# Patient Record
Sex: Male | Born: 1965
Health system: Southern US, Community
[De-identification: ages and names within clinical notes are randomized; demographics above are authoritative.]

## PROBLEM LIST (undated history)

## (undated) ENCOUNTER — Emergency Department (HOSPITAL_COMMUNITY)

## (undated) DIAGNOSIS — M1990 Unspecified osteoarthritis, unspecified site: Secondary | ICD-10-CM

## (undated) DIAGNOSIS — I509 Heart failure, unspecified: Secondary | ICD-10-CM

## (undated) DIAGNOSIS — F209 Schizophrenia, unspecified: Secondary | ICD-10-CM

## (undated) DIAGNOSIS — I10 Essential (primary) hypertension: Secondary | ICD-10-CM

## (undated) DIAGNOSIS — F319 Bipolar disorder, unspecified: Secondary | ICD-10-CM

## (undated) DIAGNOSIS — E669 Obesity, unspecified: Secondary | ICD-10-CM

## (undated) DIAGNOSIS — H3410 Central retinal artery occlusion, unspecified eye: Secondary | ICD-10-CM

## (undated) DIAGNOSIS — N189 Chronic kidney disease, unspecified: Secondary | ICD-10-CM

## (undated) HISTORY — DX: Heart failure, unspecified: I50.9

## (undated) HISTORY — DX: Unspecified osteoarthritis, unspecified site: M19.90

## (undated) HISTORY — DX: Obesity, unspecified: E66.9

## (undated) HISTORY — PX: OTHER SURGICAL HISTORY: SHX169

## (undated) HISTORY — DX: Essential (primary) hypertension: I10

## (undated) HISTORY — PX: TOTAL HIP ARTHROPLASTY: SHX124

## (undated) HISTORY — DX: Central retinal artery occlusion, unspecified eye: H34.10

## (undated) HISTORY — PX: JOINT REPLACEMENT: SHX530

---

## 1999-12-24 ENCOUNTER — Encounter: Payer: Self-pay | Admitting: Orthopedic Surgery

## 1999-12-27 ENCOUNTER — Encounter: Payer: Self-pay | Admitting: Orthopedic Surgery

## 1999-12-28 ENCOUNTER — Inpatient Hospital Stay (HOSPITAL_COMMUNITY): Admission: RE | Admit: 1999-12-28 | Discharge: 1999-12-30 | Payer: Self-pay | Admitting: Orthopaedic Surgery

## 2004-11-02 ENCOUNTER — Ambulatory Visit (HOSPITAL_COMMUNITY): Admission: RE | Admit: 2004-11-02 | Discharge: 2004-11-02 | Payer: Self-pay | Admitting: Ophthalmology

## 2004-11-29 ENCOUNTER — Ambulatory Visit (HOSPITAL_COMMUNITY): Admission: RE | Admit: 2004-11-29 | Discharge: 2004-11-30 | Payer: Self-pay | Admitting: Ophthalmology

## 2004-12-13 ENCOUNTER — Emergency Department (HOSPITAL_COMMUNITY): Admission: EM | Admit: 2004-12-13 | Discharge: 2004-12-14 | Payer: Self-pay | Admitting: Emergency Medicine

## 2007-11-14 ENCOUNTER — Emergency Department (HOSPITAL_COMMUNITY): Admission: EM | Admit: 2007-11-14 | Discharge: 2007-11-14 | Payer: Self-pay | Admitting: Emergency Medicine

## 2008-12-15 ENCOUNTER — Inpatient Hospital Stay (HOSPITAL_COMMUNITY): Admission: EM | Admit: 2008-12-15 | Discharge: 2008-12-19 | Payer: Self-pay | Admitting: Emergency Medicine

## 2008-12-15 ENCOUNTER — Ambulatory Visit: Payer: Self-pay | Admitting: Internal Medicine

## 2008-12-16 ENCOUNTER — Encounter (INDEPENDENT_AMBULATORY_CARE_PROVIDER_SITE_OTHER): Payer: Self-pay | Admitting: Internal Medicine

## 2008-12-23 ENCOUNTER — Telehealth: Payer: Self-pay | Admitting: Internal Medicine

## 2008-12-29 ENCOUNTER — Telehealth: Payer: Self-pay | Admitting: Internal Medicine

## 2009-01-04 ENCOUNTER — Telehealth: Payer: Self-pay | Admitting: Internal Medicine

## 2009-01-12 ENCOUNTER — Encounter: Payer: Self-pay | Admitting: Nurse Practitioner

## 2009-01-12 ENCOUNTER — Ambulatory Visit: Payer: Self-pay | Admitting: Cardiovascular Disease

## 2009-01-12 DIAGNOSIS — I10 Essential (primary) hypertension: Secondary | ICD-10-CM

## 2009-01-12 DIAGNOSIS — E669 Obesity, unspecified: Secondary | ICD-10-CM | POA: Insufficient documentation

## 2009-01-12 DIAGNOSIS — I5040 Unspecified combined systolic (congestive) and diastolic (congestive) heart failure: Secondary | ICD-10-CM | POA: Insufficient documentation

## 2009-01-12 DIAGNOSIS — I509 Heart failure, unspecified: Secondary | ICD-10-CM | POA: Insufficient documentation

## 2009-01-12 DIAGNOSIS — M1990 Unspecified osteoarthritis, unspecified site: Secondary | ICD-10-CM | POA: Insufficient documentation

## 2009-01-12 LAB — CONVERTED CEMR LAB
BUN: 19 mg/dL (ref 6–23)
CO2: 34 meq/L — ABNORMAL HIGH (ref 19–32)
Calcium: 9 mg/dL (ref 8.4–10.5)
Creatinine, Ser: 1.4 mg/dL (ref 0.4–1.5)
Glucose, Bld: 71 mg/dL (ref 70–99)

## 2009-01-19 ENCOUNTER — Ambulatory Visit: Payer: Self-pay | Admitting: Family Medicine

## 2009-02-14 ENCOUNTER — Ambulatory Visit: Payer: Self-pay | Admitting: Internal Medicine

## 2009-02-14 LAB — CONVERTED CEMR LAB
AST: 25 units/L (ref 0–37)
Albumin: 4.1 g/dL (ref 3.5–5.2)
BUN: 23 mg/dL (ref 6–23)
Calcium: 9.7 mg/dL (ref 8.4–10.5)
Creatinine, Ser: 1.7 mg/dL — ABNORMAL HIGH (ref 0.4–1.5)
GFR calc non Af Amer: 46.42 mL/min (ref >60)
Pro B Natriuretic peptide (BNP): 25 pg/mL (ref 0.0–100.0)

## 2009-02-27 ENCOUNTER — Ambulatory Visit: Payer: Self-pay | Admitting: Cardiology

## 2009-02-27 ENCOUNTER — Ambulatory Visit (HOSPITAL_COMMUNITY): Admission: RE | Admit: 2009-02-27 | Discharge: 2009-02-27 | Payer: Self-pay | Admitting: Internal Medicine

## 2009-02-27 ENCOUNTER — Telehealth: Payer: Self-pay | Admitting: Internal Medicine

## 2009-02-27 ENCOUNTER — Encounter: Payer: Self-pay | Admitting: Internal Medicine

## 2009-02-27 ENCOUNTER — Ambulatory Visit: Payer: Self-pay

## 2009-03-08 ENCOUNTER — Telehealth: Payer: Self-pay | Admitting: Internal Medicine

## 2009-03-21 ENCOUNTER — Ambulatory Visit: Payer: Self-pay | Admitting: Cardiology

## 2009-03-21 ENCOUNTER — Encounter (INDEPENDENT_AMBULATORY_CARE_PROVIDER_SITE_OTHER): Payer: Self-pay | Admitting: Nurse Practitioner

## 2009-03-21 LAB — CONVERTED CEMR LAB
Chloride: 107 meq/L (ref 96–112)
Creatinine, Ser: 1.5 mg/dL (ref 0.4–1.5)
Potassium: 3.9 meq/L (ref 3.5–5.1)
Pro B Natriuretic peptide (BNP): 37 pg/mL (ref 0.0–100.0)
Sodium: 143 meq/L (ref 135–145)

## 2009-04-11 ENCOUNTER — Ambulatory Visit: Payer: Self-pay | Admitting: Cardiology

## 2009-05-10 ENCOUNTER — Encounter (INDEPENDENT_AMBULATORY_CARE_PROVIDER_SITE_OTHER): Payer: Self-pay | Admitting: *Deleted

## 2009-07-05 ENCOUNTER — Ambulatory Visit: Payer: Self-pay | Admitting: Internal Medicine

## 2009-10-11 ENCOUNTER — Encounter (INDEPENDENT_AMBULATORY_CARE_PROVIDER_SITE_OTHER): Payer: Self-pay | Admitting: *Deleted

## 2010-06-05 NOTE — Letter (Signed)
Summary: Appointment - Missed  [facility], [place]  [address] [place]   [place], [place] [address]   Phone: [phone]  Fax: [phone]     October 11, 2009 MRN: [id number]   [place], [place]  [address]   Dear Mr. York,  Our records indicate you missed your appointment on 10/10/2009 with Dr. Haroldine Laws. It is very important that we reach you to reschedule this appointment. We look forward to participating in your health care needs. Please contact us at the number listed above at your earliest convenience to reschedule this appointment.     Sincerely,   [name] [facility] Scheduling Team

## 2010-06-05 NOTE — Assessment & Plan Note (Signed)
Summary: f71m/jss   Visit Type:  Follow-up Primary Provider:  [facility]  CC:  felling better.  History of Present Illness: Paul [name] is a 45 year old  male with h/o severe HTN, CRI, retinal stroke and CHF thought secondary to NICM due to HTN  Was admitted in August 2010 with acute HF. EF 20-25%. Agressively diuresed and BP controlled. Cath deferred due to CRI. Suggeste Myoview but patient wanted to defer due to insurance issues.   Echo in 10/10 EF 45%.   Returns for f/u. Doing very well. Remains very active. Continues to walk at [employer] for 25 minutes. Also doing some hiking. No CP/SOB.  No orthopnea/PND. Very compliant with meds. Occasional edema. Working for [employer]   Not watching diet as closely as before. Cheats occasionally. Weight up 5 pounds. Taking BP at home on occasion. BP 130-170/90-100.  BP here 132/90 manual by his cuff 146/96  Current Medications (verified): 1)  Losartan Potassium 50 Mg Tabs (Losartan Potassium) .... Take 1 Tablet By Mouth Once A Day 2)  Klor-Con M20 20 Meq Cr-Tabs (Potassium Chloride Crys Cr) .Marland Kitchen.. 1 By Mouth Daily 3)  Carvedilol 25 Mg Tabs (Carvedilol) .... Take One Tablet By Mouth Twice A Day 4)  Aspirin 81 Mg Tbec (Aspirin) .... Take One Tablet By Mouth Daily 5)  Furosemide 40 Mg Tabs (Furosemide) .... Take One Tablet By Mouth Daily. 6)  Gold Sodium Thiomalate 50 Mg/ml Soln (Gold Sodium Thiomalate) .... Three Times A Day  Allergies (verified): 1)  ! Penicillin  Past History:  Past Medical History: Last updated: 01/12/2009 1. Severe hypertension, uncontrolled.   2. Obesity.   3. History of combined central retinal artery occlusion with macular       infarction of the right eye.       a.     Status post posterior vitrectomy and photocoagulation with        insertion of a shunt in 2006.   4. Osteoarthritis status post right hip replacement in 2001.  5. Chronic Mixed Syst/Diast CHF      a. 12/2008 echo: EF 20-25%, global HK, Grade  II Diast dysfxn.     Review of Systems       As per HPI and past medical history; otherwise all systems negative.   Vital Signs:  Patient profile:   45 year old male Height:      64 inches Weight:      207 pounds BMI:     35.66 Pulse rate:   75 / minute BP sitting:   132 / 92  (left arm) Cuff size:   regular  Vitals Entered By: Mignon Pine, RMA (July 05, 2009 4:09 PM) CC: felling better Comments his BP cuff says 164/98   Physical Exam  General:  Gen: well appearing. no resp difficulty HEENT: normal except for opacification of R eye Neck: supple. no JVD. Carotids 2+ bilat; no bruits. No lymphadenopathy or thryomegaly appreciated. Cor: PMI nondisplaced. Regular rate & rhythm. No rubs, gallops, murmur. Lungs: clear Abdomen: soft, nontender, nondistended. No hepatosplenomegaly. No bruits or masses. Good bowel sounds. Extremities: no cyanosis, clubbing, rash, edema Neuro: alert & orientedx3, cranial nerves grossly intact. moves all 4 extremities w/o difficulty. affect pleasant    Impression & Recommendations:  Problem # 1:  HYPERTENSION (ICD-401.9) BP still elevated though probably a bit overestimated by his cuff. Increase losartan to 100 once daily. May benefit from spiro down the road as well.   Problem # 2:  UNSPEC COMBINED SYSTOLIC&DIASTOLIC  HEART FAILURE (ICD-428.40) Doing well. EF improving. NYHA class I. Continue current meds with increase of losartan. No role for ICD as EF > 35%.  Other Orders: EKG w/ Interpretation (93000)  Patient Instructions: 1)  Increase Losartan to 100mg  once daily  2)  Follow up in 3 months Prescriptions: LOSARTAN POTASSIUM 100 MG TABS (LOSARTAN POTASSIUM) Take 1 tablet by mouth once a day  #30 x 6   Entered by:   Kevan Rosebush, RN   Authorized by:   Jolaine Artist, MD, [facility]   Signed by:   Kevan Rosebush, RN on 07/05/2009   Method used:   Electronically to        [facility] (retail)       [phone]       [place], [place]  [address]       Ph: [phone]       Fax: [phone]   RxID:   [id number]

## 2010-06-05 NOTE — Letter (Signed)
Summary: Appointment - Reschedule  [place], [place]    Phone:   Fax:      May 10, 2009 MRN: [id number]   [address] [place], [place]  [address]   Dear Mr. Iannelli,   Due to a change in our office schedule, your appointment on  06/27/09  at  11:45           must be changed.  It is very important that we reach you to reschedule this appointment. We look forward to participating in your health care needs. Please contact us at the number listed above at your earliest convenience to reschedule this appointment.     Sincerely,    [facility] Scheduling Team

## 2010-08-11 LAB — LIPID PANEL
Cholesterol: 158 mg/dL (ref 0–200)
HDL: 33 mg/dL — ABNORMAL LOW (ref >39)
LDL Cholesterol: 86 mg/dL (ref 0–99)
Total CHOL/HDL Ratio: 4.8 RATIO
Triglycerides: 193 mg/dL — ABNORMAL HIGH (ref <150)

## 2010-08-11 LAB — BASIC METABOLIC PANEL
BUN: 19 mg/dL (ref 6–23)
CO2: 27 mEq/L (ref 19–32)
CO2: 25 mEq/L (ref 19–32)
CO2: 31 mEq/L (ref 19–32)
CO2: 33 mEq/L — ABNORMAL HIGH (ref 19–32)
CO2: 34 mEq/L — ABNORMAL HIGH (ref 19–32)
Calcium: 8.6 mg/dL (ref 8.4–10.5)
Calcium: 8.1 mg/dL — ABNORMAL LOW (ref 8.4–10.5)
Calcium: 8.3 mg/dL — ABNORMAL LOW (ref 8.4–10.5)
Calcium: 9 mg/dL (ref 8.4–10.5)
Chloride: 107 mEq/L (ref 96–112)
Chloride: 98 mEq/L (ref 96–112)
Chloride: 98 mEq/L (ref 96–112)
Creatinine, Ser: 1.69 mg/dL — ABNORMAL HIGH (ref 0.4–1.5)
Creatinine, Ser: 1.87 mg/dL — ABNORMAL HIGH (ref 0.4–1.5)
GFR calc Af Amer: 53 mL/min — ABNORMAL LOW (ref >60)
GFR calc Af Amer: 47 mL/min — ABNORMAL LOW (ref >60)
GFR calc Af Amer: 53 mL/min — ABNORMAL LOW (ref >60)
GFR calc non Af Amer: 44 mL/min — ABNORMAL LOW (ref >60)
GFR calc non Af Amer: 44 mL/min — ABNORMAL LOW (ref >60)
Glucose, Bld: 102 mg/dL — ABNORMAL HIGH (ref 70–99)
Glucose, Bld: 103 mg/dL — ABNORMAL HIGH (ref 70–99)
Glucose, Bld: 93 mg/dL (ref 70–99)
Glucose, Bld: 97 mg/dL (ref 70–99)
Potassium: 4 mEq/L (ref 3.5–5.1)
Potassium: 4.3 mEq/L (ref 3.5–5.1)
Sodium: 136 mEq/L (ref 135–145)
Sodium: 137 mEq/L (ref 135–145)
Sodium: 138 mEq/L (ref 135–145)
Sodium: 138 mEq/L (ref 135–145)

## 2010-08-11 LAB — CK TOTAL AND CKMB (NOT AT ARMC)
CK, MB: 3.5 ng/mL (ref 0.3–4.0)
Relative Index: 2.8 — ABNORMAL HIGH (ref 0.0–2.5)
Total CK: 124 U/L (ref 7–232)

## 2010-08-11 LAB — PROTEIN, URINE, 24 HOUR: Urine Total Volume-UPROT: 5500 mL

## 2010-08-11 LAB — POCT CARDIAC MARKERS
CKMB, poc: 2.2 ng/mL (ref 1.0–8.0)
Myoglobin, poc: 110 ng/mL (ref 12–200)
Troponin i, poc: <0.05 ng/mL (ref 0.00–0.09)

## 2010-08-11 LAB — C3 COMPLEMENT: C3 Complement: 151 mg/dL (ref 88–201)

## 2010-08-11 LAB — TROPONIN I: Troponin I: 0.1 ng/mL — ABNORMAL HIGH (ref 0.00–0.06)

## 2010-08-11 LAB — TSH: TSH: 1.436 u[IU]/mL (ref 0.350–4.500)

## 2010-08-11 LAB — CREATININE, URINE, 24 HOUR
Collection Interval-UCRE24: 24 hours
Creatinine, 24H Ur: 1346 mg/d (ref 800–2000)
Creatinine, Urine: 24.48 mg/dL
Urine Total Volume-UCRE24: 5500 mL

## 2010-08-11 LAB — URINALYSIS, ROUTINE W REFLEX MICROSCOPIC
Nitrite: NEGATIVE
Specific Gravity, Urine: 1.015 (ref 1.005–1.030)
Urobilinogen, UA: 1 mg/dL (ref 0.0–1.0)

## 2010-08-11 LAB — HEPATIC FUNCTION PANEL
ALT: 64 U/L — ABNORMAL HIGH (ref 0–53)
AST: 48 U/L — ABNORMAL HIGH (ref 0–37)
Indirect Bilirubin: 1.3 mg/dL — ABNORMAL HIGH (ref 0.3–0.9)
Total Protein: 5.9 g/dL — ABNORMAL LOW (ref 6.0–8.3)

## 2010-08-11 LAB — DIFFERENTIAL
Basophils Absolute: 0 10*3/uL (ref 0.0–0.1)
Basophils Relative: 0 % (ref 0–1)
Eosinophils Absolute: 0.1 10*3/uL (ref 0.0–0.7)
Eosinophils Relative: 1 % (ref 0–5)
Lymphocytes Relative: 17 % (ref 12–46)
Lymphs Abs: 1.6 10*3/uL (ref 0.7–4.0)
Monocytes Absolute: 0.9 10*3/uL (ref 0.1–1.0)
Monocytes Relative: 9 % (ref 3–12)
Neutro Abs: 6.8 10*3/uL (ref 1.7–7.7)
Neutrophils Relative %: 72 % (ref 43–77)

## 2010-08-11 LAB — CARDIAC PANEL(CRET KIN+CKTOT+MB+TROPI)
CK, MB: 2.9 ng/mL (ref 0.3–4.0)
CK, MB: 3.2 ng/mL (ref 0.3–4.0)
Relative Index: INVALID (ref 0.0–2.5)
Total CK: 98 U/L (ref 7–232)
Troponin I: 0.08 ng/mL — ABNORMAL HIGH (ref 0.00–0.06)
Troponin I: 0.08 ng/mL — ABNORMAL HIGH (ref 0.00–0.06)

## 2010-08-11 LAB — URINE MICROSCOPIC-ADD ON

## 2010-08-11 LAB — CBC
HCT: 46.2 % (ref 39.0–52.0)
Hemoglobin: 14.7 g/dL (ref 13.0–17.0)
Hemoglobin: 15.7 g/dL (ref 13.0–17.0)
MCHC: 33.9 g/dL (ref 30.0–36.0)
MCV: 85.3 fL (ref 78.0–100.0)
RBC: 5.17 MIL/uL (ref 4.22–5.81)
RDW: 18.1 % — ABNORMAL HIGH (ref 11.5–15.5)

## 2010-08-11 LAB — PROTEIN ELECTROPHORESIS, SERUM
Albumin ELP: 53.4 % — ABNORMAL LOW (ref 55.8–66.1)
Alpha-1-Globulin: 5.2 % — ABNORMAL HIGH (ref 2.9–4.9)
Gamma Globulin: 17.3 % (ref 11.1–18.8)
Total Protein ELP: 7 g/dL (ref 6.0–8.3)

## 2010-08-11 LAB — HEPATITIS B CORE ANTIBODY, TOTAL: Hep B Core Total Ab: NEGATIVE

## 2010-08-11 LAB — BRAIN NATRIURETIC PEPTIDE: Pro B Natriuretic peptide (BNP): 899 pg/mL — ABNORMAL HIGH (ref 0.0–100.0)

## 2010-08-11 LAB — C4 COMPLEMENT: Complement C4, Body Fluid: 21 mg/dL (ref 16–47)

## 2010-08-11 LAB — T4, FREE: Free T4: 1.1 ng/dL (ref 0.80–1.80)

## 2010-09-18 NOTE — H&P (Signed)
NAME:  Paul [name], Paul [name] NO.:  [id number]   MEDICAL RECORD NO.:  [id number]          PATIENT TYPE:  EMS   LOCATION:  MAJO                         FACILITY:  [facility]   PHYSICIAN:  Karlyn Agee, M.D. DATE OF BIRTH:  [birth year]   DATE OF ADMISSION:  12/14/2008  DATE OF DISCHARGE:                              HISTORY & PHYSICAL   PRIMARY CARE PHYSICIAN:  Unassigned.   CHIEF COMPLAINT:  Progressive shortness of breath.   HISTORY OF PRESENT ILLNESS:  This is a 45 year old obese Caucasian  gentleman with a history of hypertension, noncompliant with medications.  Also history of combined central retinal artery and vein occlusion in  the right eye in 2006 with avascular glaucoma of the right eye,  requiring surgical intervention, noncompliant with postoperative  medications, who complains of progressive dyspnea on exertion for the  past 2 years, paroxysmal nocturnal dyspnea for the past 6 months, such  that he has insomnia because of the difficulty lying down at night, and  he is now been having progressive lower extremity edema for the past 2  months.  It has gotten so that he cannot bend down to tie his shoes,  because of dysnoea.  His shoes do not fit well because of the swelling.  He eventually came to the emergency room to be evaluated.  The patient's  only other complaint is of a persistent dry cough.  He denies any  headache, nausea, vomiting or diarrhea.  He denies any fevers.  He  denies any urinary frequency or dysuria or change in his micturition.   PAST MEDICAL HISTORY:  1. Hypertension.  2. History of combined central retinal artery occlusion with macular      infarction of the right eye.  3. He is status post posterior vitrectomy with endolaser hand      photocoagulation of the right eye, insertion of glaucoma  Steven's      shunt into the anterior chamber by Dr. Zadie Rhine in July 2006.  4. Total right hip arthroplasty in 2001 by Dr. 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   MEDICATIONS:  None.   ALLERGIES:  PENICILLIN in childhood.   SOCIAL HISTORY:  Denies tobacco, alcohol or illicit drug use.  He is  married, works as a Furniture conservator/restorer.   FAMILY HISTORY:  Significant for hypertension and stroke in his father.  His mother died in a motor vehicle accident at age 42.  His siblings he  presumes are healthy.   REVIEW OF SYSTEMS:  On a 10-point review of systems other than noted  above, significant only for episodic tenderness in the right upper  quadrant.   PHYSICAL EXAMINATION:  An obese middle-aged Caucasian gentleman  reclining in the stretcher, somewhat tachypneic.  VITALS:  His temperature is 97.9, pulse 105, respirations 22, blood  pressure 152/100.  He is saturating at 97% on 2 liters. His left pupil  is round and reactive.  The right cornea is opaque.  There is no cervical lymphadenopathy or side thyromegaly.  No carotid  bruit.  His chest is surprisingly clear to auscultation bilaterally.  Cardiovascular  system is tachycardiac.  No murmurs heard.  His abdomen is obese, distended.  He has mild flank dullness.  He has  mild tenderness in the right upper quadrant.  Normal abdominal bowel  sounds.  EXTREMITIES: He has 3+ edema to just below his knees, and dorsalis pedis  pulses are 2+ bilaterally.  He has no bony joint deformity.  CENTRAL NERVOUS SYSTEM:  Cranial nerves III-XII are grossly intact and  has no focal lateralizing signs.   LABS:  White count is 9.5, hemoglobin 14.7, platelets 251 with a normal  differential.  B-type natriuretic peptide is 899.  His serum chemistry:  Sodium is 138, potassium 4.0, chloride 107, CO2 25, glucose 103, BUN 19,  creatinine 1.69, calcium 8.1.  Urinalysis showed clear urine with a  trace of blood, greater than 300 proteins and negative for nitrites or  leukocyte esterase.  Microscopy shows 3 to 6 white cells, 7 to 10 red  cells, and rare bacteria.    Two-view chest x-ray is reported as cardiomegaly without failure,  interstitial coarsening appears chronic, chronic degenerative changes of  the thoracic spine.   ASSESSMENT:  1. Congestive heart failure is secondary to #2.  2. Hypertension, uncontrolled.  3. Differential also includes nephrotic syndrome, given high grade      proteinuria on urinalysis.  4. Chronic renal insufficiency.   PLAN:  We will start him on intravenous Lasix with low-dose potassium  supplementation because of his renal failure.  We will also start low-  dose beta blocker.  Will choose Coreg.  Because his lungs are  surprisingly clear, will go ahead and evaluate for nephrotic syndrome.  Will get a 24-hour urine protein collection.  Will check his serum  lipids.  Get a 2-D echo done in the morning.  Because his shortness of  breath is out of proportion to the x-ray appearance of his lungs,  consideration may be given to a VQ scan in the morning but for the time  being, will check a D-dimer.  Other plans as per orders.      Karlyn Agee, M.D.  Electronically Signed     LC/MEDQ  D:  12/15/2008  T:  12/15/2008  Job:  [id number]   cc:   Teressa Lower, MD

## 2010-09-18 NOTE — Discharge Summary (Signed)
NAME:  Paul [name], Paul [name] NO.:  [id number]   MEDICAL RECORD NO.:  [id number]          PATIENT TYPE:  INP   LOCATION:  4707                         FACILITY:  [facility]   PHYSICIAN:  Jacquelynn Cree, M.D.   DATE OF BIRTH:  12-29-1965   DATE OF ADMISSION:  12/14/2008  DATE OF DISCHARGE:  12/19/2008                               DISCHARGE SUMMARY   PRIMARY CARE PHYSICIAN:  None.   The patient will be referred to [facility] for hospital followup.   CARDIOLOGIST:  Shaune Pascal. Bensimhon, MD0   DISCHARGE DIAGNOSES:  1. Acute-on-chronic combined heart failure, ejection fraction 20-25%      with grade 2 diastolic dysfunction.  2. Stage III chronic kidney disease likely secondary to hypertensive      nephropathy.  3. Accelerated hypertension.  4. Nephrotic range proteinuria.  5. Mild hypertriglyceridemia.  6. Mild obesity.  7. Retinal cerebrovascular accident in 2006.  8. Chronic interstitial lung disease.   DISCHARGE MEDICATIONS:  1. Aspirin 81 mg p.o. daily.  2. Diovan 80 mg p.o. daily.  3. Potassium chloride 20 mEq p.o. daily.  4. Lasix 40 mg p.o. daily.  5. Coreg 6.25 mg p.o. b.i.d.   CONSULTATIONS:  Shaune Pascal. Bensimhon, MD, of Cardiology.   BRIEF ADMISSION HISTORY OF PRESENT ILLNESS:  The patient is a 45-year-  old male with past medical history of uncontrolled hypertension,  noncompliant with medications, presented to the hospital with a chief  complaint of progressive dyspnea, paroxysmal nocturnal dyspnea, and  persistent dry cough.  Upon initial evaluation in the emergency  department, he was noted to be in congestive heart failure and  subsequently was referred to the Hospitalist Service for further  evaluation and treatment.  For the full details, please see the dictated  report done by Dr. Megan Salon.   PROCEDURES AND DIAGNOSTIC STUDIES:  1. Chest x-ray on December 14, 2008, showed borderline to mild      cardiomegaly.  Development of moderate-to-severe  diffuse      interstitial lung disease since examination 3 years ago.  No      localized air space pneumonia and no pleural effusions.      Interstitial pneumonitis versus edema.  2. Chest x-ray on December 14, 2008, showed cardiomegaly without      failure.  Interstitial coarsening appears chronic.  Chronic      degenerative changes of the thoracic spine.  3. Degenerative joint disease of the thoracic spine.  4. Two-dimensional echocardiogram on December 16, 2008, showed mild left      ventricular hypertrophy.  Severe systolic dysfunction with an      ejection fraction of 20-25%.  Pseudo-normal left ventricular      filling pattern with concomitant abnormal relaxation and increased      filling pressure (grade 2 diastolic dysfunction).  For the full      details, please see the dictated report.   DISCHARGE LABORATORY VALUES:  Sodium was 136, potassium 4.4, chloride  103, bicarb 27, BUN 25, creatinine 0.70, glucose 91, and calcium 8.6.  Hepatitis C antibody and hepatitis B surface antigen was negative.  BNP  was 113.  Cardiac markers were cycled q.8 h. and showed a mild troponin  I elevation of 0.08.  Rheumatoid factor was slightly elevated at 22.  C4  and C3 complement studies were normal.  Hepatitis B core antibody was  negative.  RPR was negative.  Lipids showed a cholesterol of 158,  triglycerides 193, HDL 33, and LDL 86.  A 24-hour protein was 3740 mg.  A 24-hour creatinine was 1346 mg.  Thyroid function studies were normal.   Pending laboratory evaluations include serum protein electrophoresis,  cryoglobulin evaluation, and ANA studies.   HOSPITAL COURSE:  1. Acute-on-chronic combined heart failure:  The patient was admitted      with presumptive diagnosis of heart failure given his poorly      controlled hypertension and medical nonadherence.  He had      significant crackles on initial evaluation and improved with      empiric treatment with Lasix.  Two-dimensional  echocardiography did      confirm mixed heart failure.  Because of the severity of his heart      failure and markedly reduced ejection fraction, Cardiology      consultation was requested and kindly provided by Dr. Haroldine Laws.      Dr. Haroldine Laws helped to adjust the patient's medications.  He was      put on Lasix, and angiotensin receptor blocker, and a beta-blocker      for medical management.  Dr. Haroldine Laws does recommend an outpatient      Myoview, and will see him back in followup in 2-3 weeks.  2. Stage III chronic kidney disease:  This is felt to be most likely      due to hypertensive nephropathy.  The patient will need an      outpatient referral to a nephrologist for ongoing surveillance and      medical treatment to prevent further deterioration of his renal      function.  3. Accelerated hypertension:  The patient's blood pressures were      markedly elevated on presentation with systolic pressures in the      123456 and diastolic pressures measuring up to 130.  The patient was      put on a combination of medications including Lasix and Coreg.      When his ejection fraction was noted to be markedly reduced, an ACE      inhibitor was added, but was subsequently changed to an angiotensin      receptor blocker by Cardiology.  At this point, his blood pressure      is much better controlled, and his discharge blood pressure is      133/91.  The patient has been fully instructed that he should      continue to take all his medications as prescribed to avoid any      further deterioration of his cardiac or renal function.  4. Nephrotic range proteinuria:  An extensive evaluation was      undertaken though this felt most likely that his proteinuria stems      from hypertensive nephropathy.  He should remain on angiotensin      receptor blocker therapy.  His primary care physician should follow      up on the outstanding SPEP, cryoglobulins, and ANA studies that are      pending.   5. Hypertriglyceridemia:  Mild.  The patient will continue on a low-      fat diet.  6. Obesity:  The patient was encouraged to lose weight.  7. Interstitial lung disease:  The patient's respiratory status      improved with diuresis.  He is maintaining his O2 saturations well      on room air.  8. Degenerative joint disease:  The patient was asymptomatic during      this hospital stay.   DISPOSITION:  The patient is medically stable and will be discharged  home.  We will attempt to get him a follow up appointment at [facility]  as he is uninsured at the present time.   CONDITION AT DISCHARGE:  Improved.   Time spent coordinating care for discharge and discharge instructions  equals 25 minutes.      Jacquelynn Cree, M.D.  Electronically Signed     CR/MEDQ  D:  12/19/2008  T:  12/19/2008  Job:  [id number]   cc:   Hendricks Limes. Bensimhon, MD

## 2010-09-18 NOTE — Consult Note (Signed)
NAME:  Paul [name], Paul [name] NO.:  [id number]   MEDICAL RECORD NO.:  [id number]          PATIENT TYPE:  INP   LOCATION:  4707                         FACILITY:  [facility]   PHYSICIAN:  Shaune Pascal. Bensimhon, MDDATE OF BIRTH:  [birth year]   DATE OF CONSULTATION:  12/17/2008  DATE OF DISCHARGE:                                 CONSULTATION   REQUESTING PHYSICIAN:  Jacquelynn Cree, M.D. of the [facility]  service.   REASON FOR CONSULTATION:  Acute systolic heart failure.   HISTORY OF PRESENT ILLNESS:  Paul [name] is a 45 year old male with a  history of severe hypertension, obesity and previous retinal stroke who  was admitted with a greater than 1 year history of progressive dyspnea  on exertion.  In talking to him and his wife he said that he really  began to feel short of breath over a year ago.  This has progressed over  the past month or two to the point where he has been having orthopnea  and paroxysmal nocturnal dyspnea.  He has also began to develop lower  extremity edema.  He says that he stopped taking his blood pressure  medications a while back as he tried to do it naturally using fish oil  and starting the P90X workout system.   Unfortunately he continued to get worse and was admitted to the hospital  on December 15, 2008, with significant volume overload.  Blood pressure on  admission was 152/100.  Since being admitted he has been diuresed some.  He has been found to have significant renal insufficiency with a  creatinine of 1.7 to 1.8.  Echocardiogram showed an ejection fraction of  20-25% with global hypokinesis and grade 2 diastolic dysfunction.  We  are consulted for further heart failure management.   REVIEW OF SYSTEMS:  He denies any chest pain.  He has not had any  palpitations, presyncope or syncope.  No nausea, vomiting, diarrhea or  abdominal pain.  He does have some fatigue, orthopnea and PND as  mentioned above.  Obviously he has problems with his  vision given his  previous retinal artery occlusion.  The remainder of his review of  systems are negative except for HPI and problem list.   PROBLEM LIST:  1. Severe hypertension, uncontrolled.  2. Obesity.  3. History of combined central retinal artery occlusion with macular      infarction of the right eye.      a.     Status post posterior vitrectomy and photocoagulation with       insertion of a shunt in 2006.  4. Osteoarthritis status post right hip replacement in 2001.   CURRENT MEDICATIONS:  1. Aspirin 81.  2. Clonidine 0.1 b.i.d.  3. Lovenox 40 mg a day.  4. Lasix 80 mg p.o. daily.  5. Lisinopril 10 a day.  6. Toprol 25 daily.  7. Potassium 20 b.i.d.   ALLERGIES:  History of allergy to PENICILLIN as a child.   SOCIAL HISTORY:  He is previously from [place].  He is married, works  as a Furniture conservator/restorer here in [employer] and  denies tobacco, alcohol or  illicit drug use.   FAMILY HISTORY:  Father had hypertension and a stroke.  He passed away.  Mother died in a motor vehicle accident at age 76.  He presumes his  siblings are healthy.   PHYSICAL EXAM:  GENERAL:  He is sitting up in bed in no acute distress.  VITAL SIGNS:  His respirations are unlabored.  His blood pressure is  143/91, heart rate is 90.  He is satting 95% on room air.  HEENT:  Normal except for an opacified right eye.  NECK:  Supple and thick.  Hard to see his JVP but it looks to be about  at least 10 cm of water.  Carotids are 2+ bilaterally without bruits.  There is no lymphadenopathy or thyromegaly.  CARDIAC:  PMI is laterally displaced.  He is regular with an S4, no S3,  no murmur.  LUNGS:  Lungs are clear.  ABDOMEN:  Abdomen is obese, nontender.  He is mildly distended.  No  hepatosplenomegaly.  No bruits.  No masses.  EXTREMITIES:  Warm with no cyanosis or clubbing.  He has 2+ edema and  some chronic venous stasis changes.  NEURO:  Alert and oriented x3.  Moves all 4 extremities without   difficulty.  Affect is pleasant.   Sodium 138, potassium 4.4, BUN 20, creatinine 1.8.  BNP was 492.  Troponin has been negative at 0.07.  Chest x-ray on August 11 showed  cardiomegaly without any heart failure.  There was interstitial  coarsening of the lungs.  EKG shows sinus tachycardia, 104 with biatrial  enlargement.  No significant ST-T wave abnormalities.  Echocardiogram as  above with an EF of 20-25% and normal diastolic filling pattern.   ASSESSMENT:  1. Acute mixed congestive heart failure with ejection fraction of 20-      25% likely hypertensive cardiomyopathy.  2. Severe hypertension.  3. Medical noncompliance.  4. Chronic renal insufficiency.  5. History of retinal artery occlusion.  6. Questionable ACE inhibitor cough.   PLAN AND DISCUSSION:  I suspect Paul [name]'s cardiomyopathy and renal  dysfunction are likely secondary to uncontrolled hypertension.  On exam  he still has some volume on board thus I would continue IV diuresis.  Given his LV dysfunction would stop his clonidine and titrate his  afterload reducers and beta-blockers.  Given his potential ACE inhibitor  cough we will switch him to an ARB.  Despite his cardiomyopathy I would  defer catheterization at this time as I suspect that his cardiomyopathy  is nonischemic and he is at increased risk for contrast induced  nephropathy.  We will likely consider an outpatient Myoview.   I had a long talk with Paul [name] and his wife about the etiologies of  congestive heart failure and the need for compliance with his  medications.   Total time of the consult is 50 minutes.      Shaune Pascal. Bensimhon, MD  Electronically Signed     DRB/MEDQ  D:  12/17/2008  T:  12/17/2008  Job:  [id number]

## 2010-09-21 NOTE — Discharge Summary (Signed)
[place]. [facility]  Patient:    Paul [name], Paul [name]                    MRN: [id number] Adm. Date:  [id number] Disc. Date: [id number] Attending:  [name] Dictator:   Ilona Sorrel, P.A.-C.                           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  PROCEDURES:  Right total hip arthroplasty on December 27, 1999, by Vernell Leep, M.D.  DISCHARGE MEDICATIONS: 1. Coumadin per pharmacy protocol. 2. Tylox as needed for pain. 3. All home medications as before.  DISPOSITION:  The patient was discharged home with home health nursing for dressing changes and blood draws for prothrombin times.  Physical therapy three times a week.  Follow-up appointment in 4-5 days after discharge.  HOSPITAL COURSE:  This is a 45 year old white male with bilateral hip degenerative joint disease, hip deformities from either Legg-Perthes or coxa magna, or old slipped femoral metaphysis.  He underwent successful left total hip arthroplasty in the past and is now having increasing pain on the right with decrease in range of motion.  He elected to proceed with surgery.  He was admitted and underwent right total hip arthroplasty using uncemented Osteonics components, HA type on December 27, 1999, by Dr. Eddie Dibbles.  Postoperative course was uneventful.  Pain was well controlled.  He underwent selected anterior/posterior low-dose radiation therapy through the portal around the capsule to prevent heterotopic bone.  Dr. Elba Barman from radiation oncology was consulted.  The patient received single treatment to the right hip area with the acetabulum and femoral stem component shielded.  He was anticoagulated postoperatively on Coumadin with management by pharmacy services.  He was progressing with physical therapy.  He was tolerating a diet well.  He was discharged home on postoperative day three with medications  and instructions for follow-up as above. DD:  02/13/00 TD:  02/14/00 Job: [id number]

## 2010-09-21 NOTE — Op Note (Signed)
NAME:  Paul [name], Paul [name]             ACCOUNT NO.:  [id number]   MEDICAL RECORD NO.:  [id number]          PATIENT TYPE:  OIB   LOCATION:  5708                         FACILITY:  [facility]   PHYSICIAN:  Clent Demark. Rankin, M.D.   DATE OF BIRTH:  [birth year]   DATE OF PROCEDURE:  11/29/2004  DATE OF DISCHARGE:                                 OPERATIVE REPORT   PREOPERATIVE DIAGNOSES:  1.  Central retinal vein occlusion, right eye.  2.  Avascular glaucoma, right eye, secondary to #1.   POSTOPERATIVE DIAGNOSES:  1.  Central retinal vein occlusion, right eye.  2.  Avascular glaucoma, right eye, secondary to #1.  3.  Probable combined central retinal artery occlusion with macular right      infarction.   OPERATION PERFORMED:  1.  Posterior vitrectomy with endolaser panphotocoagulation, right eye.  2.  Insertion of glaucoma seton-shunt-Baerveldt, model BG103-250 into the      anterior chamber.   SURGEON:  Clent Demark. Rankin, M.D.   ANESTHESIA:  General endotracheal.   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  The  patient understands that this is an attempt to salvage the globe, to lower  the intraocular pressure, but also to perform vitrectomy to clear to ocular  media as well as to deliver definitive panretinal photocoagulation.  The  patient understands the risks of anesthesia including the rare occurrence of  death but also to the eye including but not limited to hemorrhage,  infection, scarring, need for another surgery, no change in vision, loss of  vision and progression of disease despite intervention.  After appropriate  signed consent was obtained, the patient was taken to the operating room.   DESCRIPTION OF PROCEDURE:  In the operating room, appropriate monitoring was  followed by mild sedation.  General endotracheal  anesthesia was instituted  without difficulty.  The right periocular region was sterilely prepped and  draped in the usual ophthalmic fashion.  A lid speculum was applied.  Conjunctival peritomy was fashioned from the 8 o'clock to the 1 o'clock  position and the intermuscular septum separated in the supratemporal as well  as in the supranasal and infratemporal quadrants.  Superior and lateral  muscles were isolated on 2-0 silk ties.  At this time, the 25 gauge  vitrectomy and infusion cannula with the chandelier light sources then  placed in the infratemporal quadrant.   The superior 25 gauge trocars were then placed.  Core vitrectomy was then  begun.  Iatrogenic posterior vitreous detachment was necessary and created  without difficulty elevating anterior to equator 360 degrees.  Notable  incidental findings were that of lattice degeneration of supratemporal  quadrant.   Laser photocoagulation was placed in a confluent fashion the entire exposed  retina.   Only the fovea was spared.   Instrumentally the foveal macular region was spared.   At this time the surgical instruments were removed from the interior of the  eye.  Superior trocars were removed.  The implant which had been soaked in  bug juice was placed in the supratemporal quadrant and secured with 8-0  nylon sutures put in place.  Thereafter, the tube was measured and cut down  to appropriate size.  25 gauge MVR blade was then used to enter the anterior  chamber underneath a small leaflet of conjunctiva at the limits.   At this time Tutoplast was then used to secure over the tube.   7-0 ligature was tied around the tube.  The tube had been irrigated previous  to its use so as to allow free flow of irrigation.  8-0 nylon was also used  simply without tension just simply to prevent the tube from retracting.  At  this time MVR blade was then used to create another entry site into the  anterior chamber.  The infusion had been  turned off and removed.  BSS was  flushed in the anterior chamber  to confirm that the tube was patent.  At  this time the conjunctiva closed with 7-0 Vicryl to the limbus.   At this time injection of Decadron was placed inferiorly.  Sterile patch and  Fox shield applied to the right eye.  The patient awakened from anesthesia  without difficulty and transferred to the recovery room in good and stable  condition.      Clent Demark Rankin, M.D.  Electronically Signed     GAR/MEDQ  D:  11/29/2004  T:  11/30/2004  Job:  [id number]   cc:   Mikeal Hawthorne L. Elonda Husky, M.D.  [facility] [address], [place]  [place]  [place] [address]  Fax: [phone]

## 2010-09-21 NOTE — Op Note (Signed)
[place]. [facility]  Patient:    Paul [name], Paul [name]                    MRN: [id number] Proc. Date: 12/27/99 Adm. Date:  [id number] Attending:  [name] CC:         Vernell Leep, M.D.   Operative Report  PREOPERATIVE DIAGNOSIS:  Degenerative joint disease right hip probable ____________ or slipped capital femoral epiphysis.  POSTOPERATIVE DIAGNOSIS:  Degenerative joint disease right hip probable ____________ or slipped capital femoral epiphysis.  OPERATION PERFORMED:  Right total hip arthroplasty using uncemented Osteonics components, HA type.  SURGEON:  Vernell Leep, M.D.  ASSISTANT: 1. Monico Blitz Rhona Raider, M.D. 2. Grace Bushy. Moye, P.A.-C.  ANESTHESIA:  General endotracheal.  CULTURES:  None.  DRAINS:  Two medium Hemovacs to Autovac.  ESTIMATED BLOOD LOSS:  1000 cc.  FLUID REPLACEMENT:  Without.  PATHOLOGIC FINDINGS AND HISTORY:  Paul [name] is a 45 year old with bilateral hip degenerative joint disease and hip deformities from either leg ____________ coxa magna or old slipped capital femoral epiphysis.  In any case, he underwent successful left total hip arthroplasty in the past and now is having increasing pain on the right with decreasing range of motion.  We elected to proceed with surgery.  The hip was very contracted with a huge neck, huge posterior osteophytes and a very shallow acetabulum.  We were basically able to remove all the osteophytes, get clearance, cut down the neck to a fingerbreadth above the lesser and expose everything removing a capsule and osteophyte.  We oriented the acetabulum into a stable position reaming successively up to a 52.  We achieved excellent rim fit with a 52.  We reamed in just into the medial table superiorly good cancellous bone but not through medially.  Overall good position with anteversion and closing was done in some flexion.  The hip was stable using a 10, 14 distal diameter #10  secure fit plus HA stem.  A 28 mm +5 C taper head, a 52 mm secure fit HA cup with screw holes but no screws placed since it was stable and a 50-52 10 degree 28 mm cross fire insert.  Stability was no push pull instability in extension or flexion.  Full extension with the knee bent to 90 and internal rotation with the hip in flexion of 90 degrees up to 75 with stability with a plus 5. The reduction was somewhat difficult given the overall tightness.  He was somewhat shorter on that side to compare to the other and we think we restored his length compared to preoperatively on the table.  That is, by measuring the knees in the decubitus position.  So excellent stability was obtained with a 10 degree liner dialed at about 9 oclock.  DESCRIPTION OF PROCEDURE:  With adequate anesthesia obtained using endotracheal technique, 1 gm Ancef given IV prophylaxis and another one at the end of the case, the patient was placed in a left lateral decubitus position with the right side up.  After standard prepping and draping, hip incision was made, curvilinear based on the trochanter.  The incision was deepened sharply with the knife and hemostasis obtained using a Bovie electrocoagulator. Dissection was carried down through the fascia, the gluteus and the iliotibial band.  This exposed the short external rotators and the piriformis which were tagged.  We then cut the capsule making a flap based medially.  This was also tagged with #1 Ethibond.  We then placed retractors and made a neck cut to removed the head, then made a further neck cut to gain exposure.  Retractors were placed around the acetabulum and we then had to do a fairly extensive capsulectomy and remove the osteophytes medially especially.  Once we gained exposure we successively reamed up first deeply centrally and then widened to a 52.  The 52 fit in.  The 54 hung up on the edge, the actual acetabulum being a 53.8 with coating, so we went  with that.  We then impacted the acetabulum the appropriate anteversion and flexion and abduction and it seated and was extremely tight.  Cluster holes were used but not used for screws.  We then placed a trial insert with the 10 degrees dialed directly 9 oclock.  We then exposed the proximal femur.  We successively reamed up to a 10.  We broached to a 10 and the appropriate anteversion.  Then we used a calcar reamer, then we did the distal reaming up to a 14.  With the broach in place we did a trial, felt the plus 0 was a little loose so went to a plus 5 with flexion internal rotation up to about 75 to 80 degrees.  We then removed the trial components including the acetabular.  We exposed the acetabulum again and then placed the 10 degree 28 mm cross-fire insert and impacted it in and checked it for security and locking in.  We then exposed the proximal femur and implanted the secure fit plus HA #10-14 mm stem in the appropriate anteversion and sunk it down tightly within the femoral neck at the appropriate height.  I then placed on the #5 C-taper head, reduced the hip as above and checked it through range of motion.  Irrigation was carried out.  Capsule was then closed back to the posterior trochanter with a horizontal mattress suture of #1 Tycron.  We then placed Hemovac drains and brought them out a distal portal. These were ultimately hooked to Autovac.  The wound was then closed in layers with #1 Vicryl on the fascia, 0 and 2-0 Vicryl on the subcu and skin staples.  A bulky sterile compressive dressing was applied with knee immobilizer.  The patient having tolerated the procedure well, was awakened and taken to the recovery room in satisfactory condition to be admitted for routine postoperative care. Please note that I did check the sciatic nerve at closure and it was out of harms way.  Please note an epidural fentanyl catheter was placed postoperatively. DD:  12/27/99 TD:   12/28/99 Job: [id number]

## 2010-09-21 NOTE — Op Note (Signed)
[place]. [facility]  Patient:    MCCARTNEY, MONTOUR                    MRN: [id number] Proc. Date: 12/27/99 Adm. Date:  [id number] Attending:  [name] CC:         Leda Quail, M.D.   Operative Report  PROCEDURE PERFORMED:  Epidural catheter placement for postoperative epidural fentanyl pain control.  The risks and benefits of postoperative epidural fentanyl pain control were not discussed with the patient preoperatively; however, intraoperatively, the patients primary surgeon, Dr. Maia Breslow requested than an epidural be placed for postoperative pain control and felt that the patient would greatly benefit from that mode of pain control.  Since the patient was small and it was felt that the epidural could be placed easily, I elected to proceed with the technique in consultation with his primary surgeon.  DESCRIPTION OF PROCEDURE:  After the surgical dressing was applied, the patient was turned to the full left lateral decubitus position.  Betadine prep x 3 was performed on the lumbar region.  Sterile technique was used.  A #17 gauge Tuohy needle was inserted in the L3-4 interspace.  Using air loss of resistance and a midline approach, the epidural space was easily located on the first pass.  There was no blood or CSF noted.  A nonstiletted Buerhenne catheter was then placed 5 cm into the epidural space.  The needle was removed.  There was again negative aspiration for blood or CSF.  A mixture of preservative free Xylocaine 1% 5 cc in 0.9 normal saline 10 cc and 100 mcg of fentanyl was then slowly injected into the epidural catheter.  The catheter was securely adhesed to the patients back using Hypafix dressing over a 2 x 2 sterile gauze at the insertion site.  After the patient was delivered in good condition in the recovery room, he was placed on a continuous infusion of 5 mg per cc epidural fentanyl and 1/16% Marcaine at initial starting rate  of 16 cc per hour.  He will be evaluated over the next 48 hours for adequacy of pain control. DD:  12/27/99 TD:  12/28/99 Job: [id number]

## 2010-11-01 ENCOUNTER — Encounter: Payer: Self-pay | Admitting: Internal Medicine

## 2011-01-31 LAB — POCT I-STAT, CHEM 8
BUN: 15
Calcium, Ion: 1.11 — ABNORMAL LOW
Chloride: 105
Creatinine, Ser: 1.4
Glucose, Bld: 89
HCT: 42
Hemoglobin: 14.3
Potassium: 4.1
Sodium: 139
TCO2: 27

## 2011-01-31 LAB — POCT CARDIAC MARKERS
CKMB, poc: 3.6
CKMB, poc: 3.7
Myoglobin, poc: 121
Myoglobin, poc: 133
Operator id: 294521
Operator id: 294521
Troponin i, poc: <0.05
Troponin i, poc: <0.05

## 2011-08-26 ENCOUNTER — Emergency Department (HOSPITAL_COMMUNITY)
Admission: EM | Admit: 2011-08-26 | Discharge: 2011-08-26 | Disposition: A | Discharge status: Home / Self Care | Payer: Self-pay | Attending: Emergency Medicine | Admitting: Emergency Medicine

## 2011-08-26 ENCOUNTER — Emergency Department (HOSPITAL_COMMUNITY): Payer: Self-pay

## 2011-08-26 ENCOUNTER — Encounter (HOSPITAL_COMMUNITY): Payer: Self-pay | Admitting: Emergency Medicine

## 2011-08-26 DIAGNOSIS — Z79899 Other long term (current) drug therapy: Secondary | ICD-10-CM | POA: Insufficient documentation

## 2011-08-26 DIAGNOSIS — M1610 Unilateral primary osteoarthritis, unspecified hip: Secondary | ICD-10-CM | POA: Insufficient documentation

## 2011-08-26 DIAGNOSIS — M169 Osteoarthritis of hip, unspecified: Secondary | ICD-10-CM | POA: Insufficient documentation

## 2011-08-26 DIAGNOSIS — T887XXA Unspecified adverse effect of drug or medicament, initial encounter: Secondary | ICD-10-CM | POA: Insufficient documentation

## 2011-08-26 DIAGNOSIS — I10 Essential (primary) hypertension: Secondary | ICD-10-CM | POA: Insufficient documentation

## 2011-08-26 DIAGNOSIS — T434X5A Adverse effect of butyrophenone and thiothixene neuroleptics, initial encounter: Secondary | ICD-10-CM | POA: Insufficient documentation

## 2011-08-26 DIAGNOSIS — E669 Obesity, unspecified: Secondary | ICD-10-CM | POA: Insufficient documentation

## 2011-08-26 DIAGNOSIS — I509 Heart failure, unspecified: Secondary | ICD-10-CM | POA: Insufficient documentation

## 2011-08-26 DIAGNOSIS — R350 Frequency of micturition: Secondary | ICD-10-CM | POA: Insufficient documentation

## 2011-08-26 LAB — DIFFERENTIAL
Basophils Absolute: 0.1 10*3/uL (ref 0.0–0.1)
Basophils Relative: 1 % (ref 0–1)
Eosinophils Relative: 2 % (ref 0–5)
Lymphocytes Relative: 24 % (ref 12–46)
Monocytes Absolute: 0.7 10*3/uL (ref 0.1–1.0)

## 2011-08-26 LAB — CBC
HCT: 42.9 % (ref 39.0–52.0)
MCHC: 34.7 g/dL (ref 30.0–36.0)
MCV: 81.3 fL (ref 78.0–100.0)
Platelets: 266 10*3/uL (ref 150–400)
RDW: 14.4 % (ref 11.5–15.5)
WBC: 7.6 10*3/uL (ref 4.0–10.5)

## 2011-08-26 LAB — POCT I-STAT, CHEM 8
Calcium, Ion: 1.17 mmol/L (ref 1.12–1.32)
HCT: 45 % (ref 39.0–52.0)
Hemoglobin: 15.3 g/dL (ref 13.0–17.0)
Sodium: 141 mEq/L (ref 135–145)
TCO2: 26 mmol/L (ref 0–100)

## 2011-08-26 MED ORDER — CARVEDILOL 12.5 MG PO TABS: 12.5000 mg | ORAL_TABLET | Freq: Two times a day (BID) | ORAL | Status: DC

## 2011-08-26 MED ORDER — LOSARTAN POTASSIUM 50 MG PO TABS: 50.0000 mg | ORAL_TABLET | Freq: Every day | ORAL | Status: DC

## 2011-08-26 NOTE — ED Notes (Signed)
Pt states he "blanks out" forgets what hes doing, finds himself staring into space and "shakes" himself awake. Pt denies CP/SOB or any pain. Pt states his haldol dose was increased end of March to 2mg  and wonders if that is causing it.

## 2011-08-26 NOTE — Discharge Instructions (Signed)
It is very important that you follow up with your psychiatrist as soon as possible to discuss your medications.  DO NOT ride your scooter, operate any heavy machinery, or do anything that might become dangerous if you "blank out," until you have seen and been cleared by your psychiatrist.  Paul [name] may also call the neurology group listed above for an appointment.  Please follow up with your cardiologist to further manage your high blood pressure.  You may return to the ER at any time for worsening condition or any new symptoms that concern you.     Hypertension Information As your heart beats, it forces blood through your arteries. This force is your blood pressure. If the pressure is too high, it is called hypertension (HTN) or high blood pressure. HTN is dangerous because you may have it and not know it. High blood pressure may mean that your heart has to work harder to pump blood. Your arteries may be narrow or stiff. The extra work puts you at risk for heart disease, stroke, and other problems.  Blood pressure consists of two numbers, a higher number over a lower, 110/72, for example. It is stated as "110 over 72." The ideal is below 120 for the top number (systolic) and under 80 for the bottom (diastolic).  You should pay close attention to your blood pressure if you have certain conditions such as:  Heart failure.   Prior heart attack.   Diabetes   Chronic kidney disease.   Prior stroke.   Multiple risk factors for heart disease.  To see if you have HTN, your blood pressure should be measured while you are seated with your arm held at the level of the heart. It should be measured at least twice. A one-time elevated blood pressure reading (especially in the Emergency Department) does not mean that you need treatment. There may be conditions in which the blood pressure is different between your right and left arms. It is important to see your caregiver soon for a recheck. Most people have  essential hypertension which means that there is not a specific cause. This type of high blood pressure may be lowered by changing lifestyle factors such as:  Stress.   Smoking.   Lack of exercise.   Excessive weight.   Drug/tobacco/alcohol use.   Eating less salt.  Most people do not have symptoms from high blood pressure until it has caused damage to the body. Effective treatment can often prevent, delay or reduce that damage. TREATMENT  Treatment for high blood pressure, when a cause has been identified, is directed at the cause. There are a large number of medications to treat HTN. These fall into several categories, and your caregiver will help you select the medicines that are best for you. Medications may have side effects. You should review side effects with your caregiver. If your blood pressure stays high after you have made lifestyle changes or started on medicines,   Your medication(s) may need to be changed.   Other problems may need to be addressed.   Be certain you understand your prescriptions, and know how and when to take your medicine.   Be sure to follow up with your caregiver within the time frame advised (usually within two weeks) to have your blood pressure rechecked and to review your medications.   If you are taking more than one medicine to lower your blood pressure, make sure you know how and at what times they should be taken. Taking two  medicines at the same time can result in blood pressure that is too low.  Document Released: 06/25/2005 Document Revised: 01/02/2011 Document Reviewed: 07/02/2007 [facility] Patient Information 2012 [place].  Hypertension Hypertension is another name for high blood pressure. High blood pressure may mean that your heart needs to work harder to pump blood. Blood pressure consists of two numbers, which includes a higher number over a lower number (example: 110/72). HOME CARE  Make lifestyle changes as told by your  doctor. This may include weight loss and exercise.  Take your blood pressure medicine every day.  Limit how much salt you use.  Stop smoking if you smoke.  Do not use drugs.  Talk to your doctor if you are using decongestants or birth control pills. These medicines might make blood pressure higher.  Females should not drink more than 1 alcoholic drink per day. Males should not drink more than 2 alcoholic drinks per day.  See your doctor as told.  GET HELP RIGHT AWAY IF:  You have a blood pressure reading with a top number of 180 or higher.  You get a very bad headache.  You get blurred or changing vision.  You feel confused.  You feel weak, numb, or faint.  You get chest or belly (abdominal) pain.  You throw up (vomit).  You cannot breathe very well.  MAKE SURE YOU:  Understand these instructions.  Will watch your condition.  Will get help right away if you are not doing well or get worse.  Document Released: 10/09/2007 Document Revised: 04/11/2011 Document Reviewed: 10/09/2007 [facility] Patient Information 2012 [place].

## 2011-08-26 NOTE — ED Notes (Signed)
Patient transported to CT 
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
## 2011-09-03 NOTE — ED Provider Notes (Signed)
Medical screening examination/treatment/procedure(s) were performed by non-physician practitioner and as supervising physician I was immediately available for consultation/collaboration.   Mirna Mires, MD 09/03/11 [phone]

## 2011-09-10 ENCOUNTER — Emergency Department (HOSPITAL_COMMUNITY): Payer: Self-pay

## 2011-09-10 ENCOUNTER — Observation Stay (HOSPITAL_COMMUNITY)
Admission: EM | Admit: 2011-09-10 | Discharge: 2011-09-12 | Disposition: A | Discharge status: Home / Self Care | Payer: Self-pay | Attending: Internal Medicine | Admitting: Internal Medicine

## 2011-09-10 ENCOUNTER — Encounter (HOSPITAL_COMMUNITY): Payer: Self-pay | Admitting: *Deleted

## 2011-09-10 DIAGNOSIS — R55 Syncope and collapse: Principal | ICD-10-CM | POA: Diagnosis present

## 2011-09-10 DIAGNOSIS — G9340 Encephalopathy, unspecified: Secondary | ICD-10-CM | POA: Insufficient documentation

## 2011-09-10 DIAGNOSIS — S63601A Unspecified sprain of right thumb, initial encounter: Secondary | ICD-10-CM | POA: Diagnosis present

## 2011-09-10 DIAGNOSIS — F319 Bipolar disorder, unspecified: Secondary | ICD-10-CM | POA: Insufficient documentation

## 2011-09-10 DIAGNOSIS — I509 Heart failure, unspecified: Secondary | ICD-10-CM | POA: Insufficient documentation

## 2011-09-10 DIAGNOSIS — I5042 Chronic combined systolic (congestive) and diastolic (congestive) heart failure: Secondary | ICD-10-CM

## 2011-09-10 DIAGNOSIS — M1990 Unspecified osteoarthritis, unspecified site: Secondary | ICD-10-CM | POA: Diagnosis present

## 2011-09-10 DIAGNOSIS — Z96649 Presence of unspecified artificial hip joint: Secondary | ICD-10-CM | POA: Insufficient documentation

## 2011-09-10 DIAGNOSIS — I129 Hypertensive chronic kidney disease with stage 1 through stage 4 chronic kidney disease, or unspecified chronic kidney disease: Secondary | ICD-10-CM | POA: Insufficient documentation

## 2011-09-10 DIAGNOSIS — F250 Schizoaffective disorder, bipolar type: Secondary | ICD-10-CM | POA: Diagnosis present

## 2011-09-10 DIAGNOSIS — N183 Chronic kidney disease, stage 3 unspecified: Secondary | ICD-10-CM | POA: Diagnosis present

## 2011-09-10 DIAGNOSIS — H349 Unspecified retinal vascular occlusion: Secondary | ICD-10-CM | POA: Insufficient documentation

## 2011-09-10 DIAGNOSIS — I10 Essential (primary) hypertension: Secondary | ICD-10-CM | POA: Diagnosis present

## 2011-09-10 DIAGNOSIS — F209 Schizophrenia, unspecified: Secondary | ICD-10-CM | POA: Diagnosis present

## 2011-09-10 DIAGNOSIS — E669 Obesity, unspecified: Secondary | ICD-10-CM | POA: Diagnosis present

## 2011-09-10 DIAGNOSIS — I5040 Unspecified combined systolic (congestive) and diastolic (congestive) heart failure: Secondary | ICD-10-CM | POA: Diagnosis present

## 2011-09-10 DIAGNOSIS — S6390XA Sprain of unspecified part of unspecified wrist and hand, initial encounter: Secondary | ICD-10-CM | POA: Insufficient documentation

## 2011-09-10 DIAGNOSIS — H5440 Blindness, one eye, unspecified eye: Secondary | ICD-10-CM | POA: Insufficient documentation

## 2011-09-10 DIAGNOSIS — X58XXXA Exposure to other specified factors, initial encounter: Secondary | ICD-10-CM | POA: Insufficient documentation

## 2011-09-10 HISTORY — DX: Schizophrenia, unspecified: F20.9

## 2011-09-10 HISTORY — DX: Bipolar disorder, unspecified: F31.9

## 2011-09-10 LAB — CARDIAC PANEL(CRET KIN+CKTOT+MB+TROPI)
CK, MB: 2.7 ng/mL (ref 0.3–4.0)
CK, MB: 2.5 ng/mL (ref 0.3–4.0)
Troponin I: <0.3 ng/mL (ref <0.30)

## 2011-09-10 LAB — CBC
Platelets: 246 10*3/uL (ref 150–400)
RBC: 4.84 MIL/uL (ref 4.22–5.81)
RDW: 14.5 % (ref 11.5–15.5)
WBC: 7.9 10*3/uL (ref 4.0–10.5)

## 2011-09-10 LAB — POCT I-STAT, CHEM 8
BUN: 24 mg/dL — ABNORMAL HIGH (ref 6–23)
Calcium, Ion: 1.17 mmol/L (ref 1.12–1.32)
Chloride: 107 mEq/L (ref 96–112)
HCT: 40 % (ref 39.0–52.0)
Potassium: 4.9 mEq/L (ref 3.5–5.1)
Sodium: 139 mEq/L (ref 135–145)

## 2011-09-10 LAB — POCT I-STAT TROPONIN I: Troponin i, poc: 0.02 ng/mL (ref 0.00–0.08)

## 2011-09-10 MED ORDER — ACETAMINOPHEN 325 MG PO TABS: 650.0000 mg | ORAL_TABLET | Freq: Four times a day (QID) | ORAL | Status: DC | PRN

## 2011-09-10 MED ORDER — ONDANSETRON HCL 4 MG/2ML IJ SOLN: 4.0000 mg | Freq: Four times a day (QID) | INTRAMUSCULAR | Status: DC | PRN

## 2011-09-10 MED ORDER — HYDROCODONE-ACETAMINOPHEN 5-325 MG PO TABS: 1.0000 | ORAL_TABLET | ORAL | Status: DC | PRN

## 2011-09-10 MED ORDER — LISINOPRIL 2.5 MG PO TABS
2.5000 mg | ORAL_TABLET | Freq: Every day | ORAL | Status: DC
  Administered 2011-09-10 – 2011-09-11 (×2): 2.5 mg via ORAL
  Filled 2011-09-10 (×2): qty 1

## 2011-09-10 MED ORDER — CARVEDILOL 12.5 MG PO TABS
12.5000 mg | ORAL_TABLET | Freq: Two times a day (BID) | ORAL | Status: DC
  Administered 2011-09-11 – 2011-09-12 (×3): 12.5 mg via ORAL
  Filled 2011-09-10 (×6): qty 1

## 2011-09-10 MED ORDER — ACETAMINOPHEN 650 MG RE SUPP: 650.0000 mg | Freq: Four times a day (QID) | RECTAL | Status: DC | PRN

## 2011-09-10 MED ORDER — ONDANSETRON HCL 4 MG PO TABS: 4.0000 mg | ORAL_TABLET | Freq: Four times a day (QID) | ORAL | Status: DC | PRN

## 2011-09-10 MED ORDER — ENOXAPARIN SODIUM 40 MG/0.4ML ~~LOC~~ SOLN
40.0000 mg | SUBCUTANEOUS | Status: DC
  Administered 2011-09-10 – 2011-09-11 (×2): 40 mg via SUBCUTANEOUS
  Filled 2011-09-10 (×3): qty 0.4

## 2011-09-10 MED ORDER — TETANUS-DIPHTH-ACELL PERTUSSIS 5-2.5-18.5 LF-MCG/0.5 IM SUSP
0.5000 mL | Freq: Once | INTRAMUSCULAR | Status: AC
  Administered 2011-09-10: 0.5 mL via INTRAMUSCULAR
  Filled 2011-09-10: qty 0.5

## 2011-09-10 MED ORDER — SODIUM CHLORIDE 0.9 % IV SOLN
INTRAVENOUS | Status: DC
  Administered 2011-09-11: 12:00:00 via INTRAVENOUS

## 2011-09-10 MED ORDER — PAROXETINE HCL 20 MG PO TABS
20.0000 mg | ORAL_TABLET | Freq: Every day | ORAL | Status: DC
  Administered 2011-09-11 – 2011-09-12 (×2): 20 mg via ORAL
  Filled 2011-09-10 (×2): qty 1

## 2011-09-10 NOTE — Progress Notes (Signed)
Pt has been previously followed by Dr Glori Bickers and [facility] ( who is no longer there)
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
## 2011-09-10 NOTE — Progress Notes (Signed)
CM spoke with K[facility] at [facility] [phone] who confirms pt is active and has an appointment on Friday May 10 with Dr Redmond Pulling

## 2011-09-11 ENCOUNTER — Observation Stay (HOSPITAL_COMMUNITY)
Admit: 2011-09-11 | Discharge: 2011-09-11 | Disposition: A | Discharge status: Home / Self Care | Payer: Self-pay | Attending: Internal Medicine | Admitting: Internal Medicine

## 2011-09-11 ENCOUNTER — Observation Stay (HOSPITAL_COMMUNITY): Payer: Self-pay

## 2011-09-11 DIAGNOSIS — N183 Chronic kidney disease, stage 3 (moderate): Secondary | ICD-10-CM

## 2011-09-11 DIAGNOSIS — R55 Syncope and collapse: Secondary | ICD-10-CM

## 2011-09-11 DIAGNOSIS — I10 Essential (primary) hypertension: Secondary | ICD-10-CM

## 2011-09-11 DIAGNOSIS — I5042 Chronic combined systolic (congestive) and diastolic (congestive) heart failure: Secondary | ICD-10-CM

## 2011-09-11 LAB — CBC
MCH: 28.6 pg (ref 26.0–34.0)
MCV: 82 fL (ref 78.0–100.0)
Platelets: 230 10*3/uL (ref 150–400)
RDW: 14.7 % (ref 11.5–15.5)
WBC: 8 10*3/uL (ref 4.0–10.5)

## 2011-09-11 LAB — CARDIAC PANEL(CRET KIN+CKTOT+MB+TROPI)
CK, MB: 2.2 ng/mL (ref 0.3–4.0)
Total CK: 117 U/L (ref 7–232)

## 2011-09-11 LAB — COMPREHENSIVE METABOLIC PANEL
AST: 17 U/L (ref 0–37)
Albumin: 3.4 g/dL — ABNORMAL LOW (ref 3.5–5.2)
Calcium: 8.8 mg/dL (ref 8.4–10.5)
Creatinine, Ser: 1.6 mg/dL — ABNORMAL HIGH (ref 0.50–1.35)
Total Protein: 7.4 g/dL (ref 6.0–8.3)

## 2011-09-11 LAB — PROTIME-INR: INR: 0.95 (ref 0.00–1.49)

## 2011-09-11 MED ORDER — ASPIRIN EC 81 MG PO TBEC
81.0000 mg | DELAYED_RELEASE_TABLET | Freq: Every day | ORAL | Status: DC
  Administered 2011-09-11 – 2011-09-12 (×2): 81 mg via ORAL
  Filled 2011-09-11 (×3): qty 1

## 2011-09-11 MED ORDER — LISINOPRIL 10 MG PO TABS
10.0000 mg | ORAL_TABLET | Freq: Every day | ORAL | Status: DC
  Administered 2011-09-12: 10 mg via ORAL
  Filled 2011-09-11: qty 1

## 2011-09-11 MED ORDER — SODIUM CHLORIDE 0.9 % IV SOLN
INTRAVENOUS | Status: DC
  Administered 2011-09-11: 10 mL/h via INTRAVENOUS

## 2011-09-11 MED ORDER — ISOSORB DINITRATE-HYDRALAZINE 20-37.5 MG PO TABS
1.0000 | ORAL_TABLET | Freq: Two times a day (BID) | ORAL | Status: DC
  Administered 2011-09-11 – 2011-09-12 (×2): 1 via ORAL
  Filled 2011-09-11 (×4): qty 1

## 2011-09-11 MED ORDER — HYDROCODONE-ACETAMINOPHEN 5-325 MG PO TABS: 1.0000 | ORAL_TABLET | Freq: Four times a day (QID) | ORAL | Status: DC | PRN

## 2011-09-11 NOTE — Plan of Care (Signed)
Problem: Phase I Progression Outcomes Goal: Voiding-avoid urinary catheter unless indicated Outcome: Not Applicable Date Met:  [id number] Voids independently

## 2011-09-11 NOTE — Progress Notes (Signed)
   CARE MANAGEMENT NOTE 09/11/2011  Patient:  Paul [name],Paul [name]   Account Number:  [id number]  Date Initiated:  09/11/2011  Documentation initiated by:  Olga Coaster  Subjective/Objective Assessment:   ADMITTED TO OBSERVATION FOR  LOC     Action/Plan:   PATIENT IS INDEPENDENT PRIOR TO ADMISSION, GOES TO [facility]ALTH SERV[facility] FOR MEDICAL CARE   Anticipated DC Date:  09/13/2011   Anticipated DC Plan:  [place]  CM consult  [facility]              Status of service:  In process, will continue to follow Medicare Important Message given?   (If response is "NO", the following Medicare IM given date fields will be blank)  Discharge Disposition:  HOME  Per UR Regulation:  Reviewed for med. necessity/level of care/duration of stay  Comments:  09/11/2011- B Kacey Vicuna RN, BSN, MHA PATIENT HAS AN APT [place] Sep 13, 2011 WITH DR Redmond Pulling

## 2011-09-11 NOTE — Progress Notes (Addendum)
Patient A&Ox4, denies pain. Family at bedside.  Patient requesting information when CT will be done and needs help paying for medications.

## 2011-09-11 NOTE — Progress Notes (Signed)
*  PRELIMINARY RESULTS* Echocardiogram 2D Echocardiogram has been performed.  Elvia Collum 09/11/2011, 4:14 PM

## 2011-09-11 NOTE — Progress Notes (Signed)
Subjective: No acute complaints; denies CP, SOB, abdominal pain or fever.  Objective: Vital signs in last 24 hours: Temp:  [97.6 F (36.4 C)-98.2 F (36.8 C)] 98.2 F (36.8 C) (05/08 1444) Pulse Rate:  [67-112] 71  (05/08 1444) Resp:  [18-20] 20  (05/08 1444) BP: (156-194)/(95-114) 156/97 mmHg (05/08 1444) SpO2:  [96 %-100 %] 96 % (05/08 1444) Weight:  [90.1 kg (198 lb 10.2 oz)-91.3 kg (201 lb 4.5 oz)] 90.1 kg (198 lb 10.2 oz) (05/08 [id number]) Weight change:  Last BM Date: 09/11/11  Intake/Output from previous day:   Total I/O In: 240 [P.O.:240] Out: -    Physical Exam: General: Alert, awake, oriented x3, in no acute distress. HEENT: No bruits, no goiter. Heart: Regular rate and rhythm, without murmurs, rubs, gallops. Lungs: Clear to auscultation bilaterally. Abdomen: Soft, nontender, nondistended, positive bowel sounds. Extremities: No clubbing, cyanosis or edema with positive pedal pulses. Neuro: Grossly intact, nonfocal.   Lab Results: Basic Metabolic Panel:  Basename 09/11/11 0530 09/10/11 1551  NA 137 139  K 3.5 4.9  CL 102 107  CO2 25 --  GLUCOSE 93 91  BUN 16 24*  CREATININE 1.60* 1.50*  CALCIUM 8.8 --  MG -- --  PHOS -- --   Liver Function Tests:  Basename 09/11/11 0530  AST 17  ALT 13  ALKPHOS 103  BILITOT 1.1  PROT 7.4  ALBUMIN 3.4*   CBC:  Basename 09/11/11 0530 09/10/11 1551 09/10/11 1540  WBC 8.0 -- 7.9  NEUTROABS -- -- --  HGB 14.3 13.6 --  HCT 41.0 40.0 --  MCV 82.0 -- 81.4  PLT 230 -- 246   Cardiac Enzymes:  Basename 09/11/11 0530 09/10/11 2300 09/10/11 1800  CKTOTAL 117 137 140  CKMB 2.2 2.5 2.7  CKMBINDEX -- -- --  TROPONINI <0.30 <0.30 <0.30   Coagulation:  Basename 09/11/11 0530  LABPROT 12.9  INR 0.95    Studies/Results: Dg Chest 2 View  09/10/2011  *RADIOLOGY REPORT*  Clinical Data: Syncope, fall, history CHF  CHEST - 2 VIEW  Comparison: 12/14/2008  Findings: Enlargement of cardiac silhouette with pulmonary  vascular congestion. Mildly tortuous aorta. Lungs clear. No pleural effusion or pneumothorax. No acute osseous findings.  IMPRESSION: Enlargement of cardiac silhouette with pulmonary vascular congestion. No acute abnormalities.  Original Report Authenticated By: Burnetta Sabin, M.D.   Mr Brain Wo Contrast  09/11/2011  *RADIOLOGY REPORT*  Clinical Data: 46 year old male with uncontrolled hypertension. Syncope, blackouts.  Possible seizure disorder.  MRI HEAD WITHOUT CONTRAST  Technique:  Multiplanar, multiecho pulse sequences of the brain and surrounding structures were obtained according to standard protocol without intravenous contrast.  Comparison: Head CT without contrast 08/26/2011 and earlier.  Findings: No restricted diffusion to suggest acute infarction.  No midline shift, mass effect, evidence of mass lesion, ventriculomegaly, extra-axial collection or acute intracranial hemorrhage.  Cervicomedullary junction and pituitary are within normal limits.  Major intracranial vascular flow voids are preserved.  Scattered cerebral white matter T2 and FLAIR hyperintensity with some confluence of signal abnormality in the periatrial white matter.  Deep gray matter nuclei within normal limits.  Minimal to mild brain stem white matter T2 signal abnormality.  Cerebellum within normal limits.  No cortical encephalomalacia.  Mesial temporal lobe structures including hippocampal complexes are symmetric and within normal limits.  Overall cerebral volume does appear decreased for age.  Negative visualized cervical spine.  Normal bone marrow signal.  Right phthisis bulbi.  Left orbit soft tissues are within normal limits.  Minimal paranasal sinus mucosal thickening.  Mastoids are clear.  Grossly normal visualized internal auditory structures.  Minimal retained secretions in the nasopharynx.  Negative scalp soft tissues.  IMPRESSION: 1. No acute intracranial abnormality. 2.  Nonspecific generalized volume loss and moderate for  age cerebral white matter signal changes.  Differential considerations include accelerated/hereditary small vessel ischemia, sequelae of trauma, hypercoagulable state, vasculitis, migraines, prior infection or demyelination.  Original Report Authenticated By: Randall An, M.D.   Dg Hand Complete Right  09/10/2011  *RADIOLOGY REPORT*  Clinical Data: Fall, congestive heart failure  RIGHT HAND - COMPLETE 3+ VIEW  Comparison: None  Findings: No evidence of fracture dislocation of the right hand. No soft tissue abnormality.  IMPRESSION: No acute osseous abnormality.  Original Report Authenticated By: Suzy Bouchard, M.D.    Medications: Scheduled Meds:   . aspirin EC  81 mg Oral Daily  . carvedilol  12.5 mg Oral BID WC  . enoxaparin  40 mg Subcutaneous Q24H  . isosorbide-hydrALAZINE  1 tablet Oral BID  . lisinopril  10 mg Oral Daily  . PARoxetine  20 mg Oral Daily  . TDaP  0.5 mL Intramuscular Once  . DISCONTD: lisinopril  2.5 mg Oral Daily   Continuous Infusions:   . sodium chloride 10 mL/hr (09/11/11 1446)  . DISCONTD: sodium chloride 75 mL/hr at 09/11/11 1229   PRN Meds:.acetaminophen, acetaminophen, HYDROcodone-acetaminophen, ondansetron (ZOFRAN) IV, ondansetron, DISCONTD: HYDROcodone-acetaminophen  Assessment/Plan: 1-Syncope and collapse: Continue telemetry; no arrythmia identified. Patient denies any CP, SOB or further LOC. Causes includes TIA (especially with cardiovascular risk factors), Seizure (with remote hx of petit mal) and [place] medications (especially Haldol). Will continue holding haldol; wait for 2-D echo results and start low dose ASA. After discussing with neurology, plan is for outpatient EEG and further decision on antiepileptic medications. No stroke seen on MRI. Continue secondary prevention; especially better BP control.  2-HYPERTENSION: increased lisinopril to 10mg  daily and start bidil for better control. Will continue coreg.  3-UNSPEC COMBINED SYSTOLIC&DIASTOLIC  HEART FAILURE: follow 2-D echo results; continue b-blocker, start ASA and follow heart healthy diet. Currently compensated.  4-OSTEOARTHRITIS: stable; continue PRN pain meds.  5-CKD (chronic kidney disease), stage III: Cr at baseline. Will follow BMET in am; especially with antihypertensive regimen.  6-Bipolar disorder: continue paxil.  7-Sprain of right thumb: no Fx; continue pain meds and splinter.  8-DVT:lovenox.    LOS: 1 day   Dean Goldner [facility] [phone]  09/11/2011, 3:01 PM

## 2011-09-11 NOTE — Plan of Care (Signed)
Problem: Phase I Progression Outcomes Goal: Voiding-avoid urinary catheter unless indicated Outcome: Progressing Self cath

## 2011-09-12 DIAGNOSIS — N183 Chronic kidney disease, stage 3 (moderate): Secondary | ICD-10-CM

## 2011-09-12 DIAGNOSIS — I10 Essential (primary) hypertension: Secondary | ICD-10-CM

## 2011-09-12 DIAGNOSIS — I5042 Chronic combined systolic (congestive) and diastolic (congestive) heart failure: Secondary | ICD-10-CM

## 2011-09-12 DIAGNOSIS — R55 Syncope and collapse: Secondary | ICD-10-CM

## 2011-09-12 LAB — BASIC METABOLIC PANEL
CO2: 24 mEq/L (ref 19–32)
Chloride: 101 mEq/L (ref 96–112)
Creatinine, Ser: 1.61 mg/dL — ABNORMAL HIGH (ref 0.50–1.35)
Potassium: 3.7 mEq/L (ref 3.5–5.1)
Sodium: 135 mEq/L (ref 135–145)

## 2011-09-12 MED ORDER — CARVEDILOL 12.5 MG PO TABS
12.5000 mg | ORAL_TABLET | Freq: Two times a day (BID) | ORAL | Status: DC
Start: 2011-09-12 — End: 2021-06-05

## 2011-09-12 MED ORDER — LISINOPRIL 10 MG PO TABS: 10.0000 mg | ORAL_TABLET | Freq: Every day | ORAL | Status: DC

## 2011-09-12 MED ORDER — ISOSORB DINITRATE-HYDRALAZINE 20-37.5 MG PO TABS: 1.0000 | ORAL_TABLET | Freq: Two times a day (BID) | ORAL | Status: AC

## 2011-09-12 MED ORDER — ASPIRIN 81 MG PO TBEC: 81.0000 mg | DELAYED_RELEASE_TABLET | Freq: Every day | ORAL | Status: AC

## 2011-09-12 NOTE — Discharge Instructions (Signed)
Hypertension Information As your heart beats, it forces blood through your arteries. This force is your blood pressure. If the pressure is too high, it is called hypertension (HTN) or high blood pressure. HTN is dangerous because you may have it and not know it. High blood pressure may mean that your heart has to work harder to pump blood. Your arteries may be narrow or stiff. The extra work puts you at risk for heart disease, stroke, and other problems.  Blood pressure consists of two numbers, a higher number over a lower, 110/72, for example. It is stated as "110 over 72." The ideal is below 120 for the top number (systolic) and under 80 for the bottom (diastolic).  You should pay close attention to your blood pressure if you have certain conditions such as:  Heart failure.   Prior heart attack.   Diabetes   Chronic kidney disease.   Prior stroke.   Multiple risk factors for heart disease.  To see if you have HTN, your blood pressure should be measured while you are seated with your arm held at the level of the heart. It should be measured at least twice. A one-time elevated blood pressure reading (especially in the Emergency Department) does not mean that you need treatment. There may be conditions in which the blood pressure is different between your right and left arms. It is important to see your caregiver soon for a recheck. Most people have essential hypertension which means that there is not a specific cause. This type of high blood pressure may be lowered by changing lifestyle factors such as:  Stress.   Smoking.   Lack of exercise.   Excessive weight.   Drug/tobacco/alcohol use.   Eating less salt.  Most people do not have symptoms from high blood pressure until it has caused damage to the body. Effective treatment can often prevent, delay or reduce that damage. TREATMENT  Treatment for high blood pressure, when a cause has been identified, is directed at the cause. There  are a large number of medications to treat HTN. These fall into several categories, and your caregiver will help you select the medicines that are best for you. Medications may have side effects. You should review side effects with your caregiver. If your blood pressure stays high after you have made lifestyle changes or started on medicines,   Your medication(s) may need to be changed.   Other problems may need to be addressed.   Be certain you understand your prescriptions, and know how and when to take your medicine.   Be sure to follow up with your caregiver within the time frame advised (usually within two weeks) to have your blood pressure rechecked and to review your medications.   If you are taking more than one medicine to lower your blood pressure, make sure you know how and at what times they should be taken. Taking two medicines at the same time can result in blood pressure that is too low.  Document Released: 06/25/2005 Document Revised: 01/02/2011 Document Reviewed: 07/02/2007 Exit[employer] Patient Information 2012 [employer]. 
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
## 2011-09-12 NOTE — Progress Notes (Signed)
Talked to patient about DCP; patient is active with [facility] and has an appointment tomorrow 09/13/2011 with Dr Redmond Pulling. Patient goes to [facility] to have his prescriptions filled but states that he is having financial problems. Resources given to patient for needymeds.com (patient medication assistance program) and [employer]. Informed patient that these are possible resources and for him to read over the information and decide which option would be best for him. Mindi Slicker RN, BSN, [place].

## 2011-09-12 NOTE — Progress Notes (Signed)
Brief Nutrition Note  Reason: Nutrition Risk for unintentional weight loss > 10 lb over 1 month.   Patient is currently on a regular diet with PO intake documented 100 % at meals. Patient reported a UBW of 196 lb.Marland Kitchen He stated his appetite has been good. He stated he has not lost any weight recently.   Wt Readings from Last 10 Encounters:  09/12/11 200 lb 4.8 oz (90.855 kg)  07/05/09 207 lb (93.895 kg)  04/11/09 202 lb (91.627 kg)  03/21/09 204 lb (92.534 kg)  02/14/09 198 lb (89.812 kg)  01/12/09 195 lb (88.451 kg)   Patient is not at nutrition risk at this time. RD to follow for nutrition needs.  Loyce Dys [facility] [id number]
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
## 2013-04-20 ENCOUNTER — Emergency Department (HOSPITAL_COMMUNITY): Payer: 59

## 2013-04-20 ENCOUNTER — Encounter (HOSPITAL_COMMUNITY): Payer: Self-pay | Admitting: Emergency Medicine

## 2013-04-20 ENCOUNTER — Emergency Department (HOSPITAL_COMMUNITY)
Admission: EM | Admit: 2013-04-20 | Discharge: 2013-04-21 | Disposition: A | Discharge status: Home / Self Care | Payer: Self-pay | Attending: Emergency Medicine | Admitting: Emergency Medicine

## 2013-04-20 DIAGNOSIS — Z8739 Personal history of other diseases of the musculoskeletal system and connective tissue: Secondary | ICD-10-CM | POA: Insufficient documentation

## 2013-04-20 DIAGNOSIS — H539 Unspecified visual disturbance: Secondary | ICD-10-CM | POA: Insufficient documentation

## 2013-04-20 DIAGNOSIS — M549 Dorsalgia, unspecified: Secondary | ICD-10-CM | POA: Insufficient documentation

## 2013-04-20 DIAGNOSIS — Z91199 Patient's noncompliance with other medical treatment and regimen due to unspecified reason: Secondary | ICD-10-CM | POA: Insufficient documentation

## 2013-04-20 DIAGNOSIS — Z96649 Presence of unspecified artificial hip joint: Secondary | ICD-10-CM | POA: Insufficient documentation

## 2013-04-20 DIAGNOSIS — F209 Schizophrenia, unspecified: Secondary | ICD-10-CM | POA: Insufficient documentation

## 2013-04-20 DIAGNOSIS — E669 Obesity, unspecified: Secondary | ICD-10-CM | POA: Insufficient documentation

## 2013-04-20 DIAGNOSIS — Z9119 Patient's noncompliance with other medical treatment and regimen: Secondary | ICD-10-CM | POA: Insufficient documentation

## 2013-04-20 DIAGNOSIS — Z880 Allergy status to penicillin: Secondary | ICD-10-CM | POA: Insufficient documentation

## 2013-04-20 DIAGNOSIS — F319 Bipolar disorder, unspecified: Secondary | ICD-10-CM

## 2013-04-20 DIAGNOSIS — Z87891 Personal history of nicotine dependence: Secondary | ICD-10-CM | POA: Insufficient documentation

## 2013-04-20 DIAGNOSIS — Z79899 Other long term (current) drug therapy: Secondary | ICD-10-CM | POA: Insufficient documentation

## 2013-04-20 DIAGNOSIS — I509 Heart failure, unspecified: Secondary | ICD-10-CM | POA: Insufficient documentation

## 2013-04-20 DIAGNOSIS — I10 Essential (primary) hypertension: Secondary | ICD-10-CM | POA: Insufficient documentation

## 2013-04-20 DIAGNOSIS — F3160 Bipolar disorder, current episode mixed, unspecified: Secondary | ICD-10-CM

## 2013-04-20 LAB — URINALYSIS, ROUTINE W REFLEX MICROSCOPIC
Bilirubin Urine: NEGATIVE
Glucose, UA: NEGATIVE mg/dL
Ketones, ur: NEGATIVE mg/dL
Leukocytes, UA: NEGATIVE
Specific Gravity, Urine: 1.014 (ref 1.005–1.030)
pH: 6 (ref 5.0–8.0)

## 2013-04-20 LAB — CBC
HCT: 43 % (ref 39.0–52.0)
Hemoglobin: 15.1 g/dL (ref 13.0–17.0)
MCV: 82.2 fL (ref 78.0–100.0)
RBC: 5.23 MIL/uL (ref 4.22–5.81)
WBC: 8.7 10*3/uL (ref 4.0–10.5)

## 2013-04-20 LAB — RAPID URINE DRUG SCREEN, HOSP PERFORMED
Amphetamines: NOT DETECTED
Barbiturates: NOT DETECTED
Benzodiazepines: NOT DETECTED
Cocaine: NOT DETECTED

## 2013-04-20 LAB — BASIC METABOLIC PANEL
BUN: 22 mg/dL (ref 6–23)
CO2: 23 mEq/L (ref 19–32)
Chloride: 105 mEq/L (ref 96–112)
Creatinine, Ser: 1.45 mg/dL — ABNORMAL HIGH (ref 0.50–1.35)
Glucose, Bld: 86 mg/dL (ref 70–99)

## 2013-04-20 LAB — POCT I-STAT TROPONIN I

## 2013-04-20 LAB — URINE MICROSCOPIC-ADD ON

## 2013-04-20 LAB — ACETAMINOPHEN LEVEL: Acetaminophen (Tylenol), Serum: <15 ug/mL (ref 10–30)

## 2013-04-20 MED ORDER — HYDROCHLOROTHIAZIDE 12.5 MG PO CAPS
12.5000 mg | ORAL_CAPSULE | Freq: Once | ORAL | Status: AC
  Administered 2013-04-20: 12.5 mg via ORAL
  Filled 2013-04-20: qty 1

## 2013-04-20 MED ORDER — HYDROCHLOROTHIAZIDE 12.5 MG PO CAPS: 12.5000 mg | ORAL_CAPSULE | Freq: Every day | ORAL | Status: DC

## 2013-04-20 NOTE — ED Notes (Signed)
Pt states that he feels "tingly" like his blood pressure is high.  States that he has been stressed recently.

## 2013-04-20 NOTE — ED Notes (Signed)
One pair blue jeans,  [employer] t shirt, one pair off white socks,  One pair brown brogan boots,  One pair red underwear placed in locker  26   .  Locked up  With security is one black torn wallet,  2 debit cards,  One flip phone black,  Ss card,  2 driver licenses,  2 [phone].  Bills and one quarter and two pennies,  Total $40.27.  Pt signed and counted with Olivia NT and securtiy

## 2013-04-20 NOTE — ED Notes (Signed)
Pt refuses to give belongings to be locked,  States he doesn't want to speak with anyone,  Dr Nolon Bussing

## 2013-04-20 NOTE — ED Provider Notes (Signed)
CSN: [id number]     Arrival date & time 04/20/13  1758 History   First MD Initiated Contact with Patient 04/20/13 1855     Chief Complaint  Patient presents with  . Medical Clearance   (Consider location/radiation/quality/duration/timing/severity/associated sxs/prior Treatment) HPI Comments: Patient presents with tingling back pain. He feels this is secondary to stress levels. EMS for concerns of demon attacks. He also states he feels he is married to a tree which is faxing witchcraft. He also feels that there is a Restaurant manager, fast food race of superior intelligence being his IQs in the 600-700 range that manipulate thoughts. He believes these are superimposed and beings are not visible and are called "Bodysnatchers". He also thinks that Noland Fordyce, Bufford Buttner is vice president, and [employer], are attacking [place]  with invisible cancer weapons. He denies any SI/HI. He's been noncompliant with his medications for quite some time .  Patient is a 47 y.o. male presenting with hypertension. The history is provided by the patient.  Hypertension This is a chronic problem. The problem occurs constantly. The problem has not changed since onset.Pertinent negatives include no chest pain, no abdominal pain and no shortness of breath. Nothing aggravates the symptoms. Nothing relieves the symptoms. He has tried nothing for the symptoms.    Past Medical History  Diagnosis Date  . Severe uncontrolled hypertension   . Obesity   . Central retinal artery occlusion     with macular infarction of the right eye. status post posterior vitrectomy and photocoagulation with insertion of a shunt in 2006.  . Osteoarthritis     s/p right hip replacement in 2001  . CHF (congestive heart failure)     chronic mixed syst/diast. A999333 echo: Systolic function was mildly reduced. The estimated ejection fraction was in 45%, global HK, Grade I Diast dysfxn.  Marland Kitchen Heart failure   . Bipolar disorder   . Schizophrenia    Past Surgical  History  Procedure Laterality Date  . Right eye surgery      for glaucoma  . Joint replacement      right hip replacement   Family History  Problem Relation Age of Onset  . Hypertension Father   . Stroke Father    History  Substance Use Topics  . Smoking status: Former Research scientist (life sciences)  . Smokeless tobacco: Never Used     Comment: Quit smoking 20-30 years ago, smoked for 1 year.  . Alcohol Use: No    Review of Systems  Constitutional: Negative for fever.  Respiratory: Negative for cough and shortness of breath.   Cardiovascular: Negative for chest pain and leg swelling.  Gastrointestinal: Negative for vomiting and abdominal pain.  All other systems reviewed and are negative.    Allergies  Penicillins  Home Medications   Current Outpatient Rx  Name  Route  Sig  Dispense  Refill  . Magnesium Oxide (MAG-OX 400 PO)   Oral   Take 1 tablet by mouth daily.         . Multiple Vitamins-Minerals (MULTIVITAMIN WITH MINERALS) tablet   Oral   Take 1 tablet by mouth daily.          BP 173/86  Pulse 78  Temp(Src) 98 F (36.7 C) (Oral)  Resp 14  SpO2 98% Physical Exam  Nursing note and vitals reviewed. Constitutional: He is oriented to person, place, and time. He appears well-developed and well-nourished. No distress.  HENT:  Head: Normocephalic and atraumatic.  Mouth/Throat: No oropharyngeal exudate.  Eyes: EOM are normal.  Pupils are equal, round, and reactive to light.  R eye with hazy cornea, chronic  Neck: Normal range of motion. Neck supple.  Cardiovascular: Normal rate and regular rhythm.  Exam reveals no friction rub.   No murmur heard. Pulmonary/Chest: Effort normal and breath sounds normal. No respiratory distress. He has no wheezes. He has no rales.  Abdominal: He exhibits no distension. There is no tenderness. There is no rebound.  Musculoskeletal: Normal range of motion. He exhibits no edema.  Neurological: He is alert and oriented to person, place, and time.   Skin: He is not diaphoretic.    ED Course  Procedures (including critical care time) Labs Review Labs Reviewed  BASIC METABOLIC PANEL - Abnormal; Notable for the following:    Creatinine, Ser 1.45 (*)    GFR calc non Af Amer 55 (*)    GFR calc Af Amer 64 (*)    All other components within normal limits  URINALYSIS, ROUTINE W REFLEX MICROSCOPIC - Abnormal; Notable for the following:    Hgb urine dipstick TRACE (*)    Protein, ur >300 (*)    All other components within normal limits  SALICYLATE LEVEL - Abnormal; Notable for the following:    Salicylate Lvl 123456 (*)    All other components within normal limits  URINE MICROSCOPIC-ADD ON - Abnormal; Notable for the following:    Casts HYALINE CASTS (*)    All other components within normal limits  CBC  URINE RAPID DRUG SCREEN (HOSP PERFORMED)  ACETAMINOPHEN LEVEL  ETHANOL  POCT I-STAT TROPONIN I   Imaging Review Ct Head Wo Contrast  04/20/2013   CLINICAL DATA:  Hypertension.  EXAM: CT HEAD WITHOUT CONTRAST  TECHNIQUE: Contiguous axial images were obtained from the base of the skull through the vertex without intravenous contrast.  COMPARISON:  Brain MRI 09/11/2011 and head CT 08/25/2012  FINDINGS: Ventricular prominence is unchanged and likely reflective of central atrophy, advanced for age. Periventricular white matter hypodensities are unchanged and compatible with mild chronic small vessel ischemic disease. There is no evidence acute cortical infarct, mass, midline shift, intracranial hemorrhage, or extra-axial fluid collection. 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 The visualized paranasal sinuses and mastoid air cells are clear.  IMPRESSION: No evidence of acute intracranial abnormality.   Electronically Signed   By: Logan Bores   On: 04/20/2013 19:44    EKG Interpretation    Date/Time:  Tuesday April 20 2013 19:20:06 EST Ventricular Rate:  74 PR Interval:  178 QRS Duration: 110 QT Interval:  434 QTC Calculation: 481 R  Axis:   -45 Text Interpretation:  Normal sinus rhythm with sinus arrhythmia Biatrial enlargement Left axis deviation Incomplete right bundle branch block T wave abnormality, consider lateral ischemia Prolonged QT Similar to previous from 2013 Confirmed by [facility]  MD, Juana Diaz ([id number]) on 04/20/2013 7:43:05 PM            MDM   1. Schizophrenia   2. Hypertension    Patient here with schizophrenia and hypertension. Comments as above per patient and some mild paranoia stating that he doesn't want any blood in database on Cytoxan. He called EMS for possible demon attacks and also for some tingling back pain has started. He states he stressed out because he is making a [employer] video to try and get our nation back on track. He is markedly hypertensive. The noncompliant medications were quite some time. Utilizing comfortably in bed without any acute distress. Her pressure started out with time. Patient also  started on hydrochlorothiazide as he states noncompliance with BP meds. We'll start hydrocortisone to go home. Like evaluated and states overall he might be a little paranoid, he does not show any signs of pathology and he can follow up as an outpatient. I initially began IVC at Psych's recommendation, however I quickly rescinded the IVC once Psych recommended outpaitent treatment. Frederico Hamman, the Psych extender discussed this case with Dr. Sabra Heck. He is stable for discharge.    Osvaldo Shipper, MD 04/20/13 [phone]

## 2013-04-20 NOTE — ED Notes (Signed)
Report received from [place]  Pt is here for hypertension and tingling in right flank area,  Pt is alert and oriented in NAD

## 2013-04-20 NOTE — ED Notes (Signed)
Dr Dillard Essex aware

## 2013-04-21 NOTE — Consult Note (Signed)
Telepsych Consultation   Reason for Consult:  Evaluate for possible IP Tx of schizophrenia/psychosis Referring Physician:  Mingo Amber MD  Paul [name] is an 47 y.o. male.  Assessment: AXIS I:  Bipolar, mixed and Schizophrenia AXIS II:  No diagnosis AXIS III:   Past Medical History  Diagnosis Date  . Severe uncontrolled hypertension   . Obesity   . Central retinal artery occlusion     with macular infarction of the right eye. status post posterior vitrectomy and photocoagulation with insertion of a shunt in 2006.  . Osteoarthritis     s/p right hip replacement in 2001  . CHF (congestive heart failure)     chronic mixed syst/diast. A999333 echo: Systolic function was mildly reduced. The estimated ejection fraction was in 45%, global HK, Grade I Diast dysfxn.  Marland Kitchen Heart failure   . Bipolar disorder   . Schizophrenia    AXIS IV:  problems with access to health care services AXIS V:  51-60 moderate symptoms  Plan:  Patient does not meet criteria for psychiatric inpatient admission. After tele-psych assessment no lethality is appreciated, patient denies SI/HI or AVH, patient is also denying paranoia or delusional thoughts. This was also communicated to on call Dr Sabra Heck, who concurs. Will give patient information via social work to proceed with OP support services to aid in long term psychiatric treatment and support services.   Subjective:   Paul [name] is a 47 y.o. male patient presenting to the [facility] ED with CC of feeling pressure in his head. During the course of the interview between he and the EDP he reportedly referenced feeling like demons or demonic influences were the reason for his head pressure, but he realized that he's been off his BP medications for quite some time thus was seeking medical attention. The patient endorses a hx of bipolar disorder and schizophrenia but hasn't been medicated in quite some time, nor does he see a therapist or psychiatrist as an OP. The patient also  endorses a remote hx of prior admission due to psychiatric illness. When patient was questioned about his comments, he stated that he is a spiritual person and believes in demonic influences and a spiritual word.   Past Psychiatric History: Past Medical History  Diagnosis Date  . Severe uncontrolled hypertension   . Obesity   . Central retinal artery occlusion     with macular infarction of the right eye. status post posterior vitrectomy and photocoagulation with insertion of a shunt in 2006.  . Osteoarthritis     s/p right hip replacement in 2001  . CHF (congestive heart failure)     chronic mixed syst/diast. A999333 echo: Systolic function was mildly reduced. The estimated ejection fraction was in 45%, global HK, Grade I Diast dysfxn.  Marland Kitchen Heart failure   . Bipolar disorder   . Schizophrenia     reports that he has quit smoking. He has never used smokeless tobacco. He reports that he does not drink alcohol or use illicit drugs. Family History  Problem Relation Age of Onset  . Hypertension Father   . Stroke Father          Allergies:   Allergies  Allergen Reactions  . Penicillins Other (See Comments)    childhood    ACT Assessment Complete:  No:   Past Psychiatric History: Diagnosis:  Schizophrenia, hyper religious  Hospitalizations: n/a  Outpatient Care:  none  Substance Abuse Care:  n/a  Self-Mutilation:  no  Suicidal Attempts:  no  Homicidal Behaviors:  no   Violent Behaviors: no   Place of Residence: unknown Marital Status:unknown Employed/Unemployed: employed Education: unknown Family Supports:unknown Objective: Blood pressure 181/108, pulse 78, temperature 98 F (36.7 C), temperature source Oral, resp. rate 16, SpO2 98.00%.There is no weight on file to calculate BMI. Results for orders placed during the hospital encounter of 04/20/13 (from the past 72 hour(s))  POCT I-STAT TROPONIN I     Status: None   Collection Time    04/20/13  7:14 PM      Result Value  Range   Troponin i, poc 0.07  0.00 - 0.08 ng/mL   Comment 3            Comment: Due to the release kinetics of cTnI,     a negative result within the first hours     of the onset of symptoms does not rule out     myocardial infarction with certainty.     If myocardial infarction is still suspected,     repeat the test at appropriate intervals.  CBC     Status: None   Collection Time    04/20/13  7:15 PM      Result Value Range   WBC 8.7  4.0 - 10.5 K/uL   RBC 5.23  4.22 - 5.81 MIL/uL   Hemoglobin 15.1  13.0 - 17.0 g/dL   HCT 43.0  39.0 - 52.0 %   MCV 82.2  78.0 - 100.0 fL   MCH 28.9  26.0 - 34.0 pg   MCHC 35.1  30.0 - 36.0 g/dL   RDW 14.5  11.5 - 15.5 %   Platelets 263  150 - 400 K/uL  BASIC METABOLIC PANEL     Status: Abnormal   Collection Time    04/20/13  7:15 PM      Result Value Range   Sodium 139  135 - 145 mEq/L   Potassium 3.9  3.5 - 5.1 mEq/L   Chloride 105  96 - 112 mEq/L   CO2 23  19 - 32 mEq/L   Glucose, Bld 86  70 - 99 mg/dL   BUN 22  6 - 23 mg/dL   Creatinine, Ser 1.45 (*) 0.50 - 1.35 mg/dL   Calcium 9.0  8.4 - 10.5 mg/dL   GFR calc non Af Amer 55 (*) >90 mL/min   GFR calc Af Amer 64 (*) >90 mL/min   Comment: (NOTE)     The eGFR has been calculated using the CKD EPI equation.     This calculation has not been validated in all clinical situations.     eGFR's persistently <90 mL/min signify possible Chronic Kidney     Disease.  ACETAMINOPHEN LEVEL     Status: None   Collection Time    04/20/13  7:20 PM      Result Value Range   Acetaminophen (Tylenol), Serum <15.0  10 - 30 ug/mL   Comment:            THERAPEUTIC CONCENTRATIONS VARY     SIGNIFICANTLY. A RANGE OF 10-30     ug/mL MAY BE AN EFFECTIVE     CONCENTRATION FOR MANY PATIENTS.     HOWEVER, SOME ARE BEST TREATED     AT CONCENTRATIONS OUTSIDE THIS     RANGE.     ACETAMINOPHEN CONCENTRATIONS     >150 ug/mL AT 4 HOURS AFTER     INGESTION AND >50 ug/mL AT 12     HOURS AFTER INGESTION  ARE      OFTEN ASSOCIATED WITH TOXIC     REACTIONS.  SALICYLATE LEVEL     Status: Abnormal   Collection Time    04/20/13  7:20 PM      Result Value Range   Salicylate Lvl 123456 (*) 2.8 - 20.0 mg/dL  ETHANOL     Status: None   Collection Time    04/20/13  7:20 PM      Result Value Range   Alcohol, Ethyl (B) <11  0 - 11 mg/dL   Comment:            LOWEST DETECTABLE LIMIT FOR     SERUM ALCOHOL IS 11 mg/dL     FOR MEDICAL PURPOSES ONLY  URINALYSIS, ROUTINE W REFLEX MICROSCOPIC     Status: Abnormal   Collection Time    04/20/13  7:44 PM      Result Value Range   Color, Urine YELLOW  YELLOW   APPearance CLEAR  CLEAR   Specific Gravity, Urine 1.014  1.005 - 1.030   pH 6.0  5.0 - 8.0   Glucose, UA NEGATIVE  NEGATIVE mg/dL   Hgb urine dipstick TRACE (*) NEGATIVE   Bilirubin Urine NEGATIVE  NEGATIVE   Ketones, ur NEGATIVE  NEGATIVE mg/dL   Protein, ur >300 (*) NEGATIVE mg/dL   Urobilinogen, UA 0.2  0.0 - 1.0 mg/dL   Nitrite NEGATIVE  NEGATIVE   Leukocytes, UA NEGATIVE  NEGATIVE  URINE RAPID DRUG SCREEN (HOSP PERFORMED)     Status: None   Collection Time    04/20/13  7:44 PM      Result Value Range   Opiates NONE DETECTED  NONE DETECTED   Cocaine NONE DETECTED  NONE DETECTED   Benzodiazepines NONE DETECTED  NONE DETECTED   Amphetamines NONE DETECTED  NONE DETECTED   Tetrahydrocannabinol NONE DETECTED  NONE DETECTED   Barbiturates NONE DETECTED  NONE DETECTED   Comment:            DRUG SCREEN FOR MEDICAL PURPOSES     ONLY.  IF CONFIRMATION IS NEEDED     FOR ANY PURPOSE, NOTIFY LAB     WITHIN 5 DAYS.                LOWEST DETECTABLE LIMITS     FOR URINE DRUG SCREEN     Drug Class       Cutoff (ng/mL)     Amphetamine      1000     Barbiturate      200     Benzodiazepine   [id number]     Tricyclics       [id number]     Opiates          300     Cocaine          300     THC              50  URINE MICROSCOPIC-ADD ON     Status: Abnormal   Collection Time    04/20/13  7:44 PM      Result Value  Range   Squamous Epithelial / LPF RARE  RARE   Casts HYALINE CASTS (*) NEGATIVE   Labs are reviewed and are pertinent for ( no critical lab values)  No current facility-administered medications for this encounter.   Current Outpatient Prescriptions  Medication Sig Dispense Refill  . Magnesium Oxide (MAG-OX 400 PO) Take 1 tablet by mouth daily.      Marland Kitchen  Multiple Vitamins-Minerals (MULTIVITAMIN WITH MINERALS) tablet Take 1 tablet by mouth daily.      . hydrochlorothiazide (MICROZIDE) 12.5 MG capsule Take 1 capsule (12.5 mg total) by mouth daily.  30 capsule  0    Psychiatric Specialty Exam:     Blood pressure 181/108, pulse 78, temperature 98 F (36.7 C), temperature source Oral, resp. rate 16, SpO2 98.00%.There is no weight on file to calculate BMI.  General Appearance: Casual  Eye Contact::  Good  Speech:  Blocked  Volume:  Normal  Mood:  Negative  Affect:  Appropriate  Thought Process:  Circumstantial  Orientation:  Full (Time, Place, and Person)  Thought Content:  Negative  Suicidal Thoughts:  No  Homicidal Thoughts:  No  Memory:  Recent;   Good  Judgement:  Good  Insight:  Good  Psychomotor Activity:  Negative  Concentration:  Good  Recall:  Good  Akathisia:  Negative  Handed:  Right  AIMS (if indicated):     Assets:  Communication Skills  Sleep:      Treatment Plan Summary: 1) Patient is not meeting IP criteria for crises mgmt, safety and or stabilization. 2) Will give OP psychiatric resources to aid in out patient stabilization, possible implementation of psychotropic therapy to aid in stabilization and reduce sx to baseline and prevent need for future IP psychiatric admission. 3) Plan of care D/W and agreed by Sabra Heck MD  Disposition:    Paul [name],Paul [name] 04/21/2013 12:01 AM  Agree with assessment and plan Woodroe Chen. Sabra Heck, M.D.

## 2013-04-21 NOTE — ED Notes (Signed)
Pt is alert and oriented in NAD,  Pt is aware to follow up with pcp and psychiatry,  Pt verbalizes understanding,  No signature,  He doesn't want to sign.

## 2013-12-21 ENCOUNTER — Emergency Department (HOSPITAL_COMMUNITY): Payer: 59

## 2013-12-21 ENCOUNTER — Encounter (HOSPITAL_COMMUNITY): Payer: Self-pay | Admitting: Emergency Medicine

## 2013-12-21 ENCOUNTER — Emergency Department (HOSPITAL_COMMUNITY)
Admission: EM | Admit: 2013-12-21 | Discharge: 2013-12-21 | Disposition: A | Discharge status: Home / Self Care | Payer: 59 | Attending: Emergency Medicine | Admitting: Emergency Medicine

## 2013-12-21 DIAGNOSIS — Z8659 Personal history of other mental and behavioral disorders: Secondary | ICD-10-CM | POA: Insufficient documentation

## 2013-12-21 DIAGNOSIS — Z880 Allergy status to penicillin: Secondary | ICD-10-CM | POA: Insufficient documentation

## 2013-12-21 DIAGNOSIS — Y9210 Unspecified residential institution as the place of occurrence of the external cause: Secondary | ICD-10-CM | POA: Insufficient documentation

## 2013-12-21 DIAGNOSIS — S0180XA Unspecified open wound of other part of head, initial encounter: Secondary | ICD-10-CM | POA: Insufficient documentation

## 2013-12-21 DIAGNOSIS — S0990XA Unspecified injury of head, initial encounter: Secondary | ICD-10-CM | POA: Insufficient documentation

## 2013-12-21 DIAGNOSIS — W1809XA Striking against other object with subsequent fall, initial encounter: Secondary | ICD-10-CM | POA: Insufficient documentation

## 2013-12-21 DIAGNOSIS — Z79899 Other long term (current) drug therapy: Secondary | ICD-10-CM | POA: Insufficient documentation

## 2013-12-21 DIAGNOSIS — E669 Obesity, unspecified: Secondary | ICD-10-CM | POA: Insufficient documentation

## 2013-12-21 DIAGNOSIS — Z8669 Personal history of other diseases of the nervous system and sense organs: Secondary | ICD-10-CM | POA: Insufficient documentation

## 2013-12-21 DIAGNOSIS — I509 Heart failure, unspecified: Secondary | ICD-10-CM | POA: Insufficient documentation

## 2013-12-21 DIAGNOSIS — S0191XA Laceration without foreign body of unspecified part of head, initial encounter: Secondary | ICD-10-CM

## 2013-12-21 DIAGNOSIS — Y9389 Activity, other specified: Secondary | ICD-10-CM | POA: Insufficient documentation

## 2013-12-21 DIAGNOSIS — Z87891 Personal history of nicotine dependence: Secondary | ICD-10-CM | POA: Insufficient documentation

## 2013-12-21 DIAGNOSIS — Z8739 Personal history of other diseases of the musculoskeletal system and connective tissue: Secondary | ICD-10-CM | POA: Insufficient documentation

## 2013-12-21 DIAGNOSIS — I10 Essential (primary) hypertension: Secondary | ICD-10-CM | POA: Insufficient documentation

## 2013-12-21 MED ORDER — TETANUS-DIPHTH-ACELL PERTUSSIS 5-2.5-18.5 LF-MCG/0.5 IM SUSP
0.5000 mL | Freq: Once | INTRAMUSCULAR | Status: DC
Start: 2013-12-21 — End: 2013-12-21
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
## 2013-12-21 NOTE — Discharge Instructions (Signed)

## 2013-12-22 NOTE — ED Provider Notes (Signed)
This patient was seen in conjunction with the resident physician, Dr. Tamala Julian.  The documentation accurately reflects the patient's ED evaluation.  On my exam, patient was in no distress.  She is awake, alert, interacting with me appropriately. Patient had gross evidence of trauma about the left temporal area, left brow. Patient required suture repair of a left brow laceration.  I was  available for, supervised relevant portions of this procedure.  Given that the patient had a substantial amount of bleeding, and there were some concerns from this facility regarding exposure to possible infectious disease, blood was sent for evaluation. Patient was amenable to this testing.     Carmin Muskrat, MD 12/22/13 [phone]

## 2019-06-01 ENCOUNTER — Other Ambulatory Visit: Payer: Self-pay

## 2019-06-01 ENCOUNTER — Encounter (HOSPITAL_COMMUNITY): Payer: Self-pay | Admitting: Emergency Medicine

## 2019-06-01 ENCOUNTER — Ambulatory Visit (HOSPITAL_COMMUNITY)
Admission: EM | Admit: 2019-06-01 | Discharge: 2019-06-01 | Disposition: A | Discharge status: Home / Self Care | Payer: 59 | Attending: Family Medicine | Admitting: Family Medicine

## 2019-06-01 DIAGNOSIS — R040 Epistaxis: Secondary | ICD-10-CM

## 2019-06-01 DIAGNOSIS — I10 Essential (primary) hypertension: Secondary | ICD-10-CM

## 2019-06-01 MED ORDER — LISINOPRIL 20 MG PO TABS: 10.0000 mg | ORAL_TABLET | Freq: Every day | ORAL | 0 refills | Status: DC

## 2019-06-01 MED ORDER — OXYMETAZOLINE HCL 0.05 % NA SOLN
NASAL | Status: AC
  Filled 2019-06-01: qty 30

## 2019-06-01 NOTE — ED Provider Notes (Signed)
[place]   [id number] 06/01/19 Arrival Time: [id number]  ASSESSMENT & PLAN:  1. Right-sided epistaxis   2. Uncontrolled hypertension     Nosebleed has stopped with Afrin + pressure. No indication for nasal packing. Unable to identify site of bleed. Recommend that he start back on his BP medications. He agrees. Will take Afrin spray with him to use today if needed. Declines blood work at this time secondary to cost. May return here if nosebleed recurs and he cannot control.  Refilled: Meds ordered this encounter  Medications  . lisinopril (ZESTRIL) 20 MG tablet    Sig: Take 0.5 tablets (10 mg total) by mouth daily.    Dispense:  30 tablet    Refill:  0    Discussed typical duration of symptoms. OTC symptom care as needed. Ensure adequate fluid intake and rest.  Recommend to est care and recheck BP: Follow-up Information    Schedule an appointment as soon as possible for a visit  with [facility].   Contact information: [address] [id number]       Schedule an appointment as soon as possible for a visit  with Primary Care at [facility].   Specialty: Family Medicine Contact information: [address], [place] [phone]           Discharge Instructions     Your blood pressure was noted to be elevated during your visit today. Please begin taking your medications again and schedule a follow up as we discussed.  BP (!) 187/124 (BP Location: Right Arm)   Pulse 98   SpO2 100%       Reviewed expectations re: course of current medical issues. Questions answered. Outlined signs and symptoms indicating need for more acute intervention. Patient verbalized understanding. After Visit Summary given.   SUBJECTIVE: History from: patient.  Paul [name] is a 54 y.o. male who reports a R-sided nosebleed. First episode last evening; just a little;  easily controlled. H/O sporadic nosebleeds reported. No new medications. This morning R-side nosebleed started again; difficulty controlling. No nasal injury. Does admit dry nose with current dry and cold weather. Increased blood pressure noted today. Reports that he has been treated for hypertension in the past. He reports not taking medications regularly as instructed (no specific reason why), no chest pain on exertion, no dyspnea on exertion, no swelling of ankles, no orthostatic dizziness or lightheadedness, no orthopnea or paroxysmal nocturnal dyspnea, no palpitations and no intermittent claudication symptoms.   Social History   Tobacco Use  Smoking Status Former Smoker  Smokeless Tobacco Never Used  Tobacco Comment   Quit smoking 20-30 years ago, smoked for 1 year.     OBJECTIVE:  Vitals:   06/01/19 1104 06/01/19 1106  BP: (!) 173/120 (!) 187/124  Pulse: 98   SpO2: 100%     High BP measurements noted.  General appearance: alert; no distress HEENT: bright red blood in R nare; no active bleeding after Afrin and pressure; I do not want to be too aggressive with nasal exam; no bleeding from gums Neck: supple without LAD; trachea midline Lungs: unlabored respirations, symmetrical air entry; cough: absent; no respiratory distress Skin: warm and dry Psychological: alert and cooperative; normal mood and affect   Allergies  Allergen Reactions  . Penicillins Other (See Comments)    childhood    Past Medical History:  Diagnosis Date  . Bipolar disorder ([place])   .  Central retinal artery occlusion    with macular infarction of the right eye. status post posterior vitrectomy and photocoagulation with insertion of a shunt in 2006.  Marland Kitchen CHF (congestive heart failure) (HCC)    chronic mixed syst/diast. A999333 echo: Systolic function was mildly reduced. The estimated ejection fraction was in 45%, global HK, Grade I Diast dysfxn.  Marland Kitchen Heart failure   . Obesity   . Osteoarthritis    s/p  right hip replacement in 2001  . Schizophrenia ([place])   . Severe uncontrolled hypertension    Family History  Problem Relation Age of Onset  . Hypertension Father   . Stroke Father    Social History   Socioeconomic History  . Marital status: Legally Separated    Spouse name: Not on file  . Number of children: Not on file  . Years of education: Not on file  . Highest education level: Not on file  Occupational History  . Occupation: Furniture conservator/restorer  Tobacco Use  . Smoking status: Former Research scientist (life sciences)  . Smokeless tobacco: Never Used  . Tobacco comment: Quit smoking 20-30 years ago, smoked for 1 year.  Substance and Sexual Activity  . Alcohol use: No  . Drug use: No  . Sexual activity: Yes    Birth control/protection: None  Other Topics Concern  . Not on file  Social History Narrative  . Not on file   Social Determinants of Health   Financial Resource Strain:   . Difficulty of Paying Living Expenses: Not on file  Food Insecurity:   . Worried About Charity fundraiser in the Last Year: Not on file  . Ran Out of Food in the Last Year: Not on file  Transportation Needs:   . Lack of Transportation (Medical): Not on file  . Lack of Transportation (Non-Medical): Not on file  Physical Activity:   . Days of Exercise per Week: Not on file  . Minutes of Exercise per Session: Not on file  Stress:   . Feeling of Stress : Not on file  Social Connections:   . Frequency of Communication with Friends and Family: Not on file  . Frequency of Social Gatherings with Friends and Family: Not on file  . Attends Religious Services: Not on file  . Active Member of Clubs or Organizations: Not on file  . Attends Archivist Meetings: Not on file  . Marital Status: Not on file  Intimate Partner Violence:   . Fear of Current or Ex-Partner: Not on file  . Emotionally Abused: Not on file  . Physically Abused: Not on file  . Sexually Abused: Not on file            Vanessa Kick, MD  06/01/19 1126

## 2019-06-01 NOTE — Discharge Instructions (Signed)
Your blood pressure was noted to be elevated during your visit today. Please begin taking your medications again and schedule a follow up as we discussed.  BP (!) 187/124 (BP Location: Right Arm)   Pulse 98   SpO2 100%

## 2019-06-07 ENCOUNTER — Telehealth: Payer: Self-pay | Admitting: Family Medicine

## 2019-06-07 NOTE — Telephone Encounter (Signed)
Called and confirmed patients phone visit tomorrow, 06/08/2019 @ 2:50

## 2019-06-08 ENCOUNTER — Ambulatory Visit (INDEPENDENT_AMBULATORY_CARE_PROVIDER_SITE_OTHER): Payer: 59 | Admitting: Internal Medicine

## 2019-06-08 ENCOUNTER — Encounter: Payer: Self-pay | Admitting: Internal Medicine

## 2019-06-08 DIAGNOSIS — I10 Essential (primary) hypertension: Secondary | ICD-10-CM

## 2019-06-08 DIAGNOSIS — Z7689 Persons encountering health services in other specified circumstances: Secondary | ICD-10-CM

## 2019-06-08 MED ORDER — LISINOPRIL 40 MG PO TABS: 40.0000 mg | ORAL_TABLET | Freq: Every day | ORAL | 3 refills | Status: DC

## 2019-06-08 MED ORDER — METOPROLOL TARTRATE 100 MG PO TABS
100.0000 mg | ORAL_TABLET | Freq: Two times a day (BID) | ORAL | 3 refills | Status: DC
Start: 2019-06-08 — End: 2019-11-03

## 2019-06-08 NOTE — Progress Notes (Signed)
Virtual Visit via Telephone Note  I connected with CAMDIN GOODRUM, on 06/08/2019 at 2:47 PM by telephone due to the COVID-19 pandemic and verified that I am speaking with the correct person using two identifiers.   Consent: I discussed the limitations, risks, security and privacy concerns of performing an evaluation and management service by telephone and the availability of in person appointments. I also discussed with the patient that there may be a patient responsible charge related to this service. The patient expressed understanding and agreed to proceed.   Location of Patient: Home   Location of Provider: Clinic    Persons participating in Telemedicine visit: Armour Silver Claud Heide Guile Dr. Juleen China      History of Present Illness: Patient has a visit today to establish care. PMH is significant for CHF, HTN, OA, CKD, Bipolar, Schizophrenia.   Was seen at Urgent Care on 1/26 for right sided epistaxis and uncontrolled HTN. Nosebleed stopped with Afrin and pressure. Packing was not performed. Given Afrin to take as needed. Patient reports no further nose bleeds since this visit. Was recommended to start back on BP meds because BP was 170-180/120s at urgent care.   Is currently taking Lisinopril 20 mg and Metoprolol 50 mg BID. When was at urgent care had missed a few doses. Tries to adhere to medications.   Patient does not have insurance. Would like to reassess what he is taking and tweak it. Reports his blood pressure always runs high.    Past Medical History:  Diagnosis Date  . Bipolar disorder ([place])   . Central retinal artery occlusion    with macular infarction of the right eye. status post posterior vitrectomy and photocoagulation with insertion of a shunt in 2006.  Marland Kitchen CHF (congestive heart failure) (HCC)    chronic mixed syst/diast. A999333 echo: Systolic function was mildly reduced. The estimated ejection fraction was in 45%, global HK, Grade I Diast dysfxn.  Marland Kitchen  Heart failure   . Obesity   . Osteoarthritis    s/p right hip replacement in 2001  . Schizophrenia ([place])   . Severe uncontrolled hypertension    Allergies  Allergen Reactions  . Penicillins Other (See Comments)    childhood    Current Outpatient Medications on File Prior to Visit  Medication Sig Dispense Refill  . fenofibrate 54 MG tablet Take 54 mg by mouth daily.    Marland Kitchen lisinopril (ZESTRIL) 20 MG tablet Take 0.5 tablets (10 mg total) by mouth daily. 30 tablet 0  . metoprolol tartrate (LOPRESSOR) 50 MG tablet Take 50 mg by mouth 2 (two) times daily.     No current facility-administered medications on file prior to visit.    Observations/Objective: NAD. Speaking clearly.  Work of breathing normal.  Alert and oriented. Mood appropriate.   Assessment and Plan: 1. Encounter to establish care   2. Essential hypertension Very uncontrolled. Will increase Lisinopril to 40 daily and Metoprolol to 100 mg BID. Use current supply for these doses for now due to lack of insurance. Last BMET was in 2014. Will need to check kidney and electrolytes. RN BP check in one week with dose changes.  - lisinopril (ZESTRIL) 40 MG tablet; Take 1 tablet (40 mg total) by mouth daily.  Dispense: 90 tablet; Refill: 3 - metoprolol tartrate (LOPRESSOR) 100 MG tablet; Take 1 tablet (100 mg total) by mouth 2 (two) times daily.  Dispense: 180 tablet; Refill: 3 - Basic Metabolic Panel; Future    Follow Up Instructions: One week  return for labs/BP check; visit with financial counselor due to lack of insurance    I discussed the assessment and treatment plan with the patient. The patient was provided an opportunity to ask questions and all were answered. The patient agreed with the plan and demonstrated an understanding of the instructions.   The patient was advised to call back or seek an in-person evaluation if the symptoms worsen or if the condition fails to improve as anticipated.     I provided 18  minutes total of non-face-to-face time during this encounter including median intraservice time, reviewing previous notes, investigations, ordering medications, medical decision making, coordinating care and patient verbalized understanding at the end of the visit.    Phill Myron, D.O. Primary Care at [facility]  06/08/2019, 2:47 PM

## 2019-06-16 ENCOUNTER — Ambulatory Visit: Payer: Self-pay

## 2019-06-16 ENCOUNTER — Other Ambulatory Visit: Payer: Self-pay

## 2019-06-16 ENCOUNTER — Ambulatory Visit (INDEPENDENT_AMBULATORY_CARE_PROVIDER_SITE_OTHER): Payer: Self-pay

## 2019-06-16 DIAGNOSIS — I10 Essential (primary) hypertension: Secondary | ICD-10-CM

## 2019-06-16 NOTE — Progress Notes (Signed)
Patient here for BP check. Has taken BP medication today. After sitting patient's BP was 171/107, pulse 78. Spoke with provider. She states to have patient increase Metoprolol to 150 mg BID. Went over dose changes with patient. He repeated information back to me correctly. Patient declined having BMP drawn today due to concerns about cost. Laurance Flatten, CMA.

## 2019-06-16 NOTE — Patient Instructions (Addendum)
Metoprolol 100 mg tablet: take 1.5 tablets in the morning & 1.5 tablets in the evening. Continue Lisinopril 40 mg tablet once a day.

## 2019-07-06 ENCOUNTER — Telehealth: Payer: Self-pay

## 2019-07-06 NOTE — Telephone Encounter (Signed)
Called patient to do their pre-visit COVID screening.  Patient states that he needs to cancel appointment. Will call back to reschedule.

## 2019-07-07 ENCOUNTER — Ambulatory Visit: Payer: Self-pay | Admitting: Internal Medicine

## 2019-11-03 ENCOUNTER — Ambulatory Visit: Payer: Self-pay | Admitting: Internal Medicine

## 2019-11-03 VITALS — BP 187/113 | HR 97 | Temp 98.1°F | Ht 63.0 in | Wt 185.3 lb

## 2019-11-03 DIAGNOSIS — I10 Essential (primary) hypertension: Secondary | ICD-10-CM

## 2019-11-03 MED ORDER — LISINOPRIL 40 MG PO TABS: 40.0000 mg | ORAL_TABLET | Freq: Every day | ORAL | 0 refills | Status: DC

## 2019-11-03 MED ORDER — METOPROLOL TARTRATE 100 MG PO TABS: 100.0000 mg | ORAL_TABLET | Freq: Two times a day (BID) | ORAL | 0 refills | Status: DC

## 2019-11-03 MED FILL — LISINOPRIL 40 MG TABS: 40 | 30 days supply | Qty: 30 | Fill #0

## 2019-11-03 MED FILL — METOPROLOL TARTRATE 100 MG: 100 | 30 days supply | Qty: 60 | Fill #0

## 2019-11-03 NOTE — Patient Instructions (Addendum)
Thank you for allowing [place] to provide your care today. Today we discussed your blood pressure. Your blood pressure is high today (it was 187/113) likely due to not taking your medications. I sent refill for your blood pressure medications.  Please pick them up from pharmacy and take them as instructed.  Please follow up with your primary care doctor in 2 weeks. You have a primary care doctor at [facility]. Follow with or establish care for [place] if you would like to and we will be happy to provide care for you.   As always, if having severe symptoms, please seek medical attention at emergency room. Should you have any questions or concerns please call the internal medicine clinic at [phone].    Thank you!   DASH Eating Plan DASH stands for "Dietary Approaches to Stop Hypertension." The DASH eating plan is a healthy eating plan that has been shown to reduce high blood pressure (hypertension). It may also reduce your risk for type 2 diabetes, heart disease, and stroke. The DASH eating plan may also help with weight loss. What are tips for following this plan?  General guidelines  Avoid eating more than 2,300 mg (milligrams) of salt (sodium) a day. If you have hypertension, you may need to reduce your sodium intake to 1,500 mg a day.  Limit alcohol intake to no more than 1 drink a day for nonpregnant women and 2 drinks a day for men. One drink equals 12 oz of beer, 5 oz of wine, or 1 oz of hard liquor.  Work with your health care provider to maintain a healthy body weight or to lose weight. Ask what an ideal weight is for you.  Get at least 30 minutes of exercise that causes your heart to beat faster (aerobic exercise) most days of the week. Activities may include walking, swimming, or biking.  Work with your health care provider or diet and nutrition specialist (dietitian) to adjust your eating plan to your individual calorie needs. Reading food labels   Check food labels  for the amount of sodium per serving. Choose foods with less than 5 percent of the Daily Value of sodium. Generally, foods with less than 300 mg of sodium per serving fit into this eating plan.  To find whole grains, look for the word "whole" as the first word in the ingredient list. Shopping  Buy products labeled as "low-sodium" or "no salt added."  Buy fresh foods. Avoid canned foods and premade or frozen meals. Cooking  Avoid adding salt when cooking. Use salt-free seasonings or herbs instead of table salt or sea salt. Check with your health care provider or pharmacist before using salt substitutes.  Do not fry foods. Cook foods using healthy methods such as baking, boiling, grilling, and broiling instead.  Cook with heart-healthy oils, such as olive, canola, soybean, or sunflower oil. Meal planning  Eat a balanced diet that includes: ? 5 or more servings of fruits and vegetables each day. At each meal, try to fill half of your plate with fruits and vegetables. ? Up to 6-8 servings of whole grains each day. ? Less than 6 oz of lean meat, poultry, or fish each day. A 3-oz serving of meat is about the same size as a deck of cards. One egg equals 1 oz. ? 2 servings of low-fat dairy each day. ? A serving of nuts, seeds, or beans 5 times each week. ? Heart-healthy fats. Healthy fats called Omega-3 fatty acids are found in  foods such as flaxseeds and coldwater fish, like sardines, salmon, and mackerel.  Limit how much you eat of the following: ? Canned or prepackaged foods. ? Food that is high in trans fat, such as fried foods. ? Food that is high in saturated fat, such as fatty meat. ? Sweets, desserts, sugary drinks, and other foods with added sugar. ? Full-fat dairy products.  Do not salt foods before eating.  Try to eat at least 2 vegetarian meals each week.  Eat more home-cooked food and less restaurant, buffet, and fast food.  When eating at a restaurant, ask that your food  be prepared with less salt or no salt, if possible. What foods are recommended? The items listed may not be a complete list. Talk with your dietitian about what dietary choices are best for you. Grains Whole-grain or whole-wheat bread. Whole-grain or whole-wheat pasta. Brown rice. [place]row. Bulgur. Whole-grain and low-sodium cereals. Pita bread. Low-fat, low-sodium crackers. Whole-wheat flour tortillas. Vegetables Fresh or frozen vegetables (raw, steamed, roasted, or grilled). Low-sodium or reduced-sodium tomato and vegetable juice. Low-sodium or reduced-sodium tomato sauce and tomato paste. Low-sodium or reduced-sodium canned vegetables. Fruits All fresh, dried, or frozen fruit. Canned fruit in natural juice (without added sugar). Meat and other protein foods Skinless chicken or [place]. Ground chicken or [place]. Pork with fat trimmed off. Fish and seafood. Egg whites. Dried beans, peas, or lentils. Unsalted nuts, nut butters, and seeds. Unsalted canned beans. Lean cuts of beef with fat trimmed off. Low-sodium, lean deli meat. Dairy Low-fat (1%) or fat-free (skim) milk. Fat-free, low-fat, or reduced-fat cheeses. Nonfat, low-sodium ricotta or cottage cheese. Low-fat or nonfat yogurt. Low-fat, low-sodium cheese. Fats and oils Soft margarine without trans fats. Vegetable oil. Low-fat, reduced-fat, or light mayonnaise and salad dressings (reduced-sodium). Canola, safflower, olive, soybean, and sunflower oils. Avocado. Seasoning and other foods Herbs. Spices. Seasoning mixes without salt. Unsalted popcorn and pretzels. Fat-free sweets. What foods are not recommended? The items listed may not be a complete list. Talk with your dietitian about what dietary choices are best for you. Grains Baked goods made with fat, such as croissants, muffins, or some breads. Dry pasta or rice meal packs. Vegetables Creamed or fried vegetables. Vegetables in a cheese sauce. Regular canned vegetables (not  low-sodium or reduced-sodium). Regular canned tomato sauce and paste (not low-sodium or reduced-sodium). Regular tomato and vegetable juice (not low-sodium or reduced-sodium). Angie Fava. Olives. Fruits Canned fruit in a light or heavy syrup. Fried fruit. Fruit in cream or butter sauce. Meat and other protein foods Fatty cuts of meat. Ribs. Fried meat. Berniece Salines. Sausage. Bologna and other processed lunch meats. Salami. Fatback. Hotdogs. Bratwurst. Salted nuts and seeds. Canned beans with added salt. Canned or smoked fish. Whole eggs or egg yolks. Chicken or [place] with skin. Dairy Whole or 2% milk, cream, and half-and-half. Whole or full-fat cream cheese. Whole-fat or sweetened yogurt. Full-fat cheese. Nondairy creamers. Whipped toppings. Processed cheese and cheese spreads. Fats and oils Butter. Stick margarine. Lard. Shortening. Ghee. Bacon fat. Tropical oils, such as coconut, palm kernel, or palm oil. Seasoning and other foods Salted popcorn and pretzels. Onion salt, garlic salt, seasoned salt, table salt, and sea salt. Worcestershire sauce. Tartar sauce. Barbecue sauce. Teriyaki sauce. Soy sauce, including reduced-sodium. Steak sauce. Canned and packaged gravies. Fish sauce. Oyster sauce. Cocktail sauce. Horseradish that you find on the shelf. Ketchup. Mustard. Meat flavorings and tenderizers. Bouillon cubes. Hot sauce and Tabasco sauce. Premade or packaged marinades. Premade or packaged taco seasonings. Relishes. Regular  salad dressings. Where to find more information:  National Heart, Lung, and [place]: https://wilson-eaton.com/  American Heart Association: www.heart.org Summary  The DASH eating plan is a healthy eating plan that has been shown to reduce high blood pressure (hypertension). It may also reduce your risk for type 2 diabetes, heart disease, and stroke.  With the DASH eating plan, you should limit salt (sodium) intake to 2,300 mg a day. If you have hypertension, you may need to reduce  your sodium intake to 1,500 mg a day.  When on the DASH eating plan, aim to eat more fresh fruits and vegetables, whole grains, lean proteins, low-fat dairy, and heart-healthy fats.  Work with your health care provider or diet and nutrition specialist (dietitian) to adjust your eating plan to your individual calorie needs. This information is not intended to replace advice given to you by your health care provider. Make sure you discuss any questions you have with your health care provider. Document Revised: 04/04/2017 Document Reviewed: 04/15/2016 Elsevier Patient Education  2020 [employer].

## 2019-11-03 NOTE — Progress Notes (Signed)
   CC: high blood pressure  HPI:  Paul [name] is a 54 y.o. male with PMHx as documented below, presented because he is concern about his high blood pressure. Please refer to problem based charting for further details and assessment and plan of current problem and chronic medical conditions.   Past Medical History:  Diagnosis Date  . Bipolar disorder ([place])   . Central retinal artery occlusion    with macular infarction of the right eye. status post posterior vitrectomy and photocoagulation with insertion of a shunt in 2006.  Marland Kitchen CHF (congestive heart failure) (HCC)    chronic mixed syst/diast. 53/2992 echo: Systolic function was mildly reduced. The estimated ejection fraction was in 45%, global HK, Grade I Diast dysfxn.  Marland Kitchen Heart failure   . Obesity   . Osteoarthritis    s/p right hip replacement in 2001  . Schizophrenia ([place])   . Severe uncontrolled hypertension    Review of Systems:  Constitutional: Negative for chills and fever.  Respiratory: Positive for mild DOE  Cardiovascular: Negative for chest pain and leg swelling.  Gastrointestinal: Negative for abdominal pain, nausea and vomiting.  Neurological: Negative for dizziness and headaches.   Physical Exam:  Vitals:   11/03/19 1425  BP: (!) 187/113  Pulse: 97  Temp: 98.1 F (36.7 C)  TempSrc: Oral  SpO2: 98%  Weight: 185 lb 4.8 oz (84.1 kg)  Height: 5\' 3"  (1.6 m)   Physical Exam Vitals reviewed.  Constitutional:      General: He is not in acute distress. Cardiovascular:     Rate and Rhythm: Normal rate and regular rhythm.     Pulses: Normal pulses.     Heart sounds: Normal heart sounds. No murmur heard.   Pulmonary:     Effort: Pulmonary effort is normal.     Breath sounds: Normal breath sounds. No wheezing or rales.  Abdominal:     General: There is no distension.     Palpations: Abdomen is soft.     Tenderness: There is no abdominal tenderness.  Neurological:     Mental Status: He is alert.      Assessment & Plan:   See Encounters Tab for problem based charting.  Patient discussed with Dr. Rebeca Alert

## 2019-11-07 ENCOUNTER — Encounter: Payer: Self-pay | Admitting: Internal Medicine

## 2019-11-07 NOTE — Assessment & Plan Note (Addendum)
This is the first visit in our clinic. BP today elevated at 187/113. He denies headache, dizziness, chest pain. He mentions that he was in prison for a while and received some care there. But mentions that he has not take Metoprolol 100 mg BID, Lisinopril 40 mg QD for couple of months, mainly because he thinks these medicines did not work as he expected.  He uses some home remedies for blood pressure. We talked about importance of controlling his blood pressure. He is willing to try medications again. -Sent refill for Mtoprolol 100 mg BID and Lisinopril 40 mg QD. -F/u in 2 weeks for BP check and BMP. (Pt has established care with [facility] on February but no more visit there. We will be happy to provide care here if he is interested. He mentions that he will think and decide.

## 2019-11-19 NOTE — Progress Notes (Signed)
Internal Medicine Clinic Attending  Case discussed with Dr. Masoudi at the time of the visit.  We reviewed the resident's history and exam and pertinent patient test results.  I agree with the assessment, diagnosis, and plan of care documented in the resident's note.  Lottie Sigman, M.D., Ph.D.  

## 2019-11-29 ENCOUNTER — Ambulatory Visit: Payer: Self-pay

## 2020-10-02 ENCOUNTER — Encounter: Payer: Self-pay | Admitting: *Deleted

## 2021-06-01 ENCOUNTER — Other Ambulatory Visit: Payer: Self-pay

## 2021-06-01 ENCOUNTER — Inpatient Hospital Stay (HOSPITAL_COMMUNITY)
Admission: EM | Admit: 2021-06-01 | Discharge: 2021-06-05 | DRG: 291 | Disposition: A | Discharge status: Home / Self Care | Payer: Medicaid Other | Attending: Internal Medicine | Admitting: Internal Medicine

## 2021-06-01 DIAGNOSIS — I5043 Acute on chronic combined systolic (congestive) and diastolic (congestive) heart failure: Secondary | ICD-10-CM | POA: Diagnosis present

## 2021-06-01 DIAGNOSIS — M1990 Unspecified osteoarthritis, unspecified site: Secondary | ICD-10-CM | POA: Diagnosis present

## 2021-06-01 DIAGNOSIS — Z823 Family history of stroke: Secondary | ICD-10-CM

## 2021-06-01 DIAGNOSIS — F209 Schizophrenia, unspecified: Secondary | ICD-10-CM | POA: Diagnosis present

## 2021-06-01 DIAGNOSIS — I5042 Chronic combined systolic (congestive) and diastolic (congestive) heart failure: Secondary | ICD-10-CM | POA: Diagnosis present

## 2021-06-01 DIAGNOSIS — Z5986 Financial insecurity: Secondary | ICD-10-CM

## 2021-06-01 DIAGNOSIS — Z8249 Family history of ischemic heart disease and other diseases of the circulatory system: Secondary | ICD-10-CM

## 2021-06-01 DIAGNOSIS — Z87891 Personal history of nicotine dependence: Secondary | ICD-10-CM

## 2021-06-01 DIAGNOSIS — Z880 Allergy status to penicillin: Secondary | ICD-10-CM

## 2021-06-01 DIAGNOSIS — R778 Other specified abnormalities of plasma proteins: Secondary | ICD-10-CM | POA: Diagnosis present

## 2021-06-01 DIAGNOSIS — N184 Chronic kidney disease, stage 4 (severe): Secondary | ICD-10-CM | POA: Diagnosis present

## 2021-06-01 DIAGNOSIS — I509 Heart failure, unspecified: Secondary | ICD-10-CM

## 2021-06-01 DIAGNOSIS — N179 Acute kidney failure, unspecified: Secondary | ICD-10-CM | POA: Diagnosis present

## 2021-06-01 DIAGNOSIS — Z96643 Presence of artificial hip joint, bilateral: Secondary | ICD-10-CM | POA: Diagnosis present

## 2021-06-01 DIAGNOSIS — I169 Hypertensive crisis, unspecified: Secondary | ICD-10-CM | POA: Diagnosis present

## 2021-06-01 DIAGNOSIS — I130 Hypertensive heart and chronic kidney disease with heart failure and stage 1 through stage 4 chronic kidney disease, or unspecified chronic kidney disease: Principal | ICD-10-CM | POA: Diagnosis present

## 2021-06-01 DIAGNOSIS — J9601 Acute respiratory failure with hypoxia: Secondary | ICD-10-CM | POA: Diagnosis present

## 2021-06-01 DIAGNOSIS — I428 Other cardiomyopathies: Secondary | ICD-10-CM | POA: Diagnosis present

## 2021-06-01 DIAGNOSIS — I441 Atrioventricular block, second degree: Secondary | ICD-10-CM | POA: Diagnosis not present

## 2021-06-01 DIAGNOSIS — Z20822 Contact with and (suspected) exposure to covid-19: Secondary | ICD-10-CM | POA: Diagnosis present

## 2021-06-01 DIAGNOSIS — I10 Essential (primary) hypertension: Secondary | ICD-10-CM | POA: Diagnosis present

## 2021-06-01 DIAGNOSIS — R0602 Shortness of breath: Secondary | ICD-10-CM | POA: Diagnosis present

## 2021-06-01 DIAGNOSIS — N1831 Chronic kidney disease, stage 3a: Secondary | ICD-10-CM | POA: Diagnosis present

## 2021-06-01 DIAGNOSIS — D631 Anemia in chronic kidney disease: Secondary | ICD-10-CM | POA: Diagnosis present

## 2021-06-01 DIAGNOSIS — I248 Other forms of acute ischemic heart disease: Secondary | ICD-10-CM | POA: Diagnosis present

## 2021-06-01 DIAGNOSIS — R0600 Dyspnea, unspecified: Secondary | ICD-10-CM

## 2021-06-01 DIAGNOSIS — F22 Delusional disorders: Secondary | ICD-10-CM | POA: Diagnosis present

## 2021-06-01 DIAGNOSIS — I4589 Other specified conduction disorders: Secondary | ICD-10-CM | POA: Diagnosis present

## 2021-06-01 DIAGNOSIS — Z91199 Patient's noncompliance with other medical treatment and regimen due to unspecified reason: Secondary | ICD-10-CM

## 2021-06-01 NOTE — ED Triage Notes (Signed)
Pt arrived via [facility] from home. Per EMS pt called for SOB "for a few days" with hx of CHF and HTN however does not take medication. EMS further reports initial lung sounds diminished in uppers and wheezing with rales in lower. 5mg  albuterol admin by EMS which relieved the wheezing throughout transport but EMS states it has started to increase since arrival at ED. Hypertensive at 204/130, ST at 106. 90% SpO2 on RA, increased to 100% on neb, and 94% RA after neb.

## 2021-06-02 ENCOUNTER — Encounter (HOSPITAL_COMMUNITY): Payer: Self-pay | Admitting: Internal Medicine

## 2021-06-02 ENCOUNTER — Emergency Department (HOSPITAL_COMMUNITY): Payer: Medicaid Other

## 2021-06-02 ENCOUNTER — Other Ambulatory Visit (HOSPITAL_COMMUNITY): Payer: Medicaid Other

## 2021-06-02 ENCOUNTER — Inpatient Hospital Stay (HOSPITAL_COMMUNITY): Payer: Medicaid Other

## 2021-06-02 DIAGNOSIS — I441 Atrioventricular block, second degree: Secondary | ICD-10-CM | POA: Diagnosis not present

## 2021-06-02 DIAGNOSIS — J9601 Acute respiratory failure with hypoxia: Secondary | ICD-10-CM | POA: Diagnosis present

## 2021-06-02 DIAGNOSIS — R778 Other specified abnormalities of plasma proteins: Secondary | ICD-10-CM | POA: Diagnosis not present

## 2021-06-02 DIAGNOSIS — N1831 Chronic kidney disease, stage 3a: Secondary | ICD-10-CM | POA: Diagnosis present

## 2021-06-02 DIAGNOSIS — I5021 Acute systolic (congestive) heart failure: Secondary | ICD-10-CM

## 2021-06-02 DIAGNOSIS — I428 Other cardiomyopathies: Secondary | ICD-10-CM | POA: Diagnosis present

## 2021-06-02 DIAGNOSIS — I5033 Acute on chronic diastolic (congestive) heart failure: Secondary | ICD-10-CM | POA: Insufficient documentation

## 2021-06-02 DIAGNOSIS — I5043 Acute on chronic combined systolic (congestive) and diastolic (congestive) heart failure: Secondary | ICD-10-CM | POA: Diagnosis present

## 2021-06-02 DIAGNOSIS — I10 Essential (primary) hypertension: Secondary | ICD-10-CM

## 2021-06-02 DIAGNOSIS — Z8249 Family history of ischemic heart disease and other diseases of the circulatory system: Secondary | ICD-10-CM | POA: Diagnosis not present

## 2021-06-02 DIAGNOSIS — I248 Other forms of acute ischemic heart disease: Secondary | ICD-10-CM | POA: Diagnosis present

## 2021-06-02 DIAGNOSIS — I5042 Chronic combined systolic (congestive) and diastolic (congestive) heart failure: Secondary | ICD-10-CM | POA: Diagnosis present

## 2021-06-02 DIAGNOSIS — Z5986 Financial insecurity: Secondary | ICD-10-CM | POA: Diagnosis not present

## 2021-06-02 DIAGNOSIS — F22 Delusional disorders: Secondary | ICD-10-CM | POA: Diagnosis present

## 2021-06-02 DIAGNOSIS — Z87891 Personal history of nicotine dependence: Secondary | ICD-10-CM | POA: Diagnosis not present

## 2021-06-02 DIAGNOSIS — R7989 Other specified abnormal findings of blood chemistry: Secondary | ICD-10-CM | POA: Diagnosis present

## 2021-06-02 DIAGNOSIS — Z91199 Patient's noncompliance with other medical treatment and regimen due to unspecified reason: Secondary | ICD-10-CM | POA: Diagnosis not present

## 2021-06-02 DIAGNOSIS — F209 Schizophrenia, unspecified: Secondary | ICD-10-CM | POA: Diagnosis present

## 2021-06-02 DIAGNOSIS — N179 Acute kidney failure, unspecified: Secondary | ICD-10-CM | POA: Diagnosis present

## 2021-06-02 DIAGNOSIS — Z880 Allergy status to penicillin: Secondary | ICD-10-CM | POA: Diagnosis not present

## 2021-06-02 DIAGNOSIS — I4589 Other specified conduction disorders: Secondary | ICD-10-CM | POA: Diagnosis present

## 2021-06-02 DIAGNOSIS — R0602 Shortness of breath: Secondary | ICD-10-CM

## 2021-06-02 DIAGNOSIS — M1990 Unspecified osteoarthritis, unspecified site: Secondary | ICD-10-CM | POA: Diagnosis present

## 2021-06-02 DIAGNOSIS — N189 Chronic kidney disease, unspecified: Secondary | ICD-10-CM | POA: Diagnosis present

## 2021-06-02 DIAGNOSIS — Z20822 Contact with and (suspected) exposure to covid-19: Secondary | ICD-10-CM | POA: Diagnosis present

## 2021-06-02 DIAGNOSIS — N170 Acute kidney failure with tubular necrosis: Secondary | ICD-10-CM | POA: Diagnosis not present

## 2021-06-02 DIAGNOSIS — Z96643 Presence of artificial hip joint, bilateral: Secondary | ICD-10-CM | POA: Diagnosis present

## 2021-06-02 DIAGNOSIS — N184 Chronic kidney disease, stage 4 (severe): Secondary | ICD-10-CM | POA: Diagnosis present

## 2021-06-02 DIAGNOSIS — Z823 Family history of stroke: Secondary | ICD-10-CM | POA: Diagnosis not present

## 2021-06-02 DIAGNOSIS — D631 Anemia in chronic kidney disease: Secondary | ICD-10-CM | POA: Diagnosis present

## 2021-06-02 DIAGNOSIS — I169 Hypertensive crisis, unspecified: Secondary | ICD-10-CM | POA: Diagnosis present

## 2021-06-02 DIAGNOSIS — I130 Hypertensive heart and chronic kidney disease with heart failure and stage 1 through stage 4 chronic kidney disease, or unspecified chronic kidney disease: Secondary | ICD-10-CM | POA: Diagnosis present

## 2021-06-02 LAB — COMPREHENSIVE METABOLIC PANEL
ALT: 108 U/L — ABNORMAL HIGH (ref 0–44)
ALT: 110 U/L — ABNORMAL HIGH (ref 0–44)
AST: 107 U/L — ABNORMAL HIGH (ref 15–41)
AST: 84 U/L — ABNORMAL HIGH (ref 15–41)
Albumin: 3.5 g/dL (ref 3.5–5.0)
Albumin: 3.5 g/dL (ref 3.5–5.0)
Alkaline Phosphatase: 85 U/L (ref 38–126)
Alkaline Phosphatase: 93 U/L (ref 38–126)
Anion gap: 10 (ref 5–15)
Anion gap: 12 (ref 5–15)
BUN: 49 mg/dL — ABNORMAL HIGH (ref 6–20)
BUN: 49 mg/dL — ABNORMAL HIGH (ref 6–20)
CO2: 17 mmol/L — ABNORMAL LOW (ref 22–32)
CO2: 17 mmol/L — ABNORMAL LOW (ref 22–32)
Calcium: 7.8 mg/dL — ABNORMAL LOW (ref 8.9–10.3)
Calcium: 8.1 mg/dL — ABNORMAL LOW (ref 8.9–10.3)
Chloride: 110 mmol/L (ref 98–111)
Chloride: 112 mmol/L — ABNORMAL HIGH (ref 98–111)
Creatinine, Ser: 4.07 mg/dL — ABNORMAL HIGH (ref 0.61–1.24)
Creatinine, Ser: 4.08 mg/dL — ABNORMAL HIGH (ref 0.61–1.24)
GFR, Estimated: 16 mL/min — ABNORMAL LOW (ref >60)
GFR, Estimated: 16 mL/min — ABNORMAL LOW (ref >60)
Glucose, Bld: 117 mg/dL — ABNORMAL HIGH (ref 70–99)
Glucose, Bld: 140 mg/dL — ABNORMAL HIGH (ref 70–99)
Potassium: 4.6 mmol/L (ref 3.5–5.1)
Potassium: 4.7 mmol/L (ref 3.5–5.1)
Sodium: 139 mmol/L (ref 135–145)
Sodium: 139 mmol/L (ref 135–145)
Total Bilirubin: 1 mg/dL (ref 0.3–1.2)
Total Bilirubin: 1.1 mg/dL (ref 0.3–1.2)
Total Protein: 7 g/dL (ref 6.5–8.1)
Total Protein: 7.1 g/dL (ref 6.5–8.1)

## 2021-06-02 LAB — CBC WITH DIFFERENTIAL/PLATELET
Abs Immature Granulocytes: 0.03 10*3/uL (ref 0.00–0.07)
Abs Immature Granulocytes: 0.04 10*3/uL (ref 0.00–0.07)
Basophils Absolute: 0 10*3/uL (ref 0.0–0.1)
Basophils Absolute: 0.1 10*3/uL (ref 0.0–0.1)
Basophils Relative: 0 %
Basophils Relative: 1 %
Eosinophils Absolute: 0 10*3/uL (ref 0.0–0.5)
Eosinophils Absolute: 0.1 10*3/uL (ref 0.0–0.5)
Eosinophils Relative: 0 %
Eosinophils Relative: 1 %
HCT: 29.8 % — ABNORMAL LOW (ref 39.0–52.0)
HCT: 30.3 % — ABNORMAL LOW (ref 39.0–52.0)
Hemoglobin: 10.1 g/dL — ABNORMAL LOW (ref 13.0–17.0)
Hemoglobin: 10.1 g/dL — ABNORMAL LOW (ref 13.0–17.0)
Immature Granulocytes: 0 %
Immature Granulocytes: 1 %
Lymphocytes Relative: 4 %
Lymphocytes Relative: 7 %
Lymphs Abs: 0.3 10*3/uL — ABNORMAL LOW (ref 0.7–4.0)
Lymphs Abs: 0.6 10*3/uL — ABNORMAL LOW (ref 0.7–4.0)
MCH: 29.2 pg (ref 26.0–34.0)
MCH: 29.7 pg (ref 26.0–34.0)
MCHC: 33.3 g/dL (ref 30.0–36.0)
MCHC: 33.9 g/dL (ref 30.0–36.0)
MCV: 87.6 fL (ref 80.0–100.0)
MCV: 87.6 fL (ref 80.0–100.0)
Monocytes Absolute: 0.1 10*3/uL (ref 0.1–1.0)
Monocytes Absolute: 0.2 10*3/uL (ref 0.1–1.0)
Monocytes Relative: 1 %
Monocytes Relative: 3 %
Neutro Abs: 7.5 10*3/uL (ref 1.7–7.7)
Neutro Abs: 7.9 10*3/uL — ABNORMAL HIGH (ref 1.7–7.7)
Neutrophils Relative %: 87 %
Neutrophils Relative %: 95 %
Platelets: 284 10*3/uL (ref 150–400)
Platelets: 291 10*3/uL (ref 150–400)
RBC: 3.4 MIL/uL — ABNORMAL LOW (ref 4.22–5.81)
RBC: 3.46 MIL/uL — ABNORMAL LOW (ref 4.22–5.81)
RDW: 14.2 % (ref 11.5–15.5)
RDW: 14.2 % (ref 11.5–15.5)
WBC: 8.3 10*3/uL (ref 4.0–10.5)
WBC: 8.5 10*3/uL (ref 4.0–10.5)
nRBC: 0 % (ref 0.0–0.2)
nRBC: 0 % (ref 0.0–0.2)

## 2021-06-02 LAB — URINALYSIS, COMPLETE (UACMP) WITH MICROSCOPIC
Bacteria, UA: NONE SEEN
Bilirubin Urine: NEGATIVE
Glucose, UA: NEGATIVE mg/dL
Ketones, ur: NEGATIVE mg/dL
Leukocytes,Ua: NEGATIVE
Nitrite: NEGATIVE
Protein, ur: 100 mg/dL — AB
Specific Gravity, Urine: 1.006 (ref 1.005–1.030)
pH: 5 (ref 5.0–8.0)

## 2021-06-02 LAB — ETHANOL: Alcohol, Ethyl (B): <10 mg/dL (ref <10)

## 2021-06-02 LAB — RAPID URINE DRUG SCREEN, HOSP PERFORMED
Amphetamines: NOT DETECTED
Barbiturates: NOT DETECTED
Benzodiazepines: NOT DETECTED
Cocaine: NOT DETECTED
Opiates: NOT DETECTED
Tetrahydrocannabinol: NOT DETECTED

## 2021-06-02 LAB — MAGNESIUM: Magnesium: 2 mg/dL (ref 1.7–2.4)

## 2021-06-02 LAB — ECHOCARDIOGRAM COMPLETE
Area-P 1/2: 5.84 cm2
Height: 63 in
MV M vel: 5.65 m/s
MV Peak grad: 127.7 mmHg
Radius: 0.4 cm
S' Lateral: 4.9 cm
Single Plane A4C EF: 25 %
Weight: 2960 oz

## 2021-06-02 LAB — PROCALCITONIN: Procalcitonin: <0.1 ng/mL

## 2021-06-02 LAB — HIV ANTIBODY (ROUTINE TESTING W REFLEX): HIV Screen 4th Generation wRfx: NONREACTIVE

## 2021-06-02 LAB — RESP PANEL BY RT-PCR (FLU A&B, COVID) ARPGX2
Influenza A by PCR: NEGATIVE
Influenza B by PCR: NEGATIVE
SARS Coronavirus 2 by RT PCR: NEGATIVE

## 2021-06-02 LAB — MRSA NEXT GEN BY PCR, NASAL: MRSA by PCR Next Gen: NOT DETECTED

## 2021-06-02 LAB — CREATININE, URINE, RANDOM: Creatinine, Urine: 23.06 mg/dL

## 2021-06-02 LAB — TROPONIN I (HIGH SENSITIVITY)
Troponin I (High Sensitivity): 106 ng/L (ref <18)
Troponin I (High Sensitivity): 128 ng/L (ref <18)

## 2021-06-02 LAB — PROTEIN / CREATININE RATIO, URINE
Creatinine, Urine: 40.18 mg/dL
Protein Creatinine Ratio: 0.87 mg/mg{Cre} — ABNORMAL HIGH (ref 0.00–0.15)
Total Protein, Urine: 35 mg/dL

## 2021-06-02 LAB — PHOSPHORUS: Phosphorus: 4.5 mg/dL (ref 2.5–4.6)

## 2021-06-02 LAB — SODIUM, URINE, RANDOM: Sodium, Ur: 103 mmol/L

## 2021-06-02 LAB — BRAIN NATRIURETIC PEPTIDE: B Natriuretic Peptide: 1118.3 pg/mL — ABNORMAL HIGH (ref 0.0–100.0)

## 2021-06-02 MED ORDER — FUROSEMIDE 10 MG/ML IJ SOLN
40.0000 mg | Freq: Two times a day (BID) | INTRAMUSCULAR | Status: DC
  Administered 2021-06-02 – 2021-06-03 (×3): 40 mg via INTRAVENOUS
  Filled 2021-06-02 (×3): qty 4

## 2021-06-02 MED ORDER — HEPARIN SODIUM (PORCINE) 5000 UNIT/ML IJ SOLN
5000.0000 [IU] | Freq: Three times a day (TID) | INTRAMUSCULAR | Status: DC
  Administered 2021-06-02 – 2021-06-05 (×10): 5000 [IU] via SUBCUTANEOUS
  Filled 2021-06-02 (×10): qty 1

## 2021-06-02 MED ORDER — ACETAMINOPHEN 650 MG RE SUPP: 650.0000 mg | Freq: Four times a day (QID) | RECTAL | Status: DC | PRN

## 2021-06-02 MED ORDER — HYDRALAZINE HCL 20 MG/ML IJ SOLN: 10.0000 mg | INTRAMUSCULAR | Status: DC | PRN

## 2021-06-02 MED ORDER — FUROSEMIDE 10 MG/ML IJ SOLN
40.0000 mg | Freq: Two times a day (BID) | INTRAMUSCULAR | Status: DC
  Administered 2021-06-02: 40 mg via INTRAVENOUS
  Filled 2021-06-02: qty 4

## 2021-06-02 MED ORDER — ACETAMINOPHEN 325 MG PO TABS: 650.0000 mg | ORAL_TABLET | Freq: Four times a day (QID) | ORAL | Status: DC | PRN

## 2021-06-02 MED ORDER — ISOSORBIDE MONONITRATE ER 60 MG PO TB24
60.0000 mg | ORAL_TABLET | Freq: Every day | ORAL | Status: DC
  Administered 2021-06-02 – 2021-06-05 (×4): 60 mg via ORAL
  Filled 2021-06-02 (×2): qty 1
  Filled 2021-06-02: qty 2
  Filled 2021-06-02: qty 1

## 2021-06-02 MED ORDER — FUROSEMIDE 10 MG/ML IJ SOLN
40.0000 mg | Freq: Once | INTRAMUSCULAR | Status: AC
  Administered 2021-06-02: 40 mg via INTRAVENOUS
  Filled 2021-06-02: qty 4

## 2021-06-02 MED ORDER — ALBUTEROL SULFATE (2.5 MG/3ML) 0.083% IN NEBU: 2.5000 mg | INHALATION_SOLUTION | RESPIRATORY_TRACT | Status: DC | PRN

## 2021-06-02 MED ORDER — CARVEDILOL 6.25 MG PO TABS
6.2500 mg | ORAL_TABLET | Freq: Two times a day (BID) | ORAL | Status: DC
  Administered 2021-06-02 – 2021-06-05 (×7): 6.25 mg via ORAL
  Filled 2021-06-02 (×6): qty 1
  Filled 2021-06-02: qty 2

## 2021-06-02 MED ORDER — LABETALOL HCL 5 MG/ML IV SOLN
5.0000 mg | Freq: Once | INTRAVENOUS | Status: AC
  Administered 2021-06-02: 5 mg via INTRAVENOUS
  Filled 2021-06-02: qty 4

## 2021-06-02 NOTE — Consult Note (Signed)
Renal Service Consult Note [facility]  Paul [name] 06/02/2021 Sol Blazing, MD Requesting Physician: Dr. Candiss Norse, Mamie Nick.   Reason for Consult: Renal failure HPI: The patient is a 56 y.o. year-old w/ hx of bipolar d/o, mixed CHF, OA, CKD, schizophrenia, severe HTN who presented to ED on 1/27 from home per EMS c/o SOB w/ hx CHF and HTN not taking his medications. BP 240/130, HR 106, SpO2 90% on RA. In ED pt rec'd IV lasix and labetalol, supp O2 by 3L [place]. Creat was 4.0 (last creat was 1.5 in 2014). WBC 8K, COVID neg. CXR showed L base opacity atx vs infiltrate. Pt was admitted. Asked to see for renal failure.   Pt seen in ED. States he had CHF prior to going to prison from 2015- 2020 for assaulting his wife. Now divorced, does not have access to his children. In prison they told him his kidneys "were failing".  Never mentioned dialysis. He took HTN meds for a while but they "didn't seem to doing anything" so he stopped them.  Main issue that brought him in were DOE symptoms, got to where he couldn't walk the parking lot at the grocery store w/o stopping.   Denies any difficulty voiding, burning, hesitancy, frequency or other issues. No hematuria.   ROS - denies CP, no joint pain, no HA, no blurry vision, no rash, no diarrhea, no nausea/ vomiting, no dysuria, no difficulty voiding   Past Medical History  Past Medical History:  Diagnosis Date   Bipolar disorder ([place])    Central retinal artery occlusion    with macular infarction of the right eye. status post posterior vitrectomy and photocoagulation with insertion of a shunt in 2006.   CHF (congestive heart failure) (HCC)    chronic mixed syst/diast. 45/8099 echo: Systolic function was mildly reduced. The estimated ejection fraction was in 45%, global HK, Grade I Diast dysfxn.   Heart failure    Obesity    Osteoarthritis    s/p right hip replacement in 2001   Schizophrenia ([place])    Severe uncontrolled hypertension     Past Surgical History  Past Surgical History:  Procedure Laterality Date   JOINT REPLACEMENT     right hip replacement   right eye surgery     for glaucoma   TOTAL HIP ARTHROPLASTY Bilateral 1997, 2001   Family History  Family History  Problem Relation Age of Onset   Hypertension Father    Stroke Father    Social History  reports that he has quit smoking. He has never used smokeless tobacco. He reports that he does not drink alcohol and does not use drugs. Allergies  Allergies  Allergen Reactions   Penicillins Other (See Comments)    childhood   Home medications Prior to Admission medications   Medication Sig Start Date End Date Taking? Authorizing Provider  fenofibrate 54 MG tablet Take 54 mg by mouth daily. Patient not taking: Reported on 06/02/2021    [provider]  lisinopril (ZESTRIL) 40 MG tablet Take 1 tablet (40 mg total) by mouth daily. Patient not taking: Reported on 06/02/2021 11/03/19   Dewayne Hatch, MD  metoprolol tartrate (LOPRESSOR) 100 MG tablet Take 1 tablet (100 mg total) by mouth 2 (two) times daily. Patient not taking: Reported on 06/02/2021 11/03/19   Dewayne Hatch, MD     Vitals:   06/02/21 1151 06/02/21 1249 06/02/21 1300 06/02/21 1531  BP: 116/82 (!) 127/92 127/76 132/90  Pulse: 76 85 83 82  Resp: 20 19 17 18   Temp:  98.3 F (36.8 C) 97.7 F (36.5 C) 97.9 F (36.6 C)  TempSrc:   Oral Oral  SpO2: 100% 99% 97% 98%  Weight:   82.1 kg   Height:       Exam Gen alert, no distress No rash, cyanosis or gangrene Sclera anicteric, throat clear  +JVD to angle of the jaw Chest clear bilat to bases, no rales/ wheezing RRR no RG Abd soft ntnd no mass or ascites +bs GU normal male MS no joint effusions or deformity Ext trace pretib edema, no other edema Neuro is alert, Ox 3 , nf, no asterixis         Date   Creat  eGFR   2009   1.4   2010   1.4- 1.92 42- 58 ml/min, IIIa   2013   1.50- 1.61 53- 57 ml/min, IIIa    2014   1.45  59 ml/min   06/01/21  4.07  16 ml/min     06/02/21  4.08   Home meds include - fenofibrate, lisinopril, metoprolol 100 bid     UA prot 100, 0-5 rbc/ wbc   UNa 103,  UCr 23   Renal US - 8.6/ 9.2 cm kidneys w/o hydro, +bilat ureteral jets, ^'d echo bilat    CXR 1/28 - IMPRESSION: Mild left basilar opacity, favoring atelectasis, less likely pneumonia.   ECHO - LVEF 30-35%, G2DD. Focal wma's of inf / inferoseptal and inferolateral wall. No LVH. RV appears wnl.    Assessment/ Plan: AKI on CKD 3a - CKD was in 2014.  Pt was in prison 2015- 2020 where he was told his kidneys "were failing". Suspect this is all chronic damage primarily related to damage from uncontrolled HTN and he has likely CKD 4 at this point. UA negative, renal US w/ chronic changes. No indication for RRT at this time.  Would get cardiology involved given dropping EF 30-35% and DOE symptoms.  He is not grossly overloaded on exam but +jvd and CXR showing vasc congestion warrants continue diuresis. Will continue IV lasix at 40 bid which he appears to be responding to. Check UP/C ratio. Will follow.    HTN - BP's much better on coreg 6.25 bid and IV lasix only HFrEF - as above    Kelly Splinter  MD 06/02/2021, 5:43 PM  Recent Labs  Lab 06/01/21 2340 06/02/21 0305  WBC 8.5 8.3  HGB 10.1* 10.1*   Recent Labs  Lab 06/01/21 2340 06/02/21 0305  K 4.6 4.7  BUN 49* 49*  CREATININE 4.07* 4.08*  ALBUMIN 3.5 3.5  CALCIUM 7.8* 8.1*  PHOS  --  4.5

## 2021-06-02 NOTE — Evaluation (Signed)
Physical Therapy Evaluation Patient Details Name: Paul [name] MRN: [id number] DOB: 10/19/1965 Today's Date: 06/02/2021  History of Present Illness  56 y.o. male presents to [facility] hospital on 06/01/2021 with SOB. Concern for CHF exacerbation. PMH includes chronic systolic/diastolic heart failure, hypertension, stage IIIa chronic kidney disease.  Clinical Impression  Pt presents to PT with deficits in cardiopulmonary function and endurance. Pt with SOB when ambulating and increased work of breathing although SpO2 remains 90% or above. Pt will benefit from continued acute PT services to aide in improving activity tolerance and restoring the pt's prior level of function.       Recommendations for follow up therapy are one component of a multi-disciplinary discharge planning process, led by the attending physician.  Recommendations may be updated based on patient status, additional functional criteria and insurance authorization.  Follow Up Recommendations No PT follow up    Assistance Recommended at Discharge PRN  Patient can return home with the following  Help with stairs or ramp for entrance    Equipment Recommendations None recommended by PT  Recommendations for Other Services       Functional Status Assessment Patient has had a recent decline in their functional status and demonstrates the ability to make significant improvements in function in a reasonable and predictable amount of time.     Precautions / Restrictions Precautions Precautions: Fall;Other (comment) Precaution Comments: monitor SpO2 Restrictions Weight Bearing Restrictions: No      Mobility  Bed Mobility                    Transfers Overall transfer level: Independent Equipment used: None                    Ambulation/Gait Ambulation/Gait assistance: Supervision Gait Distance (Feet): 150 Feet Assistive device: None Gait Pattern/deviations: Step-through pattern Gait velocity:  functional Gait velocity interpretation: >2.62 ft/sec, indicative of community ambulatory   General Gait Details: reduced stride length, widened BOS, increased trunk sway laterally. 2 standing rest breaks due to SOB  Stairs            Wheelchair Mobility    Modified Rankin (Stroke Patients Only)       Balance Overall balance assessment: Needs assistance Sitting-balance support: No upper extremity supported, Feet supported Sitting balance-Leahy Scale: Good     Standing balance support: No upper extremity supported, During functional activity Standing balance-Leahy Scale: Good                               Pertinent Vitals/Pain Pain Assessment Pain Assessment: No/denies pain    Home Living Family/patient expects to be discharged to:: Other (Comment) ("sober house" per pt) Living Arrangements: Non-relatives/Friends Available Help at Discharge: Family;Friend(s);Available PRN/intermittently Type of Home: House Home Access: Level entry     Alternate Level Stairs-Number of Steps: flight Home Layout: Two level Home Equipment: None      Prior Function Prior Level of Function : Independent/Modified Independent;Working/employed;Driving             Mobility Comments: pt works for [employer], trade [employer]        Extremity/Trunk Assessment   Upper Extremity Assessment Upper Extremity Assessment: Overall WFL for tasks assessed    Lower Extremity Assessment Lower Extremity Assessment: Overall WFL for tasks assessed    Cervical / Trunk Assessment Cervical / Trunk Assessment: Normal  Communication   Communication: No  difficulties  Cognition Arousal/Alertness: Awake/alert Behavior During Therapy: WFL for tasks assessed/performed Overall Cognitive Status: Within Functional Limits for tasks assessed                                          General Comments General comments (skin integrity, edema, etc.): pt on  RA, desats to 90% with ambulation with PT noting increased work of breathing. Pt reports SOB and takes 2 standing rest breaks with mobility    Exercises     Assessment/Plan    PT Assessment Patient needs continued PT services  PT Problem List Decreased activity tolerance;Cardiopulmonary status limiting activity       PT Treatment Interventions Gait training;Stair training;Therapeutic activities;Therapeutic exercise;Patient/family education    PT Goals (Current goals can be found in the Care Plan section)  Acute Rehab PT Goals Patient Stated Goal: to improve activity tolerance PT Goal Formulation: With patient Time For Goal Achievement: 06/16/21 Potential to Achieve Goals: Good Additional Goals Additional Goal #1: Pt will report DOE of 3/10 or less when ambulating for >500'    Frequency Min 3X/week     Co-evaluation               AM-PAC PT "6 Clicks" Mobility  Outcome Measure Help needed turning from your back to your side while in a flat bed without using bedrails?: None Help needed moving from lying on your back to sitting on the side of a flat bed without using bedrails?: None Help needed moving to and from a bed to a chair (including a wheelchair)?: None Help needed standing up from a chair using your arms (e.g., wheelchair or bedside chair)?: None Help needed to walk in hospital room?: A Little Help needed climbing 3-5 steps with a railing? : A Little 6 Click Score: 22    End of Session   Activity Tolerance: Patient limited by fatigue Patient left: in bed;with call bell/phone within reach Nurse Communication: Mobility status PT Visit Diagnosis: Other abnormalities of gait and mobility (R26.89)    Time: 8786-7672 PT Time Calculation (min) (ACUTE ONLY): 9 min   Charges:   PT Evaluation $PT Eval Low Complexity: 1 Low          Zenaida Niece, PT, DPT Acute Rehabilitation Pager: [phone] Office [phone]   Zenaida Niece 06/02/2021, 1:10 PM

## 2021-06-02 NOTE — Progress Notes (Signed)
PROGRESS NOTE                                                                                                                                                                                                             Patient Demographics:    Paul [name], is a 56 y.o. male, DOB - [birth year], UJW:[id number]  Outpatient Primary MD for the patient is No primary care provider on file.    LOS - 0  Admit date - 06/01/2021    Chief Complaint  Patient presents with   Shortness of Breath       Brief Narrative (HPI from H&P)   56 y.o. male with medical history significant for chronic systolic/diastolic heart failure, hypertension, stage IIIa chronic kidney disease, who is admitted to [facility] on 06/01/2021 with SOB due CHF + AKI.   Subjective:    [name] today has, No headache, No chest pain, No abdominal pain - No Nausea, No new weakness tingling or numbness, improved SOB.   Assessment  & Plan :    No notes have been filed under this hospital service. Service: Hospitalist    Acute Hypoxic Resp. Failure due to Acute on chronic combined systolic and diastolic heart failure.  EF last known in 2013 was 45%. - he has been given IV Lasix with good effect, he has diuresed well and shortness of breath is improved, continue cautious diuresis, echocardiogram pending, troponin level mildly elevated due to CHF causing demand mismatch, troponin trend is flat and in non-ACS pattern.  Have added Coreg and Imdur to his regimen.  Monitor closely  2.  Renal failure.  Last creatinine over 5 years ago is 1.5, his renal failure could have progressed to CKD stage V however AKI superimposed on CKD 3B cannot be ruled out.  Will obtain UA along with renal ultrasound, will involve nephrology.  3.  Hypertension.  Placed on Coreg and Imdur.  Monitor and adjust.        Condition - Extremely Guarded  Family Communication  :  none  present  Code Status :  Full  Consults  :  None  PUD Prophylaxis :    Procedures  :     Renal US  TTE      Disposition Plan  :    Status is: Inpatient  Remains inpatient appropriate  because: CHF  DVT Prophylaxis  :  Heparin added  SCDs Start: 06/02/21 0225   Lab Results  Component Value Date   PLT 284 06/02/2021    Diet :  Diet Order             Diet Heart Room service appropriate? Yes; Fluid consistency: Thin  Diet effective now                    Inpatient Medications  Scheduled Meds:  carvedilol  6.25 mg Oral BID WC   isosorbide mononitrate  60 mg Oral Daily   Continuous Infusions: PRN Meds:.acetaminophen **OR** acetaminophen, hydrALAZINE  Antibiotics  :    Anti-infectives (From admission, onward)    None        Time Spent in minutes  30   Lala Lund M.D on 06/02/2021 at 11:40 AM  To page go to [email or web address]   [facility] -  Office  [phone]  See all Orders from today for further details    Objective:   Vitals:   06/02/21 0700 06/02/21 0715 06/02/21 0736 06/02/21 1106  BP: 138/75 140/82 131/69 127/90  Pulse: 75 75 71 79  Resp: 15 14 16 15   Temp:      TempSrc:      SpO2: 94% 95% 96% 99%  Weight:      Height:        Wt Readings from Last 3 Encounters:  06/01/21 83.9 kg  11/03/19 84.1 kg  09/12/11 90.9 kg     Intake/Output Summary (Last 24 hours) at 06/02/2021 1140 Last data filed at 06/02/2021 0908 Gross per 24 hour  Intake --  Output 1100 ml  Net -1100 ml     Physical Exam  Awake Alert, No new F.N deficits, Normal affect [place].AT,PERRAL Supple Neck, No JVD,   Symmetrical Chest wall movement, Good air movement bilaterally, CTAB RRR,No Gallops,Rubs or new Murmurs,  +ve B.Sounds, Abd Soft, No tenderness,   No Cyanosis, Clubbing or edema      Data Review:    CBC Recent Labs  Lab 06/01/21 2340 06/02/21 0305  WBC 8.5 8.3  HGB 10.1* 10.1*  HCT 29.8* 30.3*  PLT 291 284  MCV 87.6 87.6   MCH 29.7 29.2  MCHC 33.9 33.3  RDW 14.2 14.2  LYMPHSABS 0.6* 0.3*  MONOABS 0.2 0.1  EOSABS 0.1 0.0  BASOSABS 0.1 0.0    Electrolytes Recent Labs  Lab 06/01/21 2340 06/02/21 0305  NA 139 139  K 4.6 4.7  CL 112* 110  CO2 17* 17*  GLUCOSE 117* 140*  BUN 49* 49*  CREATININE 4.07* 4.08*  CALCIUM 7.8* 8.1*  AST 107* 84*  ALT 110* 108*  ALKPHOS 93 85  BILITOT 1.1 1.0  ALBUMIN 3.5 3.5  MG  --  2.0  PROCALCITON  --  <0.10  BNP 1,118.3*  --     ------------------------------------------------------------------------------------------------------------------ No results for input(s): CHOL, HDL, LDLCALC, TRIG, CHOLHDL, LDLDIRECT in the last 72 hours.  No results found for: HGBA1C  No results for input(s): TSH, T4TOTAL, T3FREE, THYROIDAB in the last 72 hours.  Invalid input(s): FREET3 ------------------------------------------------------------------------------------------------------------------ ID Labs Recent Labs  Lab 06/01/21 2340 06/02/21 0305  WBC 8.5 8.3  PLT 291 284  PROCALCITON  --  <0.10  CREATININE 4.07* 4.08*   Cardiac Enzymes No results for input(s): CKMB, TROPONINI, MYOGLOBIN in the last 168 hours.  Invalid input(s): CK    Radiology Reports DG Chest 2 View  Result Date: 06/02/2021 CLINICAL DATA:  Shortness of breath EXAM: CHEST - 2 VIEW COMPARISON:  09/10/2011 FINDINGS: Mild left basilar opacity, favoring atelectasis, less likely pneumonia. No pleural effusion or pneumothorax. The heart is top-normal in size. Degenerative changes of the visualized thoracolumbar spine. IMPRESSION: Mild left basilar opacity, favoring atelectasis, less likely pneumonia. Electronically Signed   By: Julian Hy M.D.   On: 06/02/2021 00:43

## 2021-06-02 NOTE — ED Notes (Signed)
Patient transported to Ultrasound 

## 2021-06-02 NOTE — ED Provider Notes (Signed)
Emergency Department Provider Note  I have reviewed the triage vital signs and the nursing notes.  HISTORY  Chief Complaint Shortness of Breath   HPI Paul [name] is a 56 y.o. male with  56 year old male with a past medical history of hypertension, heart failure (last echo in our system was 2013 45-50%) who is not on any antihypertensives at this time the presents to the emerged from today with a weeks worth of progressively worsening dyspnea on exertion.  No orthopnea.  No noticeable lower extremity swelling.  Also has a history of stroke in his right eye.  No fevers or coughing.  No chest pain just feels like he has constricted breathing when he walks too far.  EMS was called who gave him some albuterol prior to arrival.  He is hypertensive at 204/130 on the way here with a heart rate of 106, 90% oxygen on room air brought in on 3 L nasal cannula.   Shortness of Breath  PMH Past Medical History:  Diagnosis Date   Bipolar disorder ([place])    Central retinal artery occlusion    with macular infarction of the right eye. status post posterior vitrectomy and photocoagulation with insertion of a shunt in 2006.   CHF (congestive heart failure) (HCC)    chronic mixed syst/diast. 06/7251 echo: Systolic function was mildly reduced. The estimated ejection fraction was in 45%, global HK, Grade I Diast dysfxn.   Heart failure    Obesity    Osteoarthritis    s/p right hip replacement in 2001   Schizophrenia ([place])    Severe uncontrolled hypertension     Home Medications Prior to Admission medications   Medication Sig Start Date End Date Taking? Authorizing Provider  fenofibrate 54 MG tablet Take 54 mg by mouth daily. Patient not taking: Reported on 06/02/2021    [provider]  lisinopril (ZESTRIL) 40 MG tablet Take 1 tablet (40 mg total) by mouth daily. Patient not taking: Reported on 06/02/2021 11/03/19   Dewayne Hatch, MD  metoprolol tartrate (LOPRESSOR) 100 MG  tablet Take 1 tablet (100 mg total) by mouth 2 (two) times daily. Patient not taking: Reported on 06/02/2021 11/03/19   Dewayne Hatch, MD    Social History Social History   Tobacco Use   Smoking status: Former   Smokeless tobacco: Never   Tobacco comments:    Quit smoking 20-30 years ago, smoked for 1 year.  Substance Use Topics   Alcohol use: No   Drug use: No    Review of Systems: Documented in HPI ____________________________________________  PHYSICAL EXAM: VITAL SIGNS: ED Triage Vitals  Enc Vitals Group     BP 06/01/21 2255 (!) 185/116     Pulse Rate 06/01/21 2255 (!) 109     Resp 06/01/21 2255 (!) 22     Temp 06/01/21 2311 98 F (36.7 C)     Temp Source 06/01/21 2311 Oral     SpO2 06/01/21 2255 98 %     Weight 06/01/21 2312 185 lb (83.9 kg)     Height 06/01/21 2312 5\' 3"  (1.6 m)   Physical Exam Vitals and nursing note reviewed.  Constitutional:      Appearance: He is well-developed.  HENT:     Head: Normocephalic and atraumatic.  Cardiovascular:     Rate and Rhythm: Normal rate.  Pulmonary:     Effort: Pulmonary effort is normal. No respiratory distress.     Breath sounds: Examination of the right-lower field reveals rales. Examination  of the left-lower field reveals rales. Wheezing and rales present.  Chest:     Chest wall: No mass or tenderness.  Abdominal:     General: There is no distension.  Musculoskeletal:        General: Normal range of motion.     Cervical back: Normal range of motion.     Right lower leg: Edema present.     Left lower leg: Edema present.  Skin:    General: Skin is warm and dry.  Neurological:     General: No focal deficit present.     Mental Status: He is alert.   ____________________________________________   LABS (all labs ordered are listed, but only abnormal results are displayed)  Labs Reviewed  CBC WITH DIFFERENTIAL/PLATELET - Abnormal; Notable for the following components:      Result Value   RBC 3.40 (*)     Hemoglobin 10.1 (*)    HCT 29.8 (*)    Lymphs Abs 0.6 (*)    All other components within normal limits  COMPREHENSIVE METABOLIC PANEL - Abnormal; Notable for the following components:   Chloride 112 (*)    CO2 17 (*)    Glucose, Bld 117 (*)    BUN 49 (*)    Creatinine, Ser 4.07 (*)    Calcium 7.8 (*)    AST 107 (*)    ALT 110 (*)    GFR, Estimated 16 (*)    All other components within normal limits  BRAIN NATRIURETIC PEPTIDE - Abnormal; Notable for the following components:   B Natriuretic Peptide 1,118.3 (*)    All other components within normal limits  TROPONIN I (HIGH SENSITIVITY) - Abnormal; Notable for the following components:   Troponin I (High Sensitivity) 128 (*)    All other components within normal limits  RESP PANEL BY RT-PCR (FLU A&B, COVID) [id number]   ____________________________________________  EKG   EKG Interpretation  Date/Time:  Friday June 01 2021 23:04:49 EST Ventricular Rate:  104 PR Interval:  188 QRS Duration: 99 QT Interval:  361 QTC Calculation: 475 R Axis:   -54 Text Interpretation: Sinus tachycardia Left atrial enlargement Left anterior fascicular block Probable anteroseptal infarct, old Confirmed by Merrily Pew [phone]) on 06/01/2021 11:21:36 PM        ____________________________________________  RADIOLOGY  DG Chest 2 View  Result Date: 06/02/2021 CLINICAL DATA:  Shortness of breath EXAM: CHEST - 2 VIEW COMPARISON:  09/10/2011 FINDINGS: Mild left basilar opacity, favoring atelectasis, less likely pneumonia. No pleural effusion or pneumothorax. The heart is top-normal in size. Degenerative changes of the visualized thoracolumbar spine. IMPRESSION: Mild left basilar opacity, favoring atelectasis, less likely pneumonia. Electronically Signed   By: Julian Hy M.D.   On: 06/02/2021 00:43   ____________________________________________  PROCEDURES  Procedure(s) performed:   .1-3 Lead EKG Interpretation Performed by: Merrily Pew, MD Authorized by: Merrily Pew, MD     Interpretation: normal     ECG rate:  104   ECG rate assessment: tachycardic     Rhythm: sinus tachycardia     Ectopy: none     Conduction: normal   .1-3 Lead EKG Interpretation Performed by: Merrily Pew, MD Authorized by: Merrily Pew, MD     Interpretation: normal     ECG rate:  89   ECG rate assessment: normal     Rhythm: sinus rhythm     Ectopy: none     Conduction: normal   .Critical Care Performed by: Merrily Pew, MD Authorized by: Dayna Barker,  Corene Cornea, MD   Critical care provider statement:    Critical care time (minutes):  30   Critical care time was exclusive of:  Separately billable procedures and treating other patients   Critical care was necessary to treat or prevent imminent or life-threatening deterioration of the following conditions:  Respiratory failure, circulatory failure and cardiac failure   Critical care was time spent personally by me on the following activities:  Development of treatment plan with patient or surrogate, discussions with consultants, evaluation of patient's response to treatment, examination of patient, ordering and review of laboratory studies, ordering and review of radiographic studies, ordering and performing treatments and interventions, pulse oximetry, re-evaluation of patient's condition and review of old charts ____________________________________________  INITIAL IMPRESSION / ASSESSMENT AND PLAN   This patient presents to the ED for concern of shortness of breath and dyspnea on exertion, this involves an extensive number of treatment options, and is a complaint that carries with it a high risk of complications and morbidity.  The differential diagnosis includes ACS, hypertensive crisis with pulmonary edema, pulmonary embolus, bronchitis. Will start with chest x-ray of is consistent with heart failure which is suspected will be will use that as a working diagnosis.  If that is relatively  clear does not explain his symptoms we will probably do a CT scan to rule out PE.  Pending chest x-ray before initiating any therapeutics at this time  Additional history obtained:  Additional history obtained from EMS Previous records obtained and reviewed in epic  Co morbidities that complicate the patient evaluation  Stroke, heart failure, schizophrenia  Social Determinants of Health:  Previous incarceration, poor understanding of disease  ED Course  Images ordered viewed and obtained by myself. Agree with Radiology interpretation. Details in ED course.  Labs ordered reviewed by myself as detailed in ED course.  Consultations obtained/considered detailed in ED course.   Clinical Course as of 06/02/21 0228  Fri Jun 01, 2021  2319 Pulse Rate(!): 109 [JM]  2319 BP(!): 185/116 C/w HTN crisis and possibly pulm edema [JM]  2320 O2 Flow Rate (L/min): 3 L/min Not home? [JM]  2320 Resp(!): 24 [JM]  Sat Jun 02, 2021  0021 DG Chest 2 View XR with what appear to be likely vascular congestion. No large pleural effusions or overt pulmonary edema. Will add on ct pe.  [JM]  0145 Troponin I (High Sensitivity)(!!): 128 Likely related to his heart failure [JM]  0145 B Natriuretic Peptide(!): 1,118.3 Likely related to heart failure, Lasix ordered.  I think is probably related to his longstanding hypertension has been untreated. [JM]  0145 GFR, Estimated(!): 16 From what I can tell this is new.  Patient never been told he is having problems with his kidneys in the past. [JM]  0146 Creatinine(!): 4.07 [JM]  0146 BUN(!): 49 [JM]  0146 CO2(!): 17 All related to all new.  Is unclear he has had controlled hypertension that has led to heart failure or kidney failure or both.  We will need to admit him to the hospital for further management work-up.  Lasix and labetalol already ordered. [JM]  0146 Pulse Rate: 76 Improved significantly with labetalol [JM]  0146 BP(!): 144/100 Improved with  labetalol no further management required at this time. [JM]    Clinical Course User Index [JM] Hitesh Fouche, Corene Cornea, MD      Cardiac Monitoring:  The patient was maintained on a cardiac monitor.  I personally viewed and interpreted the cardiac monitored which showed an underlying rhythm  of: sinus tachycardia  CRITICAL INTERVENTIONS:  Labetalol for hypertension Lasix for fluid overload  Reevaluation:  After the interventions noted above, I reevaluated the patient and found that they have :improved  FINAL IMPRESSION AND PLAN Final diagnoses:  Dyspnea, unspecified type  Acute renal failure superimposed on stage 4 chronic kidney disease, unspecified acute renal failure type ([place])  Congestive heart failure, unspecified HF chronicity, unspecified heart failure type [facility])    Medical screening exam was performed and I feel the patient has had appropriate emergency department evaluation and work-up for their chief complaint and is stable for ADMISSION to the hospitalist time.  I discussed with Dr. Velia Meyer with the [facility] service and discussed labs, imaging and other work-up in the emergency room.  They agree to admission for further management and work-up of said condition. ____________________________________________   NEW OUTPATIENT MEDICATIONS STARTED DURING THIS VISIT:  New Prescriptions   No medications on file    Note:  This note was prepared with assistance of Dragon voice recognition software. Occasional wrong-word or sound-a-like substitutions may have occurred due to the inherent limitations of voice recognition software.    Merrily Pew, MD 06/02/21 [phone]

## 2021-06-02 NOTE — H&P (Signed)
History and Physical    PLEASE NOTE THAT DRAGON DICTATION SOFTWARE WAS USED IN THE CONSTRUCTION OF THIS NOTE.   Paul [name] JKD:[id number] DOB: 12-24-1965 DOA: 06/01/2021  PCP: No primary care provider on file. (Will need to be further addressed) Patient coming from: home   I have personally briefly reviewed patient's old medical records in [place]  Chief Complaint: Shortness of breath  HPI: Paul [name] is a 56 y.o. male with medical history significant for chronic systolic/diastolic heart failure, hypertension, stage IIIa chronic kidney disease, who is admitted to [facility] on 06/01/2021 with acute on chronic systolic/diastolic heart failure after presenting from home to [facility] ED complaining of shortness of breath.   The patient reports 1 week of progressive shortness of breath associated with orthopnea, worsening of edema in the bilateral lower extremities, but denies associated PND.  Denies any associated chest pain, diaphoresis, palpitations, nausea, vomiting, presyncope, or syncope. Not associated with any recent cough, wheezing, hemoptysis, new lower extremity erythema, or calf tenderness. Denies any recent trauma, travel, surgical procedures, or periods of prolonged diminished ambulatory status. No recent melena or hematochezia.   Denies any associated subjective fever, chills, rigors, or generalized myalgias. No recent headache, neck stiffness, rhinitis, rhinorrhea, sore throat, abdominal pain, diarrhea, or rash.  Denies dysuria, gross hematuria, or change in urinary urgency/frequency.  He reports that he does continue to make urine.  He has a documented history of stage IIIa chronic kidney disease, with baseline serum creatinine of approximately 1.5, with most recent prior serum creatinine data point #to be 1.45 in December 2014.  Medical history also notable for chronic systolic/diastolic heart failure, with chart review revealing documentation of  echocardiogram in October 2010 reportedly showing LVEF 45% along with grade 1 diastolic dysfunction.  Per additional chart review, echo in May 2013 notable for LVEF 45 to 50%, diffuse hypokinesis, mild LVH, and indeterminate diastolic function.   Reports no known baseline supplemental oxygen requirements, as well as no known chronic underlying pulmonary pathology.  He reports that he smoked 1 pack of cigarettes per day over the course of 1 year, but completely quit smoking approximately 30 years ago, without any subsequent resumption.  He acknowledges a history of essential pretension, for which she was prescribed lisinopril and Lopressor, but notes suboptimal compliance with these medications, reporting that he is self discontinued all of his outpatient medications over the last 3 to 4 months.  Denies a history of being prescribed a diuretic medications as an outpatient.     ED Course:  Vital signs in the ED were notable for the following: Afebrile; initial heart rate 109, with ensuing heart rate decreasing to 81-96 following initiation of IV diuresis efforts, as further detailed below; initial blood pressure 184/109, now down to 151/94 following interval IV Lasix as well as IV labetalol, as quantified below; respiratory rate 17-24, initial oxygen saturation 89% on room air, with ensuing improvement to 95 to 97% on 3 L nasal cannula.  Labs were notable for the following: BMP notable for the following: Potassium 4.6, bicarbonate 17, anion gap 10, creatinine 4.07.  BNP 1100, without any prior BNP data point available for point comparison; high-sensitivity troponin I 128, without any prior troponin data points available to comparison.  CBC notable for will was about 8500.  COVID-19/influenza PCR were checked in the ED today, with results, pending.  Imaging and additional notable ED work-up: EKG shows sinus tachycardia with heart rate 104, normal intervals, and no evidence of  T wave or ST changes,  including no evidence of ST elevation.  Chest x-ray shows left basilar airspace opacity suggestive of atelectasis versus infiltrate, in the absence of any evidence of pleural effusion or pneumothorax.  While in the ED, the following were administered: Lasix 40 mg IV x1, labetalol 5 mg IV x1.  Subsequently, the patient was admitted to the cardiac telemetry unit for further evaluation and management of suspected acute on chronic systolic/diastolic heart failure complicated by acute hypoxic respiratory distress and suspected AKI on stage IIIa CKD.     Review of Systems: As per HPI otherwise 10 point review of systems negative.   Past Medical History:  Diagnosis Date   Bipolar disorder ([place])    Central retinal artery occlusion    with macular infarction of the right eye. status post posterior vitrectomy and photocoagulation with insertion of a shunt in 2006.   CHF (congestive heart failure) (HCC)    chronic mixed syst/diast. 44/8185 echo: Systolic function was mildly reduced. The estimated ejection fraction was in 45%, global HK, Grade I Diast dysfxn.   Heart failure    Obesity    Osteoarthritis    s/p right hip replacement in 2001   Schizophrenia ([place])    Severe uncontrolled hypertension     Past Surgical History:  Procedure Laterality Date   JOINT REPLACEMENT     right hip replacement   right eye surgery     for glaucoma   TOTAL HIP ARTHROPLASTY Bilateral 1997, 2001    Social History:  reports that he has quit smoking. He has never used smokeless tobacco. He reports that he does not drink alcohol and does not use drugs.   Allergies  Allergen Reactions   Penicillins Other (See Comments)    childhood    Family History  Problem Relation Age of Onset   Hypertension Father    Stroke Father     Family history reviewed and not pertinent    Prior to Admission medications   Medication Sig Start Date End Date Taking? Authorizing Provider  fenofibrate 54 MG tablet Take 54  mg by mouth daily. Patient not taking: Reported on 06/02/2021    [provider]  lisinopril (ZESTRIL) 40 MG tablet Take 1 tablet (40 mg total) by mouth daily. Patient not taking: Reported on 06/02/2021 11/03/19   Dewayne Hatch, MD  metoprolol tartrate (LOPRESSOR) 100 MG tablet Take 1 tablet (100 mg total) by mouth 2 (two) times daily. Patient not taking: Reported on 06/02/2021 11/03/19   Dewayne Hatch, MD     Objective    Physical Exam: Vitals:   06/02/21 0145 06/02/21 0200 06/02/21 0215 06/02/21 0300  BP: (!) 162/114 (!) 165/115 (!) 157/114 (!) 157/108  Pulse: 88 96 91 80  Resp: 17 18 19  (!) 22  Temp:      TempSrc:      SpO2: 97% 99% 98% 98%  Weight:      Height:        General: appears to be stated age; alert, oriented; mildly increased work of breathing noted Skin: warm, dry, no rash Head:  AT/[place] Mouth:  Oral mucosa membranes appear moist, normal dentition Neck: supple; trachea midline Heart:  RRR; did not appreciate any M/R/G Lungs: Bibasilar rales noted, but otherwise CTAB, did not appreciate any wheezes, or rhonchi Abdomen: + BS; soft, ND, NT Vascular: 2+ pedal pulses b/l; 2+ radial pulses b/l Extremities: Trace edema in the bilateral lower extremities, no muscle wasting Neuro: strength and sensation intact in  upper and lower extremities b/l    Labs on Admission: I have personally reviewed following labs and imaging studies  CBC: Recent Labs  Lab 06/01/21 2340 06/02/21 0305  WBC 8.5 8.3  NEUTROABS 7.5 7.9*  HGB 10.1* 10.1*  HCT 29.8* 30.3*  MCV 87.6 87.6  PLT 291 970   Basic Metabolic Panel: Recent Labs  Lab 06/01/21 2340  NA 139  K 4.6  CL 112*  CO2 17*  GLUCOSE 117*  BUN 49*  CREATININE 4.07*  CALCIUM 7.8*   GFR: Estimated Creatinine Clearance: 19.2 mL/min (A) (by C-G formula based on SCr of 4.07 mg/dL (H)). Liver Function Tests: Recent Labs  Lab 06/01/21 2340  AST 107*  ALT 110*  ALKPHOS 93  BILITOT 1.1  PROT  7.0  ALBUMIN 3.5   No results for input(s): LIPASE, AMYLASE in the last 168 hours. No results for input(s): AMMONIA in the last 168 hours. Coagulation Profile: No results for input(s): INR, PROTIME in the last 168 hours. Cardiac Enzymes: No results for input(s): CKTOTAL, CKMB, CKMBINDEX, TROPONINI in the last 168 hours. BNP (last 3 results) No results for input(s): PROBNP in the last 8760 hours. HbA1C: No results for input(s): HGBA1C in the last 72 hours. CBG: No results for input(s): GLUCAP in the last 168 hours. Lipid Profile: No results for input(s): CHOL, HDL, LDLCALC, TRIG, CHOLHDL, LDLDIRECT in the last 72 hours. Thyroid Function Tests: No results for input(s): TSH, T4TOTAL, FREET4, T3FREE, THYROIDAB in the last 72 hours. Anemia Panel: No results for input(s): VITAMINB12, FOLATE, FERRITIN, TIBC, IRON, RETICCTPCT in the last 72 hours. Urine analysis:    Component Value Date/Time   COLORURINE YELLOW 04/20/2013 1944   APPEARANCEUR CLEAR 04/20/2013 1944   LABSPEC 1.014 04/20/2013 1944   PHURINE 6.0 04/20/2013 1944   GLUCOSEU NEGATIVE 04/20/2013 1944   HGBUR TRACE (A) 04/20/2013 1944   BILIRUBINUR NEGATIVE 04/20/2013 1944   KETONESUR NEGATIVE 04/20/2013 1944   PROTEINUR >300 (A) 04/20/2013 1944   UROBILINOGEN 0.2 04/20/2013 1944   NITRITE NEGATIVE 04/20/2013 1944   LEUKOCYTESUR NEGATIVE 04/20/2013 1944    Radiological Exams on Admission: DG Chest 2 View  Result Date: 06/02/2021 CLINICAL DATA:  Shortness of breath EXAM: CHEST - 2 VIEW COMPARISON:  09/10/2011 FINDINGS: Mild left basilar opacity, favoring atelectasis, less likely pneumonia. No pleural effusion or pneumothorax. The heart is top-normal in size. Degenerative changes of the visualized thoracolumbar spine. IMPRESSION: Mild left basilar opacity, favoring atelectasis, less likely pneumonia. Electronically Signed   By: Julian Hy M.D.   On: 06/02/2021 00:43     EKG: Independently reviewed, with result as  described above.    Assessment/Plan   Principal Problem:   Acute on chronic combined systolic and diastolic CHF (congestive heart failure) (HCC) Active Problems:   Essential hypertension   SOB (shortness of breath)   Acute renal failure superimposed on stage 3a chronic kidney disease (HCC)   Elevated troponin     #) Acute on chronic systolic/diastolic heart failure: dx of acute decompensation on the basis of presenting 1 week of progressive shortness of breath associated with orthopnea, worsening edema in the bilateral extremities, elevated BNP. This is in the context of a known history of chronic systolic/diastolic heart failure, with most recent echocardiogram performed in May 2013 notable for LVEF 45 to 50%, with prior documentation of echo from October 2010 showing similar LVEF, also noting grade 1 diastolic dysfunction. Etiology leading to presenting acutely decompensated heart failure is currently unclear, but includes hypertensive cardiomyopathy including  element of suboptimal compliance with outpatient hypertensive medications resulting in suboptimal management of afterload reduction efforts. I suspect that mildly elevated initial troponin is a consequence of underlying acutely decompensated heart failure as opposed to representing ACS causing presenting acute heart failure exacerbation, particularly in the absence of any recent CP, and with presenting EKG showing no evidence of acute ischemic changes. However, will continue to evaluate with further trending of troponin and close monitoring on tele.  Additionally, suspect contribution towards elevated troponin from diminished renal clearance in the setting of AKI on CKD 3A, as further detailed below.  Not currently on a diuretic medications at home, while acknowledging independent discontinuation of beta-blocker and lisinopril over the last 3 to 4 months.  Would benefit from improvement in afterload reduction.  Of note, patient received  Lasix 40 mg IV x1 while in the ED today. Presentation warrants additional IV diuresis, as further detailed below, with close monitoring of ensuing renal function, electrolytes, and volume status. Will also closely monitor ensuing HR as an additional means to evaluate volume status and help guide subsequent diuresis decision-making. As patient has not been compliant with his Lopressor over the last 3 to 4 months, at least, will refrain from resumption of beta-blocker at this time in setting of presenting acutely decompensated heart failure.   Plan: monitor strict I's & O's and daily weights. Monitor on telemetry, including trend in HR in response to diuresis, as above. Monitor continuous pulse oximetry. Repeat BMP in the morning, including for monitoring trend of potassium, bicarbonate, and renal function in response to interval diuresis efforts. Add-on serum magnesium level, and repeat this level in the AM. Close monitoring of ensuing blood pressure response to diuresis efforts, including to help guide need for improvement in afterload reduction in order to optimize cardiac output. Trend troponin. Lasix 40 mg IV twice daily.  Holding home beta-blocker for now, as above.  In addition, in the setting of AKI, will hold home lisinopril for now as well.  Trend troponin.  Echocardiogram ordered for the morning.  Add on serum ethanol level.  Check UDS.        #) Acute hypoxic respiratory distress: in the context of acute respiratory symptoms and no known baseline supplemental O2 requirements, presenting O2 sat noted to be in the high 80s on room air, subsequent proving into the mid to high 90s on 3 L nasal cannula, thereby meeting criteria for acute hypoxic respiratory distress as opposed to acute hypoxic respiratory failure at this time. Appears to be on basis of acute on chronic systolic/diastolic heart failure, as above, including chest x-ray findings as discussed above, which demonstrate no evidence of  pneumothorax. No known chronic underlying pulmonary conditions.  In terms of other considered etiologies, ACS appears less likely at this time, as above.  Clinically, presentation is less suggestive of acute PE at this time. COVID-19/Influenza PCR results are currently pending.     Plan: further evaluation/management of presenting acute on chronic systolic/diastolic heart failure, as above. Monitor continuous pulse ox with prn supplemental O2 to maintain O2 sats greater than or equal to 92%. monitor on telemetry. CMP/CBC in the AM. Check serum Mg and Phos levels. incentive spirometry.  Follow-up results of COVID-19/influenza PCR.  Add on procalcitonin level.  Trend troponin.       #) AKI on CKD 3A: In setting of a documented history of CKD 3A, with reported baseline creatinine of 1.5, and associated most recent prior serum creatinine data point of 1.45 from 2014,  presenting serum creatinine found to be 4.07.  This is presumed to represent an acute kidney injury but likely prerenal in nature, in the setting of diminished renal perfusion gradient as a consequence of presenting acutely decompensated heart failure, as above.  However, given the nearly 10-year gap in serum creatinine data points, it is possible that elevated presenting creatinine may represent gradual worsening of baseline renal function over that timeframe, which would likely be on the basis of hypertensive nephropathy given the patient's above history.  Will continue IV diuresis efforts, as above, and closely monitor ensuing renal function response to this.    Plan: Check urinalysis with microscopy.  Add on random urine sodium as well as random urine creatinine.  Monitor strict I's and O's diabetes.  Tempt avoid nephrotoxic agents.  Hold outpatient lisinopril.  IV Lasix, as above.         #) Essential Hypertension: documented h/o such, with outpatient antihypertensive regimen including lisinopril and metoprolol tartrate,  although the patient acknowledges that he has self discontinued these medications over the last 3 to 4 months, as above.  SBP's in the ED today, as outlined above.  Holding home beta-blocker in the setting of acutely decompensated heart failure and suboptimal outpatient compliance.  Additionally, holding home lisinopril in the context of suspected presenting AKI, as above.  Plan: Close monitoring of subsequent BP via routine VS. holding home beta-blocker and ACE inhibitor, as above.  As needed IV hydralazine.      DVT prophylaxis: SCD's   Code Status: Full code Family Communication: none Disposition Plan: Per Rounding Team Consults called: none;  Admission status: Inpatient; cardiac telemetry  Warrants inpatient status on basis of need for further evaluation management acute on chronic systolic/diastolic heart failure, including need for IV diuresis efforts, as above, as well as close monitoring of interval renal function and response to these efforts in the context of suspected presenting AKI on CKD 3A.   PLEASE NOTE THAT DRAGON DICTATION SOFTWARE WAS USED IN THE CONSTRUCTION OF THIS NOTE.   [place] DO [facility]  From [place]   06/02/2021, 3:20 AM

## 2021-06-02 NOTE — ED Notes (Signed)
PT in progress.

## 2021-06-02 NOTE — Progress Notes (Signed)
°  Echocardiogram 2D Echocardiogram has been performed.  Johny Chess 06/02/2021, 8:37 AM

## 2021-06-02 NOTE — Progress Notes (Signed)
°   06/02/21 2125  Clinical Encounter Type  Visited With Patient  Visit Type Initial;Spiritual support  Referral From Nurse  Consult/Referral To None   Chaplain responded to a call from the nurse on [place].  Patient was requesting a Bible which I brought to him. Patient shared some of his challenges and aspirations. I left the patient with his reading and thoughts.   [place] Resident [facility] [phone]

## 2021-06-02 NOTE — ED Notes (Signed)
Pt to ECHO

## 2021-06-03 ENCOUNTER — Inpatient Hospital Stay (HOSPITAL_COMMUNITY): Payer: Medicaid Other

## 2021-06-03 LAB — COMPREHENSIVE METABOLIC PANEL
ALT: 81 U/L — ABNORMAL HIGH (ref 0–44)
AST: 45 U/L — ABNORMAL HIGH (ref 15–41)
Albumin: 3.1 g/dL — ABNORMAL LOW (ref 3.5–5.0)
Alkaline Phosphatase: 86 U/L (ref 38–126)
Anion gap: 12 (ref 5–15)
BUN: 60 mg/dL — ABNORMAL HIGH (ref 6–20)
CO2: 20 mmol/L — ABNORMAL LOW (ref 22–32)
Calcium: 7.7 mg/dL — ABNORMAL LOW (ref 8.9–10.3)
Chloride: 105 mmol/L (ref 98–111)
Creatinine, Ser: 4.38 mg/dL — ABNORMAL HIGH (ref 0.61–1.24)
GFR, Estimated: 15 mL/min — ABNORMAL LOW (ref >60)
Glucose, Bld: 117 mg/dL — ABNORMAL HIGH (ref 70–99)
Potassium: 4.4 mmol/L (ref 3.5–5.1)
Sodium: 137 mmol/L (ref 135–145)
Total Bilirubin: 0.6 mg/dL (ref 0.3–1.2)
Total Protein: 6.4 g/dL — ABNORMAL LOW (ref 6.5–8.1)

## 2021-06-03 LAB — CBC WITH DIFFERENTIAL/PLATELET
Abs Immature Granulocytes: 0.04 10*3/uL (ref 0.00–0.07)
Basophils Absolute: 0 10*3/uL (ref 0.0–0.1)
Basophils Relative: 0 %
Eosinophils Absolute: 0 10*3/uL (ref 0.0–0.5)
Eosinophils Relative: 0 %
HCT: 27.1 % — ABNORMAL LOW (ref 39.0–52.0)
Hemoglobin: 9.1 g/dL — ABNORMAL LOW (ref 13.0–17.0)
Immature Granulocytes: 0 %
Lymphocytes Relative: 8 %
Lymphs Abs: 0.9 10*3/uL (ref 0.7–4.0)
MCH: 29.3 pg (ref 26.0–34.0)
MCHC: 33.6 g/dL (ref 30.0–36.0)
MCV: 87.1 fL (ref 80.0–100.0)
Monocytes Absolute: 0.7 10*3/uL (ref 0.1–1.0)
Monocytes Relative: 6 %
Neutro Abs: 10.1 10*3/uL — ABNORMAL HIGH (ref 1.7–7.7)
Neutrophils Relative %: 86 %
Platelets: 287 10*3/uL (ref 150–400)
RBC: 3.11 MIL/uL — ABNORMAL LOW (ref 4.22–5.81)
RDW: 14.4 % (ref 11.5–15.5)
WBC: 11.8 10*3/uL — ABNORMAL HIGH (ref 4.0–10.5)
nRBC: 0 % (ref 0.0–0.2)

## 2021-06-03 LAB — MAGNESIUM: Magnesium: 2 mg/dL (ref 1.7–2.4)

## 2021-06-03 LAB — BRAIN NATRIURETIC PEPTIDE: B Natriuretic Peptide: 973 pg/mL — ABNORMAL HIGH (ref 0.0–100.0)

## 2021-06-03 MED ORDER — ALBUTEROL SULFATE (2.5 MG/3ML) 0.083% IN NEBU
2.5000 mg | INHALATION_SOLUTION | Freq: Once | RESPIRATORY_TRACT | Status: AC
  Administered 2021-06-03: 2.5 mg via RESPIRATORY_TRACT
  Filled 2021-06-03: qty 3

## 2021-06-03 NOTE — Progress Notes (Signed)
°  Transition of Care [facility]) Screening Note   Patient Details  Name: Paul [name] Date of Birth: Aug 08, 1965   Transition of [facility]) CM/SW Contact:    Bary Castilla, LCSW Phone Number: 06/03/2021, 11:12 AM    Transition of Care Department [facility]) has reviewed patient and no TOC needs have been identified at this time. We will continue to monitor patient advancement through interdisciplinary progression rounds. If new patient transition needs arise, please place a TOC consult.

## 2021-06-03 NOTE — Progress Notes (Signed)
[facility] Progress Note  Subjective: seen in room, DOE improving. Voiding a lot. 1 L out yest and 1.6 L out today so far. Creat up 4.3 today.   Vitals:   06/03/21 0253 06/03/21 0558 06/03/21 0835 06/03/21 0914  BP: (!) 142/96   (!) 137/92  Pulse: 70     Resp: 20   14  Temp: 97.7 F (36.5 C)   97.7 F (36.5 C)  TempSrc: Oral   Oral  SpO2: 97%  96%   Weight:  81.6 kg    Height:        Exam: Gen alert, no distress No rash, cyanosis or gangrene Sclera anicteric, throat clear  +JVD to angle of the jaw Chest clear bilat to bases, occ wheezing RRR no RG Abd soft ntnd no mass or ascites +bs GU normal male MS no joint effusions or deformity Ext no LE edema, no other edema Neuro is alert, Ox 3 , nf, no asterixis            Date                          Creat               eGFR   2009                          1.4   2010                          1.4- 1.92          42- 58 ml/min, IIIa   2013                          1.50- 1.61        53- 57 ml/min, IIIa   2014                          1.45                 59 ml/min   06/01/21                      4.07                 16 ml/min            06/02/21                     4.08    Home meds include - fenofibrate, lisinopril, metoprolol 100 bid      UA prot 100, 0-5 rbc/ wbc   UNa 103,  UCr 23   Renal US - 8.6/ 9.2 cm kidneys w/o hydro, +bilat ureteral jets, ^'d echo bilat    CXR 1/28 - IMPRESSION: Mild left basilar opacity, favoring atelectasis, less likely pneumonia   ECHO - LVEF 30-35%, G2DD. Focal wma's of inf / inferoseptal and inferolateral wall. No LVH. RV appears wnl.      Assessment/ Plan: AKI on CKD 3a - last labs here were in 2014 at [facility].  Pt was in prison 2015- 2020 where he was told his kidneys "were failing". Suspect renal failure is chronic damage primarily due to uncontrolled severe HTN. Likely CKD 4/5 at this point.  UA negative, renal US w/ chronic  changes. No indication for RRT at this time.  Will  cont IV lasix 40 bid as DOE improving. Creat up slightly. Will follow.  HTN - BP's controlled w/ coreg 6.25 bid and IV lasix HFrEF - echo here LVEF 30-3% which is worse that prior. CXR w/ vasc congestion. Diuresing as above. Per pmd.       Kelly Splinter 06/03/2021, 10:05 AM   Recent Labs  Lab 06/02/21 0305 06/03/21 0057  K 4.7 4.4  BUN 49* 60*  CREATININE 4.08* 4.38*  CALCIUM 8.1* 7.7*  PHOS 4.5  --   HGB 10.1* 9.1*   Inpatient medications:  carvedilol  6.25 mg Oral BID WC   furosemide  40 mg Intravenous Q12H   heparin injection (subcutaneous)  5,000 Units Subcutaneous Q8H   isosorbide mononitrate  60 mg Oral Daily    acetaminophen **OR** acetaminophen, albuterol, hydrALAZINE

## 2021-06-04 ENCOUNTER — Encounter (HOSPITAL_COMMUNITY): Payer: Self-pay | Admitting: Internal Medicine

## 2021-06-04 DIAGNOSIS — N1831 Chronic kidney disease, stage 3a: Secondary | ICD-10-CM

## 2021-06-04 DIAGNOSIS — I5043 Acute on chronic combined systolic (congestive) and diastolic (congestive) heart failure: Secondary | ICD-10-CM | POA: Diagnosis not present

## 2021-06-04 DIAGNOSIS — N170 Acute kidney failure with tubular necrosis: Secondary | ICD-10-CM

## 2021-06-04 LAB — COMPREHENSIVE METABOLIC PANEL
ALT: 78 U/L — ABNORMAL HIGH (ref 0–44)
AST: 38 U/L (ref 15–41)
Albumin: 3.5 g/dL (ref 3.5–5.0)
Alkaline Phosphatase: 88 U/L (ref 38–126)
Anion gap: 12 (ref 5–15)
BUN: 69 mg/dL — ABNORMAL HIGH (ref 6–20)
CO2: 20 mmol/L — ABNORMAL LOW (ref 22–32)
Calcium: 7.4 mg/dL — ABNORMAL LOW (ref 8.9–10.3)
Chloride: 104 mmol/L (ref 98–111)
Creatinine, Ser: 4.97 mg/dL — ABNORMAL HIGH (ref 0.61–1.24)
GFR, Estimated: 13 mL/min — ABNORMAL LOW (ref >60)
Glucose, Bld: 97 mg/dL (ref 70–99)
Potassium: 4.2 mmol/L (ref 3.5–5.1)
Sodium: 136 mmol/L (ref 135–145)
Total Bilirubin: 0.7 mg/dL (ref 0.3–1.2)
Total Protein: 7.2 g/dL (ref 6.5–8.1)

## 2021-06-04 LAB — CBC WITH DIFFERENTIAL/PLATELET
Abs Immature Granulocytes: 0.07 10*3/uL (ref 0.00–0.07)
Basophils Absolute: 0.1 10*3/uL (ref 0.0–0.1)
Basophils Relative: 1 %
Eosinophils Absolute: 0.3 10*3/uL (ref 0.0–0.5)
Eosinophils Relative: 3 %
HCT: 28.8 % — ABNORMAL LOW (ref 39.0–52.0)
Hemoglobin: 9.8 g/dL — ABNORMAL LOW (ref 13.0–17.0)
Immature Granulocytes: 1 %
Lymphocytes Relative: 21 %
Lymphs Abs: 2.2 10*3/uL (ref 0.7–4.0)
MCH: 29.4 pg (ref 26.0–34.0)
MCHC: 34 g/dL (ref 30.0–36.0)
MCV: 86.5 fL (ref 80.0–100.0)
Monocytes Absolute: 0.7 10*3/uL (ref 0.1–1.0)
Monocytes Relative: 7 %
Neutro Abs: 7.2 10*3/uL (ref 1.7–7.7)
Neutrophils Relative %: 67 %
Platelets: 259 10*3/uL (ref 150–400)
RBC: 3.33 MIL/uL — ABNORMAL LOW (ref 4.22–5.81)
RDW: 14.4 % (ref 11.5–15.5)
WBC: 10.6 10*3/uL — ABNORMAL HIGH (ref 4.0–10.5)
nRBC: 0 % (ref 0.0–0.2)

## 2021-06-04 LAB — BRAIN NATRIURETIC PEPTIDE: B Natriuretic Peptide: 683.2 pg/mL — ABNORMAL HIGH (ref 0.0–100.0)

## 2021-06-04 LAB — HEMOGLOBIN A1C
Hgb A1c MFr Bld: 5.5 % (ref 4.8–5.6)
Mean Plasma Glucose: 111.15 mg/dL

## 2021-06-04 LAB — IRON AND TIBC
Iron: 65 ug/dL (ref 45–182)
Saturation Ratios: 23 % (ref 17.9–39.5)
TIBC: 286 ug/dL (ref 250–450)
UIBC: 221 ug/dL

## 2021-06-04 LAB — MAGNESIUM: Magnesium: 2 mg/dL (ref 1.7–2.4)

## 2021-06-04 LAB — FERRITIN: Ferritin: 228 ng/mL (ref 24–336)

## 2021-06-04 MED ORDER — HYDRALAZINE HCL 50 MG PO TABS
50.0000 mg | ORAL_TABLET | Freq: Three times a day (TID) | ORAL | Status: DC
  Administered 2021-06-04 – 2021-06-05 (×5): 50 mg via ORAL
  Filled 2021-06-04 (×5): qty 1

## 2021-06-04 MED ORDER — ASPIRIN 81 MG PO CHEW
81.0000 mg | CHEWABLE_TABLET | Freq: Every day | ORAL | Status: DC
  Administered 2021-06-04 – 2021-06-05 (×2): 81 mg via ORAL
  Filled 2021-06-04 (×2): qty 1

## 2021-06-04 NOTE — Consult Note (Signed)
Cardiology Consultation:   Patient ID: DEDRIC ETHINGTON MRN: [id number]; DOB: 11-Jan-1966  Admit date: 06/01/2021 Date of Consult: 06/04/2021  PCP:  No primary care provider on file.   [facility] Providers Cardiologist:  None        Patient Profile:   Paul [name] is a 56 y.o. 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  EF was 20-25% 2010 with treatment improved to 45%  last seen in our group 2011 (pt in prison from 2015 to 2020) .  He is being seen 06/04/2021 for the evaluation of CHF at the request of Dr. Candiss Norse.  History of Present Illness:   Mr. Paul [name] with prior hx in 2010 of NICM due to HTN and improvement of EF from 20-25% to 45% with medication. Last seen by [facility] in 2011.  ECHO in 2013 with EF 45-50% mild LVH.   Pt presented late in night 1.27/23 with SOB for 1 week, progressing in severity and increased lower ext edema. Though he feels his abd did become larger.  No chest pain.  No recent colds or fevers.   He also hs hx of CKD3a with base cr of 1.5   He did admit to stopping home meds in last 3-4 months.     His Cr level was 4.07 BNP 1100, K+ 4.6  Hs troponin 128 and 106  Labs today  BNP 683,   Na 136, K+ 4.2 Cr 4.97 BUN 69 ca+ 7.7 GFR 13  WBC 10.6 down from 11.8 Hgb 9.8 plts 259   EKG:  The EKG was personally reviewed and demonstrates:  ST at 104 no acute ST changes from 2014.  Telemetry:  Telemetry was personally reviewed and demonstrates:  ST to SR with PAT rate 150 to 140 and then sinus brady with 2:1 block, at 10:26 AM yesterday  2V CXR  Mild left basilar opacity, favoring atelectasis, less likely pneumonia.  Renal u/s no hydronephrosis There is increased cortical echogenicity in both kidneys suggesting medical renal disease. 1.6 cm cyst is seen in the left kidney.  Echo 06/02/21 EF 30-35%, global hypokinesis, LV mildly dilated,  G2DD RV in normal. LA mild to mod dilated.  Mild MR, no AS, trivial  pericardial effusion.   Rec'd lasix 3 doses of IV lasix.  Neg 4660  ml and wt down from 83.9 Kg to 80.6 kg.  Is on imdur, coreg  BP 133/92 to 136/112 P 75 T 98.1   Past Medical History:  Diagnosis Date   Bipolar disorder ([place])    Central retinal artery occlusion    with macular infarction of the right eye. status post posterior vitrectomy and photocoagulation with insertion of a shunt in 2006.   CHF (congestive heart failure) (HCC)    chronic mixed syst/diast. 32/3557 echo: Systolic function was mildly reduced. The estimated ejection fraction was in 45%, global HK, Grade I Diast dysfxn.   Heart failure    Obesity    Osteoarthritis    s/p right hip replacement in 2001   Schizophrenia ([place])    Severe uncontrolled hypertension     Past Surgical History:  Procedure Laterality Date   JOINT REPLACEMENT     right hip replacement   right eye surgery     for glaucoma   TOTAL HIP ARTHROPLASTY Bilateral 1997, 2001     Home Medications:  Prior to Admission medications   Medication Sig Start Date End  Date Taking? Authorizing Provider  fenofibrate 54 MG tablet Take 54 mg by mouth daily. Patient not taking: Reported on 06/02/2021    [provider]  lisinopril (ZESTRIL) 40 MG tablet Take 1 tablet (40 mg total) by mouth daily. Patient not taking: Reported on 06/02/2021 11/03/19   Dewayne Hatch, MD  metoprolol tartrate (LOPRESSOR) 100 MG tablet Take 1 tablet (100 mg total) by mouth 2 (two) times daily. Patient not taking: Reported on 06/02/2021 11/03/19   Dewayne Hatch, MD  WAS not taking prior to admit   Inpatient Medications: Scheduled Meds:  aspirin  81 mg Oral Daily   carvedilol  6.25 mg Oral BID WC   heparin injection (subcutaneous)  5,000 Units Subcutaneous Q8H   hydrALAZINE  50 mg Oral Q8H   isosorbide mononitrate  60 mg Oral Daily   Continuous Infusions:  PRN Meds: acetaminophen **OR** acetaminophen, albuterol, hydrALAZINE  Allergies:    Allergies   Allergen Reactions   Penicillins Other (See Comments)    childhood    Social History:   Social History   Socioeconomic History   Marital status: Legally Separated    Spouse name: Not on file   Number of children: Not on file   Years of education: Not on file   Highest education level: Not on file  Occupational History   Occupation: Furniture conservator/restorer  Tobacco Use   Smoking status: Former   Smokeless tobacco: Never   Tobacco comments:    Quit smoking 20-30 years ago, smoked for 1 year.  Substance and Sexual Activity   Alcohol use: No   Drug use: No   Sexual activity: Yes    Birth control/protection: None  Other Topics Concern   Not on file  Social History Narrative   Not on file   Social Determinants of Health   Financial Resource Strain: Not on file  Food Insecurity: Not on file  Transportation Needs: Not on file  Physical Activity: Not on file  Stress: Not on file  Social Connections: Not on file  Intimate Partner Violence: Not on file    Family History:    Family History  Problem Relation Age of Onset   Hypertension Father    Stroke Father      ROS:  Please see the history of present illness.  General:no colds or fevers, no weight changes Skin:no rashes or ulcers HEENT:no blurred vision, no congestion CV:see HPI PUL:see HPI GI:no diarrhea constipation or melena, no indigestion GU:no hematuria, no dysuria MS:no joint pain, no claudication Neuro:no syncope, no lightheadedness Endo:no diabetes, no thyroid disease Psych:  Bipolar disorder/schizophrenia- he believes he was born to change world order and has a Wellsite geologist site.   All other ROS reviewed and negative.     Physical Exam/Data:   Vitals:   06/04/21 0732 06/04/21 0806 06/04/21 1053 06/04/21 1103  BP: (!) 152/102  (!) 133/92 (!) 136/112  Pulse:  89    Resp: 18   16  Temp: 97.8 F (36.6 C)   98.1 F (36.7 C)  TempSrc: Oral   Oral  SpO2: 97%   97%  Weight:      Height:        Intake/Output Summary  (Last 24 hours) at 06/04/2021 1137 Last data filed at 06/04/2021 1103 Gross per 24 hour  Intake 600 ml  Output 2000 ml  Net -1400 ml   Last 3 Weights 06/04/2021 06/03/2021 06/02/2021  Weight (lbs) 177 lb 11.1 oz 179 lb 14.3 oz 181 lb  Weight (kg) 80.6  kg 81.6 kg 82.1 kg     Body mass index is 31.48 kg/m.  General:  Well nourished, well developed, in no acute distress, sitting of side of bed HEENT: Blind in Rt eye so it stays closed Neck: no JVD, sitting up Vascular: No carotid bruits; Distal pulses 2+ bilaterally Cardiac:  normal S1, S2; RRR; no murmur gallup rub or click Lungs:  clear to auscultation bilaterally, no wheezing, rhonchi or rales  Abd: soft, nontender, no hepatomegaly  Ext: no edema Musculoskeletal:  No deformities, BUE and BLE strength normal and equal Skin: warm and dry  Neuro:  alert and oriented X 3, does not open Rt eye due to blindness  intact, no focal abnormalities noted Psych:  Normal affect    Relevant CV Studies: Echo 06/02/21 IMPRESSIONS     1. Left ventricular ejection fraction, by estimation, is 30 to 35%. The  left ventricle has moderately decreased function. The left ventricle  demonstrates global hypokinesis. The left ventricular internal cavity size  was mildly dilated. Left ventricular  diastolic parameters are consistent with Grade II diastolic dysfunction  (pseudonormalization). Elevated left atrial pressure. There is severe  hypokinesis of the left ventricular, basal-mid inferior wall, inferoseptal  wall and inferolateral wall.   2. Right ventricular systolic function is normal. The right ventricular  size is normal.   3. Left atrial size was mild to moderately dilated.   4. The mitral valve is normal in structure. Mild mitral valve  regurgitation.   5. The aortic valve is tricuspid. Aortic valve regurgitation is not  visualized. Aortic valve sclerosis/calcification is present, without any  evidence of aortic stenosis.   6. The inferior  vena cava is normal in size with greater than 50%  respiratory variability, suggesting right atrial pressure of 3 mmHg.   Comparison(s): Prior images unable to be directly viewed, comparison made  by report only.   FINDINGS   Left Ventricle: Left ventricular ejection fraction, by estimation, is 30  to 35%. The left ventricle has moderately decreased function. The left  ventricle demonstrates global hypokinesis. Severe hypokinesis of the left  ventricular, basal-mid inferior  wall, inferoseptal wall and inferolateral wall. The left ventricular  internal cavity size was mildly dilated. There is no left ventricular  hypertrophy. Left ventricular diastolic parameters are consistent with  Grade II diastolic dysfunction  (pseudonormalization). Elevated left atrial pressure.   Right Ventricle: The right ventricular size is normal. No increase in  right ventricular wall thickness. Right ventricular systolic function is  normal.   Left Atrium: Left atrial size was mild to moderately dilated.   Right Atrium: Right atrial size was normal in size.   Pericardium: Trivial pericardial effusion is present.   Mitral Valve: The mitral valve is normal in structure. Mild mitral valve  regurgitation.   Tricuspid Valve: The tricuspid valve is normal in structure. Tricuspid  valve regurgitation is not demonstrated.   Aortic Valve: The aortic valve is tricuspid. Aortic valve regurgitation is  not visualized. Aortic valve sclerosis/calcification is present, without  any evidence of aortic stenosis.   Pulmonic Valve: The pulmonic valve was grossly normal. Pulmonic valve  regurgitation is not visualized.   Aorta: The aortic root and ascending aorta are structurally normal, with  no evidence of dilitation.   Laboratory Data:  High Sensitivity Troponin:   Recent Labs  Lab 06/01/21 2340 06/02/21 0305  TROPONINIHS 128* 106*     Chemistry Recent Labs  Lab 06/02/21 0305 06/03/21 0057  06/04/21 0040  NA 139  137 136  K 4.7 4.4 4.2  CL 110 105 104  CO2 17* 20* 20*  GLUCOSE 140* 117* 97  BUN 49* 60* 69*  CREATININE 4.08* 4.38* 4.97*  CALCIUM 8.1* 7.7* 7.4*  MG 2.0 2.0 2.0  GFRNONAA 16* 15* 13*  ANIONGAP 12 12 12     Recent Labs  Lab 06/02/21 0305 06/03/21 0057 06/04/21 0040  PROT 7.1 6.4* 7.2  ALBUMIN 3.5 3.1* 3.5  AST 84* 45* 38  ALT 108* 81* 78*  ALKPHOS 85 86 88  BILITOT 1.0 0.6 0.7   Lipids No results for input(s): CHOL, TRIG, HDL, LABVLDL, LDLCALC, CHOLHDL in the last 168 hours.  Hematology Recent Labs  Lab 06/02/21 0305 06/03/21 0057 06/04/21 0040  WBC 8.3 11.8* 10.6*  RBC 3.46* 3.11* 3.33*  HGB 10.1* 9.1* 9.8*  HCT 30.3* 27.1* 28.8*  MCV 87.6 87.1 86.5  MCH 29.2 29.3 29.4  MCHC 33.3 33.6 34.0  RDW 14.2 14.4 14.4  PLT 284 287 259   Thyroid No results for input(s): TSH, FREET4 in the last 168 hours.  BNP Recent Labs  Lab 06/01/21 2340 06/03/21 0057 06/04/21 0206  BNP 1,118.3* 973.0* 683.2*    DDimer No results for input(s): DDIMER in the last 168 hours.   Radiology/Studies:  DG Chest 2 View  Result Date: 06/02/2021 CLINICAL DATA:  Shortness of breath EXAM: CHEST - 2 VIEW COMPARISON:  09/10/2011 FINDINGS: Mild left basilar opacity, favoring atelectasis, less likely pneumonia. No pleural effusion or pneumothorax. The heart is top-normal in size. Degenerative changes of the visualized thoracolumbar spine. IMPRESSION: Mild left basilar opacity, favoring atelectasis, less likely pneumonia. Electronically Signed   By: Julian Hy M.D.   On: 06/02/2021 00:43   US RENAL  Addendum Date: 06/02/2021   ADDENDUM REPORT: 06/02/2021 12:43 ADDENDUM: Incidental note is made of small right pleural effusion. Electronically Signed   By: Elmer Picker M.D.   On: 06/02/2021 12:43   Result Date: 06/02/2021 CLINICAL DATA:  Renal dysfunction EXAM: RENAL / URINARY TRACT ULTRASOUND COMPLETE COMPARISON:  None. FINDINGS: Right Kidney: Renal  measurements: 8.6 x 5.4 x 5.1 cm = volume: 123.6 mL. There is no hydronephrosis. There is increased cortical echogenicity. Left Kidney: Renal measurements: 9.2 x 5.6 x 5.1 cm = volume: 136.9 mL. There is no hydronephrosis. There is increased cortical echogenicity. There is 1.6 cm anechoic structure in the lower pole of left kidney. Bladder: Bilateral ureteral jets are observed. Other: None. IMPRESSION: There is no hydronephrosis. There is increased cortical echogenicity in both kidneys suggesting medical renal disease. 1.6 cm cyst is seen in the left kidney. Electronically Signed: By: Elmer Picker M.D. On: 06/02/2021 12:35   DG Chest Port 1 View  Result Date: 06/03/2021 CLINICAL DATA:  Short of breath. EXAM: PORTABLE CHEST 1 VIEW COMPARISON:  06/02/2021 and older studies. FINDINGS: Cardiac silhouette mildly enlarged.  No mediastinal or hilar masses. No convincing change in the opacity noted at the retrocardiac left lung base on the previous day's study. Lungs show prominent vascular interstitial markings that are stable, but are otherwise clear. No pleural effusion or pneumothorax. IMPRESSION: 1. No change from the previous day's exam. Left base opacity is similar to the previous exam, likely atelectasis. No definite acute cardiopulmonary disease. Electronically Signed   By: Lajean Manes M.D.   On: 06/03/2021 09:01   ECHOCARDIOGRAM COMPLETE  Result Date: 06/02/2021    ECHOCARDIOGRAM REPORT   Patient Name:   Paul [name] Date of Exam: 06/02/2021 Medical Rec #:  [id number]         Height:       63.0 in Accession #:    [id number]        Weight:       185.0 lb Date of Birth:  [birth year]         BSA:          1.871 m Patient Age:    31 years          BP:           131/69 mmHg Patient Gender: M                 HR:           74 bpm. Exam Location:  Inpatient Procedure: 2D Echo Indications:    acute systolic chf  History:        Patient has prior history of Echocardiogram examinations, most                  recent 09/11/2011. Chronic kidney disease,                 Signs/Symptoms:Shortness of Breath; Risk Factors:Hypertension.  Sonographer:    Johny Chess RDCS Referring Phys: [id number]  1. Left ventricular ejection fraction, by estimation, is 30 to 35%. The left ventricle has moderately decreased function. The left ventricle demonstrates global hypokinesis. The left ventricular internal cavity size was mildly dilated. Left ventricular diastolic parameters are consistent with Grade II diastolic dysfunction (pseudonormalization). Elevated left atrial pressure. There is severe hypokinesis of the left ventricular, basal-mid inferior wall, inferoseptal wall and inferolateral wall.  2. Right ventricular systolic function is normal. The right ventricular size is normal.  3. Left atrial size was mild to moderately dilated.  4. The mitral valve is normal in structure. Mild mitral valve regurgitation.  5. The aortic valve is tricuspid. Aortic valve regurgitation is not visualized. Aortic valve sclerosis/calcification is present, without any evidence of aortic stenosis.  6. The inferior vena cava is normal in size with greater than 50% respiratory variability, suggesting right atrial pressure of 3 mmHg. Comparison(s): Prior images unable to be directly viewed, comparison made by report only. FINDINGS  Left Ventricle: Left ventricular ejection fraction, by estimation, is 30 to 35%. The left ventricle has moderately decreased function. The left ventricle demonstrates global hypokinesis. Severe hypokinesis of the left ventricular, basal-mid inferior wall, inferoseptal wall and inferolateral wall. The left ventricular internal cavity size was mildly dilated. There is no left ventricular hypertrophy. Left ventricular diastolic parameters are consistent with Grade II diastolic dysfunction (pseudonormalization). Elevated left atrial pressure. Right Ventricle: The right ventricular size is normal. No  increase in right ventricular wall thickness. Right ventricular systolic function is normal. Left Atrium: Left atrial size was mild to moderately dilated. Right Atrium: Right atrial size was normal in size. Pericardium: Trivial pericardial effusion is present. Mitral Valve: The mitral valve is normal in structure. Mild mitral valve regurgitation. Tricuspid Valve: The tricuspid valve is normal in structure. Tricuspid valve regurgitation is not demonstrated. Aortic Valve: The aortic valve is tricuspid. Aortic valve regurgitation is not visualized. Aortic valve sclerosis/calcification is present, without any evidence of aortic stenosis. Pulmonic Valve: The pulmonic valve was grossly normal. Pulmonic valve regurgitation is not visualized. Aorta: The aortic root and ascending aorta are structurally normal, with no evidence of dilitation. Venous: The inferior vena cava is normal in size with greater than 50% respiratory variability, suggesting right atrial pressure of 3 mmHg.  IAS/Shunts: No atrial level shunt detected by color flow Doppler.  LEFT VENTRICLE PLAX 2D LVIDd:         5.70 cm      Diastology LVIDs:         4.90 cm      LV e' medial:    4.46 cm/s LV PW:         1.10 cm      LV E/e' medial:  23.5 LV IVS:        1.20 cm      LV e' lateral:   8.05 cm/s LVOT diam:     2.20 cm      LV E/e' lateral: 13.0 LV SV:         53 LV SV Index:   28 LVOT Area:     3.80 cm  LV Volumes (MOD) LV vol d, MOD A4C: 140.0 ml LV vol s, MOD A4C: 105.0 ml LV SV MOD A4C:     140.0 ml RIGHT VENTRICLE             IVC RV S prime:     12.70 cm/s  IVC diam: 2.00 cm TAPSE (M-mode): 2.1 cm LEFT ATRIUM             Index        RIGHT ATRIUM           Index LA diam:        4.10 cm 2.19 cm/m   RA Area:     14.60 cm LA Vol (A2C):   70.2 ml 37.53 ml/m  RA Volume:   35.30 ml  18.87 ml/m LA Vol (A4C):   77.0 ml 41.16 ml/m LA Biplane Vol: 76.8 ml 41.06 ml/m  AORTIC VALVE LVOT Vmax:   71.60 cm/s LVOT Vmean:  47.100 cm/s LVOT VTI:    0.139 m  AORTA  Ao Root diam: 3.40 cm Ao Asc diam:  3.40 cm MITRAL VALVE MV Area (PHT): 5.84 cm       SHUNTS MV Decel Time: 130 msec       Systemic VTI:  0.14 m MR Peak grad:    127.7 mmHg   Systemic Diam: 2.20 cm MR Mean grad:    86.0 mmHg MR Vmax:         565.00 cm/s MR Vmean:        437.0 cm/s MR PISA:         1.01 cm MR PISA Eff ROA: 7 mm MR PISA Radius:  0.40 cm MV E velocity: 105.00 cm/s MV A velocity: 50.70 cm/s MV E/A ratio:  2.07 Mihai Croitoru MD Electronically signed by Sanda Klein MD Signature Date/Time: 06/02/2021/1:14:21 PM    Final      Assessment and Plan:   Acute combined systolic and diastolic CHF/respiratory failure, has diuresed now neg 4.4 L since admit, lasix on hold secondary to AKI.   Hx of NICM due to HTN originally as low at 20-25% per notes.  Improved to 45% but now having stopped meds EF has decreased.  He is on BB, hydralazine and imdur - but ARB/ACE difficult due to AKI.  No angina and no change in EKG.  + global hypokinesis on ECHO, pt never had nuc study due to financial issues. No chest pain. No lt heart cath, with AKI, may need Rt heart cath - but will monitor for now.  Dr. Debara Pickett to see.    AKI,  in prison he was told he "kidneys were failing" now Cr  4.97 with GFR 15 to 13.  Nephrology following.  HTN on coreg, imdur and hydralazine.  Social situation di difficult, he does have a friend that may help him, but will need close follow up.  He is on medicaid to assist with meds.  He does have scales at home.    Risk Assessment/Risk Scores:        New York Heart Association (NYHA) Functional Class NYHA Class IV        For questions or updates, please contact [facility] Please consult [email or web address] for contact info under    Signed, Cecilie Kicks, NP  06/04/2021 11:37 AM

## 2021-06-04 NOTE — Plan of Care (Signed)

## 2021-06-04 NOTE — Progress Notes (Signed)
Mobility Specialist Progress Note    06/04/21 1431  Mobility  Activity Ambulated independently in hallway  Level of Assistance Standby assist, set-up cues, supervision of patient - no hands on  Assistive Device None  Distance Ambulated (ft) 810 ft  Activity Response Tolerated fair  $Mobility charge 1 Mobility   Pre-Mobility:120/80 BP During Mobility: 108 HR Post-Mobility: 81 HR  Pt received in bed and agreeable. C/o SOB during walk and could hear wheezing, RN notified. Left sitting EOB with call bell in reach.   [facility] Mobility Specialist  M.S. 2C and 6E: [phone] M.S. 4E: [phone]

## 2021-06-04 NOTE — Progress Notes (Signed)
Nephrology Follow-Up Consult note   Assessment/Recommendations: Paul [name] is a/an 56 y.o. male with a past medical history significant for CHF, osteoarthritis, schizophrenia, hypertension, admitted for heart failure exacerbation.     Progressive CKD stage IV/V: possibly some AKI component but based on his history this most likely represents progressive CKD.  Baseline around 4-5.  Patient appears more volume optimized today.  We will hold diuretics for now -Continue to monitor daily Cr, Dose meds for GFR -Monitor Daily I/Os, Daily weight  -Maintain MAP>65 for optimal renal perfusion.  -Avoid nephrotoxic medications including NSAIDs and Vanc/Zosyn combo -Renal ultrasound with signs of chronic kidney disease -Currently no indication for HD   CHF exacerbation: Has significantly improved with diuresis.  Hold Lasix for now and consider reinstituting a standing oral dose in the next 24 to 48 hours.  Urology consulting given ejection fraction is decreased from 30 to 35%.  Hypocalcemia: Corrects slightly when accounting for albumin.  Possibly some secondary hyperparathyroidism.  Monitor for now and check PTH.  Hypertension: Blood pressure is better controlled on current medications.  Anemia of CKD: Likely multifactorial.  Hemoglobin stable at 9.8. Check iron studies   Recommendations conveyed to primary service.    [facility] 06/04/2021 9:52 AM  ___________________________________________________________  CC: Shortness of breath  Interval History/Subjective: Patient states he feels fairly well today.  Does not notice any problems with his kidneys.  Denies nausea or vomiting.   Medications:  Current Facility-Administered Medications  Medication Dose Route Frequency Provider Last Rate Last Admin   acetaminophen (TYLENOL) tablet 650 mg  650 mg Oral Q6H PRN Howerter, Justin B, DO       Or   acetaminophen (TYLENOL) suppository 650 mg  650 mg Rectal  Q6H PRN Howerter, Justin B, DO       albuterol (PROVENTIL) (2.5 MG/3ML) 0.083% nebulizer solution 2.5 mg  2.5 mg Nebulization Q4H PRN Thurnell Lose, MD       aspirin chewable tablet 81 mg  81 mg Oral Daily Thurnell Lose, MD       carvedilol (COREG) tablet 6.25 mg  6.25 mg Oral BID WC Thurnell Lose, MD   6.25 mg at 06/04/21 0806   heparin injection 5,000 Units  5,000 Units Subcutaneous Q8H Thurnell Lose, MD   5,000 Units at 06/04/21 0615   hydrALAZINE (APRESOLINE) injection 10 mg  10 mg Intravenous Q4H PRN Howerter, Justin B, DO       hydrALAZINE (APRESOLINE) tablet 50 mg  50 mg Oral Q8H Thurnell Lose, MD       isosorbide mononitrate (IMDUR) 24 hr tablet 60 mg  60 mg Oral Daily Thurnell Lose, MD   60 mg at 06/03/21 3474      Review of Systems: 10 systems reviewed and negative except per interval history/subjective  Physical Exam: Vitals:   06/04/21 0732 06/04/21 0806  BP: (!) 152/102   Pulse:  89  Resp: 18   Temp: 97.8 F (36.6 C)   SpO2: 97%    Total I/O In: 360 [P.O.:360] Out: -   Intake/Output Summary (Last 24 hours) at 06/04/2021 2595 Last data filed at 06/04/2021 0900 Gross per 24 hour  Intake 600 ml  Output 2275 ml  Net -1675 ml   Constitutional: well-appearing, no acute distress ENMT: ears and nose without scars or lesions, MMM CV: normal rate, no edema Respiratory: Bilateral chest rise, normal work of breathing Gastrointestinal: soft, non-tender, no palpable masses or hernias Skin:  no visible lesions or rashes Psych: alert, judgement/insight appropriate, appropriate mood and affect   Test Results I personally reviewed new and old clinical labs and radiology tests Lab Results  Component Value Date   NA 136 06/04/2021   K 4.2 06/04/2021   CL 104 06/04/2021   CO2 20 (L) 06/04/2021   BUN 69 (H) 06/04/2021   CREATININE 4.97 (H) 06/04/2021   CALCIUM 7.4 (L) 06/04/2021   ALBUMIN 3.5 06/04/2021   PHOS 4.5 06/02/2021

## 2021-06-04 NOTE — Progress Notes (Addendum)
PROGRESS NOTE                                                                                                                                                                                                             Patient Demographics:    Paul [name], is a 56 y.o. male, DOB - Mar 03, 1966, TIW:[id number]  Outpatient Primary MD for the patient is No primary care provider on file.    LOS - 2  Admit date - 06/01/2021    Chief Complaint  Patient presents with   Shortness of Breath       Brief Narrative (HPI from H&P)   56 y.o. male with medical history significant for chronic systolic/diastolic heart failure, hypertension, stage IIIa chronic kidney disease, who is admitted to [facility] on 06/01/2021 with SOB due CHF + AKI.   Subjective:    Paul [name] today has, No headache, No chest pain, No abdominal pain - No Nausea, No new weakness tingling or numbness, improved SOB.   Assessment  & Plan :    No notes have been filed under this hospital service. Service: Hospitalist    Acute Hypoxic Resp. Failure due to Acute on chronic combined systolic and diastolic heart failure.  EF last known in 2013 was 45% now 35%. - he has been given IV Lasix with good effect, he has diuresed well and shortness of breath is improved, continue cautious diuresis, echocardiogram noted with drop in EF and global hypokinesis likely due to uncontrolled underlying hypertension, troponin level mildly elevated due to CHF causing demand mismatch, troponin trend is flat and in non-ACS pattern.    Have added Coreg, Hydralazine and Imdur to his regimen.  No ACE/ARB due to AKI, CHF symptoms much improved.  Will have cardiology take a look at him while he is in the hospital as well.  Will add aspirin, check A1c and lipid panel to check secondary risk factors.   2.  Renal failure.  Last creatinine over 5 years ago is 1.5, his renal failure  could have progressed to CKD stage V however AKI superimposed on CKD 3B cannot be ruled out.  Will obtain UA along with renal ultrasound, but diuresis to nephrology.   3.  Hypertension.  Placed on Coreg , Imdur added Hydralazine for better control.  Monitor and adjust.  Condition - Extremely Guarded  Family Communication  :  none present  Code Status :  Full  Consults  : Nephrology, cardiology  PUD Prophylaxis :    Procedures  :     Renal US - There is no hydronephrosis. There is increased cortical echogenicity in both kidneys suggesting medical renal disease. 1.6 cm cyst is seen in the left kidney  TTE -  1. Left ventricular ejection fraction, by estimation, is 30 to 35%. The left ventricle has moderately decreased function. The left ventricle demonstrates global hypokinesis. The left ventricular internal cavity size was mildly dilated. Left ventricular diastolic parameters are consistent with Grade II diastolic dysfunction (pseudonormalization). Elevated left atrial pressure. There is severe hypokinesis of the left ventricular, basal-mid inferior wall, inferoseptal wall and inferolateral wall.  2. Right ventricular systolic function is normal. The right ventricular size is normal.  3. Left atrial size was mild to moderately dilated.  4. The mitral valve is normal in structure. Mild mitral valve regurgitation.  5. The aortic valve is tricuspid. Aortic valve regurgitation is not visualized. Aortic valve sclerosis/calcification is present, without any evidence of aortic stenosis.  6. The inferior vena cava is normal in size with greater than 50% respiratory variability, suggesting right atrial pressure of 3 mmHg.      Disposition Plan  :    Status is: Inpatient  Remains inpatient appropriate because: CHF  DVT Prophylaxis  :  Heparin added  heparin injection 5,000 Units Start: 06/02/21 1400 SCDs Start: 06/02/21 0225   Lab Results  Component Value Date   PLT 259 06/04/2021     Diet :  Diet Order             Diet Heart Room service appropriate? Yes; Fluid consistency: Thin  Diet effective now                    Inpatient Medications  Scheduled Meds:  carvedilol  6.25 mg Oral BID WC   heparin injection (subcutaneous)  5,000 Units Subcutaneous Q8H   hydrALAZINE  50 mg Oral Q8H   isosorbide mononitrate  60 mg Oral Daily   Continuous Infusions: PRN Meds:.acetaminophen **OR** acetaminophen, albuterol, hydrALAZINE  Antibiotics  :    Anti-infectives (From admission, onward)    None        Time Spent in minutes  30   Lala Lund M.D on 06/04/2021 at 8:35 AM  To page go to [email or web address]   [facility] -  Office  [phone]  See all Orders from today for further details    Objective:   Vitals:   06/04/21 0508 06/04/21 0515 06/04/21 0732 06/04/21 0806  BP: (!) 132/106 (!) 144/99 (!) 152/102   Pulse: 78   89  Resp: 18 17 18    Temp: 98.1 F (36.7 C)  97.8 F (36.6 C)   TempSrc: Oral  Oral   SpO2: 98%  97%   Weight: 80.6 kg     Height:        Wt Readings from Last 3 Encounters:  06/04/21 80.6 kg  11/03/19 84.1 kg  09/12/11 90.9 kg     Intake/Output Summary (Last 24 hours) at 06/04/2021 0835 Last data filed at 06/04/2021 0617 Gross per 24 hour  Intake 240 ml  Output 2275 ml  Net -2035 ml     Physical Exam  Awake Alert, No new F.N deficits, Normal affect [place].AT,PERRAL Supple Neck, No JVD,   Symmetrical Chest wall movement, Good air movement bilaterally, CTAB RRR,No  Gallops, Rubs or new Murmurs,  +ve B.Sounds, Abd Soft, No tenderness,   No Cyanosis, Clubbing or edema       Data Review:    CBC Recent Labs  Lab 06/01/21 2340 06/02/21 0305 06/03/21 0057 06/04/21 0040  WBC 8.5 8.3 11.8* 10.6*  HGB 10.1* 10.1* 9.1* 9.8*  HCT 29.8* 30.3* 27.1* 28.8*  PLT 291 284 287 259  MCV 87.6 87.6 87.1 86.5  MCH 29.7 29.2 29.3 29.4  MCHC 33.9 33.3 33.6 34.0  RDW 14.2 14.2 14.4 14.4  LYMPHSABS 0.6* 0.3* 0.9  2.2  MONOABS 0.2 0.1 0.7 0.7  EOSABS 0.1 0.0 0.0 0.3  BASOSABS 0.1 0.0 0.0 0.1    Electrolytes Recent Labs  Lab 06/01/21 2340 06/02/21 0305 06/03/21 0057 06/04/21 0040 06/04/21 0206  NA 139 139 137 136  --   K 4.6 4.7 4.4 4.2  --   CL 112* 110 105 104  --   CO2 17* 17* 20* 20*  --   GLUCOSE 117* 140* 117* 97  --   BUN 49* 49* 60* 69*  --   CREATININE 4.07* 4.08* 4.38* 4.97*  --   CALCIUM 7.8* 8.1* 7.7* 7.4*  --   AST 107* 84* 45* 38  --   ALT 110* 108* 81* 78*  --   ALKPHOS 93 85 86 88  --   BILITOT 1.1 1.0 0.6 0.7  --   ALBUMIN 3.5 3.5 3.1* 3.5  --   MG  --  2.0 2.0 2.0  --   PROCALCITON  --  <0.10  --   --   --   BNP 1,118.3*  --  973.0*  --  683.2*    ------------------------------------------------------------------------------------------------------------------ No results for input(s): CHOL, HDL, LDLCALC, TRIG, CHOLHDL, LDLDIRECT in the last 72 hours.  No results found for: HGBA1C  No results for input(s): TSH, T4TOTAL, T3FREE, THYROIDAB in the last 72 hours.  Invalid input(s): FREET3 ------------------------------------------------------------------------------------------------------------------ ID Labs Recent Labs  Lab 06/01/21 2340 06/02/21 0305 06/03/21 0057 06/04/21 0040  WBC 8.5 8.3 11.8* 10.6*  PLT 291 284 287 259  PROCALCITON  --  <0.10  --   --   CREATININE 4.07* 4.08* 4.38* 4.97*   Cardiac Enzymes No results for input(s): CKMB, TROPONINI, MYOGLOBIN in the last 168 hours.  Invalid input(s): CK    Radiology Reports DG Chest 2 View  Result Date: 06/02/2021 CLINICAL DATA:  Shortness of breath EXAM: CHEST - 2 VIEW COMPARISON:  09/10/2011 FINDINGS: Mild left basilar opacity, favoring atelectasis, less likely pneumonia. No pleural effusion or pneumothorax. The heart is top-normal in size. Degenerative changes of the visualized thoracolumbar spine. IMPRESSION: Mild left basilar opacity, favoring atelectasis, less likely pneumonia. Electronically  Signed   By: Julian Hy M.D.   On: 06/02/2021 00:43   US RENAL  Addendum Date: 06/02/2021   ADDENDUM REPORT: 06/02/2021 12:43 ADDENDUM: Incidental note is made of small right pleural effusion. Electronically Signed   By: Elmer Picker M.D.   On: 06/02/2021 12:43   Result Date: 06/02/2021 CLINICAL DATA:  Renal dysfunction EXAM: RENAL / URINARY TRACT ULTRASOUND COMPLETE COMPARISON:  None. FINDINGS: Right Kidney: Renal measurements: 8.6 x 5.4 x 5.1 cm = volume: 123.6 mL. There is no hydronephrosis. There is increased cortical echogenicity. Left Kidney: Renal measurements: 9.2 x 5.6 x 5.1 cm = volume: 136.9 mL. There is no hydronephrosis. There is increased cortical echogenicity. There is 1.6 cm anechoic structure in the lower pole of left kidney. Bladder: Bilateral ureteral jets are observed.  Other: None. IMPRESSION: There is no hydronephrosis. There is increased cortical echogenicity in both kidneys suggesting medical renal disease. 1.6 cm cyst is seen in the left kidney. Electronically Signed: By: Elmer Picker M.D. On: 06/02/2021 12:35   DG Chest Port 1 View  Result Date: 06/03/2021 CLINICAL DATA:  Short of breath. EXAM: PORTABLE CHEST 1 VIEW COMPARISON:  06/02/2021 and older studies. FINDINGS: Cardiac silhouette mildly enlarged.  No mediastinal or hilar masses. No convincing change in the opacity noted at the retrocardiac left lung base on the previous day's study. Lungs show prominent vascular interstitial markings that are stable, but are otherwise clear. No pleural effusion or pneumothorax. IMPRESSION: 1. No change from the previous day's exam. Left base opacity is similar to the previous exam, likely atelectasis. No definite acute cardiopulmonary disease. Electronically Signed   By: Lajean Manes M.D.   On: 06/03/2021 09:01   ECHOCARDIOGRAM COMPLETE  Result Date: 06/02/2021    ECHOCARDIOGRAM REPORT   Patient Name:   JORDYNN PERRIER Pesnell Date of Exam: 06/02/2021 Medical Rec #:   [id number]         Height:       63.0 in Accession #:    [id number]        Weight:       185.0 lb Date of Birth:  07/28/1965         BSA:          1.871 m Patient Age:    1 years          BP:           131/69 mmHg Patient Gender: M                 HR:           74 bpm. Exam Location:  Inpatient Procedure: 2D Echo Indications:    acute systolic chf  History:        Patient has prior history of Echocardiogram examinations, most                 recent 09/11/2011. Chronic kidney disease,                 Signs/Symptoms:Shortness of Breath; Risk Factors:Hypertension.  Sonographer:    Johny Chess RDCS Referring Phys: [id number] [place]  1. Left ventricular ejection fraction, by estimation, is 30 to 35%. The left ventricle has moderately decreased function. The left ventricle demonstrates global hypokinesis. The left ventricular internal cavity size was mildly dilated. Left ventricular diastolic parameters are consistent with Grade II diastolic dysfunction (pseudonormalization). Elevated left atrial pressure. There is severe hypokinesis of the left ventricular, basal-mid inferior wall, inferoseptal wall and inferolateral wall.  2. Right ventricular systolic function is normal. The right ventricular size is normal.  3. Left atrial size was mild to moderately dilated.  4. The mitral valve is normal in structure. Mild mitral valve regurgitation.  5. The aortic valve is tricuspid. Aortic valve regurgitation is not visualized. Aortic valve sclerosis/calcification is present, without any evidence of aortic stenosis.  6. The inferior vena cava is normal in size with greater than 50% respiratory variability, suggesting right atrial pressure of 3 mmHg. Comparison(s): Prior images unable to be directly viewed, comparison made by report only. FINDINGS  Left Ventricle: Left ventricular ejection fraction, by estimation, is 30 to 35%. The left ventricle has moderately decreased function. The left ventricle  demonstrates global hypokinesis. Severe hypokinesis of the left ventricular, basal-mid inferior wall, inferoseptal wall and  inferolateral wall. The left ventricular internal cavity size was mildly dilated. There is no left ventricular hypertrophy. Left ventricular diastolic parameters are consistent with Grade II diastolic dysfunction (pseudonormalization). Elevated left atrial pressure. Right Ventricle: The right ventricular size is normal. No increase in right ventricular wall thickness. Right ventricular systolic function is normal. Left Atrium: Left atrial size was mild to moderately dilated. Right Atrium: Right atrial size was normal in size. Pericardium: Trivial pericardial effusion is present. Mitral Valve: The mitral valve is normal in structure. Mild mitral valve regurgitation. Tricuspid Valve: The tricuspid valve is normal in structure. Tricuspid valve regurgitation is not demonstrated. Aortic Valve: The aortic valve is tricuspid. Aortic valve regurgitation is not visualized. Aortic valve sclerosis/calcification is present, without any evidence of aortic stenosis. Pulmonic Valve: The pulmonic valve was grossly normal. Pulmonic valve regurgitation is not visualized. Aorta: The aortic root and ascending aorta are structurally normal, with no evidence of dilitation. Venous: The inferior vena cava is normal in size with greater than 50% respiratory variability, suggesting right atrial pressure of 3 mmHg. IAS/Shunts: No atrial level shunt detected by color flow Doppler.  LEFT VENTRICLE PLAX 2D LVIDd:         5.70 cm      Diastology LVIDs:         4.90 cm      LV e' medial:    4.46 cm/s LV PW:         1.10 cm      LV E/e' medial:  23.5 LV IVS:        1.20 cm      LV e' lateral:   8.05 cm/s LVOT diam:     2.20 cm      LV E/e' lateral: 13.0 LV SV:         53 LV SV Index:   28 LVOT Area:     3.80 cm  LV Volumes (MOD) LV vol d, MOD A4C: 140.0 ml LV vol s, MOD A4C: 105.0 ml LV SV MOD A4C:     140.0 ml RIGHT  VENTRICLE             IVC RV S prime:     12.70 cm/s  IVC diam: 2.00 cm TAPSE (M-mode): 2.1 cm LEFT ATRIUM             Index        RIGHT ATRIUM           Index LA diam:        4.10 cm 2.19 cm/m   RA Area:     14.60 cm LA Vol (A2C):   70.2 ml 37.53 ml/m  RA Volume:   35.30 ml  18.87 ml/m LA Vol (A4C):   77.0 ml 41.16 ml/m LA Biplane Vol: 76.8 ml 41.06 ml/m  AORTIC VALVE LVOT Vmax:   71.60 cm/s LVOT Vmean:  47.100 cm/s LVOT VTI:    0.139 m  AORTA Ao Root diam: 3.40 cm Ao Asc diam:  3.40 cm MITRAL VALVE MV Area (PHT): 5.84 cm       SHUNTS MV Decel Time: 130 msec       Systemic VTI:  0.14 m MR Peak grad:    127.7 mmHg   Systemic Diam: 2.20 cm MR Mean grad:    86.0 mmHg MR Vmax:         565.00 cm/s MR Vmean:        437.0 cm/s MR PISA:         1.01  cm MR PISA Eff ROA: 7 mm MR PISA Radius:  0.40 cm MV E velocity: 105.00 cm/s MV A velocity: 50.70 cm/s MV E/A ratio:  2.07 Mihai Croitoru MD Electronically signed by Sanda Klein MD Signature Date/Time: 06/02/2021/1:14:21 PM    Final

## 2021-06-05 ENCOUNTER — Other Ambulatory Visit (HOSPITAL_COMMUNITY): Payer: Self-pay

## 2021-06-05 DIAGNOSIS — I5043 Acute on chronic combined systolic (congestive) and diastolic (congestive) heart failure: Secondary | ICD-10-CM | POA: Diagnosis not present

## 2021-06-05 DIAGNOSIS — N170 Acute kidney failure with tubular necrosis: Secondary | ICD-10-CM | POA: Diagnosis not present

## 2021-06-05 DIAGNOSIS — I441 Atrioventricular block, second degree: Secondary | ICD-10-CM | POA: Diagnosis not present

## 2021-06-05 DIAGNOSIS — N1831 Chronic kidney disease, stage 3a: Secondary | ICD-10-CM | POA: Diagnosis not present

## 2021-06-05 LAB — CBC WITH DIFFERENTIAL/PLATELET
Abs Immature Granulocytes: 0.08 10*3/uL — ABNORMAL HIGH (ref 0.00–0.07)
Basophils Absolute: 0.1 10*3/uL (ref 0.0–0.1)
Basophils Relative: 1 %
Eosinophils Absolute: 0.5 10*3/uL (ref 0.0–0.5)
Eosinophils Relative: 5 %
HCT: 29.5 % — ABNORMAL LOW (ref 39.0–52.0)
Hemoglobin: 10.1 g/dL — ABNORMAL LOW (ref 13.0–17.0)
Immature Granulocytes: 1 %
Lymphocytes Relative: 18 %
Lymphs Abs: 1.7 10*3/uL (ref 0.7–4.0)
MCH: 29.4 pg (ref 26.0–34.0)
MCHC: 34.2 g/dL (ref 30.0–36.0)
MCV: 86 fL (ref 80.0–100.0)
Monocytes Absolute: 0.9 10*3/uL (ref 0.1–1.0)
Monocytes Relative: 9 %
Neutro Abs: 6.3 10*3/uL (ref 1.7–7.7)
Neutrophils Relative %: 66 %
Platelets: 307 10*3/uL (ref 150–400)
RBC: 3.43 MIL/uL — ABNORMAL LOW (ref 4.22–5.81)
RDW: 14.3 % (ref 11.5–15.5)
WBC: 9.6 10*3/uL (ref 4.0–10.5)
nRBC: 0 % (ref 0.0–0.2)

## 2021-06-05 LAB — COMPREHENSIVE METABOLIC PANEL
ALT: 55 U/L — ABNORMAL HIGH (ref 0–44)
AST: 17 U/L (ref 15–41)
Albumin: 3.3 g/dL — ABNORMAL LOW (ref 3.5–5.0)
Alkaline Phosphatase: 85 U/L (ref 38–126)
Anion gap: 9 (ref 5–15)
BUN: 68 mg/dL — ABNORMAL HIGH (ref 6–20)
CO2: 21 mmol/L — ABNORMAL LOW (ref 22–32)
Calcium: 6.7 mg/dL — ABNORMAL LOW (ref 8.9–10.3)
Chloride: 107 mmol/L (ref 98–111)
Creatinine, Ser: 4.59 mg/dL — ABNORMAL HIGH (ref 0.61–1.24)
GFR, Estimated: 14 mL/min — ABNORMAL LOW (ref >60)
Glucose, Bld: 105 mg/dL — ABNORMAL HIGH (ref 70–99)
Potassium: 4 mmol/L (ref 3.5–5.1)
Sodium: 137 mmol/L (ref 135–145)
Total Bilirubin: 0.4 mg/dL (ref 0.3–1.2)
Total Protein: 6.8 g/dL (ref 6.5–8.1)

## 2021-06-05 LAB — MAGNESIUM: Magnesium: 2 mg/dL (ref 1.7–2.4)

## 2021-06-05 LAB — LIPID PANEL
Cholesterol: 163 mg/dL (ref 0–200)
HDL: 37 mg/dL — ABNORMAL LOW (ref >40)
LDL Cholesterol: 80 mg/dL (ref 0–99)
Total CHOL/HDL Ratio: 4.4 RATIO
Triglycerides: 230 mg/dL — ABNORMAL HIGH (ref <150)
VLDL: 46 mg/dL — ABNORMAL HIGH (ref 0–40)

## 2021-06-05 LAB — PARATHYROID HORMONE, INTACT (NO CA): PTH: 202 pg/mL — ABNORMAL HIGH (ref 15–65)

## 2021-06-05 LAB — BRAIN NATRIURETIC PEPTIDE: B Natriuretic Peptide: 595.4 pg/mL — ABNORMAL HIGH (ref 0.0–100.0)

## 2021-06-05 MED ORDER — ISOSORBIDE MONONITRATE ER 60 MG PO TB24
60.0000 mg | ORAL_TABLET | Freq: Every day | ORAL | 0 refills | Status: DC
  Filled 2021-06-05: qty 30, 30d supply, fill #0

## 2021-06-05 MED ORDER — HYDRALAZINE HCL 50 MG PO TABS
50.0000 mg | ORAL_TABLET | Freq: Three times a day (TID) | ORAL | 0 refills | Status: DC
  Filled 2021-06-05: qty 90, 30d supply, fill #0

## 2021-06-05 MED ORDER — TORSEMIDE 20 MG PO TABS
20.0000 mg | ORAL_TABLET | Freq: Every day | ORAL | Status: DC
  Administered 2021-06-05: 20 mg via ORAL
  Filled 2021-06-05: qty 1

## 2021-06-05 MED ORDER — ROSUVASTATIN CALCIUM 5 MG PO TABS
5.0000 mg | ORAL_TABLET | Freq: Every day | ORAL | 0 refills | Status: DC
  Filled 2021-06-05: qty 30, 30d supply, fill #0

## 2021-06-05 MED ORDER — ASPIRIN 81 MG PO CHEW
81.0000 mg | CHEWABLE_TABLET | Freq: Every day | ORAL | 0 refills | Status: DC
  Filled 2021-06-05: qty 30, 30d supply, fill #0

## 2021-06-05 MED ORDER — CARVEDILOL 6.25 MG PO TABS
6.2500 mg | ORAL_TABLET | Freq: Two times a day (BID) | ORAL | 0 refills | Status: DC
  Filled 2021-06-05: qty 60, 30d supply, fill #0

## 2021-06-05 MED ORDER — TORSEMIDE 20 MG PO TABS
20.0000 mg | ORAL_TABLET | Freq: Every day | ORAL | 0 refills | Status: DC
  Filled 2021-06-05: qty 30, 30d supply, fill #0

## 2021-06-05 MED ORDER — FENOFIBRATE 54 MG PO TABS
54.0000 mg | ORAL_TABLET | Freq: Every day | ORAL | 0 refills | Status: DC
  Filled 2021-06-05: qty 30, 30d supply, fill #0

## 2021-06-05 MED ORDER — FERROUS SULFATE 325 (65 FE) MG PO TABS
325.0000 mg | ORAL_TABLET | Freq: Every day | ORAL | Status: DC
  Administered 2021-06-05: 325 mg via ORAL
  Filled 2021-06-05: qty 1

## 2021-06-05 NOTE — Progress Notes (Signed)
PROGRESS NOTE                                                                                                                                                                                                             Patient Demographics:    Paul [name], is a 56 y.o. male, DOB - Jul 04, 1965, TMH:[id number]  Outpatient Primary MD for the patient is No primary care provider on file.    LOS - 3  Admit date - 06/01/2021    Chief Complaint  Patient presents with   Shortness of Breath       Brief Narrative (HPI from H&P)   56 y.o. male with medical history significant for chronic systolic/diastolic heart failure, hypertension, stage IIIa chronic kidney disease, who is admitted to [facility] on 06/01/2021 with SOB due CHF + AKI.   Subjective:    Paul [name] today has, No headache, No chest pain, No abdominal pain - No Nausea, No new weakness tingling or numbness, improved SOB.   Assessment  & Plan :    No notes have been filed under this hospital service. Service: Hospitalist    Acute Hypoxic Resp. Failure due to Acute on chronic combined systolic and diastolic heart failure.  EF last known in 2013 was 45% now 35%. - he has been given IV Lasix with good effect, he has diuresed well and shortness of breath is improved, continue cautious diuresis, echocardiogram noted with drop in EF and global hypokinesis likely due to uncontrolled underlying hypertension, troponin level mildly elevated due to CHF causing demand mismatch, troponin trend is flat and in non-ACS pattern.    Have added Coreg, Hydralazine and Imdur to his regimen.  No ACE/ARB due to AKI, CHF symptoms much improved.  Appreciate cardiology input.  Have added aspirin, stable A1c LDL is above 70 will add lowest dose Crestor and monitor.   2.  Renal failure.  Last creatinine over 5 years ago is 1.5, his renal failure could have progressed to CKD stage V  however AKI superimposed on CKD 3B cannot be ruled out.  Will obtain UA along with renal ultrasound, but diuresis to nephrology.  3.  Hypertension.  Placed on Coreg , Imdur added Hydralazine for better control.  Monitor and adjust.  4.  Some delusions which he expressed to the nursing staff few days ago  but then retracted, thoughts about hurting himself which he expressed to the nursing staff few days ago but then retracted again,  does have some bizarre ideation from time to time, will request psych to evaluate.  Currently denies any suicidal or homicidal ideation.      Condition - Extremely Guarded  Family Communication  :  none present  Code Status :  Full  Consults  : Nephrology, cardiology, Psych  PUD Prophylaxis :    Procedures  :     Renal US - There is no hydronephrosis. There is increased cortical echogenicity in both kidneys suggesting medical renal disease. 1.6 cm cyst is seen in the left kidney  TTE -  1. Left ventricular ejection fraction, by estimation, is 30 to 35%. The left ventricle has moderately decreased function. The left ventricle demonstrates global hypokinesis. The left ventricular internal cavity size was mildly dilated. Left ventricular diastolic parameters are consistent with Grade II diastolic dysfunction (pseudonormalization). Elevated left atrial pressure. There is severe hypokinesis of the left ventricular, basal-mid inferior wall, inferoseptal wall and inferolateral wall.  2. Right ventricular systolic function is normal. The right ventricular size is normal.  3. Left atrial size was mild to moderately dilated.  4. The mitral valve is normal in structure. Mild mitral valve regurgitation.  5. The aortic valve is tricuspid. Aortic valve regurgitation is not visualized. Aortic valve sclerosis/calcification is present, without any evidence of aortic stenosis.  6. The inferior vena cava is normal in size with greater than 50% respiratory variability, suggesting right  atrial pressure of 3 mmHg.      Disposition Plan  :    Status is: Inpatient  Remains inpatient appropriate because: CHF  DVT Prophylaxis  :  Heparin added  heparin injection 5,000 Units Start: 06/02/21 1400 SCDs Start: 06/02/21 0225   Lab Results  Component Value Date   PLT 307 06/05/2021    Diet :  Diet Order             Diet Heart Room service appropriate? Yes; Fluid consistency: Thin  Diet effective now                    Inpatient Medications  Scheduled Meds:  aspirin  81 mg Oral Daily   carvedilol  6.25 mg Oral BID WC   ferrous sulfate  325 mg Oral Q breakfast   heparin injection (subcutaneous)  5,000 Units Subcutaneous Q8H   hydrALAZINE  50 mg Oral Q8H   isosorbide mononitrate  60 mg Oral Daily   torsemide  20 mg Oral Daily   Continuous Infusions: PRN Meds:.acetaminophen **OR** acetaminophen, albuterol, hydrALAZINE  Antibiotics  :    Anti-infectives (From admission, onward)    None        Time Spent in minutes  30   Lala Lund M.D on 06/05/2021 at 8:50 AM  To page go to [email or web address]   [facility] -  Office  [phone]  See all Orders from today for further details    Objective:   Vitals:   06/04/21 2200 06/04/21 2300 06/05/21 0500 06/05/21 0735  BP: (!) 145/88 133/84 (!) 130/95 124/75  Pulse: 79 76 76   Resp: 18 18 16 17   Temp: 98.8 F (37.1 C) 98.8 F (37.1 C) 98.6 F (37 C) 98.6 F (37 C)  TempSrc: Oral Oral Oral Oral  SpO2: 97% 99% 98% 98%  Weight:   79.7 kg   Height:        Wt Readings from  Last 3 Encounters:  06/05/21 79.7 kg  11/03/19 84.1 kg  09/12/11 90.9 kg     Intake/Output Summary (Last 24 hours) at 06/05/2021 0850 Last data filed at 06/04/2021 2200 Gross per 24 hour  Intake 840 ml  Output 450 ml  Net 390 ml     Physical Exam  Awake Alert, No new F.N deficits, anxious affect [place].AT,PERRAL Supple Neck, No JVD,   Symmetrical Chest wall movement, Good air movement bilaterally,  CTAB RRR,No Gallops, Rubs or new Murmurs,  +ve B.Sounds, Abd Soft, No tenderness,   No Cyanosis, Clubbing or edema        Data Review:    CBC Recent Labs  Lab 06/01/21 2340 06/02/21 0305 06/03/21 0057 06/04/21 0040 06/05/21 0057  WBC 8.5 8.3 11.8* 10.6* 9.6  HGB 10.1* 10.1* 9.1* 9.8* 10.1*  HCT 29.8* 30.3* 27.1* 28.8* 29.5*  PLT 291 284 287 259 307  MCV 87.6 87.6 87.1 86.5 86.0  MCH 29.7 29.2 29.3 29.4 29.4  MCHC 33.9 33.3 33.6 34.0 34.2  RDW 14.2 14.2 14.4 14.4 14.3  LYMPHSABS 0.6* 0.3* 0.9 2.2 1.7  MONOABS 0.2 0.1 0.7 0.7 0.9  EOSABS 0.1 0.0 0.0 0.3 0.5  BASOSABS 0.1 0.0 0.0 0.1 0.1    Electrolytes Recent Labs  Lab 06/01/21 2340 06/02/21 0305 06/03/21 0057 06/04/21 0040 06/04/21 0206 06/04/21 1450 06/05/21 0057  NA 139 139 137 136  --   --  137  K 4.6 4.7 4.4 4.2  --   --  4.0  CL 112* 110 105 104  --   --  107  CO2 17* 17* 20* 20*  --   --  21*  GLUCOSE 117* 140* 117* 97  --   --  105*  BUN 49* 49* 60* 69*  --   --  68*  CREATININE 4.07* 4.08* 4.38* 4.97*  --   --  4.59*  CALCIUM 7.8* 8.1* 7.7* 7.4*  --   --  6.7*  AST 107* 84* 45* 38  --   --  17  ALT 110* 108* 81* 78*  --   --  55*  ALKPHOS 93 85 86 88  --   --  85  BILITOT 1.1 1.0 0.6 0.7  --   --  0.4  ALBUMIN 3.5 3.5 3.1* 3.5  --   --  3.3*  MG  --  2.0 2.0 2.0  --   --  2.0  PROCALCITON  --  <0.10  --   --   --   --   --   HGBA1C  --   --   --   --   --  5.5  --   BNP 1,118.3*  --  973.0*  --  683.2*  --  595.4*    ------------------------------------------------------------------------------------------------------------------ Recent Labs    06/05/21 0057  CHOL 163  HDL 37*  LDLCALC 80  TRIG 230*  CHOLHDL 4.4    Lab Results  Component Value Date   HGBA1C 5.5 06/04/2021    No results for input(s): TSH, T4TOTAL, T3FREE, THYROIDAB in the last 72 hours.  Invalid input(s):  FREET3 ------------------------------------------------------------------------------------------------------------------ ID Labs Recent Labs  Lab 06/01/21 2340 06/02/21 0305 06/03/21 0057 06/04/21 0040 06/05/21 0057  WBC 8.5 8.3 11.8* 10.6* 9.6  PLT 291 284 287 259 307  PROCALCITON  --  <0.10  --   --   --   CREATININE 4.07* 4.08* 4.38* 4.97* 4.59*   Cardiac Enzymes No results for input(s): CKMB, TROPONINI, MYOGLOBIN  in the last 168 hours.  Invalid input(s): CK    Radiology Reports DG Chest 2 View  Result Date: 06/02/2021 CLINICAL DATA:  Shortness of breath EXAM: CHEST - 2 VIEW COMPARISON:  09/10/2011 FINDINGS: Mild left basilar opacity, favoring atelectasis, less likely pneumonia. No pleural effusion or pneumothorax. The heart is top-normal in size. Degenerative changes of the visualized thoracolumbar spine. IMPRESSION: Mild left basilar opacity, favoring atelectasis, less likely pneumonia. Electronically Signed   By: Julian Hy M.D.   On: 06/02/2021 00:43   US RENAL  Addendum Date: 06/02/2021   ADDENDUM REPORT: 06/02/2021 12:43 ADDENDUM: Incidental note is made of small right pleural effusion. Electronically Signed   By: Elmer Picker M.D.   On: 06/02/2021 12:43   Result Date: 06/02/2021 CLINICAL DATA:  Renal dysfunction EXAM: RENAL / URINARY TRACT ULTRASOUND COMPLETE COMPARISON:  None. FINDINGS: Right Kidney: Renal measurements: 8.6 x 5.4 x 5.1 cm = volume: 123.6 mL. There is no hydronephrosis. There is increased cortical echogenicity. Left Kidney: Renal measurements: 9.2 x 5.6 x 5.1 cm = volume: 136.9 mL. There is no hydronephrosis. There is increased cortical echogenicity. There is 1.6 cm anechoic structure in the lower pole of left kidney. Bladder: Bilateral ureteral jets are observed. Other: None. IMPRESSION: There is no hydronephrosis. There is increased cortical echogenicity in both kidneys suggesting medical renal disease. 1.6 cm cyst is seen in the left kidney.  Electronically Signed: By: Elmer Picker M.D. On: 06/02/2021 12:35   DG Chest Port 1 View  Result Date: 06/03/2021 CLINICAL DATA:  Short of breath. EXAM: PORTABLE CHEST 1 VIEW COMPARISON:  06/02/2021 and older studies. FINDINGS: Cardiac silhouette mildly enlarged.  No mediastinal or hilar masses. No convincing change in the opacity noted at the retrocardiac left lung base on the previous day's study. Lungs show prominent vascular interstitial markings that are stable, but are otherwise clear. No pleural effusion or pneumothorax. IMPRESSION: 1. No change from the previous day's exam. Left base opacity is similar to the previous exam, likely atelectasis. No definite acute cardiopulmonary disease. Electronically Signed   By: Lajean Manes M.D.   On: 06/03/2021 09:01   ECHOCARDIOGRAM COMPLETE  Result Date: 06/02/2021    ECHOCARDIOGRAM REPORT   Patient Name:   JOEANTHONY SEELING Cornette Date of Exam: 06/02/2021 Medical Rec #:  [id number]         Height:       63.0 in Accession #:    [id number]        Weight:       185.0 lb Date of Birth:  08-Nov-1965         BSA:          1.871 m Patient Age:    81 years          BP:           131/69 mmHg Patient Gender: M                 HR:           74 bpm. Exam Location:  Inpatient Procedure: 2D Echo Indications:    acute systolic chf  History:        Patient has prior history of Echocardiogram examinations, most                 recent 09/11/2011. Chronic kidney disease,                 Signs/Symptoms:Shortness of Breath; Risk Factors:Hypertension.  Sonographer:    Ander Purpura  [facility] RDCS Referring Phys: [id number] [place]  1. Left ventricular ejection fraction, by estimation, is 30 to 35%. The left ventricle has moderately decreased function. The left ventricle demonstrates global hypokinesis. The left ventricular internal cavity size was mildly dilated. Left ventricular diastolic parameters are consistent with Grade II diastolic dysfunction (pseudonormalization).  Elevated left atrial pressure. There is severe hypokinesis of the left ventricular, basal-mid inferior wall, inferoseptal wall and inferolateral wall.  2. Right ventricular systolic function is normal. The right ventricular size is normal.  3. Left atrial size was mild to moderately dilated.  4. The mitral valve is normal in structure. Mild mitral valve regurgitation.  5. The aortic valve is tricuspid. Aortic valve regurgitation is not visualized. Aortic valve sclerosis/calcification is present, without any evidence of aortic stenosis.  6. The inferior vena cava is normal in size with greater than 50% respiratory variability, suggesting right atrial pressure of 3 mmHg. Comparison(s): Prior images unable to be directly viewed, comparison made by report only. FINDINGS  Left Ventricle: Left ventricular ejection fraction, by estimation, is 30 to 35%. The left ventricle has moderately decreased function. The left ventricle demonstrates global hypokinesis. Severe hypokinesis of the left ventricular, basal-mid inferior wall, inferoseptal wall and inferolateral wall. The left ventricular internal cavity size was mildly dilated. There is no left ventricular hypertrophy. Left ventricular diastolic parameters are consistent with Grade II diastolic dysfunction (pseudonormalization). Elevated left atrial pressure. Right Ventricle: The right ventricular size is normal. No increase in right ventricular wall thickness. Right ventricular systolic function is normal. Left Atrium: Left atrial size was mild to moderately dilated. Right Atrium: Right atrial size was normal in size. Pericardium: Trivial pericardial effusion is present. Mitral Valve: The mitral valve is normal in structure. Mild mitral valve regurgitation. Tricuspid Valve: The tricuspid valve is normal in structure. Tricuspid valve regurgitation is not demonstrated. Aortic Valve: The aortic valve is tricuspid. Aortic valve regurgitation is not visualized. Aortic valve  sclerosis/calcification is present, without any evidence of aortic stenosis. Pulmonic Valve: The pulmonic valve was grossly normal. Pulmonic valve regurgitation is not visualized. Aorta: The aortic root and ascending aorta are structurally normal, with no evidence of dilitation. Venous: The inferior vena cava is normal in size with greater than 50% respiratory variability, suggesting right atrial pressure of 3 mmHg. IAS/Shunts: No atrial level shunt detected by color flow Doppler.  LEFT VENTRICLE PLAX 2D LVIDd:         5.70 cm      Diastology LVIDs:         4.90 cm      LV e' medial:    4.46 cm/s LV PW:         1.10 cm      LV E/e' medial:  23.5 LV IVS:        1.20 cm      LV e' lateral:   8.05 cm/s LVOT diam:     2.20 cm      LV E/e' lateral: 13.0 LV SV:         53 LV SV Index:   28 LVOT Area:     3.80 cm  LV Volumes (MOD) LV vol d, MOD A4C: 140.0 ml LV vol s, MOD A4C: 105.0 ml LV SV MOD A4C:     140.0 ml RIGHT VENTRICLE             IVC RV S prime:     12.70 cm/s  IVC diam: 2.00 cm TAPSE (M-mode): 2.1 cm LEFT  ATRIUM             Index        RIGHT ATRIUM           Index LA diam:        4.10 cm 2.19 cm/m   RA Area:     14.60 cm LA Vol (A2C):   70.2 ml 37.53 ml/m  RA Volume:   35.30 ml  18.87 ml/m LA Vol (A4C):   77.0 ml 41.16 ml/m LA Biplane Vol: 76.8 ml 41.06 ml/m  AORTIC VALVE LVOT Vmax:   71.60 cm/s LVOT Vmean:  47.100 cm/s LVOT VTI:    0.139 m  AORTA Ao Root diam: 3.40 cm Ao Asc diam:  3.40 cm MITRAL VALVE MV Area (PHT): 5.84 cm       SHUNTS MV Decel Time: 130 msec       Systemic VTI:  0.14 m MR Peak grad:    127.7 mmHg   Systemic Diam: 2.20 cm MR Mean grad:    86.0 mmHg MR Vmax:         565.00 cm/s MR Vmean:        437.0 cm/s MR PISA:         1.01 cm MR PISA Eff ROA: 7 mm MR PISA Radius:  0.40 cm MV E velocity: 105.00 cm/s MV A velocity: 50.70 cm/s MV E/A ratio:  2.07 Mihai Croitoru MD Electronically signed by Sanda Klein MD Signature Date/Time: 06/02/2021/1:14:21 PM    Final

## 2021-06-05 NOTE — Progress Notes (Signed)
DAILY PROGRESS NOTE   Patient Name: Paul [name] Date of Encounter: 06/05/2021 Cardiologist: None  Chief Complaint   No complaints  Patient Profile   Paul [name] is a 56 y.o. 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  EF was 20-25% 2010 with treatment improved to 45%  last seen in our group 2011 (pt in prison from 2015 to 2020) .  He is being seen 06/04/2021 for the evaluation of CHF at the request of Dr. Candiss Norse.  Subjective   Noted to have some Wenckebach with ventricular escape around 2000 last evening - HR down to 43, otherwise no issues. BP reasonably well controlled - on carvedilol 6.25 mg BID and imdur.  Creatinine improved today after holding lasix. BNP elevated - although literature suggests that serial BNP measurements should not be used to manage volume status as they can be innaccurate, especially in patients with AKI.  Objective   Vitals:   06/04/21 2200 06/04/21 2300 06/05/21 0500 06/05/21 0735  BP: (!) 145/88 133/84 (!) 130/95 124/75  Pulse: 79 76 76   Resp: 18 18 16 17   Temp: 98.8 F (37.1 C) 98.8 F (37.1 C) 98.6 F (37 C) 98.6 F (37 C)  TempSrc: Oral Oral Oral Oral  SpO2: 97% 99% 98% 98%  Weight:   79.7 kg   Height:        Intake/Output Summary (Last 24 hours) at 06/05/2021 2426 Last data filed at 06/04/2021 2200 Gross per 24 hour  Intake 840 ml  Output 450 ml  Net 390 ml   Filed Weights   06/03/21 0558 06/04/21 0508 06/05/21 0500  Weight: 81.6 kg 80.6 kg 79.7 kg    Physical Exam   General appearance: alert and no distress Lungs: clear to auscultation bilaterally Heart: regular rate and rhythm, S1, S2 normal, no murmur, click, rub or gallop Extremities: extremities normal, atraumatic, no cyanosis or edema Neurologic: Grossly normal  Inpatient Medications    Scheduled Meds:  aspirin  81 mg Oral Daily   carvedilol  6.25 mg Oral BID WC   ferrous sulfate  325 mg Oral Q  breakfast   heparin injection (subcutaneous)  5,000 Units Subcutaneous Q8H   hydrALAZINE  50 mg Oral Q8H   isosorbide mononitrate  60 mg Oral Daily   torsemide  20 mg Oral Daily    Continuous Infusions:   PRN Meds: acetaminophen **OR** acetaminophen, albuterol, hydrALAZINE   Labs   Results for orders placed or performed during the hospital encounter of 06/01/21 (from the past 48 hour(s))  Magnesium     Status: None   Collection Time: 06/04/21 12:40 AM  Result Value Ref Range   Magnesium 2.0 1.7 - 2.4 mg/dL    Comment: Performed at [facility] [address] [place], [place] [address]  Comprehensive metabolic panel     Status: Abnormal   Collection Time: 06/04/21 12:40 AM  Result Value Ref Range   Sodium 136 135 - 145 mmol/L   Potassium 4.2 3.5 - 5.1 mmol/L   Chloride 104 98 - 111 mmol/L   CO2 20 (L) 22 - 32 mmol/L   Glucose, Bld 97 70 - 99 mg/dL    Comment: Glucose reference range applies only to samples taken after fasting for at least 8 hours.   BUN 69 (H) 6 - 20 mg/dL   Creatinine, Ser 4.97 (H) 0.61 - 1.24 mg/dL   Calcium  7.4 (L) 8.9 - 10.3 mg/dL   Total Protein 7.2 6.5 - 8.1 g/dL   Albumin 3.5 3.5 - 5.0 g/dL   AST 38 15 - 41 U/L   ALT 78 (H) 0 - 44 U/L   Alkaline Phosphatase 88 38 - 126 U/L   Total Bilirubin 0.7 0.3 - 1.2 mg/dL   GFR, Estimated 13 (L) >60 mL/min    Comment: (NOTE) Calculated using the CKD-EPI Creatinine Equation (2021)    Anion gap 12 5 - 15    Comment: Performed at [facility] [address] [place], [place] [address]  CBC with Differential/Platelet     Status: Abnormal   Collection Time: 06/04/21 12:40 AM  Result Value Ref Range   WBC 10.6 (H) 4.0 - 10.5 K/uL   RBC 3.33 (L) 4.22 - 5.81 MIL/uL   Hemoglobin 9.8 (L) 13.0 - 17.0 g/dL   HCT 28.8 (L) 39.0 - 52.0 %   MCV 86.5 80.0 - 100.0 fL   MCH 29.4 26.0 - 34.0 pg   MCHC 34.0 30.0 - 36.0 g/dL   RDW 14.4 11.5 - 15.5 %   Platelets 259 150 - 400 K/uL   nRBC 0.0 0.0 - 0.2 %    Neutrophils Relative % 67 %   Neutro Abs 7.2 1.7 - 7.7 K/uL   Lymphocytes Relative 21 %   Lymphs Abs 2.2 0.7 - 4.0 K/uL   Monocytes Relative 7 %   Monocytes Absolute 0.7 0.1 - 1.0 K/uL   Eosinophils Relative 3 %   Eosinophils Absolute 0.3 0.0 - 0.5 K/uL   Basophils Relative 1 %   Basophils Absolute 0.1 0.0 - 0.1 K/uL   Immature Granulocytes 1 %   Abs Immature Granulocytes 0.07 0.00 - 0.07 K/uL    Comment: Performed at [facility] [address] [place], [place] [address]  Brain natriuretic peptide     Status: Abnormal   Collection Time: 06/04/21  2:06 AM  Result Value Ref Range   B Natriuretic Peptide 683.2 (H) 0.0 - 100.0 pg/mL    Comment: Performed at [facility] [address], [place], [place] [address]  Hemoglobin A1c     Status: None   Collection Time: 06/04/21  2:50 PM  Result Value Ref Range   Hgb A1c MFr Bld 5.5 4.8 - 5.6 %    Comment: (NOTE) Pre diabetes:          5.7%-6.4%  Diabetes:              >6.4%  Glycemic control for   <7.0% adults with diabetes    Mean Plasma Glucose 111.15 mg/dL    Comment: Performed at [facility] [address] [place], [place] [address]  Magnesium     Status: None   Collection Time: 06/05/21 12:57 AM  Result Value Ref Range   Magnesium 2.0 1.7 - 2.4 mg/dL    Comment: Performed at [facility] [address], [place], [place] [address]  Comprehensive metabolic panel     Status: Abnormal   Collection Time: 06/05/21 12:57 AM  Result Value Ref Range   Sodium 137 135 - 145 mmol/L   Potassium 4.0 3.5 - 5.1 mmol/L   Chloride 107 98 - 111 mmol/L   CO2 21 (L) 22 - 32 mmol/L   Glucose, Bld 105 (H) 70 - 99 mg/dL    Comment: Glucose reference range applies only to samples taken after fasting for at least 8  hours.   BUN 68 (H) 6 - 20 mg/dL   Creatinine, Ser 4.59 (H) 0.61 - 1.24 mg/dL   Calcium 6.7 (L) 8.9 - 10.3 mg/dL   Total Protein 6.8 6.5 - 8.1 g/dL   Albumin 3.3 (L) 3.5 - 5.0 g/dL   AST 17 15 - 41 U/L    ALT 55 (H) 0 - 44 U/L   Alkaline Phosphatase 85 38 - 126 U/L   Total Bilirubin 0.4 0.3 - 1.2 mg/dL   GFR, Estimated 14 (L) >60 mL/min    Comment: (NOTE) Calculated using the CKD-EPI Creatinine Equation (2021)    Anion gap 9 5 - 15    Comment: Performed at [facility], [place] [address], [place], [place] [address]  CBC with Differential/Platelet     Status: Abnormal   Collection Time: 06/05/21 12:57 AM  Result Value Ref Range   WBC 9.6 4.0 - 10.5 K/uL   RBC 3.43 (L) 4.22 - 5.81 MIL/uL   Hemoglobin 10.1 (L) 13.0 - 17.0 g/dL   HCT 29.5 (L) 39.0 - 52.0 %   MCV 86.0 80.0 - 100.0 fL   MCH 29.4 26.0 - 34.0 pg   MCHC 34.2 30.0 - 36.0 g/dL   RDW 14.3 11.5 - 15.5 %   Platelets 307 150 - 400 K/uL   nRBC 0.0 0.0 - 0.2 %   Neutrophils Relative % 66 %   Neutro Abs 6.3 1.7 - 7.7 K/uL   Lymphocytes Relative 18 %   Lymphs Abs 1.7 0.7 - 4.0 K/uL   Monocytes Relative 9 %   Monocytes Absolute 0.9 0.1 - 1.0 K/uL   Eosinophils Relative 5 %   Eosinophils Absolute 0.5 0.0 - 0.5 K/uL   Basophils Relative 1 %   Basophils Absolute 0.1 0.0 - 0.1 K/uL   Immature Granulocytes 1 %   Abs Immature Granulocytes 0.08 (H) 0.00 - 0.07 K/uL    Comment: Performed at [facility] [address] [place], [place] [address]  Brain natriuretic peptide     Status: Abnormal   Collection Time: 06/05/21 12:57 AM  Result Value Ref Range   B Natriuretic Peptide 595.4 (H) 0.0 - 100.0 pg/mL    Comment: Performed at [facility] [address], [place], [place] [address]  Lipid panel     Status: Abnormal   Collection Time: 06/05/21 12:57 AM  Result Value Ref Range   Cholesterol 163 0 - 200 mg/dL   Triglycerides 230 (H) <150 mg/dL   HDL 37 (L) >40 mg/dL   Total CHOL/HDL Ratio 4.4 RATIO   VLDL 46 (H) 0 - 40 mg/dL   LDL Cholesterol 80 0 - 99 mg/dL    Comment:        Total Cholesterol/HDL:CHD Risk Coronary Heart Disease Risk Table                     Men   Women  1/2 Average Risk   3.4   3.3   Average Risk       5.0   4.4  2 X Average Risk   9.6   7.1  3 X Average Risk  23.4   11.0        Use the calculated Patient Ratio above and the CHD Risk Table to determine the patient's CHD Risk.        ATP III CLASSIFICATION (LDL):  <100     mg/dL   Optimal  100-129  mg/dL  Near or Above                    Optimal  130-159  mg/dL   Borderline  160-189  mg/dL   High  >190     mg/dL   Very High Performed at [facility] [address] [place], [place] [address]     ECG   N/A  Telemetry   Sinus rhythm with Mobitz 2, Type 1 (Wenckebach) and ventricular escape - Personally Reviewed  Radiology    DG Chest Port 1 View  Result Date: 06/03/2021 CLINICAL DATA:  Short of breath. EXAM: PORTABLE CHEST 1 VIEW COMPARISON:  06/02/2021 and older studies. FINDINGS: Cardiac silhouette mildly enlarged.  No mediastinal or hilar masses. No convincing change in the opacity noted at the retrocardiac left lung base on the previous day's study. Lungs show prominent vascular interstitial markings that are stable, but are otherwise clear. No pleural effusion or pneumothorax. IMPRESSION: 1. No change from the previous day's exam. Left base opacity is similar to the previous exam, likely atelectasis. No definite acute cardiopulmonary disease. Electronically Signed   By: Lajean Manes M.D.   On: 06/03/2021 09:01    Cardiac Studies   N/A  Assessment   Principal Problem:   Acute on chronic combined systolic and diastolic CHF (congestive heart failure) (HCC) Active Problems:   Essential hypertension   SOB (shortness of breath)   Acute renal failure superimposed on stage 3a chronic kidney disease (HCC)   Elevated troponin   Plan   Acute combined systolic and diastolic CHF/respiratory failure - has diuresed now neg 4.4 L since admit, lasix on hold secondary to AKI. Creatinine improved today - does not appear volume overloaded. BNP may be partially elevated d/t renal failure. Would not follow  it serially as a marker of volume status, rather rely more on exam findings. Hx of NICM due to HTN originally as low at 20-25% per notes.  Improved to 45% but now having stopped meds EF has decreased to 30-35%.  He is on BB, hydralazine and imdur - but ARB/ACE difficult due to AKI.  No angina and no change in EKG.  + global hypokinesis on ECHO, pt never had nuc study due to financial issues. No chest pain. Not a cath candidate given renal function - little utility in [place] testing, as if abnormal, could not pursue cath without likely progressing to dialysis.    AKI,  in prison he was told he "kidneys were failing" now Cr 4.97 with GFR 15 to 13.  Nephrology following. Creatinine improved holding lasix. HTN on coreg, imdur and hydralazine. Well-controlled. Schizophrenia - will need better control of this if medication compliance is to be expected out of the hospital. Would evaluate if he is amenable to psych consult. Wenkebach w/ventricular escape - Had episode of progressive PR prolongation and AV dissociation with ventricular escape around 2000 last night - no recurrence. Would monitor and continue current dose coreg for now.  Time Spent Directly with Patient:  I have spent a total of 25 minutes with the patient reviewing hospital notes, telemetry, EKGs, labs and examining the patient as well as establishing an assessment and plan that was discussed personally with the patient.  > 50% of time was spent in direct patient care.  Length of Stay:  LOS: 3 days   Pixie Casino, MD, [facility], [place] Director of the [facility] Lipid Disorders &  Cardi[facility]  [facility] Diplomate of the American Board of Clinical Lipidology Attending Cardiologist  Direct Dial: [phone]   Fax: [phone]  Website:  [email or web address] [email or web address] 06/05/2021, 8:28 AM

## 2021-06-05 NOTE — Progress Notes (Signed)
Nephrology Follow-Up Consult note   Assessment/Recommendations: Paul [name] is a/an 56 y.o. male with a past medical history significant for CHF, osteoarthritis, schizophrenia, hypertension, admitted for heart failure exacerbation.     Progressive CKD stage IV/V: possibly some AKI component but based on his history this most likely represents progressive CKD.  Baseline around 4-5.  Patient appears volume optimized -Creatinine slightly better today likely at new baseline -Start torsemide 20 mg daily for maintenance of volume status -Monitor Daily I/Os, Daily weight  -Maintain MAP>65 for optimal renal perfusion.  -Avoid nephrotoxic medications including NSAIDs and Vanc/Zosyn combo -Renal ultrasound with signs of chronic kidney disease -Currently no indication for HD; continues to state he has no interest in dialysis when the time comes -Given the patient stable creatinine and likely baseline we will sign off at this time.  We will arrange follow-up in clinic for 2 to 3 weeks  CHF exacerbation: Has significantly improved with diuresis.  Hold Lasix for now and consider reinstituting a standing oral dose in the next 24 to 48 hours.  Cardiology consulting given ejection fraction is decreased from 30 to 35%.  Hypocalcemia: Corrects to around 7.2 when accounting for albumin. Will follow up PTH outpatient.  Hypertension: Blood pressure is better controlled on current medications.  Anemia of CKD: Likely multifactorial.  Hemoglobin stable at 10.1. Iron labs okay. Start oral iron daily.   Recommendations conveyed to primary service.    [facility] 06/05/2021 9:07 AM  ___________________________________________________________  CC: Shortness of breath  Interval History/Subjective: Patient appears well today.  Some dyspnea on exertion.  Otherwise no significant issues.  States he would not want dialysis when the time comes.  We discussed that based on his  current clinical situation I think it is fairly likely he would require dialysis in the next 6 to 12 months.  We also discussed that if he were not to start dialysis kidney failure can often lead to death.  However, we respect his decision what ever it may be.   Medications:  Current Facility-Administered Medications  Medication Dose Route Frequency Provider Last Rate Last Admin   acetaminophen (TYLENOL) tablet 650 mg  650 mg Oral Q6H PRN Howerter, Justin B, DO       Or   acetaminophen (TYLENOL) suppository 650 mg  650 mg Rectal Q6H PRN Howerter, Justin B, DO       albuterol (PROVENTIL) (2.5 MG/3ML) 0.083% nebulizer solution 2.5 mg  2.5 mg Nebulization Q4H PRN Thurnell Lose, MD       aspirin chewable tablet 81 mg  81 mg Oral Daily Lala Lund K, MD   81 mg at 06/04/21 1054   carvedilol (COREG) tablet 6.25 mg  6.25 mg Oral BID WC Thurnell Lose, MD   6.25 mg at 06/04/21 1820   ferrous sulfate tablet 325 mg  325 mg Oral Q breakfast Reesa Chew, MD       heparin injection 5,000 Units  5,000 Units Subcutaneous Q8H Thurnell Lose, MD   5,000 Units at 06/05/21 0548   hydrALAZINE (APRESOLINE) injection 10 mg  10 mg Intravenous Q4H PRN Howerter, Justin B, DO       hydrALAZINE (APRESOLINE) tablet 50 mg  50 mg Oral Q8H Thurnell Lose, MD   50 mg at 06/05/21 0548   isosorbide mononitrate (IMDUR) 24 hr tablet 60 mg  60 mg Oral Daily Thurnell Lose, MD   60 mg at 06/04/21 1054   torsemide (DEMADEX)  tablet 20 mg  20 mg Oral Daily Reesa Chew, MD          Review of Systems: 10 systems reviewed and negative except per interval history/subjective  Physical Exam: Vitals:   06/05/21 0500 06/05/21 0735  BP: (!) 130/95 124/75  Pulse: 76   Resp: 16 17  Temp: 98.6 F (37 C) 98.6 F (37 C)  SpO2: 98% 98%   No intake/output data recorded.  Intake/Output Summary (Last 24 hours) at 06/05/2021 3475 Last data filed at 06/04/2021 2200 Gross per 24 hour  Intake 480 ml  Output  450 ml  Net 30 ml   Constitutional: well-appearing, no acute distress ENMT: ears and nose without scars or lesions, MMM CV: normal rate, no edema Respiratory: Bilateral chest rise, normal work of breathing Gastrointestinal: soft, non-tender, no palpable masses or hernias Skin: no visible lesions or rashes Psych: alert, judgement/insight appropriate, appropriate mood and affect   Test Results I personally reviewed new and old clinical labs and radiology tests Lab Results  Component Value Date   NA 137 06/05/2021   K 4.0 06/05/2021   CL 107 06/05/2021   CO2 21 (L) 06/05/2021   BUN 68 (H) 06/05/2021   CREATININE 4.59 (H) 06/05/2021   CALCIUM 6.7 (L) 06/05/2021   ALBUMIN 3.3 (L) 06/05/2021   PHOS 4.5 06/02/2021

## 2021-06-05 NOTE — Discharge Summary (Signed)
Paul [name] XBM:[id number] DOB: Dec 04, 1965 DOA: 06/01/2021  PCP: No primary care provider on file.  Admit date: 06/01/2021  Discharge date: 06/05/2021  Admitted From: Home   Disposition:  Home   Recommendations for Outpatient Follow-up:   Follow up with PCP in 1-2 weeks  PCP Please obtain BMP/CBC, 2 view CXR in 1week,  (see Discharge instructions)   PCP Please follow up on the following pending results: Monitor BMP closely   Home Health: None   Equipment/Devices: None  Consultations: Renal, cards, Psych Discharge Condition: Stable    CODE STATUS: Full    Diet Recommendation: Heart Healthy, 1.5 lit/day fluid restriction  Diet Order             Diet Heart Room service appropriate? Yes; Fluid consistency: Thin  Diet effective now                    Chief Complaint  Patient presents with   Shortness of Breath     Brief history of present illness from the day of admission and additional interim summary    56 y.o. male with medical history significant for chronic systolic/diastolic heart failure, hypertension, stage IIIa chronic kidney disease, who is admitted to [facility] on 06/01/2021 with SOB due CHF + AKI.                                                                 Hospital Course    Acute Hypoxic Resp. Failure due to Acute on chronic combined systolic and diastolic heart failure.  EF last known in 2013 was 45% now 35%. - he has been given IV Lasix with good effect, he has diuresed well and shortness of breath is improved, continue cautious diuresis, echocardiogram noted with drop in EF and global hypokinesis likely due to uncontrolled underlying hypertension, troponin level mildly elevated due to CHF causing demand mismatch, troponin trend is flat and in non-ACS pattern.     Have  added Coreg, Hydralazine and Imdur to his regimen.  No ACE/ARB due to AKI, CHF symptoms much improved. Appreciate cardiology input.  Have added aspirin, stable A1c LDL is above 70 will add lowest dose Crestor, he is now symptom free, cleared by Cards and renal for DC.  He will be discharged with close outpatient PCP, cardiology and renal follow-up.  He has been counseled on medication compliance.  He will be placed on combination of aspirin, Coreg, hydralazine, Imdur, Demadex and Crestor at the time of discharge.     2.  Renal failure.  Last creatinine over 5 years ago is 1.5, this time it looks like patient has developed CKD stage IV.  He was seen by nephrology team and will be followed by them post discharge as well.   3.  Hypertension.  Placed  on Coreg , Imdur added Hydralazine for better control.  Monitor and adjust.   4.  Some delusions which he expressed to the nursing staff few days ago but then retracted, thoughts about hurting himself which he expressed to the nursing staff few days ago but then retracted again,  does have some bizarre ideation from time to time, will request psych to evaluate.  Currently denies any suicidal or homicidal ideation.  Psych was called to see the patient however patient refused to talk with the psych team.   Discharge diagnosis     Principal Problem:   Acute on chronic combined systolic and diastolic CHF (congestive heart failure) ([place]) Active Problems:   Essential hypertension   SOB (shortness of breath)   Acute renal failure superimposed on stage 3a chronic kidney disease (HCC)   Elevated troponin    Discharge instructions    Discharge Instructions     Discharge instructions   Complete by: As directed    Follow with Primary MD in 7 days   Get CBC, CMP, 2 view Chest X ray -  checked next visit within 1 week by Primary MD    Activity: As tolerated with Full fall precautions use walker/cane & assistance as needed  Disposition Home     Diet: Heart Healthy, 1.5 lit/day fluid restriction  Special Instructions: If you have smoked or chewed Tobacco  in the last 2 yrs please stop smoking, stop any regular Alcohol  and or any Recreational drug use.  On your next visit with your primary care physician please Get Medicines reviewed and adjusted.  Please request your Prim.MD to go over all Hospital Tests and Procedure/Radiological results at the follow up, please get all Hospital records sent to your Prim MD by signing hospital release before you go home.  If you experience worsening of your admission symptoms, develop shortness of breath, life threatening emergency, suicidal or homicidal thoughts you must seek medical attention immediately by calling 911 or calling your MD immediately  if symptoms less severe.  You Must read complete instructions/literature along with all the possible adverse reactions/side effects for all the Medicines you take and that have been prescribed to you. Take any new Medicines after you have completely understood and accpet all the possible adverse reactions/side effects.   Increase activity slowly   Complete by: As directed        Discharge Medications   Allergies as of 06/05/2021       Reactions   Penicillins Other (See Comments)   childhood        Medication List     STOP taking these medications    lisinopril 40 MG tablet Commonly known as: ZESTRIL   metoprolol tartrate 100 MG tablet Commonly known as: LOPRESSOR       TAKE these medications    aspirin 81 MG chewable tablet Chew 1 tablet (81 mg total) by mouth daily. Start taking on: June 06, 2021   carvedilol 6.25 MG tablet Commonly known as: COREG Take 1 tablet (6.25 mg total) by mouth 2 (two) times daily with a meal.   fenofibrate 54 MG tablet Take 54 mg by mouth daily.   hydrALAZINE 50 MG tablet Commonly known as: APRESOLINE Take 1 tablet (50 mg total) by mouth every 8 (eight) hours.   isosorbide  mononitrate 60 MG 24 hr tablet Commonly known as: IMDUR Take 1 tablet (60 mg total) by mouth daily. Start taking on: June 06, 2021   rosuvastatin 5 MG tablet Commonly known  as: Crestor Take 1 tablet (5 mg total) by mouth daily.   torsemide 20 MG tablet Commonly known as: DEMADEX Take 1 tablet (20 mg total) by mouth daily. Start taking on: June 06, 2021         Follow-up Information     [place]. Schedule an appointment as soon as possible for a visit in 1 week(s).   Contact information: [place] [id number] [phone]        Pixie Casino, MD. Schedule an appointment as soon as possible for a visit in 1 week(s).   Specialty: Cardiology Contact information: [address] [place] [address] [phone]         Reesa Chew, MD. Schedule an appointment as soon as possible for a visit in 1 week(s).   Specialty: Internal Medicine Contact information: [place] [address] [phone]                 Major procedures and Radiology Reports - PLEASE review detailed and final reports thoroughly  -       DG Chest 2 View  Result Date: 06/02/2021 CLINICAL DATA:  Shortness of breath EXAM: CHEST - 2 VIEW COMPARISON:  09/10/2011 FINDINGS: Mild left basilar opacity, favoring atelectasis, less likely pneumonia. No pleural effusion or pneumothorax. The heart is top-normal in size. Degenerative changes of the visualized thoracolumbar spine. IMPRESSION: Mild left basilar opacity, favoring atelectasis, less likely pneumonia. Electronically Signed   By: Julian Hy M.D.   On: 06/02/2021 00:43   US RENAL  Addendum Date: 06/02/2021   ADDENDUM REPORT: 06/02/2021 12:43 ADDENDUM: Incidental note is made of small right pleural effusion. Electronically Signed   By: Elmer Picker M.D.   On: 06/02/2021 12:43   Result Date: 06/02/2021 CLINICAL DATA:  Renal  dysfunction EXAM: RENAL / URINARY TRACT ULTRASOUND COMPLETE COMPARISON:  None. FINDINGS: Right Kidney: Renal measurements: 8.6 x 5.4 x 5.1 cm = volume: 123.6 mL. There is no hydronephrosis. There is increased cortical echogenicity. Left Kidney: Renal measurements: 9.2 x 5.6 x 5.1 cm = volume: 136.9 mL. There is no hydronephrosis. There is increased cortical echogenicity. There is 1.6 cm anechoic structure in the lower pole of left kidney. Bladder: Bilateral ureteral jets are observed. Other: None. IMPRESSION: There is no hydronephrosis. There is increased cortical echogenicity in both kidneys suggesting medical renal disease. 1.6 cm cyst is seen in the left kidney. Electronically Signed: By: Elmer Picker M.D. On: 06/02/2021 12:35   DG Chest Port 1 View  Result Date: 06/03/2021 CLINICAL DATA:  Short of breath. EXAM: PORTABLE CHEST 1 VIEW COMPARISON:  06/02/2021 and older studies. FINDINGS: Cardiac silhouette mildly enlarged.  No mediastinal or hilar masses. No convincing change in the opacity noted at the retrocardiac left lung base on the previous day's study. Lungs show prominent vascular interstitial markings that are stable, but are otherwise clear. No pleural effusion or pneumothorax. IMPRESSION: 1. No change from the previous day's exam. Left base opacity is similar to the previous exam, likely atelectasis. No definite acute cardiopulmonary disease. Electronically Signed   By: Lajean Manes M.D.   On: 06/03/2021 09:01   ECHOCARDIOGRAM COMPLETE  Result Date: 06/02/2021    ECHOCARDIOGRAM REPORT   Patient Name:   GLYNN YEPES Klute Date of Exam: 06/02/2021 Medical Rec #:  [id number]         Height:       63.0 in Accession #:  [id number]        Weight:       185.0 lb Date of Birth:  05/25/1965         BSA:          1.871 m Patient Age:    25 years          BP:           131/69 mmHg Patient Gender: M                 HR:           74 bpm. Exam Location:  Inpatient Procedure: 2D Echo Indications:     acute systolic chf  History:        Patient has prior history of Echocardiogram examinations, most                 recent 09/11/2011. Chronic kidney disease,                 Signs/Symptoms:Shortness of Breath; Risk Factors:Hypertension.  Sonographer:    Johny Chess RDCS Referring Phys: [id number] [place]  1. Left ventricular ejection fraction, by estimation, is 30 to 35%. The left ventricle has moderately decreased function. The left ventricle demonstrates global hypokinesis. The left ventricular internal cavity size was mildly dilated. Left ventricular diastolic parameters are consistent with Grade II diastolic dysfunction (pseudonormalization). Elevated left atrial pressure. There is severe hypokinesis of the left ventricular, basal-mid inferior wall, inferoseptal wall and inferolateral wall.  2. Right ventricular systolic function is normal. The right ventricular size is normal.  3. Left atrial size was mild to moderately dilated.  4. The mitral valve is normal in structure. Mild mitral valve regurgitation.  5. The aortic valve is tricuspid. Aortic valve regurgitation is not visualized. Aortic valve sclerosis/calcification is present, without any evidence of aortic stenosis.  6. The inferior vena cava is normal in size with greater than 50% respiratory variability, suggesting right atrial pressure of 3 mmHg. Comparison(s): Prior images unable to be directly viewed, comparison made by report only. FINDINGS  Left Ventricle: Left ventricular ejection fraction, by estimation, is 30 to 35%. The left ventricle has moderately decreased function. The left ventricle demonstrates global hypokinesis. Severe hypokinesis of the left ventricular, basal-mid inferior wall, inferoseptal wall and inferolateral wall. The left ventricular internal cavity size was mildly dilated. There is no left ventricular hypertrophy. Left ventricular diastolic parameters are consistent with Grade II diastolic dysfunction  (pseudonormalization). Elevated left atrial pressure. Right Ventricle: The right ventricular size is normal. No increase in right ventricular wall thickness. Right ventricular systolic function is normal. Left Atrium: Left atrial size was mild to moderately dilated. Right Atrium: Right atrial size was normal in size. Pericardium: Trivial pericardial effusion is present. Mitral Valve: The mitral valve is normal in structure. Mild mitral valve regurgitation. Tricuspid Valve: The tricuspid valve is normal in structure. Tricuspid valve regurgitation is not demonstrated. Aortic Valve: The aortic valve is tricuspid. Aortic valve regurgitation is not visualized. Aortic valve sclerosis/calcification is present, without any evidence of aortic stenosis. Pulmonic Valve: The pulmonic valve was grossly normal. Pulmonic valve regurgitation is not visualized. Aorta: The aortic root and ascending aorta are structurally normal, with no evidence of dilitation. Venous: The inferior vena cava is normal in size with greater than 50% respiratory variability, suggesting right atrial pressure of 3 mmHg. IAS/Shunts: No atrial level shunt detected by color flow Doppler.  LEFT VENTRICLE PLAX 2D LVIDd:  5.70 cm      Diastology LVIDs:         4.90 cm      LV e' medial:    4.46 cm/s LV PW:         1.10 cm      LV E/e' medial:  23.5 LV IVS:        1.20 cm      LV e' lateral:   8.05 cm/s LVOT diam:     2.20 cm      LV E/e' lateral: 13.0 LV SV:         53 LV SV Index:   28 LVOT Area:     3.80 cm  LV Volumes (MOD) LV vol d, MOD A4C: 140.0 ml LV vol s, MOD A4C: 105.0 ml LV SV MOD A4C:     140.0 ml RIGHT VENTRICLE             IVC RV S prime:     12.70 cm/s  IVC diam: 2.00 cm TAPSE (M-mode): 2.1 cm LEFT ATRIUM             Index        RIGHT ATRIUM           Index LA diam:        4.10 cm 2.19 cm/m   RA Area:     14.60 cm LA Vol (A2C):   70.2 ml 37.53 ml/m  RA Volume:   35.30 ml  18.87 ml/m LA Vol (A4C):   77.0 ml 41.16 ml/m LA Biplane Vol:  76.8 ml 41.06 ml/m  AORTIC VALVE LVOT Vmax:   71.60 cm/s LVOT Vmean:  47.100 cm/s LVOT VTI:    0.139 m  AORTA Ao Root diam: 3.40 cm Ao Asc diam:  3.40 cm MITRAL VALVE MV Area (PHT): 5.84 cm       SHUNTS MV Decel Time: 130 msec       Systemic VTI:  0.14 m MR Peak grad:    127.7 mmHg   Systemic Diam: 2.20 cm MR Mean grad:    86.0 mmHg MR Vmax:         565.00 cm/s MR Vmean:        437.0 cm/s MR PISA:         1.01 cm MR PISA Eff ROA: 7 mm MR PISA Radius:  0.40 cm MV E velocity: 105.00 cm/s MV A velocity: 50.70 cm/s MV E/A ratio:  2.07 Mihai Croitoru MD Electronically signed by Sanda Klein MD Signature Date/Time: 06/02/2021/1:14:21 PM    Final      Today   Subjective    Paul [name] today has no headache,no chest abdominal pain,no new weakness tingling or numbness, feels much better wants to go home today.     Objective   Blood pressure (!) 130/93, pulse 76, temperature 98 F (36.7 C), temperature source Oral, resp. rate 18, height 5\' 3"  (1.6 m), weight 79.7 kg, SpO2 97 %.   Intake/Output Summary (Last 24 hours) at 06/05/2021 1311 Last data filed at 06/05/2021 1300 Gross per 24 hour  Intake 960 ml  Output --  Net 960 ml    Exam  Awake Alert, No new F.N deficits,    [place].AT,PERRAL Supple Neck,   Symmetrical Chest wall movement, Good air movement bilaterally, CTAB RRR,No Gallops,   +ve B.Sounds, Abd Soft, Non tender,  No Cyanosis, Clubbing or edema    Data Review   CBC w Diff:  Lab Results  Component Value Date  WBC 9.6 06/05/2021   HGB 10.1 (L) 06/05/2021   HCT 29.5 (L) 06/05/2021   PLT 307 06/05/2021   LYMPHOPCT 18 06/05/2021   MONOPCT 9 06/05/2021   EOSPCT 5 06/05/2021   BASOPCT 1 06/05/2021    CMP:  Lab Results  Component Value Date   NA 137 06/05/2021   K 4.0 06/05/2021   CL 107 06/05/2021   CO2 21 (L) 06/05/2021   BUN 68 (H) 06/05/2021   CREATININE 4.59 (H) 06/05/2021   PROT 6.8 06/05/2021   ALBUMIN 3.3 (L) 06/05/2021   BILITOT 0.4 06/05/2021    ALKPHOS 85 06/05/2021   AST 17 06/05/2021   ALT 55 (H) 06/05/2021  .   Total Time in preparing paper work, data evaluation and todays exam - 68 minutes  Lala Lund M.D on 06/05/2021 at 1:11 PM  [facility]

## 2021-06-05 NOTE — Consult Note (Signed)
Paul [name] psychiatry consult   Patient is a 56 year old male with a past  Psychiatry history of bipolar, schizophrenia and medical h/o chronic systolic/diastolic Hx, HTN, CKD 3 A.  Admitted medically for shortness of breath found to have acute on chronic systolic heart failure. Psych consulted for frequent delusions and paranoia.   Went to see patient in his room.  Patient stood up and started walking out, states "I do not want to talk and want to walk". Told patient that his primary doctor wants [place] to talk to him for a psych consult and we will come back in 15 minutes. Patient walked out in the hallway and came back and stated " I do not want to talk to psychiatry , you people destroy everyone's life by giving bullshit medicines". He refused to talk to psychiatry. Mental status could not be completed.   Per nursing staff , Pt has been hallucinating and talking about Govt. He has been compliant with his medications and care.   Recommendations Pt does not meet criteria for inpatient psych hospitalization.  Patient is not a danger to himself or others. -He has been compliant with his medication and care. -Psychiatry will sign off at this time.  Please reconsult if patient is a danger to himself or others.  Dr Leeanne Mannan [facility].

## 2021-06-05 NOTE — Social Work (Signed)
CSW contacted pt halfway house ([facility]) to confirm pt is able to return, they confirmed. Pt will need transportation back home.

## 2021-06-05 NOTE — TOC Initial Note (Addendum)
Transition of Care [facility]) - Initial/Assessment Note    Patient Details  Name: Paul [name] MRN: [id number] Date of Birth: Jan 06, 1966  Transition of [facility]) CM/SW Contact:    Angelita Ingles, RN Phone Number:[phone]  06/05/2021, 10:24 AM  Clinical Narrative:                 TOC consulted for d/c plan. CM at bedside to assess patient and discuss  plan. Patient states that he is from [place] which is a half way house. Patient states that he is current on his rent and is able to return after discharge. Patient states that he functioned independently at half way house prior to admission. Patient states that he has no DME or home health needs prior to admission. Patient does not have a PCP and is agreeable to CM setting up appoiuntment prior to d/c. There are currently no other needs. TOC will continue to follow.    Expected Discharge Plan: Home/Self Care (Half way house) Barriers to Discharge: Continued Medical Work up   Patient Goals and CMS Choice Patient states their goals for this hospitalization and ongoing recovery are:: Wants to get better to get out of here CMS Medicare.gov Compare Post Acute Care list provided to::  (n/a) Choice offered to / list presented to : NA  Expected Discharge Plan and Services Expected Discharge Plan: Home/Self Care (Half way house) In-house Referral: NA Discharge Planning Services: NA Post Acute Care Choice: NA Living arrangements for the past 2 months:  (Half way house)                 DME Arranged: N/A DME Agency: NA       HH Arranged: NA [place] Agency: NA        Prior Living Arrangements/Services Living arrangements for the past 2 months:  (Half way house) Lives with:: Other (Comment) (other half way house residents) Patient language and need for interpreter reviewed:: Yes Do you feel safe going back to the place where you live?: Yes        Care giver support system in place?: Yes (comment) Current home services:   (n/a) Criminal Activity/Legal Involvement Pertinent to Current Situation/Hospitalization: No - Comment as needed  Activities of Daily Living Home Assistive Devices/Equipment: None ADL Screening (condition at time of admission) Patient's cognitive ability adequate to safely complete daily activities?: Yes Is the patient deaf or have difficulty hearing?: No Does the patient have difficulty seeing, even when wearing glasses/contacts?: No Does the patient have difficulty concentrating, remembering, or making decisions?: No Patient able to express need for assistance with ADLs?: Yes Does the patient have difficulty dressing or bathing?: No Independently performs ADLs?: Yes (appropriate for developmental age) Does the patient have difficulty walking or climbing stairs?: No Weakness of Legs: None Weakness of Arms/Hands: None  Permission Sought/Granted   Permission granted to share information with : No              Emotional Assessment Appearance:: Appears stated age Attitude/Demeanor/Rapport: Gracious Affect (typically observed): Pleasant Orientation: : Oriented to Self, Oriented to Place, Oriented to  Time, Oriented to Situation Alcohol / Substance Use: Not Applicable Psych Involvement: No (comment)  Admission diagnosis:  Acute on chronic diastolic heart failure (HCC) [I50.33] AKI (acute kidney injury) ([place]) [N17.9] Dyspnea, unspecified type [R06.00] Acute renal failure superimposed on stage 4 chronic kidney disease, unspecified acute renal failure type ([place]) [N17.9, N18.4] Congestive heart failure, unspecified HF chronicity, unspecified heart failure type ([place]) [I50.9]  Patient Active Problem List   Diagnosis Date Noted   Acute on chronic diastolic heart failure ([place]) 06/02/2021   Acute on chronic combined systolic and diastolic CHF (congestive heart failure) ([place]) 06/02/2021   SOB (shortness of breath) 06/02/2021   Acute renal failure superimposed on stage 3a chronic kidney  disease ([place]) 06/02/2021   Elevated troponin 06/02/2021   Syncope and collapse 09/10/2011   CKD (chronic kidney disease), stage III ([place]) 09/10/2011   Sprain of right thumb 09/10/2011   Bipolar disorder ([place])    Schizophrenia ([place])    OBESITY 01/12/2009   Essential hypertension 01/12/2009   CHF 01/12/2009   UNSPEC COMBINED SYSTOLIC&DIASTOLIC HEART FAILURE 85/88/5027   OSTEOARTHRITIS 01/12/2009   PCP:  No primary care provider on file. Pharmacy:   [id number] - [place], [place] [address] Phone: [phone] Fax: [place] [address] [place] [address] Phone: [phone] Fax: [phone]     Social Determinants of Health (SDOH) Interventions    Readmission Risk Interventions Readmission Risk Prevention Plan 06/05/2021  Post Dischage Appt Complete  Medication Screening Complete  Transportation Screening Complete  Some recent data might be hidden

## 2021-06-05 NOTE — Plan of Care (Signed)

## 2021-06-05 NOTE — Progress Notes (Signed)
Physical Therapy Treatment and Discharge Patient Details Name: Paul [name] MRN: [id number] DOB: January 26, 1966 Today's Date: 06/05/2021   History of Present Illness 56 y.o. male presents to [facility] hospital on 06/01/2021 with SOB. Concern for CHF exacerbation. PMH includes chronic systolic/diastolic heart failure, hypertension, stage IIIa chronic kidney disease.    PT Comments    Pt met his physical therapy goals during his inpatient stay. Ambulating 500 feet with no assistive device and negotiated a flight of stairs without physical assist. SpO2 93% on RA, HR 76-114 bpm, BP 160/88 post mobility. Pt would benefit from continued mobilization while inpatient (already on mobility specialist caseload). No further acute PT needs. Thank you for this consult.     Recommendations for follow up therapy are one component of a multi-disciplinary discharge planning process, led by the attending physician.  Recommendations may be updated based on patient status, additional functional criteria and insurance authorization.  Follow Up Recommendations  No PT follow up     Assistance Recommended at Discharge PRN  Patient can return home with the following     Equipment Recommendations  None recommended by PT    Recommendations for Other Services       Precautions / Restrictions Precautions Precautions: Fall Restrictions Weight Bearing Restrictions: No     Mobility  Bed Mobility Overal bed mobility: Independent                  Transfers Overall transfer level: Independent Equipment used: None                    Ambulation/Gait Ambulation/Gait assistance: Modified independent (Device/Increase time) Gait Distance (Feet): 500 Feet Assistive device: None Gait Pattern/deviations: Step-through pattern, Decreased dorsiflexion - left, Decreased dorsiflexion - right Gait velocity: functional     General Gait Details: increased cadence, supervision for safety, no overt  LOB   Stairs Stairs: Yes Stairs assistance: Supervision Stair Management: One rail Right Number of Stairs: 24 General stair comments: step over step pattern   Wheelchair Mobility    Modified Rankin (Stroke Patients Only)       Balance Overall balance assessment: Needs assistance Sitting-balance support: No upper extremity supported, Feet supported Sitting balance-Leahy Scale: Good     Standing balance support: No upper extremity supported, During functional activity Standing balance-Leahy Scale: Good                              Cognition Arousal/Alertness: Awake/alert Behavior During Therapy: WFL for tasks assessed/performed Overall Cognitive Status: Within Functional Limits for tasks assessed                                          Exercises      General Comments        Pertinent Vitals/Pain Pain Assessment Pain Assessment: No/denies pain    Home Living                          Prior Function            PT Goals (current goals can now be found in the care plan section) Acute Rehab PT Goals Patient Stated Goal: to improve activity tolerance PT Goal Formulation: With patient Time For Goal Achievement: 06/16/21 Potential to Achieve Goals: Good Progress towards PT goals: Goals met/education completed, patient  discharged from PT    Frequency    Min 3X/week      PT Plan Other (comment) (d/c therapies)    Co-evaluation              AM-PAC PT "6 Clicks" Mobility   Outcome Measure  Help needed turning from your back to your side while in a flat bed without using bedrails?: None Help needed moving from lying on your back to sitting on the side of a flat bed without using bedrails?: None Help needed moving to and from a bed to a chair (including a wheelchair)?: None Help needed standing up from a chair using your arms (e.g., wheelchair or bedside chair)?: None Help needed to walk in hospital room?: A  Little Help needed climbing 3-5 steps with a railing? : A Little 6 Click Score: 22    End of Session   Activity Tolerance: Patient tolerated treatment well Patient left: in bed;with call bell/phone within reach Nurse Communication: Mobility status PT Visit Diagnosis: Other abnormalities of gait and mobility (R26.89)     Time: 5320-2334 PT Time Calculation (min) (ACUTE ONLY): 18 min  Charges:  $Therapeutic Activity: 8-22 mins                     Wyona Almas, PT, DPT Acute Rehabilitation Services Pager [phone] Office [phone]    Deno Etienne 06/05/2021, 9:07 AM

## 2021-06-05 NOTE — Discharge Instructions (Addendum)
Follow with Primary MD in 7 days   Get CBC, CMP, 2 view Chest X ray -  checked next visit within 1 week by Primary MD    Activity: As tolerated with Full fall precautions use walker/cane & assistance as needed  Disposition Home    Diet: Heart Healthy, 1.5 lit/day fluid restriction  Special Instructions: If you have smoked or chewed Tobacco  in the last 2 yrs please stop smoking, stop any regular Alcohol  and or any Recreational drug use.  On your next visit with your primary care physician please Get Medicines reviewed and adjusted.  Please request your Prim.MD to go over all Hospital Tests and Procedure/Radiological results at the follow up, please get all Hospital records sent to your Prim MD by signing hospital release before you go home.  If you experience worsening of your admission symptoms, develop shortness of breath, life threatening emergency, suicidal or homicidal thoughts you must seek medical attention immediately by calling 911 or calling your MD immediately  if symptoms less severe.  You Must read complete instructions/literature along with all the possible adverse reactions/side effects for all the Medicines you take and that have been prescribed to you. Take any new Medicines after you have completely understood and accpet all the possible adverse reactions/side effects.

## 2021-06-07 ENCOUNTER — Other Ambulatory Visit: Payer: Self-pay

## 2021-06-07 ENCOUNTER — Encounter: Payer: Self-pay | Admitting: Internal Medicine

## 2021-06-07 ENCOUNTER — Ambulatory Visit: Payer: Medicaid Other | Attending: Internal Medicine | Admitting: Internal Medicine

## 2021-06-07 VITALS — BP 106/73 | HR 74 | Resp 16 | Ht 63.0 in | Wt 183.8 lb

## 2021-06-07 DIAGNOSIS — I130 Hypertensive heart and chronic kidney disease with heart failure and stage 1 through stage 4 chronic kidney disease, or unspecified chronic kidney disease: Secondary | ICD-10-CM | POA: Diagnosis not present

## 2021-06-07 DIAGNOSIS — D631 Anemia in chronic kidney disease: Secondary | ICD-10-CM | POA: Diagnosis not present

## 2021-06-07 DIAGNOSIS — F1091 Alcohol use, unspecified, in remission: Secondary | ICD-10-CM

## 2021-06-07 DIAGNOSIS — Z7901 Long term (current) use of anticoagulants: Secondary | ICD-10-CM | POA: Diagnosis not present

## 2021-06-07 DIAGNOSIS — I5043 Acute on chronic combined systolic (congestive) and diastolic (congestive) heart failure: Secondary | ICD-10-CM | POA: Diagnosis present

## 2021-06-07 DIAGNOSIS — N184 Chronic kidney disease, stage 4 (severe): Secondary | ICD-10-CM | POA: Insufficient documentation

## 2021-06-07 DIAGNOSIS — E782 Mixed hyperlipidemia: Secondary | ICD-10-CM | POA: Insufficient documentation

## 2021-06-07 DIAGNOSIS — Z6832 Body mass index (BMI) 32.0-32.9, adult: Secondary | ICD-10-CM | POA: Insufficient documentation

## 2021-06-07 DIAGNOSIS — Z79899 Other long term (current) drug therapy: Secondary | ICD-10-CM | POA: Diagnosis not present

## 2021-06-07 DIAGNOSIS — E669 Obesity, unspecified: Secondary | ICD-10-CM | POA: Insufficient documentation

## 2021-06-07 DIAGNOSIS — Z7689 Persons encountering health services in other specified circumstances: Secondary | ICD-10-CM

## 2021-06-07 DIAGNOSIS — H548 Legal blindness, as defined in USA: Secondary | ICD-10-CM

## 2021-06-07 DIAGNOSIS — F1011 Alcohol abuse, in remission: Secondary | ICD-10-CM | POA: Diagnosis not present

## 2021-06-07 DIAGNOSIS — Z713 Dietary counseling and surveillance: Secondary | ICD-10-CM | POA: Insufficient documentation

## 2021-06-07 DIAGNOSIS — Z1211 Encounter for screening for malignant neoplasm of colon: Secondary | ICD-10-CM | POA: Diagnosis not present

## 2021-06-07 DIAGNOSIS — I5042 Chronic combined systolic (congestive) and diastolic (congestive) heart failure: Secondary | ICD-10-CM

## 2021-06-07 DIAGNOSIS — R7989 Other specified abnormal findings of blood chemistry: Secondary | ICD-10-CM

## 2021-06-07 DIAGNOSIS — I10 Essential (primary) hypertension: Secondary | ICD-10-CM | POA: Diagnosis not present

## 2021-06-07 DIAGNOSIS — F319 Bipolar disorder, unspecified: Secondary | ICD-10-CM | POA: Insufficient documentation

## 2021-06-07 MED ORDER — HYDRALAZINE HCL 50 MG PO TABS: 50.0000 mg | ORAL_TABLET | Freq: Three times a day (TID) | ORAL | 1 refills | Status: DC

## 2021-06-07 MED ORDER — ISOSORBIDE MONONITRATE ER 60 MG PO TB24: 60.0000 mg | ORAL_TABLET | Freq: Every day | ORAL | 1 refills | Status: DC

## 2021-06-07 MED ORDER — ASPIRIN 81 MG PO CHEW: 81.0000 mg | CHEWABLE_TABLET | Freq: Every day | ORAL | 1 refills | Status: DC

## 2021-06-07 MED ORDER — TORSEMIDE 20 MG PO TABS: 20.0000 mg | ORAL_TABLET | Freq: Every day | ORAL | 1 refills | Status: DC

## 2021-06-07 MED ORDER — CARVEDILOL 6.25 MG PO TABS: 6.2500 mg | ORAL_TABLET | Freq: Two times a day (BID) | ORAL | 1 refills | Status: DC

## 2021-06-07 MED ORDER — FENOFIBRATE 54 MG PO TABS: 54.0000 mg | ORAL_TABLET | Freq: Every day | ORAL | 1 refills | Status: DC

## 2021-06-07 MED ORDER — ROSUVASTATIN CALCIUM 5 MG PO TABS: 5.0000 mg | ORAL_TABLET | Freq: Every day | ORAL | 1 refills | Status: DC

## 2021-06-07 NOTE — Patient Instructions (Signed)
Please return to the lab in about 1 week to have your liver function and kidney function rechecked with blood test.  Try to weigh yourself at least once a week.  If you find that your weight increases by more than 5 pounds or you develop shortness of breath and swelling in the legs, you should take 1 extra dose of the torsemide that day.

## 2021-06-07 NOTE — Progress Notes (Signed)
Patient ID: Paul [name], male    DOB: [birth year]  MRN: [id number]  CC: Hospital follow-up/new patient visit  Subjective: Paul [name] is a 56 y.o. male who presents for hospital follow-up and new patient visit. His concerns today include: Patient with history of HTN, HL, combined systolic and diastolic CHF, CKD 4, obesity, bipolar disorder/schizophrenia (listed on problem list)  Patient hospitalized 1/27-1930 05/2021 with shortness of breath due to CHF and AKI.  Patient was diuresed.  AST and ALT were elevated but trended down to almost normal by the time of discharge.  Echo revealed EF of 30 to 35% left ventricular demonstrated global hypokinesis and cavity was mildly dilated, grade 2 diastolic dysfunction.  GFR was in the range for CKD stage IV.  Patient seen by cardiology and nephrology.  He was discharged home in stable condition on torsemide, Crestor, hydralazine, carvedilol, isosorbide, Crestor and aspirin.  Today: HYPERTENSION/combine CHF Currently taking: see medication list.  He is taking the medications that are listed above.  He has bottles with him. Med Adherence: [x]  Yes    []  No Medication side effects: []  Yes    [x]  No Adherence with salt restriction: [x]  Yes    []  No Home Monitoring?: [x]  Yes    []  No Monitoring Frequency:  Home BP results range:  SOB? []  Yes    [x]  No Chest Pain?: []  Yes    [x]  No Leg swelling?: []  Yes    [x]  No Headaches?: []  Yes    []  No Dizziness? []  Yes    []  No Comments: Elevated LFTs during hospitalization may have been due to congestive hepatopathy  CKD4:  has appt with Dr. [name] on 06/21/21 Not on NSAIDs  Anemia: He has a normocytic anemia.  Hemoglobin is 10.1.  Iron studies done in the hospital were consistent with anemia of chronic disease.  Patient denies history of bipolar disorder or schizophrenia.  He tells me "I do not need no psychiatrist talking their psycho bullshit."  He tells me that they coerce people into taking  medications that destroy the brain.  Reports being in a mental institution once while in prison but did not want to give any further details. He currently lives in a "sober house."  He has been clean of alcohol for over 2 years.  He does not smoke.  Noted to have closure of the right eye.  He tells me that he had a stroke around 2006 that left him blind in that eye.  HM: Due for colon cancer screening.  Never had colonoscopy.  No family history of colon cancer.   Patient Active Problem List   Diagnosis Date Noted   Acute on chronic diastolic heart failure ([place]) 06/02/2021   Acute on chronic combined systolic and diastolic CHF (congestive heart failure) ([place]) 06/02/2021   SOB (shortness of breath) 06/02/2021   Acute renal failure superimposed on stage 3a chronic kidney disease ([place]) 06/02/2021   Elevated troponin 06/02/2021   Syncope and collapse 09/10/2011   CKD (chronic kidney disease), stage III ([place]) 09/10/2011   Sprain of right thumb 09/10/2011   Bipolar disorder ([place])    Schizophrenia ([place])    OBESITY 01/12/2009   Essential hypertension 01/12/2009   CHF 01/12/2009   UNSPEC COMBINED SYSTOLIC&DIASTOLIC HEART FAILURE 32/35/5732   OSTEOARTHRITIS 01/12/2009     No current outpatient medications on file prior to visit.   No current facility-administered medications on file prior to visit.    Allergies  Allergen Reactions  Penicillins Other (See Comments)    childhood    Social History   Socioeconomic History   Marital status: Legally Separated    Spouse name: Not on file   Number of children: Not on file   Years of education: Not on file   Highest education level: Not on file  Occupational History   Occupation: Furniture conservator/restorer  Tobacco Use   Smoking status: Former   Smokeless tobacco: Never   Tobacco comments:    Quit smoking 20-30 years ago, smoked for 1 year.  Vaping Use   Vaping Use: Not on file  Substance and Sexual Activity   Alcohol use: No   Drug use: No    Sexual activity: Yes    Birth control/protection: None  Other Topics Concern   Not on file  Social History Narrative   Not on file   Social Determinants of Health   Financial Resource Strain: Not on file  Food Insecurity: Not on file  Transportation Needs: Not on file  Physical Activity: Not on file  Stress: Not on file  Social Connections: Not on file  Intimate Partner Violence: Not on file    Family History  Problem Relation Age of Onset   Hypertension Father    Stroke Father     Past Surgical History:  Procedure Laterality Date   JOINT REPLACEMENT     right hip replacement   right eye surgery     for glaucoma   TOTAL HIP ARTHROPLASTY Bilateral 1997, 2001    ROS: Review of Systems Negative except as stated above  PHYSICAL EXAM: BP 106/73    Pulse 74    Resp 16    Ht 5\' 3"  (1.6 m)    Wt 183 lb 12.8 oz (83.4 kg)    SpO2 98%    BMI 32.56 kg/m   Wt Readings from Last 3 Encounters:  06/07/21 183 lb 12.8 oz (83.4 kg)  06/05/21 175 lb 11.3 oz (79.7 kg)  11/03/19 185 lb 4.8 oz (84.1 kg)    Physical Exam General appearance - alert, well appearing, older Caucasian male and in no distress Mental status - normal mood, behavior, speech, dress, motor activity, and thought processes Eyes -right eye lid is closed shut.  Eyeball itself appears opaque Nose - normal and patent, no erythema, discharge or polyps Mouth -patient has dentures above and below. Neck - supple, no significant adenopathy Chest - clear to auscultation, no wheezes, rales or rhonchi, symmetric air entry Heart - normal rate, regular rhythm, normal S1, S2, no murmurs, rubs, clicks or gallops Extremities -trace lower extremity edema CMP Latest Ref Rng & Units 06/05/2021 06/04/2021 06/03/2021  Glucose 70 - 99 mg/dL 105(H) 97 117(H)  BUN 6 - 20 mg/dL 68(H) 69(H) 60(H)  Creatinine 0.61 - 1.24 mg/dL 4.59(H) 4.97(H) 4.38(H)  Sodium 135 - 145 mmol/L 137 136 137  Potassium 3.5 - 5.1 mmol/L 4.0 4.2 4.4  Chloride 98  - 111 mmol/L 107 104 105  CO2 22 - 32 mmol/L 21(L) 20(L) 20(L)  Calcium 8.9 - 10.3 mg/dL 6.7(L) 7.4(L) 7.7(L)  Total Protein 6.5 - 8.1 g/dL 6.8 7.2 6.4(L)  Total Bilirubin 0.3 - 1.2 mg/dL 0.4 0.7 0.6  Alkaline Phos 38 - 126 U/L 85 88 86  AST 15 - 41 U/L 17 38 45(H)  ALT 0 - 44 U/L 55(H) 78(H) 81(H)   Lipid Panel     Component Value Date/Time   CHOL 163 06/05/2021 0057   TRIG 230 (H) 06/05/2021 0057  HDL 37 (L) 06/05/2021 0057   CHOLHDL 4.4 06/05/2021 0057   VLDL 46 (H) 06/05/2021 0057   LDLCALC 80 06/05/2021 0057    CBC    Component Value Date/Time   WBC 9.6 06/05/2021 0057   RBC 3.43 (L) 06/05/2021 0057   HGB 10.1 (L) 06/05/2021 0057   HCT 29.5 (L) 06/05/2021 0057   PLT 307 06/05/2021 0057   MCV 86.0 06/05/2021 0057   MCH 29.4 06/05/2021 0057   MCHC 34.2 06/05/2021 0057   RDW 14.3 06/05/2021 0057   LYMPHSABS 1.7 06/05/2021 0057   MONOABS 0.9 06/05/2021 0057   EOSABS 0.5 06/05/2021 0057   BASOSABS 0.1 06/05/2021 0057   Lab Results  Component Value Date   IRON 65 06/03/2021   TIBC 286 06/03/2021   FERRITIN 228 06/03/2021    ASSESSMENT AND PLAN:  1. Encounter to establish care   2. Chronic combined systolic and diastolic CHF (congestive heart failure) (HCC) DASH diet advised. Advised patient to weigh himself at least once a week.  If weight increases by 5 pounds or more or if he develops shortness of breath and/or lower extremity edema, he should take an extra dose of the torsemide. Keep upcoming appointment with cardiology Continue current medications. - torsemide (DEMADEX) 20 MG tablet; Take 1 tablet (20 mg total) by mouth daily.  Dispense: 90 tablet; Refill: 1 - hydrALAZINE (APRESOLINE) 50 MG tablet; Take 1 tablet (50 mg total) by mouth every 8 (eight) hours.  Dispense: 270 tablet; Refill: 1 - carvedilol (COREG) 6.25 MG tablet; Take 1 tablet (6.25 mg total) by mouth 2 (two) times daily with a meal.  Dispense: 180 tablet; Refill: 1 - isosorbide mononitrate  (IMDUR) 60 MG 24 hr tablet; Take 1 tablet (60 mg total) by mouth daily.  Dispense: 90 tablet; Refill: 1  3. Essential hypertension At goal.  Continue current medications - hydrALAZINE (APRESOLINE) 50 MG tablet; Take 1 tablet (50 mg total) by mouth every 8 (eight) hours.  Dispense: 270 tablet; Refill: 1 - carvedilol (COREG) 6.25 MG tablet; Take 1 tablet (6.25 mg total) by mouth 2 (two) times daily with a meal.  Dispense: 180 tablet; Refill: 1  4. CKD (chronic kidney disease) stage 4, GFR 15-29 ml/min (HCC) Advised to avoid NSAIDs.  Keep upcoming appointment with nephrologist. - Comprehensive metabolic panel; Future  5. Anemia due to stage 4 chronic kidney disease ([place]) Observe for now.  Nephrology will determine whether he needs ESA   Abnormal LFTs Elevation is likely due to congestive hepatopathy.  AST and ALT were trending down by the time of hospital discharge.  He will return to the lab in 1 week for repeat chemistry. - Comprehensive metabolic panel; Future  7. Alcohol use disorder in remission Commended him on sobriety.  8. Mixed hyperlipidemia - rosuvastatin (CRESTOR) 5 MG tablet; Take 1 tablet (5 mg total) by mouth daily.  Dispense: 90 tablet; Refill: 1 - fenofibrate 54 MG tablet; Take 1 tablet (54 mg total) by mouth daily.  Dispense: 90 tablet; Refill: 1  9. Legally blind in right eye, as defined in [place]   10. Screening for colon cancer Discussed colon cancer screening methods.  Patient wanting to do Cologuard test. - Cologuard    Patient was given the opportunity to ask questions.  Patient verbalized understanding of the plan and was able to repeat key elements of the plan.   Orders Placed This Encounter  Procedures   Cologuard   Comprehensive metabolic panel     Requested Prescriptions  Signed Prescriptions Disp Refills   torsemide (DEMADEX) 20 MG tablet 90 tablet 1    Sig: Take 1 tablet (20 mg total) by mouth daily.   rosuvastatin (CRESTOR) 5 MG tablet 90  tablet 1    Sig: Take 1 tablet (5 mg total) by mouth daily.   hydrALAZINE (APRESOLINE) 50 MG tablet 270 tablet 1    Sig: Take 1 tablet (50 mg total) by mouth every 8 (eight) hours.   fenofibrate 54 MG tablet 90 tablet 1    Sig: Take 1 tablet (54 mg total) by mouth daily.   carvedilol (COREG) 6.25 MG tablet 180 tablet 1    Sig: Take 1 tablet (6.25 mg total) by mouth 2 (two) times daily with a meal.   isosorbide mononitrate (IMDUR) 60 MG 24 hr tablet 90 tablet 1    Sig: Take 1 tablet (60 mg total) by mouth daily.   aspirin 81 MG chewable tablet 100 tablet 1    Sig: Chew 1 tablet (81 mg total) by mouth daily.    Return in about 4 months (around 10/05/2021).  Karle Plumber, MD, FACP

## 2021-06-08 ENCOUNTER — Other Ambulatory Visit: Payer: Self-pay

## 2021-06-14 ENCOUNTER — Ambulatory Visit: Payer: Medicaid Other | Attending: Internal Medicine

## 2021-06-14 DIAGNOSIS — R7989 Other specified abnormal findings of blood chemistry: Secondary | ICD-10-CM

## 2021-06-14 DIAGNOSIS — N184 Chronic kidney disease, stage 4 (severe): Secondary | ICD-10-CM

## 2021-06-15 LAB — COMPREHENSIVE METABOLIC PANEL
ALT: 17 IU/L (ref 0–44)
AST: 16 IU/L (ref 0–40)
Albumin/Globulin Ratio: 1.6 (ref 1.2–2.2)
Albumin: 4.1 g/dL (ref 3.8–4.9)
Alkaline Phosphatase: 82 IU/L (ref 44–121)
BUN/Creatinine Ratio: 13 (ref 9–20)
BUN: 52 mg/dL — ABNORMAL HIGH (ref 6–24)
Bilirubin Total: 0.3 mg/dL (ref 0.0–1.2)
CO2: 18 mmol/L — ABNORMAL LOW (ref 20–29)
Calcium: 8 mg/dL — ABNORMAL LOW (ref 8.7–10.2)
Chloride: 104 mmol/L (ref 96–106)
Creatinine, Ser: 3.98 mg/dL — ABNORMAL HIGH (ref 0.76–1.27)
Globulin, Total: 2.6 g/dL (ref 1.5–4.5)
Glucose: 86 mg/dL (ref 70–99)
Potassium: 5.1 mmol/L (ref 3.5–5.2)
Sodium: 138 mmol/L (ref 134–144)
Total Protein: 6.7 g/dL (ref 6.0–8.5)
eGFR: 17 mL/min/{1.73_m2} — ABNORMAL LOW (ref >59)

## 2021-06-20 ENCOUNTER — Telehealth: Payer: Self-pay

## 2021-06-20 NOTE — Telephone Encounter (Signed)
Contacted pt to go over lab results pt is aware and doesn't have any questions or concerns 

## 2021-06-22 ENCOUNTER — Telehealth: Payer: Self-pay

## 2021-06-22 NOTE — Telephone Encounter (Signed)
Form has been located. Will call pt once it ready for pick up

## 2021-06-22 NOTE — Telephone Encounter (Signed)
Patient dropped off handicap placard, advised 7-10 business day.

## 2021-06-27 ENCOUNTER — Other Ambulatory Visit: Payer: Self-pay

## 2021-06-28 ENCOUNTER — Ambulatory Visit: Payer: Medicaid Other | Admitting: Internal Medicine

## 2021-07-03 NOTE — Telephone Encounter (Signed)
Contacted pt on both numbers and was not able to lvm.. Will place form up front for pick up

## 2021-07-11 ENCOUNTER — Other Ambulatory Visit: Payer: Self-pay

## 2021-09-21 ENCOUNTER — Encounter: Payer: Self-pay | Admitting: Internal Medicine

## 2021-10-05 ENCOUNTER — Ambulatory Visit: Payer: Medicaid Other | Admitting: Internal Medicine

## 2022-05-11 ENCOUNTER — Inpatient Hospital Stay (HOSPITAL_COMMUNITY)
Admission: EM | Admit: 2022-05-11 | Discharge: 2022-05-17 | DRG: 673 | Disposition: A | Discharge status: Home / Self Care | Payer: Medicaid Other | Attending: Internal Medicine | Admitting: Internal Medicine

## 2022-05-11 ENCOUNTER — Other Ambulatory Visit: Payer: Self-pay

## 2022-05-11 ENCOUNTER — Emergency Department (HOSPITAL_COMMUNITY): Payer: Medicaid Other

## 2022-05-11 DIAGNOSIS — E782 Mixed hyperlipidemia: Secondary | ICD-10-CM

## 2022-05-11 DIAGNOSIS — I132 Hypertensive heart and chronic kidney disease with heart failure and with stage 5 chronic kidney disease, or end stage renal disease: Secondary | ICD-10-CM | POA: Diagnosis present

## 2022-05-11 DIAGNOSIS — H5461 Unqualified visual loss, right eye, normal vision left eye: Secondary | ICD-10-CM | POA: Diagnosis present

## 2022-05-11 DIAGNOSIS — N179 Acute kidney failure, unspecified: Principal | ICD-10-CM | POA: Diagnosis present

## 2022-05-11 DIAGNOSIS — D649 Anemia, unspecified: Secondary | ICD-10-CM

## 2022-05-11 DIAGNOSIS — T447X6A Underdosing of beta-adrenoreceptor antagonists, initial encounter: Secondary | ICD-10-CM | POA: Diagnosis present

## 2022-05-11 DIAGNOSIS — Z880 Allergy status to penicillin: Secondary | ICD-10-CM

## 2022-05-11 DIAGNOSIS — Z96643 Presence of artificial hip joint, bilateral: Secondary | ICD-10-CM | POA: Diagnosis present

## 2022-05-11 DIAGNOSIS — Z6832 Body mass index (BMI) 32.0-32.9, adult: Secondary | ICD-10-CM

## 2022-05-11 DIAGNOSIS — I509 Heart failure, unspecified: Secondary | ICD-10-CM

## 2022-05-11 DIAGNOSIS — N250 Renal osteodystrophy: Secondary | ICD-10-CM | POA: Diagnosis present

## 2022-05-11 DIAGNOSIS — T501X6A Underdosing of loop [high-ceiling] diuretics, initial encounter: Secondary | ICD-10-CM | POA: Diagnosis present

## 2022-05-11 DIAGNOSIS — I5042 Chronic combined systolic (congestive) and diastolic (congestive) heart failure: Secondary | ICD-10-CM

## 2022-05-11 DIAGNOSIS — Z87891 Personal history of nicotine dependence: Secondary | ICD-10-CM

## 2022-05-11 DIAGNOSIS — F209 Schizophrenia, unspecified: Secondary | ICD-10-CM | POA: Diagnosis present

## 2022-05-11 DIAGNOSIS — N2581 Secondary hyperparathyroidism of renal origin: Secondary | ICD-10-CM | POA: Diagnosis present

## 2022-05-11 DIAGNOSIS — E872 Acidosis, unspecified: Secondary | ICD-10-CM | POA: Diagnosis present

## 2022-05-11 DIAGNOSIS — E785 Hyperlipidemia, unspecified: Secondary | ICD-10-CM | POA: Diagnosis present

## 2022-05-11 DIAGNOSIS — D631 Anemia in chronic kidney disease: Secondary | ICD-10-CM | POA: Diagnosis present

## 2022-05-11 DIAGNOSIS — I5040 Unspecified combined systolic (congestive) and diastolic (congestive) heart failure: Secondary | ICD-10-CM

## 2022-05-11 DIAGNOSIS — N186 End stage renal disease: Secondary | ICD-10-CM | POA: Diagnosis present

## 2022-05-11 DIAGNOSIS — F319 Bipolar disorder, unspecified: Secondary | ICD-10-CM | POA: Diagnosis present

## 2022-05-11 DIAGNOSIS — Z91128 Patient's intentional underdosing of medication regimen for other reason: Secondary | ICD-10-CM

## 2022-05-11 DIAGNOSIS — T465X6A Underdosing of other antihypertensive drugs, initial encounter: Secondary | ICD-10-CM | POA: Diagnosis present

## 2022-05-11 DIAGNOSIS — D62 Acute posthemorrhagic anemia: Secondary | ICD-10-CM | POA: Diagnosis present

## 2022-05-11 DIAGNOSIS — Z8249 Family history of ischemic heart disease and other diseases of the circulatory system: Secondary | ICD-10-CM

## 2022-05-11 DIAGNOSIS — Z91158 Patient's noncompliance with renal dialysis for other reason: Secondary | ICD-10-CM

## 2022-05-11 DIAGNOSIS — I160 Hypertensive urgency: Secondary | ICD-10-CM | POA: Insufficient documentation

## 2022-05-11 DIAGNOSIS — I10 Essential (primary) hypertension: Secondary | ICD-10-CM | POA: Diagnosis present

## 2022-05-11 DIAGNOSIS — E669 Obesity, unspecified: Secondary | ICD-10-CM | POA: Diagnosis present

## 2022-05-11 DIAGNOSIS — T463X6A Underdosing of coronary vasodilators, initial encounter: Secondary | ICD-10-CM | POA: Diagnosis present

## 2022-05-11 DIAGNOSIS — D509 Iron deficiency anemia, unspecified: Secondary | ICD-10-CM | POA: Diagnosis present

## 2022-05-11 DIAGNOSIS — T466X6A Underdosing of antihyperlipidemic and antiarteriosclerotic drugs, initial encounter: Secondary | ICD-10-CM | POA: Diagnosis present

## 2022-05-11 DIAGNOSIS — I2489 Other forms of acute ischemic heart disease: Secondary | ICD-10-CM | POA: Diagnosis present

## 2022-05-11 DIAGNOSIS — Z823 Family history of stroke: Secondary | ICD-10-CM

## 2022-05-11 DIAGNOSIS — R7989 Other specified abnormal findings of blood chemistry: Secondary | ICD-10-CM

## 2022-05-11 DIAGNOSIS — Z515 Encounter for palliative care: Secondary | ICD-10-CM

## 2022-05-11 DIAGNOSIS — R040 Epistaxis: Secondary | ICD-10-CM | POA: Diagnosis present

## 2022-05-11 DIAGNOSIS — I5043 Acute on chronic combined systolic (congestive) and diastolic (congestive) heart failure: Secondary | ICD-10-CM | POA: Diagnosis present

## 2022-05-11 DIAGNOSIS — I161 Hypertensive emergency: Secondary | ICD-10-CM | POA: Diagnosis present

## 2022-05-11 DIAGNOSIS — I5033 Acute on chronic diastolic (congestive) heart failure: Secondary | ICD-10-CM | POA: Diagnosis present

## 2022-05-11 DIAGNOSIS — N189 Chronic kidney disease, unspecified: Principal | ICD-10-CM

## 2022-05-11 DIAGNOSIS — T39016A Underdosing of aspirin, initial encounter: Secondary | ICD-10-CM | POA: Diagnosis present

## 2022-05-11 DIAGNOSIS — Z992 Dependence on renal dialysis: Secondary | ICD-10-CM

## 2022-05-11 MED ORDER — OXYMETAZOLINE HCL 0.05 % NA SOLN
1.0000 | Freq: Once | NASAL | Status: AC
  Administered 2022-05-11: 1 via NASAL
  Filled 2022-05-11: qty 30

## 2022-05-11 NOTE — ED Triage Notes (Signed)
Patient arrived with EMS from home reports elevated blood pressure this evening ( non compliant with medications ) BP = 176/100 , CBG= 140, brief epistaxis , no bleeding at arrival .

## 2022-05-11 NOTE — ED Provider Triage Note (Addendum)
Emergency Medicine Provider Triage Evaluation Note  Paul [name] , a 57 y.o. male  was evaluated in triage.  Pt complains of nosebleed that started 2 hours before arrival and has been persistent since.  He is not on any anticoagulation.  Denies any facial trauma states that it started spontaneously he has had a history of nosebleeds in the past but never quite this severe. No sinus or nasal surgery history.  He is prescribed antihypertensive medications but does not take them.  Has a history of central retinal artery occlusion.  He states he is in poor health but does not take his medications.  Review of Systems  Positive: Nosebleed Negative: Fever  Physical Exam  BP (!) 158/126 (BP Location: Right Arm)   Pulse (!) 111   Temp 98 F (36.7 C) (Oral)   Resp 12   SpO2 99%  Gen:   Awake Resp:  Normal effort  MSK:   Moves extremities without difficulty  Other:  Epistaxis from right nare.  Packed with Afrin soaked gauze and compression applied.  Medical Decision Making  Medically screening exam initiated at 11:33 PM.  Appropriate orders placed.  Paul [name] was informed that the remainder of the evaluation will be completed by another provider, this initial triage assessment does not replace that evaluation, and the importance of remaining in the ED until their evaluation is complete.  Will need to be reevaluated.  Given his dyspnea on my evaluation with his mild tachypnea and his medical history and lack of compliance of his medications I feel that he would benefit from some additional workup including chest x-ray and labs.    Tedd Sias, [place] 05/11/22 2334    Tedd Sias, [place] 05/11/22 2336

## 2022-05-12 ENCOUNTER — Inpatient Hospital Stay (HOSPITAL_COMMUNITY): Payer: Medicaid Other

## 2022-05-12 ENCOUNTER — Encounter (HOSPITAL_COMMUNITY): Payer: Self-pay | Admitting: Internal Medicine

## 2022-05-12 DIAGNOSIS — E872 Acidosis, unspecified: Secondary | ICD-10-CM | POA: Insufficient documentation

## 2022-05-12 DIAGNOSIS — I5040 Unspecified combined systolic (congestive) and diastolic (congestive) heart failure: Secondary | ICD-10-CM | POA: Diagnosis not present

## 2022-05-12 DIAGNOSIS — F319 Bipolar disorder, unspecified: Secondary | ICD-10-CM | POA: Diagnosis present

## 2022-05-12 DIAGNOSIS — H5461 Unqualified visual loss, right eye, normal vision left eye: Secondary | ICD-10-CM | POA: Diagnosis present

## 2022-05-12 DIAGNOSIS — Z87891 Personal history of nicotine dependence: Secondary | ICD-10-CM | POA: Diagnosis not present

## 2022-05-12 DIAGNOSIS — N179 Acute kidney failure, unspecified: Secondary | ICD-10-CM | POA: Diagnosis present

## 2022-05-12 DIAGNOSIS — Z91158 Patient's noncompliance with renal dialysis for other reason: Secondary | ICD-10-CM | POA: Diagnosis not present

## 2022-05-12 DIAGNOSIS — Z515 Encounter for palliative care: Secondary | ICD-10-CM | POA: Diagnosis not present

## 2022-05-12 DIAGNOSIS — E669 Obesity, unspecified: Secondary | ICD-10-CM | POA: Diagnosis present

## 2022-05-12 DIAGNOSIS — D509 Iron deficiency anemia, unspecified: Secondary | ICD-10-CM | POA: Diagnosis present

## 2022-05-12 DIAGNOSIS — I132 Hypertensive heart and chronic kidney disease with heart failure and with stage 5 chronic kidney disease, or end stage renal disease: Secondary | ICD-10-CM | POA: Diagnosis present

## 2022-05-12 DIAGNOSIS — D631 Anemia in chronic kidney disease: Secondary | ICD-10-CM | POA: Diagnosis present

## 2022-05-12 DIAGNOSIS — I5021 Acute systolic (congestive) heart failure: Secondary | ICD-10-CM | POA: Diagnosis not present

## 2022-05-12 DIAGNOSIS — Z7189 Other specified counseling: Secondary | ICD-10-CM | POA: Diagnosis not present

## 2022-05-12 DIAGNOSIS — Z992 Dependence on renal dialysis: Secondary | ICD-10-CM | POA: Diagnosis not present

## 2022-05-12 DIAGNOSIS — R040 Epistaxis: Secondary | ICD-10-CM | POA: Diagnosis present

## 2022-05-12 DIAGNOSIS — I161 Hypertensive emergency: Secondary | ICD-10-CM | POA: Diagnosis present

## 2022-05-12 DIAGNOSIS — Z6832 Body mass index (BMI) 32.0-32.9, adult: Secondary | ICD-10-CM | POA: Diagnosis not present

## 2022-05-12 DIAGNOSIS — N2581 Secondary hyperparathyroidism of renal origin: Secondary | ICD-10-CM | POA: Diagnosis present

## 2022-05-12 DIAGNOSIS — I10 Essential (primary) hypertension: Secondary | ICD-10-CM | POA: Diagnosis not present

## 2022-05-12 DIAGNOSIS — I5033 Acute on chronic diastolic (congestive) heart failure: Secondary | ICD-10-CM | POA: Diagnosis not present

## 2022-05-12 DIAGNOSIS — N189 Chronic kidney disease, unspecified: Secondary | ICD-10-CM

## 2022-05-12 DIAGNOSIS — I5043 Acute on chronic combined systolic (congestive) and diastolic (congestive) heart failure: Secondary | ICD-10-CM | POA: Diagnosis present

## 2022-05-12 DIAGNOSIS — N186 End stage renal disease: Secondary | ICD-10-CM | POA: Diagnosis present

## 2022-05-12 DIAGNOSIS — D62 Acute posthemorrhagic anemia: Secondary | ICD-10-CM | POA: Insufficient documentation

## 2022-05-12 DIAGNOSIS — E785 Hyperlipidemia, unspecified: Secondary | ICD-10-CM | POA: Diagnosis present

## 2022-05-12 DIAGNOSIS — I509 Heart failure, unspecified: Secondary | ICD-10-CM

## 2022-05-12 DIAGNOSIS — F209 Schizophrenia, unspecified: Secondary | ICD-10-CM | POA: Diagnosis present

## 2022-05-12 DIAGNOSIS — I160 Hypertensive urgency: Secondary | ICD-10-CM | POA: Insufficient documentation

## 2022-05-12 DIAGNOSIS — I2489 Other forms of acute ischemic heart disease: Secondary | ICD-10-CM | POA: Diagnosis present

## 2022-05-12 LAB — URINALYSIS, ROUTINE W REFLEX MICROSCOPIC
Bilirubin Urine: NEGATIVE
Glucose, UA: NEGATIVE mg/dL
Ketones, ur: NEGATIVE mg/dL
Leukocytes,Ua: NEGATIVE
Nitrite: NEGATIVE
Protein, ur: 100 mg/dL — AB
Specific Gravity, Urine: 1.01 (ref 1.005–1.030)
pH: 6 (ref 5.0–8.0)

## 2022-05-12 LAB — CBC
HCT: 19.1 % — ABNORMAL LOW (ref 39.0–52.0)
HCT: 24 % — ABNORMAL LOW (ref 39.0–52.0)
Hemoglobin: 5.9 g/dL — CL (ref 13.0–17.0)
Hemoglobin: 7.7 g/dL — ABNORMAL LOW (ref 13.0–17.0)
MCH: 29.1 pg (ref 26.0–34.0)
MCH: 29.2 pg (ref 26.0–34.0)
MCHC: 30.9 g/dL (ref 30.0–36.0)
MCHC: 32.1 g/dL (ref 30.0–36.0)
MCV: 90.9 fL (ref 80.0–100.0)
MCV: 94.1 fL (ref 80.0–100.0)
Platelets: 200 10*3/uL (ref 150–400)
Platelets: 279 10*3/uL (ref 150–400)
RBC: 2.03 MIL/uL — ABNORMAL LOW (ref 4.22–5.81)
RBC: 2.64 MIL/uL — ABNORMAL LOW (ref 4.22–5.81)
RDW: 13.2 % (ref 11.5–15.5)
RDW: 13.3 % (ref 11.5–15.5)
WBC: 6.8 10*3/uL (ref 4.0–10.5)
WBC: 9.6 10*3/uL (ref 4.0–10.5)
nRBC: 0 % (ref 0.0–0.2)
nRBC: 0 % (ref 0.0–0.2)

## 2022-05-12 LAB — ECHOCARDIOGRAM COMPLETE
AR max vel: 1.59 cm2
AV Area VTI: 1.63 cm2
AV Area mean vel: 1.5 cm2
AV Mean grad: 3 mmHg
AV Peak grad: 5.2 mmHg
Ao pk vel: 1.14 m/s
Area-P 1/2: 3.27 cm2
Calc EF: 37.4 %
S' Lateral: 4.4 cm
Single Plane A2C EF: 33.3 %
Single Plane A4C EF: 40.5 %

## 2022-05-12 LAB — RENAL FUNCTION PANEL
Albumin: 3.2 g/dL — ABNORMAL LOW (ref 3.5–5.0)
Anion gap: 14 (ref 5–15)
BUN: 87 mg/dL — ABNORMAL HIGH (ref 6–20)
CO2: 17 mmol/L — ABNORMAL LOW (ref 22–32)
Calcium: 5.6 mg/dL — CL (ref 8.9–10.3)
Chloride: 109 mmol/L (ref 98–111)
Creatinine, Ser: 9.09 mg/dL — ABNORMAL HIGH (ref 0.61–1.24)
GFR, Estimated: 6 mL/min — ABNORMAL LOW (ref >60)
Glucose, Bld: 93 mg/dL (ref 70–99)
Phosphorus: 7.9 mg/dL — ABNORMAL HIGH (ref 2.5–4.6)
Potassium: 5.1 mmol/L (ref 3.5–5.1)
Sodium: 140 mmol/L (ref 135–145)

## 2022-05-12 LAB — PROTIME-INR
INR: 1.2 (ref 0.8–1.2)
Prothrombin Time: 14.9 seconds (ref 11.4–15.2)

## 2022-05-12 LAB — COMPREHENSIVE METABOLIC PANEL
ALT: 15 U/L (ref 0–44)
AST: 14 U/L — ABNORMAL LOW (ref 15–41)
Albumin: 3.4 g/dL — ABNORMAL LOW (ref 3.5–5.0)
Alkaline Phosphatase: 86 U/L (ref 38–126)
Anion gap: 15 (ref 5–15)
BUN: 74 mg/dL — ABNORMAL HIGH (ref 6–20)
CO2: 17 mmol/L — ABNORMAL LOW (ref 22–32)
Calcium: 5.7 mg/dL — CL (ref 8.9–10.3)
Chloride: 108 mmol/L (ref 98–111)
Creatinine, Ser: 9.01 mg/dL — ABNORMAL HIGH (ref 0.61–1.24)
GFR, Estimated: 6 mL/min — ABNORMAL LOW (ref >60)
Glucose, Bld: 107 mg/dL — ABNORMAL HIGH (ref 70–99)
Potassium: 4.5 mmol/L (ref 3.5–5.1)
Sodium: 140 mmol/L (ref 135–145)
Total Bilirubin: 0.9 mg/dL (ref 0.3–1.2)
Total Protein: 7.8 g/dL (ref 6.5–8.1)

## 2022-05-12 LAB — SODIUM, URINE, RANDOM: Sodium, Ur: 101 mmol/L

## 2022-05-12 LAB — FERRITIN: Ferritin: 244 ng/mL (ref 24–336)

## 2022-05-12 LAB — IRON AND TIBC
Iron: 55 ug/dL (ref 45–182)
Saturation Ratios: 23 % (ref 17.9–39.5)
TIBC: 242 ug/dL — ABNORMAL LOW (ref 250–450)
UIBC: 187 ug/dL

## 2022-05-12 LAB — VITAMIN D 25 HYDROXY (VIT D DEFICIENCY, FRACTURES): Vit D, 25-Hydroxy: 18.2 ng/mL — ABNORMAL LOW (ref 30–100)

## 2022-05-12 LAB — PREPARE RBC (CROSSMATCH)

## 2022-05-12 LAB — HEMOGLOBIN AND HEMATOCRIT, BLOOD
HCT: 21.9 % — ABNORMAL LOW (ref 39.0–52.0)
Hemoglobin: 7.3 g/dL — ABNORMAL LOW (ref 13.0–17.0)

## 2022-05-12 LAB — ABO/RH: ABO/RH(D): O POS

## 2022-05-12 LAB — HEPATITIS B SURFACE ANTIGEN: Hepatitis B Surface Ag: NONREACTIVE

## 2022-05-12 LAB — CREATININE, URINE, RANDOM: Creatinine, Urine: 30 mg/dL

## 2022-05-12 LAB — MAGNESIUM: Magnesium: 2 mg/dL (ref 1.7–2.4)

## 2022-05-12 LAB — TROPONIN I (HIGH SENSITIVITY)
Troponin I (High Sensitivity): 158 ng/L (ref <18)
Troponin I (High Sensitivity): 153 ng/L (ref <18)

## 2022-05-12 LAB — BRAIN NATRIURETIC PEPTIDE: B Natriuretic Peptide: 1590.5 pg/mL — ABNORMAL HIGH (ref 0.0–100.0)

## 2022-05-12 MED ORDER — CALCIUM GLUCONATE-NACL 2-0.675 GM/100ML-% IV SOLN
2.0000 g | Freq: Once | INTRAVENOUS | Status: AC
  Administered 2022-05-12: 2000 mg via INTRAVENOUS
  Filled 2022-05-12: qty 100

## 2022-05-12 MED ORDER — FUROSEMIDE 10 MG/ML IJ SOLN
40.0000 mg | Freq: Two times a day (BID) | INTRAMUSCULAR | Status: DC
  Administered 2022-05-12 – 2022-05-14 (×6): 40 mg via INTRAVENOUS
  Filled 2022-05-12 (×6): qty 4

## 2022-05-12 MED ORDER — HYDRALAZINE HCL 50 MG PO TABS
50.0000 mg | ORAL_TABLET | Freq: Three times a day (TID) | ORAL | Status: DC
  Administered 2022-05-12 – 2022-05-14 (×6): 50 mg via ORAL
  Filled 2022-05-12: qty 2
  Filled 2022-05-12 (×4): qty 1
  Filled 2022-05-12: qty 2

## 2022-05-12 MED ORDER — PERFLUTREN LIPID MICROSPHERE
1.0000 mL | INTRAVENOUS | Status: AC | PRN
  Administered 2022-05-12: 3 mL via INTRAVENOUS

## 2022-05-12 MED ORDER — LORAZEPAM 2 MG/ML IJ SOLN
INTRAMUSCULAR | Status: AC
  Administered 2022-05-12: 1 mg via INTRAVENOUS
  Filled 2022-05-12: qty 1

## 2022-05-12 MED ORDER — ACETAMINOPHEN 650 MG RE SUPP: 650.0000 mg | Freq: Four times a day (QID) | RECTAL | Status: DC | PRN

## 2022-05-12 MED ORDER — ISOSORBIDE MONONITRATE ER 60 MG PO TB24
60.0000 mg | ORAL_TABLET | Freq: Every day | ORAL | Status: DC
  Administered 2022-05-12 – 2022-05-14 (×3): 60 mg via ORAL
  Filled 2022-05-12 (×2): qty 1
  Filled 2022-05-12: qty 2

## 2022-05-12 MED ORDER — CALCITRIOL 0.5 MCG PO CAPS
0.5000 ug | ORAL_CAPSULE | Freq: Every day | ORAL | Status: DC
  Administered 2022-05-12 – 2022-05-16 (×5): 0.5 ug via ORAL
  Filled 2022-05-12 (×5): qty 1

## 2022-05-12 MED ORDER — FUROSEMIDE 10 MG/ML IJ SOLN
80.0000 mg | Freq: Once | INTRAMUSCULAR | Status: AC
  Administered 2022-05-12: 80 mg via INTRAVENOUS
  Filled 2022-05-12: qty 8

## 2022-05-12 MED ORDER — CALCIUM CARBONATE ANTACID 500 MG PO CHEW
800.0000 mg | CHEWABLE_TABLET | Freq: Two times a day (BID) | ORAL | Status: DC
  Administered 2022-05-12 – 2022-05-16 (×9): 800 mg via ORAL
  Filled 2022-05-12 (×9): qty 4

## 2022-05-12 MED ORDER — ACETAMINOPHEN 325 MG PO TABS: 650.0000 mg | ORAL_TABLET | Freq: Four times a day (QID) | ORAL | Status: DC | PRN

## 2022-05-12 MED ORDER — CALCIUM ACETATE (PHOS BINDER) 667 MG PO CAPS
1334.0000 mg | ORAL_CAPSULE | Freq: Three times a day (TID) | ORAL | Status: DC
  Administered 2022-05-12 – 2022-05-16 (×12): 1334 mg via ORAL
  Filled 2022-05-12 (×12): qty 2

## 2022-05-12 MED ORDER — DARBEPOETIN ALFA 60 MCG/0.3ML IJ SOSY
60.0000 ug | PREFILLED_SYRINGE | INTRAMUSCULAR | Status: DC
  Administered 2022-05-13: 60 ug via SUBCUTANEOUS
  Filled 2022-05-12: qty 0.3

## 2022-05-12 MED ORDER — LORAZEPAM 2 MG/ML IJ SOLN: 1.0000 mg | Freq: Once | INTRAMUSCULAR | Status: AC

## 2022-05-12 MED ORDER — SODIUM CHLORIDE 0.9% IV SOLUTION: Freq: Once | INTRAVENOUS | Status: AC

## 2022-05-12 MED ORDER — HYDRALAZINE HCL 20 MG/ML IJ SOLN: 5.0000 mg | Freq: Four times a day (QID) | INTRAMUSCULAR | Status: DC | PRN

## 2022-05-12 MED ORDER — CARVEDILOL 6.25 MG PO TABS
6.2500 mg | ORAL_TABLET | Freq: Two times a day (BID) | ORAL | Status: DC
  Administered 2022-05-12 – 2022-05-16 (×9): 6.25 mg via ORAL
  Filled 2022-05-12 (×6): qty 1
  Filled 2022-05-12: qty 2
  Filled 2022-05-12: qty 1
  Filled 2022-05-12: qty 2

## 2022-05-12 MED ORDER — ROSUVASTATIN CALCIUM 5 MG PO TABS
5.0000 mg | ORAL_TABLET | Freq: Every day | ORAL | Status: DC
  Administered 2022-05-12 – 2022-05-17 (×6): 5 mg via ORAL
  Filled 2022-05-12 (×6): qty 1

## 2022-05-12 MED ORDER — LABETALOL HCL 5 MG/ML IV SOLN
20.0000 mg | Freq: Once | INTRAVENOUS | Status: AC
  Administered 2022-05-12: 20 mg via INTRAVENOUS
  Filled 2022-05-12: qty 4

## 2022-05-12 NOTE — Progress Notes (Signed)
   Patient Status: [facility] - ED  Assessment and Plan: Patient in need dialysis access.  Tunneled catheter requested Patient falling asleep during conversation. Sister [name] provides consent. Plan for placement tomorrow 05/13/22.  Consent in IR  Risks and benefits discussed, not limited to bleeding, infection, vascular injury, pneumothorax which may require chest tube placement, air embolism or even death  All of the sister's questions were answered, and is agreeable to proceed. Consent signed and in IR control room.   ______________________________________________________________________   History of Present Illness: Paul [name] is a 57 y.o. male who presented to ED with epistaxis and malaise.  He has had progressive CKD and it was determined beginning dialysis is now necessary.  Per nephrology, patient agreeable.  Allergies and medications reviewed.   Review of Systems: Unobtainable  Vital Signs: BP 113/76   Pulse 77   Temp 98 F (36.7 C) (Oral)   Resp 14   SpO2 100%   Physical Exam Constitutional:      General: He is not in acute distress.    Appearance: He is ill-appearing.     Comments: Blood hanging Arousable, but quickly falls asleep   HENT:     Head: Normocephalic.     Nose:     Comments: Nasal occlusive device in place, small amount of dried blood visible. Cardiovascular:     Rate and Rhythm: Normal rate.  Pulmonary:     Effort: Pulmonary effort is normal. No respiratory distress.  Abdominal:     General: Abdomen is flat.     Palpations: Abdomen is soft.  Skin:    General: Skin is dry.     Coloration: Skin is pale.  Neurological:     Comments: Unable to maintain conversation as he falls asleep repeatedly.  Is often able to recall details of communication, however.     Imaging reviewed.   Labs:  COAGS: Recent Labs    05/11/22 2342  INR 1.2    BMP: Recent Labs    06/04/21 0040 06/05/21 0057 06/14/21 1350 05/11/22 2342  05/12/22 0636  NA 136 137 138 140 140  K 4.2 4.0 5.1 4.5 5.1  CL 104 107 104 108 109  CO2 20* 21* 18* 17* 17*  GLUCOSE 97 105* 86 107* 93  BUN 69* 68* 52* 74* 87*  CALCIUM 7.4* 6.7* 8.0* 5.7* 5.6*  CREATININE 4.97* 4.59* 3.98* 9.01* 9.09*  GFRNONAA 13* 14*  --  6* 6*       Electronically Signed: Pasty Spillers, PA 05/12/2022, 12:50 PM   I spent a total of 15 minutes in face to face in clinical consultation, greater than 50% of which was counseling/coordinating care for dialysis access.

## 2022-05-12 NOTE — ED Provider Notes (Signed)
[facility] EMERGENCY DEPARTMENT Provider Note   CSN: [id number] Arrival date & time: 05/11/22  2212     History  Chief Complaint  Patient presents with   Hypertension   Epistaxis    Paul [name] is a 57 y.o. male.  The history is provided by the patient.  Hypertension  Epistaxis He has history of hypertension, central retinal artery occlusion, heart failure, bipolar disorder, chronic kidney disease and comes in because of a nosebleed today.  Nosebleed is right-sided.  He has had nosebleeds before, but not this severe.  He denies aspirin use and denies trauma and he is not on any oral anticoagulants.  Of note, he admits that he has not been taking any of his medications.  He does endorse dyspnea on modest exertion which has been stable.  He denies chest pain, heaviness, tightness, pressure.   Home Medications Prior to Admission medications   Medication Sig Start Date End Date Taking? Authorizing Provider  aspirin 81 MG chewable tablet Chew 1 tablet (81 mg total) by mouth daily. 06/07/21   Ladell Pier, MD  carvedilol (COREG) 6.25 MG tablet Take 1 tablet (6.25 mg total) by mouth 2 (two) times daily with a meal. 06/07/21   Ladell Pier, MD  fenofibrate 54 MG tablet Take 1 tablet (54 mg total) by mouth daily. 06/07/21   Ladell Pier, MD  hydrALAZINE (APRESOLINE) 50 MG tablet Take 1 tablet (50 mg total) by mouth every 8 (eight) hours. 06/07/21   Ladell Pier, MD  isosorbide mononitrate (IMDUR) 60 MG 24 hr tablet Take 1 tablet (60 mg total) by mouth daily. 06/07/21   Ladell Pier, MD  rosuvastatin (CRESTOR) 5 MG tablet Take 1 tablet (5 mg total) by mouth daily. 06/07/21   Ladell Pier, MD  torsemide (DEMADEX) 20 MG tablet Take 1 tablet (20 mg total) by mouth daily. 06/07/21   Ladell Pier, MD      Allergies    Penicillins    Review of Systems   Review of Systems  HENT:  Positive for nosebleeds.   All other systems reviewed and are  negative.   Physical Exam Updated Vital Signs BP (!) 178/116 (BP Location: Left Arm)   Pulse (!) 108   Temp 98 F (36.7 C) (Oral)   Resp 14   SpO2 99%  Physical Exam Vitals and nursing note reviewed.   57 year old male, resting comfortably and in no acute distress. Vital signs are significant for elevated blood pressure and heart rate. Oxygen saturation is 99%, which is normal. Head is normocephalic and atraumatic. Right eye is atrophic with scarred cornea.  Left pupil is round and reactive to light.  Extraocular movements are full.  He currently has both nares packed with gauze which are bloodsoaked but no active bleeding. Neck is nontender and supple without adenopathy or JVD. Back is nontender and there is no CVA tenderness. Lungs are clear without rales, wheezes, or rhonchi. Chest is nontender. Heart has regular rate and rhythm without murmur. Abdomen is soft, flat, nontender. Extremities have no cyanosis or edema, full range of motion is present. Skin is warm and dry without rash. Neurologic: Mental status is normal, cranial nerves are intact, moves all extremities equally.  ED Results / Procedures / Treatments   Labs (all labs ordered are listed, but only abnormal results are displayed) Labs Reviewed  CBC - Abnormal; Notable for the following components:      Result Value  RBC 2.64 (*)    Hemoglobin 7.7 (*)    HCT 24.0 (*)    All other components within normal limits  COMPREHENSIVE METABOLIC PANEL - Abnormal; Notable for the following components:   CO2 17 (*)    Glucose, Bld 107 (*)    BUN 74 (*)    Creatinine, Ser 9.01 (*)    Calcium 5.7 (*)    Albumin 3.4 (*)    AST 14 (*)    GFR, Estimated 6 (*)    All other components within normal limits  BRAIN NATRIURETIC PEPTIDE - Abnormal; Notable for the following components:   B Natriuretic Peptide 1,590.5 (*)    All other components within normal limits  TROPONIN I (HIGH SENSITIVITY) - Abnormal; Notable for the  following components:   Troponin I (High Sensitivity) 158 (*)    All other components within normal limits  PROTIME-INR  TROPONIN I (HIGH SENSITIVITY)    EKG None  Radiology DG Chest 2 View  Result Date: 05/11/2022 CLINICAL DATA:  Shortness of breath. EXAM: CHEST - 2 VIEW COMPARISON:  Chest radiograph dated June 03, 2021. FINDINGS: The heart is mildly enlarged. Mild pulmonary vascular congestion. Lungs otherwise clear without evidence of focal consolidation or pleural effusion. Thoracic spondylosis. IMPRESSION: Mild cardiomegaly and pulmonary vascular congestion. Electronically Signed   By: Keane Police D.O.   On: 05/11/2022 23:59    Procedures .Epistaxis Management  Date/Time: 05/12/2022 2:10 AM  Performed by: Delora Fuel, MD Authorized by: Delora Fuel, MD   Consent:    Consent obtained:  Verbal   Consent given by:  Patient   Risks, benefits, and alternatives were discussed: yes     Risks discussed:  Nasal injury and pain   Alternatives discussed:  Alternative treatment Universal protocol:    Procedure explained and questions answered to patient or proxy's satisfaction: yes     Relevant documents present and verified: yes     Test results available: yes     Required blood products, implants, devices, and special equipment available: yes     Site/side marked: yes     Immediately prior to procedure, a time out was called: yes     Patient identity confirmed:  Arm band and verbally with patient Anesthesia:    Anesthesia method:  None Procedure details:    Treatment site:  R anterior   Repair method: RapidRhino 7.5 cm.   Treatment complexity:  Limited   Treatment episode: initial   Post-procedure details:    Assessment:  Bleeding decreased   Procedure completion:  Tolerated well, no immediate complications   Cardiac monitor shows normal sinus rhythm, per my interpretation.  Medications Ordered in ED Medications  furosemide (LASIX) injection 80 mg (has no  administration in time range)  oxymetazoline (AFRIN) 0.05 % nasal spray 1 spray (1 spray Each Nare Given 05/11/22 2308)  labetalol (NORMODYNE) injection 20 mg (20 mg Intravenous Given 05/12/22 0203)  LORazepam (ATIVAN) injection 1 mg (1 mg Intravenous Given 05/12/22 0218)    ED Course/ Medical Decision Making/ A&P                           Medical Decision Making Amount and/or Complexity of Data Reviewed Labs: ordered.  Risk OTC drugs. Prescription drug management. Decision regarding hospitalization.   Epistaxis.  Poorly controlled hypertension with medication noncompliance.  Chest x-ray shows cardiomegaly and pulmonary vascular congestion.  I have independently viewed the images, and agree with radiologist's interpretation.  I have reviewed and interpreted his laboratory tests, and my interpretation is acute kidney injury superimposed on chronic kidney disease, metabolic acidosis secondary to renal failure, elevated random glucose, mildly decreased albumin which is likely secondary to nutritional deficiency, mildly elevated troponin which is unlikely to represent ACS and more likely is related to poor renal clearance, markedly elevated BNP consistent with heart failure.  Creatinine today is 9.01 compared with 3.98 on 06/14/2021.  Normochromic normocytic anemia which is likely combination of anemia of renal failure and possibly some component of acute blood loss.  I have ordered a dose of labetalol for acute blood pressure control.  I have ordered an electrocardiogram.  He will need admission for management of his acute kidney injury and consideration for initiating dialysis.  I have reviewed his past records, and he was admitted on 06/01/2021 for hypoxic respiratory failure from heart failure.  Echocardiogram on 06/02/2021 showed ejection fraction 30-35% with global hypokinesis and grade 2 diastolic dysfunction.  I have reviewed and interpreted his electrocardiogram, and my interpretation is T wave  abnormality in inferior and anterior lateral leads which is somewhat more pronounced than it was on prior electrocardiogram.  I feel this likely represents progression of left ventricular hypertrophy rather than ischemia.  Following lipid, blood pressure has come down considerably.  There has not been a recurrence of his nosebleed with the RapidRhino in place.  I have discussed the case with Dr. Marlowe Sax of [facility], who agrees to admit the patient.  She has requested he receive a dose of furosemide and I have ordered a dose of furosemide intravenously.  CRITICAL CARE Performed by: Delora Fuel Total critical care time: 60 minutes Critical care time was exclusive of separately billable procedures and treating other patients. Critical care was necessary to treat or prevent imminent or life-threatening deterioration. Critical care was time spent personally by me on the following activities: development of treatment plan with patient and/or surrogate as well as nursing, discussions with consultants, evaluation of patient's response to treatment, examination of patient, obtaining history from patient or surrogate, ordering and performing treatments and interventions, ordering and review of laboratory studies, ordering and review of radiographic studies, pulse oximetry and re-evaluation of patient's condition.  Final Clinical Impression(s) / ED Diagnoses Final diagnoses:  Acute renal failure superimposed on chronic kidney disease, unspecified acute renal failure type, unspecified CKD stage (HCC)  Epistaxis  Uncontrolled hypertension  Elevated troponin  Normochromic normocytic anemia  Combined systolic and diastolic congestive heart failure, unspecified HF chronicity ([place])    Rx / DC Orders ED Discharge Orders     None         Delora Fuel, MD 59/16/38 0302

## 2022-05-12 NOTE — Consult Note (Signed)
[name] Admit Date: 05/11/2022 05/12/2022 [name] Requesting Physician:  [name] MD  Reason for Consult:  Progressive renal failure, uremia, hypocalcemia, anemia HPI:  59M PMH including chronic mixed heart failure, hypertension, OSA, chronic schizophrenia, CKD 4 who presented to the ED overnight with epistaxis, malaise.  [facility] follows at [name] with Dr. Joylene [name], last visit May 2023 where his creatinine was 4.68.  At that time he continued to decline/stated he would not receive dialysis in any form and it was recommended that he would transition to hospice when kidney disease advanced and became symptomatic.  The patient had stopped taking all medications for 5 months prior to his presentation last night.  His right nares required packing.  Hemoglobin has declined to 5.9 and transfusion has been ordered.  Blood pressures were quite elevated at presentation requiring parenteral medications and resumption of oral medications, improving now.  Labs are notable for worsened kidney disease with a creatinine of 9.1 and a GFR of 6, BUN 87.  K is 5.1 with serum bicarbonate of 17 and anion gap of 14.  Calcium is 5.6 with an albumin of 3.2, phosphorus elevated at 7.9.  UA without hematuria or pyuria.  Renal ultrasound with no hydronephrosis but findings consistent with medical renal disease.  1 view chest x-ray with pulmonary vascular congestion.  He has malaise, fatigue, anorexia.  No significant edema.  I reviewed with the patient his previous conversations with Dr. Joylene [name] which she did not disagree with.  I suggested that we have reached a point where we need to consider hospice or a transition to start dialysis.  After a fair amount of conversation he would like to try dialysis and see how he does knowing that he can always stop.  He would also like to have palliative medicine involved.  He knows that he will need an HD catheter and vascular access.   Creatinine, Ser (mg/dL)  Date  Value  05/12/2022 9.09 (H)  05/11/2022 9.01 (H)  06/14/2021 3.98 (H)  06/05/2021 4.59 (H)  06/04/2021 4.97 (H)  06/03/2021 4.38 (H)  06/02/2021 4.08 (H)  06/01/2021 4.07 (H)  04/20/2013 1.45 (H)  09/12/2011 1.61 (H)  ] I/Os:  ROS NSAIDS: no use IV Contrast no exposure Balance of 12 systems is negative w/ exceptions as above  PMH  Past Medical History:  Diagnosis Date   Bipolar disorder ([place])    Central retinal artery occlusion    with macular infarction of the right eye. status post posterior vitrectomy and photocoagulation with insertion of a shunt in 2006.   CHF (congestive heart failure) (HCC)    chronic mixed syst/diast. 89/2119 echo: Systolic function was mildly reduced. The estimated ejection fraction was in 45%, global HK, Grade I Diast dysfxn.   Heart failure    Obesity    Osteoarthritis    s/p right hip replacement in 2001   Schizophrenia ([name])    Severe uncontrolled hypertension    PSH  Past Surgical History:  Procedure Laterality Date   JOINT REPLACEMENT     right hip replacement   right eye surgery     for glaucoma   TOTAL HIP ARTHROPLASTY Bilateral 1997, 2001   FH  Family History  Problem Relation Age of Onset   Hypertension Father    Stroke Father    SH  reports that he has quit smoking. He has never used smokeless tobacco. He reports that he does not drink alcohol and does not use drugs. Allergies  Allergies  Allergen Reactions   Penicillins Other (See Comments)    childhood   Home medications Prior to Admission medications   Medication Sig Start Date End Date Taking? Authorizing Provider  aspirin 81 MG chewable tablet Chew 1 tablet (81 mg total) by mouth daily. Patient not taking: Reported on 05/12/2022 06/07/21   Ladell Pier, MD  carvedilol (COREG) 6.25 MG tablet Take 1 tablet (6.25 mg total) by mouth 2 (two) times daily with a meal. Patient not taking: Reported on 05/12/2022 06/07/21   Ladell Pier, MD  fenofibrate 54 MG tablet  Take 1 tablet (54 mg total) by mouth daily. Patient not taking: Reported on 05/12/2022 06/07/21   Ladell Pier, MD  hydrALAZINE (APRESOLINE) 50 MG tablet Take 1 tablet (50 mg total) by mouth every 8 (eight) hours. Patient not taking: Reported on 05/12/2022 06/07/21   Ladell Pier, MD  isosorbide mononitrate (IMDUR) 60 MG 24 hr tablet Take 1 tablet (60 mg total) by mouth daily. Patient not taking: Reported on 05/12/2022 06/07/21   Ladell Pier, MD  rosuvastatin (CRESTOR) 5 MG tablet Take 1 tablet (5 mg total) by mouth daily. Patient not taking: Reported on 05/12/2022 06/07/21   Ladell Pier, MD  torsemide (DEMADEX) 20 MG tablet Take 1 tablet (20 mg total) by mouth daily. Patient not taking: Reported on 05/12/2022 06/07/21   Ladell Pier, MD    Current Medications Scheduled Meds:  carvedilol  6.25 mg Oral BID WC   furosemide  40 mg Intravenous BID   hydrALAZINE  50 mg Oral Q8H   isosorbide mononitrate  60 mg Oral Daily   rosuvastatin  5 mg Oral Daily   Continuous Infusions: PRN Meds:.acetaminophen **OR** acetaminophen, hydrALAZINE, perflutren lipid microspheres (DEFINITY) IV suspension  CBC Recent Labs  Lab 05/11/22 2342 05/12/22 0636  WBC 9.6 6.8  HGB 7.7* 5.9*  HCT 24.0* 19.1*  MCV 90.9 94.1  PLT 279 286   Basic Metabolic Panel Recent Labs  Lab 05/11/22 2342 05/12/22 0636  NA 140 140  K 4.5 5.1  CL 108 109  CO2 17* 17*  GLUCOSE 107* 93  BUN 74* 87*  CREATININE 9.01* 9.09*  CALCIUM 5.7* 5.6*  PHOS  --  7.9*    Physical Exam  Blood pressure 108/75, pulse 79, temperature 98 F (36.7 C), temperature source Oral, resp. rate 12, SpO2 100 %. GEN: Unwell appearing ENT: Nasal packing in right nares EYES: EOMI CV: Regular, no rub PULM: Diminished in bases otherwise CTAB, normal work of breathing ABD: Soft, nontender SKIN: No rashes or lesions EXT: No significant edema NEURO nonfocal, appropriate, conversant,AAO x3    Assessment 37M progressive CKD4 now  uremic and ESRD.  Also with anemia, hypocalcemia, HTN emergency, medication nonadherence, hyperphosphatemia, NAGMA, AoC combinded CHF.  Progressive CKD, now ESRD with uremia Historically patient adamantly opposed to dialysis but now seems willing to try No immediate HD indication, plan to start tomorrow: 2h, 2K, no UF, BFR 200, no heparin Consult [facility] Will need VDS consult for vascular access Can start CLIP tomorrow Will also have palliative medicine see him given his historical equivocation about dialysis or hospice BMM Hypocalcemia, largely ASx: 25 Vit D 18.2 Start C3 0.76mcg/d S/p 2gm Ca Gluc in ED TUMS BID not with Food Hyperphosphatemia: start PhosLo 2qAC Check PTH Anemia: ABLA + AoCKD S/p pRBC 1/7 in ED Add on Fe studies Start ESA today aranesp 55mcg/d Transfuse PRN NAGMA: HD will address, no need for bicarbonate AoC CHF combined:  mild, on RA; UF in time with HD Acute HTN / urgency: PO meds resumed and BPs improved Medication nonadherence: problematic, hopefully will do well with HD Hx/o SZP: rational and engaging here, seems to have full capacity Epistaxis: per [place] Troponin: per [place] As above   [name]  05/12/2022, 10:55 AM

## 2022-05-12 NOTE — Progress Notes (Signed)
     Referral received for [name] :goals of care discussion. Chart reviewed. Patient assessed and is sleeping, easily aroused. I explained the role of palliative medicine and he is initially agreeable to a conversation. He then falls asleep several times without responding to further inquiries about his thoughts and feelings. Asked if I should return later but he remained asleep.  PMT will re-attempt a visit at a later time/date. Detailed note and recommendations to follow once GOC has been completed.   Thank you for your referral and allowing PMT to assist in Mr. Paul [name]'s care.   Dorthy Cooler, [facility] Palliative Medicine Team  Team Phone # [phone]   NO CHARGE

## 2022-05-12 NOTE — ED Notes (Signed)
Critical Calcium 5.6 was called at 0854 and MD was made aware at [facility]

## 2022-05-12 NOTE — H&P (Signed)
History and Physical    Paul [name] YQI:[id number] DOB: 04/06/1966 DOA: 05/11/2022  PCP: Ladell Pier, MD  Patient coming from: Home  Chief Complaint: Nosebleed  HPI: Paul [name] is a 57 y.o. male with medical history significant of chronic combined CHF, hypertension, CKD stage IV, anemia of chronic disease, hyperlipidemia, schizophrenia, right eye blindness presented to the ED with a chief complaint of epistaxis.  He reported not taking any of his home medications and noted to be hypertensive with systolic 387 and diastolic in the 564P.  Labs showing hemoglobin 7.7 (previously 9-10), bicarb 17 (chronically low), anion gap 15, glucose 107, BUN 74, creatinine 9.0 (baseline 4.0), calcium 5.7, albumin 3.4, BNP 1590, troponin 158> 153.  Chest x-ray showing mild cardiomegaly and pulmonary vascular congestion. Medications administered in the ED include IV Lasix 80 mg, IV labetalol 20 mg, IV Ativan 1 mg, and Afrin nasal spray.  Nasal packing placed in right nare for epistaxis.  Patient states he stopped taking all of his home medications 5 months ago.  Yesterday all of a sudden his right nostril started bleeding.  He denies any injuries to his face or nose.  He does not take any blood thinners.  He is reporting ongoing shortness of breath for several months and occasional cough.  Denies chest pain.  Reports adequate urine output.  He is having loose stools but denies any vomiting or abdominal pain.  No other complaints.  Review of Systems:  Review of Systems  All other systems reviewed and are negative.   Past Medical History:  Diagnosis Date   Bipolar disorder ([place])    Central retinal artery occlusion    with macular infarction of the right eye. status post posterior vitrectomy and photocoagulation with insertion of a shunt in 2006.   CHF (congestive heart failure) (HCC)    chronic mixed syst/diast. 32/9518 echo: Systolic function was mildly reduced. The estimated ejection  fraction was in 45%, global HK, Grade I Diast dysfxn.   Heart failure    Obesity    Osteoarthritis    s/p right hip replacement in 2001   Schizophrenia ([place])    Severe uncontrolled hypertension     Past Surgical History:  Procedure Laterality Date   JOINT REPLACEMENT     right hip replacement   right eye surgery     for glaucoma   TOTAL HIP ARTHROPLASTY Bilateral 1997, 2001     reports that he has quit smoking. He has never used smokeless tobacco. He reports that he does not drink alcohol and does not use drugs.  Allergies  Allergen Reactions   Penicillins Other (See Comments)    childhood    Family History  Problem Relation Age of Onset   Hypertension Father    Stroke Father     Prior to Admission medications   Medication Sig Start Date End Date Taking? Authorizing Provider  aspirin 81 MG chewable tablet Chew 1 tablet (81 mg total) by mouth daily. Patient not taking: Reported on 05/12/2022 06/07/21   Ladell Pier, MD  carvedilol (COREG) 6.25 MG tablet Take 1 tablet (6.25 mg total) by mouth 2 (two) times daily with a meal. Patient not taking: Reported on 05/12/2022 06/07/21   Ladell Pier, MD  fenofibrate 54 MG tablet Take 1 tablet (54 mg total) by mouth daily. Patient not taking: Reported on 05/12/2022 06/07/21   Ladell Pier, MD  hydrALAZINE (APRESOLINE) 50 MG tablet Take 1 tablet (50 mg total) by mouth every  8 (eight) hours. Patient not taking: Reported on 05/12/2022 06/07/21   Ladell Pier, MD  isosorbide mononitrate (IMDUR) 60 MG 24 hr tablet Take 1 tablet (60 mg total) by mouth daily. Patient not taking: Reported on 05/12/2022 06/07/21   Ladell Pier, MD  rosuvastatin (CRESTOR) 5 MG tablet Take 1 tablet (5 mg total) by mouth daily. Patient not taking: Reported on 05/12/2022 06/07/21   Ladell Pier, MD  torsemide (DEMADEX) 20 MG tablet Take 1 tablet (20 mg total) by mouth daily. Patient not taking: Reported on 05/12/2022 06/07/21   Ladell Pier, MD     Physical Exam: Vitals:   05/12/22 0056 05/12/22 0150 05/12/22 0211 05/12/22 0352  BP: (!) 178/116 (!) 200/122 (!) 149/75 (!) 157/111  Pulse: (!) 108 (!) 120 89 94  Resp: 14 (!) 22 20 18   Temp:   98 F (36.7 C) 98.1 F (36.7 C)  TempSrc:    Oral  SpO2: 99% 99% 100% 99%    Physical Exam Vitals reviewed.  Constitutional:      General: He is not in acute distress. HENT:     Head: Normocephalic and atraumatic.     Nose:     Comments: Right nostril: Nasal packing in place, no active bleeding. Eyes:     Extraocular Movements: Extraocular movements intact.  Cardiovascular:     Rate and Rhythm: Normal rate and regular rhythm.     Pulses: Normal pulses.  Pulmonary:     Effort: Pulmonary effort is normal. No respiratory distress.     Breath sounds: No wheezing or rales.  Abdominal:     General: Bowel sounds are normal. There is no distension.     Palpations: Abdomen is soft.     Tenderness: There is no abdominal tenderness.  Musculoskeletal:        General: No swelling or tenderness.     Cervical back: Normal range of motion.  Skin:    General: Skin is warm and dry.  Neurological:     General: No focal deficit present.     Mental Status: He is alert and oriented to person, place, and time.     Labs on Admission: I have personally reviewed following labs and imaging studies  CBC: Recent Labs  Lab 05/11/22 2342  WBC 9.6  HGB 7.7*  HCT 24.0*  MCV 90.9  PLT 811   Basic Metabolic Panel: Recent Labs  Lab 05/11/22 2342  NA 140  K 4.5  CL 108  CO2 17*  GLUCOSE 107*  BUN 74*  CREATININE 9.01*  CALCIUM 5.7*   GFR: CrCl cannot be calculated (Unknown ideal weight.). Liver Function Tests: Recent Labs  Lab 05/11/22 2342  AST 14*  ALT 15  ALKPHOS 86  BILITOT 0.9  PROT 7.8  ALBUMIN 3.4*   No results for input(s): "LIPASE", "AMYLASE" in the last 168 hours. No results for input(s): "AMMONIA" in the last 168 hours. Coagulation Profile: Recent Labs  Lab  05/11/22 2342  INR 1.2   Cardiac Enzymes: No results for input(s): "CKTOTAL", "CKMB", "CKMBINDEX", "TROPONINI" in the last 168 hours. BNP (last 3 results) No results for input(s): "PROBNP" in the last 8760 hours. HbA1C: No results for input(s): "HGBA1C" in the last 72 hours. CBG: No results for input(s): "GLUCAP" in the last 168 hours. Lipid Profile: No results for input(s): "CHOL", "HDL", "LDLCALC", "TRIG", "CHOLHDL", "LDLDIRECT" in the last 72 hours. Thyroid Function Tests: No results for input(s): "TSH", "T4TOTAL", "FREET4", "T3FREE", "THYROIDAB" in the last 72  hours. Anemia Panel: No results for input(s): "VITAMINB12", "FOLATE", "FERRITIN", "TIBC", "IRON", "RETICCTPCT" in the last 72 hours. Urine analysis:    Component Value Date/Time   COLORURINE COLORLESS (A) 06/02/2021 0351   APPEARANCEUR CLEAR 06/02/2021 0351   LABSPEC 1.006 06/02/2021 0351   PHURINE 5.0 06/02/2021 0351   GLUCOSEU NEGATIVE 06/02/2021 0351   HGBUR SMALL (A) 06/02/2021 0351   BILIRUBINUR NEGATIVE 06/02/2021 0351   KETONESUR NEGATIVE 06/02/2021 0351   PROTEINUR 100 (A) 06/02/2021 0351   UROBILINOGEN 0.2 04/20/2013 1944   NITRITE NEGATIVE 06/02/2021 0351   LEUKOCYTESUR NEGATIVE 06/02/2021 0351    Radiological Exams on Admission: DG Chest 2 View  Result Date: 05/11/2022 CLINICAL DATA:  Shortness of breath. EXAM: CHEST - 2 VIEW COMPARISON:  Chest radiograph dated June 03, 2021. FINDINGS: The heart is mildly enlarged. Mild pulmonary vascular congestion. Lungs otherwise clear without evidence of focal consolidation or pleural effusion. Thoracic spondylosis. IMPRESSION: Mild cardiomegaly and pulmonary vascular congestion. Electronically Signed   By: Keane Police D.O.   On: 05/11/2022 23:59    EKG: Independently reviewed.  Sinus rhythm.  T wave inversions in lateral leads new since prior tracing from January 2023.  Assessment and Plan  Acute on chronic combined CHF Secondary to noncompliance.  BNP 1590.   Chest x-ray showing pulmonary vascular congestion.  He is not hypoxic. Echo done in January 2023 showing EF 30 to 18%, grade 2 diastolic dysfunction. -IV Lasix 80 mg x 1 given.  Consult nephrology in a.m. given acute renal failure. -Repeat echocardiogram -Monitor intake and output, daily weights -Fluid restriction  AKI on CKD stage IV Metabolic acidosis Creatinine 9.0, baseline around 4.0.  Bicarb 17, chronically low.  Acute kidney injury likely cardiorenal in the setting of acutely decompensated CHF.  In addition, noncompliant with meds and has uncontrolled hypertension. -IV Lasix 80 mg x 1 given for volume overload -Monitor renal function and urine output -Renal ultrasound -Urine sodium and creatinine -Avoid nephrotoxic agents/contrast -Consult nephrology in a.m.  Epistaxis Acute on chronic anemia secondary to blood loss Epistaxis controlled after placement of nasal packing.  He is not taking aspirin.  He has anemia of chronic kidney disease with baseline hemoglobin 9-10, now dropped to 7.7 in the setting of acute blood loss. -Consult ENT in a.m. -Type and screen -Monitor H&H and transfuse PRBCs if hemoglobin less than 7.  Discussed with the patient and he is okay with receiving blood transfusions if needed.  Hypertensive urgency Secondary to noncompliance.  Systolic 841 and diastolic in the 660Y initially.  Received IV labetalol 20 mg and blood pressure has improved.  Systolic currently in the 150s. -Resume home Coreg, hydralazine, and Imdur -IV hydralazine PRN SBP >160  Elevated troponin and acute EKG changes EKG showing T wave inversions in lateral leads which appear new since prior EKG from January 2023.  ACS less likely as troponin stable (158> 153) and patient is not endorsing chest pain.  Troponin elevation likely due to demand ischemia in the setting of decompensated CHF, acute renal failure, volume overload, and uncontrolled hypertension. -Echocardiogram  Severe  hypocalcemia Likely related to CKD. -Replace calcium -Check PTH, vitamin D, and magnesium levels  Hyperlipidemia -Continue Crestor  DVT prophylaxis: SCDs Code Status: Full Code (discussed with the patient) Family Communication: No family available at this time. Level of care: Progressive Care Unit Admission status: It is my clinical opinion that admission to INPATIENT is reasonable and necessary because of the expectation that this patient will require hospital care that crosses  at least 2 midnights to treat this condition based on the medical complexity of the problems presented.  Given the aforementioned information, the predictability of an adverse outcome is felt to be significant.   Shela Leff MD [facility]  If 7PM-7AM, please contact night-coverage [email or web address]  05/12/2022, 4:18 AM

## 2022-05-12 NOTE — Progress Notes (Signed)
  Echocardiogram 2D Echocardiogram has been performed.  Paul [name] 05/12/2022, 10:30 AM

## 2022-05-12 NOTE — Progress Notes (Signed)
PROGRESS NOTE    Paul [name]  SJG:[id number] DOB: November 30, 1965 DOA: 05/11/2022 PCP: Ladell Pier, MD   Brief Narrative:  HPI: Paul [name] is a 57 y.o. male with medical history significant of chronic combined CHF, hypertension, CKD stage IV, anemia of chronic disease, hyperlipidemia, schizophrenia, right eye blindness presented to the ED with a chief complaint of epistaxis.  He reported not taking any of his home medications and noted to be hypertensive with systolic 654 and diastolic in the 650P.  Labs showing hemoglobin 7.7 (previously 9-10), bicarb 17 (chronically low), anion gap 15, glucose 107, BUN 74, creatinine 9.0 (baseline 4.0), calcium 5.7, albumin 3.4, BNP 1590, troponin 158> 153.  Chest x-ray showing mild cardiomegaly and pulmonary vascular congestion. Medications administered in the ED include IV Lasix 80 mg, IV labetalol 20 mg, IV Ativan 1 mg, and Afrin nasal spray.  Nasal packing placed in right nare for epistaxis.   Patient states he stopped taking all of his home medications 5 months ago.  Yesterday all of a sudden his right nostril started bleeding.  He denies any injuries to his face or nose.  He does not take any blood thinners.  He is reporting ongoing shortness of breath for several months and occasional cough.  Denies chest pain.  Reports adequate urine output.  He is having loose stools but denies any vomiting or abdominal pain.  No other complaints.  Assessment & Plan:   Principal Problem:   CHF exacerbation ([place]) Active Problems:   Acute kidney injury superimposed on chronic kidney disease (HCC)   Elevated troponin   Metabolic acidosis   Epistaxis   Acute on chronic blood loss anemia   Hypertensive urgency   Hypocalcemia  Acute on chronic combined systolic and diastolic CHF: Secondary to noncompliance.  BNP 1590.  His baseline BNP appears to be around 600.  Chest x-ray showing pulmonary vascular congestion.  He is not hypoxic. Echo done in January  2023 showing EF 30 to 54%, grade 2 diastolic dysfunction.  Repeat echo is pending.  He received 1 dose of IV Lasix in the ED.  Does not have crackles but I will start him on Lasix IV 40 mg twice daily.  Strict I's and O's and low-sodium diet.   AKI on CKD stage IV Metabolic acidosis/hypocalcemia baseline around 4.0.  Presented with creatinine of 9.0, bicarb 17, chronically low.  Acute kidney injury likely cardiorenal in the setting of acutely decompensated CHF.  In addition, noncompliant with meds and has uncontrolled hypertension.  Renal ultrasound unremarkable.  Continue to avoid nephrotoxic agents, I have consulted nephrology.  Will defer to them for the management of this.   Epistaxis Acute on chronic anemia secondary to blood loss Epistaxis controlled after placement of nasal packing.  He is not taking aspirin.  He has anemia of chronic kidney disease with baseline hemoglobin 9-10, now dropped to 7.7 in the setting of acute blood loss and further drop to 5.9 this morning.  Epistasis likely in the setting of hypertensive emergency.  His bleeding has stopped so no need for ENT consultation.  We will transfuse him 1 unit of PRBC, he has consented for that.  Will repeat H&H posttransfusion and transfuse if less than 7.  Low threshold to consult ENT if he rebleeds.   Hypertensive emergency Secondary to noncompliance.  He meets criteria for hypertensive emergency based on acute renal failure and epistaxis in the setting of elevated blood pressure.  Blood pressure is still elevated although better  than yesterday.  All his home medications including Coreg, hydralazine and Imdur has been resumed.    Elevated troponin and acute EKG changes EKG showing T wave inversions in lateral leads which appear new since prior EKG from January 2023.  ACS less likely as troponin stable (158> 153) and patient is not endorsing chest pain.  Troponin elevation likely due to demand ischemia in the setting of decompensated  CHF, acute renal failure, volume overload, and uncontrolled hypertension.  Echo pending.   Hyperlipidemia -Continue Crestor    DVT prophylaxis: SCDs Start: 05/12/22 0520   Code Status: Full Code  Family Communication:  None present at bedside.  Plan of care discussed with patient in length and he/she verbalized understanding and agreed with it.  Status is: Inpatient Remains inpatient appropriate because: Patient very sick with multiple medical problems as mentioned above.   Estimated body mass index is 32.56 kg/m as calculated from the following:   Height as of 06/07/21: 5\' 3"  (1.6 m).   Weight as of 06/07/21: 83.4 kg.    Nutritional Assessment: There is no height or weight on file to calculate BMI.. Seen by dietician.  I agree with the assessment and plan as outlined below: Nutrition Status:        . Skin Assessment: I have examined the patient's skin and I agree with the wound assessment as performed by the wound care RN as outlined below:    Consultants:  Nephrology  Procedures:  None  Antimicrobials:  Anti-infectives (From admission, onward)    None         Subjective: Patient seen and examined the ED.  He is fully alert and oriented.  He says that he is uncomfortable due to nasal packing but otherwise he denied any chest pain or shortness of breath.  He says that he stopped taking his medications 5 months ago just because of being lazy and now he understands and admits to trying to remain compliant.  Objective: Vitals:   05/12/22 0352 05/12/22 0644 05/12/22 1009 05/12/22 1025  BP: (!) 157/111 (!) 165/122 112/71   Pulse: 94 95 85   Resp: 18 18 14    Temp: 98.1 F (36.7 C) 98 F (36.7 C) 98 F (36.7 C)   TempSrc: Oral Oral Oral   SpO2: 99% 100% 100% 100%    Intake/Output Summary (Last 24 hours) at 05/12/2022 1029 Last data filed at 05/12/2022 0715 Gross per 24 hour  Intake --  Output 800 ml  Net -800 ml   There were no vitals filed for this  visit.  Examination:  General exam: Appears calm and comfortable  Respiratory system: Clear to auscultation. Respiratory effort normal. Cardiovascular system: S1 & S2 heard, RRR. No JVD, murmurs, rubs, gallops or clicks. No pedal edema. Gastrointestinal system: Abdomen is nondistended, soft and nontender. No organomegaly or masses felt. Normal bowel sounds heard. Central nervous system: Alert and oriented. No focal neurological deficits. Extremities: Symmetric 5 x 5 power. Skin: No rashes, lesions or ulcers  Data Reviewed: I have personally reviewed following labs and imaging studies  CBC: Recent Labs  Lab 05/11/22 2342 05/12/22 0636  WBC 9.6 6.8  HGB 7.7* 5.9*  HCT 24.0* 19.1*  MCV 90.9 94.1  PLT 279 093   Basic Metabolic Panel: Recent Labs  Lab 05/11/22 2342 05/12/22 0636  NA 140 140  K 4.5 5.1  CL 108 109  CO2 17* 17*  GLUCOSE 107* 93  BUN 74* 87*  CREATININE 9.01* 9.09*  CALCIUM 5.7* 5.6*  MG  --  2.0  PHOS  --  7.9*   GFR: CrCl cannot be calculated (Unknown ideal weight.). Liver Function Tests: Recent Labs  Lab 05/11/22 2342 05/12/22 0636  AST 14*  --   ALT 15  --   ALKPHOS 86  --   BILITOT 0.9  --   PROT 7.8  --   ALBUMIN 3.4* 3.2*   No results for input(s): "LIPASE", "AMYLASE" in the last 168 hours. No results for input(s): "AMMONIA" in the last 168 hours. Coagulation Profile: Recent Labs  Lab 05/11/22 2342  INR 1.2   Cardiac Enzymes: No results for input(s): "CKTOTAL", "CKMB", "CKMBINDEX", "TROPONINI" in the last 168 hours. BNP (last 3 results) No results for input(s): "PROBNP" in the last 8760 hours. HbA1C: No results for input(s): "HGBA1C" in the last 72 hours. CBG: No results for input(s): "GLUCAP" in the last 168 hours. Lipid Profile: No results for input(s): "CHOL", "HDL", "LDLCALC", "TRIG", "CHOLHDL", "LDLDIRECT" in the last 72 hours. Thyroid Function Tests: No results for input(s): "TSH", "T4TOTAL", "FREET4", "T3FREE",  "THYROIDAB" in the last 72 hours. Anemia Panel: No results for input(s): "VITAMINB12", "FOLATE", "FERRITIN", "TIBC", "IRON", "RETICCTPCT" in the last 72 hours. Sepsis Labs: No results for input(s): "PROCALCITON", "LATICACIDVEN" in the last 168 hours.  No results found for this or any previous visit (from the past 240 hour(s)).   Radiology Studies: US RENAL  Result Date: 05/12/2022 CLINICAL DATA:  Acute kidney injury. EXAM: RENAL / URINARY TRACT ULTRASOUND COMPLETE COMPARISON:  06/02/2021 FINDINGS: Right Kidney: Renal measurements: 8.9 x 4.9 x 4.1 cm = volume: 93 mL. Increased cortical echogenicity without hydronephrosis. Left Kidney: Renal measurements: 8.7 x 4.2 x 4.0 cm = volume: 76 mL. Increased echogenicity without hydronephrosis. 12 mm lower pole cyst again identified. Bladder: Could not be visualized secondary to patient positioning. Other: None. IMPRESSION: 1. No hydronephrosis. 2. Increased cortical echogenicity in both kidneys consistent with chronic medical renal disease. 3. Bladder could not be visualized secondary to patient positioning. Electronically Signed   By: Misty Stanley M.D.   On: 05/12/2022 06:50   DG Chest 2 View  Result Date: 05/11/2022 CLINICAL DATA:  Shortness of breath. EXAM: CHEST - 2 VIEW COMPARISON:  Chest radiograph dated June 03, 2021. FINDINGS: The heart is mildly enlarged. Mild pulmonary vascular congestion. Lungs otherwise clear without evidence of focal consolidation or pleural effusion. Thoracic spondylosis. IMPRESSION: Mild cardiomegaly and pulmonary vascular congestion. Electronically Signed   By: Keane Police D.O.   On: 05/11/2022 23:59    Scheduled Meds:  carvedilol  6.25 mg Oral BID WC   furosemide  40 mg Intravenous BID   hydrALAZINE  50 mg Oral Q8H   isosorbide mononitrate  60 mg Oral Daily   rosuvastatin  5 mg Oral Daily   Continuous Infusions:   LOS: 0 days   Darliss Cheney, MD [facility]  05/12/2022, 10:29 AM   *Please note that  this is a verbal dictation therefore any spelling or grammatical errors are due to the "[employer] [employer]" system interpretation.  Please page via [place] and do not message via secure chat for urgent patient care matters. Secure chat can be used for non urgent patient care matters.  How to contact the [facility] Attending or Consulting provider [place] or covering provider during after hours [place], for this patient?  Check the care team in [facility] and look for a) attending/consulting [facility] provider listed and b) the [facility] team listed. Page or secure chat [phone]. Log  into [email or web address] and use [place]'s universal password to access. If you do not have the password, please contact the hospital operator. Locate the [facility] provider you are looking for under [facility] and page to a number that you can be directly reached. If you still have difficulty reaching the provider, please page the [facility] (Director on Call) for the Hospitalists listed on amion for assistance.

## 2022-05-13 ENCOUNTER — Inpatient Hospital Stay (HOSPITAL_COMMUNITY): Payer: Medicaid Other

## 2022-05-13 DIAGNOSIS — E872 Acidosis, unspecified: Secondary | ICD-10-CM

## 2022-05-13 DIAGNOSIS — I160 Hypertensive urgency: Secondary | ICD-10-CM

## 2022-05-13 DIAGNOSIS — N179 Acute kidney failure, unspecified: Secondary | ICD-10-CM | POA: Diagnosis not present

## 2022-05-13 DIAGNOSIS — R040 Epistaxis: Secondary | ICD-10-CM

## 2022-05-13 DIAGNOSIS — I5043 Acute on chronic combined systolic (congestive) and diastolic (congestive) heart failure: Secondary | ICD-10-CM | POA: Diagnosis not present

## 2022-05-13 DIAGNOSIS — I5040 Unspecified combined systolic (congestive) and diastolic (congestive) heart failure: Secondary | ICD-10-CM

## 2022-05-13 DIAGNOSIS — Z7189 Other specified counseling: Secondary | ICD-10-CM | POA: Diagnosis not present

## 2022-05-13 HISTORY — PX: IR US GUIDE VASC ACCESS RIGHT: IMG2390

## 2022-05-13 HISTORY — PX: IR FLUORO GUIDE CV LINE RIGHT: IMG2283

## 2022-05-13 LAB — BPAM RBC
Blood Product Expiration Date: 202402012359
ISSUE DATE / TIME: 202401071000
Unit Type and Rh: 5100

## 2022-05-13 LAB — CBC WITH DIFFERENTIAL/PLATELET
Abs Immature Granulocytes: 0.04 10*3/uL (ref 0.00–0.07)
Basophils Absolute: 0.1 10*3/uL (ref 0.0–0.1)
Basophils Relative: 1 %
Eosinophils Absolute: 0.1 10*3/uL (ref 0.0–0.5)
Eosinophils Relative: 1 %
HCT: 21.4 % — ABNORMAL LOW (ref 39.0–52.0)
Hemoglobin: 7.3 g/dL — ABNORMAL LOW (ref 13.0–17.0)
Immature Granulocytes: 0 %
Lymphocytes Relative: 7 %
Lymphs Abs: 0.7 10*3/uL (ref 0.7–4.0)
MCH: 29.9 pg (ref 26.0–34.0)
MCHC: 34.1 g/dL (ref 30.0–36.0)
MCV: 87.7 fL (ref 80.0–100.0)
Monocytes Absolute: 0.8 10*3/uL (ref 0.1–1.0)
Monocytes Relative: 8 %
Neutro Abs: 9.2 10*3/uL — ABNORMAL HIGH (ref 1.7–7.7)
Neutrophils Relative %: 83 %
Platelets: 234 10*3/uL (ref 150–400)
RBC: 2.44 MIL/uL — ABNORMAL LOW (ref 4.22–5.81)
RDW: 13.2 % (ref 11.5–15.5)
WBC: 11 10*3/uL — ABNORMAL HIGH (ref 4.0–10.5)
nRBC: 0 % (ref 0.0–0.2)

## 2022-05-13 LAB — BASIC METABOLIC PANEL
Anion gap: 13 (ref 5–15)
BUN: 89 mg/dL — ABNORMAL HIGH (ref 6–20)
CO2: 18 mmol/L — ABNORMAL LOW (ref 22–32)
Calcium: 6.4 mg/dL — CL (ref 8.9–10.3)
Chloride: 107 mmol/L (ref 98–111)
Creatinine, Ser: 9.46 mg/dL — ABNORMAL HIGH (ref 0.61–1.24)
GFR, Estimated: 6 mL/min — ABNORMAL LOW (ref >60)
Glucose, Bld: 107 mg/dL — ABNORMAL HIGH (ref 70–99)
Potassium: 3.8 mmol/L (ref 3.5–5.1)
Sodium: 138 mmol/L (ref 135–145)

## 2022-05-13 LAB — TYPE AND SCREEN
ABO/RH(D): O POS
Antibody Screen: NEGATIVE
Unit division: 0

## 2022-05-13 MED ORDER — ALTEPLASE 2 MG IJ SOLR
2.0000 mg | Freq: Once | INTRAMUSCULAR | Status: DC | PRN
  Filled 2022-05-13: qty 2

## 2022-05-13 MED ORDER — MIDAZOLAM HCL 2 MG/2ML IJ SOLN
INTRAMUSCULAR | Status: AC | PRN
  Administered 2022-05-13: 1 mg via INTRAVENOUS

## 2022-05-13 MED ORDER — MIDAZOLAM HCL 2 MG/2ML IJ SOLN
INTRAMUSCULAR | Status: AC
  Filled 2022-05-13: qty 2

## 2022-05-13 MED ORDER — FENTANYL CITRATE (PF) 100 MCG/2ML IJ SOLN
INTRAMUSCULAR | Status: AC
  Filled 2022-05-13: qty 2

## 2022-05-13 MED ORDER — HEPARIN SODIUM (PORCINE) 1000 UNIT/ML IJ SOLN
INTRAMUSCULAR | Status: AC
  Administered 2022-05-13: 3200 [IU]
  Filled 2022-05-13: qty 10

## 2022-05-13 MED ORDER — HEPARIN SODIUM (PORCINE) 1000 UNIT/ML DIALYSIS
1000.0000 [IU] | INTRAMUSCULAR | Status: DC | PRN
  Filled 2022-05-13: qty 1

## 2022-05-13 MED ORDER — LIDOCAINE HCL 1 % IJ SOLN
INTRAMUSCULAR | Status: AC
  Administered 2022-05-13: 20 mL
  Filled 2022-05-13: qty 20

## 2022-05-13 MED ORDER — VITAMIN D (ERGOCALCIFEROL) 1.25 MG (50000 UNIT) PO CAPS
50000.0000 [IU] | ORAL_CAPSULE | ORAL | Status: DC
  Administered 2022-05-13: 50000 [IU] via ORAL
  Filled 2022-05-13: qty 1

## 2022-05-13 MED ORDER — HEPARIN SODIUM (PORCINE) 1000 UNIT/ML IJ SOLN
INTRAMUSCULAR | Status: AC
  Administered 2022-05-13: 3200 [IU]
  Filled 2022-05-13: qty 4

## 2022-05-13 MED ORDER — FENTANYL CITRATE (PF) 100 MCG/2ML IJ SOLN
INTRAMUSCULAR | Status: AC | PRN
  Administered 2022-05-13: 50 ug via INTRAVENOUS

## 2022-05-13 MED ORDER — HEPARIN SODIUM (PORCINE) 1000 UNIT/ML DIALYSIS: 40.0000 [IU]/kg | INTRAMUSCULAR | Status: DC | PRN

## 2022-05-13 MED ORDER — ANTICOAGULANT SODIUM CITRATE 4% (200MG/5ML) IV SOLN
5.0000 mL | Status: DC | PRN
  Filled 2022-05-13: qty 5

## 2022-05-13 MED ORDER — MELATONIN 5 MG PO TABS
10.0000 mg | ORAL_TABLET | Freq: Every evening | ORAL | Status: DC | PRN
  Administered 2022-05-13 – 2022-05-16 (×3): 10 mg via ORAL
  Filled 2022-05-13 (×3): qty 2

## 2022-05-13 MED ORDER — VANCOMYCIN HCL IN DEXTROSE 1-5 GM/200ML-% IV SOLN
INTRAVENOUS | Status: AC
  Administered 2022-05-13: 1000 mg via INTRAVENOUS
  Filled 2022-05-13: qty 200

## 2022-05-13 MED ORDER — CHLORHEXIDINE GLUCONATE CLOTH 2 % EX PADS
6.0000 | MEDICATED_PAD | Freq: Every day | CUTANEOUS | Status: DC
  Administered 2022-05-13 – 2022-05-17 (×5): 6 via TOPICAL

## 2022-05-13 MED ORDER — RENA-VITE PO TABS
1.0000 | ORAL_TABLET | Freq: Every day | ORAL | Status: DC
  Administered 2022-05-13 – 2022-05-16 (×4): 1 via ORAL
  Filled 2022-05-13 (×5): qty 1

## 2022-05-13 MED ORDER — VANCOMYCIN HCL IN DEXTROSE 1-5 GM/200ML-% IV SOLN: 1000.0000 mg | Freq: Once | INTRAVENOUS | Status: AC

## 2022-05-13 NOTE — Plan of Care (Signed)
Pt had dialysis catheter placed in right IJ. Pt went to dialysis this afternoon.  Problem: Education: Goal: Ability to demonstrate management of disease process will improve Outcome: Progressing Goal: Ability to verbalize understanding of medication therapies will improve Outcome: Progressing Goal: Individualized Educational Video(s) Outcome: Progressing   Problem: Activity: Goal: Capacity to carry out activities will improve Outcome: Progressing   Problem: Cardiac: Goal: Ability to achieve and maintain adequate cardiopulmonary perfusion will improve Outcome: Progressing   Problem: Education: Goal: Knowledge of General Education information will improve Description: Including pain rating scale, medication(s)/side effects and non-pharmacologic comfort measures Outcome: Progressing   Problem: Health Behavior/Discharge Planning: Goal: Ability to manage health-related needs will improve Outcome: Progressing   Problem: Clinical Measurements: Goal: Ability to maintain clinical measurements within normal limits will improve Outcome: Progressing Goal: Will remain free from infection Outcome: Progressing Goal: Diagnostic test results will improve Outcome: Progressing Goal: Respiratory complications will improve Outcome: Progressing Goal: Cardiovascular complication will be avoided Outcome: Progressing   Problem: Activity: Goal: Risk for activity intolerance will decrease Outcome: Progressing   Problem: Nutrition: Goal: Adequate nutrition will be maintained Outcome: Progressing   Problem: Coping: Goal: Level of anxiety will decrease Outcome: Progressing   Problem: Elimination: Goal: Will not experience complications related to bowel motility Outcome: Progressing Goal: Will not experience complications related to urinary retention Outcome: Progressing   Problem: Pain Managment: Goal: General experience of comfort will improve Outcome: Progressing   Problem:  Safety: Goal: Ability to remain free from injury will improve Outcome: Progressing   Problem: Skin Integrity: Goal: Risk for impaired skin integrity will decrease Outcome: Progressing

## 2022-05-13 NOTE — Progress Notes (Signed)
Heart Failure Navigator Progress Note  Assessed for Heart & Vascular TOC clinic readiness.  Patient does not meet criteria due to per notes patient Hospice vs Hemodialysis.   Navigator will sign off at this time.   Earnestine Leys, BSN, Clinical cytogeneticist Only

## 2022-05-13 NOTE — Progress Notes (Signed)
Received patient in bed to unit.  Alert and oriented.  Informed consent signed and in chart.   Treatment initiated: 1420 Treatment completed: 1638  Patient tolerated well.  Transported back to the room  Alert, without acute distress.  Hand-off given to patient's nurse.   Access used: catheter Access issues: n/a  Total UF removed: 0 Medication(s) given: n/a Post HD weight: 76.6kg     05/13/22 1638  Vitals  Temp 97.9 F (36.6 C)  Temp Source Axillary  BP 133/79  MAP (mmHg) 91  BP Location Left Arm  BP Method Automatic  Patient Position (if appropriate) Lying  Pulse Rate Source Monitor  Oxygen Therapy  O2 Device Room Air  During Treatment Monitoring  Blood Flow Rate (mL/min) 200 mL/min  Arterial Pressure (mmHg) -60 mmHg  Venous Pressure (mmHg) 70 mmHg  TMP (mmHg) 30 mmHg  Ultrafiltration Rate (mL/min) 501 mL/min  Dialysate Flow Rate (mL/min) 300 ml/min  HD Safety Checks Performed Yes  Intra-Hemodialysis Comments Tx completed  Post Treatment  Duration of HD Treatment -hour(s) 2 hour(s)  Liters Processed 23.9  Fluid Removed (mL) 0 mL  Tolerated HD Treatment Yes         [facility]

## 2022-05-13 NOTE — Consult Note (Signed)
Consultation Note Date: 05/13/2022   Patient Name: Paul [name]  DOB: [birth year]  MRN: [id number]  Age / Sex: 57 y.o., male  PCP: Ladell Pier, MD Referring Physician: Darliss Cheney, MD  Reason for Consultation: Establishing goals of care  HPI/Patient Profile: 57 y.o. male  with past medical history of  chronic combined CHF, hypertension, CKD stage IV, anemia of chronic disease, hyperlipidemia, schizophrenia, right eye blindness admitted on 05/11/2022 with epistaxis.   Patient states he stopped taking all of his home medications 5 months ago. He is admitted for acute on chronic CHF, with echo showing improvement of EF from 30-35% to , AKI on CKD stage IV, acute on chronic anemia secondary to blood loss, hypertensive urgency, severe hypocalcemia.  He has agreed to HD, although has been very opposed in the past (as recently as May 2023). PMT has been consulted to assist with goals of care conversation.  Clinical Assessment and Goals of Care:  I have reviewed medical records including EPIC notes, labs and imaging, assessed the patient and met to discuss diagnosis prognosis, GOC, EOL wishes, disposition and options. Attempted to contact sister but unsuccessful.  I introduced Palliative Medicine as specialized medical care for people living with serious illness. It focuses on providing relief from the symptoms and stress of a serious illness. The goal is to improve quality of life for both the patient and the family. I attempted to elicit values and goals of care important to the patient.    Discussion: Patient is a little more alert than yesterday. I reintroduced palliative medicine and my role in assisting with goals of care discussions. He asks "to talk about what?" I created space and opportunity for his thoughts and feelings surrounding HD later today, acknowledging that it must be difficult for him to  re-consider his previous feelings. He states that he is feeling good about his decision, adding that his only concern is the time commitment required with continuing HD. He is not sure he wants to spend that much time in a dialysis center chair. Explored whether he has any other concerns and he denies any at this time. He plans to "see how it goes" and make further decisions based on his experience with HD. He seems like he wishes to go back to sleep and I mentioned that his sister was telling nephrology about her desire to participate in [place] discussions for support. He is agreeable to this PA reaching out to her today. Unfortunately, I was unable to reach her.   Discussed the importance of continued conversation with family and the medical providers regarding overall plan of care and treatment options, ensuring decisions are within the context of the patient's values and GOCs.   Questions and concerns were addressed. The family was encouraged to call with questions or concerns.  PMT will continue to support holistically.   SUMMARY OF RECOMMENDATIONS   -Continue full scope treatment and trial of HD -Patient's primary concern is the time commitment with long-term iHD, he will make step-wise decisions based on how HD goes in the hospital -Attempted to contact patient's sister with patient's request but was unable to reach her and VM box was not set up -PMT will see again and re-attempt contacting family on 1/12. Please call team line with urgent needs in the interim   Prognosis:  Unable to determine  Discharge Planning: To Be Determined      Primary Diagnoses: Present on Admission:  Acute kidney injury superimposed on chronic kidney disease (  [place])  Elevated troponin   Physical Exam Vitals and nursing note reviewed.  Constitutional:      General: He is sleeping. He is not in acute distress.    Interventions: Nasal cannula in place.     Comments: 2L  Cardiovascular:     Rate and Rhythm:  Tachycardia present.  Pulmonary:     Effort: Pulmonary effort is normal.  Neurological:     Mental Status: He is easily aroused.  Psychiatric:        Mood and Affect: Mood normal.        Speech: Speech normal.        Behavior: Behavior normal.   Vital Signs: BP 116/80 (BP Location: Left Arm)   Pulse (!) 107   Temp 98.4 F (36.9 C) (Oral)   Resp 18   Wt 76.6 kg   SpO2 98%   BMI 29.91 kg/m  Pain Scale: 0-10   Pain Score: 0-[name] pain   SpO2: SpO2: 98 % O2 Device:SpO2: 98 % O2 Flow Rate: .O2 Flow Rate (L/min): 2 L/min   Palliative Assessment/Data: TBD     MDM: high    Ruthia Person Johnnette Litter, PA-C  Palliative Medicine Team Team phone # [phone]  Thank you for allowing the Palliative Medicine Team to assist in the care of this patient. Please utilize secure chat with additional questions, if there is [name] response within 30 minutes please call the above phone number.  Palliative Medicine Team providers are available by phone from 7am to 7pm daily and can be reached through the team cell phone.  Should this patient require assistance outside of these hours, please call the patient's attending physician.

## 2022-05-13 NOTE — TOC Progression Note (Signed)
Transition of Care [facility]) - Progression Note    Patient Details  Name: Paul [name] MRN: [id number] Date of Birth: 1965/11/16  [facility]) CM/SW Contact  Zenon Mayo, RN Phone Number: 05/13/2022, 8:48 PM  Clinical Narrative:    received HD cath, orders for dialysis,on room air, indep, visually impaired, renal navigator clipping for OP HD.  Needs transportation ast.  TOC following.        Expected Discharge Plan and Services                                               Social Determinants of Health (SDOH) Interventions SDOH Screenings   Food Insecurity: No Food Insecurity (05/12/2022)  Housing: Low Risk  (05/12/2022)  Transportation Needs: No Transportation Needs (05/12/2022)  Utilities: Not At Risk (05/12/2022)  Depression (PHQ2-9): Low Risk  (06/07/2021)  Tobacco Use: Medium Risk (05/13/2022)    Readmission Risk Interventions    06/05/2021   10:09 AM  Readmission Risk Prevention Plan  Post Dischage Appt Complete  Medication Screening Complete  Transportation Screening Complete

## 2022-05-13 NOTE — Progress Notes (Signed)
Requested to see pt for out-pt HD needs at d/c. Met with pt at bedside. Introduced self and explained role. Pt resides in [place] and prefers a clinic close to his home. Referral made to [facility] admissions today. Pt does not drive and states will need assistance with transportation to HD appts at d/c. Contacted RN CM and CSW with pt's request for assistance with transportation. Pt is visually impaired and does not drive. Pt does not have any family local. Pt's sister lives in [place]. Will assist as needed.   Melven Sartorius Renal Navigator [phone]

## 2022-05-13 NOTE — Progress Notes (Signed)
Patient ID: Paul [name], male   DOB: Jul 25, 1965, 57 y.o.   MRN: [id number] [facility] Progress Note   Assessment/ Plan:   1.  End-stage renal disease with progressive chronic kidney disease and presentation uremic symptoms.  Previously with refusal of dialysis however agreeable to this at the current time for which [facility] placement was undertaken earlier today by IR and orders are in place to begin hemodialysis.  Will begin outpatient dialysis unit placement process.  Based on previous discussion with Dr. Joelyn Oms, likely to pursue permanent access as an outpatient if willing to continue dialysis long-term or will transition to palliative/comfort care measures.  I will confirm this with the patient over the next 24 to 48 hours as his mental status improves. 2.  Acute exacerbation of combined congestive heart failure: Mild and anticipate further improvement with volume unloading on hemodialysis. 3.  Anemia: Secondary to chronic kidney disease/chronic illness.  Denies overt blood loss.  On ESA and with mild iron deficiency for which I will start him on intravenous iron. 4.  Secondary hyperparathyroidism: With elevated phosphorus levels noted, monitor with hemodialysis and begin renal diet.  Started previously on calcium acetate for phosphorus binding. 5.  Hypertension: Poor adherence to oral and hypertensive therapy prior to admission.  Begin hemodialysis with ultrafiltration and revisit antihypertensive therapy.  Subjective:   Earlier underwent placement of right IJ TDC to begin trial of dialysis.   Objective:   BP (!) 150/87   Pulse (!) 106   Temp 98.4 F (36.9 C) (Oral)   Resp 18   Wt 77.1 kg   SpO2 99%   BMI 30.10 kg/m   Intake/Output Summary (Last 24 hours) at 05/13/2022 1142 Last data filed at 05/13/2022 0930 Gross per 24 hour  Intake 1375 ml  Output 2450 ml  Net -1075 ml   Weight change:   Physical Exam: Gen: Somnolent/difficult to arouse, resting in bed after [facility]  placement CVS: Pulse regular tachycardia, S1 and S2 normal Resp: Diminished breath sounds over bases-poor inspiratory effort, no distinct rales or rhonchi.  Right IJ TDC with intact dressing. Abd: Soft, flat, nontender, bowel sounds normal Ext: No lower extremity edema palpable  Imaging: IR Fluoro Guide CV Line Right  Result Date: 05/13/2022 CLINICAL DATA:  End-stage renal disease and need for tunneled hemodialysis catheter placement. EXAM: TUNNELED CENTRAL VENOUS HEMODIALYSIS CATHETER PLACEMENT WITH ULTRASOUND AND FLUOROSCOPIC GUIDANCE ANESTHESIA/SEDATION: Moderate (conscious) sedation was employed during this procedure. A total of Versed 1.0 mg and Fentanyl 50 mcg was administered intravenously by radiology nursing. Moderate Sedation Time: 14 minutes. The patient's level of consciousness and vital signs were monitored continuously by radiology nursing throughout the procedure under my direct supervision. MEDICATIONS: 1 g IV vancomycin. FLUOROSCOPY: 18 seconds.  2.0 mGy. PROCEDURE: The procedure, risks, benefits, and alternatives were explained to the patient. Questions regarding the procedure were encouraged and answered. The patient understands and consents to the procedure. A timeout was performed prior to initiating the procedure. The right neck and chest were prepped with chlorhexidine in a sterile fashion, and a sterile drape was applied covering the operative field. Maximum barrier sterile technique with sterile gowns and gloves were used for the procedure. Local anesthesia was provided with 1% lidocaine. Ultrasound was used to confirm patency of the right internal jugular vein. A permanent ultrasound image was recorded. After creating a small venotomy incision, a 21 gauge needle was advanced into the right internal jugular vein under direct, real-time ultrasound guidance. Ultrasound image documentation was performed.  After securing guidewire access, an 8 Fr dilator was placed. A J-wire was kinked to  measure appropriate catheter length. A Palindrome tunneled hemodialysis catheter measuring 19 cm from tip to cuff was chosen for placement. This was tunneled in a retrograde fashion from the chest wall to the venotomy incision. At the venotomy, serial dilatation was performed and a 15 Fr peel-away sheath was placed over a guidewire. The catheter was then placed through the sheath and the sheath removed. Final catheter positioning was confirmed and documented with a fluoroscopic spot image. The catheter was aspirated, flushed with saline, and injected with appropriate volume heparin dwells. The venotomy incision was closed with subcuticular 4-0 Vicryl. Dermabond was applied to the incision. The catheter exit site was secured with 0-Prolene retention sutures. COMPLICATIONS: None.  No pneumothorax. FINDINGS: After catheter placement, the tip lies in the right atrium. The catheter aspirates normally and is ready for immediate use. IMPRESSION: Placement of tunneled hemodialysis catheter via the right internal jugular vein. The catheter tip lies in the right atrium. The catheter is ready for immediate use. Electronically Signed   By: Aletta Edouard M.D.   On: 05/13/2022 09:56   IR US Guide Vasc Access Right  Result Date: 05/13/2022 CLINICAL DATA:  End-stage renal disease and need for tunneled hemodialysis catheter placement. EXAM: TUNNELED CENTRAL VENOUS HEMODIALYSIS CATHETER PLACEMENT WITH ULTRASOUND AND FLUOROSCOPIC GUIDANCE ANESTHESIA/SEDATION: Moderate (conscious) sedation was employed during this procedure. A total of Versed 1.0 mg and Fentanyl 50 mcg was administered intravenously by radiology nursing. Moderate Sedation Time: 14 minutes. The patient's level of consciousness and vital signs were monitored continuously by radiology nursing throughout the procedure under my direct supervision. MEDICATIONS: 1 g IV vancomycin. FLUOROSCOPY: 18 seconds.  2.0 mGy. PROCEDURE: The procedure, risks, benefits, and  alternatives were explained to the patient. Questions regarding the procedure were encouraged and answered. The patient understands and consents to the procedure. A timeout was performed prior to initiating the procedure. The right neck and chest were prepped with chlorhexidine in a sterile fashion, and a sterile drape was applied covering the operative field. Maximum barrier sterile technique with sterile gowns and gloves were used for the procedure. Local anesthesia was provided with 1% lidocaine. Ultrasound was used to confirm patency of the right internal jugular vein. A permanent ultrasound image was recorded. After creating a small venotomy incision, a 21 gauge needle was advanced into the right internal jugular vein under direct, real-time ultrasound guidance. Ultrasound image documentation was performed. After securing guidewire access, an 8 Fr dilator was placed. A J-wire was kinked to measure appropriate catheter length. A Palindrome tunneled hemodialysis catheter measuring 19 cm from tip to cuff was chosen for placement. This was tunneled in a retrograde fashion from the chest wall to the venotomy incision. At the venotomy, serial dilatation was performed and a 15 Fr peel-away sheath was placed over a guidewire. The catheter was then placed through the sheath and the sheath removed. Final catheter positioning was confirmed and documented with a fluoroscopic spot image. The catheter was aspirated, flushed with saline, and injected with appropriate volume heparin dwells. The venotomy incision was closed with subcuticular 4-0 Vicryl. Dermabond was applied to the incision. The catheter exit site was secured with 0-Prolene retention sutures. COMPLICATIONS: None.  No pneumothorax. FINDINGS: After catheter placement, the tip lies in the right atrium. The catheter aspirates normally and is ready for immediate use. IMPRESSION: Placement of tunneled hemodialysis catheter via the right internal jugular vein. The  catheter tip lies  in the right atrium. The catheter is ready for immediate use. Electronically Signed   By: Aletta Edouard M.D.   On: 05/13/2022 09:56   ECHOCARDIOGRAM COMPLETE  Result Date: 05/12/2022    ECHOCARDIOGRAM REPORT   Patient Name:   Joesph Marcy Delsignore Date of Exam: 05/12/2022 Medical Rec #:  [id number]         Height:       63.0 in Accession #:    [id number]        Weight:       183.8 lb Date of Birth:  09/13/1965         BSA:          1.865 m Patient Age:    82 years          BP:           165/122 mmHg Patient Gender: M                 HR:           84 bpm. Exam Location:  Inpatient Procedure: 2D Echo, Cardiac Doppler, Color Doppler and Intracardiac            Opacification Agent Indications:    CHF-acute systolic  History:        Patient has prior history of Echocardiogram examinations, most                 recent 05/13/2021. CHF; Risk Factors:Hypertension and                 Dyslipidemia. CKD.  Sonographer:    Clayton Lefort RDCS (AE) Referring Phys: Shela Leff  Sonographer Comments: Technically difficult study due to poor echo windows. Image acquisition challenging due to respiratory motion. IMPRESSIONS  1. Left ventricular ejection fraction, by estimation, is 45 to 50%. The left ventricle has mildly decreased function. The left ventricle has no regional wall motion abnormalities. There is moderate left ventricular hypertrophy. Left ventricular diastolic parameters are indeterminate.  2. Right ventricular systolic function was not well visualized. The right ventricular size is normal. Tricuspid regurgitation signal is inadequate for assessing PA pressure.  3. The mitral valve is grossly normal. No evidence of mitral valve regurgitation. No evidence of mitral stenosis.  4. The aortic valve was not well visualized. Aortic valve regurgitation is not visualized. No aortic stenosis is present.  5. The inferior vena cava is normal in size with <50% respiratory variability, suggesting right atrial pressure  of 8 mmHg. Comparison(s): Prior images reviewed side by side. Changes from prior study are noted. LVEF improved from 30% to 45-50% now. FINDINGS  Left Ventricle: Left ventricular ejection fraction, by estimation, is 45 to 50%. The left ventricle has mildly decreased function. The left ventricle has no regional wall motion abnormalities. Definity contrast agent was given IV to delineate the left ventricular endocardial borders. The left ventricular internal cavity size was normal in size. There is moderate left ventricular hypertrophy. Left ventricular diastolic parameters are indeterminate. Right Ventricle: The right ventricular size is normal. Right vetricular wall thickness was not well visualized. Right ventricular systolic function was not well visualized. Tricuspid regurgitation signal is inadequate for assessing PA pressure. Left Atrium: Left atrial size was normal in size. Right Atrium: Right atrial size was normal in size. Pericardium: There is no evidence of pericardial effusion. Mitral Valve: The mitral valve is grossly normal. No evidence of mitral valve regurgitation. No evidence of mitral valve stenosis. Tricuspid Valve: The tricuspid valve is not well  visualized. Tricuspid valve regurgitation is not demonstrated. No evidence of tricuspid stenosis. Aortic Valve: The aortic valve was not well visualized. Aortic valve regurgitation is not visualized. No aortic stenosis is present. Aortic valve mean gradient measures 3.0 mmHg. Aortic valve peak gradient measures 5.2 mmHg. Aortic valve area, by VTI measures 1.63 cm. Pulmonic Valve: The pulmonic valve was not well visualized. Pulmonic valve regurgitation is not visualized. No evidence of pulmonic stenosis. Aorta: The aortic root and ascending aorta are structurally normal, with no evidence of dilitation. Venous: The inferior vena cava is normal in size with less than 50% respiratory variability, suggesting right atrial pressure of 8 mmHg. IAS/Shunts: No  atrial level shunt detected by color flow Doppler.  LEFT VENTRICLE PLAX 2D LVIDd:         5.30 cm      Diastology LVIDs:         4.40 cm      LV e' medial:    4.35 cm/s LV PW:         1.40 cm      LV E/e' medial:  14.1 LV IVS:        1.40 cm      LV e' lateral:   4.90 cm/s LVOT diam:     2.00 cm      LV E/e' lateral: 12.5 LV SV:         33 LV SV Index:   18 LVOT Area:     3.14 cm  LV Volumes (MOD) LV vol d, MOD A2C: 103.0 ml LV vol d, MOD A4C: 122.0 ml LV vol s, MOD A2C: 68.7 ml LV vol s, MOD A4C: 72.6 ml LV SV MOD A2C:     34.3 ml LV SV MOD A4C:     122.0 ml LV SV MOD BP:      42.4 ml RIGHT VENTRICLE         IVC TAPSE (M-mode): 2.5 cm  IVC diam: 1.90 cm LEFT ATRIUM           Index        RIGHT ATRIUM           Index LA diam:      3.50 cm 1.88 cm/m   RA Area:     16.40 cm LA Vol (A2C): 31.2 ml 16.73 ml/m  RA Volume:   41.40 ml  22.19 ml/m LA Vol (A4C): 62.9 ml 33.72 ml/m  AORTIC VALVE AV Area (Vmax):    1.59 cm AV Area (Vmean):   1.50 cm AV Area (VTI):     1.63 cm AV Vmax:           114.00 cm/s AV Vmean:          75.500 cm/s AV VTI:            0.201 m AV Peak Grad:      5.2 mmHg AV Mean Grad:      3.0 mmHg LVOT Vmax:         57.80 cm/s LVOT Vmean:        36.100 cm/s LVOT VTI:          0.104 m LVOT/AV VTI ratio: 0.52  AORTA Ao Root diam: 3.40 cm Ao Asc diam:  3.30 cm MITRAL VALVE MV Area (PHT): 3.27 cm    SHUNTS MV Decel Time: 232 msec    Systemic VTI:  0.10 m MV E velocity: 61.40 cm/s  Systemic Diam: 2.00 cm MV A velocity: 58.10 cm/s MV E/A ratio:  1.06 Vishnu Priya Mallipeddi Electronically signed by Lorelee Cover Mallipeddi Signature Date/Time: 05/12/2022/12:46:53 PM    Final    US RENAL  Result Date: 05/12/2022 CLINICAL DATA:  Acute kidney injury. EXAM: RENAL / URINARY TRACT ULTRASOUND COMPLETE COMPARISON:  06/02/2021 FINDINGS: Right Kidney: Renal measurements: 8.9 x 4.9 x 4.1 cm = volume: 93 mL. Increased cortical echogenicity without hydronephrosis. Left Kidney: Renal measurements: 8.7 x 4.2 x 4.0 cm  = volume: 76 mL. Increased echogenicity without hydronephrosis. 12 mm lower pole cyst again identified. Bladder: Could not be visualized secondary to patient positioning. Other: None. IMPRESSION: 1. No hydronephrosis. 2. Increased cortical echogenicity in both kidneys consistent with chronic medical renal disease. 3. Bladder could not be visualized secondary to patient positioning. Electronically Signed   By: Misty Stanley M.D.   On: 05/12/2022 06:50   DG Chest 2 View  Result Date: 05/11/2022 CLINICAL DATA:  Shortness of breath. EXAM: CHEST - 2 VIEW COMPARISON:  Chest radiograph dated June 03, 2021. FINDINGS: The heart is mildly enlarged. Mild pulmonary vascular congestion. Lungs otherwise clear without evidence of focal consolidation or pleural effusion. Thoracic spondylosis. IMPRESSION: Mild cardiomegaly and pulmonary vascular congestion. Electronically Signed   By: Keane Police D.O.   On: 05/11/2022 23:59    Labs: BMET Recent Labs  Lab 05/11/22 2342 05/12/22 0636 05/13/22 0035  NA 140 140 138  K 4.5 5.1 3.8  CL 108 109 107  CO2 17* 17* 18*  GLUCOSE 107* 93 107*  BUN 74* 87* 89*  CREATININE 9.01* 9.09* 9.46*  CALCIUM 5.7* 5.6* 6.4*  PHOS  --  7.9*  --    CBC Recent Labs  Lab 05/11/22 2342 05/12/22 0636 05/12/22 1536 05/13/22 0035  WBC 9.6 6.8  --  11.0*  NEUTROABS  --   --   --  9.2*  HGB 7.7* 5.9* 7.3* 7.3*  HCT 24.0* 19.1* 21.9* 21.4*  MCV 90.9 94.1  --  87.7  PLT 279 200  --  234    Medications:     calcitRIOL  0.5 mcg Oral Daily   calcium acetate  1,334 mg Oral TID WC   calcium carbonate  800 mg of elemental calcium Oral BID   carvedilol  6.25 mg Oral BID WC   Chlorhexidine Gluconate Cloth  6 each Topical Daily   darbepoetin (ARANESP) injection - DIALYSIS  60 mcg Subcutaneous Q Mon-1800   furosemide  40 mg Intravenous BID   hydrALAZINE  50 mg Oral Q8H   isosorbide mononitrate  60 mg Oral Daily   rosuvastatin  5 mg Oral Daily   Elmarie Shiley, MD 05/13/2022, 11:42  AM

## 2022-05-13 NOTE — Procedures (Signed)
Interventional Radiology Procedure Note  Procedure: Tunneled HD catheter placement  Complications: None  Estimated Blood Loss: < 10 mL  Findings: Right IJ tunneled Palindrome catheter measuring 19 cm from tip to cuff placed with tip in RA. OK to use.  Venetia Night. Kathlene Cote, M.D Pager:  [phone]

## 2022-05-13 NOTE — Progress Notes (Signed)
PROGRESS NOTE    Paul [name]  IZT:[id number] DOB: Jun 14, 1965 DOA: 05/11/2022 PCP: Ladell Pier, MD   Brief Narrative:  HPI: Paul [name] is a 57 y.o. male with medical history significant of chronic combined CHF, hypertension, CKD stage IV, anemia of chronic disease, hyperlipidemia, schizophrenia, right eye blindness presented to the ED with a chief complaint of epistaxis.  He reported not taking any of his home medications and noted to be hypertensive with systolic 382 and diastolic in the 505L.  Labs showing hemoglobin 7.7 (previously 9-10), bicarb 17 (chronically low), anion gap 15, glucose 107, BUN 74, creatinine 9.0 (baseline 4.0), calcium 5.7, albumin 3.4, BNP 1590, troponin 158> 153.  Chest x-ray showing mild cardiomegaly and pulmonary vascular congestion. Medications administered in the ED include IV Lasix 80 mg, IV labetalol 20 mg, IV Ativan 1 mg, and Afrin nasal spray.  Nasal packing placed in right nare for epistaxis.   Patient states he stopped taking all of his home medications 5 months ago.  Yesterday all of a sudden his right nostril started bleeding.  He denies any injuries to his face or nose.  He does not take any blood thinners.  He is reporting ongoing shortness of breath for several months and occasional cough.  Denies chest pain.  Reports adequate urine output.  He is having loose stools but denies any vomiting or abdominal pain.  No other complaints.  Assessment & Plan:   Principal Problem:   CHF exacerbation ([place]) Active Problems:   Acute kidney injury superimposed on chronic kidney disease (HCC)   Elevated troponin   Metabolic acidosis   Epistaxis   Acute on chronic blood loss anemia   Hypertensive urgency   Hypocalcemia  Acute on chronic combined systolic and diastolic CHF: Secondary to noncompliance.  BNP 1590.  His baseline BNP appears to be around 600.  Chest x-ray showing pulmonary vascular congestion.  He is not hypoxic. Echo done in January  2023 showing EF 30 to 97%, grade 2 diastolic dysfunction.  Repeat echo actually shows improved ejection fraction 45 to 50%. He received 1 dose of IV Lasix in the ED. remains on IV Lasix 40 twice daily.   AKI on CKD stage IV, now with ESRD Metabolic acidosis/severe hypocalcemia baseline around 4.0.  Presented with creatinine of 9.0, bicarb 17, chronically low.  Acute kidney injury likely cardiorenal in the setting of acutely decompensated CHF.  In addition, noncompliant with meds and has uncontrolled hypertension.  Renal ultrasound unremarkable.  Continue to avoid nephrotoxic agents, nephrology consulted, patient underwent TDC today, beginning hemodialysis today.   Epistaxis Acute on chronic anemia secondary to blood loss Epistaxis controlled after placement of nasal packing.  He is not taking aspirin.  He has anemia of chronic kidney disease with baseline hemoglobin 9-10, presented with 7.7 in the setting of acute blood loss and hemoglobin further dropped to 5.9 on the morning of 05/12/2022, he received 1 unit of PRBC transfusion.  Currently hemoglobin over 7.  Patient feels uncomfortable with the nasal packing and wants to take it out.  I have discussed with ENT on-call Dr. Marcelline Deist who recommends keeping the nasal packing in until Wednesday morning.  He mentioned that this can be removed by any physician, if no physician is comfortable, he should be called for that.  Monitor hemoglobin daily.   Hypertensive emergency Secondary to noncompliance.  He meets criteria for hypertensive emergency based on acute renal failure and epistaxis in the setting of elevated blood pressure.  All  his home medications including Coreg, hydralazine and Imdur has been resumed.  Blood pressure much better now.   Elevated troponin and acute EKG changes EKG showing T wave inversions in lateral leads which appear new since prior EKG from January 2023.  ACS less likely as troponin stable (158> 153) and patient is not endorsing  chest pain.  Troponin elevation likely due to demand ischemia in the setting of decompensated CHF, acute renal failure, volume overload, and uncontrolled hypertension.  Echo ruled out wall motion abnormality.   Hyperlipidemia -Continue Crestor    DVT prophylaxis: SCDs Start: 05/12/22 0520   Code Status: Full Code  Family Communication:  None present at bedside.  Plan of care discussed with patient in length and he/she verbalized understanding and agreed with it.  Status is: Inpatient Remains inpatient appropriate because: Patient very sick with multiple medical problems as mentioned above.   Estimated body mass index is 30.1 kg/m as calculated from the following:   Height as of 06/07/21: 5\' 3"  (1.6 m).   Weight as of this encounter: 77.1 kg.    Nutritional Assessment: Body mass index is 30.1 kg/m.Marland Kitchen Seen by dietician.  I agree with the assessment and plan as outlined below: Nutrition Status:        . Skin Assessment: I have examined the patient's skin and I agree with the wound assessment as performed by the wound care RN as outlined below:    Consultants:  Nephrology IR Procedures:  As above  Antimicrobials:  Anti-infectives (From admission, onward)    Start     Dose/Rate Route Frequency Ordered Stop   05/13/22 0830  vancomycin (VANCOCIN) IVPB 1000 mg/200 mL premix       Note to Pharmacy: Give in IR for surgical prophylaxis   1,000 mg 200 mL/hr over 60 Minutes Intravenous  Once 05/13/22 0740 05/13/22 1027         Subjective:  Patient seen and examined, he just feels uncomfortable due to nasal packing but otherwise has no complaints.  Denies shortness of breath.  Objective: Vitals:   05/13/22 1100 05/13/22 1112 05/13/22 1115 05/13/22 1145  BP: (!) 143/83 (!) 143/83 (!) 150/87 (!) 135/92  Pulse: 95 (!) 106 (!) 106 80  Resp:  18    Temp:  98.4 F (36.9 C)    TempSrc:  Oral    SpO2: 99% 99% 99% 97%  Weight:        Intake/Output Summary (Last 24 hours)  at 05/13/2022 1253 Last data filed at 05/13/2022 1027 Gross per 24 hour  Intake 1575 ml  Output 2450 ml  Net -875 ml    Filed Weights   05/13/22 0003  Weight: 77.1 kg    Examination:  General exam: Appears calm and comfortable  Respiratory system: Clear to auscultation. Respiratory effort normal. Cardiovascular system: S1 & S2 heard, RRR. No JVD, murmurs, rubs, gallops or clicks. No pedal edema. Gastrointestinal system: Abdomen is nondistended, soft and nontender. No organomegaly or masses felt. Normal bowel sounds heard. Central nervous system: Alert and oriented. No focal neurological deficits. Extremities: Symmetric 5 x 5 power. Skin: No rashes, lesions or ulcers.  Psychiatry: Judgement and insight appear normal. Mood & affect appropriate.    Data Reviewed: I have personally reviewed following labs and imaging studies  CBC: Recent Labs  Lab 05/11/22 2342 05/12/22 0636 05/12/22 1536 05/13/22 0035  WBC 9.6 6.8  --  11.0*  NEUTROABS  --   --   --  9.2*  HGB 7.7* 5.9*  7.3* 7.3*  HCT 24.0* 19.1* 21.9* 21.4*  MCV 90.9 94.1  --  87.7  PLT 279 200  --  672    Basic Metabolic Panel: Recent Labs  Lab 05/11/22 2342 05/12/22 0636 05/13/22 0035  NA 140 140 138  K 4.5 5.1 3.8  CL 108 109 107  CO2 17* 17* 18*  GLUCOSE 107* 93 107*  BUN 74* 87* 89*  CREATININE 9.01* 9.09* 9.46*  CALCIUM 5.7* 5.6* 6.4*  MG  --  2.0  --   PHOS  --  7.9*  --     GFR: CrCl cannot be calculated (Unknown ideal weight.). Liver Function Tests: Recent Labs  Lab 05/11/22 2342 05/12/22 0636  AST 14*  --   ALT 15  --   ALKPHOS 86  --   BILITOT 0.9  --   PROT 7.8  --   ALBUMIN 3.4* 3.2*    No results for input(s): "LIPASE", "AMYLASE" in the last 168 hours. No results for input(s): "AMMONIA" in the last 168 hours. Coagulation Profile: Recent Labs  Lab 05/11/22 2342  INR 1.2    Cardiac Enzymes: No results for input(s): "CKTOTAL", "CKMB", "CKMBINDEX", "TROPONINI" in the last 168  hours. BNP (last 3 results) No results for input(s): "PROBNP" in the last 8760 hours. HbA1C: No results for input(s): "HGBA1C" in the last 72 hours. CBG: No results for input(s): "GLUCAP" in the last 168 hours. Lipid Profile: No results for input(s): "CHOL", "HDL", "LDLCALC", "TRIG", "CHOLHDL", "LDLDIRECT" in the last 72 hours. Thyroid Function Tests: No results for input(s): "TSH", "T4TOTAL", "FREET4", "T3FREE", "THYROIDAB" in the last 72 hours. Anemia Panel: Recent Labs    05/12/22 1106  FERRITIN 244  TIBC 242*  IRON 55   Sepsis Labs: No results for input(s): "PROCALCITON", "LATICACIDVEN" in the last 168 hours.  No results found for this or any previous visit (from the past 240 hour(s)).   Radiology Studies: IR Fluoro Guide CV Line Right  Result Date: 05/13/2022 CLINICAL DATA:  End-stage renal disease and need for tunneled hemodialysis catheter placement. EXAM: TUNNELED CENTRAL VENOUS HEMODIALYSIS CATHETER PLACEMENT WITH ULTRASOUND AND FLUOROSCOPIC GUIDANCE ANESTHESIA/SEDATION: Moderate (conscious) sedation was employed during this procedure. A total of Versed 1.0 mg and Fentanyl 50 mcg was administered intravenously by radiology nursing. Moderate Sedation Time: 14 minutes. The patient's level of consciousness and vital signs were monitored continuously by radiology nursing throughout the procedure under my direct supervision. MEDICATIONS: 1 g IV vancomycin. FLUOROSCOPY: 18 seconds.  2.0 mGy. PROCEDURE: The procedure, risks, benefits, and alternatives were explained to the patient. Questions regarding the procedure were encouraged and answered. The patient understands and consents to the procedure. A timeout was performed prior to initiating the procedure. The right neck and chest were prepped with chlorhexidine in a sterile fashion, and a sterile drape was applied covering the operative field. Maximum barrier sterile technique with sterile gowns and gloves were used for the procedure.  Local anesthesia was provided with 1% lidocaine. Ultrasound was used to confirm patency of the right internal jugular vein. A permanent ultrasound image was recorded. After creating a small venotomy incision, a 21 gauge needle was advanced into the right internal jugular vein under direct, real-time ultrasound guidance. Ultrasound image documentation was performed. After securing guidewire access, an 8 Fr dilator was placed. A J-wire was kinked to measure appropriate catheter length. A Palindrome tunneled hemodialysis catheter measuring 19 cm from tip to cuff was chosen for placement. This was tunneled in a retrograde fashion from the chest  wall to the venotomy incision. At the venotomy, serial dilatation was performed and a 15 Fr peel-away sheath was placed over a guidewire. The catheter was then placed through the sheath and the sheath removed. Final catheter positioning was confirmed and documented with a fluoroscopic spot image. The catheter was aspirated, flushed with saline, and injected with appropriate volume heparin dwells. The venotomy incision was closed with subcuticular 4-0 Vicryl. Dermabond was applied to the incision. The catheter exit site was secured with 0-Prolene retention sutures. COMPLICATIONS: None.  No pneumothorax. FINDINGS: After catheter placement, the tip lies in the right atrium. The catheter aspirates normally and is ready for immediate use. IMPRESSION: Placement of tunneled hemodialysis catheter via the right internal jugular vein. The catheter tip lies in the right atrium. The catheter is ready for immediate use. Electronically Signed   By: Aletta Edouard M.D.   On: 05/13/2022 09:56   IR US Guide Vasc Access Right  Result Date: 05/13/2022 CLINICAL DATA:  End-stage renal disease and need for tunneled hemodialysis catheter placement. EXAM: TUNNELED CENTRAL VENOUS HEMODIALYSIS CATHETER PLACEMENT WITH ULTRASOUND AND FLUOROSCOPIC GUIDANCE ANESTHESIA/SEDATION: Moderate (conscious)  sedation was employed during this procedure. A total of Versed 1.0 mg and Fentanyl 50 mcg was administered intravenously by radiology nursing. Moderate Sedation Time: 14 minutes. The patient's level of consciousness and vital signs were monitored continuously by radiology nursing throughout the procedure under my direct supervision. MEDICATIONS: 1 g IV vancomycin. FLUOROSCOPY: 18 seconds.  2.0 mGy. PROCEDURE: The procedure, risks, benefits, and alternatives were explained to the patient. Questions regarding the procedure were encouraged and answered. The patient understands and consents to the procedure. A timeout was performed prior to initiating the procedure. The right neck and chest were prepped with chlorhexidine in a sterile fashion, and a sterile drape was applied covering the operative field. Maximum barrier sterile technique with sterile gowns and gloves were used for the procedure. Local anesthesia was provided with 1% lidocaine. Ultrasound was used to confirm patency of the right internal jugular vein. A permanent ultrasound image was recorded. After creating a small venotomy incision, a 21 gauge needle was advanced into the right internal jugular vein under direct, real-time ultrasound guidance. Ultrasound image documentation was performed. After securing guidewire access, an 8 Fr dilator was placed. A J-wire was kinked to measure appropriate catheter length. A Palindrome tunneled hemodialysis catheter measuring 19 cm from tip to cuff was chosen for placement. This was tunneled in a retrograde fashion from the chest wall to the venotomy incision. At the venotomy, serial dilatation was performed and a 15 Fr peel-away sheath was placed over a guidewire. The catheter was then placed through the sheath and the sheath removed. Final catheter positioning was confirmed and documented with a fluoroscopic spot image. The catheter was aspirated, flushed with saline, and injected with appropriate volume heparin  dwells. The venotomy incision was closed with subcuticular 4-0 Vicryl. Dermabond was applied to the incision. The catheter exit site was secured with 0-Prolene retention sutures. COMPLICATIONS: None.  No pneumothorax. FINDINGS: After catheter placement, the tip lies in the right atrium. The catheter aspirates normally and is ready for immediate use. IMPRESSION: Placement of tunneled hemodialysis catheter via the right internal jugular vein. The catheter tip lies in the right atrium. The catheter is ready for immediate use. Electronically Signed   By: Aletta Edouard M.D.   On: 05/13/2022 09:56   ECHOCARDIOGRAM COMPLETE  Result Date: 05/12/2022    ECHOCARDIOGRAM REPORT   Patient Name:   Paul [name]  Koenigs Date of Exam: 05/12/2022 Medical Rec #:  [id number]         Height:       63.0 in Accession #:    [id number]        Weight:       183.8 lb Date of Birth:  1965-07-15         BSA:          1.865 m Patient Age:    61 years          BP:           165/122 mmHg Patient Gender: M                 HR:           84 bpm. Exam Location:  Inpatient Procedure: 2D Echo, Cardiac Doppler, Color Doppler and Intracardiac            Opacification Agent Indications:    CHF-acute systolic  History:        Patient has prior history of Echocardiogram examinations, most                 recent 05/13/2021. CHF; Risk Factors:Hypertension and                 Dyslipidemia. CKD.  Sonographer:    Clayton Lefort RDCS (AE) Referring Phys: Shela Leff  Sonographer Comments: Technically difficult study due to poor echo windows. Image acquisition challenging due to respiratory motion. IMPRESSIONS  1. Left ventricular ejection fraction, by estimation, is 45 to 50%. The left ventricle has mildly decreased function. The left ventricle has no regional wall motion abnormalities. There is moderate left ventricular hypertrophy. Left ventricular diastolic parameters are indeterminate.  2. Right ventricular systolic function was not well visualized. The right  ventricular size is normal. Tricuspid regurgitation signal is inadequate for assessing PA pressure.  3. The mitral valve is grossly normal. No evidence of mitral valve regurgitation. No evidence of mitral stenosis.  4. The aortic valve was not well visualized. Aortic valve regurgitation is not visualized. No aortic stenosis is present.  5. The inferior vena cava is normal in size with <50% respiratory variability, suggesting right atrial pressure of 8 mmHg. Comparison(s): Prior images reviewed side by side. Changes from prior study are noted. LVEF improved from 30% to 45-50% now. FINDINGS  Left Ventricle: Left ventricular ejection fraction, by estimation, is 45 to 50%. The left ventricle has mildly decreased function. The left ventricle has no regional wall motion abnormalities. Definity contrast agent was given IV to delineate the left ventricular endocardial borders. The left ventricular internal cavity size was normal in size. There is moderate left ventricular hypertrophy. Left ventricular diastolic parameters are indeterminate. Right Ventricle: The right ventricular size is normal. Right vetricular wall thickness was not well visualized. Right ventricular systolic function was not well visualized. Tricuspid regurgitation signal is inadequate for assessing PA pressure. Left Atrium: Left atrial size was normal in size. Right Atrium: Right atrial size was normal in size. Pericardium: There is no evidence of pericardial effusion. Mitral Valve: The mitral valve is grossly normal. No evidence of mitral valve regurgitation. No evidence of mitral valve stenosis. Tricuspid Valve: The tricuspid valve is not well visualized. Tricuspid valve regurgitation is not demonstrated. No evidence of tricuspid stenosis. Aortic Valve: The aortic valve was not well visualized. Aortic valve regurgitation is not visualized. No aortic stenosis is present. Aortic valve mean gradient measures 3.0 mmHg. Aortic valve peak gradient measures  5.2  mmHg. Aortic valve area, by VTI measures 1.63 cm. Pulmonic Valve: The pulmonic valve was not well visualized. Pulmonic valve regurgitation is not visualized. No evidence of pulmonic stenosis. Aorta: The aortic root and ascending aorta are structurally normal, with no evidence of dilitation. Venous: The inferior vena cava is normal in size with less than 50% respiratory variability, suggesting right atrial pressure of 8 mmHg. IAS/Shunts: No atrial level shunt detected by color flow Doppler.  LEFT VENTRICLE PLAX 2D LVIDd:         5.30 cm      Diastology LVIDs:         4.40 cm      LV e' medial:    4.35 cm/s LV PW:         1.40 cm      LV E/e' medial:  14.1 LV IVS:        1.40 cm      LV e' lateral:   4.90 cm/s LVOT diam:     2.00 cm      LV E/e' lateral: 12.5 LV SV:         33 LV SV Index:   18 LVOT Area:     3.14 cm  LV Volumes (MOD) LV vol d, MOD A2C: 103.0 ml LV vol d, MOD A4C: 122.0 ml LV vol s, MOD A2C: 68.7 ml LV vol s, MOD A4C: 72.6 ml LV SV MOD A2C:     34.3 ml LV SV MOD A4C:     122.0 ml LV SV MOD BP:      42.4 ml RIGHT VENTRICLE         IVC TAPSE (M-mode): 2.5 cm  IVC diam: 1.90 cm LEFT ATRIUM           Index        RIGHT ATRIUM           Index LA diam:      3.50 cm 1.88 cm/m   RA Area:     16.40 cm LA Vol (A2C): 31.2 ml 16.73 ml/m  RA Volume:   41.40 ml  22.19 ml/m LA Vol (A4C): 62.9 ml 33.72 ml/m  AORTIC VALVE AV Area (Vmax):    1.59 cm AV Area (Vmean):   1.50 cm AV Area (VTI):     1.63 cm AV Vmax:           114.00 cm/s AV Vmean:          75.500 cm/s AV VTI:            0.201 m AV Peak Grad:      5.2 mmHg AV Mean Grad:      3.0 mmHg LVOT Vmax:         57.80 cm/s LVOT Vmean:        36.100 cm/s LVOT VTI:          0.104 m LVOT/AV VTI ratio: 0.52  AORTA Ao Root diam: 3.40 cm Ao Asc diam:  3.30 cm MITRAL VALVE MV Area (PHT): 3.27 cm    SHUNTS MV Decel Time: 232 msec    Systemic VTI:  0.10 m MV E velocity: 61.40 cm/s  Systemic Diam: 2.00 cm MV A velocity: 58.10 cm/s MV E/A ratio:  1.06 Vishnu Priya  Mallipeddi Electronically signed by Lorelee Cover Mallipeddi Signature Date/Time: 05/12/2022/12:46:53 PM    Final    US RENAL  Result Date: 05/12/2022 CLINICAL DATA:  Acute kidney injury. EXAM: RENAL / URINARY TRACT ULTRASOUND COMPLETE COMPARISON:  06/02/2021 FINDINGS:  Right Kidney: Renal measurements: 8.9 x 4.9 x 4.1 cm = volume: 93 mL. Increased cortical echogenicity without hydronephrosis. Left Kidney: Renal measurements: 8.7 x 4.2 x 4.0 cm = volume: 76 mL. Increased echogenicity without hydronephrosis. 12 mm lower pole cyst again identified. Bladder: Could not be visualized secondary to patient positioning. Other: None. IMPRESSION: 1. No hydronephrosis. 2. Increased cortical echogenicity in both kidneys consistent with chronic medical renal disease. 3. Bladder could not be visualized secondary to patient positioning. Electronically Signed   By: Misty Stanley M.D.   On: 05/12/2022 06:50   DG Chest 2 View  Result Date: 05/11/2022 CLINICAL DATA:  Shortness of breath. EXAM: CHEST - 2 VIEW COMPARISON:  Chest radiograph dated June 03, 2021. FINDINGS: The heart is mildly enlarged. Mild pulmonary vascular congestion. Lungs otherwise clear without evidence of focal consolidation or pleural effusion. Thoracic spondylosis. IMPRESSION: Mild cardiomegaly and pulmonary vascular congestion. Electronically Signed   By: Keane Police D.O.   On: 05/11/2022 23:59    Scheduled Meds:  calcitRIOL  0.5 mcg Oral Daily   calcium acetate  1,334 mg Oral TID WC   calcium carbonate  800 mg of elemental calcium Oral BID   carvedilol  6.25 mg Oral BID WC   Chlorhexidine Gluconate Cloth  6 each Topical Daily   darbepoetin (ARANESP) injection - DIALYSIS  60 mcg Subcutaneous Q Mon-1800   furosemide  40 mg Intravenous BID   hydrALAZINE  50 mg Oral Q8H   isosorbide mononitrate  60 mg Oral Daily   rosuvastatin  5 mg Oral Daily   Continuous Infusions:   LOS: 1 day   Darliss Cheney, MD [facility]  05/13/2022, 12:53 PM    *Please note that this is a verbal dictation therefore any spelling or grammatical errors are due to the "[place] [employer]" system interpretation.  Please page via [place] and do not message via secure chat for urgent patient care matters. Secure chat can be used for non urgent patient care matters.  How to contact the [facility] Attending or Consulting provider [place] or covering provider during after hours [place], for this patient?  Check the care team in [facility] and look for a) attending/consulting [facility] provider listed and b) the [facility] team listed. Page or secure chat [phone]. Log into [email or web address] and use [place]'s universal password to access. If you do not have the password, please contact the hospital operator. Locate the [facility] provider you are looking for under [facility] and page to a number that you can be directly reached. If you still have difficulty reaching the provider, please page the [facility] (Director on Call) for the Hospitalists listed on amion for assistance.

## 2022-05-14 DIAGNOSIS — D62 Acute posthemorrhagic anemia: Secondary | ICD-10-CM

## 2022-05-14 DIAGNOSIS — I5043 Acute on chronic combined systolic (congestive) and diastolic (congestive) heart failure: Secondary | ICD-10-CM | POA: Diagnosis not present

## 2022-05-14 DIAGNOSIS — R7989 Other specified abnormal findings of blood chemistry: Secondary | ICD-10-CM

## 2022-05-14 DIAGNOSIS — Z515 Encounter for palliative care: Secondary | ICD-10-CM

## 2022-05-14 LAB — BASIC METABOLIC PANEL
Anion gap: 14 (ref 5–15)
BUN: 58 mg/dL — ABNORMAL HIGH (ref 6–20)
CO2: 23 mmol/L (ref 22–32)
Calcium: 7.4 mg/dL — ABNORMAL LOW (ref 8.9–10.3)
Chloride: 99 mmol/L (ref 98–111)
Creatinine, Ser: 6.95 mg/dL — ABNORMAL HIGH (ref 0.61–1.24)
GFR, Estimated: 9 mL/min — ABNORMAL LOW (ref >60)
Glucose, Bld: 104 mg/dL — ABNORMAL HIGH (ref 70–99)
Potassium: 3.5 mmol/L (ref 3.5–5.1)
Sodium: 136 mmol/L (ref 135–145)

## 2022-05-14 LAB — CBC WITH DIFFERENTIAL/PLATELET
Abs Immature Granulocytes: 0.06 10*3/uL (ref 0.00–0.07)
Basophils Absolute: 0 10*3/uL (ref 0.0–0.1)
Basophils Relative: 0 %
Eosinophils Absolute: 0.2 10*3/uL (ref 0.0–0.5)
Eosinophils Relative: 2 %
HCT: 23 % — ABNORMAL LOW (ref 39.0–52.0)
Hemoglobin: 7.6 g/dL — ABNORMAL LOW (ref 13.0–17.0)
Immature Granulocytes: 1 %
Lymphocytes Relative: 8 %
Lymphs Abs: 0.9 10*3/uL (ref 0.7–4.0)
MCH: 29.3 pg (ref 26.0–34.0)
MCHC: 33 g/dL (ref 30.0–36.0)
MCV: 88.8 fL (ref 80.0–100.0)
Monocytes Absolute: 1 10*3/uL (ref 0.1–1.0)
Monocytes Relative: 9 %
Neutro Abs: 8.4 10*3/uL — ABNORMAL HIGH (ref 1.7–7.7)
Neutrophils Relative %: 80 %
Platelets: 241 10*3/uL (ref 150–400)
RBC: 2.59 MIL/uL — ABNORMAL LOW (ref 4.22–5.81)
RDW: 13.1 % (ref 11.5–15.5)
WBC: 10.6 10*3/uL — ABNORMAL HIGH (ref 4.0–10.5)
nRBC: 0 % (ref 0.0–0.2)

## 2022-05-14 LAB — PTH, INTACT AND CALCIUM
Calcium, Total (PTH): 5.8 mg/dL — CL (ref 8.7–10.2)
PTH: 128 pg/mL — ABNORMAL HIGH (ref 15–65)

## 2022-05-14 LAB — PARATHYROID HORMONE, INTACT (NO CA): PTH: 124 pg/mL — ABNORMAL HIGH (ref 15–65)

## 2022-05-14 LAB — HEPATITIS B SURFACE ANTIBODY, QUANTITATIVE: Hep B S AB Quant (Post): <3.1 m[IU]/mL — ABNORMAL LOW (ref >9.9)

## 2022-05-14 MED ORDER — HYDRALAZINE HCL 25 MG PO TABS
25.0000 mg | ORAL_TABLET | Freq: Three times a day (TID) | ORAL | Status: DC
  Administered 2022-05-14 – 2022-05-15 (×3): 25 mg via ORAL
  Filled 2022-05-14 (×4): qty 1

## 2022-05-14 MED ORDER — HEPARIN SODIUM (PORCINE) 1000 UNIT/ML IJ SOLN
INTRAMUSCULAR | Status: AC
  Administered 2022-05-14: 3200 [IU]
  Filled 2022-05-14: qty 4

## 2022-05-14 MED ORDER — ISOSORBIDE MONONITRATE ER 30 MG PO TB24
30.0000 mg | ORAL_TABLET | Freq: Every day | ORAL | Status: DC
  Administered 2022-05-15 – 2022-05-17 (×3): 30 mg via ORAL
  Filled 2022-05-14 (×3): qty 1

## 2022-05-14 NOTE — Progress Notes (Signed)
PROGRESS NOTE    Paul [name]  CWC:[id number] DOB: 1965-05-28 DOA: 05/11/2022 PCP: Ladell Pier, MD   Brief Narrative:  HPI: Paul [name] is a 57 y.o. male with medical history significant of chronic combined CHF, hypertension, CKD stage IV, anemia of chronic disease, hyperlipidemia, schizophrenia, right eye blindness presented to the ED with a chief complaint of epistaxis.  He reported not taking any of his home medications and noted to be hypertensive with systolic 761 and diastolic in the 607P.  Labs showing hemoglobin 7.7 (previously 9-10), bicarb 17 (chronically low), anion gap 15, glucose 107, BUN 74, creatinine 9.0 (baseline 4.0), calcium 5.7, albumin 3.4, BNP 1590, troponin 158> 153.  Chest x-ray showing mild cardiomegaly and pulmonary vascular congestion. Medications administered in the ED include IV Lasix 80 mg, IV labetalol 20 mg, IV Ativan 1 mg, and Afrin nasal spray.  Nasal packing placed in right nare for epistaxis.   Patient states he stopped taking all of his home medications 5 months ago.  Yesterday all of a sudden his right nostril started bleeding.  He denies any injuries to his face or nose.  He does not take any blood thinners.  He is reporting ongoing shortness of breath for several months and occasional cough.  Denies chest pain.  Reports adequate urine output.  He is having loose stools but denies any vomiting or abdominal pain.  No other complaints.  Assessment & Plan:   Principal Problem:   CHF exacerbation ([place]) Active Problems:   Acute kidney injury superimposed on chronic kidney disease (HCC)   Elevated troponin   Metabolic acidosis   Epistaxis   Acute on chronic blood loss anemia   Hypertensive urgency   Hypocalcemia  Acute on chronic combined systolic and diastolic CHF: Secondary to noncompliance.  BNP 1590.  His baseline BNP appears to be around 600.  Chest x-ray showing pulmonary vascular congestion.  He is not hypoxic. Echo done in January  2023 showing EF 30 to 71%, grade 2 diastolic dysfunction.  Repeat echo actually shows improved ejection fraction 45 to 50%. He received 1 dose of IV Lasix in the ED. remains on IV Lasix 40 twice daily.  Also started on hemodialysis now.  Nephrology managing.   AKI on CKD stage IV, now with ESRD Metabolic acidosis/severe hypocalcemia baseline around 4.0.  Presented with creatinine of 9.0, bicarb 17, chronically low.  Acute kidney injury likely cardiorenal in the setting of acutely decompensated CHF.  In addition, noncompliant with meds and has uncontrolled hypertension.  Renal ultrasound unremarkable.  Continue to avoid nephrotoxic agents, nephrology consulted, patient underwent TDC been by IR and was initiated on hemodialysis on Monday 24, he is receiving another session of dialysis today.  Nephrology managing.   Epistaxis Acute on chronic anemia secondary to blood loss Epistaxis controlled after placement of nasal packing.  He is not taking aspirin.  He has anemia of chronic kidney disease with baseline hemoglobin 9-10, presented with 7.7 in the setting of acute blood loss and hemoglobin further dropped to 5.9 on the morning of 05/12/2022, he received 1 unit of PRBC transfusion.  Currently hemoglobin over 7.  Patient feels uncomfortable with the nasal packing and wants to take it out.  I have discussed with ENT on-call Dr. Marcelline Deist on 05/13/2022, who recommends keeping the nasal packing in until Wednesday/tomorrow morning.  He mentioned that this can be removed by any physician, if no physician is comfortable, he should be called for that.  Monitor hemoglobin daily which  is a stable.   Hypertensive emergency Secondary to noncompliance.  He meets criteria for hypertensive emergency based on acute renal failure and epistaxis in the setting of elevated blood pressure.  All his home medications including Coreg, hydralazine and Imdur has been resumed.  Now that he has also been started on dialysis, his blood  pressure is much better controlled and in fact sometimes on the low normal side, it was low normal this morning even before getting his antihypertensives at this point in time, I will back off and reduce Imdur from 60 to 30 mg and hydralazine to 25 mg 3 times daily from 50 mg 3 times daily.   Elevated troponin and acute EKG changes EKG showing T wave inversions in lateral leads which appear new since prior EKG from January 2023.  ACS less likely as troponin stable (158> 153) and patient is not endorsing chest pain.  Troponin elevation likely due to demand ischemia in the setting of decompensated CHF, acute renal failure, volume overload, and uncontrolled hypertension.  Echo ruled out wall motion abnormality.   Hyperlipidemia -Continue Crestor    DVT prophylaxis: SCDs Start: 05/12/22 0520   Code Status: Full Code  Family Communication:  None present at bedside.  Plan of care discussed with patient in length and he/she verbalized understanding and agreed with it.  Status is: Inpatient Remains inpatient appropriate because: Patient been started on hemodialysis as a new patient, will need clip process before discharge.   Estimated body mass index is 29.91 kg/m as calculated from the following:   Height as of 06/07/21: 5\' 3"  (1.6 m).   Weight as of this encounter: 76.6 kg.    Nutritional Assessment: Body mass index is 29.91 kg/m.Marland Kitchen Seen by dietician.  I agree with the assessment and plan as outlined below: Nutrition Status:    Skin Assessment: I have examined the patient's skin and I agree with the wound assessment as performed by the wound care RN as outlined below:    Consultants:  Nephrology IR Procedures:  As above  Antimicrobials:  Anti-infectives (From admission, onward)    Start     Dose/Rate Route Frequency Ordered Stop   05/13/22 0830  vancomycin (VANCOCIN) IVPB 1000 mg/200 mL premix       Note to Pharmacy: Give in IR for surgical prophylaxis   1,000 mg 200 mL/hr over  60 Minutes Intravenous  Once 05/13/22 0740 05/13/22 1027         Subjective:  Seen and examined in the dialysis unit, has no complaints other than just being uncomfortable due to nasal packing.  Objective: Vitals:   05/14/22 1100 05/14/22 1121 05/14/22 1124 05/14/22 1155  BP: 113/85 121/80 119/74 116/80  Pulse: 81 98 100   Resp: 11 15 13 16   Temp:    98.6 F (37 C)  TempSrc:    Oral  SpO2: 98% 97% 93% 98%  Weight:   76.6 kg     Intake/Output Summary (Last 24 hours) at 05/14/2022 1215 Last data filed at 05/14/2022 1124 Gross per 24 hour  Intake 360 ml  Output 2200 ml  Net -1840 ml    Filed Weights   05/13/22 1647 05/14/22 0808 05/14/22 1124  Weight: 76.6 kg 77.8 kg 76.6 kg    Examination:  General exam: Appears calm and comfortable  Respiratory system: Clear to auscultation. Respiratory effort normal. Cardiovascular system: S1 & S2 heard, RRR. No JVD, murmurs, rubs, gallops or clicks. No pedal edema. Gastrointestinal system: Abdomen is nondistended, soft and  nontender. No organomegaly or masses felt. Normal bowel sounds heard. Central nervous system: Alert and oriented. No focal neurological deficits. Extremities: Symmetric 5 x 5 power. Skin: No rashes, lesions or ulcers.  Psychiatry: Judgement and insight appear normal. Mood & affect appropriate.   Data Reviewed: I have personally reviewed following labs and imaging studies  CBC: Recent Labs  Lab 05/11/22 2342 05/12/22 0636 05/12/22 1536 05/13/22 0035 05/14/22 0036  WBC 9.6 6.8  --  11.0* 10.6*  NEUTROABS  --   --   --  9.2* 8.4*  HGB 7.7* 5.9* 7.3* 7.3* 7.6*  HCT 24.0* 19.1* 21.9* 21.4* 23.0*  MCV 90.9 94.1  --  87.7 88.8  PLT 279 200  --  234 638    Basic Metabolic Panel: Recent Labs  Lab 05/11/22 2342 05/12/22 0636 05/13/22 0035 05/14/22 0036  NA 140 140 138 136  K 4.5 5.1 3.8 3.5  CL 108 109 107 99  CO2 17* 17* 18* 23  GLUCOSE 107* 93 107* 104*  BUN 74* 87* 89* 58*  CREATININE 9.01* 9.09*  9.46* 6.95*  CALCIUM 5.7* 5.6* 6.4* 7.4*  MG  --  2.0  --   --   PHOS  --  7.9*  --   --     GFR: CrCl cannot be calculated (Unknown ideal weight.). Liver Function Tests: Recent Labs  Lab 05/11/22 2342 05/12/22 0636  AST 14*  --   ALT 15  --   ALKPHOS 86  --   BILITOT 0.9  --   PROT 7.8  --   ALBUMIN 3.4* 3.2*    No results for input(s): "LIPASE", "AMYLASE" in the last 168 hours. No results for input(s): "AMMONIA" in the last 168 hours. Coagulation Profile: Recent Labs  Lab 05/11/22 2342  INR 1.2    Cardiac Enzymes: No results for input(s): "CKTOTAL", "CKMB", "CKMBINDEX", "TROPONINI" in the last 168 hours. BNP (last 3 results) No results for input(s): "PROBNP" in the last 8760 hours. HbA1C: No results for input(s): "HGBA1C" in the last 72 hours. CBG: No results for input(s): "GLUCAP" in the last 168 hours. Lipid Profile: No results for input(s): "CHOL", "HDL", "LDLCALC", "TRIG", "CHOLHDL", "LDLDIRECT" in the last 72 hours. Thyroid Function Tests: No results for input(s): "TSH", "T4TOTAL", "FREET4", "T3FREE", "THYROIDAB" in the last 72 hours. Anemia Panel: Recent Labs    05/12/22 1106  FERRITIN 244  TIBC 242*  IRON 55    Sepsis Labs: No results for input(s): "PROCALCITON", "LATICACIDVEN" in the last 168 hours.  No results found for this or any previous visit (from the past 240 hour(s)).   Radiology Studies: IR Fluoro Guide CV Line Right  Result Date: 05/13/2022 CLINICAL DATA:  End-stage renal disease and need for tunneled hemodialysis catheter placement. EXAM: TUNNELED CENTRAL VENOUS HEMODIALYSIS CATHETER PLACEMENT WITH ULTRASOUND AND FLUOROSCOPIC GUIDANCE ANESTHESIA/SEDATION: Moderate (conscious) sedation was employed during this procedure. A total of Versed 1.0 mg and Fentanyl 50 mcg was administered intravenously by radiology nursing. Moderate Sedation Time: 14 minutes. The patient's level of consciousness and vital signs were monitored continuously by  radiology nursing throughout the procedure under my direct supervision. MEDICATIONS: 1 g IV vancomycin. FLUOROSCOPY: 18 seconds.  2.0 mGy. PROCEDURE: The procedure, risks, benefits, and alternatives were explained to the patient. Questions regarding the procedure were encouraged and answered. The patient understands and consents to the procedure. A timeout was performed prior to initiating the procedure. The right neck and chest were prepped with chlorhexidine in a sterile fashion, and a sterile  drape was applied covering the operative field. Maximum barrier sterile technique with sterile gowns and gloves were used for the procedure. Local anesthesia was provided with 1% lidocaine. Ultrasound was used to confirm patency of the right internal jugular vein. A permanent ultrasound image was recorded. After creating a small venotomy incision, a 21 gauge needle was advanced into the right internal jugular vein under direct, real-time ultrasound guidance. Ultrasound image documentation was performed. After securing guidewire access, an 8 Fr dilator was placed. A J-wire was kinked to measure appropriate catheter length. A Palindrome tunneled hemodialysis catheter measuring 19 cm from tip to cuff was chosen for placement. This was tunneled in a retrograde fashion from the chest wall to the venotomy incision. At the venotomy, serial dilatation was performed and a 15 Fr peel-away sheath was placed over a guidewire. The catheter was then placed through the sheath and the sheath removed. Final catheter positioning was confirmed and documented with a fluoroscopic spot image. The catheter was aspirated, flushed with saline, and injected with appropriate volume heparin dwells. The venotomy incision was closed with subcuticular 4-0 Vicryl. Dermabond was applied to the incision. The catheter exit site was secured with 0-Prolene retention sutures. COMPLICATIONS: None.  No pneumothorax. FINDINGS: After catheter placement, the tip lies  in the right atrium. The catheter aspirates normally and is ready for immediate use. IMPRESSION: Placement of tunneled hemodialysis catheter via the right internal jugular vein. The catheter tip lies in the right atrium. The catheter is ready for immediate use. Electronically Signed   By: Aletta Edouard M.D.   On: 05/13/2022 09:56   IR US Guide Vasc Access Right  Result Date: 05/13/2022 CLINICAL DATA:  End-stage renal disease and need for tunneled hemodialysis catheter placement. EXAM: TUNNELED CENTRAL VENOUS HEMODIALYSIS CATHETER PLACEMENT WITH ULTRASOUND AND FLUOROSCOPIC GUIDANCE ANESTHESIA/SEDATION: Moderate (conscious) sedation was employed during this procedure. A total of Versed 1.0 mg and Fentanyl 50 mcg was administered intravenously by radiology nursing. Moderate Sedation Time: 14 minutes. The patient's level of consciousness and vital signs were monitored continuously by radiology nursing throughout the procedure under my direct supervision. MEDICATIONS: 1 g IV vancomycin. FLUOROSCOPY: 18 seconds.  2.0 mGy. PROCEDURE: The procedure, risks, benefits, and alternatives were explained to the patient. Questions regarding the procedure were encouraged and answered. The patient understands and consents to the procedure. A timeout was performed prior to initiating the procedure. The right neck and chest were prepped with chlorhexidine in a sterile fashion, and a sterile drape was applied covering the operative field. Maximum barrier sterile technique with sterile gowns and gloves were used for the procedure. Local anesthesia was provided with 1% lidocaine. Ultrasound was used to confirm patency of the right internal jugular vein. A permanent ultrasound image was recorded. After creating a small venotomy incision, a 21 gauge needle was advanced into the right internal jugular vein under direct, real-time ultrasound guidance. Ultrasound image documentation was performed. After securing guidewire access, an 8 Fr  dilator was placed. A J-wire was kinked to measure appropriate catheter length. A Palindrome tunneled hemodialysis catheter measuring 19 cm from tip to cuff was chosen for placement. This was tunneled in a retrograde fashion from the chest wall to the venotomy incision. At the venotomy, serial dilatation was performed and a 15 Fr peel-away sheath was placed over a guidewire. The catheter was then placed through the sheath and the sheath removed. Final catheter positioning was confirmed and documented with a fluoroscopic spot image. The catheter was aspirated, flushed with saline, and  injected with appropriate volume heparin dwells. The venotomy incision was closed with subcuticular 4-0 Vicryl. Dermabond was applied to the incision. The catheter exit site was secured with 0-Prolene retention sutures. COMPLICATIONS: None.  No pneumothorax. FINDINGS: After catheter placement, the tip lies in the right atrium. The catheter aspirates normally and is ready for immediate use. IMPRESSION: Placement of tunneled hemodialysis catheter via the right internal jugular vein. The catheter tip lies in the right atrium. The catheter is ready for immediate use. Electronically Signed   By: Aletta Edouard M.D.   On: 05/13/2022 09:56    Scheduled Meds:  calcitRIOL  0.5 mcg Oral Daily   calcium acetate  1,334 mg Oral TID WC   calcium carbonate  800 mg of elemental calcium Oral BID   carvedilol  6.25 mg Oral BID WC   Chlorhexidine Gluconate Cloth  6 each Topical Daily   darbepoetin (ARANESP) injection - DIALYSIS  60 mcg Subcutaneous Q Mon-1800   furosemide  40 mg Intravenous BID   hydrALAZINE  50 mg Oral Q8H   isosorbide mononitrate  60 mg Oral Daily   multivitamin  1 tablet Oral QHS   rosuvastatin  5 mg Oral Daily   Vitamin D (Ergocalciferol)  50,000 Units Oral Q Mon   Continuous Infusions:   LOS: 2 days   Darliss Cheney, MD [facility]  05/14/2022, 12:15 PM   *Please note that this is a verbal dictation  therefore any spelling or grammatical errors are due to the "[place] [employer]" system interpretation.  Please page via [place] and do not message via secure chat for urgent patient care matters. Secure chat can be used for non urgent patient care matters.  How to contact the [facility] Attending or Consulting provider [place] or covering provider during after hours [place], for this patient?  Check the care team in [facility] and look for a) attending/consulting [facility] provider listed and b) the [facility] team listed. Page or secure chat [phone]. Log into [email or web address] and use [place]'s universal password to access. If you do not have the password, please contact the hospital operator. Locate the [facility] provider you are looking for under [facility] and page to a number that you can be directly reached. If you still have difficulty reaching the provider, please page the [facility] (Director on Call) for the Hospitalists listed on amion for assistance.

## 2022-05-14 NOTE — Progress Notes (Addendum)
Contacted [facility] admissions regarding update on pt's referral. Awaiting response.   Melven Sartorius Renal Navigator [phone]  Addendum at 1:50 pm: Pt accepted at [facility] on TTS 11:55 am chair time. Pt can start on Thursday and will need to arrive at 11:00 to complete paperwork prior to treatment. Attempted to reach pt via phone to discuss details due to navigator working remotely and pt did not answer. Update provided to attending, nephrologist, and RN CM. Will assist as needed.

## 2022-05-14 NOTE — Procedures (Signed)
Patient seen on Hemodialysis. BP 99/74 (BP Location: Left Arm)   Pulse 83   Temp 98.2 F (36.8 C) (Oral)   Resp 11   Wt 76.6 kg   SpO2 97%   BMI 29.91 kg/m   QB 250, UF goal 1L Tolerating treatment without complaints at this time.   Elmarie Shiley MD [facility]. Office # [phone] Pager # [phone] 9:00 AM

## 2022-05-14 NOTE — Progress Notes (Signed)
Patient ID: Paul [name], male   DOB: 1966/03/18, 57 y.o.   MRN: [id number] [facility] Progress Note   Assessment/ Plan:   1.  End-stage renal disease with progressive chronic kidney disease and presentation uremic symptoms.  Previously with refusal of dialysis as an outpatient with poor adherence to medications however, now agreed to a "trial of dialysis".  I appreciate assistance from interventional radiology with placement of right IJ [facility] yesterday via which he got his first dialysis treatment and is currently getting his second.  The process has been initiated for outpatient dialysis unit placement and the decision made to defer permanent access evaluation/placement for now (while he attempts to make a decision regarding continued dialysis).  Will order for a third dialysis treatment tomorrow 2.  Acute exacerbation of combined congestive heart failure: Mild and anticipate further improvement with volume unloading on hemodialysis. 3.  Anemia: Secondary to chronic kidney disease/chronic illness.  Denies overt blood loss.  On ESA and with mild iron deficiency for which I will start him on intravenous iron. 4.  Secondary hyperparathyroidism: With elevated phosphorus levels noted, monitor with hemodialysis and begin renal diet.  Started previously on calcium acetate for phosphorus binding. 5.  Hypertension: Poor adherence to oral and hypertensive therapy prior to admission.  Begin hemodialysis with ultrafiltration and revisit antihypertensive therapy.  Subjective:   Without acute events overnight and tolerated his first dialysis treatment yesterday.  Expresses some mild frustration of "sitting around doing nothing".   Objective:   BP 99/74 (BP Location: Left Arm)   Pulse 83   Temp 98.2 F (36.8 C) (Oral)   Resp 11   Wt 76.6 kg   SpO2 97%   BMI 29.91 kg/m   Intake/Output Summary (Last 24 hours) at 05/14/2022 0900 Last data filed at 05/14/2022 0743 Gross per 24 hour  Intake  1040 ml  Output 1500 ml  Net -460 ml   Weight change: -0.366 kg  Physical Exam: Gen: Resting comfortably in dialysis via right IJ TDC CVS: Pulse regular rate and normal rhythm, S1 and S2 normal Resp: Anteriorly clear to auscultation, no rales or rhonchi.  Right IJ TDC connected to dialysis Abd: Soft, flat, nontender, bowel sounds normal Ext: No lower extremity edema palpable  Imaging: IR Fluoro Guide CV Line Right  Result Date: 05/13/2022 CLINICAL DATA:  End-stage renal disease and need for tunneled hemodialysis catheter placement. EXAM: TUNNELED CENTRAL VENOUS HEMODIALYSIS CATHETER PLACEMENT WITH ULTRASOUND AND FLUOROSCOPIC GUIDANCE ANESTHESIA/SEDATION: Moderate (conscious) sedation was employed during this procedure. A total of Versed 1.0 mg and Fentanyl 50 mcg was administered intravenously by radiology nursing. Moderate Sedation Time: 14 minutes. The patient's level of consciousness and vital signs were monitored continuously by radiology nursing throughout the procedure under my direct supervision. MEDICATIONS: 1 g IV vancomycin. FLUOROSCOPY: 18 seconds.  2.0 mGy. PROCEDURE: The procedure, risks, benefits, and alternatives were explained to the patient. Questions regarding the procedure were encouraged and answered. The patient understands and consents to the procedure. A timeout was performed prior to initiating the procedure. The right neck and chest were prepped with chlorhexidine in a sterile fashion, and a sterile drape was applied covering the operative field. Maximum barrier sterile technique with sterile gowns and gloves were used for the procedure. Local anesthesia was provided with 1% lidocaine. Ultrasound was used to confirm patency of the right internal jugular vein. A permanent ultrasound image was recorded. After creating a small venotomy incision, a 21 gauge needle was advanced into the right internal  jugular vein under direct, real-time ultrasound guidance. Ultrasound image  documentation was performed. After securing guidewire access, an 8 Fr dilator was placed. A J-wire was kinked to measure appropriate catheter length. A Palindrome tunneled hemodialysis catheter measuring 19 cm from tip to cuff was chosen for placement. This was tunneled in a retrograde fashion from the chest wall to the venotomy incision. At the venotomy, serial dilatation was performed and a 15 Fr peel-away sheath was placed over a guidewire. The catheter was then placed through the sheath and the sheath removed. Final catheter positioning was confirmed and documented with a fluoroscopic spot image. The catheter was aspirated, flushed with saline, and injected with appropriate volume heparin dwells. The venotomy incision was closed with subcuticular 4-0 Vicryl. Dermabond was applied to the incision. The catheter exit site was secured with 0-Prolene retention sutures. COMPLICATIONS: None.  No pneumothorax. FINDINGS: After catheter placement, the tip lies in the right atrium. The catheter aspirates normally and is ready for immediate use. IMPRESSION: Placement of tunneled hemodialysis catheter via the right internal jugular vein. The catheter tip lies in the right atrium. The catheter is ready for immediate use. Electronically Signed   By: Aletta Edouard M.D.   On: 05/13/2022 09:56   IR US Guide Vasc Access Right  Result Date: 05/13/2022 CLINICAL DATA:  End-stage renal disease and need for tunneled hemodialysis catheter placement. EXAM: TUNNELED CENTRAL VENOUS HEMODIALYSIS CATHETER PLACEMENT WITH ULTRASOUND AND FLUOROSCOPIC GUIDANCE ANESTHESIA/SEDATION: Moderate (conscious) sedation was employed during this procedure. A total of Versed 1.0 mg and Fentanyl 50 mcg was administered intravenously by radiology nursing. Moderate Sedation Time: 14 minutes. The patient's level of consciousness and vital signs were monitored continuously by radiology nursing throughout the procedure under my direct supervision.  MEDICATIONS: 1 g IV vancomycin. FLUOROSCOPY: 18 seconds.  2.0 mGy. PROCEDURE: The procedure, risks, benefits, and alternatives were explained to the patient. Questions regarding the procedure were encouraged and answered. The patient understands and consents to the procedure. A timeout was performed prior to initiating the procedure. The right neck and chest were prepped with chlorhexidine in a sterile fashion, and a sterile drape was applied covering the operative field. Maximum barrier sterile technique with sterile gowns and gloves were used for the procedure. Local anesthesia was provided with 1% lidocaine. Ultrasound was used to confirm patency of the right internal jugular vein. A permanent ultrasound image was recorded. After creating a small venotomy incision, a 21 gauge needle was advanced into the right internal jugular vein under direct, real-time ultrasound guidance. Ultrasound image documentation was performed. After securing guidewire access, an 8 Fr dilator was placed. A J-wire was kinked to measure appropriate catheter length. A Palindrome tunneled hemodialysis catheter measuring 19 cm from tip to cuff was chosen for placement. This was tunneled in a retrograde fashion from the chest wall to the venotomy incision. At the venotomy, serial dilatation was performed and a 15 Fr peel-away sheath was placed over a guidewire. The catheter was then placed through the sheath and the sheath removed. Final catheter positioning was confirmed and documented with a fluoroscopic spot image. The catheter was aspirated, flushed with saline, and injected with appropriate volume heparin dwells. The venotomy incision was closed with subcuticular 4-0 Vicryl. Dermabond was applied to the incision. The catheter exit site was secured with 0-Prolene retention sutures. COMPLICATIONS: None.  No pneumothorax. FINDINGS: After catheter placement, the tip lies in the right atrium. The catheter aspirates normally and is ready for  immediate use. IMPRESSION: Placement of tunneled  hemodialysis catheter via the right internal jugular vein. The catheter tip lies in the right atrium. The catheter is ready for immediate use. Electronically Signed   By: Aletta Edouard M.D.   On: 05/13/2022 09:56   ECHOCARDIOGRAM COMPLETE  Result Date: 05/12/2022    ECHOCARDIOGRAM REPORT   Patient Name:   Paul [name] Date of Exam: 05/12/2022 Medical Rec #:  [id number]         Height:       63.0 in Accession #:    [id number]        Weight:       183.8 lb Date of Birth:  1966-01-26         BSA:          1.865 m Patient Age:    65 years          BP:           165/122 mmHg Patient Gender: M                 HR:           84 bpm. Exam Location:  Inpatient Procedure: 2D Echo, Cardiac Doppler, Color Doppler and Intracardiac            Opacification Agent Indications:    CHF-acute systolic  History:        Patient has prior history of Echocardiogram examinations, most                 recent 05/13/2021. CHF; Risk Factors:Hypertension and                 Dyslipidemia. CKD.  Sonographer:    Clayton Lefort RDCS (AE) Referring Phys: Shela Leff  Sonographer Comments: Technically difficult study due to poor echo windows. Image acquisition challenging due to respiratory motion. IMPRESSIONS  1. Left ventricular ejection fraction, by estimation, is 45 to 50%. The left ventricle has mildly decreased function. The left ventricle has no regional wall motion abnormalities. There is moderate left ventricular hypertrophy. Left ventricular diastolic parameters are indeterminate.  2. Right ventricular systolic function was not well visualized. The right ventricular size is normal. Tricuspid regurgitation signal is inadequate for assessing PA pressure.  3. The mitral valve is grossly normal. No evidence of mitral valve regurgitation. No evidence of mitral stenosis.  4. The aortic valve was not well visualized. Aortic valve regurgitation is not visualized. No aortic stenosis is present.   5. The inferior vena cava is normal in size with <50% respiratory variability, suggesting right atrial pressure of 8 mmHg. Comparison(s): Prior images reviewed side by side. Changes from prior study are noted. LVEF improved from 30% to 45-50% now. FINDINGS  Left Ventricle: Left ventricular ejection fraction, by estimation, is 45 to 50%. The left ventricle has mildly decreased function. The left ventricle has no regional wall motion abnormalities. Definity contrast agent was given IV to delineate the left ventricular endocardial borders. The left ventricular internal cavity size was normal in size. There is moderate left ventricular hypertrophy. Left ventricular diastolic parameters are indeterminate. Right Ventricle: The right ventricular size is normal. Right vetricular wall thickness was not well visualized. Right ventricular systolic function was not well visualized. Tricuspid regurgitation signal is inadequate for assessing PA pressure. Left Atrium: Left atrial size was normal in size. Right Atrium: Right atrial size was normal in size. Pericardium: There is no evidence of pericardial effusion. Mitral Valve: The mitral valve is grossly normal. No evidence of mitral valve regurgitation. No evidence  of mitral valve stenosis. Tricuspid Valve: The tricuspid valve is not well visualized. Tricuspid valve regurgitation is not demonstrated. No evidence of tricuspid stenosis. Aortic Valve: The aortic valve was not well visualized. Aortic valve regurgitation is not visualized. No aortic stenosis is present. Aortic valve mean gradient measures 3.0 mmHg. Aortic valve peak gradient measures 5.2 mmHg. Aortic valve area, by VTI measures 1.63 cm. Pulmonic Valve: The pulmonic valve was not well visualized. Pulmonic valve regurgitation is not visualized. No evidence of pulmonic stenosis. Aorta: The aortic root and ascending aorta are structurally normal, with no evidence of dilitation. Venous: The inferior vena cava is normal in  size with less than 50% respiratory variability, suggesting right atrial pressure of 8 mmHg. IAS/Shunts: No atrial level shunt detected by color flow Doppler.  LEFT VENTRICLE PLAX 2D LVIDd:         5.30 cm      Diastology LVIDs:         4.40 cm      LV e' medial:    4.35 cm/s LV PW:         1.40 cm      LV E/e' medial:  14.1 LV IVS:        1.40 cm      LV e' lateral:   4.90 cm/s LVOT diam:     2.00 cm      LV E/e' lateral: 12.5 LV SV:         33 LV SV Index:   18 LVOT Area:     3.14 cm  LV Volumes (MOD) LV vol d, MOD A2C: 103.0 ml LV vol d, MOD A4C: 122.0 ml LV vol s, MOD A2C: 68.7 ml LV vol s, MOD A4C: 72.6 ml LV SV MOD A2C:     34.3 ml LV SV MOD A4C:     122.0 ml LV SV MOD BP:      42.4 ml RIGHT VENTRICLE         IVC TAPSE (M-mode): 2.5 cm  IVC diam: 1.90 cm LEFT ATRIUM           Index        RIGHT ATRIUM           Index LA diam:      3.50 cm 1.88 cm/m   RA Area:     16.40 cm LA Vol (A2C): 31.2 ml 16.73 ml/m  RA Volume:   41.40 ml  22.19 ml/m LA Vol (A4C): 62.9 ml 33.72 ml/m  AORTIC VALVE AV Area (Vmax):    1.59 cm AV Area (Vmean):   1.50 cm AV Area (VTI):     1.63 cm AV Vmax:           114.00 cm/s AV Vmean:          75.500 cm/s AV VTI:            0.201 m AV Peak Grad:      5.2 mmHg AV Mean Grad:      3.0 mmHg LVOT Vmax:         57.80 cm/s LVOT Vmean:        36.100 cm/s LVOT VTI:          0.104 m LVOT/AV VTI ratio: 0.52  AORTA Ao Root diam: 3.40 cm Ao Asc diam:  3.30 cm MITRAL VALVE MV Area (PHT): 3.27 cm    SHUNTS MV Decel Time: 232 msec    Systemic VTI:  0.10 m MV E velocity: 61.40 cm/s  Systemic Diam: 2.00 cm MV A velocity: 58.10 cm/s MV E/A ratio:  1.06 Vishnu Priya Mallipeddi Electronically signed by Lorelee Cover Mallipeddi Signature Date/Time: 05/12/2022/12:46:53 PM    Final     Labs: BMET Recent Labs  Lab 05/11/22 2342 05/12/22 0636 05/13/22 0035 05/14/22 0036  NA 140 140 138 136  K 4.5 5.1 3.8 3.5  CL 108 109 107 99  CO2 17* 17* 18* 23  GLUCOSE 107* 93 107* 104*  BUN 74* 87* 89* 58*   CREATININE 9.01* 9.09* 9.46* 6.95*  CALCIUM 5.7* 5.6* 6.4* 7.4*  PHOS  --  7.9*  --   --    CBC Recent Labs  Lab 05/11/22 2342 05/12/22 0636 05/12/22 1536 05/13/22 0035 05/14/22 0036  WBC 9.6 6.8  --  11.0* 10.6*  NEUTROABS  --   --   --  9.2* 8.4*  HGB 7.7* 5.9* 7.3* 7.3* 7.6*  HCT 24.0* 19.1* 21.9* 21.4* 23.0*  MCV 90.9 94.1  --  87.7 88.8  PLT 279 200  --  234 241    Medications:     calcitRIOL  0.5 mcg Oral Daily   calcium acetate  1,334 mg Oral TID WC   calcium carbonate  800 mg of elemental calcium Oral BID   carvedilol  6.25 mg Oral BID WC   Chlorhexidine Gluconate Cloth  6 each Topical Daily   darbepoetin (ARANESP) injection - DIALYSIS  60 mcg Subcutaneous Q Mon-1800   furosemide  40 mg Intravenous BID   hydrALAZINE  50 mg Oral Q8H   isosorbide mononitrate  60 mg Oral Daily   multivitamin  1 tablet Oral QHS   rosuvastatin  5 mg Oral Daily   Vitamin D (Ergocalciferol)  50,000 Units Oral Q Mon   Elmarie Shiley, MD 05/14/2022, 9:00 AM

## 2022-05-14 NOTE — Progress Notes (Signed)
     Referral previously received for Paul [name] for goals of care discussion. Noted most recent palliative in-person assessment dated 05/13/2022 at which time it was recommended to follow from a distance/chart check and reattempt [place] discussions after trial of a couple sessions of iHD.  Chart reviewed for Recent provider notes, nurse notes, TOC notes, vitals, and labs and updates received from RN.   At this time patient appears stable, seems to be tolerating iHD well but frustrated with "sitting around doing nothing" during treatment.  I received a message from the palliative team phone from [facility].  I attempted to call her back and left a voicemail.  By the end of the day I had not heard back.  Will continue attempts.  No plan for in person follow-up today. Please contact the palliative medicine provider on service for any new/urgent needs that require our assistance with this patient.  In the meantime, we will continue to shadow his chart for IHD tolerance to support goals of care meeting in the next couple days.  Thank you for your referral and allowing PMT to assist in [place] care.   Walden Field, NP Palliative Medicine Team Phone: [phone]  NO CHARGE

## 2022-05-14 NOTE — Plan of Care (Signed)
Pt went to dialysis today. Pt independently took a shower. 2pm hydralazine not given due to soft BP.  Problem: Education: Goal: Ability to demonstrate management of disease process will improve Outcome: Progressing Goal: Ability to verbalize understanding of medication therapies will improve Outcome: Progressing Goal: Individualized Educational Video(s) Outcome: Progressing   Problem: Activity: Goal: Capacity to carry out activities will improve Outcome: Progressing   Problem: Cardiac: Goal: Ability to achieve and maintain adequate cardiopulmonary perfusion will improve Outcome: Progressing   Problem: Education: Goal: Knowledge of General Education information will improve Description: Including pain rating scale, medication(s)/side effects and non-pharmacologic comfort measures Outcome: Progressing   Problem: Health Behavior/Discharge Planning: Goal: Ability to manage health-related needs will improve Outcome: Progressing   Problem: Clinical Measurements: Goal: Ability to maintain clinical measurements within normal limits will improve Outcome: Progressing Goal: Will remain free from infection Outcome: Progressing Goal: Diagnostic test results will improve Outcome: Progressing Goal: Respiratory complications will improve Outcome: Progressing Goal: Cardiovascular complication will be avoided Outcome: Progressing   Problem: Activity: Goal: Risk for activity intolerance will decrease Outcome: Progressing   Problem: Nutrition: Goal: Adequate nutrition will be maintained Outcome: Progressing   Problem: Coping: Goal: Level of anxiety will decrease Outcome: Progressing   Problem: Elimination: Goal: Will not experience complications related to bowel motility Outcome: Progressing Goal: Will not experience complications related to urinary retention Outcome: Progressing   Problem: Pain Managment: Goal: General experience of comfort will improve Outcome: Progressing    Problem: Safety: Goal: Ability to remain free from injury will improve Outcome: Progressing   Problem: Skin Integrity: Goal: Risk for impaired skin integrity will decrease Outcome: Progressing   Problem: Education: Goal: Knowledge of disease and its progression will improve Outcome: Progressing Goal: Individualized Educational Video(s) Outcome: Progressing   Problem: Fluid Volume: Goal: Compliance with measures to maintain balanced fluid volume will improve Outcome: Progressing   Problem: Health Behavior/Discharge Planning: Goal: Ability to manage health-related needs will improve Outcome: Progressing   Problem: Nutritional: Goal: Ability to make healthy dietary choices will improve Outcome: Progressing   Problem: Clinical Measurements: Goal: Complications related to the disease process, condition or treatment will be avoided or minimized Outcome: Progressing

## 2022-05-14 NOTE — Progress Notes (Signed)
Received patient in bed to unit.  Alert and oriented.  Informed consent signed and in chart.   Treatment initiated: 08:14 Treatment completed: 11:21  Patient tolerated well.  Transported back to the room  Alert, without acute distress.  Hand-off given to patient's nurse.   Access used: Right hemodialysis catheter Access issues: none  Total UF removed: 1L Medication(s) given: none   05/14/22 1121  Vitals  BP 121/80  MAP (mmHg) 86  BP Location Left Arm  BP Method Automatic  Patient Position (if appropriate) Lying  Pulse Rate 98  Pulse Rate Source Monitor  ECG Heart Rate (!) 101  Resp 15  Oxygen Therapy  SpO2 97 %  O2 Device Room Air  During Treatment Monitoring  Blood Flow Rate (mL/min) 200 mL/min  HD Safety Checks Performed Yes  Intra-Hemodialysis Comments Tx completed  Dialysis Fluid Bolus Normal Saline  Bolus Amount (mL) 300 mL      Cashtyn Pouliot [facility]

## 2022-05-15 DIAGNOSIS — I5033 Acute on chronic diastolic (congestive) heart failure: Secondary | ICD-10-CM | POA: Diagnosis not present

## 2022-05-15 DIAGNOSIS — Z515 Encounter for palliative care: Secondary | ICD-10-CM | POA: Diagnosis not present

## 2022-05-15 DIAGNOSIS — Z7189 Other specified counseling: Secondary | ICD-10-CM | POA: Diagnosis not present

## 2022-05-15 DIAGNOSIS — R040 Epistaxis: Secondary | ICD-10-CM | POA: Diagnosis not present

## 2022-05-15 DIAGNOSIS — I10 Essential (primary) hypertension: Secondary | ICD-10-CM | POA: Diagnosis not present

## 2022-05-15 DIAGNOSIS — N179 Acute kidney failure, unspecified: Secondary | ICD-10-CM | POA: Diagnosis not present

## 2022-05-15 DIAGNOSIS — F209 Schizophrenia, unspecified: Secondary | ICD-10-CM

## 2022-05-15 DIAGNOSIS — D649 Anemia, unspecified: Secondary | ICD-10-CM

## 2022-05-15 LAB — CBC WITH DIFFERENTIAL/PLATELET
Abs Immature Granulocytes: 0.05 10*3/uL (ref 0.00–0.07)
Basophils Absolute: 0 10*3/uL (ref 0.0–0.1)
Basophils Relative: 0 %
Eosinophils Absolute: 0.2 10*3/uL (ref 0.0–0.5)
Eosinophils Relative: 2 %
HCT: 23.6 % — ABNORMAL LOW (ref 39.0–52.0)
Hemoglobin: 8.1 g/dL — ABNORMAL LOW (ref 13.0–17.0)
Immature Granulocytes: 1 %
Lymphocytes Relative: 10 %
Lymphs Abs: 1 10*3/uL (ref 0.7–4.0)
MCH: 30.2 pg (ref 26.0–34.0)
MCHC: 34.3 g/dL (ref 30.0–36.0)
MCV: 88.1 fL (ref 80.0–100.0)
Monocytes Absolute: 1.7 10*3/uL — ABNORMAL HIGH (ref 0.1–1.0)
Monocytes Relative: 16 %
Neutro Abs: 7.6 10*3/uL (ref 1.7–7.7)
Neutrophils Relative %: 71 %
Platelets: 236 10*3/uL (ref 150–400)
RBC: 2.68 MIL/uL — ABNORMAL LOW (ref 4.22–5.81)
RDW: 13.2 % (ref 11.5–15.5)
WBC: 10.6 10*3/uL — ABNORMAL HIGH (ref 4.0–10.5)
nRBC: 0 % (ref 0.0–0.2)

## 2022-05-15 LAB — BASIC METABOLIC PANEL
Anion gap: 14 (ref 5–15)
BUN: 57 mg/dL — ABNORMAL HIGH (ref 6–20)
CO2: 26 mmol/L (ref 22–32)
Calcium: 7.3 mg/dL — ABNORMAL LOW (ref 8.9–10.3)
Chloride: 95 mmol/L — ABNORMAL LOW (ref 98–111)
Creatinine, Ser: 6.12 mg/dL — ABNORMAL HIGH (ref 0.61–1.24)
GFR, Estimated: 10 mL/min — ABNORMAL LOW (ref >60)
Glucose, Bld: 103 mg/dL — ABNORMAL HIGH (ref 70–99)
Potassium: 3.8 mmol/L (ref 3.5–5.1)
Sodium: 135 mmol/L (ref 135–145)

## 2022-05-15 MED ORDER — ALTEPLASE 2 MG IJ SOLR
INTRAMUSCULAR | Status: AC
  Filled 2022-05-15: qty 2

## 2022-05-15 MED ORDER — ORAL CARE MOUTH RINSE: 15.0000 mL | OROMUCOSAL | Status: DC | PRN

## 2022-05-15 MED ORDER — ALTEPLASE 2 MG IJ SOLR
2.0000 mg | Freq: Once | INTRAMUSCULAR | Status: AC
  Administered 2022-05-15: 2 mg
  Filled 2022-05-15: qty 2

## 2022-05-15 MED ORDER — ALTEPLASE 2 MG IJ SOLR
INTRAMUSCULAR | Status: AC
  Administered 2022-05-15: 2 mg
  Filled 2022-05-15: qty 4

## 2022-05-15 MED ORDER — HEPARIN SODIUM (PORCINE) 1000 UNIT/ML IJ SOLN
INTRAMUSCULAR | Status: AC
  Administered 2022-05-15: 1000 [IU]
  Filled 2022-05-15: qty 4

## 2022-05-15 MED ORDER — ALTEPLASE 2 MG IJ SOLR
2.0000 mg | Freq: Once | INTRAMUSCULAR | Status: AC
  Administered 2022-05-15: 2 mg

## 2022-05-15 NOTE — Progress Notes (Signed)
Pt HD catheter is running at 250 BFR after instilling Cathflo. Md Posey Pronto informed Dialysis RN to Locked pt. HD catheter with Cathflo and tomorrow at the Dialysis Unit they will removed the Cathflo.

## 2022-05-15 NOTE — Hospital Course (Addendum)
Paul [name] was admitted to the hospital with acute on chronic systolic heart failure with progressive renal disease, now on Hemodialysis.   57 yo male with the past medical history of heart failure, hypertension, CKD, chronic anemia and schizophrenia who presented with epistaxis. Apparently patient stopped taking all his medications for the last 5 days. He developed spontaneous profuse epistaxis that prompted him to come to the hospital. He reported several months of dyspnea and cough. On his initial physical examination his blood pressure was 200/120s, HR 120, RR 22, and 02 saturation 99%, lungs with no wheezing, heart with S1 and S2 present and rhythmic, abdomen with no distention, no lower extremity edema.   Na 140, K 4,5 CL 108 bicarbonate 17 glucose 107 bun 74 cr 9,0 BNP 1,590  High sensitive troponin 158 and 153  Wbc 9,6 hgb 7,7 plt 279  Urine analysis SG 1,010, 100 protein, negative leukocytes.  Chest radiograph with cardiomegaly, bilateral hilar vascular congestion and bilateral interstitial infiltrates.   EKG 96 bpm, normal axis, normal intervals, sinus rhythm with no ST segment changes, negative T wave V4 to V6.   ENT placed packing on right nostril with good toleration.  Required one unit PRBC transfusion due to acute blood loss anemia.  Patient placed on IV furosemide for diuresis.  He had worsening renal function and required renal replacement therapy.   01/11 Patient now on HD, pending outpatient transportation arrangements in order to go home.  01/12 Patient will go to outpatient HD by bus until further arrangements can be arranged.

## 2022-05-15 NOTE — Assessment & Plan Note (Signed)
Continue blood pressure control with carvedilol, hydralazine and isosorbide.  

## 2022-05-15 NOTE — Assessment & Plan Note (Signed)
Follow up as outpatient.  

## 2022-05-15 NOTE — Progress Notes (Signed)
  Progress Note   Patient: Paul [name]:[id number] DOB: 1965-08-30 DOA: 05/11/2022     3 DOS: the patient was seen and examined on 05/15/2022   Brief hospital course: Mr. Paul [name] was admitted to the hospital with acute on chronic systolic heart failure with progressive renal disease, now on Hemodialysis.   57 yo male with the past medical history of heart failure, hypertension, CKD, chronic anemia and schizophrenia who presented with epistaxis. Apparently patient stopped taking all his medications for the last 5 days. He developed spontaneous profuse epistaxis that prompted him to come to the hospital. He reported several months of dyspnea and cough. On his initial physical examination his blood pressure was 200/120s, HR 120, RR 22, and 02 saturation 99%, lungs with no wheezing, heart with S1 and S2 present and rhythmic, abdomen with no distention, no lower extremity edema.   ENT placed packing on right nostril with good toleration.  Required one unit PRBC transfusion due to acute blood loss anemia.  Patient placed on IV furosemide for diuresis.  He had worsening renal function and required renal replacement therapy.   Assessment and Plan: * Acute on chronic diastolic CHF (congestive heart failure) (HCC) Echocardiogram with preserved LV systolic function 45 to 50 (mild decreased). Moderate LVH. No significant valvular disease.   His volume has improved with renal replacement therapy.  Plan to continue with carvedilol and isosorbide.  Will discontinue furosemide.   Acute kidney injury superimposed on chronic kidney disease ([place]) CKD stage IV that has progressed to stage V.  Patient with progressive worsening renal function.  Right IJ TDC placed on 01/08 Patient had HD 01/08, 01/09 and 01/10.   Follow up nephrology recommendations for outpatient HD.   Anemia of chronic renal disease, worsened by epistaxis,  Sp 1 unit PRBC transfusion.  Continue EPO.  Metabolic bone disease,  continue with calcitriol.    Epistaxis No further bleeding Nasal packing from right nostril has been removed with no complications.    Essential hypertension Continue blood pressure control with carvedilol, hydralazine and isosorbide.   Schizophrenia ([place]) Follow up as outpatient.         Subjective: Patient with no chest pain or dyspnea, no further epistaxis 3  Physical Exam: Vitals:   05/15/22 1130 05/15/22 1200 05/15/22 1234 05/15/22 1330  BP: (!) 123/106 124/76 129/80 114/75  Pulse: (!) 102 (!) 104 (!) 103 (!) 101  Resp: 13 19 (!) 22 20  Temp:   98.2 F (36.8 C) 98.4 F (36.9 C)  TempSrc:   Oral Oral  SpO2: 98% 99% 97% 98%  Weight:   77.5 kg    Neurology awake and alert ENT with right nasal packing that has been removed Cardiovascular with S1 and S2 present and rhythmic, with no gallops, rubs or murmurs Respiratory with no rales or wheezing, no rhonchi Abdomen with no distention  No lower extremity edema  Right IJ tunneled catheter in place  Data Reviewed:    Family Communication: no family at the bedside   Disposition: Status is: Inpatient Remains inpatient appropriate because: pending outpatient HD   Planned Discharge Destination: Home    Author: Tawni Millers, MD 05/15/2022 3:35 PM  For on call review [email or web address].

## 2022-05-15 NOTE — Progress Notes (Signed)
Daily Progress Note   Patient Name: KAVEON BLATZ       Date: 05/15/2022 DOB: 12-17-1965  Age: 57 y.o. MRN#: [id number] Attending Physician: Tawni Millers Primary Care Physician: Ladell Pier, MD Admit Date: 05/11/2022 Length of Stay: 3 days  Reason for Consultation/Follow-up: Establishing goals of care  HPI/Patient Profile:  57 y.o. male  with past medical history of  chronic combined CHF, hypertension, CKD stage IV, anemia of chronic disease, hyperlipidemia, schizophrenia, right eye blindness admitted on 05/11/2022 with epistaxis.    Patient states he stopped taking all of his home medications 5 months ago. He is admitted for acute on chronic CHF, with echo showing improvement of EF from 30-35% to , AKI on CKD stage IV, acute on chronic anemia secondary to blood loss, hypertensive urgency, severe hypocalcemia.   He has agreed to HD, although has been very opposed in the past (as recently as May 2023). PMT has been consulted to assist with goals of care conversation.  Subjective:   Subjective: Chart Reviewed. Updates received. Patient Assessed. Created space and opportunity for patient  and family to explore thoughts and feelings regarding current medical situation.  Today's Discussion: Today I saw the patient at the bedside.  No family is present.  I previously spoken with the sister earlier (see details below).  We discussed plans for discharge, likely tomorrow.  He has agreed to outpatient hemodialysis and the nephrology nurse navigator has set him up for this.  They are also attempting to arrange transportation and discharge as well as transportation to and from outpatient dialysis.  He is agreeable to dialysis.  We discussed severe chronic illness such as kidney failure requiring dialysis.  I offered, and he excepted, referral to outpatient palliative care for ongoing support due to serious illness.  We discussed that this is not hospice but he can continue treatment  attempts and palliative can help support him in this as well as work with symptom management.  If he gets to a point where he wants to stop dialysis or he continues to get sicker, outpatient palliative care can help him decide amongst other options for ongoing care.  He currently denies any pain, nausea, vomiting, dyspnea.  No other complaints.  Denies any other needs at this time.  I previously spoke with his sister Shann Medal and her husband Clair Gulling.  Her concerns are whether her brother will continue outpatient dialysis ongoing.  We discussed the role of outpatient palliative care where if he decides to stop dialysis they can help him transition to hospice if he becomes sicker and no longer wants treatment.  She appreciates this and thinks it would be a good idea for him.  I provided emotional and general support through therapeutic listening, empathy, sharing of stories, and other techniques. I answered all questions and addressed all concerns to the best of my ability.  Review of Systems  Respiratory:  Negative for cough and shortness of breath.   Cardiovascular:  Negative for chest pain.  Gastrointestinal:  Negative for abdominal pain, nausea and vomiting.    Objective:   Vital Signs:  BP 114/75 (BP Location: Right Arm)   Pulse (!) 101   Temp 98.4 F (36.9 C) (Oral)   Resp 20   Wt 77.5 kg   SpO2 98%   BMI 30.27 kg/m   Physical Exam: Physical Exam Vitals and nursing note reviewed.  Constitutional:      General: He is not in acute distress.    Appearance:  Normal appearance. He is not ill-appearing.  HENT:     Head: Normocephalic and atraumatic.  Cardiovascular:     Rate and Rhythm: Normal rate.  Pulmonary:     Effort: Pulmonary effort is normal. No respiratory distress.  Abdominal:     General: Abdomen is protuberant.  Skin:    General: Skin is warm and dry.  Neurological:     General: No focal deficit present.     Mental Status: He is oriented to person, place, and  time.  Psychiatric:        Mood and Affect: Mood normal.        Behavior: Behavior normal.     Palliative Assessment/Data: 70%    Existing Vynca/ACP Documentation: None  Assessment & Plan:   Impression: Present on Admission:  Acute kidney injury superimposed on chronic kidney disease (HCC)  Elevated troponin  SUMMARY OF RECOMMENDATIONS   Continue full code Full scope of care Anticipate discharge tomorrow Plans for outpatient hemodialysis TOC consult for referral to outpatient palliative care Goals are clear at this time PMT will back off Please notify us of significant clinical change or new palliative needs  Symptom Management:  Per primary team PMT is available to assist as needed  Code Status: Full code  Prognosis: Unable to determine  Discharge Planning: To Be Determined  Discussed with: Patient, family, medical team, nursing team  Thank you for allowing [place] to participate in the care of Kamden Stanislaw Vila PMT will continue to support holistically.  Time Total: 60 min  Visit consisted of counseling and education dealing with the complex and emotionally intense issues of symptom management and palliative care in the setting of serious and potentially life-threatening illness. Greater than 50%  of this time was spent counseling and coordinating care related to the above assessment and plan.  Walden Field, NP Palliative Medicine Team  Team Phone # [phone] (Nights/Weekends)  01/02/2021, 8:17 AM

## 2022-05-15 NOTE — Progress Notes (Signed)
New Dialysis Start    Patient identified as new dialysis start. Kidney Education packet assembled and given. Discussed the following items with patient:     Current medications and possible changes once started:  Discussed that patient's medications may change over time.  Ex; hypertension medications and diabetes medication.  Nephrologists will adjust as needed.   Fluid restrictions reviewed:  32 oz daily goal:  All liquids count; soups, ice, jello.    Phosphorus and potassium: Handout given showing high potassium and phosphorus foods.  Alternative food and drink options given.   Family support:  None at bedside   Outpatient Clinic Resources:  Discussed roles of Outpatient clinic staff and advised to make a list of needs, if any, to talk with outpatient staff if needed.   Care plan schedule: Informed patient of Care Plans in outpatient setting and to participate in the care plan.  An invitation would be given from outpatient clinic.    Dialysis Access Options:  Reviewed access options with patients. Discussed in detail about care at home with new AVG & AVF. Reviewed checking bruit and thrill. If dialysis catheter present, educated that patient could not take showers.  Catheter dressing changes were to be done by outpatient clinic staff only.   Home therapy options:  Educated patient about home therapy options:  PD vs home hemo.     Patient verbalized understanding. Will continue to round on patient during admission.    Aurther Loft Dialysis Nurse Coordinator [phone]

## 2022-05-15 NOTE — Progress Notes (Signed)
Pt. Line is not working after many attempts of reversing line and flushing HD catheter. MD Posey Pronto said to give blood back and insert Cathflo and indwelling for 30 minutes and pt. Remains in Dialysis.

## 2022-05-15 NOTE — Progress Notes (Signed)
   05/15/22 1244  Pain Assessment  Pain Scale 0-10  Pain Score 0  Hemodialysis Catheter Right Internal jugular Double lumen Permanent (Tunneled)  Placement Date/Time: 05/13/22 0908   Serial / Lot #: [id number]  Expiration Date: 11/05/26  Time Out: Correct patient;Correct site;Correct procedure  Maximum sterile barrier precautions: Hand hygiene;Cap;Mask;Sterile gown;Sterile gloves;Large sterile ...  Site Condition No complications  Blue Lumen Status Flushed;Heparin locked  Red Lumen Status Flushed;Heparin locked  Purple Lumen Status N/A  Catheter fill solution Heparin 1000 units/ml  Catheter fill volume (Arterial) 1.6 cc  Catheter fill volume (Venous) 1.6  Dressing Type Transparent  Dressing Status Clean, Dry, Intact  Post treatment catheter status Capped and Clamped  Neurological  Level of Consciousness Alert  Orientation Level Oriented X4  Respiratory  Respiratory Pattern Regular;Unlabored  Chest Assessment Chest expansion symmetrical  Bilateral Breath Sounds Clear;Diminished  Cardiac  Pulse Regular  Heart Sounds S1, S2  Jugular Venous Distention (JVD) No  ECG Monitor Yes  Cardiac Rhythm NSR;ST  Ectopy PAC  Ectopy Frequency Occasional   Received patient in bed to unit.  Alert and oriented.  Informed consent signed and in chart.   Treatment initiated: 0809 Treatment completed: 1234  Patient tolerated well.  Transported back to the room  Alert, without acute distress.  Hand-off given to patient's nurse.   Access used: Yes Access issues: Yes, HD catheter was reversed and flushed many times, applied Cathflo to both ports, had to turn BFR down to 250  Total UF removed: 1000 Medication(s) given: Yes Post HD VS: 98.2, 103, 129/80, 15, R/A 100 Post HD weight: 77.5 kg   [facility]

## 2022-05-15 NOTE — Assessment & Plan Note (Addendum)
CKD stage IV that has progressed to stage V.  Patient with progressive worsening renal function.  Right IJ TDC placed on 01/08 Patient had HD 01/08, 01/09, 01/10 and 01/12.   Outpatient HD, schedule will be Tues, Thurs, Sat.   Anemia of chronic renal disease, worsened by epistaxis,  Sp 1 unit PRBC transfusion.  Continue EPO.  Metabolic bone disease, continue with calcitriol.   Pending outpatient HD transportation arrangements.

## 2022-05-15 NOTE — Procedures (Signed)
Patient seen on Hemodialysis.  BP 96/64 (BP Location: Right Arm)   Pulse (!) 103   Temp 97.7 F (36.5 C) (Oral)   Resp 16   Wt 76.3 kg   SpO2 97%   BMI 29.80 kg/m   QB 300, UF goal 1L Tolerating treatment with catheter problems/limited flows- will lock catheter with Alteplase for 39min and restart HD.   He has an OP HD unit and can be discharged home today after dialysis to start tomorrow (TTS @[facility] )  Elmarie Shiley MD [facility]. Office # [phone] Pager # [phone] 9:26 AM

## 2022-05-15 NOTE — Plan of Care (Signed)
Nutrition Education Note  RD consulted for Renal Education. Provided "Nutrition for People on Dialysis" handout to patient. He has limited eyesight, so reviewed this handout with him well. Reviewed foods containing potassium and phosphorus within patient's current diet.   Patient's dietary recall includes: 1 big meal per day and multiple smaller snacks. He endorses eating [place], chicken and a variety of other meats. He states that he usually enjoys eating watermelon especially for the water content. Patient reports drinking dark colas. Educated that these contain higher phosphorus and provided alternative beverage options.   Explained why diet restrictions are needed and provided lists of foods to limit/avoid that are high potassium, sodium, and phosphorus. Provided specific recommendations on safer alternatives of these foods. Strongly encouraged compliance of this diet.   Discussed importance of protein intake at each meal and snack. Provided examples of how to maximize protein intake throughout the day. Discussed need for fluid restriction with dialysis, importance of minimizing weight gain between HD treatments, and renal-friendly beverage options.  Encouraged pt to discuss specific diet questions/concerns with RD at HD outpatient facility. Teach back method used.  Expect fair compliance. Patient would benefit from ongoing outpatient nutrition reinforcement as he reports that this is "all new for him" and he would appreciate someone to continue to follow up with him.   Body mass index is 30.27 kg/m. Pt meets criteria for overweight based on current BMI.  Current diet order is Renal with 1219ml fluid restriction, patient is consuming approximately 100% of meals at this time. Labs and medications reviewed. No further nutrition interventions warranted at this time. RD contact information provided. If additional nutrition issues arise, please re-consult RD.  Clayborne Dana, RDN, LDN Clinical  Nutrition

## 2022-05-15 NOTE — Assessment & Plan Note (Signed)
No further bleeding Nasal packing from right nostril has been removed with no complications.

## 2022-05-15 NOTE — Progress Notes (Signed)
Met with pt this morning while pt receiving HD. Discussed out-pt HD arrangements and pt provided schedule letter with details noted. Pt agreeable to plans. Pt requesting to speak to a chaplin for prayer and support. Pt also requesting assistance with transportation to HD at d/c as well. Contacted attending, nephrologist,RN CM, and CSW with pt's requests. Pt can start at [facility] tomorrow if stable for d/c today. If pt not ready for d/c today, then will need to confirm with clinic if pt can start on Saturday. Will assist as needed.   Melven Sartorius Renal Navigator [phone]

## 2022-05-15 NOTE — TOC Progression Note (Signed)
Transition of Care [facility]) - Progression Note    Patient Details  Name: Paul [name] MRN: [id number] Date of Birth: 1966/01/27  Transition of Care [facility]) CM/SW [place], LCSW Phone Number: 05/15/2022, 3:16 PM  Clinical Narrative:    CSW spoke with pt regarding transportation needs to HD. CSW completed an [employer] [place] transportation application to potentially assist pt in transportation for HD appointments. Pt verbally gave CSW his consent to complete this application. CSW emailed this application to [email or web address]. CSW will keep patient updated on this process. TOC will continue to follow.         Expected Discharge Plan and Services                                               Social Determinants of Health (SDOH) Interventions SDOH Screenings   Food Insecurity: No Food Insecurity (05/12/2022)  Housing: Low Risk  (05/12/2022)  Transportation Needs: No Transportation Needs (05/12/2022)  Utilities: Not At Risk (05/12/2022)  Depression (PHQ2-9): Low Risk  (06/07/2021)  Tobacco Use: Medium Risk (05/13/2022)    Readmission Risk Interventions    06/05/2021   10:09 AM  Readmission Risk Prevention Plan  Post Dischage Appt Complete  Medication Screening Complete  Transportation Screening Complete   Beckey Rutter, MSW, LCSWA, LCASA Transitions of Care  Clinical Social Worker I

## 2022-05-15 NOTE — Assessment & Plan Note (Signed)
Echocardiogram with preserved LV systolic function 45 to 50 (mild decreased). Moderate LVH. No significant valvular disease.   His volume has improved with renal replacement therapy.  Plan to continue with carvedilol and isosorbide.  Will discontinue furosemide.

## 2022-05-16 DIAGNOSIS — R040 Epistaxis: Secondary | ICD-10-CM | POA: Diagnosis not present

## 2022-05-16 DIAGNOSIS — N179 Acute kidney failure, unspecified: Secondary | ICD-10-CM | POA: Diagnosis not present

## 2022-05-16 DIAGNOSIS — I5033 Acute on chronic diastolic (congestive) heart failure: Secondary | ICD-10-CM | POA: Diagnosis not present

## 2022-05-16 DIAGNOSIS — I10 Essential (primary) hypertension: Secondary | ICD-10-CM | POA: Diagnosis not present

## 2022-05-16 LAB — RENAL FUNCTION PANEL
Albumin: 2.9 g/dL — ABNORMAL LOW (ref 3.5–5.0)
Anion gap: 13 (ref 5–15)
BUN: 51 mg/dL — ABNORMAL HIGH (ref 6–20)
CO2: 26 mmol/L (ref 22–32)
Calcium: 7.4 mg/dL — ABNORMAL LOW (ref 8.9–10.3)
Chloride: 96 mmol/L — ABNORMAL LOW (ref 98–111)
Creatinine, Ser: 5.99 mg/dL — ABNORMAL HIGH (ref 0.61–1.24)
GFR, Estimated: 10 mL/min — ABNORMAL LOW (ref >60)
Glucose, Bld: 97 mg/dL (ref 70–99)
Phosphorus: 5.2 mg/dL — ABNORMAL HIGH (ref 2.5–4.6)
Potassium: 3.8 mmol/L (ref 3.5–5.1)
Sodium: 135 mmol/L (ref 135–145)

## 2022-05-16 MED ORDER — SODIUM CHLORIDE 0.9 % IV SOLN
250.0000 mg | Freq: Every day | INTRAVENOUS | Status: AC
  Administered 2022-05-16 – 2022-05-17 (×2): 250 mg via INTRAVENOUS
  Filled 2022-05-16 (×2): qty 20

## 2022-05-16 NOTE — Progress Notes (Signed)
Patient ID: Paul [name], male   DOB: [birth year], 57 y.o.   MRN: [id number] [facility] Progress Note   Assessment/ Plan:   1.  End-stage renal disease with progressive chronic kidney disease and presentation uremic symptoms.  Previously with refusal of dialysis as an outpatient with poor adherence to medications however, now agreed to a "trial of dialysis". He had placement of a right IJ [facility] by interventional radiology on 05/13/2022 and the plan for permanent access is deferred as he attempts to make his decision regarding continuing dialysis.  He has been accepted to [place] hemodialysis unit on a Tuesday/Thursday/Saturday schedule with the ability to start there today if discharged home in time.  He is being set up for assistance with transportation by case management.  If he is not discharged home today, the plan is to undertake dialysis tomorrow prior to his discharge home in case he cannot get his initial dialysis on Saturday at the outpatient kidney center. 2.  Acute exacerbation of combined congestive heart failure: Mild and improving with volume unloading on hemodialysis. 3.  Anemia: Secondary to chronic kidney disease/chronic illness.  Denies overt blood loss.  On ESA and with mild iron deficiency.  4.  Secondary hyperparathyroidism: With elevated phosphorus levels noted, monitor with hemodialysis and begin renal diet.  Started previously on calcium acetate for phosphorus binding. 5.  Hypertension: Poor adherence to oral and hypertensive therapy prior to admission.  Begin hemodialysis with ultrafiltration and revisit antihypertensive therapy.  Subjective:   Informs me that he is motivated to take better care of his health and learned a lot from meeting with a dietitian yesterday.  We discussed that his hemodialysis is going to be a long term commitment.   Objective:   BP 129/78 (BP Location: Left Arm)   Pulse 84   Temp 98.2 F (36.8 C) (Oral)   Resp 18   Ht 5\' 3"   (1.6 m)   Wt 76.6 kg   SpO2 99%   BMI 29.91 kg/m   Intake/Output Summary (Last 24 hours) at 05/16/2022 0850 Last data filed at 05/16/2022 0800 Gross per 24 hour  Intake 718 ml  Output 1000 ml  Net -282 ml   Weight change: -0.3 kg  Physical Exam: Gen: Resting comfortably on the side of his bed CVS: Pulse regular rate and normal rhythm, S1 and S2 normal Resp: Anteriorly clear to auscultation, no rales or rhonchi.  Right IJ TDC in place Abd: Soft, flat, nontender, bowel sounds normal Ext: No lower extremity edema palpable  Imaging: No results found.  Labs: BMET Recent Labs  Lab 05/11/22 2342 05/12/22 0636 05/13/22 0035 05/14/22 0036 05/15/22 0028 05/16/22 0158  NA 140 140 138 136 135 135  K 4.5 5.1 3.8 3.5 3.8 3.8  CL 108 109 107 99 95* 96*  CO2 17* 17* 18* 23 26 26   GLUCOSE 107* 93 107* 104* 103* 97  BUN 74* 87* 89* 58* 57* 51*  CREATININE 9.01* 9.09* 9.46* 6.95* 6.12* 5.99*  CALCIUM 5.7* 5.6*  5.8* 6.4* 7.4* 7.3* 7.4*  PHOS  --  7.9*  --   --   --  5.2*   CBC Recent Labs  Lab 05/12/22 0636 05/12/22 1536 05/13/22 0035 05/14/22 0036 05/15/22 0028  WBC 6.8  --  11.0* 10.6* 10.6*  NEUTROABS  --   --  9.2* 8.4* 7.6  HGB 5.9* 7.3* 7.3* 7.6* 8.1*  HCT 19.1* 21.9* 21.4* 23.0* 23.6*  MCV 94.1  --  87.7 88.8 88.1  PLT 200  --  234 241 236    Medications:     calcitRIOL  0.5 mcg Oral Daily   calcium acetate  1,334 mg Oral TID WC   calcium carbonate  800 mg of elemental calcium Oral BID   carvedilol  6.25 mg Oral BID WC   Chlorhexidine Gluconate Cloth  6 each Topical Daily   darbepoetin (ARANESP) injection - DIALYSIS  60 mcg Subcutaneous Q Mon-1800   hydrALAZINE  25 mg Oral Q8H   isosorbide mononitrate  30 mg Oral Daily   multivitamin  1 tablet Oral QHS   rosuvastatin  5 mg Oral Daily   Vitamin D (Ergocalciferol)  50,000 Units Oral Q Mon   Elmarie Shiley, MD 05/16/2022, 8:50 AM

## 2022-05-16 NOTE — TOC Progression Note (Signed)
Transition of Care [facility]) - Progression Note    Patient Details  Name: MARKEVION LATTIN MRN: [id number] Date of Birth: 1965-09-15  [facility] CM/SW [place], LCSW Phone Number: 05/16/2022, 2:45 PM  Clinical Narrative:    CSW spoke with pt regarding transportation to HD. Pt informed CSW that he doesn't have family who could take him to and from his HD appointments and would take the bus. CSW communicated with pt that the [place] application has been submitted and is waiting a response.         Expected Discharge Plan and Services                                               Social Determinants of Health (SDOH) Interventions SDOH Screenings   Food Insecurity: No Food Insecurity (05/12/2022)  Housing: Low Risk  (05/12/2022)  Transportation Needs: No Transportation Needs (05/12/2022)  Utilities: Not At Risk (05/12/2022)  Depression (PHQ2-9): Low Risk  (06/07/2021)  Tobacco Use: Medium Risk (05/13/2022)    Readmission Risk Interventions    06/05/2021   10:09 AM  Readmission Risk Prevention Plan  Post Dischage Appt Complete  Medication Screening Complete  Transportation Screening Complete   Beckey Rutter, MSW, LCSWA, LCASA Transitions of Care  Clinical Social Worker I

## 2022-05-16 NOTE — Progress Notes (Signed)
  Progress Note   Patient: Paul [name] XNT:[id number] DOB: [birth year] DOA: 05/11/2022     4 DOS: the patient was seen and examined on 05/16/2022   Brief hospital course: Paul [name] was admitted to the hospital with acute on chronic systolic heart failure with progressive renal disease, now on Hemodialysis.   57 yo male with the past medical history of heart failure, hypertension, CKD, chronic anemia and schizophrenia who presented with epistaxis. Apparently patient stopped taking all his medications for the last 5 days. He developed spontaneous profuse epistaxis that prompted him to come to the hospital. He reported several months of dyspnea and cough. On his initial physical examination his blood pressure was 200/120s, HR 120, RR 22, and 02 saturation 99%, lungs with no wheezing, heart with S1 and S2 present and rhythmic, abdomen with no distention, no lower extremity edema.   ENT placed packing on right nostril with good toleration.  Required one unit PRBC transfusion due to acute blood loss anemia.  Patient placed on IV furosemide for diuresis.  He had worsening renal function and required renal replacement therapy.   01/11 Patient now on HD, pending outpatient transportation arrangements in order to go home.   Assessment and Plan: * Acute on chronic diastolic CHF (congestive heart failure) (HCC) Echocardiogram with preserved LV systolic function 45 to 50 (mild decreased). Moderate LVH. No significant valvular disease.   His volume has improved with renal replacement therapy.  Plan to continue with carvedilol and isosorbide.    Acute kidney injury superimposed on chronic kidney disease ([place]) CKD stage IV that has progressed to stage V.  Patient with progressive worsening renal function.  Right IJ TDC placed on 01/08 Patient had HD 01/08, 01/09 and 01/10.   Follow up nephrology recommendations for outpatient HD, schedule will be Tues, Thurs, Sat.  For HD tomorrow inpatient.    Anemia of chronic renal disease, worsened by epistaxis,  Sp 1 unit PRBC transfusion.  Continue EPO.  Metabolic bone disease, continue with calcitriol.   Pending outpatient HD transportation arrangements.   Epistaxis No further bleeding Nasal packing from right nostril has been removed with no complications.    Essential hypertension Continue blood pressure control with carvedilol, hydralazine and isosorbide.   Schizophrenia ([place]) Follow up as outpatient.         Subjective: Patient is feeling better, no dyspnea or chest pain   Physical Exam: Vitals:   05/16/22 0531 05/16/22 0717 05/16/22 1108 05/16/22 1658  BP: 120/80 129/78 (!) 128/90 (!) 144/89  Pulse: 84  77 79  Resp: 18 18  18   Temp: 99 F (37.2 C) 98.2 F (36.8 C) 98.2 F (36.8 C)   TempSrc: Oral Oral Oral   SpO2: 98% 99% 97% 98%  Weight:      Height:       Neurology awake and alert ENT with mild pallor Cardiovascular with S1 and S2 present and rhythmic Respiratory with no rales or wheezing Abdomen with no distention  No lower extremity edema  Data Reviewed:    Family Communication: no family at the bedside   Disposition: Status is: Inpatient Remains inpatient appropriate because: pending HD arrangements as outpatient   Planned Discharge Destination: Home     Author: Tawni Millers, MD 05/16/2022 6:01 PM  For on call review [email or web address].

## 2022-05-16 NOTE — Progress Notes (Signed)
   05/16/22 1035  Mobility  Activity Ambulated independently in hallway  Level of Assistance Independent  Assistive Device None  Distance Ambulated (ft) 250 ft  Activity Response Tolerated well  Mobility Referral Yes  $Mobility charge 1 Mobility   Mobility Specialist Progress Note  Pt was in bed and agreeable. Had c/o hip pain throughout walk. Returned to bed w/ all needs met and call bell in reach.   Lucious Groves Mobility Specialist  Please contact via SecureChat or Rehab office at [phone]

## 2022-05-17 ENCOUNTER — Telehealth (HOSPITAL_COMMUNITY): Payer: Self-pay | Admitting: Pharmacy Technician

## 2022-05-17 ENCOUNTER — Other Ambulatory Visit (HOSPITAL_COMMUNITY): Payer: Self-pay

## 2022-05-17 DIAGNOSIS — I5033 Acute on chronic diastolic (congestive) heart failure: Secondary | ICD-10-CM | POA: Diagnosis not present

## 2022-05-17 DIAGNOSIS — R040 Epistaxis: Secondary | ICD-10-CM | POA: Diagnosis not present

## 2022-05-17 DIAGNOSIS — I10 Essential (primary) hypertension: Secondary | ICD-10-CM | POA: Diagnosis not present

## 2022-05-17 DIAGNOSIS — N179 Acute kidney failure, unspecified: Secondary | ICD-10-CM | POA: Diagnosis not present

## 2022-05-17 LAB — RENAL FUNCTION PANEL
Albumin: 3 g/dL — ABNORMAL LOW (ref 3.5–5.0)
Anion gap: 14 (ref 5–15)
BUN: 65 mg/dL — ABNORMAL HIGH (ref 6–20)
CO2: 22 mmol/L (ref 22–32)
Calcium: 7.4 mg/dL — ABNORMAL LOW (ref 8.9–10.3)
Chloride: 98 mmol/L (ref 98–111)
Creatinine, Ser: 7.61 mg/dL — ABNORMAL HIGH (ref 0.61–1.24)
GFR, Estimated: 8 mL/min — ABNORMAL LOW (ref >60)
Glucose, Bld: 148 mg/dL — ABNORMAL HIGH (ref 70–99)
Phosphorus: 6.6 mg/dL — ABNORMAL HIGH (ref 2.5–4.6)
Potassium: 4 mmol/L (ref 3.5–5.1)
Sodium: 134 mmol/L — ABNORMAL LOW (ref 135–145)

## 2022-05-17 LAB — CBC
HCT: 24.1 % — ABNORMAL LOW (ref 39.0–52.0)
Hemoglobin: 7.7 g/dL — ABNORMAL LOW (ref 13.0–17.0)
MCH: 29.3 pg (ref 26.0–34.0)
MCHC: 32 g/dL (ref 30.0–36.0)
MCV: 91.6 fL (ref 80.0–100.0)
Platelets: 238 10*3/uL (ref 150–400)
RBC: 2.63 MIL/uL — ABNORMAL LOW (ref 4.22–5.81)
RDW: 12.9 % (ref 11.5–15.5)
WBC: 9 10*3/uL (ref 4.0–10.5)
nRBC: 0 % (ref 0.0–0.2)

## 2022-05-17 MED ORDER — CALCITRIOL 0.5 MCG PO CAPS
0.5000 ug | ORAL_CAPSULE | Freq: Every day | ORAL | 0 refills | Status: DC
  Filled 2022-05-17: qty 30, 30d supply, fill #0

## 2022-05-17 MED ORDER — CARVEDILOL 6.25 MG PO TABS
6.2500 mg | ORAL_TABLET | Freq: Two times a day (BID) | ORAL | 0 refills | Status: DC
  Filled 2022-05-17: qty 60, 30d supply, fill #0

## 2022-05-17 MED ORDER — CALCIUM ACETATE (PHOS BINDER) 667 MG PO CAPS
1334.0000 mg | ORAL_CAPSULE | Freq: Three times a day (TID) | ORAL | 0 refills | Status: DC
  Filled 2022-05-17: qty 180, 30d supply, fill #0

## 2022-05-17 MED ORDER — CALCIUM CARBONATE ANTACID 500 MG PO CHEW
800.0000 mg | CHEWABLE_TABLET | Freq: Two times a day (BID) | ORAL | 0 refills | Status: AC
  Filled 2022-05-17: qty 150, 19d supply, fill #0

## 2022-05-17 MED ORDER — HEPARIN SODIUM (PORCINE) 1000 UNIT/ML IJ SOLN
INTRAMUSCULAR | Status: AC
  Filled 2022-05-17: qty 4

## 2022-05-17 MED ORDER — ISOSORBIDE MONONITRATE ER 60 MG PO TB24
60.0000 mg | ORAL_TABLET | Freq: Every day | ORAL | 0 refills | Status: DC
  Filled 2022-05-17: qty 30, 30d supply, fill #0

## 2022-05-17 MED ORDER — HEPARIN SODIUM (PORCINE) 1000 UNIT/ML DIALYSIS
40.0000 [IU]/kg | INTRAMUSCULAR | Status: DC | PRN
  Administered 2022-05-17: 3200 [IU] via INTRAVENOUS_CENTRAL
  Administered 2022-05-17: 3100 [IU] via INTRAVENOUS_CENTRAL
  Filled 2022-05-17: qty 4

## 2022-05-17 MED ORDER — ROSUVASTATIN CALCIUM 5 MG PO TABS
5.0000 mg | ORAL_TABLET | Freq: Every day | ORAL | 0 refills | Status: DC
  Filled 2022-05-17: qty 30, 30d supply, fill #0

## 2022-05-17 MED ORDER — FENOFIBRATE 54 MG PO TABS
54.0000 mg | ORAL_TABLET | Freq: Every day | ORAL | 0 refills | Status: DC
  Filled 2022-05-17: qty 30, 30d supply, fill #0

## 2022-05-17 NOTE — Discharge Summary (Signed)
Physician Discharge Summary   Patient: Paul [name] MRN: [id number] DOB: 03-Dec-1965  Admit date:     05/11/2022  Discharge date: 05/17/22  Discharge Physician: Jimmy Picket Darnita Woodrum   PCP: Ladell Pier, MD   Recommendations at discharge:    Patient will continue outpatient hemodialysis T, TH and Sat, will go to HD unit by bus, in the meanwhile arrangements for further transportation are arranged.  New prescriptions given for his medications.   Discharge Diagnoses: Principal Problem:   Acute on chronic diastolic CHF (congestive heart failure) (HCC) Active Problems:   Acute kidney injury superimposed on chronic kidney disease (HCC)   Epistaxis   Essential hypertension   Schizophrenia ([place])  Resolved Problems:   * No resolved hospital problems. [facility] Course: [name] was admitted to the hospital with acute on chronic systolic heart failure with progressive renal disease, now on Hemodialysis.   57 yo male with the past medical history of heart failure, hypertension, CKD, chronic anemia and schizophrenia who presented with epistaxis. Apparently patient stopped taking all his medications for the last 5 days. He developed spontaneous profuse epistaxis that prompted him to come to the hospital. He reported several months of dyspnea and cough. On his initial physical examination his blood pressure was 200/120s, HR 120, RR 22, and 02 saturation 99%, lungs with no wheezing, heart with S1 and S2 present and rhythmic, abdomen with no distention, no lower extremity edema.   ENT placed packing on right nostril with good toleration.  Required one unit PRBC transfusion due to acute blood loss anemia.  Patient placed on IV furosemide for diuresis.  He had worsening renal function and required renal replacement therapy.   01/11 Patient now on HD, pending outpatient transportation arrangements in order to go home.   Assessment and Plan: * Acute on chronic diastolic CHF  (congestive heart failure) (HCC) Echocardiogram with preserved LV systolic function 45 to 50 (mild decreased). Moderate LVH. No significant valvular disease.   His volume has improved with renal replacement therapy.  Plan to continue with carvedilol and isosorbide.    Acute kidney injury superimposed on chronic kidney disease ([place]) CKD stage IV that has progressed to stage V.  Patient with progressive worsening renal function.  Right IJ TDC placed on 01/08 Patient had HD 01/08, 01/09, 01/10 and 01/12.   Outpatient HD, schedule will be Tues, Thurs, Sat.   Anemia of chronic renal disease, worsened by epistaxis,  Sp 1 unit PRBC transfusion.  Continue EPO.  Metabolic bone disease, continue with calcitriol.   Pending outpatient HD transportation arrangements.   Epistaxis No further bleeding Nasal packing from right nostril has been removed with no complications.    Essential hypertension Continue blood pressure control with carvedilol, hydralazine and isosorbide.   Schizophrenia ([place]) Follow up as outpatient.          Consultants: nephrology and IR  Procedures performed: right IJ tunneled HD catheter   Disposition: Home Diet recommendation:  Discharge Diet Orders (From admission, onward)     Start     Ordered   05/17/22 0000  Diet - low sodium heart healthy        05/17/22 1517           Cardiac diet DISCHARGE MEDICATION: Allergies as of 05/17/2022       Reactions   Penicillins Other (See Comments)   childhood        Medication List     STOP taking these medications  aspirin 81 MG chewable tablet   hydrALAZINE 50 MG tablet Commonly known as: APRESOLINE   torsemide 20 MG tablet Commonly known as: DEMADEX       TAKE these medications    calcitRIOL 0.5 MCG capsule Commonly known as: ROCALTROL Take 1 capsule (0.5 mcg total) by mouth daily. Start taking on: May 18, 2022   calcium acetate 667 MG capsule Commonly known as:  PHOSLO Take 2 capsules (1,334 mg total) by mouth 3 (three) times daily with meals.   calcium carbonate 500 MG chewable tablet Commonly known as: TUMS - dosed in mg elemental calcium Chew 4 tablets (800 mg of elemental calcium total) by mouth 2 (two) times daily.   carvedilol 6.25 MG tablet Commonly known as: COREG Take 1 tablet (6.25 mg total) by mouth 2 (two) times daily with a meal.   fenofibrate 54 MG tablet Take 1 tablet (54 mg total) by mouth daily.   isosorbide mononitrate 60 MG 24 hr tablet Commonly known as: IMDUR Take 1 tablet (60 mg total) by mouth daily.   rosuvastatin 5 MG tablet Commonly known as: Crestor Take 1 tablet (5 mg total) by mouth daily.        Follow-up Information     Emilie Rutter, [facility]. Go on 05/21/2022.   Why: Schedule is Tuesday/Thursday/Saturday with 11:55 am chair time.  On Tuesday, please arrive at 11:00 am to complete paperwork prior to treatment. Contact information: [address] [place] [address] [phone]                Discharge Exam: Filed Weights   05/17/22 0616 05/17/22 0726 05/17/22 1158  Weight: 77.4 kg 77.4 kg 76.4 kg   BP (!) 150/87 (BP Location: Left Arm)   Pulse 84   Temp (!) 97.5 F (36.4 C) (Oral)   Resp 17   Ht 5\' 3"  (1.6 m)   Wt 76.4 kg   SpO2 99%   BMI 29.84 kg/m   Patient is feeling better, no dyspnea and no epistaxis.   Neurology awake and alert ENT with mild pallor Cardiovascular with S1 and S2 present and rhythmic Respiratory with no rales or wheezing Abdomen with no distention No lower extremity edema   Condition at discharge: stable  The results of significant diagnostics from this hospitalization (including imaging, microbiology, ancillary and laboratory) are listed below for reference.   Imaging Studies: IR Fluoro Guide CV Line Right  Result Date: 05/13/2022 CLINICAL DATA:  End-stage renal disease and need for tunneled hemodialysis catheter placement. EXAM: TUNNELED  CENTRAL VENOUS HEMODIALYSIS CATHETER PLACEMENT WITH ULTRASOUND AND FLUOROSCOPIC GUIDANCE ANESTHESIA/SEDATION: Moderate (conscious) sedation was employed during this procedure. A total of Versed 1.0 mg and Fentanyl 50 mcg was administered intravenously by radiology nursing. Moderate Sedation Time: 14 minutes. The patient's level of consciousness and vital signs were monitored continuously by radiology nursing throughout the procedure under my direct supervision. MEDICATIONS: 1 g IV vancomycin. FLUOROSCOPY: 18 seconds.  2.0 mGy. PROCEDURE: The procedure, risks, benefits, and alternatives were explained to the patient. Questions regarding the procedure were encouraged and answered. The patient understands and consents to the procedure. A timeout was performed prior to initiating the procedure. The right neck and chest were prepped with chlorhexidine in a sterile fashion, and a sterile drape was applied covering the operative field. Maximum barrier sterile technique with sterile gowns and gloves were used for the procedure. Local anesthesia was provided with 1% lidocaine. Ultrasound was used to confirm patency of the right internal jugular vein.  A permanent ultrasound image was recorded. After creating a small venotomy incision, a 21 gauge needle was advanced into the right internal jugular vein under direct, real-time ultrasound guidance. Ultrasound image documentation was performed. After securing guidewire access, an 8 Fr dilator was placed. A J-wire was kinked to measure appropriate catheter length. A Palindrome tunneled hemodialysis catheter measuring 19 cm from tip to cuff was chosen for placement. This was tunneled in a retrograde fashion from the chest wall to the venotomy incision. At the venotomy, serial dilatation was performed and a 15 Fr peel-away sheath was placed over a guidewire. The catheter was then placed through the sheath and the sheath removed. Final catheter positioning was confirmed and  documented with a fluoroscopic spot image. The catheter was aspirated, flushed with saline, and injected with appropriate volume heparin dwells. The venotomy incision was closed with subcuticular 4-0 Vicryl. Dermabond was applied to the incision. The catheter exit site was secured with 0-Prolene retention sutures. COMPLICATIONS: None.  No pneumothorax. FINDINGS: After catheter placement, the tip lies in the right atrium. The catheter aspirates normally and is ready for immediate use. IMPRESSION: Placement of tunneled hemodialysis catheter via the right internal jugular vein. The catheter tip lies in the right atrium. The catheter is ready for immediate use. Electronically Signed   By: Aletta Edouard M.D.   On: 05/13/2022 09:56   IR US Guide Vasc Access Right  Result Date: 05/13/2022 CLINICAL DATA:  End-stage renal disease and need for tunneled hemodialysis catheter placement. EXAM: TUNNELED CENTRAL VENOUS HEMODIALYSIS CATHETER PLACEMENT WITH ULTRASOUND AND FLUOROSCOPIC GUIDANCE ANESTHESIA/SEDATION: Moderate (conscious) sedation was employed during this procedure. A total of Versed 1.0 mg and Fentanyl 50 mcg was administered intravenously by radiology nursing. Moderate Sedation Time: 14 minutes. The patient's level of consciousness and vital signs were monitored continuously by radiology nursing throughout the procedure under my direct supervision. MEDICATIONS: 1 g IV vancomycin. FLUOROSCOPY: 18 seconds.  2.0 mGy. PROCEDURE: The procedure, risks, benefits, and alternatives were explained to the patient. Questions regarding the procedure were encouraged and answered. The patient understands and consents to the procedure. A timeout was performed prior to initiating the procedure. The right neck and chest were prepped with chlorhexidine in a sterile fashion, and a sterile drape was applied covering the operative field. Maximum barrier sterile technique with sterile gowns and gloves were used for the procedure. Local  anesthesia was provided with 1% lidocaine. Ultrasound was used to confirm patency of the right internal jugular vein. A permanent ultrasound image was recorded. After creating a small venotomy incision, a 21 gauge needle was advanced into the right internal jugular vein under direct, real-time ultrasound guidance. Ultrasound image documentation was performed. After securing guidewire access, an 8 Fr dilator was placed. A J-wire was kinked to measure appropriate catheter length. A Palindrome tunneled hemodialysis catheter measuring 19 cm from tip to cuff was chosen for placement. This was tunneled in a retrograde fashion from the chest wall to the venotomy incision. At the venotomy, serial dilatation was performed and a 15 Fr peel-away sheath was placed over a guidewire. The catheter was then placed through the sheath and the sheath removed. Final catheter positioning was confirmed and documented with a fluoroscopic spot image. The catheter was aspirated, flushed with saline, and injected with appropriate volume heparin dwells. The venotomy incision was closed with subcuticular 4-0 Vicryl. Dermabond was applied to the incision. The catheter exit site was secured with 0-Prolene retention sutures. COMPLICATIONS: None.  No pneumothorax. FINDINGS: After catheter placement,  the tip lies in the right atrium. The catheter aspirates normally and is ready for immediate use. IMPRESSION: Placement of tunneled hemodialysis catheter via the right internal jugular vein. The catheter tip lies in the right atrium. The catheter is ready for immediate use. Electronically Signed   By: Aletta Edouard M.D.   On: 05/13/2022 09:56   ECHOCARDIOGRAM COMPLETE  Result Date: 05/12/2022    ECHOCARDIOGRAM REPORT   Patient Name:   Marius Betts Weinmann Date of Exam: 05/12/2022 Medical Rec #:  [id number]         Height:       63.0 in Accession #:    [id number]        Weight:       183.8 lb Date of Birth:  [birth year]         BSA:          1.865 m  Patient Age:    12 years          BP:           165/122 mmHg Patient Gender: M                 HR:           84 bpm. Exam Location:  Inpatient Procedure: 2D Echo, Cardiac Doppler, Color Doppler and Intracardiac            Opacification Agent Indications:    CHF-acute systolic  History:        Patient has prior history of Echocardiogram examinations, most                 recent 05/13/2021. CHF; Risk Factors:Hypertension and                 Dyslipidemia. CKD.  Sonographer:    Clayton Lefort RDCS (AE) Referring Phys: Shela Leff  Sonographer Comments: Technically difficult study due to poor echo windows. Image acquisition challenging due to respiratory motion. IMPRESSIONS  1. Left ventricular ejection fraction, by estimation, is 45 to 50%. The left ventricle has mildly decreased function. The left ventricle has no regional wall motion abnormalities. There is moderate left ventricular hypertrophy. Left ventricular diastolic parameters are indeterminate.  2. Right ventricular systolic function was not well visualized. The right ventricular size is normal. Tricuspid regurgitation signal is inadequate for assessing PA pressure.  3. The mitral valve is grossly normal. No evidence of mitral valve regurgitation. No evidence of mitral stenosis.  4. The aortic valve was not well visualized. Aortic valve regurgitation is not visualized. No aortic stenosis is present.  5. The inferior vena cava is normal in size with <50% respiratory variability, suggesting right atrial pressure of 8 mmHg. Comparison(s): Prior images reviewed side by side. Changes from prior study are noted. LVEF improved from 30% to 45-50% now. FINDINGS  Left Ventricle: Left ventricular ejection fraction, by estimation, is 45 to 50%. The left ventricle has mildly decreased function. The left ventricle has no regional wall motion abnormalities. Definity contrast agent was given IV to delineate the left ventricular endocardial borders. The left ventricular  internal cavity size was normal in size. There is moderate left ventricular hypertrophy. Left ventricular diastolic parameters are indeterminate. Right Ventricle: The right ventricular size is normal. Right vetricular wall thickness was not well visualized. Right ventricular systolic function was not well visualized. Tricuspid regurgitation signal is inadequate for assessing PA pressure. Left Atrium: Left atrial size was normal in size. Right Atrium: Right atrial size was normal in size. Pericardium: There  is no evidence of pericardial effusion. Mitral Valve: The mitral valve is grossly normal. No evidence of mitral valve regurgitation. No evidence of mitral valve stenosis. Tricuspid Valve: The tricuspid valve is not well visualized. Tricuspid valve regurgitation is not demonstrated. No evidence of tricuspid stenosis. Aortic Valve: The aortic valve was not well visualized. Aortic valve regurgitation is not visualized. No aortic stenosis is present. Aortic valve mean gradient measures 3.0 mmHg. Aortic valve peak gradient measures 5.2 mmHg. Aortic valve area, by VTI measures 1.63 cm. Pulmonic Valve: The pulmonic valve was not well visualized. Pulmonic valve regurgitation is not visualized. No evidence of pulmonic stenosis. Aorta: The aortic root and ascending aorta are structurally normal, with no evidence of dilitation. Venous: The inferior vena cava is normal in size with less than 50% respiratory variability, suggesting right atrial pressure of 8 mmHg. IAS/Shunts: No atrial level shunt detected by color flow Doppler.  LEFT VENTRICLE PLAX 2D LVIDd:         5.30 cm      Diastology LVIDs:         4.40 cm      LV e' medial:    4.35 cm/s LV PW:         1.40 cm      LV E/e' medial:  14.1 LV IVS:        1.40 cm      LV e' lateral:   4.90 cm/s LVOT diam:     2.00 cm      LV E/e' lateral: 12.5 LV SV:         33 LV SV Index:   18 LVOT Area:     3.14 cm  LV Volumes (MOD) LV vol d, MOD A2C: 103.0 ml LV vol d, MOD A4C: 122.0  ml LV vol s, MOD A2C: 68.7 ml LV vol s, MOD A4C: 72.6 ml LV SV MOD A2C:     34.3 ml LV SV MOD A4C:     122.0 ml LV SV MOD BP:      42.4 ml RIGHT VENTRICLE         IVC TAPSE (M-mode): 2.5 cm  IVC diam: 1.90 cm LEFT ATRIUM           Index        RIGHT ATRIUM           Index LA diam:      3.50 cm 1.88 cm/m   RA Area:     16.40 cm LA Vol (A2C): 31.2 ml 16.73 ml/m  RA Volume:   41.40 ml  22.19 ml/m LA Vol (A4C): 62.9 ml 33.72 ml/m  AORTIC VALVE AV Area (Vmax):    1.59 cm AV Area (Vmean):   1.50 cm AV Area (VTI):     1.63 cm AV Vmax:           114.00 cm/s AV Vmean:          75.500 cm/s AV VTI:            0.201 m AV Peak Grad:      5.2 mmHg AV Mean Grad:      3.0 mmHg LVOT Vmax:         57.80 cm/s LVOT Vmean:        36.100 cm/s LVOT VTI:          0.104 m LVOT/AV VTI ratio: 0.52  AORTA Ao Root diam: 3.40 cm Ao Asc diam:  3.30 cm MITRAL VALVE MV Area (PHT): 3.27 cm  SHUNTS MV Decel Time: 232 msec    Systemic VTI:  0.10 m MV E velocity: 61.40 cm/s  Systemic Diam: 2.00 cm MV A velocity: 58.10 cm/s MV E/A ratio:  1.06 Vishnu Priya Mallipeddi Electronically signed by Lorelee Cover Mallipeddi Signature Date/Time: 05/12/2022/12:46:53 PM    Final    US RENAL  Result Date: 05/12/2022 CLINICAL DATA:  Acute kidney injury. EXAM: RENAL / URINARY TRACT ULTRASOUND COMPLETE COMPARISON:  06/02/2021 FINDINGS: Right Kidney: Renal measurements: 8.9 x 4.9 x 4.1 cm = volume: 93 mL. Increased cortical echogenicity without hydronephrosis. Left Kidney: Renal measurements: 8.7 x 4.2 x 4.0 cm = volume: 76 mL. Increased echogenicity without hydronephrosis. 12 mm lower pole cyst again identified. Bladder: Could not be visualized secondary to patient positioning. Other: None. IMPRESSION: 1. No hydronephrosis. 2. Increased cortical echogenicity in both kidneys consistent with chronic medical renal disease. 3. Bladder could not be visualized secondary to patient positioning. Electronically Signed   By: Misty Stanley M.D.   On: 05/12/2022 06:50    DG Chest 2 View  Result Date: 05/11/2022 CLINICAL DATA:  Shortness of breath. EXAM: CHEST - 2 VIEW COMPARISON:  Chest radiograph dated June 03, 2021. FINDINGS: The heart is mildly enlarged. Mild pulmonary vascular congestion. Lungs otherwise clear without evidence of focal consolidation or pleural effusion. Thoracic spondylosis. IMPRESSION: Mild cardiomegaly and pulmonary vascular congestion. Electronically Signed   By: Keane Police D.O.   On: 05/11/2022 23:59    Microbiology: Results for orders placed or performed during the hospital encounter of 06/01/21  Resp Panel by RT-PCR (Flu A&B, Covid) Nasopharyngeal Swab     Status: None   Collection Time: 06/02/21  2:22 AM   Specimen: Nasopharyngeal Swab; Nasopharyngeal(NP) swabs in vial transport medium  Result Value Ref Range Status   SARS Coronavirus 2 by RT PCR NEGATIVE NEGATIVE Final    Comment: (NOTE) SARS-CoV-2 target nucleic acids are NOT DETECTED.  The SARS-CoV-2 RNA is generally detectable in upper respiratory specimens during the acute phase of infection. The lowest concentration of SARS-CoV-2 viral copies this assay can detect is 138 copies/mL. A negative result does not preclude SARS-Cov-2 infection and should not be used as the sole basis for treatment or other patient management decisions. A negative result may occur with  improper specimen collection/handling, submission of specimen other than nasopharyngeal swab, presence of viral mutation(s) within the areas targeted by this assay, and inadequate number of viral copies(<138 copies/mL). A negative result must be combined with clinical observations, patient history, and epidemiological information. The expected result is Negative.  Fact Sheet for Patients:  EntrepreneurPulse.com.au  Fact Sheet for Healthcare Providers:  IncredibleEmployment.be  This test is no t yet approved or cleared by the [place] FDA and  has been  authorized for detection and/or diagnosis of SARS-CoV-2 by FDA under an Emergency Use Authorization (EUA). This EUA will remain  in effect (meaning this test can be used) for the duration of the COVID-19 declaration under Section 564(b)(1) of the Act, 21 U.S.C.section 360bbb-3(b)(1), unless the authorization is terminated  or revoked sooner.       Influenza A by PCR NEGATIVE NEGATIVE Final   Influenza B by PCR NEGATIVE NEGATIVE Final    Comment: (NOTE) The Xpert Xpress SARS-CoV-2/FLU/RSV plus assay is intended as an aid in the diagnosis of influenza from Nasopharyngeal swab specimens and should not be used as a sole basis for treatment. Nasal washings and aspirates are unacceptable for Xpert Xpress SARS-CoV-2/FLU/RSV testing.  Fact Sheet for Patients: EntrepreneurPulse.com.au  Fact Sheet for Healthcare Providers: IncredibleEmployment.be  This test is not yet approved or cleared by the [place] FDA and has been authorized for detection and/or diagnosis of SARS-CoV-2 by FDA under an Emergency Use Authorization (EUA). This EUA will remain in effect (meaning this test can be used) for the duration of the COVID-19 declaration under Section 564(b)(1) of the Act, 21 U.S.C. section 360bbb-3(b)(1), unless the authorization is terminated or revoked.  Performed at [facility], [place] [address] [place], [place] [address]   MRSA Next Gen by PCR, Nasal     Status: None   Collection Time: 06/02/21  3:51 PM   Specimen: Nasal Mucosa; Nasal Swab  Result Value Ref Range Status   MRSA by PCR Next Gen NOT DETECTED NOT DETECTED Final    Comment: (NOTE) The GeneXpert MRSA Assay (FDA approved for NASAL specimens only), is one component of a comprehensive MRSA colonization surveillance program. It is not intended to diagnose MRSA infection nor to guide or monitor treatment for MRSA infections. Test performance is not FDA approved in patients less than  18 years old. Performed at [facility], [place] [address], [place], [place] [address]     Labs: CBC: Recent Labs  Lab 05/12/22 0636 05/12/22 1536 05/13/22 0035 05/14/22 0036 05/15/22 0028 05/17/22 0811  WBC 6.8  --  11.0* 10.6* 10.6* 9.0  NEUTROABS  --   --  9.2* 8.4* 7.6  --   HGB 5.9* 7.3* 7.3* 7.6* 8.1* 7.7*  HCT 19.1* 21.9* 21.4* 23.0* 23.6* 24.1*  MCV 94.1  --  87.7 88.8 88.1 91.6  PLT 200  --  234 241 236 757   Basic Metabolic Panel: Recent Labs  Lab 05/12/22 0636 05/13/22 0035 05/14/22 0036 05/15/22 0028 05/16/22 0158 05/17/22 0811  NA 140 138 136 135 135 134*  K 5.1 3.8 3.5 3.8 3.8 4.0  CL 109 107 99 95* 96* 98  CO2 17* 18* 23 26 26 22   GLUCOSE 93 107* 104* 103* 97 148*  BUN 87* 89* 58* 57* 51* 65*  CREATININE 9.09* 9.46* 6.95* 6.12* 5.99* 7.61*  CALCIUM 5.6*  5.8* 6.4* 7.4* 7.3* 7.4* 7.4*  MG 2.0  --   --   --   --   --   PHOS 7.9*  --   --   --  5.2* 6.6*   Liver Function Tests: Recent Labs  Lab 05/11/22 2342 05/12/22 0636 05/16/22 0158 05/17/22 0811  AST 14*  --   --   --   ALT 15  --   --   --   ALKPHOS 86  --   --   --   BILITOT 0.9  --   --   --   PROT 7.8  --   --   --   ALBUMIN 3.4* 3.2* 2.9* 3.0*   CBG: No results for input(s): "GLUCAP" in the last 168 hours.  Discharge time spent: greater than 30 minutes.  Signed: Tawni Millers, MD [facility] 05/17/2022

## 2022-05-17 NOTE — TOC Transition Note (Signed)
Transition of Care [facility]) - CM/SW Discharge Note   Patient Details  Name: Paul [name] MRN: [id number] Date of Birth: [birth year]  [facility] CM/SW Contact:  Zenon Mayo, RN Phone Number: 05/17/2022, 3:52 PM   Clinical Narrative:    Patient is for dc today, gave him a bus pass, NCM offered choice for outpatient palliative services. He is ok with [facility].  NCM made referral to Cheri with [facility] for outpatient pall services. CSW set up OP HD transportation.         Patient Goals and CMS Choice      Discharge Placement                         Discharge Plan and Services Additional resources added to the After Visit Summary for                                       Social Determinants of Health (SDOH) Interventions SDOH Screenings   Food Insecurity: No Food Insecurity (05/12/2022)  Housing: Low Risk  (05/12/2022)  Transportation Needs: No Transportation Needs (05/12/2022)  Utilities: Not At Risk (05/12/2022)  Depression (PHQ2-9): Low Risk  (06/07/2021)  Tobacco Use: Medium Risk (05/13/2022)     Readmission Risk Interventions    06/05/2021   10:09 AM  Readmission Risk Prevention Plan  Post Dischage Appt Complete  Medication Screening Complete  Transportation Screening Complete

## 2022-05-17 NOTE — Plan of Care (Signed)
  Problem: Education: Goal: Ability to demonstrate management of disease process will improve Outcome: Progressing Goal: Ability to verbalize understanding of medication therapies will improve Outcome: Progressing Goal: Individualized Educational Video(s) Outcome: Progressing   Problem: Activity: Goal: Capacity to carry out activities will improve Outcome: Progressing   

## 2022-05-17 NOTE — Progress Notes (Addendum)
Contacted Paul [name] to advise clinic that pt's d/c date is pending due to transportation pending. Clinic advised pt receiving HD today and can start on Tuesday if d/c today or over the weekend per nephrologist. Will make renal PA aware of clinics need for orders should pt d/c this weekend. Arrangements added to AVS.   Melven Sartorius Renal Navigator [phone]  Addendum at 3:50 pm: D/C order noted. Contacted Paul [name] to advise clinic of pt's d/c today and that pt should start on Tuesday. Renal NP to send orders to clinic.

## 2022-05-17 NOTE — Progress Notes (Signed)
Received patient in bed to unit.  Alert and oriented.  Informed consent signed and in chart.   Treatment initiated: 08:20 Treatment completed: 11:58  Patient tolerated well.  Transported back to the room  Alert, without acute distress.  Hand-off given to patient's nurse.   Access used: right [facility] Access issues: none  Total UF removed: 1L Medication(s) given: iv iron    05/17/22 1158  Vitals  Temp (!) 97.5 F (36.4 C)  Temp Source Oral  BP (!) 147/86  MAP (mmHg) 105  BP Location Left Arm  BP Method Automatic  Patient Position (if appropriate) Lying  Pulse Rate 85  Pulse Rate Source Monitor  ECG Heart Rate 86  Resp 17  Oxygen Therapy  SpO2 100 %  O2 Device Room Air  Patient Activity (if Appropriate) In bed  During Treatment Monitoring  HD Safety Checks Performed Yes  Intra-Hemodialysis Comments Tolerated well;Tx completed  Hemodialysis Catheter Right Internal jugular Double lumen Permanent (Tunneled)  Placement Date/Time: 05/13/22 0908   Serial / Lot #: [id number]  Expiration Date: 11/05/26  Time Out: Correct patient;Correct site;Correct procedure  Maximum sterile barrier precautions: Hand hygiene;Cap;Mask;Sterile gown;Sterile gloves;Large sterile ...  Site Condition No complications  Blue Lumen Status Flushed;Dead end cap in place;Heparin locked  Red Lumen Status Flushed;Dead end cap in place;Heparin locked  Purple Lumen Status N/A  Catheter fill solution Heparin 1000 units/ml  Catheter fill volume (Arterial) 1.6 cc  Catheter fill volume (Venous) 1.6  Dressing Type Transparent  Dressing Status Antimicrobial disc in place  Drainage Description None  Post treatment catheter status Capped and Clamped      Kunal Levario S [facility]

## 2022-05-17 NOTE — Telephone Encounter (Signed)
Patient Advocate Encounter  Prior Authorization for Fenofibrate 54 mg has been approved.    PA# [id number] Effective dates: 05/17/2022 through 05/17/2023      Lyndel Safe, [place] Patient Advocate Specialist [place] Patient Advocate Team Direct Number: [phone]  Fax: [phone]

## 2022-05-17 NOTE — Procedures (Signed)
Patient seen on Hemodialysis. BP (!) 150/91 (BP Location: Left Arm)   Pulse 85   Temp 97.7 F (36.5 C) (Oral)   Resp 16   Ht 5\' 3"  (1.6 m)   Wt 77.4 kg   SpO2 98%   BMI 30.23 kg/m   QB 250, UF goal 1L Tolerating treatment without complaints at this time. Awaiting confirmation of transportation set-up to HD. Scheduled TTS at [facility] and after getting dialysis here today, if discharged home can start dialysis at OP kidney center on Tuesday 1/16.  Elmarie Shiley MD [facility]. Office # [phone] Pager # [phone] 9:14 AM

## 2022-05-17 NOTE — Progress Notes (Signed)
   Medical records reviewed including progress notes and labs. Discharge is pending confirmation of transportation to OP HD via access [place]. Goals of care remain clear, patient is agreeable to outpatient palliative care for continued [place] discussions based on his experience and thoughts on continuing OP HD vs transitioning to hospice.  Thank you for your referral and allowing PMT to assist in Mr. Paul [name]'s care.   Dorthy Cooler, [facility] Palliative Medicine Team  Team Phone # [phone]   NO CHARGE

## 2022-05-17 NOTE — TOC Progression Note (Signed)
Transition of Care [facility]) - Progression Note    Patient Details  Name: Paul [name] MRN: [id number] Date of Birth: Jul 26, 1965  Transition of Care [facility]) CM/SW [place], LCSW Phone Number: 05/17/2022, 3:27 PM  Clinical Narrative:    CSW was notified that additional paperwork was required for the access [place] application to be complete. CSW completed Part A of application with patient and submitted it with a completed copy of Part B of [facility] [place] application. Application was emailed to [email or web address]. Courtney informed CSW that once application is confirmed and complete she can get pt pre-certified. CSW communicated this updates with pt. Pt mentioned that there was a possiblity he may need transportation home once dc, but was not sure. TOC will continue to follow.         Expected Discharge Plan and Services         Expected Discharge Date: 05/17/22                                     Social Determinants of Health (SDOH) Interventions SDOH Screenings   Food Insecurity: No Food Insecurity (05/12/2022)  Housing: Low Risk  (05/12/2022)  Transportation Needs: No Transportation Needs (05/12/2022)  Utilities: Not At Risk (05/12/2022)  Depression (PHQ2-9): Low Risk  (06/07/2021)  Tobacco Use: Medium Risk (05/13/2022)    Readmission Risk Interventions    06/05/2021   10:09 AM  Readmission Risk Prevention Plan  Post Dischage Appt Complete  Medication Screening Complete  Transportation Screening Complete   Beckey Rutter, MSW, LCSWA, LCASA Transitions of Care  Clinical Social Worker I

## 2022-05-18 NOTE — TOC Transition Note (Signed)
Transition of care contact from inpatient facility  Date of discharge: 05/17/22 Date of contact: 05/18/22 Method: Phone Spoke to: Patient  Patient contacted to discuss transition of care from recent inpatient hospitalization. Patient was admitted to [facility] from 05/11/22-05/17/22 with discharge diagnosis of new ESRD and acute on chronic CHF.  Medication changes were reviewed.  Patient will follow up with his outpatient HD unit on: Tuesday 05/21/22 at [facility].  Tobie Poet, NP

## 2022-05-20 ENCOUNTER — Telehealth: Payer: Self-pay

## 2022-05-20 NOTE — Telephone Encounter (Signed)
Transition Care Management Follow-up Telephone Call Date of discharge and from where: 05/17/2022, [facility] How have you been since you were released from the hospital? He stated he is doing all right Any questions or concerns? No  Items Reviewed: Did the pt receive and understand the discharge instructions provided? Yes - he has them but said he is not able to read them clearly due to poor eyesight.  I instructed him to take them to HD tomorrow and ask one of the staff members there to read them to him. Medications obtained and verified? Yes - he said he has all medications and did not have any questions about the med regime  Other? No  Any new allergies since your discharge? No  Dietary orders reviewed? yes Do you have support at home?  He said he is  living in a sober house.   Home Care and Equipment/Supplies: Were home health services ordered? no If so, what is the name of the agency? N/a  Has the agency set up a time to come to the patient's home? not applicable Were any new equipment or medical supplies ordered?  No What is the name of the medical supply agency? N/a Were you able to get the supplies/equipment? not applicable Do you have any questions related to the use of the equipment or supplies? No  Functional Questionnaire: (I = Independent and D = Dependent) ADLs: independent  A referral was made to [facility] when he was discharged from the hospital.  He said he received a call from a hospice representative but that person did not schedule a visit with him yet.  He will attend HD: T/T/S,   Follow up appointments reviewed:  PCP Hospital f/u appt confirmed? Yes  Scheduled to see Dr  Wynetta Emery- 06/21/2022.  [facility] f/u appt confirmed?  No other appointments scheduled at this time    Are transportation arrangements needed? No - I explained to him that Medicaid will provide transportation to medical appointments and dialysis.   He just needs to call DSS to register.  He said he can always take the bus.  [place] application submitted by the hospital SW.  If their condition worsens, is the pt aware to call PCP or go to the Emergency Dept.? Yes Was the patient provided with contact information for the PCP's office or ED? Yes Was to pt encouraged to call back with questions or concerns? Yes

## 2022-06-10 ENCOUNTER — Other Ambulatory Visit: Payer: Self-pay | Admitting: *Deleted

## 2022-06-10 DIAGNOSIS — N183 Chronic kidney disease, stage 3 unspecified: Secondary | ICD-10-CM

## 2022-06-13 ENCOUNTER — Other Ambulatory Visit (HOSPITAL_COMMUNITY): Payer: Self-pay

## 2022-06-15 ENCOUNTER — Other Ambulatory Visit (HOSPITAL_COMMUNITY): Payer: Self-pay

## 2022-06-15 MED ORDER — FENOFIBRATE 54 MG PO TABS
54.0000 mg | ORAL_TABLET | Freq: Every day | ORAL | 12 refills | Status: DC
  Filled 2022-06-15: qty 30, 30d supply, fill #0

## 2022-06-15 MED ORDER — ROSUVASTATIN CALCIUM 5 MG PO TABS
5.0000 mg | ORAL_TABLET | Freq: Every day | ORAL | 12 refills | Status: DC
  Filled 2022-06-15: qty 30, 30d supply, fill #0
  Filled 2022-07-12: qty 30, 30d supply, fill #1
  Filled 2022-08-12: qty 30, 30d supply, fill #2
  Filled 2022-09-12 – 2022-09-20 (×2): qty 30, 30d supply, fill #3
  Filled 2022-10-17: qty 30, 30d supply, fill #4
  Filled 2022-11-21: qty 30, 30d supply, fill #5

## 2022-06-15 MED ORDER — ISOSORBIDE MONONITRATE ER 60 MG PO TB24
60.0000 mg | ORAL_TABLET | Freq: Every day | ORAL | 12 refills | Status: DC
  Filled 2022-06-15: qty 30, 30d supply, fill #0
  Filled 2022-07-12: qty 30, 30d supply, fill #1
  Filled 2022-08-12: qty 30, 30d supply, fill #2
  Filled 2022-09-12: qty 30, 30d supply, fill #3

## 2022-06-15 MED ORDER — CARVEDILOL 6.25 MG PO TABS
6.2500 mg | ORAL_TABLET | Freq: Two times a day (BID) | ORAL | 12 refills | Status: DC
  Filled 2022-06-15: qty 60, 30d supply, fill #0
  Filled 2022-07-12: qty 60, 30d supply, fill #1
  Filled 2022-08-12: qty 60, 30d supply, fill #2
  Filled 2022-09-12 – 2023-05-22 (×2): qty 60, 30d supply, fill #3

## 2022-06-15 MED ORDER — CALCIUM ACETATE (PHOS BINDER) 667 MG PO CAPS
1334.0000 mg | ORAL_CAPSULE | Freq: Three times a day (TID) | ORAL | 12 refills | Status: DC
  Filled 2022-06-15: qty 180, 30d supply, fill #0
  Filled 2022-07-12: qty 180, 30d supply, fill #1

## 2022-06-17 ENCOUNTER — Other Ambulatory Visit: Payer: Self-pay

## 2022-06-17 ENCOUNTER — Other Ambulatory Visit (HOSPITAL_COMMUNITY): Payer: Self-pay

## 2022-06-21 ENCOUNTER — Encounter: Payer: Self-pay | Admitting: Internal Medicine

## 2022-06-21 ENCOUNTER — Ambulatory Visit: Payer: Medicaid Other | Attending: Internal Medicine | Admitting: Internal Medicine

## 2022-06-21 ENCOUNTER — Other Ambulatory Visit: Payer: Self-pay | Admitting: Physician Assistant

## 2022-06-21 VITALS — BP 101/62 | HR 85 | Temp 98.0°F | Ht 63.0 in | Wt 175.0 lb

## 2022-06-21 DIAGNOSIS — R040 Epistaxis: Secondary | ICD-10-CM | POA: Diagnosis not present

## 2022-06-21 DIAGNOSIS — Z2821 Immunization not carried out because of patient refusal: Secondary | ICD-10-CM

## 2022-06-21 DIAGNOSIS — I132 Hypertensive heart and chronic kidney disease with heart failure and with stage 5 chronic kidney disease, or end stage renal disease: Secondary | ICD-10-CM | POA: Insufficient documentation

## 2022-06-21 DIAGNOSIS — N186 End stage renal disease: Secondary | ICD-10-CM | POA: Diagnosis not present

## 2022-06-21 DIAGNOSIS — Z79899 Other long term (current) drug therapy: Secondary | ICD-10-CM | POA: Diagnosis not present

## 2022-06-21 DIAGNOSIS — I120 Hypertensive chronic kidney disease with stage 5 chronic kidney disease or end stage renal disease: Secondary | ICD-10-CM

## 2022-06-21 DIAGNOSIS — Z992 Dependence on renal dialysis: Secondary | ICD-10-CM

## 2022-06-21 DIAGNOSIS — Z09 Encounter for follow-up examination after completed treatment for conditions other than malignant neoplasm: Secondary | ICD-10-CM | POA: Diagnosis not present

## 2022-06-21 DIAGNOSIS — D631 Anemia in chronic kidney disease: Secondary | ICD-10-CM

## 2022-06-21 DIAGNOSIS — Z87891 Personal history of nicotine dependence: Secondary | ICD-10-CM | POA: Diagnosis not present

## 2022-06-21 DIAGNOSIS — Z5320 Procedure and treatment not carried out because of patient's decision for unspecified reasons: Secondary | ICD-10-CM

## 2022-06-21 DIAGNOSIS — I5042 Chronic combined systolic (congestive) and diastolic (congestive) heart failure: Secondary | ICD-10-CM

## 2022-06-21 DIAGNOSIS — N179 Acute kidney failure, unspecified: Secondary | ICD-10-CM | POA: Diagnosis not present

## 2022-06-21 NOTE — Progress Notes (Signed)
Patient ID: Paul [name], male    DOB: 10-05-1965  MRN: [id number]  CC: Congestive Heart Failure (Congestive Heart failure f/u. Candace Gallus spoke with pt to consult with PCP about stopping fenofibrate. Instructed to stop calcitriol & calcium carbonate./No to flu vax. )   Subjective: Paul [name] is a 57 y.o. male who presents for chronic ds management His concerns today include:  Patient with history of HTN, HL, combined systolic and diastolic CHF last EF [id number] 05/2022, CKD 4, obesity, bipolar disorder/schizophrenia (listed on problem list)   Patient presents for hospital follow-up.  I last saw him about a year ago. Hospitalized 1/6-04/2023 with epistaxis.  Found to have acute on chronic CHF.  Echo revealed EF of 45 to 50% with moderate LVH and no significant valvular disease.  He was also found to have progression of his CKD to end-stage renal disease.  Dialysis was started during hospitalization.  Has anemia of chronic renal disease that was worsened by epistaxis.  Transfuse 1 unit of packed red blood cell..  Blood pressure initially elevated.  Controlled with carvedilol, hydralazine and isosorbide.  Today: Patient has been going to dialysis every Tuesday, Thursday and Saturday.  He is hoping that he is able to recover some kidney function and be able to come off dialysis.  He brings a note from dialysis stating that he needs to discontinue the fenofibrate due to his end-stage renal disease. He denies any chest pains, shortness of breath or lower extremity edema at this time.  Current medications are carvedilol 6.25 mg twice a day, isosorbide 60 mg daily, Crestor 5 mg daily and PhosLo 667 mg 3 tablets 3 times daily with meals. In regards to the anemia, he has not had any further epistaxis.  No blood in the stools.  He is not sure whether he is getting ESA at dialysis. BMI is 31.  Patient states he plans to start walking 5 days a week.  He does well with eating habits.  Patient Active  Problem List   Diagnosis Date Noted   Acute on chronic diastolic CHF (congestive heart failure) ([place]) [id number]   Metabolic acidosis A999333   Epistaxis 05/12/2022   Acute on chronic blood loss anemia 05/12/2022   Hypertensive urgency 05/12/2022   Hypocalcemia 05/12/2022   Alcohol use disorder in remission 06/07/2021   Mixed hyperlipidemia 06/07/2021   Legally blind in right eye, as defined in [place] 06/07/2021   Acute on chronic diastolic heart failure ([place]) 06/02/2021   Chronic combined systolic and diastolic CHF (congestive heart failure) ([place]) 06/02/2021   SOB (shortness of breath) 06/02/2021   Acute kidney injury superimposed on chronic kidney disease ([place]) 06/02/2021   Elevated troponin 06/02/2021   Syncope and collapse 09/10/2011   CKD (chronic kidney disease), stage III ([place]) 09/10/2011   Sprain of right thumb 09/10/2011   Bipolar disorder ([place])    Schizophrenia ([place])    OBESITY 01/12/2009   Essential hypertension 01/12/2009   CHF 01/12/2009   UNSPEC COMBINED SYSTOLIC&DIASTOLIC HEART FAILURE [id number]   OSTEOARTHRITIS 01/12/2009     Current Outpatient Medications on File Prior to Visit  Medication Sig Dispense Refill   carvedilol (COREG) 6.25 MG tablet Take 1 tablet (6.25 mg total) by mouth 2 (two) times daily with a meal. 60 tablet 12   isosorbide mononitrate (IMDUR) 60 MG 24 hr tablet Take 1 tablet (60 mg total) by mouth daily. 30 tablet 12   rosuvastatin (CRESTOR) 5 MG tablet Take 1 tablet (5 mg total) by mouth  daily. 30 tablet 12   calcium acetate (PHOSLO) 667 MG capsule Take 2 capsules (1,334 mg total) by mouth 3 (three) times daily with meals. (Patient not taking: Reported on 06/21/2022) 180 capsule 12   No current facility-administered medications on file prior to visit.    Allergies  Allergen Reactions   Penicillins Other (See Comments)    childhood    Social History   Socioeconomic History   Marital status: Legally Separated    Spouse name: Not on  file   Number of children: Not on file   Years of education: Not on file   Highest education level: Not on file  Occupational History   Occupation: Furniture conservator/restorer  Tobacco Use   Smoking status: Former   Smokeless tobacco: Never   Tobacco comments:    Quit smoking 20-30 years ago, smoked for 1 year.  Vaping Use   Vaping Use: Not on file  Substance and Sexual Activity   Alcohol use: No   Drug use: No   Sexual activity: Yes    Birth control/protection: None  Other Topics Concern   Not on file  Social History Narrative   Not on file   Social Determinants of Health   Financial Resource Strain: Not on file  Food Insecurity: No Food Insecurity (05/12/2022)   Hunger Vital Sign    Worried About Running Out of Food in the Last Year: Never true    Ran Out of Food in the Last Year: Never true  Transportation Needs: No Transportation Needs (05/12/2022)   PRAPARE - Hydrologist (Medical): No    Lack of Transportation (Non-Medical): No  Physical Activity: Not on file  Stress: Not on file  Social Connections: Not on file  Intimate Partner Violence: Not At Risk (05/12/2022)   Humiliation, Afraid, Rape, and Kick questionnaire    Fear of Current or Ex-Partner: No    Emotionally Abused: No    Physically Abused: No    Sexually Abused: No    Family History  Problem Relation Age of Onset   Hypertension Father    Stroke Father     Past Surgical History:  Procedure Laterality Date   IR FLUORO GUIDE CV LINE RIGHT  05/13/2022   IR US GUIDE VASC ACCESS RIGHT  05/13/2022   JOINT REPLACEMENT     right hip replacement   right eye surgery     for glaucoma   TOTAL HIP ARTHROPLASTY Bilateral 1997, 2001    ROS: Review of Systems Negative except as stated above  PHYSICAL EXAM: BP 101/62 (BP Location: Left Arm, Patient Position: Sitting, Cuff Size: Normal)   Pulse 85   Temp 98 F (36.7 C) (Oral)   Ht 5' 3"$  (1.6 m)   Wt 175 lb (79.4 kg)   SpO2 98%   BMI 31.00 kg/m    Wt Readings from Last 3 Encounters:  06/21/22 175 lb (79.4 kg)  05/17/22 168 lb 6.9 oz (76.4 kg)  06/07/21 183 lb 12.8 oz (83.4 kg)    Physical Exam   General appearance - alert, well appearing, middle-age Caucasian male and in no distress Mental status - normal mood, behavior, speech, dress, motor activity, and thought processes Neck - supple, no significant adenopathy Chest - clear to auscultation, no wheezes, rales or rhonchi, symmetric air entry Heart - normal rate, regular rhythm, normal S1, S2, no murmurs, rubs, clicks or gallops Extremities -no lower extremity edema. Skin: He has a dialysis catheter in the left upper chest.  Latest Ref Rng & Units 05/17/2022    8:11 AM 05/16/2022    1:58 AM 05/15/2022   12:28 AM  CMP  Glucose 70 - 99 mg/dL 148  97  103   BUN 6 - 20 mg/dL 65  51  57   Creatinine 0.61 - 1.24 mg/dL 7.61  5.99  6.12   Sodium 135 - 145 mmol/L 134  135  135   Potassium 3.5 - 5.1 mmol/L 4.0  3.8  3.8   Chloride 98 - 111 mmol/L 98  96  95   CO2 22 - 32 mmol/L 22  26  26   $ Calcium 8.9 - 10.3 mg/dL 7.4  7.4  7.3    Lipid Panel     Component Value Date/Time   CHOL 163 06/05/2021 0057   TRIG 230 (H) 06/05/2021 0057   HDL 37 (L) 06/05/2021 0057   CHOLHDL 4.4 06/05/2021 0057   VLDL 46 (H) 06/05/2021 0057   LDLCALC 80 06/05/2021 0057    CBC    Component Value Date/Time   WBC 9.0 05/17/2022 0811   RBC 2.63 (L) 05/17/2022 0811   HGB 7.7 (L) 05/17/2022 0811   HCT 24.1 (L) 05/17/2022 0811   PLT 238 05/17/2022 0811   MCV 91.6 05/17/2022 0811   MCH 29.3 05/17/2022 0811   MCHC 32.0 05/17/2022 0811   RDW 12.9 05/17/2022 0811   LYMPHSABS 1.0 05/15/2022 0028   MONOABS 1.7 (H) 05/15/2022 0028   EOSABS 0.2 05/15/2022 0028   BASOSABS 0.0 05/15/2022 0028    ASSESSMENT AND PLAN:  1. Hospital discharge follow-up   2. ESRD on hemodialysis [facility]) On dialysis Monday Wednesday and Fridays.  3. Chronic combined systolic and diastolic CHF (congestive heart  failure) ([place]) Compensated.  GFR had improved from when I saw him 1 year ago.  Continue carvedilol and isosorbide.  4. Hypertension, renal disease, stage 5 chronic kidney disease or end stage renal disease ([place]) Controlled with carvedilol and isosorbide.  5. Anemia due to chronic kidney disease, on chronic dialysis (HCC) Last hemoglobin at the time of hospital discharge was around 7.  Patient is not sure if he is receiving ESA during dialysis  6. Influenza vaccination declined Recommended.  Patient declined  7. Colon cancer screening declined Discussed on encourage colon cancer screening.  Patient declined all screening methods.    Patient was given the opportunity to ask questions.  Patient verbalized understanding of the plan and was able to repeat key elements of the plan.   This documentation was completed using Radio producer.  Any transcriptional errors are unintentional.  No orders of the defined types were placed in this encounter.    Requested Prescriptions    No prescriptions requested or ordered in this encounter    Return in about 4 months (around 10/20/2022).  Karle Plumber, MD, FACP

## 2022-06-23 NOTE — Progress Notes (Unsigned)
Office Note     CC:  ESRD Requesting Provider:  Claudia Desanctis, MD  HPI: Paul [name] is a {Handed:22697} handed 57 y.o. (04-04-1966) male with kidney disease who presents at the request of Claudia Desanctis, MD for permanent HD access. The patient has had *** prior access procedures. Per pt, previous tunneled lines have been placed in ***. Current access is ***. Dialysis days are ***.   On exam, ***  The pt is *** on a statin for cholesterol management.  The pt is *** on a daily aspirin.   Other AC:  *** The pt is *** on medications for hypertension.   The pt is *** diabetic. Tobacco hx:  ***  Past Medical History:  Diagnosis Date   Bipolar disorder ([place])    Central retinal artery occlusion    with macular infarction of the right eye. status post posterior vitrectomy and photocoagulation with insertion of a shunt in 2006.   CHF (congestive heart failure) (HCC)    chronic mixed syst/diast. A999333 echo: Systolic function was mildly reduced. The estimated ejection fraction was in 45%, global HK, Grade I Diast dysfxn.   Heart failure    Obesity    Osteoarthritis    s/p right hip replacement in 2001   Schizophrenia ([place])    Severe uncontrolled hypertension     Past Surgical History:  Procedure Laterality Date   IR FLUORO GUIDE CV LINE RIGHT  05/13/2022   IR US GUIDE VASC ACCESS RIGHT  05/13/2022   JOINT REPLACEMENT     right hip replacement   right eye surgery     for glaucoma   TOTAL HIP ARTHROPLASTY Bilateral 1997, 2001    Social History   Socioeconomic History   Marital status: Legally Separated    Spouse name: Not on file   Number of children: Not on file   Years of education: Not on file   Highest education level: Not on file  Occupational History   Occupation: Furniture conservator/restorer  Tobacco Use   Smoking status: Former   Smokeless tobacco: Never   Tobacco comments:    Quit smoking 20-30 years ago, smoked for 1 year.  Vaping Use   Vaping Use: Not on file  Substance  and Sexual Activity   Alcohol use: No   Drug use: No   Sexual activity: Yes    Birth control/protection: None  Other Topics Concern   Not on file  Social History Narrative   Not on file   Social Determinants of Health   Financial Resource Strain: Not on file  Food Insecurity: No Food Insecurity (05/12/2022)   Hunger Vital Sign    Worried About Running Out of Food in the Last Year: Never true    Ran Out of Food in the Last Year: Never true  Transportation Needs: No Transportation Needs (05/12/2022)   PRAPARE - Hydrologist (Medical): No    Lack of Transportation (Non-Medical): No  Physical Activity: Not on file  Stress: Not on file  Social Connections: Not on file  Intimate Partner Violence: Not At Risk (05/12/2022)   Humiliation, Afraid, Rape, and Kick questionnaire    Fear of Current or Ex-Partner: No    Emotionally Abused: No    Physically Abused: No    Sexually Abused: No   *** Family History  Problem Relation Age of Onset   Hypertension Father    Stroke Father     Current Outpatient Medications  Medication Sig Dispense  Refill   calcium acetate (PHOSLO) 667 MG capsule Take 2 capsules (1,334 mg total) by mouth 3 (three) times daily with meals. (Patient not taking: Reported on 06/21/2022) 180 capsule 12   carvedilol (COREG) 6.25 MG tablet Take 1 tablet (6.25 mg total) by mouth 2 (two) times daily with a meal. 60 tablet 12   isosorbide mononitrate (IMDUR) 60 MG 24 hr tablet Take 1 tablet (60 mg total) by mouth daily. 30 tablet 12   rosuvastatin (CRESTOR) 5 MG tablet Take 1 tablet (5 mg total) by mouth daily. 30 tablet 12   No current facility-administered medications for this visit.    Allergies  Allergen Reactions   Penicillins Other (See Comments)    childhood     REVIEW OF SYSTEMS:  *** [X]$  denotes positive finding, [ ]$  denotes negative finding Cardiac  Comments:  Chest pain or chest pressure:    Shortness of breath upon exertion:     Short of breath when lying flat:    Irregular heart rhythm:        Vascular    Pain in calf, thigh, or hip brought on by ambulation:    Pain in feet at night that wakes you up from your sleep:     Blood clot in your veins:    Leg swelling:         Pulmonary    Oxygen at home:    Productive cough:     Wheezing:         Neurologic    Sudden weakness in arms or legs:     Sudden numbness in arms or legs:     Sudden onset of difficulty speaking or slurred speech:    Temporary loss of vision in one eye:     Problems with dizziness:         Gastrointestinal    Blood in stool:     Vomited blood:         Genitourinary    Burning when urinating:     Blood in urine:        Psychiatric    Major depression:         Hematologic    Bleeding problems:    Problems with blood clotting too easily:        Skin    Rashes or ulcers:        Constitutional    Fever or chills:      PHYSICAL EXAMINATION:  There were no vitals filed for this visit.  General:  WDWN in NAD; vital signs documented above Gait: Not observed HENT: WNL, normocephalic Pulmonary: normal non-labored breathing , without Rales, rhonchi,  wheezing Cardiac: {Desc; regular/irreg:14544} HR, without  Murmurs {With/Without:20273} carotid bruit*** Abdomen: soft, NT, no masses Skin: {With/Without:20273} rashes Vascular Exam/Pulses:  Right Left  Radial {Exam; arterial pulse strength 0-4:30167} {Exam; arterial pulse strength 0-4:30167}  Ulnar {Exam; arterial pulse strength 0-4:30167} {Exam; arterial pulse strength 0-4:30167}  Femoral {Exam; arterial pulse strength 0-4:30167} {Exam; arterial pulse strength 0-4:30167}  Popliteal {Exam; arterial pulse strength 0-4:30167} {Exam; arterial pulse strength 0-4:30167}  DP {Exam; arterial pulse strength 0-4:30167} {Exam; arterial pulse strength 0-4:30167}  PT {Exam; arterial pulse strength 0-4:30167} {Exam; arterial pulse strength 0-4:30167}   Extremities: {With/Without:20273}  ischemic changes, {With/Without:20273} Gangrene , {With/Without:20273} cellulitis; {With/Without:20273} open wounds;  Musculoskeletal: no muscle wasting or atrophy  Neurologic: A&O X 3;  No focal weakness or paresthesias are detected Psychiatric:  The pt has {Desc; normal/abnormal:11317::"Normal"} affect.   Non-Invasive Vascular Imaging:   ***  ASSESSMENT/PLAN:  DARRIEL MANDIA is a 57 y.o. male who presents with {KidneyDisease:19197::"end stage renal disease","chronic kidney disease stage ***"}  Based on vein mapping and examination, ***. I had an extensive discussion with this patient in regards to the nature of access surgery, including risk, benefits, and alternatives.   The patient is aware that the risks of access surgery include but are not limited to: bleeding, infection, steal syndrome, nerve damage, ischemic monomelic neuropathy, failure of access to mature, complications related to venous hypertension, and possible need for additional access procedures in the future. *** I discussed with the patient the nature of the staged access procedure, specifically the need for a second operation to transpose the first stage fistula if it matures adequately.   The patient has *** agreed to proceed with the above procedure which will be scheduled ***.  Broadus John, MD [facility] [phone]

## 2022-06-24 ENCOUNTER — Ambulatory Visit (INDEPENDENT_AMBULATORY_CARE_PROVIDER_SITE_OTHER): Payer: Medicaid Other | Admitting: Vascular Surgery

## 2022-06-24 ENCOUNTER — Encounter: Payer: Self-pay | Admitting: Vascular Surgery

## 2022-06-24 ENCOUNTER — Ambulatory Visit (HOSPITAL_COMMUNITY)
Admission: RE | Admit: 2022-06-24 | Discharge: 2022-06-24 | Disposition: A | Discharge status: Home / Self Care | Payer: Medicaid Other | Source: Ambulatory Visit | Attending: Vascular Surgery | Admitting: Vascular Surgery

## 2022-06-24 ENCOUNTER — Ambulatory Visit (INDEPENDENT_AMBULATORY_CARE_PROVIDER_SITE_OTHER)
Admission: RE | Admit: 2022-06-24 | Discharge: 2022-06-24 | Disposition: A | Discharge status: Home / Self Care | Payer: Medicaid Other | Source: Ambulatory Visit | Attending: Vascular Surgery | Admitting: Vascular Surgery

## 2022-06-24 VITALS — BP 131/84 | HR 80 | Temp 98.0°F | Resp 16 | Ht 63.0 in | Wt 180.0 lb

## 2022-06-24 DIAGNOSIS — Z992 Dependence on renal dialysis: Secondary | ICD-10-CM

## 2022-06-24 DIAGNOSIS — N183 Chronic kidney disease, stage 3 unspecified: Secondary | ICD-10-CM | POA: Diagnosis not present

## 2022-06-24 DIAGNOSIS — N186 End stage renal disease: Secondary | ICD-10-CM | POA: Diagnosis not present

## 2022-06-25 ENCOUNTER — Telehealth: Payer: Self-pay

## 2022-06-25 NOTE — Telephone Encounter (Signed)
Called pt to schedule his surgery. VM box is full/unable to leave message.

## 2022-06-26 NOTE — Telephone Encounter (Signed)
Attempted to reach patient to schedule surgery. Unable to leave message- mailbox full.

## 2022-07-01 ENCOUNTER — Other Ambulatory Visit: Payer: Self-pay | Admitting: *Deleted

## 2022-07-01 DIAGNOSIS — N186 End stage renal disease: Secondary | ICD-10-CM

## 2022-07-04 ENCOUNTER — Encounter (HOSPITAL_COMMUNITY): Payer: Self-pay | Admitting: Vascular Surgery

## 2022-07-04 NOTE — Progress Notes (Signed)
Anesthesia Chart Review: Paul [name]  Case: [id number] Date/Time: 07/08/22 0715   Procedure: RIGHT ARTERIOVENOUS (AV) FISTULA CREATION (Right)   Anesthesia type: Monitor Anesthesia Care   Pre-op diagnosis: ESRD   Location: [facility] OR ROOM 11 / [place] [facility]   Surgeons: Paul John, MD       DISCUSSION: Patient is a 57 year old male scheduled for the above procedure. Per Dr. Osie [name] 06/24/22 Progress note, Paul [name] is on hemodialysis TTS and has a right IJ TDC. He now needs permanent HD access.   History includes former smoker, HTN, ESRD (HD initiated 05/13/22), chronic mixed systolic and diastolic CHF, Bipolar disorder, right central retinal vein occlusion and avascular glaucoma (s/p vitrectomy, endolaser panphotocoagulation, insertion of glaucoma seton-shunt 11/29/04), osteoarthritis (left THR 1997; right THR 12/27/99).  [place] admission 05/11/22-05/17/22 for acute on chronic diastolic CHF and acute on chronic renal failure with progression to ESRD. He actually initially presented with epistaxis in setting of stopping all medications 5 months prior.  BP 200's/120s. ENT paced his right nostril for epistaxis control. He also received 1 unit PRBC for anemia. He was given IV Lasix for volume overload. Creatinine 9.01 (up from 3.98). Nephrology consulted. He had previously declined hemodialysis, so discussion whether he would now agree versus need for Hospice consult. He agreed to at least a trial of hemodialysis. IR placed right IJ TDC on 05/13/22 and HD initiated. Known chronic combine CHF dating back to 2010 (LVEF 20-25% 12/16/08) thought secondary to uncontrolled HTN (did not had LHC then due to CKD). LVEF has been somewhat variable, possibly due to inconsistent compliance. Echo repeated on and showed LVEF up to 45-50% (up from 30-35% 06/02/21), no regional wall motion abnormalities, moderate LVH. Discharged medications included Coreg and Imdur.   He had PCP follow-up with Paul Pier, MD on 06/21/22  for chronic medical conditions and hospital follow-up. He had been compliant with dialysis and was starting a regular walking regimen. Denied chest pain, SOB, LE edema. BP 101/62. CHF felt compensated. Continue current medications. Anemia per nephrology. Four month follow-up planned.   Anesthesia team to evaluate on the day of surgery. .    VS:  BP Readings from Last 3 Encounters:  06/24/22 131/84  06/21/22 101/62  05/17/22 (!) 150/87   Pulse Readings from Last 3 Encounters:  06/24/22 80  06/21/22 85  05/17/22 84     PROVIDERS: Paul Pier, MD is PCP  Paul Bumpers, MD is nephrologist - He has not consistently followed with cardiology. He saw Paul Bickers, MD in 2010-2011 for severe HTN with CHF thought to be secondary to NICM. (Notes indicate he was incarcerated from 2015-2020.) Last cardiologist encounter was on 06/05/21 by  Paul Bishop, MD during admission for acute on chronic CHF in setting of acute on chronic renal failure. Volume improved with diuresis and no active CV symptoms, so additional testing deferred given advanced kidney disease. His underlying mental illness also felt to be playing a role in compliance and CHF management. He had also had an overnight event of Paul [name] but no recurrence and continued monitoring recommended.    LABS: For day of surgery. Last results in [facility] show H/H 7.7/24.1 on 05/17/22. A1c 5.5% 06/04/21.   IMAGES: CXR 05/11/22: FINDINGS: The heart is mildly enlarged. Mild pulmonary vascular congestion. Lungs otherwise clear without evidence of focal consolidation or pleural effusion. Thoracic spondylosis. IMPRESSION: Mild cardiomegaly and pulmonary vascular congestion.    EKG: 05/12/22: Normal sinus rhythm Possible Left  atrial enlargement Septal infarct , age undetermined T wave abnormality, consider lateral ischemia Abnormal ECG When compared with ECG of 01-Jun-2021 23:04, T wave abnormality No  significant change was found present Confirmed by Paul [name] ([id number]) on 05/12/2022 2:21:38 AM   CV: Echo 05/12/22: IMPRESSIONS   1. Left ventricular ejection fraction, by estimation, is 45 to 50%. The  left ventricle has mildly decreased function. The left ventricle has no  regional wall motion abnormalities. There is moderate left ventricular  hypertrophy. Left ventricular  diastolic parameters are indeterminate.   2. Right ventricular systolic function was not well visualized. The right  ventricular size is normal. Tricuspid regurgitation signal is inadequate  for assessing PA pressure.   3. The mitral valve is grossly normal. No evidence of mitral valve  regurgitation. No evidence of mitral stenosis.   4. The aortic valve was not well visualized. Aortic valve regurgitation  is not visualized. No aortic stenosis is present.   5. The inferior vena cava is normal in size with <50% respiratory  variability, suggesting right atrial pressure of 8 mmHg.  - Comparison(s): Prior images reviewed side by side. Changes from prior  study are noted. LVEF improved from 30% to 45-50% now. (Comparison LVEF: 20-25% 12/16/08, 45% 02/27/09, 45-50% 09/11/11, 30-35% 05/29/21)   Past Medical History:  Diagnosis Date   Bipolar disorder ([place])    Central retinal artery occlusion    with macular infarction of the right eye. status post posterior vitrectomy and photocoagulation with insertion of a shunt in 2006.   CHF (congestive heart failure) (HCC)    chronic mixed syst/diast. A999333 echo: Systolic function was mildly reduced. The estimated ejection fraction was in 45%, global HK, Grade I Diast dysfxn.   Heart failure    Obesity    Osteoarthritis    s/p right hip replacement in 2001   Schizophrenia ([place])    Severe uncontrolled hypertension     Past Surgical History:  Procedure Laterality Date   IR FLUORO GUIDE CV LINE RIGHT  05/13/2022   IR US GUIDE VASC ACCESS RIGHT  05/13/2022   JOINT REPLACEMENT      right hip replacement   right eye surgery     for glaucoma   TOTAL HIP ARTHROPLASTY Bilateral 1997, 2001    MEDICATIONS: No current facility-administered medications for this encounter.    calcium acetate (PHOSLO) 667 MG capsule   carvedilol (COREG) 6.25 MG tablet   isosorbide mononitrate (IMDUR) 60 MG 24 hr tablet   rosuvastatin (CRESTOR) 5 MG tablet    Myra Gianotti, PA-C Surgical Short Stay/Anesthesiology [facility] Phone [phone] [facility] Phone [phone] 07/04/2022 11:08 AM

## 2022-07-04 NOTE — Progress Notes (Addendum)
PCP - Dr. Karle Plumber Cardiologist - Rosanne Sack, NP Nephrology: Dr. Elmarie Shiley  PPM/ICD - Denies  Chest x-ray - 05/11/2022 EKG - 05/13/2022 Stress Test - Denies ECHO - 05/12/2022 Cardiac Cath - Denies  Sleep study/apnea/CPAP Denies   Non-diabetic  Blood Thinner Instructions: Denies Aspirin Instructions: Denies  ERAS Protcol - No, NPO  COVID TEST- Denies  Anesthesia review: Yes, chart reviewed by Myra Gianotti  Patient verbally denies any shortness of breath, fever, cough and chest pain during phone call   -------------  SDW INSTRUCTIONS given:  Your procedure is scheduled on Monday, July 08, 2022.  Report to [facility] Main Entrance "A" at 5:30 A.M., and check in at the Admitting office.  Call this number if you have problems the morning of surgery:  [phone]   Remember:  Do not eat or drink after midnight the night before your surgery    Take these medicines the morning of surgery with A SIP OF WATER  carvedilol (COREG)  isosorbide mononitrate (IMDUR)  rosuvastatin (CRESTOR)    As of today, STOP taking any Aspirin (unless otherwise instructed by your surgeon) Aleve, Naproxen, Ibuprofen, Motrin, Advil, Goody's, BC's, all herbal medications, fish oil, and all vitamins.                                 Do not wear lotions, powders, colognes, or deodorant. Men may shave face and neck.            Do not bring valuables to the hospital.             [facility] is not responsible for any belongings or valuables.  Do NOT Smoke (Tobacco/Vaping) 24 hours prior to your procedure  If you use a CPAP at night, you may bring all equipment for your overnight stay.   Contacts, glasses, dentures or bridgework may not be worn into surgery.      For patients admitted to the hospital, discharge time will be determined by your treatment team.   Patients discharged the day of surgery will not be allowed to drive home, and someone needs to stay with them for 24  hours.    Special instructions:   [facility]- Preparing For Surgery  Before surgery, you can play an important role. Because skin is not sterile, your skin needs to be as free of germs as possible. You can reduce the number of germs on your skin by washing with dial Soap  before surgery.      Oral Hygiene is also important to reduce your risk of infection.  Remember - BRUSH YOUR TEETH THE MORNING OF SURGERY WITH YOUR REGULAR TOOTHPASTE   Please follow these instructions carefully.   Shower the NIGHT BEFORE SURGERY and the MORNING OF SURGERY with DIAL Soap.   Pat yourself dry with a CLEAN TOWEL.  Wear CLEAN PAJAMAS to bed the night before surgery  Place CLEAN SHEETS on your bed the night of your first shower and   DO NOT SLEEP WITH PETS.   Day of Surgery: Please shower morning of surgery  Wear Clean/Comfortable clothing the morning of surgery Do not apply any deodorants/lotions.   Remember to brush your teeth WITH YOUR REGULAR TOOTHPASTE.   Questions were answered. Patient verbalized understanding of instructions.

## 2022-07-04 NOTE — Anesthesia Preprocedure Evaluation (Addendum)
Anesthesia Evaluation  Patient identified by MRN, date of birth, ID band Patient awake    Reviewed: Allergy & Precautions, NPO status , Patient's Chart, lab work & pertinent test results  Airway Mallampati: II  TM Distance: >3 FB Neck ROM: Full    Dental no notable dental hx.    Pulmonary former smoker   Pulmonary exam normal        Cardiovascular hypertension, Pt. on medications and Pt. on home beta blockers +CHF   Rhythm:Regular Rate:Normal  Echo 05/12/22: IMPRESSIONS  1. Left ventricular ejection fraction, by estimation, is 45 to 50%. The  left ventricle has mildly decreased function. The left ventricle has no  regional wall motion abnormalities. There is moderate left ventricular  hypertrophy. Left ventricular  diastolic parameters are indeterminate.  2. Right ventricular systolic function was not well visualized. The right  ventricular size is normal. Tricuspid regurgitation signal is inadequate  for assessing PA pressure.  3. The mitral valve is grossly normal. No evidence of mitral valve  regurgitation. No evidence of mitral stenosis.  4. The aortic valve was not well visualized. Aortic valve regurgitation  is not visualized. No aortic stenosis is present.  5. The inferior vena cava is normal in size with <50% respiratory  variability, suggesting right atrial pressure of 8 mmHg.  - Comparison(s): Prior images reviewed side by side. Changes from prior  study are noted. LVEF improved from 30% to 45-50% now.    Neuro/Psych     Bipolar Disorder Schizophrenia  negative neurological ROS     GI/Hepatic negative GI ROS, Neg liver ROS,,,  Endo/Other  negative endocrine ROS    Renal/GU ESRFRenal disease  negative genitourinary   Musculoskeletal  (+) Arthritis , Osteoarthritis,    Abdominal Normal abdominal exam  (+)   Peds  Hematology  (+) Blood dyscrasia, anemia Lab Results      Component                 Value               Date                      WBC                      9.0                 05/17/2022                HGB                      7.7 (L)             05/17/2022                HCT                      24.1 (L)            05/17/2022                MCV                      91.6                05/17/2022                PLT  238                 05/17/2022             Lab Results      Component                Value               Date                      NA                       134 (L)             05/17/2022                K                        4.0                 05/17/2022                CO2                      22                  05/17/2022                GLUCOSE                  148 (H)             05/17/2022                BUN                      65 (H)              05/17/2022                CREATININE               7.61 (H)            05/17/2022                CALCIUM                  7.4 (L)             05/17/2022                EGFR                     17 (L)              06/14/2021                GFRNONAA                 8 (L)               05/17/2022              Anesthesia Other Findings   Reproductive/Obstetrics                             Anesthesia Physical Anesthesia Plan  ASA: 3  Anesthesia Plan: MAC and Regional   Post-op Pain Management:  Regional block*   Induction: Intravenous  PONV Risk Score and Plan: 1 and Ondansetron, Dexamethasone, Treatment may vary due to age or medical condition and Propofol infusion  Airway Management Planned: Simple Face Mask and Nasal Cannula  Additional Equipment: None  Intra-op Plan:   Post-operative Plan:   Informed Consent: I have reviewed the patients History and Physical, chart, labs and discussed the procedure including the risks, benefits and alternatives for the proposed anesthesia with the patient or authorized representative who has indicated his/her  understanding and acceptance.     Dental advisory given  Plan Discussed with:   Anesthesia Plan Comments: (PAT note written 07/04/2022 by Myra Gianotti, PA-C.  )       Anesthesia Quick Evaluation

## 2022-07-05 ENCOUNTER — Other Ambulatory Visit: Payer: Self-pay

## 2022-07-05 NOTE — Progress Notes (Signed)
Multiple, multiple attempts to contact patient, VM remains full and unable to leave a message. VM left on sister, Roseanne's cell phone as to date, time, location and medications to take morning of surgery in hopes she is able to contact patient and relay the information.  All attempts at completing SDW call have failed.

## 2022-07-05 NOTE — Progress Notes (Signed)
Patient returned called at 1700, was brisk, wanted basic information.  Patient came across as frustrated as this nurse was trying to go over pre-surgical information, did not like being told to  use anti-bacterial soap or to perform good oral hygiene.  He was able to find a ride from someone to drop him off for surgery.  The rest of patients medical history will need to be verified with patient DOS when his not is  such a rush to get off the phone.

## 2022-07-08 ENCOUNTER — Other Ambulatory Visit (HOSPITAL_COMMUNITY): Payer: Self-pay

## 2022-07-08 ENCOUNTER — Encounter (HOSPITAL_COMMUNITY)
Admission: RE | Disposition: A | Discharge status: Home / Self Care | Payer: Self-pay | Source: Home / Self Care | Attending: Vascular Surgery

## 2022-07-08 ENCOUNTER — Other Ambulatory Visit: Payer: Self-pay

## 2022-07-08 ENCOUNTER — Encounter (HOSPITAL_COMMUNITY): Payer: Self-pay | Admitting: Vascular Surgery

## 2022-07-08 ENCOUNTER — Ambulatory Visit (HOSPITAL_COMMUNITY)
Admission: RE | Admit: 2022-07-08 | Discharge: 2022-07-08 | Disposition: A | Discharge status: Home / Self Care | Payer: Medicaid Other | Attending: Vascular Surgery | Admitting: Vascular Surgery

## 2022-07-08 ENCOUNTER — Ambulatory Visit (HOSPITAL_COMMUNITY): Payer: Medicaid Other | Admitting: Vascular Surgery

## 2022-07-08 DIAGNOSIS — I132 Hypertensive heart and chronic kidney disease with heart failure and with stage 5 chronic kidney disease, or end stage renal disease: Secondary | ICD-10-CM | POA: Insufficient documentation

## 2022-07-08 DIAGNOSIS — M1990 Unspecified osteoarthritis, unspecified site: Secondary | ICD-10-CM | POA: Diagnosis not present

## 2022-07-08 DIAGNOSIS — I5042 Chronic combined systolic (congestive) and diastolic (congestive) heart failure: Secondary | ICD-10-CM | POA: Diagnosis not present

## 2022-07-08 DIAGNOSIS — D631 Anemia in chronic kidney disease: Secondary | ICD-10-CM

## 2022-07-08 DIAGNOSIS — Z87891 Personal history of nicotine dependence: Secondary | ICD-10-CM | POA: Diagnosis not present

## 2022-07-08 DIAGNOSIS — I509 Heart failure, unspecified: Secondary | ICD-10-CM | POA: Diagnosis not present

## 2022-07-08 DIAGNOSIS — N186 End stage renal disease: Secondary | ICD-10-CM | POA: Diagnosis not present

## 2022-07-08 DIAGNOSIS — Z96643 Presence of artificial hip joint, bilateral: Secondary | ICD-10-CM | POA: Diagnosis not present

## 2022-07-08 DIAGNOSIS — N185 Chronic kidney disease, stage 5: Secondary | ICD-10-CM | POA: Diagnosis not present

## 2022-07-08 DIAGNOSIS — Z992 Dependence on renal dialysis: Secondary | ICD-10-CM | POA: Diagnosis not present

## 2022-07-08 HISTORY — PX: AV FISTULA PLACEMENT: SHX1204

## 2022-07-08 HISTORY — DX: Chronic kidney disease, unspecified: N18.9

## 2022-07-08 SURGERY — ARTERIOVENOUS (AV) FISTULA CREATION
Anesthesia: Monitor Anesthesia Care | Site: Arm Upper | Laterality: Right

## 2022-07-08 MED ORDER — CHLORHEXIDINE GLUCONATE 0.12 % MT SOLN
15.0000 mL | Freq: Once | OROMUCOSAL | Status: AC
  Administered 2022-07-08: 15 mL via OROMUCOSAL
  Filled 2022-07-08: qty 15

## 2022-07-08 MED ORDER — PROPOFOL 500 MG/50ML IV EMUL
INTRAVENOUS | Status: DC | PRN
  Administered 2022-07-08: 100 ug/kg/min via INTRAVENOUS

## 2022-07-08 MED ORDER — FENTANYL CITRATE (PF) 250 MCG/5ML IJ SOLN
INTRAMUSCULAR | Status: AC
  Filled 2022-07-08: qty 5

## 2022-07-08 MED ORDER — FENTANYL CITRATE (PF) 100 MCG/2ML IJ SOLN: 25.0000 ug | INTRAMUSCULAR | Status: DC | PRN

## 2022-07-08 MED ORDER — HYDROCODONE-ACETAMINOPHEN 5-325 MG PO TABS
1.0000 | ORAL_TABLET | Freq: Four times a day (QID) | ORAL | 0 refills | Status: DC | PRN
  Filled 2022-07-08: qty 8, 2d supply, fill #0

## 2022-07-08 MED ORDER — ORAL CARE MOUTH RINSE: 15.0000 mL | Freq: Once | OROMUCOSAL | Status: AC

## 2022-07-08 MED ORDER — LIDOCAINE HCL (PF) 1 % IJ SOLN
INTRAMUSCULAR | Status: AC
  Filled 2022-07-08: qty 30

## 2022-07-08 MED ORDER — ACETAMINOPHEN 10 MG/ML IV SOLN: 1000.0000 mg | Freq: Once | INTRAVENOUS | Status: DC | PRN

## 2022-07-08 MED ORDER — 0.9 % SODIUM CHLORIDE (POUR BTL) OPTIME
TOPICAL | Status: DC | PRN
  Administered 2022-07-08: 1000 mL

## 2022-07-08 MED ORDER — CARVEDILOL 3.125 MG PO TABS
ORAL_TABLET | ORAL | Status: AC
  Administered 2022-07-08: 6.25 mg via ORAL
  Filled 2022-07-08: qty 2

## 2022-07-08 MED ORDER — CARVEDILOL 6.25 MG PO TABS: 6.2500 mg | ORAL_TABLET | Freq: Two times a day (BID) | ORAL | Status: DC

## 2022-07-08 MED ORDER — LIDOCAINE-EPINEPHRINE (PF) 1.5 %-1:200000 IJ SOLN
INTRAMUSCULAR | Status: DC | PRN
  Administered 2022-07-08: 30 mL via PERINEURAL

## 2022-07-08 MED ORDER — FENTANYL CITRATE (PF) 250 MCG/5ML IJ SOLN
INTRAMUSCULAR | Status: DC | PRN
  Administered 2022-07-08: 50 ug via INTRAVENOUS

## 2022-07-08 MED ORDER — PROPOFOL 10 MG/ML IV BOLUS
INTRAVENOUS | Status: AC
  Filled 2022-07-08: qty 20

## 2022-07-08 MED ORDER — HEPARIN SODIUM (PORCINE) 1000 UNIT/ML IJ SOLN
INTRAMUSCULAR | Status: DC | PRN
  Administered 2022-07-08: 2000 [IU] via INTRAVENOUS

## 2022-07-08 MED ORDER — CHLORHEXIDINE GLUCONATE 4 % EX LIQD: 60.0000 mL | Freq: Once | CUTANEOUS | Status: DC

## 2022-07-08 MED ORDER — HEPARIN 6000 UNIT IRRIGATION SOLUTION
Status: DC | PRN
  Administered 2022-07-08: 1

## 2022-07-08 MED ORDER — MIDAZOLAM HCL 2 MG/2ML IJ SOLN
INTRAMUSCULAR | Status: DC | PRN
  Administered 2022-07-08 (×2): 1 mg via INTRAVENOUS

## 2022-07-08 MED ORDER — CIPROFLOXACIN IN D5W 400 MG/200ML IV SOLN
400.0000 mg | INTRAVENOUS | Status: AC
  Administered 2022-07-08: 400 mg via INTRAVENOUS
  Filled 2022-07-08: qty 200

## 2022-07-08 MED ORDER — HEPARIN 6000 UNIT IRRIGATION SOLUTION
Status: AC
  Filled 2022-07-08: qty 500

## 2022-07-08 MED ORDER — ONDANSETRON HCL 4 MG/2ML IJ SOLN
INTRAMUSCULAR | Status: DC | PRN
  Administered 2022-07-08: 4 mg via INTRAVENOUS

## 2022-07-08 MED ORDER — CARVEDILOL 12.5 MG PO TABS: 6.2500 mg | ORAL_TABLET | Freq: Once | ORAL | Status: AC

## 2022-07-08 MED ORDER — ONDANSETRON HCL 4 MG/2ML IJ SOLN
INTRAMUSCULAR | Status: AC
  Filled 2022-07-08: qty 2

## 2022-07-08 MED ORDER — MIDAZOLAM HCL 2 MG/2ML IJ SOLN
INTRAMUSCULAR | Status: AC
  Filled 2022-07-08: qty 2

## 2022-07-08 MED ORDER — SODIUM CHLORIDE 0.9 % IV SOLN: INTRAVENOUS | Status: DC

## 2022-07-08 MED ORDER — STERILE WATER FOR IRRIGATION IR SOLN
Status: DC | PRN
  Administered 2022-07-08: 1000 mL

## 2022-07-08 SURGICAL SUPPLY — 30 items
ADH SKN CLS APL DERMABOND .7 (GAUZE/BANDAGES/DRESSINGS) ×1
ARMBAND PINK RESTRICT EXTREMIT (MISCELLANEOUS) ×1 IMPLANT
BAG COUNTER SPONGE SURGICOUNT (BAG) ×1 IMPLANT
BAG SPNG CNTER NS LX DISP (BAG) ×1
BLADE CLIPPER SURG (BLADE) ×1 IMPLANT
BNDG ELASTIC 4X5.8 VLCR STR LF (GAUZE/BANDAGES/DRESSINGS) ×1 IMPLANT
CANISTER SUCT 3000ML PPV (MISCELLANEOUS) ×1 IMPLANT
CLIP VESOCCLUDE MED 6/CT (CLIP) ×1 IMPLANT
COVER PROBE W GEL 5X96 (DRAPES) ×1 IMPLANT
DERMABOND ADVANCED .7 DNX12 (GAUZE/BANDAGES/DRESSINGS) ×1 IMPLANT
ELECT REM PT RETURN 9FT ADLT (ELECTROSURGICAL) ×1
ELECTRODE REM PT RTRN 9FT ADLT (ELECTROSURGICAL) ×1 IMPLANT
GLOVE BIOGEL PI IND STRL 8 (GLOVE) ×1 IMPLANT
GOWN STRL REUS W/ TWL LRG LVL3 (GOWN DISPOSABLE) ×2 IMPLANT
GOWN STRL REUS W/TWL 2XL LVL3 (GOWN DISPOSABLE) ×2 IMPLANT
GOWN STRL REUS W/TWL LRG LVL3 (GOWN DISPOSABLE) ×2
KIT BASIN OR (CUSTOM PROCEDURE TRAY) ×1 IMPLANT
KIT TURNOVER KIT B (KITS) ×1 IMPLANT
NS IRRIG 1000ML POUR BTL (IV SOLUTION) ×1 IMPLANT
PACK CV ACCESS (CUSTOM PROCEDURE TRAY) ×1 IMPLANT
PAD ARMBOARD 7.5X6 YLW CONV (MISCELLANEOUS) ×2 IMPLANT
SLING ARM FOAM STRAP LRG (SOFTGOODS) IMPLANT
SUT MNCRL AB 4-0 PS2 18 (SUTURE) ×1 IMPLANT
SUT PROLENE 6 0 BV (SUTURE) ×1 IMPLANT
SUT SILK 3 0 SH CR/8 (SUTURE) ×1 IMPLANT
SUT VIC AB 3-0 SH 27 (SUTURE) ×1
SUT VIC AB 3-0 SH 27X BRD (SUTURE) ×1 IMPLANT
TOWEL GREEN STERILE (TOWEL DISPOSABLE) ×1 IMPLANT
UNDERPAD 30X36 HEAVY ABSORB (UNDERPADS AND DIAPERS) ×1 IMPLANT
WATER STERILE IRR 1000ML POUR (IV SOLUTION) ×1 IMPLANT

## 2022-07-08 NOTE — Anesthesia Postprocedure Evaluation (Signed)
Anesthesia Post Note  Patient: Yoshiharu Staebell Driscoll  Procedure(s) Performed: RIGHT RADIOCEPHALIC ARTERIOVENOUS (AV) FISTULA CREATION (Right: Arm Upper)     Patient location during evaluation: PACU Anesthesia Type: Regional and MAC Level of consciousness: awake and alert Pain management: pain level controlled Vital Signs Assessment: post-procedure vital signs reviewed and stable Respiratory status: spontaneous breathing, nonlabored ventilation, respiratory function stable and patient connected to nasal cannula oxygen Cardiovascular status: stable and blood pressure returned to baseline Postop Assessment: no apparent nausea or vomiting Anesthetic complications: no   No notable events documented.  Last Vitals:  Vitals:   07/08/22 0915 07/08/22 0930  BP: 112/76 109/68  Pulse: 76 80  Resp: 12 13  Temp:  36.6 C  SpO2: 97% 97%    Last Pain:  Vitals:   07/08/22 0930  TempSrc:   PainSc: 0-No pain                 Belenda Cruise P Yahaira Bruski

## 2022-07-08 NOTE — Progress Notes (Signed)
ISTAT result is not crossing over into EPIC. Paper copy of result placed in patient's chart.

## 2022-07-08 NOTE — Progress Notes (Signed)
Patient awaiting pain medication from our pharmacy to be delivered then transportation to transport home patient is alert talkative skin warm and dry tolerating liquids at this time

## 2022-07-08 NOTE — Discharge Instructions (Signed)
° °  [facility] ° °Discharge Instructions ° °AV Fistula or Graft Surgery for Dialysis Access ° °Please refer to the following instructions for your post-procedure care. Your surgeon or physician assistant will discuss any changes with you. ° °Activity ° °You may drive the day following your surgery, if you are comfortable and no longer taking prescription pain medication. Resume full activity as the soreness in your incision resolves. ° °Bathing/Showering ° °You may shower after you go home. Keep your incision dry for 48 hours. Do not soak in a bathtub, hot tub, or swim until the incision heals completely. You may not shower if you have a hemodialysis catheter. ° °Incision Care ° °Clean your incision with mild soap and water after 48 hours. Pat the area dry with a clean towel. You do not need a bandage unless otherwise instructed. Do not apply any ointments or creams to your incision. You may have skin glue on your incision. Do not peel it off. It will come off on its own in about one week. Your arm may swell a bit after surgery. To reduce swelling use pillows to elevate your arm so it is above your heart. Your doctor will tell you if you need to lightly wrap your arm with an ACE bandage. ° °Diet ° °Resume your normal diet. There are not special food restrictions following this procedure. In order to heal from your surgery, it is CRITICAL to get adequate nutrition. Your body requires vitamins, minerals, and protein. Vegetables are the best source of vitamins and minerals. Vegetables also provide the perfect balance of protein. Processed food has little nutritional value, so try to avoid this. ° °Medications ° °Resume taking all of your medications. If your incision is causing pain, you may take over-the counter pain relievers such as acetaminophen (Tylenol). If you were prescribed a stronger pain medication, please be aware these medications can cause nausea and constipation. Prevent  nausea by taking the medication with a snack or meal. Avoid constipation by drinking plenty of fluids and eating foods with high amount of fiber, such as fruits, vegetables, and grains. Do not take Tylenol if you are taking prescription pain medications. ° ° ° ° °Follow up °Your surgeon may want to see you in the office following your access surgery. If so, this will be arranged at the time of your surgery. ° °Please call us immediately for any of the following conditions: ° °Increased pain, redness, drainage (pus) from your incision site °Fever of 101 degrees or higher °Severe or worsening pain at your incision site °Hand pain or numbness. ° °Reduce your risk of vascular disease: ° °Stop smoking. If you would like help, call [facility] at 1-800-QUIT-NOW (1-800-784-8669) or  at 336-586-4000 ° °Manage your cholesterol °Maintain a desired weight °Control your diabetes °Keep your blood pressure down ° °Dialysis ° °It will take several weeks to several months for your new dialysis access to be ready for use. Your surgeon will determine when it is OK to use it. Your nephrologist will continue to direct your dialysis. You can continue to use your Permcath until your new access is ready for use. ° °If you have any questions, please call the office at [phone]. ° °

## 2022-07-08 NOTE — Op Note (Signed)
    NAME: Paul [name]    MRN: [id number] DOB: [birth year]    DATE OF OPERATION: 07/08/2022  PREOP DIAGNOSIS:    End stage renal disease  POSTOP DIAGNOSIS:    Same  PROCEDURE:    Right arm brachiocephalic fistula creation  SURGEON: Broadus John  ASSIST: Dagoberto Ligas, PA  ANESTHESIA: Moderate, block  EBL: 29m  INDICATIONS:    [name] a 57y.o. male with end stage renal disease in need for long term HD access. After discussing the risks and benefits of right arm brachiocephalic fistula creation, [name] to proceed.   FINDINGS:   [id number] vein 417mbrachial artery  TECHNIQUE:   The patient was brought to the operating room and placed in supine position. The right arm was prepped and draped in standard fashion. IV antibiotics were administered. A timeout was performed.   The case began with ultrasound insonation of the brachial artery and cephalic vein, which demonstrated sufficient size at the antecubital fossa for arteriovenous fistula.   A transverse incision was made below the elbow creese in the antecubital fossa. The  cephalic vein was isolated for 3 cm in length. Next the aponeurosis was partially released and the brachial artery secured with a vessel loop. The patient was heparinized. The cephalic vein was transected and ligated distally with a 2-0 silk stick-tie. The vein was dilated with coronary dilators and flushed with heparin saline. Vascular clamps were placed proximally and distally on the brachial artery and a 5 mm arteriotomy was created on the brachial artery. This was flushed with heparin saline.  An anastomosis was created in end to side fashion on the brachial artery using running 6-0 Prolene suture.  Prior to completing the anastomosis, the vessels were flushed and the suture line was tied down. There was an excellent thrill in the cephalic vein from the anastomosis to the mid upper bicipital region. The patient had a  multiphasic radial and ulnar signal. He had an excellent doppler signal in the fistula. The incision was irrigated and hemostasis achieved with cautery and suture. The deeper tissue was closed with 3-0 Vicryl and the skin closed with 4-0 Monocryl.  Dermabond was applied to the incisions. The patient was transferred to PACU in stable condition.  Given the complexity of the case,  the assistant was necessary in order to expedient the procedure and safely perform the technical aspects of the operation.  The assistant provided traction and countertraction to assist with exposure of the artery and vein.  They also assisted with suture ligation of multiple venous branches.  They played a critical role in the anastomosis. These skills, especially following the Prolene suture for the anastomosis, could not have been adequately performed by a scrub tech assistant.    JoMacie BurowsMD [facility]:   07/08/2022

## 2022-07-08 NOTE — Anesthesia Procedure Notes (Signed)
Anesthesia Regional Block: Supraclavicular block   Pre-Anesthetic Checklist: , timeout performed,  Correct Patient, Correct Site, Correct Laterality,  Correct Procedure, Correct Position, site marked,  Risks and benefits discussed,  Surgical consent,  Pre-op evaluation,  At surgeon's request and post-op pain management  Laterality: Right  Prep: Dura Prep       Needles:  Injection technique: Single-shot  Needle Type: Echogenic Stimulator Needle     Needle Length: 5cm  Needle Gauge: 20     Additional Needles:   Procedures:,,,, ultrasound used (permanent image in chart),,    Narrative:  Start time: 07/08/2022 6:55 AM End time: 07/08/2022 7:01 AM Injection made incrementally with aspirations every 5 mL.  Performed by: Personally  Anesthesiologist: Darral Dash, DO  Additional Notes: Patient identified. Risks/Benefits/Options discussed with patient including but not limited to bleeding, infection, nerve damage, failed block, incomplete pain control. Patient expressed understanding and wished to proceed. All questions were answered. Sterile technique was used throughout the entire procedure. Please see nursing notes for vital signs. Aspirated in 5cc intervals with injection for negative confirmation. Patient was given instructions on fall risk and not to get out of bed. All questions and concerns addressed with instructions to call with any issues or inadequate analgesia.

## 2022-07-08 NOTE — H&P (Signed)
Office Note   Patient seen and examined in preop holding.  No complaints. No changes to medication history or physical exam since last seen in clinic. After discussing the risks and benefits of right arm brachiocephalic fistula, Lasandra Beech Sellen elected to proceed.   Broadus John MD   CC:  ESRD Requesting Provider:  No ref. provider found  HPI: DEXTIN MARCUS is a Right handed 57 y.o. (Feb 14, 1966) male with kidney disease who presents at the request of No ref. provider found for permanent HD access. The patient has had no prior access procedures. Per pt, previous tunneled lines have been placed in right IJ. Current access is Right IJ. Dialysis days are TTS.   On exam, Allie was doing well, no complaints.  He was frustrated by his poor kidney function, and blamed 'going against evil' as the reason his kidney's failed.  He feels his kidneys will improve as he is here for a larger purpose, and plans to leave a national repentance to combat evil.   He had questions regarding dialysis, as well as how to get on the transplant list.   Past Medical History:  Diagnosis Date   Bipolar disorder ([place])    Central retinal artery occlusion    with macular infarction of the right eye. status post posterior vitrectomy and photocoagulation with insertion of a shunt in 2006.   CHF (congestive heart failure) (H[facility])    chronic mixed syst/diast. [id number] echo: Systolic function was mildly reduced. The estimated ejection fraction was in 45%, global HK, Grade I Diast dysfxn.   Chronic kidney disease    ESRD   Heart failure    Obesity    Osteoarthritis    s/p right hip replacement in 2001   Schizophrenia ([place])    Severe uncontrolled hypertension     Past Surgical History:  Procedure Laterality Date   IR FLUORO GUIDE CV LINE RIGHT  05/13/2022   IR US GUIDE VASC ACCESS RIGHT  05/13/2022   JOINT REPLACEMENT     right hip replacement   right eye surgery     for glaucoma   TOTAL HIP ARTHROPLASTY  Bilateral 1997, 2001    Social History   Socioeconomic History   Marital status: Legally Separated    Spouse name: Not on file   Number of children: Not on file   Years of education: Not on file   Highest education level: Not on file  Occupational History   Occupation: Furniture conservator/restorer  Tobacco Use   Smoking status: Former   Smokeless tobacco: Never   Tobacco comments:    Quit smoking 20-30 years ago, smoked for 1 year.  Vaping Use   Vaping Use: Never used  Substance and Sexual Activity   Alcohol use: No   Drug use: No   Sexual activity: Yes    Birth control/protection: None  Other Topics Concern   Not on file  Social History Narrative   Not on file   Social Determinants of Health   Financial Resource Strain: Not on file  Food Insecurity: No Food Insecurity (05/12/2022)   Hunger Vital Sign    Worried About Running Out of Food in the Last Year: Never true    Ran Out of Food in the Last Year: Never true  Transportation Needs: No Transportation Needs (05/12/2022)   PRAPARE - Hydrologist (Medical): No    Lack of Transportation (Non-Medical): No  Physical Activity: Not on file  Stress: Not on file  Social Connections: Not on file  Intimate Partner Violence: Not At Risk (05/12/2022)   Humiliation, Afraid, Rape, and Kick questionnaire    Fear of Current or Ex-Partner: No    Emotionally Abused: No    Physically Abused: No    Sexually Abused: No   Family History  Problem Relation Age of Onset   Hypertension Father    Stroke Father     Current Facility-Administered Medications  Medication Dose Route Frequency Provider Last Rate Last Admin   0.9 %  sodium chloride infusion   Intravenous Continuous Broadus John, MD   New Bag at 07/08/22 0650   chlorhexidine (HIBICLENS) 4 % liquid 4 Application  60 mL Topical Once Broadus John, MD       And   [START ON 07/09/2022] chlorhexidine (HIBICLENS) 4 % liquid 4 Application  60 mL Topical Once Broadus John, MD       ciprofloxacin (CIPRO) IVPB 400 mg  400 mg Intravenous 60 min Pre-Op Broadus John, MD       Facility-Administered Medications Ordered in Other Encounters  Medication Dose Route Frequency Provider Last Rate Last Admin   fentaNYL citrate (PF) (SUBLIMAZE) injection   Intravenous Anesthesia Intra-op Heide Scales, CRNA   50 mcg at 07/08/22 [id number]   lidocaine-EPINEPHrine 1.5 %-1:200000 injection   Peri-NEURAL Anesthesia Intra-op Stoltzfus, Belenda Cruise P, DO   30 mL at 07/08/22 0700   midazolam (VERSED) injection   Intravenous Anesthesia Intra-op Heide Scales, CRNA   1 mg at 07/08/22 [id number]    Allergies  Allergen Reactions   Penicillins Other (See Comments)    childhood     REVIEW OF SYSTEMS:  '[X]'$  denotes positive finding, '[ ]'$  denotes negative finding Cardiac  Comments:  Chest pain or chest pressure:    Shortness of breath upon exertion:    Short of breath when lying flat:    Irregular heart rhythm:        Vascular    Pain in calf, thigh, or hip brought on by ambulation:    Pain in feet at night that wakes you up from your sleep:     Blood clot in your veins:    Leg swelling:         Pulmonary    Oxygen at home:    Productive cough:     Wheezing:         Neurologic    Sudden weakness in arms or legs:     Sudden numbness in arms or legs:     Sudden onset of difficulty speaking or slurred speech:    Temporary loss of vision in one eye:     Problems with dizziness:         Gastrointestinal    Blood in stool:     Vomited blood:         Genitourinary    Burning when urinating:     Blood in urine:        Psychiatric    Major depression:         Hematologic    Bleeding problems:    Problems with blood clotting too easily:        Skin    Rashes or ulcers:        Constitutional    Fever or chills:      PHYSICAL EXAMINATION:  Vitals:   07/08/22 0614  BP: (!) 135/95  Pulse: 72  Resp: 17  Temp: 98.9 F (37.2 C)  TempSrc: Oral  SpO2: 97%   Weight: 78 kg  Height: '5\' 3"'$  (1.6 m)    General:  WDWN in NAD; vital signs documented above Gait: Not observed HENT: WNL, normocephalic Pulmonary: normal non-labored breathing , without Rales, rhonchi,  wheezing Cardiac: regular HR,  Abdomen: soft, NT, no masses Skin: without rashes Vascular Exam/Pulses:  Right Left  Radial 2+ (normal) 2+ (normal)  Ulnar 2+ (normal) 2+ (normal)                   Extremities: without ischemic changes, without Gangrene , without cellulitis; without open wounds;  Musculoskeletal: no muscle wasting or atrophy  Neurologic: A&O X 3;  No focal weakness or paresthesias are detected Psychiatric:  The pt has Normal affect.   Non-Invasive Vascular Imaging:   Patient with adequate superficial veins in bilateral extremities-right cephalic and basilic, left basilic    ASSESSMENT/PLAN:  TEMILOLUWA SPURLOCK is a 57 y.o. male who presents with end stage renal disease  Based on vein mapping and examination, Fabio would be best served with right-sided brachiocephalic fistula.  We discussed left-sided brachiobasilic fistula as he is right-handed, but he does not want to undergo 2 surgeries for access. I had an extensive discussion with this patient in regards to the nature of access surgery, including risk, benefits, and alternatives.   The patient is aware that the risks of access surgery include but are not limited to: bleeding, infection, steal syndrome, nerve damage, ischemic monomelic neuropathy, failure of access to mature, complications related to venous hypertension, and possible need for additional access procedures in the future. The patient has  agreed to proceed with the above procedure which will be scheduled at his convenience.  Broadus John, MD [facility] [phone]

## 2022-07-08 NOTE — Anesthesia Procedure Notes (Signed)
Procedure Name: MAC Date/Time: 07/08/2022 7:39 AM  Performed by: Heide Scales, CRNAPre-anesthesia Checklist: Patient identified, Emergency Drugs available, Suction available and Patient being monitored Patient Re-evaluated:Patient Re-evaluated prior to induction Oxygen Delivery Method: Simple face mask Preoxygenation: Pre-oxygenation with 100% oxygen Induction Type: IV induction Dental Injury: Teeth and Oropharynx as per pre-operative assessment

## 2022-07-08 NOTE — Transfer of Care (Signed)
Immediate Anesthesia Transfer of Care Note  Patient: Paul [name]  Procedure(s) Performed: RIGHT RADIOCEPHALIC ARTERIOVENOUS (AV) FISTULA CREATION (Right: Arm Upper)  Patient Location: PACU  Anesthesia Type:MAC and Regional  Level of Consciousness: drowsy and patient cooperative  Airway & Oxygen Therapy: Patient Spontanous Breathing and Patient connected to face mask oxygen  Post-op Assessment: Report given to RN and Post -op Vital signs reviewed and stable  Post vital signs: Reviewed and stable  Last Vitals:  Vitals Value Taken Time  BP 124/70 07/08/22 0853  Temp    Pulse 81 07/08/22 0854  Resp 15 07/08/22 0854  SpO2 97 % 07/08/22 0854  Vitals shown include unvalidated device data.  Last Pain:  Vitals:   07/08/22 0630  TempSrc:   PainSc: 0-No pain      Patients Stated Pain Goal: 0 ([id number])  Complications: No notable events documented.

## 2022-07-09 ENCOUNTER — Encounter (HOSPITAL_COMMUNITY): Payer: Self-pay | Admitting: Vascular Surgery

## 2022-07-09 LAB — POCT I-STAT, CHEM 8
BUN: 68 mg/dL — ABNORMAL HIGH (ref 6–20)
Calcium, Ion: 0.89 mmol/L — CL (ref 1.15–1.40)
Chloride: 97 mmol/L — ABNORMAL LOW (ref 98–111)
Creatinine, Ser: 12.7 mg/dL — ABNORMAL HIGH (ref 0.61–1.24)
Glucose, Bld: 93 mg/dL (ref 70–99)
HCT: 33 % — ABNORMAL LOW (ref 39.0–52.0)
Hemoglobin: 11.2 g/dL — ABNORMAL LOW (ref 13.0–17.0)
Potassium: 3.6 mmol/L (ref 3.5–5.1)
Sodium: 135 mmol/L (ref 135–145)
TCO2: 22 mmol/L (ref 22–32)

## 2022-07-12 ENCOUNTER — Other Ambulatory Visit (HOSPITAL_COMMUNITY): Payer: Self-pay

## 2022-07-25 ENCOUNTER — Other Ambulatory Visit (HOSPITAL_COMMUNITY): Payer: Self-pay

## 2022-07-25 MED ORDER — VELPHORO 500 MG PO CHEW
500.0000 mg | CHEWABLE_TABLET | Freq: Three times a day (TID) | ORAL | 11 refills | Status: DC
  Filled 2022-07-25: qty 90, 30d supply, fill #0

## 2022-07-26 ENCOUNTER — Other Ambulatory Visit (HOSPITAL_COMMUNITY): Payer: Self-pay

## 2022-08-02 ENCOUNTER — Other Ambulatory Visit (HOSPITAL_COMMUNITY): Payer: Self-pay

## 2022-08-07 ENCOUNTER — Other Ambulatory Visit: Payer: Self-pay

## 2022-08-08 ENCOUNTER — Other Ambulatory Visit (HOSPITAL_COMMUNITY): Payer: Self-pay

## 2022-08-09 ENCOUNTER — Other Ambulatory Visit: Payer: Self-pay

## 2022-08-09 ENCOUNTER — Other Ambulatory Visit (HOSPITAL_COMMUNITY): Payer: Self-pay

## 2022-08-09 MED ORDER — VELPHORO 500 MG PO CHEW
1000.0000 mg | CHEWABLE_TABLET | Freq: Three times a day (TID) | ORAL | 11 refills | Status: DC
  Filled 2022-08-09 – 2022-09-25 (×4): qty 180, 30d supply, fill #0
  Filled 2023-01-02: qty 180, 30d supply, fill #1
  Filled 2023-03-24 – 2023-03-28 (×2): qty 180, 30d supply, fill #2

## 2022-08-12 ENCOUNTER — Other Ambulatory Visit (HOSPITAL_COMMUNITY): Payer: Self-pay

## 2022-08-14 ENCOUNTER — Other Ambulatory Visit: Payer: Self-pay

## 2022-08-14 DIAGNOSIS — N186 End stage renal disease: Secondary | ICD-10-CM

## 2022-08-23 ENCOUNTER — Ambulatory Visit (INDEPENDENT_AMBULATORY_CARE_PROVIDER_SITE_OTHER): Payer: Medicaid Other | Admitting: Physician Assistant

## 2022-08-23 ENCOUNTER — Other Ambulatory Visit: Payer: Self-pay

## 2022-08-23 ENCOUNTER — Ambulatory Visit (HOSPITAL_COMMUNITY)
Admission: RE | Admit: 2022-08-23 | Discharge: 2022-08-23 | Disposition: A | Discharge status: Home / Self Care | Payer: Medicaid Other | Source: Ambulatory Visit | Attending: Vascular Surgery | Admitting: Vascular Surgery

## 2022-08-23 VITALS — BP 155/84 | HR 88 | Temp 98.2°F | Resp 16 | Ht 63.0 in | Wt 174.7 lb

## 2022-08-23 DIAGNOSIS — N186 End stage renal disease: Secondary | ICD-10-CM | POA: Insufficient documentation

## 2022-08-23 DIAGNOSIS — Z992 Dependence on renal dialysis: Secondary | ICD-10-CM

## 2022-08-23 MED ORDER — SODIUM CHLORIDE 0.9% FLUSH: 3.0000 mL | INTRAVENOUS | Status: DC | PRN

## 2022-08-23 MED ORDER — SODIUM CHLORIDE 0.9% FLUSH: 3.0000 mL | Freq: Two times a day (BID) | INTRAVENOUS | Status: DC

## 2022-08-23 MED ORDER — SODIUM CHLORIDE 0.9 % IV SOLN: 250.0000 mL | INTRAVENOUS | Status: DC | PRN

## 2022-08-23 NOTE — Progress Notes (Signed)
POST OPERATIVE OFFICE NOTE    CC:  F/u for surgery  HPI:  Paul [name] is a 57 y.o. male who is s/p creation of right brachiocephalic AV fistula by Dr. Karin Lieu on 07/08/2022.  This was done to create permanent dialysis access.  He currently dialyzes via right IJ TDC.  Pt returns today for follow up.  Pt states he has been doing well since surgery.  He denies any issues with his incision.  He denies any symptoms of steal such as right hand weakness/numbness, excessive coldness, difficulties with grip strength.  He dialyzes on Tuesdays, Thursdays, and Saturdays via right IJ TDC.   Allergies  Allergen Reactions   Penicillins Other (See Comments)    childhood    Current Outpatient Medications  Medication Sig Dispense Refill   calcium acetate (PHOSLO) 667 MG capsule Take 2 capsules (1,334 mg total) by mouth 3 (three) times daily with meals. 180 capsule 12   carvedilol (COREG) 6.25 MG tablet Take 1 tablet (6.25 mg total) by mouth 2 (two) times daily with a meal. 60 tablet 12   HYDROcodone-acetaminophen (NORCO) 5-325 MG tablet Take 1 tablet by mouth every 6 (six) hours as needed for moderate pain. 8 tablet 0   isosorbide mononitrate (IMDUR) 60 MG 24 hr tablet Take 1 tablet (60 mg total) by mouth daily. 30 tablet 12   rosuvastatin (CRESTOR) 5 MG tablet Take 1 tablet (5 mg total) by mouth daily. 30 tablet 12   sucroferric oxyhydroxide (VELPHORO) 500 MG chewable tablet Chew 1 tablet (500 mg total) by mouth with breakfast, with lunch, and with evening meal. 90 tablet 11   sucroferric oxyhydroxide (VELPHORO) 500 MG chewable tablet Chew 2 tablets (1,000 mg total) by mouth with breakfast, with lunch, and with evening meal. 180 tablet 11   No current facility-administered medications for this visit.     ROS:  See HPI  Physical Exam:  Incision: Right AC fossa incision well-healed Extremities: Right brachiocephalic fistula with good thrill throughout the course of the upper arm, slightly  pulsatile.  Not easily visualized.  2+ right radial pulse Neuro: intact motor and sensation of RUE  Dialysis duplex  (08/23/2022) +--------------------+----------+-----------------+--------+  AVF                PSV (cm/s)Flow Vol (mL/min)Comments  +--------------------+----------+-----------------+--------+  Native artery inflow   140          1046                 +--------------------+----------+-----------------+--------+  AVF Anastomosis        514                               +--------------------+----------+-----------------+--------+     +------------+---------+-------------+---------+---------------------------  ---+  OUTFLOW VEIN   PSV   Diameter (cm)  Depth             Describe                           (cm/s)                 (cm)                                    +------------+---------+-------------+---------+---------------------------  ---+  Shoulder      127       0.73  0.62                                    +------------+---------+-------------+---------+---------------------------  ---+  Prox UA        128       0.76       0.46                                    +------------+---------+-------------+---------+---------------------------  ---+  Mid UA         146       0.75       0.29                                    +------------+---------+-------------+---------+---------------------------  ---+  Dist UA        266       0.58       0.58            joins branch            +------------+---------+-------------+---------+---------------------------  ---+  AC Fossa       516       0.59       0.46     Competing branch  measuring                                                           0.4cm                +------------+---------+-------------+---------+---------------------------     Assessment/Plan:  This is a 57 y.o. male who is s/p: creation of right brachiocephalic fistula on  07/08/2022   -He recently underwent creation of right brachiocephalic fistula by Dr. Karin Lieu on 07/08/2022.  His postoperative course has been uncomplicated -His right arm incision is well-healed without issues.  He denies any symptoms of steal.  He has intact motor and sensation of the right upper extremity with a 2+ palpable right radial pulse. -Duplex of the right brachiocephalic fistula demonstrates that it is maturing well, with size greater than 4 mm throughout the arm.  Flow is 1046 mL/min.  It is deeper than 4 mm and most of the arm, one spot in the mid upper arm at 2.9 mm depth.  Duplex also demonstrates a competing branch in the [facility] fossa measuring 4 mm and elevated velocities at 516 cm/s. -I have explained to the patient that he will likely require fistulogram with possible balloon angioplasty at the [facility] fossa stenosis with elevated velocities, to help mature his fistula.  This can be done in the next 2 to 3 weeks with Dr. Karin Lieu, on a nondialysis day.  The patient is not on any blood thinners. -Of note, he may also require superficialization with branch ligation of the fistula due to its current depth.  At this time we will first pursue fistulogram only   Loel Dubonnet, PA-C [facility] [phone]   Clinic MD:  Randie Heinz

## 2022-09-04 ENCOUNTER — Ambulatory Visit (HOSPITAL_COMMUNITY)
Admission: RE | Admit: 2022-09-04 | Discharge: 2022-09-04 | Disposition: A | Discharge status: Home / Self Care | Payer: Medicare Other | Attending: Vascular Surgery | Admitting: Vascular Surgery

## 2022-09-04 SURGERY — A/V FISTULAGRAM
Anesthesia: LOCAL | Laterality: Right

## 2022-09-04 NOTE — H&P (Signed)
POST OPERATIVE OFFICE NOTE  Patient seen and examined in preop holding.  No complaints. No changes to medication history or physical exam since last seen in clinic. Excellent thrill, size and depth. Flow volume over 1L.  Pt can forego fistulagram at this time and the fistula can be accessed for dialysis  Victorino Sparrow MD    CC:  F/u for surgery  HPI:  Paul [name] is a 57 y.o. male who is s/p creation of right brachiocephalic AV fistula by Dr. Karin Lieu on 07/08/2022.  This was done to create permanent dialysis access.  He currently dialyzes via right IJ TDC.  Pt returns today for follow up.  Pt states he has been doing well since surgery.  He denies any issues with his incision.  He denies any symptoms of steal such as right hand weakness/numbness, excessive coldness, difficulties with grip strength.  He dialyzes on Tuesdays, Thursdays, and Saturdays via right IJ TDC.   Allergies  Allergen Reactions   Penicillins Other (See Comments)    childhood    No current facility-administered medications for this encounter.     ROS:  See HPI  Physical Exam:  Incision: Right AC fossa incision well-healed Extremities: Right brachiocephalic fistula with good thrill throughout the course of the upper arm, slightly pulsatile.  Not easily visualized.  2+ right radial pulse Neuro: intact motor and sensation of RUE  Dialysis duplex  (08/23/2022) +--------------------+----------+-----------------+--------+  AVF                PSV (cm/s)Flow Vol (mL/min)Comments  +--------------------+----------+-----------------+--------+  Native artery inflow   140          1046                 +--------------------+----------+-----------------+--------+  AVF Anastomosis        514                               +--------------------+----------+-----------------+--------+     +------------+---------+-------------+---------+---------------------------  ---+  OUTFLOW VEIN   PSV    Diameter (cm)  Depth             Describe                           (cm/s)                 (cm)                                    +------------+---------+-------------+---------+---------------------------  ---+  Shoulder      127       0.73       0.62                                    +------------+---------+-------------+---------+---------------------------  ---+  Prox UA        128       0.76       0.46                                    +------------+---------+-------------+---------+---------------------------  ---+  Mid UA         146       0.75  0.29                                    +------------+---------+-------------+---------+---------------------------  ---+  Dist UA        266       0.58       0.58            joins branch            +------------+---------+-------------+---------+---------------------------  ---+  AC Fossa       516       0.59       0.46     Competing branch  measuring                                                           0.4cm                +------------+---------+-------------+---------+---------------------------     Assessment/Plan:  This is a 57 y.o. male who is s/p: creation of right brachiocephalic fistula on 07/08/2022   -He recently underwent creation of right brachiocephalic fistula by Dr. Karin Lieu on 07/08/2022.  His postoperative course has been uncomplicated -His right arm incision is well-healed without issues.  He denies any symptoms of steal.  He has intact motor and sensation of the right upper extremity with a 2+ palpable right radial pulse. -Duplex of the right brachiocephalic fistula demonstrates that it is maturing well, with size greater than 4 mm throughout the arm.  Flow is 1046 mL/min.  It is deeper than 4 mm and most of the arm, one spot in the mid upper arm at 2.9 mm depth.  Duplex also demonstrates a competing branch in the [facility] fossa measuring 4 mm and elevated velocities  at 516 cm/s. -I have explained to the patient that he will likely require fistulogram with possible balloon angioplasty at the [facility] fossa stenosis with elevated velocities, to help mature his fistula.  This can be done in the next 2 to 3 weeks with Dr. Karin Lieu, on a nondialysis day.  The patient is not on any blood thinners. -Of note, he may also require superficialization with branch ligation of the fistula due to its current depth.  At this time we will first pursue fistulogram only   Loel Dubonnet, PA-C [facility] [phone]   Clinic MD:  Randie Heinz

## 2022-09-12 ENCOUNTER — Other Ambulatory Visit: Payer: Self-pay

## 2022-09-12 ENCOUNTER — Other Ambulatory Visit (HOSPITAL_COMMUNITY): Payer: Self-pay

## 2022-09-12 MED ORDER — LIDOCAINE-PRILOCAINE 2.5-2.5 % EX CREA
TOPICAL_CREAM | CUTANEOUS | 11 refills | Status: DC
  Filled 2022-09-12: qty 30, 30d supply, fill #0

## 2022-09-17 ENCOUNTER — Other Ambulatory Visit (HOSPITAL_COMMUNITY): Payer: Self-pay

## 2022-09-17 ENCOUNTER — Other Ambulatory Visit: Payer: Self-pay

## 2022-09-17 MED ORDER — VELPHORO 500 MG PO CHEW
1000.0000 mg | CHEWABLE_TABLET | Freq: Three times a day (TID) | ORAL | 3 refills | Status: DC
  Filled 2022-09-17: qty 180, 30d supply, fill #0

## 2022-09-17 MED ORDER — CARVEDILOL 6.25 MG PO TABS
6.2500 mg | ORAL_TABLET | Freq: Two times a day (BID) | ORAL | 3 refills | Status: DC
  Filled 2022-09-17: qty 180, 90d supply, fill #0

## 2022-09-17 MED ORDER — ISOSORBIDE MONONITRATE ER 60 MG PO TB24
60.0000 mg | ORAL_TABLET | Freq: Every day | ORAL | 3 refills | Status: DC
  Filled 2022-09-17: qty 90, 90d supply, fill #0

## 2022-09-18 ENCOUNTER — Telehealth: Payer: Self-pay

## 2022-09-18 NOTE — Telephone Encounter (Signed)
Brianna from HD center called to f/u on pt's fistulogram on 09/04/22 and see when they can access his AVF. Per MD note, fistulogram was not necessary and his AVF can be accessed. I read Colin Mulders this note-no further questions/concerns at this time.

## 2022-09-19 ENCOUNTER — Other Ambulatory Visit (HOSPITAL_COMMUNITY): Payer: Self-pay

## 2022-09-20 ENCOUNTER — Other Ambulatory Visit (HOSPITAL_COMMUNITY): Payer: Self-pay

## 2022-09-24 ENCOUNTER — Other Ambulatory Visit: Payer: Self-pay

## 2022-09-24 ENCOUNTER — Other Ambulatory Visit (HOSPITAL_BASED_OUTPATIENT_CLINIC_OR_DEPARTMENT_OTHER): Payer: Self-pay

## 2022-09-24 ENCOUNTER — Other Ambulatory Visit (HOSPITAL_COMMUNITY): Payer: Self-pay

## 2022-09-25 ENCOUNTER — Other Ambulatory Visit (HOSPITAL_COMMUNITY): Payer: Self-pay

## 2022-09-26 ENCOUNTER — Other Ambulatory Visit: Payer: Self-pay

## 2022-09-26 ENCOUNTER — Other Ambulatory Visit (HOSPITAL_COMMUNITY): Payer: Self-pay

## 2022-10-17 ENCOUNTER — Other Ambulatory Visit (HOSPITAL_COMMUNITY): Payer: Self-pay

## 2022-10-29 ENCOUNTER — Ambulatory Visit: Payer: Medicaid Other | Admitting: Internal Medicine

## 2022-11-21 ENCOUNTER — Other Ambulatory Visit (HOSPITAL_COMMUNITY): Payer: Self-pay

## 2022-11-21 MED ORDER — ROSUVASTATIN CALCIUM 5 MG PO TABS
5.0000 mg | ORAL_TABLET | Freq: Every day | ORAL | 11 refills | Status: DC
  Filled 2022-11-21 – 2023-01-02 (×2): qty 30, 30d supply, fill #0
  Filled 2023-02-17: qty 30, 30d supply, fill #1

## 2022-11-22 ENCOUNTER — Other Ambulatory Visit (HOSPITAL_COMMUNITY): Payer: Self-pay

## 2022-11-29 ENCOUNTER — Ambulatory Visit: Payer: Medicare Other | Attending: Internal Medicine | Admitting: Internal Medicine

## 2022-11-29 ENCOUNTER — Encounter: Payer: Self-pay | Admitting: Internal Medicine

## 2022-11-29 VITALS — BP 118/77 | HR 86 | Temp 98.2°F | Ht 63.0 in | Wt 166.0 lb

## 2022-11-29 DIAGNOSIS — I132 Hypertensive heart and chronic kidney disease with heart failure and with stage 5 chronic kidney disease, or end stage renal disease: Secondary | ICD-10-CM | POA: Diagnosis not present

## 2022-11-29 DIAGNOSIS — N186 End stage renal disease: Secondary | ICD-10-CM | POA: Diagnosis not present

## 2022-11-29 DIAGNOSIS — Z8249 Family history of ischemic heart disease and other diseases of the circulatory system: Secondary | ICD-10-CM | POA: Diagnosis not present

## 2022-11-29 DIAGNOSIS — I5042 Chronic combined systolic (congestive) and diastolic (congestive) heart failure: Secondary | ICD-10-CM | POA: Diagnosis not present

## 2022-11-29 DIAGNOSIS — I120 Hypertensive chronic kidney disease with stage 5 chronic kidney disease or end stage renal disease: Secondary | ICD-10-CM

## 2022-11-29 DIAGNOSIS — Z713 Dietary counseling and surveillance: Secondary | ICD-10-CM | POA: Diagnosis not present

## 2022-11-29 DIAGNOSIS — E785 Hyperlipidemia, unspecified: Secondary | ICD-10-CM | POA: Diagnosis not present

## 2022-11-29 DIAGNOSIS — E782 Mixed hyperlipidemia: Secondary | ICD-10-CM | POA: Diagnosis not present

## 2022-11-29 DIAGNOSIS — Z992 Dependence on renal dialysis: Secondary | ICD-10-CM | POA: Insufficient documentation

## 2022-11-29 NOTE — Progress Notes (Signed)
Patient ID: Paul [name], male    DOB: Nov 28, 1965  MRN: [id number]  CC: Hypertension (HTN f/u./No questions / concerns/ No t s)   Subjective: Paul [name] is a 57 y.o. male who presents for chronic ds management His concerns today include:  Patient with history of HTN, HL, combined systolic and diastolic CHF last EF 45-50% 05/2022, ESRD on HD, obesity, bipolar disorder/schizophrenia (listed on problem list but pt denies)   ESRD/HTN/CHF:  still on HD 3 days a wk Still on Isosorbide 60 mg daily and Coreg 6.25 mg BID No CP/SOB/LE edema/HA/dizziness  HL:  taking and tolerating Crestor 5 mg daily   Patient Active Problem List   Diagnosis Date Noted   Acute on chronic diastolic CHF (congestive heart failure) (HCC) 05/12/2022   Metabolic acidosis 05/12/2022   Epistaxis 05/12/2022   Acute on chronic blood loss anemia 05/12/2022   Hypertensive urgency 05/12/2022   Hypocalcemia 05/12/2022   Alcohol use disorder in remission 06/07/2021   Mixed hyperlipidemia 06/07/2021   Legally blind in right eye, as defined in [place] 06/07/2021   Acute on chronic diastolic heart failure (HCC) 06/02/2021   Chronic combined systolic and diastolic CHF (congestive heart failure) (HCC) 06/02/2021   SOB (shortness of breath) 06/02/2021   Acute kidney injury superimposed on chronic kidney disease (HCC) 06/02/2021   Elevated troponin 06/02/2021   Syncope and collapse 09/10/2011   CKD (chronic kidney disease), stage III (HCC) 09/10/2011   Sprain of right thumb 09/10/2011   Bipolar disorder (HCC)    Schizophrenia (HCC)    OBESITY 01/12/2009   Essential hypertension 01/12/2009   CHF 01/12/2009   UNSPEC COMBINED SYSTOLIC&DIASTOLIC HEART FAILURE 01/12/2009   OSTEOARTHRITIS 01/12/2009     Current Outpatient Medications on File Prior to Visit  Medication Sig Dispense Refill   carvedilol (COREG) 6.25 MG tablet Take 1 tablet (6.25 mg total) by mouth 2 (two) times daily with a meal. 180 tablet 3    isosorbide mononitrate (IMDUR) 60 MG 24 hr tablet Take 1 tablet (60 mg total) by mouth daily. 90 tablet 3   lidocaine-prilocaine (EMLA) cream Apply 1 a small amount to skin three times a week apply to AVF 30-60 min prior to dialysis, wrap site w plastic wrap 30 g 11   rosuvastatin (CRESTOR) 5 MG tablet Take 1 tablet (5 mg total) by mouth daily. 30 tablet 12   rosuvastatin (CRESTOR) 5 MG tablet Take 1 tablet by mouth once a day 30 tablet 11   sucroferric oxyhydroxide (VELPHORO) 500 MG chewable tablet Chew 2 tablets (1,000 mg total) by mouth with breakfast, with lunch, and with evening meal. 180 tablet 11   calcium acetate (PHOSLO) 667 MG capsule Take 2 capsules (1,334 mg total) by mouth 3 (three) times daily with meals. (Patient not taking: Reported on 09/02/2022) 180 capsule 12   carvedilol (COREG) 6.25 MG tablet Take 1 tablet (6.25 mg total) by mouth 2 (two) times daily with a meal. 60 tablet 12   isosorbide mononitrate (IMDUR) 60 MG 24 hr tablet Take 1 tablet (60 mg total) by mouth daily. 30 tablet 12   Current Facility-Administered Medications on File Prior to Visit  Medication Dose Route Frequency Provider Last Rate Last Admin   0.9 %  sodium chloride infusion  250 mL Intravenous PRN Victorino Sparrow, MD       sodium chloride flush (NS) 0.9 % injection 3 mL  3 mL Intravenous Q12H Victorino Sparrow, MD       sodium  chloride flush (NS) 0.9 % injection 3 mL  3 mL Intravenous PRN Victorino Sparrow, MD        Allergies  Allergen Reactions   Penicillins Other (See Comments)    childhood    Social History   Socioeconomic History   Marital status: Legally Separated    Spouse name: Not on file   Number of children: Not on file   Years of education: Not on file   Highest education level: Not on file  Occupational History   Occupation: machinist  Tobacco Use   Smoking status: Former   Smokeless tobacco: Never   Tobacco comments:    Quit smoking 20-30 years ago, smoked for 1 year.  Vaping  Use   Vaping status: Never Used  Substance and Sexual Activity   Alcohol use: No   Drug use: No   Sexual activity: Yes    Birth control/protection: None  Other Topics Concern   Not on file  Social History Narrative   Not on file   Social Determinants of Health   Financial Resource Strain: Not on file  Food Insecurity: No Food Insecurity (05/12/2022)   Hunger Vital Sign    Worried About Running Out of Food in the Last Year: Never true    Ran Out of Food in the Last Year: Never true  Transportation Needs: No Transportation Needs (05/12/2022)   PRAPARE - Administrator, Civil Service (Medical): No    Lack of Transportation (Non-Medical): No  Physical Activity: Not on file  Stress: Not on file  Social Connections: Not on file  Intimate Partner Violence: Not At Risk (05/12/2022)   Humiliation, Afraid, Rape, and Kick questionnaire    Fear of Current or Ex-Partner: No    Emotionally Abused: No    Physically Abused: No    Sexually Abused: No    Family History  Problem Relation Age of Onset   Hypertension Father    Stroke Father     Past Surgical History:  Procedure Laterality Date   AV FISTULA PLACEMENT Right 07/08/2022   Procedure: RIGHT RADIOCEPHALIC ARTERIOVENOUS (AV) FISTULA CREATION;  Surgeon: Victorino Sparrow, MD;  Location: [facility] OR;  Service: Vascular;  Laterality: Right;   IR FLUORO GUIDE CV LINE RIGHT  05/13/2022   IR US GUIDE VASC ACCESS RIGHT  05/13/2022   JOINT REPLACEMENT     right hip replacement   right eye surgery     for glaucoma   TOTAL HIP ARTHROPLASTY Bilateral 1997, 2001    ROS: Review of Systems Negative except as stated above  PHYSICAL EXAM: BP 118/77 (BP Location: Left Arm, Patient Position: Sitting, Cuff Size: Normal)   Pulse 86   Temp 98.2 F (36.8 C) (Oral)   Ht 5\' 3"  (1.6 m)   Wt 166 lb (75.3 kg)   SpO2 98%   BMI 29.41 kg/m   Physical Exam   General appearance - alert, well appearing, older Caucasian male and in no  distress Mental status - normal mood, behavior, speech, dress, motor activity, and thought processes Neck - supple, no significant adenopathy Chest - clear to auscultation, no wheezes, rales or rhonchi, symmetric air entry Heart - normal rate, regular rhythm, normal S1, S2, no murmurs, rubs, clicks or gallops Extremities - peripheral pulses normal, no pedal edema, no clubbing or cyanosis     Latest Ref Rng & Units 07/08/2022    6:29 AM 05/17/2022    8:11 AM 05/16/2022    1:58 AM  CMP  Glucose 70 - 99 mg/dL 93  960  97   BUN 6 - 20 mg/dL 68  65  51   Creatinine 0.61 - 1.24 mg/dL 45.40  9.81  1.91   Sodium 135 - 145 mmol/L 135  134  135   Potassium 3.5 - 5.1 mmol/L 3.6  4.0  3.8   Chloride 98 - 111 mmol/L 97  98  96   CO2 22 - 32 mmol/L  22  26   Calcium 8.9 - 10.3 mg/dL  7.4  7.4    Lipid Panel     Component Value Date/Time   CHOL 163 06/05/2021 0057   TRIG 230 (H) 06/05/2021 0057   HDL 37 (L) 06/05/2021 0057   CHOLHDL 4.4 06/05/2021 0057   VLDL 46 (H) 06/05/2021 0057   LDLCALC 80 06/05/2021 0057    CBC    Component Value Date/Time   WBC 9.0 05/17/2022 0811   RBC 2.63 (L) 05/17/2022 0811   HGB 11.2 (L) 07/08/2022 0629   HCT 33.0 (L) 07/08/2022 0629   PLT 238 05/17/2022 0811   MCV 91.6 05/17/2022 0811   MCH 29.3 05/17/2022 0811   MCHC 32.0 05/17/2022 0811   RDW 12.9 05/17/2022 0811   LYMPHSABS 1.0 05/15/2022 0028   MONOABS 1.7 (H) 05/15/2022 0028   EOSABS 0.2 05/15/2022 0028   BASOSABS 0.0 05/15/2022 0028    ASSESSMENT AND PLAN:  1. ESRD on hemodialysis [facility]) Patient continues with hemodialysis 3 times a week.  2. Hypertension, renal disease, stage 5 chronic kidney disease or end stage renal disease (HCC) Controlled.  Continue isosorbide 60 mg daily and carvedilol 6.25 mg twice a day.  3. Chronic combined systolic and diastolic CHF (congestive heart failure) (HCC) Stable and compensated.  Continue isosorbide and carvedilol  4. Mixed hyperlipidemia Continue  Crestor 5 mg daily.    Patient was given the opportunity to ask questions.  Patient verbalized understanding of the plan and was able to repeat key elements of the plan.   This documentation was completed using Paediatric nurse.  Any transcriptional errors are unintentional.  No orders of the defined types were placed in this encounter.    Requested Prescriptions    No prescriptions requested or ordered in this encounter    No follow-ups on file.  Jonah Blue, MD, FACP

## 2022-12-05 DIAGNOSIS — N186 End stage renal disease: Secondary | ICD-10-CM | POA: Diagnosis not present

## 2022-12-05 DIAGNOSIS — I129 Hypertensive chronic kidney disease with stage 1 through stage 4 chronic kidney disease, or unspecified chronic kidney disease: Secondary | ICD-10-CM | POA: Diagnosis not present

## 2022-12-05 DIAGNOSIS — Z992 Dependence on renal dialysis: Secondary | ICD-10-CM | POA: Diagnosis not present

## 2022-12-06 DIAGNOSIS — N2581 Secondary hyperparathyroidism of renal origin: Secondary | ICD-10-CM | POA: Diagnosis not present

## 2022-12-06 DIAGNOSIS — Z992 Dependence on renal dialysis: Secondary | ICD-10-CM | POA: Diagnosis not present

## 2022-12-06 DIAGNOSIS — N186 End stage renal disease: Secondary | ICD-10-CM | POA: Diagnosis not present

## 2022-12-09 DIAGNOSIS — N186 End stage renal disease: Secondary | ICD-10-CM | POA: Diagnosis not present

## 2022-12-09 DIAGNOSIS — N2581 Secondary hyperparathyroidism of renal origin: Secondary | ICD-10-CM | POA: Diagnosis not present

## 2022-12-09 DIAGNOSIS — Z992 Dependence on renal dialysis: Secondary | ICD-10-CM | POA: Diagnosis not present

## 2022-12-11 DIAGNOSIS — N186 End stage renal disease: Secondary | ICD-10-CM | POA: Diagnosis not present

## 2022-12-11 DIAGNOSIS — Z992 Dependence on renal dialysis: Secondary | ICD-10-CM | POA: Diagnosis not present

## 2022-12-11 DIAGNOSIS — N2581 Secondary hyperparathyroidism of renal origin: Secondary | ICD-10-CM | POA: Diagnosis not present

## 2022-12-13 DIAGNOSIS — N2581 Secondary hyperparathyroidism of renal origin: Secondary | ICD-10-CM | POA: Diagnosis not present

## 2022-12-13 DIAGNOSIS — N186 End stage renal disease: Secondary | ICD-10-CM | POA: Diagnosis not present

## 2022-12-13 DIAGNOSIS — Z992 Dependence on renal dialysis: Secondary | ICD-10-CM | POA: Diagnosis not present

## 2022-12-16 DIAGNOSIS — Z992 Dependence on renal dialysis: Secondary | ICD-10-CM | POA: Diagnosis not present

## 2022-12-16 DIAGNOSIS — N2581 Secondary hyperparathyroidism of renal origin: Secondary | ICD-10-CM | POA: Diagnosis not present

## 2022-12-16 DIAGNOSIS — N186 End stage renal disease: Secondary | ICD-10-CM | POA: Diagnosis not present

## 2022-12-18 ENCOUNTER — Other Ambulatory Visit (HOSPITAL_COMMUNITY): Payer: Self-pay

## 2022-12-18 ENCOUNTER — Other Ambulatory Visit: Payer: Self-pay

## 2022-12-18 DIAGNOSIS — N2581 Secondary hyperparathyroidism of renal origin: Secondary | ICD-10-CM | POA: Diagnosis not present

## 2022-12-18 DIAGNOSIS — Z992 Dependence on renal dialysis: Secondary | ICD-10-CM | POA: Diagnosis not present

## 2022-12-18 DIAGNOSIS — N186 End stage renal disease: Secondary | ICD-10-CM | POA: Diagnosis not present

## 2022-12-18 MED ORDER — ISOSORBIDE MONONITRATE ER 60 MG PO TB24
60.0000 mg | ORAL_TABLET | Freq: Every day | ORAL | 3 refills | Status: DC
  Filled 2022-12-18: qty 90, 90d supply, fill #0

## 2022-12-19 ENCOUNTER — Other Ambulatory Visit: Payer: Self-pay

## 2022-12-20 DIAGNOSIS — N186 End stage renal disease: Secondary | ICD-10-CM | POA: Diagnosis not present

## 2022-12-20 DIAGNOSIS — Z992 Dependence on renal dialysis: Secondary | ICD-10-CM | POA: Diagnosis not present

## 2022-12-20 DIAGNOSIS — N2581 Secondary hyperparathyroidism of renal origin: Secondary | ICD-10-CM | POA: Diagnosis not present

## 2022-12-23 DIAGNOSIS — Z992 Dependence on renal dialysis: Secondary | ICD-10-CM | POA: Diagnosis not present

## 2022-12-23 DIAGNOSIS — N2581 Secondary hyperparathyroidism of renal origin: Secondary | ICD-10-CM | POA: Diagnosis not present

## 2022-12-23 DIAGNOSIS — N186 End stage renal disease: Secondary | ICD-10-CM | POA: Diagnosis not present

## 2022-12-25 DIAGNOSIS — N186 End stage renal disease: Secondary | ICD-10-CM | POA: Diagnosis not present

## 2022-12-25 DIAGNOSIS — N2581 Secondary hyperparathyroidism of renal origin: Secondary | ICD-10-CM | POA: Diagnosis not present

## 2022-12-25 DIAGNOSIS — Z992 Dependence on renal dialysis: Secondary | ICD-10-CM | POA: Diagnosis not present

## 2022-12-27 DIAGNOSIS — N186 End stage renal disease: Secondary | ICD-10-CM | POA: Diagnosis not present

## 2022-12-27 DIAGNOSIS — N2581 Secondary hyperparathyroidism of renal origin: Secondary | ICD-10-CM | POA: Diagnosis not present

## 2022-12-27 DIAGNOSIS — Z992 Dependence on renal dialysis: Secondary | ICD-10-CM | POA: Diagnosis not present

## 2022-12-30 DIAGNOSIS — N2581 Secondary hyperparathyroidism of renal origin: Secondary | ICD-10-CM | POA: Diagnosis not present

## 2022-12-30 DIAGNOSIS — Z992 Dependence on renal dialysis: Secondary | ICD-10-CM | POA: Diagnosis not present

## 2022-12-30 DIAGNOSIS — N186 End stage renal disease: Secondary | ICD-10-CM | POA: Diagnosis not present

## 2023-01-01 DIAGNOSIS — N186 End stage renal disease: Secondary | ICD-10-CM | POA: Diagnosis not present

## 2023-01-01 DIAGNOSIS — N2581 Secondary hyperparathyroidism of renal origin: Secondary | ICD-10-CM | POA: Diagnosis not present

## 2023-01-01 DIAGNOSIS — Z992 Dependence on renal dialysis: Secondary | ICD-10-CM | POA: Diagnosis not present

## 2023-01-02 ENCOUNTER — Other Ambulatory Visit: Payer: Self-pay

## 2023-01-02 ENCOUNTER — Other Ambulatory Visit (HOSPITAL_COMMUNITY): Payer: Self-pay

## 2023-01-03 DIAGNOSIS — N186 End stage renal disease: Secondary | ICD-10-CM | POA: Diagnosis not present

## 2023-01-03 DIAGNOSIS — Z992 Dependence on renal dialysis: Secondary | ICD-10-CM | POA: Diagnosis not present

## 2023-01-03 DIAGNOSIS — N2581 Secondary hyperparathyroidism of renal origin: Secondary | ICD-10-CM | POA: Diagnosis not present

## 2023-01-06 DIAGNOSIS — K5793 Diverticulitis of intestine, part unspecified, without perforation or abscess with bleeding: Secondary | ICD-10-CM | POA: Diagnosis not present

## 2023-01-06 DIAGNOSIS — N2581 Secondary hyperparathyroidism of renal origin: Secondary | ICD-10-CM | POA: Diagnosis not present

## 2023-01-06 DIAGNOSIS — Z992 Dependence on renal dialysis: Secondary | ICD-10-CM | POA: Diagnosis not present

## 2023-01-06 DIAGNOSIS — N186 End stage renal disease: Secondary | ICD-10-CM | POA: Diagnosis not present

## 2023-01-08 DIAGNOSIS — Z992 Dependence on renal dialysis: Secondary | ICD-10-CM | POA: Diagnosis not present

## 2023-01-08 DIAGNOSIS — K5793 Diverticulitis of intestine, part unspecified, without perforation or abscess with bleeding: Secondary | ICD-10-CM | POA: Diagnosis not present

## 2023-01-08 DIAGNOSIS — N186 End stage renal disease: Secondary | ICD-10-CM | POA: Diagnosis not present

## 2023-01-08 DIAGNOSIS — N2581 Secondary hyperparathyroidism of renal origin: Secondary | ICD-10-CM | POA: Diagnosis not present

## 2023-01-10 DIAGNOSIS — K5793 Diverticulitis of intestine, part unspecified, without perforation or abscess with bleeding: Secondary | ICD-10-CM | POA: Diagnosis not present

## 2023-01-10 DIAGNOSIS — N186 End stage renal disease: Secondary | ICD-10-CM | POA: Diagnosis not present

## 2023-01-10 DIAGNOSIS — Z992 Dependence on renal dialysis: Secondary | ICD-10-CM | POA: Diagnosis not present

## 2023-01-10 DIAGNOSIS — N2581 Secondary hyperparathyroidism of renal origin: Secondary | ICD-10-CM | POA: Diagnosis not present

## 2023-01-13 DIAGNOSIS — N186 End stage renal disease: Secondary | ICD-10-CM | POA: Diagnosis not present

## 2023-01-13 DIAGNOSIS — N2581 Secondary hyperparathyroidism of renal origin: Secondary | ICD-10-CM | POA: Diagnosis not present

## 2023-01-13 DIAGNOSIS — K5793 Diverticulitis of intestine, part unspecified, without perforation or abscess with bleeding: Secondary | ICD-10-CM | POA: Diagnosis not present

## 2023-01-13 DIAGNOSIS — Z992 Dependence on renal dialysis: Secondary | ICD-10-CM | POA: Diagnosis not present

## 2023-01-14 ENCOUNTER — Encounter: Payer: Medicare Other | Admitting: Internal Medicine

## 2023-01-15 DIAGNOSIS — N186 End stage renal disease: Secondary | ICD-10-CM | POA: Diagnosis not present

## 2023-01-15 DIAGNOSIS — K5793 Diverticulitis of intestine, part unspecified, without perforation or abscess with bleeding: Secondary | ICD-10-CM | POA: Diagnosis not present

## 2023-01-15 DIAGNOSIS — Z992 Dependence on renal dialysis: Secondary | ICD-10-CM | POA: Diagnosis not present

## 2023-01-15 DIAGNOSIS — N2581 Secondary hyperparathyroidism of renal origin: Secondary | ICD-10-CM | POA: Diagnosis not present

## 2023-01-17 DIAGNOSIS — K5793 Diverticulitis of intestine, part unspecified, without perforation or abscess with bleeding: Secondary | ICD-10-CM | POA: Diagnosis not present

## 2023-01-17 DIAGNOSIS — N186 End stage renal disease: Secondary | ICD-10-CM | POA: Diagnosis not present

## 2023-01-17 DIAGNOSIS — N2581 Secondary hyperparathyroidism of renal origin: Secondary | ICD-10-CM | POA: Diagnosis not present

## 2023-01-17 DIAGNOSIS — Z992 Dependence on renal dialysis: Secondary | ICD-10-CM | POA: Diagnosis not present

## 2023-01-20 DIAGNOSIS — Z992 Dependence on renal dialysis: Secondary | ICD-10-CM | POA: Diagnosis not present

## 2023-01-20 DIAGNOSIS — K5793 Diverticulitis of intestine, part unspecified, without perforation or abscess with bleeding: Secondary | ICD-10-CM | POA: Diagnosis not present

## 2023-01-20 DIAGNOSIS — N186 End stage renal disease: Secondary | ICD-10-CM | POA: Diagnosis not present

## 2023-01-20 DIAGNOSIS — N2581 Secondary hyperparathyroidism of renal origin: Secondary | ICD-10-CM | POA: Diagnosis not present

## 2023-01-23 ENCOUNTER — Inpatient Hospital Stay (HOSPITAL_COMMUNITY)
Admission: EM | Admit: 2023-01-23 | Discharge: 2023-01-28 | DRG: 917 | Disposition: A | Discharge status: Home / Self Care | Payer: Medicare HMO | Attending: Internal Medicine | Admitting: Internal Medicine

## 2023-01-23 ENCOUNTER — Emergency Department (HOSPITAL_COMMUNITY): Payer: Medicare HMO

## 2023-01-23 ENCOUNTER — Encounter (HOSPITAL_COMMUNITY): Payer: Self-pay

## 2023-01-23 ENCOUNTER — Other Ambulatory Visit: Payer: Self-pay

## 2023-01-23 DIAGNOSIS — Z992 Dependence on renal dialysis: Secondary | ICD-10-CM | POA: Diagnosis not present

## 2023-01-23 DIAGNOSIS — R9431 Abnormal electrocardiogram [ECG] [EKG]: Secondary | ICD-10-CM | POA: Diagnosis not present

## 2023-01-23 DIAGNOSIS — Z8249 Family history of ischemic heart disease and other diseases of the circulatory system: Secondary | ICD-10-CM

## 2023-01-23 DIAGNOSIS — F209 Schizophrenia, unspecified: Secondary | ICD-10-CM | POA: Diagnosis present

## 2023-01-23 DIAGNOSIS — Z880 Allergy status to penicillin: Secondary | ICD-10-CM

## 2023-01-23 DIAGNOSIS — I120 Hypertensive chronic kidney disease with stage 5 chronic kidney disease or end stage renal disease: Secondary | ICD-10-CM | POA: Diagnosis not present

## 2023-01-23 DIAGNOSIS — I5023 Acute on chronic systolic (congestive) heart failure: Secondary | ICD-10-CM | POA: Diagnosis not present

## 2023-01-23 DIAGNOSIS — F250 Schizoaffective disorder, bipolar type: Secondary | ICD-10-CM | POA: Diagnosis present

## 2023-01-23 DIAGNOSIS — K5721 Diverticulitis of large intestine with perforation and abscess with bleeding: Secondary | ICD-10-CM | POA: Diagnosis present

## 2023-01-23 DIAGNOSIS — I132 Hypertensive heart and chronic kidney disease with heart failure and with stage 5 chronic kidney disease, or end stage renal disease: Secondary | ICD-10-CM | POA: Diagnosis present

## 2023-01-23 DIAGNOSIS — I10 Essential (primary) hypertension: Secondary | ICD-10-CM | POA: Diagnosis not present

## 2023-01-23 DIAGNOSIS — E875 Hyperkalemia: Secondary | ICD-10-CM | POA: Diagnosis present

## 2023-01-23 DIAGNOSIS — T5491XA Toxic effect of unspecified corrosive substance, accidental (unintentional), initial encounter: Secondary | ICD-10-CM | POA: Diagnosis not present

## 2023-01-23 DIAGNOSIS — R1084 Generalized abdominal pain: Secondary | ICD-10-CM | POA: Diagnosis present

## 2023-01-23 DIAGNOSIS — T490X2A Poisoning by local antifungal, anti-infective and anti-inflammatory drugs, intentional self-harm, initial encounter: Secondary | ICD-10-CM | POA: Diagnosis not present

## 2023-01-23 DIAGNOSIS — T490X5A Adverse effect of local antifungal, anti-infective and anti-inflammatory drugs, initial encounter: Secondary | ICD-10-CM

## 2023-01-23 DIAGNOSIS — K5900 Constipation, unspecified: Secondary | ICD-10-CM | POA: Diagnosis not present

## 2023-01-23 DIAGNOSIS — E782 Mixed hyperlipidemia: Secondary | ICD-10-CM | POA: Diagnosis present

## 2023-01-23 DIAGNOSIS — K5780 Diverticulitis of intestine, part unspecified, with perforation and abscess without bleeding: Secondary | ICD-10-CM | POA: Diagnosis present

## 2023-01-23 DIAGNOSIS — T543X1A Toxic effect of corrosive alkalis and alkali-like substances, accidental (unintentional), initial encounter: Principal | ICD-10-CM | POA: Diagnosis present

## 2023-01-23 DIAGNOSIS — K5720 Diverticulitis of large intestine with perforation and abscess without bleeding: Secondary | ICD-10-CM | POA: Diagnosis not present

## 2023-01-23 DIAGNOSIS — I5042 Chronic combined systolic (congestive) and diastolic (congestive) heart failure: Secondary | ICD-10-CM | POA: Diagnosis present

## 2023-01-23 DIAGNOSIS — Z87891 Personal history of nicotine dependence: Secondary | ICD-10-CM | POA: Diagnosis not present

## 2023-01-23 DIAGNOSIS — R000 Tachycardia, unspecified: Secondary | ICD-10-CM | POA: Diagnosis present

## 2023-01-23 DIAGNOSIS — N250 Renal osteodystrophy: Secondary | ICD-10-CM | POA: Diagnosis not present

## 2023-01-23 DIAGNOSIS — D631 Anemia in chronic kidney disease: Secondary | ICD-10-CM | POA: Diagnosis present

## 2023-01-23 DIAGNOSIS — T65891A Toxic effect of other specified substances, accidental (unintentional), initial encounter: Secondary | ICD-10-CM | POA: Diagnosis not present

## 2023-01-23 DIAGNOSIS — Z96643 Presence of artificial hip joint, bilateral: Secondary | ICD-10-CM | POA: Diagnosis present

## 2023-01-23 DIAGNOSIS — E872 Acidosis, unspecified: Secondary | ICD-10-CM | POA: Diagnosis present

## 2023-01-23 DIAGNOSIS — N186 End stage renal disease: Secondary | ICD-10-CM | POA: Diagnosis present

## 2023-01-23 DIAGNOSIS — E8889 Other specified metabolic disorders: Secondary | ICD-10-CM | POA: Diagnosis present

## 2023-01-23 DIAGNOSIS — R0989 Other specified symptoms and signs involving the circulatory and respiratory systems: Secondary | ICD-10-CM | POA: Diagnosis not present

## 2023-01-23 DIAGNOSIS — F319 Bipolar disorder, unspecified: Secondary | ICD-10-CM | POA: Diagnosis present

## 2023-01-23 DIAGNOSIS — R109 Unspecified abdominal pain: Secondary | ICD-10-CM | POA: Diagnosis not present

## 2023-01-23 DIAGNOSIS — H409 Unspecified glaucoma: Secondary | ICD-10-CM | POA: Diagnosis present

## 2023-01-23 DIAGNOSIS — Z79899 Other long term (current) drug therapy: Secondary | ICD-10-CM | POA: Diagnosis not present

## 2023-01-23 DIAGNOSIS — K8020 Calculus of gallbladder without cholecystitis without obstruction: Secondary | ICD-10-CM | POA: Diagnosis not present

## 2023-01-23 DIAGNOSIS — K5732 Diverticulitis of large intestine without perforation or abscess without bleeding: Secondary | ICD-10-CM | POA: Diagnosis not present

## 2023-01-23 DIAGNOSIS — I517 Cardiomegaly: Secondary | ICD-10-CM | POA: Diagnosis not present

## 2023-01-23 DIAGNOSIS — R410 Disorientation, unspecified: Secondary | ICD-10-CM | POA: Diagnosis not present

## 2023-01-23 DIAGNOSIS — R1011 Right upper quadrant pain: Secondary | ICD-10-CM | POA: Diagnosis not present

## 2023-01-23 DIAGNOSIS — R197 Diarrhea, unspecified: Secondary | ICD-10-CM | POA: Diagnosis not present

## 2023-01-23 LAB — I-STAT VENOUS BLOOD GAS, ED
Acid-base deficit: 8 mmol/L — ABNORMAL HIGH (ref 0.0–2.0)
Bicarbonate: 17.8 mmol/L — ABNORMAL LOW (ref 20.0–28.0)
Calcium, Ion: 0.99 mmol/L — ABNORMAL LOW (ref 1.15–1.40)
HCT: 33 % — ABNORMAL LOW (ref 39.0–52.0)
Hemoglobin: 11.2 g/dL — ABNORMAL LOW (ref 13.0–17.0)
O2 Saturation: 78 %
Potassium: 5.7 mmol/L — ABNORMAL HIGH (ref 3.5–5.1)
Sodium: 135 mmol/L (ref 135–145)
TCO2: 19 mmol/L — ABNORMAL LOW (ref 22–32)
pCO2, Ven: 37.9 mmHg — ABNORMAL LOW (ref 44–60)
pH, Ven: 7.279 (ref 7.25–7.43)
pO2, Ven: 47 mmHg — ABNORMAL HIGH (ref 32–45)

## 2023-01-23 LAB — I-STAT CHEM 8, ED
BUN: 101 mg/dL — ABNORMAL HIGH (ref 6–20)
Calcium, Ion: 0.99 mmol/L — ABNORMAL LOW (ref 1.15–1.40)
Chloride: 104 mmol/L (ref 98–111)
Creatinine, Ser: 12.7 mg/dL — ABNORMAL HIGH (ref 0.61–1.24)
Glucose, Bld: 100 mg/dL — ABNORMAL HIGH (ref 70–99)
HCT: 34 % — ABNORMAL LOW (ref 39.0–52.0)
Hemoglobin: 11.6 g/dL — ABNORMAL LOW (ref 13.0–17.0)
Potassium: 5.7 mmol/L — ABNORMAL HIGH (ref 3.5–5.1)
Sodium: 135 mmol/L (ref 135–145)
TCO2: 18 mmol/L — ABNORMAL LOW (ref 22–32)

## 2023-01-23 NOTE — ED Provider Notes (Signed)
[place] [facility] AT [facility] Provider Note   CSN: [id number] Arrival date & time: 01/23/23  2222     History  Chief Complaint  Patient presents with   Abdominal Pain   Hypertension    Paul [name] is a 57 y.o. male  With history of hypertension, schizophrenia, ESRD on HD Monday/Wednesday/Friday, missed session yesterday who presents with concern for abdominal pain following ingestion of hydroperoxide.  States that he has been wanting to "really clean out my colon", stating that he knows that the "toxins that have been living there have been poisoning me".  States he may drink 4 ounces of hydrogen peroxide at a time, unknown number of ingestions in the last 24 hours.  States that his abdominal pain worsened today after he used hydrogen peroxide and an enema at home.  Denies nausea vomiting or diarrhea at this time, states he did not go to dialysis yesterday because he did not want to go.  Patient appears somewhat confused.  Level 5 caveat due to alteration of mental status.  HPI     Home Medications Prior to Admission medications   Medication Sig Start Date End Date Taking? Authorizing Provider  calcium acetate (PHOSLO) 667 MG capsule Take 2 capsules (1,334 mg total) by mouth 3 (three) times daily with meals. Patient not taking: Reported on 09/02/2022 06/15/22   Estanislado Emms, MD  carvedilol (COREG) 6.25 MG tablet Take 1 tablet (6.25 mg total) by mouth 2 (two) times daily with a meal. 06/15/22 09/15/22  Estanislado Emms, MD  carvedilol (COREG) 6.25 MG tablet Take 1 tablet (6.25 mg total) by mouth 2 (two) times daily with a meal. 09/17/22     isosorbide mononitrate (IMDUR) 60 MG 24 hr tablet Take 1 tablet (60 mg total) by mouth daily. 06/15/22 09/15/22  Estanislado Emms, MD  isosorbide mononitrate (IMDUR) 60 MG 24 hr tablet Take 1 tablet (60 mg total) by mouth daily. 09/17/22     isosorbide mononitrate (IMDUR) 60 MG 24 hr tablet Take 1 tablet (60 mg total) by  mouth daily. 12/18/22     lidocaine-prilocaine (EMLA) cream Apply 1 a small amount to skin three times a week apply to AVF 30-60 min prior to dialysis, wrap site w plastic wrap 09/12/22     rosuvastatin (CRESTOR) 5 MG tablet Take 1 tablet (5 mg total) by mouth daily. 06/15/22 12/21/22  Estanislado Emms, MD  rosuvastatin (CRESTOR) 5 MG tablet Take 1 tablet by mouth once a day 11/21/22     sucroferric oxyhydroxide (VELPHORO) 500 MG chewable tablet Chew 2 tablets (1,000 mg total) by mouth with breakfast, with lunch, and with evening meal. 08/08/22         Allergies    Penicillins    Review of Systems   Review of Systems  Unable to perform ROS: Mental status change  HENT: Negative.    Gastrointestinal:  Positive for abdominal pain and constipation. Negative for diarrhea, nausea and vomiting.    Physical Exam Updated Vital Signs BP (!) 193/95   Pulse 83   Temp 97.7 F (36.5 C) (Oral)   Resp 15   SpO2 100%  Physical Exam Vitals and nursing note reviewed.  Constitutional:      Appearance: He is not toxic-appearing.  HENT:     Head: Normocephalic and atraumatic.     Mouth/Throat:     Mouth: Mucous membranes are moist.     Pharynx: Oropharynx is clear. Uvula midline. No oropharyngeal exudate  or posterior oropharyngeal erythema.     Comments: No evidence of caustic injury on visualization of the oropharynx Eyes:     General:        Right eye: No discharge.        Left eye: No discharge.     Conjunctiva/sclera: Conjunctivae normal.  Cardiovascular:     Rate and Rhythm: Normal rate and regular rhythm.     Pulses: Normal pulses.     Heart sounds: Murmur heard.  Pulmonary:     Effort: Pulmonary effort is normal. No respiratory distress.     Breath sounds: Normal breath sounds. No wheezing or rales.  Abdominal:     General: Bowel sounds are normal. There is no distension.     Palpations: Abdomen is soft.     Tenderness: There is abdominal tenderness in the right upper quadrant and epigastric  area. There is no right CVA tenderness, left CVA tenderness, guarding or rebound.  Musculoskeletal:        General: No deformity.     Cervical back: Neck supple.  Skin:    General: Skin is warm and dry.     Capillary Refill: Capillary refill takes less than 2 seconds.  Neurological:     Mental Status: He is alert. Mental status is at baseline. He is disoriented.  Psychiatric:        Mood and Affect: Affect is flat.     ED Results / Procedures / Treatments   Labs (all labs ordered are listed, but only abnormal results are displayed) Labs Reviewed  CBC WITH DIFFERENTIAL/PLATELET - Abnormal; Notable for the following components:      Result Value   WBC 14.8 (*)    RBC 3.43 (*)    Hemoglobin 10.5 (*)    HCT 30.9 (*)    Neutro Abs 13.2 (*)    Abs Immature Granulocytes 0.26 (*)    All other components within normal limits  COMPREHENSIVE METABOLIC PANEL - Abnormal; Notable for the following components:   Potassium 5.8 (*)    CO2 17 (*)    Glucose, Bld 108 (*)    BUN 90 (*)    Creatinine, Ser 11.65 (*)    Calcium 8.3 (*)    AST 13 (*)    GFR, Estimated 5 (*)    Anion gap 22 (*)    All other components within normal limits  SALICYLATE LEVEL - Abnormal; Notable for the following components:   Salicylate Lvl <7.0 (*)    All other components within normal limits  I-STAT VENOUS BLOOD GAS, ED - Abnormal; Notable for the following components:   pCO2, Ven 37.9 (*)    pO2, Ven 47 (*)    Bicarbonate 17.8 (*)    TCO2 19 (*)    Acid-base deficit 8.0 (*)    Potassium 5.7 (*)    Calcium, Ion 0.99 (*)    HCT 33.0 (*)    Hemoglobin 11.2 (*)    All other components within normal limits  I-STAT CHEM 8, ED - Abnormal; Notable for the following components:   Potassium 5.7 (*)    BUN 101 (*)    Creatinine, Ser 12.70 (*)    Glucose, Bld 100 (*)    Calcium, Ion 0.99 (*)    TCO2 18 (*)    Hemoglobin 11.6 (*)    HCT 34.0 (*)    All other components within normal limits  ETHANOL   PROTIME-INR  LACTIC ACID, PLASMA  RAPID URINE DRUG SCREEN, HOSP PERFORMED  LACTIC ACID, PLASMA    EKG None  Radiology CT ABDOMEN PELVIS W CONTRAST  Addendum Date: 01/24/2023   ADDENDUM REPORT: 01/24/2023 01:18 ADDENDUM: Given distribution of the previously mentioned "free intraperitoneal gas" along the sigmoid colon and its mesentery, mesenteric venous gas is more likely in the setting of given history: hydrogen peroxide ingestion PO and rectal. Perforated acute diverticulitis is not excluded but less likely (7:65.) Concern for sigmoiditis and proctitis with mesenteric venous gas related to hydrogen peroxide ingestion/rectal administration. No pneumatosis identified. No free intraperitoneal fluid. No portal venous gas identified. Recommend colonoscopy status post treatment and status post complete resolution of inflammatory changes to exclude an underlying lesion. These results were called by telephone at the time of interpretation on 01/24/2023 at 1:03 am to provider Dr. Pilar Plate, who verbally acknowledged these results. Electronically Signed   By: Tish Frederickson M.D.   On: 01/24/2023 01:18   Result Date: 01/24/2023 CLINICAL DATA:  RUQ PAIN, HYDROGEN PEROXIDE INGESTION EXAM: CT ABDOMEN AND PELVIS WITH CONTRAST TECHNIQUE: Multidetector CT imaging of the abdomen and pelvis was performed using the standard protocol following bolus administration of intravenous contrast. RADIATION DOSE REDUCTION: This exam was performed according to the departmental dose-optimization program which includes automated exposure control, adjustment of the mA and/or kV according to patient size and/or use of iterative reconstruction technique. CONTRAST:  60mL OMNIPAQUE IOHEXOL 350 MG/ML SOLN COMPARISON:  None Available. FINDINGS: Lower chest: Prominent heart size.  No acute abnormality. Hepatobiliary: No focal liver abnormality. Calcified gallstone noted within the gallbladder lumen. No gallbladder wall thickening or  pericholecystic fluid. No biliary dilatation. Pancreas: No focal lesion. Normal pancreatic contour. No surrounding inflammatory changes. No main pancreatic ductal dilatation. Spleen: Normal in size without focal abnormality. Adrenals/Urinary Tract: No adrenal nodule bilaterally. Bilateral atrophic kidneys. Bilateral kidneys enhance symmetrically. No hydronephrosis. No hydroureter. Limited evaluation of the urinary bladder lumen due to streak artifact originating from bilateral femoral surgical hardware. The urinary bladder is unremarkable. Stomach/Bowel: Stomach is within normal limits. No evidence of small bowel wall thickening or dilatation. Mid to distal sigmoid bowel wall thickening with associated mild pericolonic fat stranding and free foci of gas scattered along the sigmoid colon, pelvis and mid abdomen. No intramural abscess formation. Circumferential bowel thickening of the rectum. Limited evaluation of the rectum due to streak artifact originating from bilateral femoral surgical hardware.Appendix appears normal. Vascular/Lymphatic: No abdominal aorta or iliac aneurysm. Mild atherosclerotic plaque of the aorta and its branches. No abdominal, pelvic, or inguinal lymphadenopathy. Reproductive: Limited evaluation due to streak artifact originating from bilateral femoral surgical hardware. Other: No intraperitoneal free fluid. Abdomen and pelvis positive for free intraperitoneal gas grossly scattered throughout the mesentery of the sigmoid colon but also noted within the mid abdomen and pelvis. No organized fluid collection. Musculoskeletal: No abdominal wall hernia or abnormality. No suspicious lytic or blastic osseous lesions. No acute displaced fracture. Multilevel severe degenerative changes of the spine. Bilateral total hip arthroplasty. IMPRESSION: 1. Colonic diverticulosis with acute perforated mid to distal sigmoid colon diverticulitis. No organized fluid collection. Recommend colonoscopy status post  treatment and status post complete resolution of inflammatory changes to exclude an underlying lesion. 2. Circumferential bowel wall thickening of the rectum which may represent reactive changes versus proctitis. Limited evaluation of the rectum due to streak artifact originating from bilateral femoral surgical hardware. 3. Cholelithiasis with no CT finding of acute cholecystitis. 4. Atrophic kidneys. 5.  Aortic Atherosclerosis (ICD10-I70.0). Electronically Signed: By: Tish Frederickson M.D. On: 01/24/2023 00:59   CT  HEAD WO CONTRAST ( )  Result Date: 01/24/2023 CLINICAL DATA:  Delirium EXAM: CT HEAD WITHOUT CONTRAST TECHNIQUE: Contiguous axial images were obtained from the base of the skull through the vertex without intravenous contrast. RADIATION DOSE REDUCTION: This exam was performed according to the departmental dose-optimization program which includes automated exposure control, adjustment of the mA and/or kV according to patient size and/or use of iterative reconstruction technique. COMPARISON:  CT head 04/20/2013, MRI head 09/11/2011 FINDINGS: Brain: Patchy and confluent areas of decreased attenuation are noted throughout the deep and periventricular white matter of the cerebral hemispheres bilaterally, compatible with chronic microvascular ischemic disease. No evidence of large-territorial acute infarction. No parenchymal hemorrhage. No mass lesion. No extra-axial collection. No mass effect or midline shift. No hydrocephalus. Basilar cisterns are patent. Vascular: No hyperdense vessel. Skull: No acute fracture or focal lesion. Sinuses/Orbits: Bilateral maxillary sinus mucosal thickening. Otherwise paranasal sinuses and mastoid air cells are clear. 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 The right orbit is unremarkable. Other: None. IMPRESSION: No acute intracranial abnormality. Electronically Signed   By: Tish Frederickson M.D.   On: 01/24/2023 01:01   DG Chest Portable 1 View  Result Date: 01/23/2023 CLINICAL  DATA:  Ingestion. EXAM: PORTABLE CHEST 1 VIEW COMPARISON:  Chest x-ray 05/11/2022 FINDINGS: Heart is enlarged, unchanged. There central pulmonary vascular congestion. There is no radiopaque foreign body. The lungs and costophrenic angles are clear. There is no pneumothorax or acute fracture. IMPRESSION: 1. Cardiomegaly with central pulmonary vascular congestion. 2. No radiopaque foreign body. Electronically Signed   By: Darliss Cheney M.D.   On: 01/23/2023 23:45    Procedures .Critical Care  Performed by: Paris Lore, PA-C Authorized by: Paris Lore, PA-C   Critical care provider statement:    Critical care time (minutes):  60   Critical care was time spent personally by me on the following activities:  Development of treatment plan with patient or surrogate, discussions with consultants, evaluation of patient's response to treatment, examination of patient, obtaining history from patient or surrogate, ordering and performing treatments and interventions, ordering and review of laboratory studies, ordering and review of radiographic studies, pulse oximetry and re-evaluation of patient's condition     Medications Ordered in ED Medications  piperacillin-tazobactam (ZOSYN) IVPB 3.375 g (3.375 g Intravenous New Bag/Given 01/24/23 0156)  iohexol (OMNIPAQUE) 350 MG/ML injection 60 mL (60 mLs Intravenous Contrast Given 01/24/23 0030)  sodium bicarbonate injection 50 mEq (50 mEq Intravenous Given 01/24/23 0200)    ED Course/ Medical Decision Making/ A&P Clinical Course as of 01/24/23 6213  Thu Jan 23, 2023  2314 Consult poison control RN Tacey Ruiz, who recommends both CT head and abdomen/pelvis.,  Close monitoring.  If caustic injury to the pharynx, GI consult. I appreciate her collaboration in the care of this patient.  [RS]  Fri Jan 24, 2023  0107 EDP Dr. Pilar Plate discussed case with radiologist who identified venous air on CT as well as perforated diverticulitis. Poison control RN Tacey Ruiz   [RS]  0865 Per poison control toxicologist Dr. Ethelene Browns Degelorm, perforated diverticulitis takes precedence over hyperbarics for question of venous air in this scenario. I appreciate their collaboration in the care of this patient. [RS]  0124 Central RN Leah contact this provider again with further recommendations from toxicologist listed above.  The recommendations are that should general surgery proceed with surgical intervention for suspected diverticulitis that Poison control should be consulted for further recommendations. I appreciate their collaboration the care of this patient. [RS]  0141 Consult to  Dr. Sheliah Hatch, surgery, who is agreeable to consult on this patient. I appreciate his collaboration in the care of this patient.  [RS]  0151 Consult Dr. Malen Gauze, nephrology, who recommends amp of bicarb and he will be dialyzed in the AM. I appreciate her collaboration in the care of this patient.  [RS]  0201 Consult to Dr. Joneen Roach, hospitalist, who is agreeable to admitting this patient to her service. I appreciate her collaboration in the care of this patient.  [RS]    Clinical Course User Index [RS] Armin Yerger, Eugene Gavia, PA-C                                 Medical Decision Making 57 year old male with ingestion of hydrogen peroxide, now with abdominal pain.  Hypertensive on intake, vital signs otherwise normal.  Cardiopulmonary exam is unremarkable, abdominal exam with epigastric and right upper quadrant tenderness palpation without guarding or rebound.  Patient altered, confused, slowed cognition.  Amount and/or Complexity of Data Reviewed Labs: ordered.    Details: CBC with leukocytosis of 14.8, mild anemia with hemoglobin of 10.5.  CMP with elevated potassium 5.8, creatinine 11.6 at patient's baseline.  Anion gap elevated to 22.  Lactic acid is normal at 1.  Salicylate and alcohol levels are normal, INR is normal.  VBG without acidosis. Radiology: ordered.  Risk Prescription drug  management. Decision regarding hospitalization.   Please see extensive ED course documentation for multiple consultation calls by this provider and EDP Dr. Pilar Plate patient will require medical admission for management of suspected perforated diverticulitis and venous air likely secondary to hydroperoxide ingestion.  Remains hemodynamic stable resting comfortably in his bed at this time.  Consults to general surgery, nephrology and hospital medicine as above.  Danyel  voiced understanding of his medical evaluation and treatment plan. Each of their questions answered to their expressed satisfaction.  Return precautions were given.  Patient is well-appearing, stable, and was discharged in good condition.  This chart was dictated using voice recognition software, Dragon. Despite the best efforts of this provider to proofread and correct errors, errors may still occur which can change documentation meaning.          Final Clinical Impression(s) / ED Diagnoses Final diagnoses:  Diverticulitis of large intestine with perforation without abscess, unspecified bleeding status  Adverse effect of hydrogen peroxide    Rx / DC Orders ED Discharge Orders     None         Sherrilee Gilles 01/24/23 0213    Sabas Sous, MD 01/24/23 [phone]

## 2023-01-23 NOTE — ED Triage Notes (Signed)
Patient comes from home. He has missed dialysis because he does not want to go.  He is hypertensive with EMS at 230 systolic.   He has been drinking hydrogen peroxide and gave himself an enema with it because he feels "compacted". He has been having loose stools since.  He is requesting a GI consult.

## 2023-01-24 ENCOUNTER — Emergency Department (HOSPITAL_COMMUNITY): Payer: Medicare HMO

## 2023-01-24 DIAGNOSIS — F209 Schizophrenia, unspecified: Secondary | ICD-10-CM | POA: Diagnosis not present

## 2023-01-24 DIAGNOSIS — R1084 Generalized abdominal pain: Secondary | ICD-10-CM | POA: Diagnosis not present

## 2023-01-24 DIAGNOSIS — T5491XA Toxic effect of unspecified corrosive substance, accidental (unintentional), initial encounter: Secondary | ICD-10-CM

## 2023-01-24 DIAGNOSIS — T490X5A Adverse effect of local antifungal, anti-infective and anti-inflammatory drugs, initial encounter: Secondary | ICD-10-CM | POA: Diagnosis not present

## 2023-01-24 DIAGNOSIS — I5042 Chronic combined systolic (congestive) and diastolic (congestive) heart failure: Secondary | ICD-10-CM | POA: Diagnosis not present

## 2023-01-24 DIAGNOSIS — K5780 Diverticulitis of intestine, part unspecified, with perforation and abscess without bleeding: Secondary | ICD-10-CM | POA: Diagnosis present

## 2023-01-24 DIAGNOSIS — T490X2A Poisoning by local antifungal, anti-infective and anti-inflammatory drugs, intentional self-harm, initial encounter: Secondary | ICD-10-CM | POA: Diagnosis not present

## 2023-01-24 DIAGNOSIS — N186 End stage renal disease: Secondary | ICD-10-CM | POA: Diagnosis not present

## 2023-01-24 DIAGNOSIS — K5720 Diverticulitis of large intestine with perforation and abscess without bleeding: Secondary | ICD-10-CM

## 2023-01-24 LAB — CBC WITH DIFFERENTIAL/PLATELET
Abs Immature Granulocytes: 0.11 10*3/uL — ABNORMAL HIGH (ref 0.00–0.07)
Abs Immature Granulocytes: 0.26 10*3/uL — ABNORMAL HIGH (ref 0.00–0.07)
Basophils Absolute: 0 10*3/uL (ref 0.0–0.1)
Basophils Absolute: 0 10*3/uL (ref 0.0–0.1)
Basophils Relative: 0 %
Basophils Relative: 0 %
Eosinophils Absolute: 0 10*3/uL (ref 0.0–0.5)
Eosinophils Absolute: 0 10*3/uL (ref 0.0–0.5)
Eosinophils Relative: 0 %
Eosinophils Relative: 0 %
HCT: 30.9 % — ABNORMAL LOW (ref 39.0–52.0)
HCT: 31 % — ABNORMAL LOW (ref 39.0–52.0)
Hemoglobin: 10.5 g/dL — ABNORMAL LOW (ref 13.0–17.0)
Hemoglobin: 11.1 g/dL — ABNORMAL LOW (ref 13.0–17.0)
Immature Granulocytes: 1 %
Immature Granulocytes: 2 %
Lymphocytes Relative: 4 %
Lymphocytes Relative: 5 %
Lymphs Abs: 0.7 10*3/uL (ref 0.7–4.0)
Lymphs Abs: 0.7 10*3/uL (ref 0.7–4.0)
MCH: 30.6 pg (ref 26.0–34.0)
MCH: 32 pg (ref 26.0–34.0)
MCHC: 34 g/dL (ref 30.0–36.0)
MCHC: 35.8 g/dL (ref 30.0–36.0)
MCV: 89.3 fL (ref 80.0–100.0)
MCV: 90.1 fL (ref 80.0–100.0)
Monocytes Absolute: 0.6 10*3/uL (ref 0.1–1.0)
Monocytes Absolute: 0.6 10*3/uL (ref 0.1–1.0)
Monocytes Relative: 4 %
Monocytes Relative: 4 %
Neutro Abs: 13.2 10*3/uL — ABNORMAL HIGH (ref 1.7–7.7)
Neutro Abs: 15.2 10*3/uL — ABNORMAL HIGH (ref 1.7–7.7)
Neutrophils Relative %: 89 %
Neutrophils Relative %: 91 %
Platelets: 205 10*3/uL (ref 150–400)
Platelets: 247 10*3/uL (ref 150–400)
RBC: 3.43 MIL/uL — ABNORMAL LOW (ref 4.22–5.81)
RBC: 3.47 MIL/uL — ABNORMAL LOW (ref 4.22–5.81)
RDW: 14.8 % (ref 11.5–15.5)
RDW: 14.9 % (ref 11.5–15.5)
WBC: 14.8 10*3/uL — ABNORMAL HIGH (ref 4.0–10.5)
WBC: 16.6 10*3/uL — ABNORMAL HIGH (ref 4.0–10.5)
nRBC: 0 % (ref 0.0–0.2)
nRBC: 0 % (ref 0.0–0.2)

## 2023-01-24 LAB — COMPREHENSIVE METABOLIC PANEL
ALT: 16 U/L (ref 0–44)
ALT: 19 U/L (ref 0–44)
AST: 12 U/L — ABNORMAL LOW (ref 15–41)
AST: 13 U/L — ABNORMAL LOW (ref 15–41)
Albumin: 3.8 g/dL (ref 3.5–5.0)
Albumin: 4.3 g/dL (ref 3.5–5.0)
Alkaline Phosphatase: 48 U/L (ref 38–126)
Alkaline Phosphatase: 54 U/L (ref 38–126)
Anion gap: 22 — ABNORMAL HIGH (ref 5–15)
Anion gap: 22 — ABNORMAL HIGH (ref 5–15)
BUN: 90 mg/dL — ABNORMAL HIGH (ref 6–20)
BUN: 96 mg/dL — ABNORMAL HIGH (ref 6–20)
CO2: 16 mmol/L — ABNORMAL LOW (ref 22–32)
CO2: 17 mmol/L — ABNORMAL LOW (ref 22–32)
Calcium: 8 mg/dL — ABNORMAL LOW (ref 8.9–10.3)
Calcium: 8.3 mg/dL — ABNORMAL LOW (ref 8.9–10.3)
Chloride: 98 mmol/L (ref 98–111)
Chloride: 98 mmol/L (ref 98–111)
Creatinine, Ser: 11.65 mg/dL — ABNORMAL HIGH (ref 0.61–1.24)
Creatinine, Ser: 12.18 mg/dL — ABNORMAL HIGH (ref 0.61–1.24)
GFR, Estimated: 4 mL/min — ABNORMAL LOW (ref >60)
GFR, Estimated: 5 mL/min — ABNORMAL LOW (ref >60)
Glucose, Bld: 108 mg/dL — ABNORMAL HIGH (ref 70–99)
Glucose, Bld: 158 mg/dL — ABNORMAL HIGH (ref 70–99)
Potassium: 5.8 mmol/L — ABNORMAL HIGH (ref 3.5–5.1)
Potassium: 6.6 mmol/L (ref 3.5–5.1)
Sodium: 136 mmol/L (ref 135–145)
Sodium: 137 mmol/L (ref 135–145)
Total Bilirubin: 1.2 mg/dL (ref 0.3–1.2)
Total Bilirubin: 1.6 mg/dL — ABNORMAL HIGH (ref 0.3–1.2)
Total Protein: 7.5 g/dL (ref 6.5–8.1)
Total Protein: 8 g/dL (ref 6.5–8.1)

## 2023-01-24 LAB — PROTIME-INR
INR: 1.1 (ref 0.8–1.2)
Prothrombin Time: 13.9 seconds (ref 11.4–15.2)

## 2023-01-24 LAB — POTASSIUM: Potassium: 3 mmol/L — ABNORMAL LOW (ref 3.5–5.1)

## 2023-01-24 LAB — RAPID URINE DRUG SCREEN, HOSP PERFORMED
Amphetamines: NOT DETECTED
Barbiturates: NOT DETECTED
Benzodiazepines: NOT DETECTED
Cocaine: NOT DETECTED
Opiates: NOT DETECTED
Tetrahydrocannabinol: NOT DETECTED

## 2023-01-24 LAB — HEPATITIS B SURFACE ANTIGEN: Hepatitis B Surface Ag: NONREACTIVE

## 2023-01-24 LAB — GLUCOSE, CAPILLARY
Glucose-Capillary: 115 mg/dL — ABNORMAL HIGH (ref 70–99)
Glucose-Capillary: 101 mg/dL — ABNORMAL HIGH (ref 70–99)

## 2023-01-24 LAB — LACTIC ACID, PLASMA
Lactic Acid, Venous: 1 mmol/L (ref 0.5–1.9)
Lactic Acid, Venous: 1.8 mmol/L (ref 0.5–1.9)

## 2023-01-24 LAB — SALICYLATE LEVEL: Salicylate Lvl: <7 mg/dL — ABNORMAL LOW (ref 7.0–30.0)

## 2023-01-24 LAB — ETHANOL: Alcohol, Ethyl (B): <10 mg/dL (ref <10)

## 2023-01-24 MED ORDER — PIPERACILLIN-TAZOBACTAM IN DEX 2-0.25 GM/50ML IV SOLN
2.2500 g | Freq: Three times a day (TID) | INTRAVENOUS | Status: DC
  Administered 2023-01-24 – 2023-01-28 (×12): 2.25 g via INTRAVENOUS
  Filled 2023-01-24 (×17): qty 50

## 2023-01-24 MED ORDER — ALBUTEROL SULFATE (2.5 MG/3ML) 0.083% IN NEBU: 10.0000 mg | INHALATION_SOLUTION | Freq: Once | RESPIRATORY_TRACT | Status: DC

## 2023-01-24 MED ORDER — CHLORHEXIDINE GLUCONATE CLOTH 2 % EX PADS
6.0000 | MEDICATED_PAD | Freq: Every day | CUTANEOUS | Status: DC
  Administered 2023-01-24 – 2023-01-28 (×5): 6 via TOPICAL

## 2023-01-24 MED ORDER — PANTOPRAZOLE SODIUM 40 MG IV SOLR
40.0000 mg | Freq: Two times a day (BID) | INTRAVENOUS | Status: DC
  Administered 2023-01-24 – 2023-01-28 (×9): 40 mg via INTRAVENOUS
  Filled 2023-01-24 (×8): qty 10

## 2023-01-24 MED ORDER — HEPARIN SODIUM (PORCINE) 5000 UNIT/ML IJ SOLN
5000.0000 [IU] | Freq: Three times a day (TID) | INTRAMUSCULAR | Status: DC
  Administered 2023-01-24 – 2023-01-28 (×12): 5000 [IU] via SUBCUTANEOUS
  Filled 2023-01-24 (×12): qty 1

## 2023-01-24 MED ORDER — DEXTROSE 50 % IV SOLN
1.0000 | Freq: Once | INTRAVENOUS | Status: AC
  Administered 2023-01-24: 50 mL via INTRAVENOUS
  Filled 2023-01-24: qty 50

## 2023-01-24 MED ORDER — CALCIUM GLUCONATE-NACL 1-0.675 GM/50ML-% IV SOLN: 1.0000 g | Freq: Once | INTRAVENOUS | Status: DC

## 2023-01-24 MED ORDER — IOHEXOL 350 MG/ML SOLN
60.0000 mL | Freq: Once | INTRAVENOUS | Status: AC | PRN
  Administered 2023-01-24: 60 mL via INTRAVENOUS

## 2023-01-24 MED ORDER — CARVEDILOL 3.125 MG PO TABS: 6.2500 mg | ORAL_TABLET | Freq: Two times a day (BID) | ORAL | Status: DC

## 2023-01-24 MED ORDER — CALCIUM GLUCONATE-NACL 1-0.675 GM/50ML-% IV SOLN
1.0000 g | Freq: Once | INTRAVENOUS | Status: AC
  Administered 2023-01-24: 1000 mg via INTRAVENOUS
  Filled 2023-01-24: qty 50

## 2023-01-24 MED ORDER — ALBUTEROL SULFATE (2.5 MG/3ML) 0.083% IN NEBU
10.0000 mg | INHALATION_SOLUTION | Freq: Once | RESPIRATORY_TRACT | Status: AC
  Administered 2023-01-24: 10 mg via RESPIRATORY_TRACT
  Filled 2023-01-24: qty 12

## 2023-01-24 MED ORDER — ACETAMINOPHEN 650 MG RE SUPP: 650.0000 mg | Freq: Four times a day (QID) | RECTAL | Status: DC | PRN

## 2023-01-24 MED ORDER — ONDANSETRON HCL 4 MG PO TABS: 4.0000 mg | ORAL_TABLET | Freq: Four times a day (QID) | ORAL | Status: DC | PRN

## 2023-01-24 MED ORDER — METOPROLOL TARTRATE 5 MG/5ML IV SOLN: 10.0000 mg | INTRAVENOUS | Status: DC | PRN

## 2023-01-24 MED ORDER — INSULIN ASPART 100 UNIT/ML IV SOLN
5.0000 [IU] | Freq: Once | INTRAVENOUS | Status: AC
  Administered 2023-01-24: 5 [IU] via INTRAVENOUS

## 2023-01-24 MED ORDER — PENTAFLUOROPROP-TETRAFLUOROETH EX AERO: 1.0000 | INHALATION_SPRAY | CUTANEOUS | Status: DC | PRN

## 2023-01-24 MED ORDER — SUCROFERRIC OXYHYDROXIDE 500 MG PO CHEW
1000.0000 mg | CHEWABLE_TABLET | Freq: Three times a day (TID) | ORAL | Status: DC
  Administered 2023-01-25 – 2023-01-28 (×8): 1000 mg via ORAL
  Filled 2023-01-24 (×14): qty 2

## 2023-01-24 MED ORDER — PIPERACILLIN-TAZOBACTAM 3.375 G IVPB 30 MIN
3.3750 g | Freq: Once | INTRAVENOUS | Status: AC
  Administered 2023-01-24: 3.375 g via INTRAVENOUS
  Filled 2023-01-24: qty 50

## 2023-01-24 MED ORDER — ISOSORBIDE MONONITRATE ER 30 MG PO TB24: 60.0000 mg | ORAL_TABLET | Freq: Every day | ORAL | Status: DC

## 2023-01-24 MED ORDER — ANTICOAGULANT SODIUM CITRATE 4% (200MG/5ML) IV SOLN: 5.0000 mL | Status: DC | PRN

## 2023-01-24 MED ORDER — HEPARIN SODIUM (PORCINE) 1000 UNIT/ML DIALYSIS: 1000.0000 [IU] | INTRAMUSCULAR | Status: DC | PRN

## 2023-01-24 MED ORDER — ONDANSETRON HCL 4 MG/2ML IJ SOLN: 4.0000 mg | Freq: Four times a day (QID) | INTRAMUSCULAR | Status: DC | PRN

## 2023-01-24 MED ORDER — NEPRO/CARBSTEADY PO LIQD: 237.0000 mL | ORAL | Status: DC | PRN

## 2023-01-24 MED ORDER — INSULIN ASPART 100 UNIT/ML IV SOLN: 5.0000 [IU] | Freq: Once | INTRAVENOUS | Status: DC

## 2023-01-24 MED ORDER — SODIUM BICARBONATE 8.4 % IV SOLN
50.0000 meq | Freq: Once | INTRAVENOUS | Status: AC
  Administered 2023-01-24: 50 meq via INTRAVENOUS
  Filled 2023-01-24: qty 50

## 2023-01-24 MED ORDER — LIDOCAINE-PRILOCAINE 2.5-2.5 % EX CREA: 1.0000 | TOPICAL_CREAM | CUTANEOUS | Status: DC | PRN

## 2023-01-24 MED ORDER — DEXTROSE 50 % IV SOLN: 1.0000 | Freq: Once | INTRAVENOUS | Status: DC

## 2023-01-24 MED ORDER — CALCITRIOL 0.25 MCG PO CAPS
0.2500 ug | ORAL_CAPSULE | ORAL | Status: DC
  Administered 2023-01-24 – 2023-01-27 (×2): 0.25 ug via ORAL
  Filled 2023-01-24 (×2): qty 1

## 2023-01-24 MED ORDER — HYDRALAZINE HCL 20 MG/ML IJ SOLN
10.0000 mg | Freq: Four times a day (QID) | INTRAMUSCULAR | Status: DC
  Administered 2023-01-24 – 2023-01-28 (×16): 10 mg via INTRAVENOUS
  Filled 2023-01-24 (×16): qty 1

## 2023-01-24 MED ORDER — HEPARIN SODIUM (PORCINE) 1000 UNIT/ML DIALYSIS
2400.0000 [IU] | Freq: Once | INTRAMUSCULAR | Status: AC
  Administered 2023-01-24: 2400 [IU] via INTRAVENOUS_CENTRAL
  Filled 2023-01-24: qty 3

## 2023-01-24 MED ORDER — LIDOCAINE HCL (PF) 1 % IJ SOLN: 5.0000 mL | INTRAMUSCULAR | Status: DC | PRN

## 2023-01-24 MED ORDER — ALTEPLASE 2 MG IJ SOLR: 2.0000 mg | Freq: Once | INTRAMUSCULAR | Status: DC | PRN

## 2023-01-24 MED ORDER — ACETAMINOPHEN 325 MG PO TABS
650.0000 mg | ORAL_TABLET | Freq: Four times a day (QID) | ORAL | Status: DC | PRN
  Administered 2023-01-27: 650 mg via ORAL
  Filled 2023-01-24: qty 2

## 2023-01-24 NOTE — ED Provider Notes (Signed)
.  Critical Care  Performed by: Sabas Sous, MD Authorized by: Sabas Sous, MD   Critical care provider statement:    Critical care time (minutes):  35   Critical care was necessary to treat or prevent imminent or life-threatening deterioration of the following conditions: perforated diverticulitis.   Critical care was time spent personally by me on the following activities:  Development of treatment plan with patient or surrogate, discussions with consultants, evaluation of patient's response to treatment, examination of patient, ordering and review of laboratory studies, ordering and review of radiographic studies, ordering and performing treatments and interventions, pulse oximetry, re-evaluation of patient's condition and review of old charts  I spoke with radiology regarding the patient's imaging.  I also spoke with poison control and received recommendations from the toxicologist.  This is a difficult case as there is some evidence to suggest perforated diverticulitis but there is air along the mesentery likely related to ingestion of hydrogen peroxide.  After further discussion with radiology, an addendum was made to the CT imaging interpretation.  Perforated diverticulitis less likely but still possible.  The air now is more likely related to the hydroperoxide.  However, patient is in no acute distress with normal vital signs, minimal pain, soft abdomen.  Per toxicology, hyperbarics in this situation is controversial at best.  Likely not the safest approach to ignore the possibility of perforated diverticulitis, which would take precedent.  And so, will consult general surgery for recommendations but the plan is likely to admit here at [facility], provide IV antibiotics.   Sabas Sous, MD 01/24/23 [phone]

## 2023-01-24 NOTE — Consult Note (Signed)
Renal Service Consult Note [facility]  HOYLE MONTERO 01/24/2023 [name], MD Requesting Physician: Dr. Sunnie Nielsen  Reason for Consult: ESRD pt w/ caustic ingestion and high K+ HPI: The patient is a 57 y.o. year-old w/ PMH as below who presented to ED after ingested hydrogen peroxide orally and per rectum. Afterwards his stomach felt real bad w/ pain + nausea. No vomiting. Seen in ED where K+ 5.7, BP 190/108, WBC 14K, LA wnl. Head CT neg. CT a/p showed suspected mesenteric venous gas along the sigmoid colon and it's mesentery, would go along w/ hx of ingestion. Seen by gen surg, supportive care, no surgery at this time. Pt admitted. We are asked to see for dialysis.   Pt seen in ED.  Pt missed HD on Wed this week. Usually has good compliance. K+ repeated at 6.5, getting IV ins/ glucose, IV bicarb and albuterol 10mg  per nebs.   Pt seen in ED. No c/o's. Wants to drink something, but he is NPO. No CP, SOB or fevers.   ROS - denies CP, no joint pain, no HA, no blurry vision, no rash, no diarrhea, no nausea/ vomiting, no dysuria, no difficulty voiding   Past Medical History  Past Medical History:  Diagnosis Date   Bipolar disorder (HCC)    Central retinal artery occlusion    with macular infarction of the right eye. status post posterior vitrectomy and photocoagulation with insertion of a shunt in 2006.   CHF (congestive heart failure) (HCC)    chronic mixed syst/diast. 02/2009 echo: Systolic function was mildly reduced. The estimated ejection fraction was in 45%, global HK, Grade I Diast dysfxn.   Chronic kidney disease    ESRD   Heart failure    Obesity    Osteoarthritis    s/p right hip replacement in 2001   Schizophrenia (HCC)    Severe uncontrolled hypertension    Past Surgical History  Past Surgical History:  Procedure Laterality Date   AV FISTULA PLACEMENT Right 07/08/2022   Procedure: RIGHT RADIOCEPHALIC ARTERIOVENOUS (AV) FISTULA CREATION;  Surgeon:  Victorino Sparrow, MD;  Location: [facility] OR;  Service: Vascular;  Laterality: Right;   IR FLUORO GUIDE CV LINE RIGHT  05/13/2022   IR US GUIDE VASC ACCESS RIGHT  05/13/2022   JOINT REPLACEMENT     right hip replacement   right eye surgery     for glaucoma   TOTAL HIP ARTHROPLASTY Bilateral 1997, 2001   Family History  Family History  Problem Relation Age of Onset   Hypertension Father    Stroke Father    Social History  reports that he has quit smoking. He has never used smokeless tobacco. He reports that he does not drink alcohol and does not use drugs. Allergies  Allergies  Allergen Reactions   Penicillins Other (See Comments)    Unknown childhood reaction   Home medications Prior to Admission medications   Medication Sig Start Date End Date Taking? Authorizing Provider  carvedilol (COREG) 6.25 MG tablet Take 1 tablet (6.25 mg total) by mouth 2 (two) times daily with a meal. 09/17/22  Yes   isosorbide mononitrate (IMDUR) 60 MG 24 hr tablet Take 1 tablet (60 mg total) by mouth daily. 12/18/22  Yes   lidocaine-prilocaine (EMLA) cream Apply 1 a small amount to skin three times a week apply to AVF 30-60 min prior to dialysis, wrap site w plastic wrap 09/12/22  Yes   rosuvastatin (CRESTOR) 5 MG tablet Take 1 tablet by  mouth once a day 11/21/22  Yes   sucroferric oxyhydroxide (VELPHORO) 500 MG chewable tablet Chew 2 tablets (1,000 mg total) by mouth with breakfast, with lunch, and with evening meal. 08/08/22  Yes   carvedilol (COREG) 6.25 MG tablet Take 1 tablet (6.25 mg total) by mouth 2 (two) times daily with a meal. 06/15/22 09/15/22  Estanislado Emms, MD     Vitals:   01/24/23 0725 01/24/23 0800 01/24/23 0815 01/24/23 1024  BP: (!) 155/95 (!) 154/96 (!) 151/89   Pulse: 95 93 90   Resp: 17     Temp: 98.4 F (36.9 C)   98 F (36.7 C)  TempSrc: Oral   Oral  SpO2: 100% 100% 100%    Exam Gen alert, no distress No rash, cyanosis or gangrene Sclera anicteric, throat clear  No jvd or  bruits Chest clear bilat to bases, no rales/ wheezing RRR no MRG Abd soft ntnd no mass or ascites +bs GU defer MS no joint effusions or deformity Ext no LE or UE edema, no wounds or ulcers Neuro is alert, Ox 3 , nf    RUA AVF+bruit    Renal-related home meds: - coreg 6.25 bid - velphoro 1gm ac tid  - imdur 60 every day    OP HD: MWF G-O  4h  400/600  75.4kg  2K/3Ca bath   RUA AVF  Heparin 2400 - last OP HD 9/16, post wt 74.7kg - coming off 0.5kg under dry wt; dry wt up and down last 3 wks - rocaltrol 0.25 mcg po three times per week - mircera 75 mcg IV q 2 wks, last 9/04, due 9/18 (pt missed)   CXR 9/19 - vasc congestion, no edema   Assessment/ Plan: Caustic ingestion - of H2O2. CT abd showed prob mesenteric venous gas along the sigmoid colon. Started on IV zosyn, gen surg is following. Pain meds prn.  Hyperkalemia - getting full temporizing measures in ED. Plan HD upstairs as soon a spot comes open. Low K bath.  ESRD - on HD MWF. Missed Wed HD. Plan HD today as above.  HTN - bp's good, cont home meds Volume - euvolemic on exam, UF goal 2-3 L Anemia esrd - Hb 10-11 here, missed recent esa dose. Will consider darbe while here is Hb drops < 10.  MBD ckd - CCa in range, add on phos. Cont po vdra and binder.  Bipolar d/o and schizophrenia       Vinson Moselle  MD [facility] 01/24/2023, 10:40 AM  Recent Labs  Lab 01/23/23 2334 01/23/23 2340 01/24/23 0713  HGB 10.5* 11.6*  11.2* 11.1*  ALBUMIN 4.3  --  3.8  CALCIUM 8.3*  --  8.0*  CREATININE 11.65* 12.70* 12.18*  K 5.8* 5.7*  5.7* 6.6*   Inpatient medications:  calcitRIOL  0.25 mcg Oral Q M,W,F   Chlorhexidine Gluconate Cloth  6 each Topical Q0600   heparin  2,400 Units Dialysis Once in dialysis   heparin  5,000 Units Subcutaneous Q8H   hydrALAZINE  10 mg Intravenous Q6H   pantoprazole (PROTONIX) IV  40 mg Intravenous Q12H   sodium chloride flush  3 mL Intravenous Q12H   sucroferric oxyhydroxide  1,000 mg Oral TID WC     sodium chloride     anticoagulant sodium citrate     piperacillin-tazobactam (ZOSYN)  IV Stopped (01/24/23 0701)   sodium chloride, acetaminophen **OR** acetaminophen, alteplase, anticoagulant sodium citrate, feeding supplement (NEPRO CARB STEADY), heparin, lidocaine (PF), lidocaine-prilocaine, metoprolol tartrate, ondansetron **  OR** ondansetron (ZOFRAN) IV, pentafluoroprop-tetrafluoroeth, sodium chloride flush

## 2023-01-24 NOTE — Progress Notes (Signed)
Pt receives out-pt HD at [facility] on MWF. Will assist as needed.   Olivia Canter Renal Navigator [phone]

## 2023-01-24 NOTE — Care Management Obs Status (Signed)
MEDICARE OBSERVATION STATUS NOTIFICATION   Patient Details  Name: [name] MRN: [id number] Date of Birth: [birth year]   Medicare Observation Status Notification Given:  Yes    Kingsley Plan, RN 01/24/2023, 3:54 PM

## 2023-01-24 NOTE — H&P (Addendum)
PCP:   Marcine Matar, MD   Chief Complaint:  Abdominal pain.  HPI: This is a 57y/o male w/ PMH of Bipolar disease and schizophrenia, HTN, ESRD(M/W/F), HFrEF. He presents with abdominal pain.  Per patient he wanted to do a cleanse.  He ingested 4 ounces of hydroperoxide orally and use a small amount of hydroperoxide to make an enema to clean his colon,.  After injection his stomach felt real bad, like it was stopped up.  He had generalized abdominal pain. He had nausea initially but no vomiting. He came to the ER  In the ER patient hemodynamically stable except for elevated blood pressure 194/108.  Potassium 5.7, hemoglobin 1.6, WBCs 14.8 lactic acid normal UDS negative CT head normal.  CT abdomen and pelvis showed gas initially thought to be perforation however radiologist added a correction after patient's ingestion place in context: "free intraperitoneal gas" along the sigmoid colon and its mesentery, mesenteric venous gas is more likely in the setting of given history: hydrogen peroxide ingestion PO and rectal. Perforated acute diverticulitis is not excluded but less likely (7:65.) Concern for sigmoiditis and proctitis with mesenteric venous gas related to hydrogen peroxide ingestion/rectal administration. No pneumatosis identified. No free intraperitoneal fluid. No portal venous gas identified. Recommend colonoscopy status post treatment and status post complete resolution of inflammatory changes to exclude an underlying lesion.  Surgical consult placed by EDP.   Review of Systems:  Per HPI  Past Medical History: Past Medical History:  Diagnosis Date   Bipolar disorder (HCC)    Central retinal artery occlusion    with macular infarction of the right eye. status post posterior vitrectomy and photocoagulation with insertion of a shunt in 2006.   CHF (congestive heart failure) (HCC)    chronic mixed syst/diast. 02/2009 echo: Systolic function was mildly reduced. The estimated  ejection fraction was in 45%, global HK, Grade I Diast dysfxn.   Chronic kidney disease    ESRD   Heart failure    Obesity    Osteoarthritis    s/p right hip replacement in 2001   Schizophrenia (HCC)    Severe uncontrolled hypertension    Past Surgical History:  Procedure Laterality Date   AV FISTULA PLACEMENT Right 07/08/2022   Procedure: RIGHT RADIOCEPHALIC ARTERIOVENOUS (AV) FISTULA CREATION;  Surgeon: Victorino Sparrow, MD;  Location: [facility] OR;  Service: Vascular;  Laterality: Right;   IR FLUORO GUIDE CV LINE RIGHT  05/13/2022   IR US GUIDE VASC ACCESS RIGHT  05/13/2022   JOINT REPLACEMENT     right hip replacement   right eye surgery     for glaucoma   TOTAL HIP ARTHROPLASTY Bilateral 1997, 2001    Medications: Prior to Admission medications   Medication Sig Start Date End Date Taking? Authorizing Provider  calcium acetate (PHOSLO) 667 MG capsule Take 2 capsules (1,334 mg total) by mouth 3 (three) times daily with meals. Patient not taking: Reported on 09/02/2022 06/15/22   Estanislado Emms, MD  carvedilol (COREG) 6.25 MG tablet Take 1 tablet (6.25 mg total) by mouth 2 (two) times daily with a meal. 06/15/22 09/15/22  Estanislado Emms, MD  carvedilol (COREG) 6.25 MG tablet Take 1 tablet (6.25 mg total) by mouth 2 (two) times daily with a meal. 09/17/22     isosorbide mononitrate (IMDUR) 60 MG 24 hr tablet Take 1 tablet (60 mg total) by mouth daily. 06/15/22 09/15/22  Estanislado Emms, MD  isosorbide mononitrate (IMDUR) 60 MG 24 hr tablet Take 1  tablet (60 mg total) by mouth daily. 09/17/22     isosorbide mononitrate (IMDUR) 60 MG 24 hr tablet Take 1 tablet (60 mg total) by mouth daily. 12/18/22     lidocaine-prilocaine (EMLA) cream Apply 1 a small amount to skin three times a week apply to AVF 30-60 min prior to dialysis, wrap site w plastic wrap 09/12/22     rosuvastatin (CRESTOR) 5 MG tablet Take 1 tablet (5 mg total) by mouth daily. 06/15/22 12/21/22  Estanislado Emms, MD  rosuvastatin (CRESTOR) 5 MG  tablet Take 1 tablet by mouth once a day 11/21/22     sucroferric oxyhydroxide (VELPHORO) 500 MG chewable tablet Chew 2 tablets (1,000 mg total) by mouth with breakfast, with lunch, and with evening meal. 08/08/22       Allergies:   Allergies  Allergen Reactions   Penicillins Other (See Comments)    childhood    Social History:  reports that he has quit smoking. He has never used smokeless tobacco. He reports that he does not drink alcohol and does not use drugs.  Family History: Family History  Problem Relation Age of Onset   Hypertension Father    Stroke Father     Physical Exam: Vitals:   01/24/23 0330 01/24/23 0356 01/24/23 0400 01/24/23 0445  BP: (!) 173/103  (!) 175/101 (!) 182/106  Pulse: 82  88 80  Resp:   14 16  Temp:  98.1 F (36.7 C)    TempSrc:  Oral    SpO2: 100%  100% 100%    General:  Alert and oriented times three, well developed and nourished, no acute distress Eyes: no scleral icterus ENT: Moist oral mucosa, neck supple, no thyromegaly Lungs: clear to ascultation, no wheeze, no crackles, no use of accessory muscles Cardiovascular: regular rate and rhythm, no regurgitation, no gallops, no murmurs. No carotid bruits, no JVD Abdomen: soft, positive BS, mild nonspecific tenderness to palpation,, no organomegaly, not an acute abdomen GU: not examined Neuro: CN II - XII grossly intact, sensation intact Musculoskeletal: strength 5/5 all extremities, no clubbing, cyanosis or edema Skin: no rash, no subcutaneous crepitation, no decubitus Psych: appropriate patient   Labs on Admission:  Recent Labs    01/23/23 2334 01/23/23 2340  NA 137 135  135  K 5.8* 5.7*  5.7*  CL 98 104  CO2 17*  --   GLUCOSE 108* 100*  BUN 90* 101*  CREATININE 11.65* 12.70*  CALCIUM 8.3*  --    Recent Labs    01/23/23 2334  AST 13*  ALT 19  ALKPHOS 54  BILITOT 1.2  PROT 8.0  ALBUMIN 4.3    Recent Labs    01/23/23 2334 01/23/23 2340  WBC 14.8*  --   NEUTROABS  13.2*  --   HGB 10.5* 11.6*  11.2*  HCT 30.9* 34.0*  33.0*  MCV 90.1  --   PLT 247  --     Radiological Exams on Admission: CT ABDOMEN PELVIS W CONTRAST  Addendum Date: 01/24/2023   ADDENDUM REPORT: 01/24/2023 01:18 ADDENDUM: Given distribution of the previously mentioned "free intraperitoneal gas" along the sigmoid colon and its mesentery, mesenteric venous gas is more likely in the setting of given history: hydrogen peroxide ingestion PO and rectal. Perforated acute diverticulitis is not excluded but less likely (7:65.) Concern for sigmoiditis and proctitis with mesenteric venous gas related to hydrogen peroxide ingestion/rectal administration. No pneumatosis identified. No free intraperitoneal fluid. No portal venous gas identified. Recommend colonoscopy status post treatment  and status post complete resolution of inflammatory changes to exclude an underlying lesion. These results were called by telephone at the time of interpretation on 01/24/2023 at 1:03 am to provider Dr. Pilar Plate, who verbally acknowledged these results. Electronically Signed   By: Tish Frederickson M.D.   On: 01/24/2023 01:18   Result Date: 01/24/2023 CLINICAL DATA:  RUQ PAIN, HYDROGEN PEROXIDE INGESTION EXAM: CT ABDOMEN AND PELVIS WITH CONTRAST TECHNIQUE: Multidetector CT imaging of the abdomen and pelvis was performed using the standard protocol following bolus administration of intravenous contrast. RADIATION DOSE REDUCTION: This exam was performed according to the departmental dose-optimization program which includes automated exposure control, adjustment of the mA and/or kV according to patient size and/or use of iterative reconstruction technique. CONTRAST:  60mL OMNIPAQUE IOHEXOL 350 MG/ML SOLN COMPARISON:  None Available. FINDINGS: Lower chest: Prominent heart size.  No acute abnormality. Hepatobiliary: No focal liver abnormality. Calcified gallstone noted within the gallbladder lumen. No gallbladder wall thickening or  pericholecystic fluid. No biliary dilatation. Pancreas: No focal lesion. Normal pancreatic contour. No surrounding inflammatory changes. No main pancreatic ductal dilatation. Spleen: Normal in size without focal abnormality. Adrenals/Urinary Tract: No adrenal nodule bilaterally. Bilateral atrophic kidneys. Bilateral kidneys enhance symmetrically. No hydronephrosis. No hydroureter. Limited evaluation of the urinary bladder lumen due to streak artifact originating from bilateral femoral surgical hardware. The urinary bladder is unremarkable. Stomach/Bowel: Stomach is within normal limits. No evidence of small bowel wall thickening or dilatation. Mid to distal sigmoid bowel wall thickening with associated mild pericolonic fat stranding and free foci of gas scattered along the sigmoid colon, pelvis and mid abdomen. No intramural abscess formation. Circumferential bowel thickening of the rectum. Limited evaluation of the rectum due to streak artifact originating from bilateral femoral surgical hardware.Appendix appears normal. Vascular/Lymphatic: No abdominal aorta or iliac aneurysm. Mild atherosclerotic plaque of the aorta and its branches. No abdominal, pelvic, or inguinal lymphadenopathy. Reproductive: Limited evaluation due to streak artifact originating from bilateral femoral surgical hardware. Other: No intraperitoneal free fluid. Abdomen and pelvis positive for free intraperitoneal gas grossly scattered throughout the mesentery of the sigmoid colon but also noted within the mid abdomen and pelvis. No organized fluid collection. Musculoskeletal: No abdominal wall hernia or abnormality. No suspicious lytic or blastic osseous lesions. No acute displaced fracture. Multilevel severe degenerative changes of the spine. Bilateral total hip arthroplasty. IMPRESSION: 1. Colonic diverticulosis with acute perforated mid to distal sigmoid colon diverticulitis. No organized fluid collection. Recommend colonoscopy status post  treatment and status post complete resolution of inflammatory changes to exclude an underlying lesion. 2. Circumferential bowel wall thickening of the rectum which may represent reactive changes versus proctitis. Limited evaluation of the rectum due to streak artifact originating from bilateral femoral surgical hardware. 3. Cholelithiasis with no CT finding of acute cholecystitis. 4. Atrophic kidneys. 5.  Aortic Atherosclerosis (ICD10-I70.0). Electronically Signed: By: Tish Frederickson M.D. On: 01/24/2023 00:59   CT HEAD WO CONTRAST ( )  Result Date: 01/24/2023 CLINICAL DATA:  Delirium EXAM: CT HEAD WITHOUT CONTRAST TECHNIQUE: Contiguous axial images were obtained from the base of the skull through the vertex without intravenous contrast. RADIATION DOSE REDUCTION: This exam was performed according to the departmental dose-optimization program which includes automated exposure control, adjustment of the mA and/or kV according to patient size and/or use of iterative reconstruction technique. COMPARISON:  CT head 04/20/2013, MRI head 09/11/2011 FINDINGS: Brain: Patchy and confluent areas of decreased attenuation are noted throughout the deep and periventricular white matter of the cerebral hemispheres bilaterally, compatible with  chronic microvascular ischemic disease. No evidence of large-territorial acute infarction. No parenchymal hemorrhage. No mass lesion. No extra-axial collection. No mass effect or midline shift. No hydrocephalus. Basilar cisterns are patent. Vascular: No hyperdense vessel. Skull: No acute fracture or focal lesion. Sinuses/Orbits: Bilateral maxillary sinus mucosal thickening. Otherwise paranasal sinuses and mastoid air cells are clear. 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 The right orbit is unremarkable. Other: None. IMPRESSION: No acute intracranial abnormality. Electronically Signed   By: Tish Frederickson M.D.   On: 01/24/2023 01:01   DG Chest Portable 1 View  Result Date: 01/23/2023 CLINICAL  DATA:  Ingestion. EXAM: PORTABLE CHEST 1 VIEW COMPARISON:  Chest x-ray 05/11/2022 FINDINGS: Heart is enlarged, unchanged. There central pulmonary vascular congestion. There is no radiopaque foreign body. The lungs and costophrenic angles are clear. There is no pneumothorax or acute fracture. IMPRESSION: 1. Cardiomegaly with central pulmonary vascular congestion. 2. No radiopaque foreign body. Electronically Signed   By: Darliss Cheney M.D.   On: 01/23/2023 23:45    Assessment/Plan Present on Admission:  Caustic ingestion of hydrogen peroxide/free air in abdomen -Now thought to be less likely perforation, likely due to facing and gas formation from hydrogen peroxide itself.  Surgeon has seen patient, recommend n.p.o., serial examination.  Does not believe surgery will be needed. -IV Zosyn continued -PRN Pain medication -Pain medications as needed -N.p.o., ice chips okay -Patient states he was trying to cleanse wound.  Patient states emphatically he is not suicidal -Patient will likely need a GI consult.  Defer timing to a.m. team -Patient discussed with poison control, recommend monitoring for air emboli.  Monitor for headaches or chest pains.  Minimum 14 hours observation.  Possible sigmoiditis and proctitis with mesenteric venous gas related to hydrogen peroxide ingestion/rectal administration -Chemical inflammation -Patient kept on IV Zosyn   Bipolar disorder (HCC) //  Schizophrenia (HCC) -Patient does not believe he has either bipolar or schizophrenia.  States he does not believe in psychiatry.  Not maintained on medications.  Patient not manic and not depressed   ESRD //  Hperkalemia -Nephrologist consulted   Chronic combined systolic and diastolic CHF (congestive heart failure) (HCC) -NPO including meds.  Scheduled hydralazine ordered.  As needed Lopressor   Essential hypertension - Scheduled hydralazine ordered.  As needed Lopressor   Mixed hyperlipidemia -NPO including  meds   Paul [name] 01/24/2023, 5:24 AM

## 2023-01-24 NOTE — Consult Note (Signed)
Reason for Consult:gas in colon mesentery Referring Provider: Nada Boozer Theilen is an 57 y.o. male.  HPI: 57 yo male ingested hydrogen peroxide by mouth and via enema in order to cleanse his colon extra fast. He wants to be healthy and has some trouble with trusting the medical community. He thinks he took 2-3" worth of the enema volume. He had moderate abdominal pain earlier and it has improved some in the last few hours.  Past Medical History:  Diagnosis Date   Bipolar disorder (HCC)    Central retinal artery occlusion    with macular infarction of the right eye. status post posterior vitrectomy and photocoagulation with insertion of a shunt in 2006.   CHF (congestive heart failure) (HCC)    chronic mixed syst/diast. 02/2009 echo: Systolic function was mildly reduced. The estimated ejection fraction was in 45%, global HK, Grade I Diast dysfxn.   Chronic kidney disease    ESRD   Heart failure    Obesity    Osteoarthritis    s/p right hip replacement in 2001   Schizophrenia (HCC)    Severe uncontrolled hypertension     Past Surgical History:  Procedure Laterality Date   AV FISTULA PLACEMENT Right 07/08/2022   Procedure: RIGHT RADIOCEPHALIC ARTERIOVENOUS (AV) FISTULA CREATION;  Surgeon: Victorino Sparrow, MD;  Location: [facility] OR;  Service: Vascular;  Laterality: Right;   IR FLUORO GUIDE CV LINE RIGHT  05/13/2022   IR US GUIDE VASC ACCESS RIGHT  05/13/2022   JOINT REPLACEMENT     right hip replacement   right eye surgery     for glaucoma   TOTAL HIP ARTHROPLASTY Bilateral 1997, 2001    Family History  Problem Relation Age of Onset   Hypertension Father    Stroke Father     Social History:  reports that he has quit smoking. He has never used smokeless tobacco. He reports that he does not drink alcohol and does not use drugs.  Allergies:  Allergies  Allergen Reactions   Penicillins Other (See Comments)    childhood    Medications: I have reviewed the patient's  current medications.  Results for orders placed or performed during the hospital encounter of 01/23/23 (from the past 48 hour(s))  CBC with Differential     Status: Abnormal   Collection Time: 01/23/23 11:34 PM  Result Value Ref Range   WBC 14.8 (H) 4.0 - 10.5 K/uL   RBC 3.43 (L) 4.22 - 5.81 MIL/uL   Hemoglobin 10.5 (L) 13.0 - 17.0 g/dL   HCT 16.1 (L) 09.6 - 04.5 %   MCV 90.1 80.0 - 100.0 fL   MCH 30.6 26.0 - 34.0 pg   MCHC 34.0 30.0 - 36.0 g/dL   RDW 40.9 81.1 - 91.4 %   Platelets 247 150 - 400 K/uL   nRBC 0.0 0.0 - 0.2 %   Neutrophils Relative % 89 %   Neutro Abs 13.2 (H) 1.7 - 7.7 K/uL   Lymphocytes Relative 5 %   Lymphs Abs 0.7 0.7 - 4.0 K/uL   Monocytes Relative 4 %   Monocytes Absolute 0.6 0.1 - 1.0 K/uL   Eosinophils Relative 0 %   Eosinophils Absolute 0.0 0.0 - 0.5 K/uL   Basophils Relative 0 %   Basophils Absolute 0.0 0.0 - 0.1 K/uL   Immature Granulocytes 2 %   Abs Immature Granulocytes 0.26 (H) 0.00 - 0.07 K/uL    Comment: Performed at [facility], [address], [place], [place] [address]  Comprehensive metabolic panel     Status: Abnormal   Collection Time: 01/23/23 11:34 PM  Result Value Ref Range   Sodium 137 135 - 145 mmol/L   Potassium 5.8 (H) 3.5 - 5.1 mmol/L   Chloride 98 98 - 111 mmol/L   CO2 17 (L) 22 - 32 mmol/L   Glucose, Bld 108 (H) 70 - 99 mg/dL    Comment: Glucose reference range applies only to samples taken after fasting for at least 8 hours.   BUN 90 (H) 6 - 20 mg/dL   Creatinine, Ser 60.45 (H) 0.61 - 1.24 mg/dL   Calcium 8.3 (L) 8.9 - 10.3 mg/dL   Total Protein 8.0 6.5 - 8.1 g/dL   Albumin 4.3 3.5 - 5.0 g/dL   AST 13 (L) 15 - 41 U/L   ALT 19 0 - 44 U/L   Alkaline Phosphatase 54 38 - 126 U/L   Total Bilirubin 1.2 0.3 - 1.2 mg/dL   GFR, Estimated 5 (L) >60 mL/min    Comment: (NOTE) Calculated using the CKD-EPI Creatinine Equation (2021)    Anion gap 22 (H) 5 - 15    Comment: ELECTROLYTES REPEATED TO VERIFY Performed at [facility], [address] [place], [place] [address]   Ethanol     Status: None   Collection Time: 01/23/23 11:34 PM  Result Value Ref Range   Alcohol, Ethyl (B) <10 <10 mg/dL    Comment: (NOTE) Lowest detectable limit for serum alcohol is 10 mg/dL.  For medical purposes only. Performed at [facility], [address], [place], [place] [address]   Protime-INR     Status: None   Collection Time: 01/23/23 11:34 PM  Result Value Ref Range   Prothrombin Time 13.9 11.4 - 15.2 seconds   INR 1.1 0.8 - 1.2    Comment: (NOTE) INR goal varies based on device and disease states. Performed at [facility], [address] [place], [place] [address]   Salicylate level     Status: Abnormal   Collection Time: 01/23/23 11:34 PM  Result Value Ref Range   Salicylate Lvl <7.0 (L) 7.0 - 30.0 mg/dL    Comment: Performed at [facility], [address], [place], [place] [address]  I-Stat venous blood gas, ED     Status: Abnormal   Collection Time: 01/23/23 11:40 PM  Result Value Ref Range   pH, Ven 7.279 7.25 - 7.43   pCO2, Ven 37.9 (L) 44 - 60 mmHg   pO2, Ven 47 (H) 32 - 45 mmHg   Bicarbonate 17.8 (L) 20.0 - 28.0 mmol/L   TCO2 19 (L) 22 - 32 mmol/L   O2 Saturation 78 %   Acid-base deficit 8.0 (H) 0.0 - 2.0 mmol/L   Sodium 135 135 - 145 mmol/L   Potassium 5.7 (H) 3.5 - 5.1 mmol/L   Calcium, Ion 0.99 (L) 1.15 - 1.40 mmol/L   HCT 33.0 (L) 39.0 - 52.0 %   Hemoglobin 11.2 (L) 13.0 - 17.0 g/dL   Sample type VENOUS   I-stat chem 8, ED (not at [facility], [facility] or [facility])     Status: Abnormal   Collection Time: 01/23/23 11:40 PM  Result Value Ref Range   Sodium 135 135 - 145 mmol/L   Potassium 5.7 (H) 3.5 - 5.1 mmol/L   Chloride 104 98 - 111 mmol/L   BUN 101 (H) 6 - 20 mg/dL   Creatinine, Ser  12.70 (H) 0.61 - 1.24 mg/dL   Glucose, Bld 130 (H) 70 - 99 mg/dL    Comment: Glucose reference range applies only to samples taken after fasting for at least 8 hours.   Calcium, Ion 0.99 (L)  1.15 - 1.40 mmol/L   TCO2 18 (L) 22 - 32 mmol/L   Hemoglobin 11.6 (L) 13.0 - 17.0 g/dL   HCT 86.5 (L) 78.4 - 69.6 %  Lactic acid, plasma     Status: None   Collection Time: 01/24/23  1:49 AM  Result Value Ref Range   Lactic Acid, Venous 1.0 0.5 - 1.9 mmol/L    Comment: Performed at [facility], [address], [place], [place] [address]    CT ABDOMEN PELVIS W CONTRAST  Addendum Date: 01/24/2023   ADDENDUM REPORT: 01/24/2023 01:18 ADDENDUM: Given distribution of the previously mentioned "free intraperitoneal gas" along the sigmoid colon and its mesentery, mesenteric venous gas is more likely in the setting of given history: hydrogen peroxide ingestion PO and rectal. Perforated acute diverticulitis is not excluded but less likely (7:65.) Concern for sigmoiditis and proctitis with mesenteric venous gas related to hydrogen peroxide ingestion/rectal administration. No pneumatosis identified. No free intraperitoneal fluid. No portal venous gas identified. Recommend colonoscopy status post treatment and status post complete resolution of inflammatory changes to exclude an underlying lesion. These results were called by telephone at the time of interpretation on 01/24/2023 at 1:03 am to provider Dr. Pilar Plate, who verbally acknowledged these results. Electronically Signed   By: Tish Frederickson M.D.   On: 01/24/2023 01:18   Result Date: 01/24/2023 CLINICAL DATA:  RUQ PAIN, HYDROGEN PEROXIDE INGESTION EXAM: CT ABDOMEN AND PELVIS WITH CONTRAST TECHNIQUE: Multidetector CT imaging of the abdomen and pelvis was performed using the standard protocol following bolus administration of intravenous contrast. RADIATION DOSE REDUCTION: This exam was performed according to the departmental dose-optimization program which includes automated exposure control, adjustment of the mA and/or kV according to patient size and/or use of iterative reconstruction technique. CONTRAST:  60mL OMNIPAQUE IOHEXOL 350 MG/ML SOLN COMPARISON:   None Available. FINDINGS: Lower chest: Prominent heart size.  No acute abnormality. Hepatobiliary: No focal liver abnormality. Calcified gallstone noted within the gallbladder lumen. No gallbladder wall thickening or pericholecystic fluid. No biliary dilatation. Pancreas: No focal lesion. Normal pancreatic contour. No surrounding inflammatory changes. No main pancreatic ductal dilatation. Spleen: Normal in size without focal abnormality. Adrenals/Urinary Tract: No adrenal nodule bilaterally. Bilateral atrophic kidneys. Bilateral kidneys enhance symmetrically. No hydronephrosis. No hydroureter. Limited evaluation of the urinary bladder lumen due to streak artifact originating from bilateral femoral surgical hardware. The urinary bladder is unremarkable. Stomach/Bowel: Stomach is within normal limits. No evidence of small bowel wall thickening or dilatation. Mid to distal sigmoid bowel wall thickening with associated mild pericolonic fat stranding and free foci of gas scattered along the sigmoid colon, pelvis and mid abdomen. No intramural abscess formation. Circumferential bowel thickening of the rectum. Limited evaluation of the rectum due to streak artifact originating from bilateral femoral surgical hardware.Appendix appears normal. Vascular/Lymphatic: No abdominal aorta or iliac aneurysm. Mild atherosclerotic plaque of the aorta and its branches. No abdominal, pelvic, or inguinal lymphadenopathy. Reproductive: Limited evaluation due to streak artifact originating from bilateral femoral surgical hardware. Other: No intraperitoneal free fluid. Abdomen and pelvis positive for free intraperitoneal gas grossly scattered throughout the mesentery of the sigmoid colon but also noted within the mid abdomen and pelvis. No organized fluid collection. Musculoskeletal: No abdominal wall hernia or abnormality. No suspicious  lytic or blastic osseous lesions. No acute displaced fracture. Multilevel severe degenerative changes  of the spine. Bilateral total hip arthroplasty. IMPRESSION: 1. Colonic diverticulosis with acute perforated mid to distal sigmoid colon diverticulitis. No organized fluid collection. Recommend colonoscopy status post treatment and status post complete resolution of inflammatory changes to exclude an underlying lesion. 2. Circumferential bowel wall thickening of the rectum which may represent reactive changes versus proctitis. Limited evaluation of the rectum due to streak artifact originating from bilateral femoral surgical hardware. 3. Cholelithiasis with no CT finding of acute cholecystitis. 4. Atrophic kidneys. 5.  Aortic Atherosclerosis (ICD10-I70.0). Electronically Signed: By: Tish Frederickson M.D. On: 01/24/2023 00:59   CT HEAD WO CONTRAST ( )  Result Date: 01/24/2023 CLINICAL DATA:  Delirium EXAM: CT HEAD WITHOUT CONTRAST TECHNIQUE: Contiguous axial images were obtained from the base of the skull through the vertex without intravenous contrast. RADIATION DOSE REDUCTION: This exam was performed according to the departmental dose-optimization program which includes automated exposure control, adjustment of the mA and/or kV according to patient size and/or use of iterative reconstruction technique. COMPARISON:  CT head 04/20/2013, MRI head 09/11/2011 FINDINGS: Brain: Patchy and confluent areas of decreased attenuation are noted throughout the deep and periventricular white matter of the cerebral hemispheres bilaterally, compatible with chronic microvascular ischemic disease. No evidence of large-territorial acute infarction. No parenchymal hemorrhage. No mass lesion. No extra-axial collection. No mass effect or midline shift. No hydrocephalus. Basilar cisterns are patent. Vascular: No hyperdense vessel. Skull: No acute fracture or focal lesion. Sinuses/Orbits: Bilateral maxillary sinus mucosal thickening. Otherwise paranasal sinuses and mastoid air cells are clear. 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 The right  orbit is unremarkable. Other: None. IMPRESSION: No acute intracranial abnormality. Electronically Signed   By: Tish Frederickson M.D.   On: 01/24/2023 01:01   DG Chest Portable 1 View  Result Date: 01/23/2023 CLINICAL DATA:  Ingestion. EXAM: PORTABLE CHEST 1 VIEW COMPARISON:  Chest x-ray 05/11/2022 FINDINGS: Heart is enlarged, unchanged. There central pulmonary vascular congestion. There is no radiopaque foreign body. The lungs and costophrenic angles are clear. There is no pneumothorax or acute fracture. IMPRESSION: 1. Cardiomegaly with central pulmonary vascular congestion. 2. No radiopaque foreign body. Electronically Signed   By: Darliss Cheney M.D.   On: 01/23/2023 23:45    ROS  PE Blood pressure (!) 193/95, pulse 83, temperature 97.7 F (36.5 C), temperature source Oral, resp. rate 15, SpO2 100%. Constitutional: NAD; conversant; no deformities Eyes: Moist conjunctiva; no lid lag; anicteric; PERRL Neck: Trachea midline; no thyromegaly Lungs: Normal respiratory effort; no tactile fremitus CV: RRR; no palpable thrills; no pitting edema GI: Abd soft, minimal tenderness lower abdomen; no palpable hepatosplenomegaly MSK: Normal gait; no clubbing/cyanosis Psychiatric: Appropriate affect; alert and oriented x3 Lymphatic: No palpable cervical or axillary lymphadenopathy Skin: No major subcutaneous nodules. Warm and dry   Assessment/Plan: 57 yo male with ESRD, CHF, and bipolar disease presents with abdominal pain after hydrogen peroxide intake by enema. -NPO, ice chips ok -IV abx -I think the gas is likely related to the chemical administration, he is low risk to need a surgery from this -serial exams  I reviewed CT scan images showing small amounts of area within the mesentery in the rectum and sigmoid colon. I does not have the narrowed thickened appearance typical of sigmoid diverticulitis  I reviewed last 24 h vitals and pain scores, last 48 h intake and output, last 24 h labs and trends,  and last 24 h imaging results.  This care required  high  level of medical decision making.   De Blanch Amaru Burroughs 01/24/2023, 2:16 AM

## 2023-01-24 NOTE — Progress Notes (Signed)
Pharmacy Antibiotic Note  Paul [name] is a 57 y.o. male admitted on 01/23/2023 with perforated diverticulum. Of note, ESRD on HD MWF.  Zosyn x 1 in ED. Pharmacy has been consulted for zosyn dosing.  Plan: Zosyn 2.25g q8h F/u HD plans and length of therapy    Temp (24hrs), Avg:97.9 F (36.6 C), Min:97.7 F (36.5 C), Max:98.1 F (36.7 C)  Recent Labs  Lab 01/23/23 2334 01/23/23 2340 01/24/23 0149  WBC 14.8*  --   --   CREATININE 11.65* 12.70*  --   LATICACIDVEN  --   --  1.0    CrCl cannot be calculated (Unknown ideal weight.).    Allergies  Allergen Reactions   Penicillins Other (See Comments)    childhood    Antimicrobials this admission: Zosyn 9/19 >  Thank you for allowing pharmacy to be a part of this patient's care.  Marja Kays 01/24/2023 5:26 AM

## 2023-01-24 NOTE — Progress Notes (Signed)
PROGRESS NOTE    Paul [name]  ZOX:[id number] DOB: Nov 25, 1965 DOA: 01/23/2023 PCP: Marcine Matar, MD   Brief Narrative: 57 year old with past medical history significant for bipolar disorder, schizophrenia, hypertension, ESRD (MWF) heart failure reduced ejection fraction presents with abdominal pain.  Patient relates that he wanted to do with cleanse.  He ingested 4 ounces of hydroxy peroxide orally and use a small amount of hydroperoxide  to make an enema to clean his colon.  After injection he developed abdominal pain, nausea and presented to the ED for further evaluation.  Evaluation in the ED he was found to be hypertensive systolic blood pressure 194, potassium 5.7, white blood cell 14, CT head normal.  CT abdomen and pelvis showed gas initially thought to be perforation however radiologist added a correction: "Free intraperitoneal gas along the sigmoid and its mesentery, mesenteric venous gas is more likely in the setting of given history of hydrogen peroxide ingestion p.o. or rectal.  Perforated acute diverticulitis is not excluded but less likely.  Concern for sigmoiditis and proctitis with mesenteric venous gas related to hydrogen peroxide injection rectal administration.  No free intraperitoneal fluid".   Assessment & Plan:   Principal Problem:   Ingestion of caustic substance Active Problems:   Essential hypertension   Schizophrenia (HCC)   Bipolar disorder (HCC)   Chronic combined systolic and diastolic CHF (congestive heart failure) (HCC)   Mixed hyperlipidemia   Hypocalcemia   Perforated diverticulum   ESRD (end stage renal disease) (HCC)   1-Caustic Ingestion hydrogen peroxide -Case was discussed with poison control, recommended monitoring for air emboli, monitor for headaches or chest pain.  Minimal in 14 hours observation. -patient denies chest pain, headaches.  -report some abdominal pain, but not worse.  -he was trying to cleanse his colon. He denies trying  to harm himself.   Possible sigmoiditis and proctitis with mesenteric venous gas related to hydrogen peroxide injection rectal administration -Surgery Following.  -NPO.  -IV Zosyn.  -IV protonix.   Bipolar, schizophrenia: Not on medications.  Answer questions appropriately.   ESRD hyperkalemia, metabolic acidosis.  K at 6.6 Calcium gluconate, amp bicarb, insulin amp D 50  Nephrology consulted for HD.   Chronic combined systolic and diastolic heart failure Volume manage with HD.   Essential hypertension Hydralazine.   Mixed hyperlipidemia:       Estimated body mass index is 29.41 kg/m as calculated from the following:   Height as of 11/29/22: 5\' 3"  (1.6 m).   Weight as of 11/29/22: 75.3 kg.   DVT prophylaxis: heparin  Code Status: Full code Family Communication: Care discussed with patient.  Disposition Plan:  Status is: Inpatient Remains inpatient appropriate because: management of hyperkalemia, ingestion of caustic    Consultants:  Surgery Nephrology   Procedures:    Antimicrobials:    Subjective: He report left Lower Quadrant pain. He was trying to have BM, couldn't today.  He denies chest pain, or headaches.   Objective: Vitals:   01/24/23 0400 01/24/23 0445 01/24/23 0615 01/24/23 0725  BP: (!) 175/101 (!) 182/106 (!) 161/95 (!) 155/95  Pulse: 88 80 85 95  Resp: 14 16 20 17   Temp:   98.8 F (37.1 C) 98.4 F (36.9 C)  TempSrc:   Oral Oral  SpO2: 100% 100% 99% 100%   No intake or output data in the 24 hours ending 01/24/23 0748 There were no vitals filed for this visit.  Examination:  General exam: Appears calm and comfortable  Respiratory system:  Clear to auscultation. Respiratory effort normal. Cardiovascular system: S1 & S2 heard, RRR. Gastrointestinal system: Abdomen is nondistended, soft and nontender.  Central nervous system: Alert and oriented.  Extremities: Symmetric 5 x 5 power.   Data Reviewed: I have personally reviewed  following labs and imaging studies  CBC: Recent Labs  Lab 01/23/23 2334 01/23/23 2340  WBC 14.8*  --   NEUTROABS 13.2*  --   HGB 10.5* 11.6*  11.2*  HCT 30.9* 34.0*  33.0*  MCV 90.1  --   PLT 247  --    Basic Metabolic Panel: Recent Labs  Lab 01/23/23 2334 01/23/23 2340  NA 137 135  135  K 5.8* 5.7*  5.7*  CL 98 104  CO2 17*  --   GLUCOSE 108* 100*  BUN 90* 101*  CREATININE 11.65* 12.70*  CALCIUM 8.3*  --    GFR: CrCl cannot be calculated (Unknown ideal weight.). Liver Function Tests: Recent Labs  Lab 01/23/23 2334  AST 13*  ALT 19  ALKPHOS 54  BILITOT 1.2  PROT 8.0  ALBUMIN 4.3   No results for input(s): "LIPASE", "AMYLASE" in the last 168 hours. No results for input(s): "AMMONIA" in the last 168 hours. Coagulation Profile: Recent Labs  Lab 01/23/23 2334  INR 1.1   Cardiac Enzymes: No results for input(s): "CKTOTAL", "CKMB", "CKMBINDEX", "TROPONINI" in the last 168 hours. BNP (last 3 results) No results for input(s): "PROBNP" in the last 8760 hours. HbA1C: No results for input(s): "HGBA1C" in the last 72 hours. CBG: No results for input(s): "GLUCAP" in the last 168 hours. Lipid Profile: No results for input(s): "CHOL", "HDL", "LDLCALC", "TRIG", "CHOLHDL", "LDLDIRECT" in the last 72 hours. Thyroid Function Tests: No results for input(s): "TSH", "T4TOTAL", "FREET4", "T3FREE", "THYROIDAB" in the last 72 hours. Anemia Panel: No results for input(s): "VITAMINB12", "FOLATE", "FERRITIN", "TIBC", "IRON", "RETICCTPCT" in the last 72 hours. Sepsis Labs: Recent Labs  Lab 01/24/23 0149 01/24/23 0713  LATICACIDVEN 1.0 1.8    No results found for this or any previous visit (from the past 240 hour(s)).       Radiology Studies: CT ABDOMEN PELVIS W CONTRAST  Addendum Date: 01/24/2023   ADDENDUM REPORT: 01/24/2023 01:18 ADDENDUM: Given distribution of the previously mentioned "free intraperitoneal gas" along the sigmoid colon and its mesentery,  mesenteric venous gas is more likely in the setting of given history: hydrogen peroxide ingestion PO and rectal. Perforated acute diverticulitis is not excluded but less likely (7:65.) Concern for sigmoiditis and proctitis with mesenteric venous gas related to hydrogen peroxide ingestion/rectal administration. No pneumatosis identified. No free intraperitoneal fluid. No portal venous gas identified. Recommend colonoscopy status post treatment and status post complete resolution of inflammatory changes to exclude an underlying lesion. These results were called by telephone at the time of interpretation on 01/24/2023 at 1:03 am to provider Dr. Pilar Plate, who verbally acknowledged these results. Electronically Signed   By: Tish Frederickson M.D.   On: 01/24/2023 01:18   Result Date: 01/24/2023 CLINICAL DATA:  RUQ PAIN, HYDROGEN PEROXIDE INGESTION EXAM: CT ABDOMEN AND PELVIS WITH CONTRAST TECHNIQUE: Multidetector CT imaging of the abdomen and pelvis was performed using the standard protocol following bolus administration of intravenous contrast. RADIATION DOSE REDUCTION: This exam was performed according to the departmental dose-optimization program which includes automated exposure control, adjustment of the mA and/or kV according to patient size and/or use of iterative reconstruction technique. CONTRAST:  60mL OMNIPAQUE IOHEXOL 350 MG/ML SOLN COMPARISON:  None Available. FINDINGS: Lower chest: Prominent heart size.  No acute abnormality. Hepatobiliary: No focal liver abnormality. Calcified gallstone noted within the gallbladder lumen. No gallbladder wall thickening or pericholecystic fluid. No biliary dilatation. Pancreas: No focal lesion. Normal pancreatic contour. No surrounding inflammatory changes. No main pancreatic ductal dilatation. Spleen: Normal in size without focal abnormality. Adrenals/Urinary Tract: No adrenal nodule bilaterally. Bilateral atrophic kidneys. Bilateral kidneys enhance symmetrically. No  hydronephrosis. No hydroureter. Limited evaluation of the urinary bladder lumen due to streak artifact originating from bilateral femoral surgical hardware. The urinary bladder is unremarkable. Stomach/Bowel: Stomach is within normal limits. No evidence of small bowel wall thickening or dilatation. Mid to distal sigmoid bowel wall thickening with associated mild pericolonic fat stranding and free foci of gas scattered along the sigmoid colon, pelvis and mid abdomen. No intramural abscess formation. Circumferential bowel thickening of the rectum. Limited evaluation of the rectum due to streak artifact originating from bilateral femoral surgical hardware.Appendix appears normal. Vascular/Lymphatic: No abdominal aorta or iliac aneurysm. Mild atherosclerotic plaque of the aorta and its branches. No abdominal, pelvic, or inguinal lymphadenopathy. Reproductive: Limited evaluation due to streak artifact originating from bilateral femoral surgical hardware. Other: No intraperitoneal free fluid. Abdomen and pelvis positive for free intraperitoneal gas grossly scattered throughout the mesentery of the sigmoid colon but also noted within the mid abdomen and pelvis. No organized fluid collection. Musculoskeletal: No abdominal wall hernia or abnormality. No suspicious lytic or blastic osseous lesions. No acute displaced fracture. Multilevel severe degenerative changes of the spine. Bilateral total hip arthroplasty. IMPRESSION: 1. Colonic diverticulosis with acute perforated mid to distal sigmoid colon diverticulitis. No organized fluid collection. Recommend colonoscopy status post treatment and status post complete resolution of inflammatory changes to exclude an underlying lesion. 2. Circumferential bowel wall thickening of the rectum which may represent reactive changes versus proctitis. Limited evaluation of the rectum due to streak artifact originating from bilateral femoral surgical hardware. 3. Cholelithiasis with no CT  finding of acute cholecystitis. 4. Atrophic kidneys. 5.  Aortic Atherosclerosis (ICD10-I70.0). Electronically Signed: By: Tish Frederickson M.D. On: 01/24/2023 00:59   CT HEAD WO CONTRAST ( )  Result Date: 01/24/2023 CLINICAL DATA:  Delirium EXAM: CT HEAD WITHOUT CONTRAST TECHNIQUE: Contiguous axial images were obtained from the base of the skull through the vertex without intravenous contrast. RADIATION DOSE REDUCTION: This exam was performed according to the departmental dose-optimization program which includes automated exposure control, adjustment of the mA and/or kV according to patient size and/or use of iterative reconstruction technique. COMPARISON:  CT head 04/20/2013, MRI head 09/11/2011 FINDINGS: Brain: Patchy and confluent areas of decreased attenuation are noted throughout the deep and periventricular white matter of the cerebral hemispheres bilaterally, compatible with chronic microvascular ischemic disease. No evidence of large-territorial acute infarction. No parenchymal hemorrhage. No mass lesion. No extra-axial collection. No mass effect or midline shift. No hydrocephalus. Basilar cisterns are patent. Vascular: No hyperdense vessel. Skull: No acute fracture or focal lesion. Sinuses/Orbits: Bilateral maxillary sinus mucosal thickening. Otherwise paranasal sinuses and mastoid air cells are clear. 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 The right orbit is unremarkable. Other: None. IMPRESSION: No acute intracranial abnormality. Electronically Signed   By: Tish Frederickson M.D.   On: 01/24/2023 01:01   DG Chest Portable 1 View  Result Date: 01/23/2023 CLINICAL DATA:  Ingestion. EXAM: PORTABLE CHEST 1 VIEW COMPARISON:  Chest x-ray 05/11/2022 FINDINGS: Heart is enlarged, unchanged. There central pulmonary vascular congestion. There is no radiopaque foreign body. The lungs and costophrenic angles are clear. There is no pneumothorax or acute fracture. IMPRESSION: 1. Cardiomegaly  with central pulmonary vascular  congestion. 2. No radiopaque foreign body. Electronically Signed   By: Darliss Cheney M.D.   On: 01/23/2023 23:45        Scheduled Meds:  heparin  5,000 Units Subcutaneous Q8H   hydrALAZINE  10 mg Intravenous Q6H   sodium chloride flush  3 mL Intravenous Q12H   Continuous Infusions:  sodium chloride     piperacillin-tazobactam (ZOSYN)  IV Stopped (01/24/23 0701)     LOS: 0 days    Time spent: 35 minutes    Dauntae Derusha A Evian Salguero, MD [facility]   If 7PM-7AM, please contact night-coverage [email or web address]  01/24/2023, 7:48 AM

## 2023-01-24 NOTE — Progress Notes (Signed)
ED Pharmacy Antibiotic Sign Off An antibiotic consult was received from an ED provider for zosyn per pharmacy dosing for perforated diverticulitis. Noted childhood reaction to penicillin in chart. Discussed with patient and did not require hospitalization or experience breathing issues. A chart review was completed to assess appropriateness.   The following one time order(s) were placed:  Zosyn 3.375g x 1 in ED over 30 minutes   Further antibiotic and/or antibiotic pharmacy consults should be ordered by the admitting provider if indicated.   Thank you for allowing pharmacy to be a part of this patient's care.   Marja Kays, [facility]  Clinical Pharmacist 01/24/23 1:33 AM

## 2023-01-24 NOTE — Procedures (Signed)
I was present at this dialysis session, have reviewed the session and made  appropriate changes Vinson Moselle MD  [facility] 01/24/2023, 12:19 PM

## 2023-01-24 NOTE — Progress Notes (Signed)
   01/24/23 1431  Vitals  Temp 97.8 F (36.6 C)  Pulse Rate 98  Resp 16  BP 133/81  SpO2 96 %  O2 Device Room Air  Weight 74.6 kg  Type of Weight Post-Dialysis  Oxygen Therapy  Patient Activity (if Appropriate) In bed  Pulse Oximetry Type Continuous  Oximetry Probe Site Changed No  Post Treatment  Dialyzer Clearance Lightly streaked  Hemodialysis Intake (mL) 0 mL  Liters Processed 84  Fluid Removed (mL) 3.3 mL  Tolerated HD Treatment Yes  AVG/AVF Arterial Site Held (minutes) 10 minutes  AVG/AVF Venous Site Held (minutes) 10 minutes   Received patient in bed to unit.  Alert and oriented.  Informed consent signed and in chart.   TX duration:3.5  Patient tolerated well.  Transported back to the room  Alert, without acute distress.  Hand-off given to patient's nurse.   Access used: RUAF Access issues: no complications  Total UF removed: 1000 Medication(s) given: none    [facility]

## 2023-01-24 NOTE — ED Notes (Signed)
Lab called a critical potassium 6.6 Dr Sunnie Nielsen was made aware.

## 2023-01-24 NOTE — Care Management CC44 (Signed)
Condition Code 44 Documentation Completed  Patient Details  Name: JACIEL ZERGER MRN: [id number] Date of Birth: Jun 15, 1965   Condition Code 44 given:  Yes Patient signature on Condition Code 44 notice:  Yes Documentation of 2 MD's agreement:  Yes Code 44 added to claim:  Yes    Kingsley Plan, RN 01/24/2023, 3:54 PM

## 2023-01-24 NOTE — ED Notes (Signed)
ED TO INPATIENT HANDOFF REPORT  ED Nurse Name and Phone #: [phone]  S Name/Age/Gender Paul [name] 57 y.o. male Room/Bed: 041C/041C  Code Status   Code Status: Full Code  Home/SNF/Other Home Patient oriented to: self, place, time, and situation Is this baseline? Yes   Triage Complete: Triage complete  Chief Complaint Perforated diverticulum [K57.80] Ingestion of caustic substance [T54.91XA]  Triage Note Patient comes from home. He has missed dialysis because he does not want to go.  He is hypertensive with EMS at 230 systolic.   He has been drinking hydrogen peroxide and gave himself an enema with it because he feels "compacted". He has been having loose stools since.  He is requesting a GI consult.    Allergies Allergies  Allergen Reactions   Penicillins Other (See Comments)    Unknown childhood reaction    Level of Care/Admitting Diagnosis ED Disposition     ED Disposition  Admit   Condition  --   Comment  Hospital Area: [facility] [[id number]]  Level of Care: Telemetry Medical [104]  May admit patient to [facility] or [facility] if equivalent level of care is available:: No  Covid Evaluation: Asymptomatic - no recent exposure (last 10 days) testing not required  Diagnosis: Ingestion of caustic substance [720066]  Admitting Physician: Gery Pray [[id number]]  Attending Physician: Gery Pray [[id number]]  Certification:: I certify this patient will need inpatient services for at least 2 midnights  Expected Medical Readiness: 01/26/2023          B Medical/Surgery History Past Medical History:  Diagnosis Date   Bipolar disorder (HCC)    Central retinal artery occlusion    with macular infarction of the right eye. status post posterior vitrectomy and photocoagulation with insertion of a shunt in 2006.   CHF (congestive heart failure) (HCC)    chronic mixed syst/diast. 02/2009 echo: Systolic function was mildly reduced. The  estimated ejection fraction was in 45%, global HK, Grade I Diast dysfxn.   Chronic kidney disease    ESRD   Heart failure    Obesity    Osteoarthritis    s/p right hip replacement in 2001   Schizophrenia (HCC)    Severe uncontrolled hypertension    Past Surgical History:  Procedure Laterality Date   AV FISTULA PLACEMENT Right 07/08/2022   Procedure: RIGHT RADIOCEPHALIC ARTERIOVENOUS (AV) FISTULA CREATION;  Surgeon: Victorino Sparrow, MD;  Location: [facility] OR;  Service: Vascular;  Laterality: Right;   IR FLUORO GUIDE CV LINE RIGHT  05/13/2022   IR US GUIDE VASC ACCESS RIGHT  05/13/2022   JOINT REPLACEMENT     right hip replacement   right eye surgery     for glaucoma   TOTAL HIP ARTHROPLASTY Bilateral 1997, 2001     A IV Location/Drains/Wounds Patient Lines/Drains/Airways Status     Active Line/Drains/Airways     Name Placement date Placement time Site Days   Peripheral IV 01/23/23 20 G Anterior;Distal;Left;Upper Arm 01/23/23  2331  Arm  1   Fistula / Graft Right Other (Comment) Arteriovenous fistula 07/08/22  0828  Other (Comment)  200   Hemodialysis Catheter Right Internal jugular Double lumen Permanent (Tunneled) 05/13/22  0908  Internal jugular  256   Wound 09/10/11 Skin tear Leg Right;Medial;Anterior skin tear 09/10/11  2000  Leg  4154            Intake/Output Last 24 hours No intake or output data in the 24 hours ending 01/24/23 1025  Labs/Imaging Results for orders placed or performed during the hospital encounter of 01/23/23 (from the past 48 hour(s))  CBC with Differential     Status: Abnormal   Collection Time: 01/23/23 11:34 PM  Result Value Ref Range   WBC 14.8 (H) 4.0 - 10.5 K/uL   RBC 3.43 (L) 4.22 - 5.81 MIL/uL   Hemoglobin 10.5 (L) 13.0 - 17.0 g/dL   HCT 16.1 (L) 09.6 - 04.5 %   MCV 90.1 80.0 - 100.0 fL   MCH 30.6 26.0 - 34.0 pg   MCHC 34.0 30.0 - 36.0 g/dL   RDW 40.9 81.1 - 91.4 %   Platelets 247 150 - 400 K/uL   nRBC 0.0 0.0 - 0.2 %   Neutrophils  Relative % 89 %   Neutro Abs 13.2 (H) 1.7 - 7.7 K/uL   Lymphocytes Relative 5 %   Lymphs Abs 0.7 0.7 - 4.0 K/uL   Monocytes Relative 4 %   Monocytes Absolute 0.6 0.1 - 1.0 K/uL   Eosinophils Relative 0 %   Eosinophils Absolute 0.0 0.0 - 0.5 K/uL   Basophils Relative 0 %   Basophils Absolute 0.0 0.0 - 0.1 K/uL   Immature Granulocytes 2 %   Abs Immature Granulocytes 0.26 (H) 0.00 - 0.07 K/uL    Comment: Performed at [facility], [address] [place], [place] [address]  Comprehensive metabolic panel     Status: Abnormal   Collection Time: 01/23/23 11:34 PM  Result Value Ref Range   Sodium 137 135 - 145 mmol/L   Potassium 5.8 (H) 3.5 - 5.1 mmol/L   Chloride 98 98 - 111 mmol/L   CO2 17 (L) 22 - 32 mmol/L   Glucose, Bld 108 (H) 70 - 99 mg/dL    Comment: Glucose reference range applies only to samples taken after fasting for at least 8 hours.   BUN 90 (H) 6 - 20 mg/dL   Creatinine, Ser 62.13 (H) 0.61 - 1.24 mg/dL   Calcium 8.3 (L) 8.9 - 10.3 mg/dL   Total Protein 8.0 6.5 - 8.1 g/dL   Albumin 4.3 3.5 - 5.0 g/dL   AST 13 (L) 15 - 41 U/L   ALT 19 0 - 44 U/L   Alkaline Phosphatase 54 38 - 126 U/L   Total Bilirubin 1.2 0.3 - 1.2 mg/dL   GFR, Estimated 5 (L) >60 mL/min    Comment: (NOTE) Calculated using the CKD-EPI Creatinine Equation (2021)    Anion gap 22 (H) 5 - 15    Comment: ELECTROLYTES REPEATED TO VERIFY Performed at [facility], [address], [place], [place] [address]   Ethanol     Status: None   Collection Time: 01/23/23 11:34 PM  Result Value Ref Range   Alcohol, Ethyl (B) <10 <10 mg/dL    Comment: (NOTE) Lowest detectable limit for serum alcohol is 10 mg/dL.  For medical purposes only. Performed at [facility], [address] [place], [place] [address]   Protime-INR     Status: None   Collection Time: 01/23/23 11:34 PM  Result Value Ref Range   Prothrombin Time 13.9 11.4 - 15.2 seconds   INR 1.1 0.8 - 1.2    Comment: (NOTE) INR goal  varies based on device and disease states. Performed at [facility], [address] [place], [place] [address]   Rapid urine drug screen (hospital performed)     Status: None   Collection Time: 01/23/23 11:34 PM  Result Value Ref Range   Opiates NONE DETECTED NONE DETECTED   Cocaine NONE DETECTED NONE DETECTED   Benzodiazepines NONE DETECTED NONE DETECTED   Amphetamines NONE DETECTED NONE DETECTED   Tetrahydrocannabinol NONE DETECTED NONE DETECTED   Barbiturates NONE DETECTED NONE DETECTED    Comment: (NOTE) DRUG SCREEN FOR MEDICAL PURPOSES ONLY.  IF CONFIRMATION IS NEEDED FOR ANY PURPOSE, NOTIFY LAB WITHIN 5 DAYS.  LOWEST DETECTABLE LIMITS FOR URINE DRUG SCREEN Drug Class                     Cutoff (ng/mL) Amphetamine and metabolites    1000 Barbiturate and metabolites    200 Benzodiazepine                 200 Opiates and metabolites        300 Cocaine and metabolites        300 THC                            50 Performed at [facility], [address], [place], [place] [address]   Salicylate level     Status: Abnormal   Collection Time: 01/23/23 11:34 PM  Result Value Ref Range   Salicylate Lvl <7.0 (L) 7.0 - 30.0 mg/dL    Comment: Performed at [facility], [address], [place], [place] [address]  I-Stat venous blood gas, ED     Status: Abnormal   Collection Time: 01/23/23 11:40 PM  Result Value Ref Range   pH, Ven 7.279 7.25 - 7.43   pCO2, Ven 37.9 (L) 44 - 60 mmHg   pO2, Ven 47 (H) 32 - 45 mmHg   Bicarbonate 17.8 (L) 20.0 - 28.0 mmol/L   TCO2 19 (L) 22 - 32 mmol/L   O2 Saturation 78 %   Acid-base deficit 8.0 (H) 0.0 - 2.0 mmol/L   Sodium 135 135 - 145 mmol/L   Potassium 5.7 (H) 3.5 - 5.1 mmol/L   Calcium, Ion 0.99 (L) 1.15 - 1.40 mmol/L   HCT 33.0 (L) 39.0 - 52.0 %   Hemoglobin 11.2 (L) 13.0 - 17.0 g/dL   Sample type VENOUS   I-stat chem 8, ED (not at [facility], [facility] or [facility])     Status: Abnormal   Collection Time: 01/23/23 11:40 PM  Result  Value Ref Range   Sodium 135 135 - 145 mmol/L   Potassium 5.7 (H) 3.5 - 5.1 mmol/L   Chloride 104 98 - 111 mmol/L   BUN 101 (H) 6 - 20 mg/dL   Creatinine, Ser 65.78 (H) 0.61 - 1.24 mg/dL   Glucose, Bld 469 (H) 70 - 99 mg/dL    Comment: Glucose reference range applies only to samples taken after fasting for at least 8 hours.   Calcium, Ion 0.99 (L) 1.15 - 1.40 mmol/L   TCO2 18 (L) 22 - 32 mmol/L   Hemoglobin 11.6 (L) 13.0 - 17.0 g/dL   HCT 62.9 (L) 52.8 - 41.3 %  Lactic acid, plasma     Status: None   Collection Time: 01/24/23  1:49 AM  Result Value Ref Range   Lactic Acid, Venous 1.0 0.5 - 1.9 mmol/L    Comment: Performed at [facility], [address] [place], [place] [address]  Lactic acid, plasma     Status: None   Collection Time: 01/24/23  7:13 AM  Result Value Ref Range   Lactic  Acid, Venous 1.8 0.5 - 1.9 mmol/L    Comment: Performed at [facility], [address], [place], [place] [address]  Comprehensive metabolic panel     Status: Abnormal   Collection Time: 01/24/23  7:13 AM  Result Value Ref Range   Sodium 136 135 - 145 mmol/L   Potassium 6.6 (HH) 3.5 - 5.1 mmol/L    Comment: CRITICAL RESULT CALLED TO, READ BACK BY AND VERIFIED WITH Franke Menter RN.@[email or web address]  ON 9.20.24 BY TCALDWELL MT.   Chloride 98 98 - 111 mmol/L   CO2 16 (L) 22 - 32 mmol/L   Glucose, Bld 158 (H) 70 - 99 mg/dL    Comment: Glucose reference range applies only to samples taken after fasting for at least 8 hours.   BUN 96 (H) 6 - 20 mg/dL   Creatinine, Ser 60.45 (H) 0.61 - 1.24 mg/dL   Calcium 8.0 (L) 8.9 - 10.3 mg/dL   Total Protein 7.5 6.5 - 8.1 g/dL   Albumin 3.8 3.5 - 5.0 g/dL   AST 12 (L) 15 - 41 U/L   ALT 16 0 - 44 U/L   Alkaline Phosphatase 48 38 - 126 U/L   Total Bilirubin 1.6 (H) 0.3 - 1.2 mg/dL   GFR, Estimated 4 (L) >60 mL/min    Comment: (NOTE) Calculated using the CKD-EPI Creatinine Equation (2021)    Anion gap 22 (H) 5 - 15    Comment: ELECTROLYTES REPEATED TO  VERIFY Performed at [facility], [address] [place], [place] [address]   CBC with Differential/Platelet     Status: Abnormal   Collection Time: 01/24/23  7:13 AM  Result Value Ref Range   WBC 16.6 (H) 4.0 - 10.5 K/uL   RBC 3.47 (L) 4.22 - 5.81 MIL/uL   Hemoglobin 11.1 (L) 13.0 - 17.0 g/dL   HCT 19.1 (L) 47.8 - 29.5 %   MCV 89.3 80.0 - 100.0 fL   MCH 32.0 26.0 - 34.0 pg   MCHC 35.8 30.0 - 36.0 g/dL   RDW 62.1 30.8 - 65.7 %   Platelets 205 150 - 400 K/uL   nRBC 0.0 0.0 - 0.2 %   Neutrophils Relative % 91 %   Neutro Abs 15.2 (H) 1.7 - 7.7 K/uL   Lymphocytes Relative 4 %   Lymphs Abs 0.7 0.7 - 4.0 K/uL   Monocytes Relative 4 %   Monocytes Absolute 0.6 0.1 - 1.0 K/uL   Eosinophils Relative 0 %   Eosinophils Absolute 0.0 0.0 - 0.5 K/uL   Basophils Relative 0 %   Basophils Absolute 0.0 0.0 - 0.1 K/uL   Immature Granulocytes 1 %   Abs Immature Granulocytes 0.11 (H) 0.00 - 0.07 K/uL    Comment: Performed at [facility], [address] [place], [place] [address]   CT ABDOMEN PELVIS W CONTRAST  Addendum Date: 01/24/2023   ADDENDUM REPORT: 01/24/2023 01:18 ADDENDUM: Given distribution of the previously mentioned "free intraperitoneal gas" along the sigmoid colon and its mesentery, mesenteric venous gas is more likely in the setting of given history: hydrogen peroxide ingestion PO and rectal. Perforated acute diverticulitis is not excluded but less likely (7:65.) Concern for sigmoiditis and proctitis with mesenteric venous gas related to hydrogen peroxide ingestion/rectal administration. No pneumatosis identified. No free intraperitoneal fluid. No portal venous gas identified. Recommend colonoscopy status post treatment and status post complete resolution of inflammatory changes to exclude an underlying lesion. These results were called by telephone at the time  of interpretation on 01/24/2023 at 1:03 am to provider Dr. Pilar Plate, who verbally acknowledged these results.  Electronically Signed   By: Tish Frederickson M.D.   On: 01/24/2023 01:18   Result Date: 01/24/2023 CLINICAL DATA:  RUQ PAIN, HYDROGEN PEROXIDE INGESTION EXAM: CT ABDOMEN AND PELVIS WITH CONTRAST TECHNIQUE: Multidetector CT imaging of the abdomen and pelvis was performed using the standard protocol following bolus administration of intravenous contrast. RADIATION DOSE REDUCTION: This exam was performed according to the departmental dose-optimization program which includes automated exposure control, adjustment of the mA and/or kV according to patient size and/or use of iterative reconstruction technique. CONTRAST:  60mL OMNIPAQUE IOHEXOL 350 MG/ML SOLN COMPARISON:  None Available. FINDINGS: Lower chest: Prominent heart size.  No acute abnormality. Hepatobiliary: No focal liver abnormality. Calcified gallstone noted within the gallbladder lumen. No gallbladder wall thickening or pericholecystic fluid. No biliary dilatation. Pancreas: No focal lesion. Normal pancreatic contour. No surrounding inflammatory changes. No main pancreatic ductal dilatation. Spleen: Normal in size without focal abnormality. Adrenals/Urinary Tract: No adrenal nodule bilaterally. Bilateral atrophic kidneys. Bilateral kidneys enhance symmetrically. No hydronephrosis. No hydroureter. Limited evaluation of the urinary bladder lumen due to streak artifact originating from bilateral femoral surgical hardware. The urinary bladder is unremarkable. Stomach/Bowel: Stomach is within normal limits. No evidence of small bowel wall thickening or dilatation. Mid to distal sigmoid bowel wall thickening with associated mild pericolonic fat stranding and free foci of gas scattered along the sigmoid colon, pelvis and mid abdomen. No intramural abscess formation. Circumferential bowel thickening of the rectum. Limited evaluation of the rectum due to streak artifact originating from bilateral femoral surgical hardware.Appendix appears normal. Vascular/Lymphatic:  No abdominal aorta or iliac aneurysm. Mild atherosclerotic plaque of the aorta and its branches. No abdominal, pelvic, or inguinal lymphadenopathy. Reproductive: Limited evaluation due to streak artifact originating from bilateral femoral surgical hardware. Other: No intraperitoneal free fluid. Abdomen and pelvis positive for free intraperitoneal gas grossly scattered throughout the mesentery of the sigmoid colon but also noted within the mid abdomen and pelvis. No organized fluid collection. Musculoskeletal: No abdominal wall hernia or abnormality. No suspicious lytic or blastic osseous lesions. No acute displaced fracture. Multilevel severe degenerative changes of the spine. Bilateral total hip arthroplasty. IMPRESSION: 1. Colonic diverticulosis with acute perforated mid to distal sigmoid colon diverticulitis. No organized fluid collection. Recommend colonoscopy status post treatment and status post complete resolution of inflammatory changes to exclude an underlying lesion. 2. Circumferential bowel wall thickening of the rectum which may represent reactive changes versus proctitis. Limited evaluation of the rectum due to streak artifact originating from bilateral femoral surgical hardware. 3. Cholelithiasis with no CT finding of acute cholecystitis. 4. Atrophic kidneys. 5.  Aortic Atherosclerosis (ICD10-I70.0). Electronically Signed: By: Tish Frederickson M.D. On: 01/24/2023 00:59   CT HEAD WO CONTRAST ( )  Result Date: 01/24/2023 CLINICAL DATA:  Delirium EXAM: CT HEAD WITHOUT CONTRAST TECHNIQUE: Contiguous axial images were obtained from the base of the skull through the vertex without intravenous contrast. RADIATION DOSE REDUCTION: This exam was performed according to the departmental dose-optimization program which includes automated exposure control, adjustment of the mA and/or kV according to patient size and/or use of iterative reconstruction technique. COMPARISON:  CT head 04/20/2013, MRI head  09/11/2011 FINDINGS: Brain: Patchy and confluent areas of decreased attenuation are noted throughout the deep and periventricular white matter of the cerebral hemispheres bilaterally, compatible with chronic microvascular ischemic disease. No evidence of large-territorial acute infarction. No parenchymal hemorrhage. No mass lesion. No extra-axial collection. No mass  effect or midline shift. No hydrocephalus. Basilar cisterns are patent. Vascular: No hyperdense vessel. Skull: No acute fracture or focal lesion. Sinuses/Orbits: Bilateral maxillary sinus mucosal thickening. Otherwise paranasal sinuses and mastoid air cells are clear. 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 The right orbit is unremarkable. Other: None. IMPRESSION: No acute intracranial abnormality. Electronically Signed   By: Tish Frederickson M.D.   On: 01/24/2023 01:01   DG Chest Portable 1 View  Result Date: 01/23/2023 CLINICAL DATA:  Ingestion. EXAM: PORTABLE CHEST 1 VIEW COMPARISON:  Chest x-ray 05/11/2022 FINDINGS: Heart is enlarged, unchanged. There central pulmonary vascular congestion. There is no radiopaque foreign body. The lungs and costophrenic angles are clear. There is no pneumothorax or acute fracture. IMPRESSION: 1. Cardiomegaly with central pulmonary vascular congestion. 2. No radiopaque foreign body. Electronically Signed   By: Darliss Cheney M.D.   On: 01/23/2023 23:45    Pending Labs Unresulted Labs (From admission, onward)     Start     Ordered   01/25/23 0500  Comprehensive metabolic panel  Tomorrow morning,   R        01/24/23 0520   01/25/23 0500  Magnesium  Tomorrow morning,   R        01/24/23 0520   01/25/23 0500  CBC with Differential/Platelet  Tomorrow morning,   R        01/24/23 0520   01/24/23 0839  Hepatitis B surface antigen  (New Admission Hemo Labs (Hepatitis B))  Once,   R        01/24/23 0841   01/24/23 0839  Hepatitis B surface antibody,quantitative  (New Admission Hemo Labs (Hepatitis B))  Once,   R         01/24/23 0841   01/24/23 0824  Potassium  STAT Now then every 4 hours ,   R (with STAT occurrences)     Comments: Until normal twice.    01/24/23 0824            Vitals/Pain Today's Vitals   01/24/23 0725 01/24/23 0800 01/24/23 0815 01/24/23 1024  BP: (!) 155/95 (!) 154/96 (!) 151/89   Pulse: 95 93 90   Resp: 17     Temp: 98.4 F (36.9 C)   98 F (36.7 C)  TempSrc: Oral   Oral  SpO2: 100% 100% 100%     Isolation Precautions No active isolations  Medications Medications  heparin injection 5,000 Units (5,000 Units Subcutaneous Given 01/24/23 0622)  acetaminophen (TYLENOL) tablet 650 mg (has no administration in time range)    Or  acetaminophen (TYLENOL) suppository 650 mg (has no administration in time range)  ondansetron (ZOFRAN) tablet 4 mg (has no administration in time range)    Or  ondansetron (ZOFRAN) injection 4 mg (has no administration in time range)  metoprolol tartrate (LOPRESSOR) injection 10 mg (has no administration in time range)  piperacillin-tazobactam (ZOSYN) IVPB 2.25 g (0 g Intravenous Stopped 01/24/23 0701)  hydrALAZINE (APRESOLINE) injection 10 mg (10 mg Intravenous Given 01/24/23 0622)  pantoprazole (PROTONIX) injection 40 mg (40 mg Intravenous Given 01/24/23 1019)  Chlorhexidine Gluconate Cloth 2 % PADS 6 each (has no administration in time range)  pentafluoroprop-tetrafluoroeth (GEBAUERS) aerosol 1 Application (has no administration in time range)  lidocaine (PF) (XYLOCAINE) 1 % injection 5 mL (has no administration in time range)  lidocaine-prilocaine (EMLA) cream 1 Application (has no administration in time range)  feeding supplement (NEPRO CARB STEADY) liquid 237 mL (has no administration in time range)  heparin injection 1,000 Units (  has no administration in time range)  anticoagulant sodium citrate solution 5 mL (has no administration in time range)  alteplase (CATHFLO ACTIVASE) injection 2 mg (has no administration in time range)  heparin  injection 2,400 Units (has no administration in time range)  iohexol (OMNIPAQUE) 350 MG/ML injection 60 mL (60 mLs Intravenous Contrast Given 01/24/23 0030)  piperacillin-tazobactam (ZOSYN) IVPB 3.375 g (0 g Intravenous Stopped 01/24/23 0236)  sodium bicarbonate injection 50 mEq (50 mEq Intravenous Given 01/24/23 0200)  sodium bicarbonate injection 50 mEq (50 mEq Intravenous Given 01/24/23 0857)  insulin aspart (novoLOG) injection 5 Units (5 Units Intravenous Given 01/24/23 0850)    And  dextrose 50 % solution 50 mL (50 mLs Intravenous Given 01/24/23 0900)  albuterol (PROVENTIL) (2.5 MG/3ML) 0.083% nebulizer solution 10 mg (10 mg Nebulization Given 01/24/23 0907)  calcium gluconate 1 g/ 50 mL sodium chloride IVPB (0 mg Intravenous Stopped 01/24/23 0926)    Mobility walks     Focused Assessments Abdominal pain, hypertension   R Recommendations: See Admitting Provider Note  Report given to:   Additional Notes: Patient had a critical  potassium was 6.6 he received sodium bicarb, d50, calcium gluconate, insulin 5 iv and albuterol nebs. He is on his way to hemodialysis, and they are aware he as a room upstairs.

## 2023-01-25 DIAGNOSIS — I5023 Acute on chronic systolic (congestive) heart failure: Secondary | ICD-10-CM | POA: Diagnosis not present

## 2023-01-25 DIAGNOSIS — R109 Unspecified abdominal pain: Secondary | ICD-10-CM | POA: Diagnosis not present

## 2023-01-25 DIAGNOSIS — T490X5A Adverse effect of local antifungal, anti-infective and anti-inflammatory drugs, initial encounter: Secondary | ICD-10-CM | POA: Diagnosis not present

## 2023-01-25 DIAGNOSIS — N186 End stage renal disease: Secondary | ICD-10-CM | POA: Diagnosis not present

## 2023-01-25 LAB — CBC WITH DIFFERENTIAL/PLATELET
Abs Immature Granulocytes: 0.06 10*3/uL (ref 0.00–0.07)
Basophils Absolute: 0 10*3/uL (ref 0.0–0.1)
Basophils Relative: 0 %
Eosinophils Absolute: 0.1 10*3/uL (ref 0.0–0.5)
Eosinophils Relative: 1 %
HCT: 29.9 % — ABNORMAL LOW (ref 39.0–52.0)
Hemoglobin: 10.7 g/dL — ABNORMAL LOW (ref 13.0–17.0)
Immature Granulocytes: 0 %
Lymphocytes Relative: 6 %
Lymphs Abs: 0.9 10*3/uL (ref 0.7–4.0)
MCH: 32.5 pg (ref 26.0–34.0)
MCHC: 35.8 g/dL (ref 30.0–36.0)
MCV: 90.9 fL (ref 80.0–100.0)
Monocytes Absolute: 1.2 10*3/uL — ABNORMAL HIGH (ref 0.1–1.0)
Monocytes Relative: 8 %
Neutro Abs: 12.1 10*3/uL — ABNORMAL HIGH (ref 1.7–7.7)
Neutrophils Relative %: 85 %
Platelets: 205 10*3/uL (ref 150–400)
RBC: 3.29 MIL/uL — ABNORMAL LOW (ref 4.22–5.81)
RDW: 15 % (ref 11.5–15.5)
WBC: 14.4 10*3/uL — ABNORMAL HIGH (ref 4.0–10.5)
nRBC: 0 % (ref 0.0–0.2)

## 2023-01-25 LAB — COMPREHENSIVE METABOLIC PANEL
ALT: 14 U/L (ref 0–44)
AST: 12 U/L — ABNORMAL LOW (ref 15–41)
Albumin: 3.6 g/dL (ref 3.5–5.0)
Alkaline Phosphatase: 57 U/L (ref 38–126)
Anion gap: 15 (ref 5–15)
BUN: 38 mg/dL — ABNORMAL HIGH (ref 6–20)
CO2: 23 mmol/L (ref 22–32)
Calcium: 8.2 mg/dL — ABNORMAL LOW (ref 8.9–10.3)
Chloride: 97 mmol/L — ABNORMAL LOW (ref 98–111)
Creatinine, Ser: 7.65 mg/dL — ABNORMAL HIGH (ref 0.61–1.24)
GFR, Estimated: 8 mL/min — ABNORMAL LOW (ref >60)
Glucose, Bld: 88 mg/dL (ref 70–99)
Potassium: 3.9 mmol/L (ref 3.5–5.1)
Sodium: 135 mmol/L (ref 135–145)
Total Bilirubin: 1.7 mg/dL — ABNORMAL HIGH (ref 0.3–1.2)
Total Protein: 7.2 g/dL (ref 6.5–8.1)

## 2023-01-25 LAB — PHOSPHORUS: Phosphorus: 5.8 mg/dL — ABNORMAL HIGH (ref 2.5–4.6)

## 2023-01-25 LAB — GLUCOSE, CAPILLARY
Glucose-Capillary: 106 mg/dL — ABNORMAL HIGH (ref 70–99)
Glucose-Capillary: 109 mg/dL — ABNORMAL HIGH (ref 70–99)
Glucose-Capillary: 120 mg/dL — ABNORMAL HIGH (ref 70–99)
Glucose-Capillary: 122 mg/dL — ABNORMAL HIGH (ref 70–99)
Glucose-Capillary: 91 mg/dL (ref 70–99)
Glucose-Capillary: 99 mg/dL (ref 70–99)

## 2023-01-25 LAB — HEPATITIS B SURFACE ANTIBODY, QUANTITATIVE: Hep B S AB Quant (Post): 56703 m[IU]/mL

## 2023-01-25 LAB — MAGNESIUM: Magnesium: 1.8 mg/dL (ref 1.7–2.4)

## 2023-01-25 NOTE — Progress Notes (Signed)
PROGRESS NOTE    Paul [name]  VFI:[id number] DOB: [birth year] DOA: 01/23/2023 PCP: Marcine Matar, MD   Brief Narrative: 57 year old with past medical history significant for bipolar disorder, schizophrenia, hypertension, ESRD (MWF) heart failure reduced ejection fraction presents with abdominal pain.  Patient relates that he wanted to do with cleanse.  He ingested 4 ounces of hydroxy peroxide orally and use a small amount of hydroperoxide  to make an enema to clean his colon.  After injection he developed abdominal pain, nausea and presented to the ED for further evaluation.  Evaluation in the ED he was found to be hypertensive systolic blood pressure 194, potassium 5.7, white blood cell 14, CT head normal.  CT abdomen and pelvis showed gas initially thought to be perforation however radiologist added a correction: "Free intraperitoneal gas along the sigmoid and its mesentery, mesenteric venous gas is more likely in the setting of given history of hydrogen peroxide ingestion p.o. or rectal.  Perforated acute diverticulitis is not excluded but less likely.  Concern for sigmoiditis and proctitis with mesenteric venous gas related to hydrogen peroxide injection rectal administration.  No free intraperitoneal fluid".   Assessment & Plan:   Principal Problem:   Ingestion of caustic substance Active Problems:   Essential hypertension   Schizophrenia (HCC)   Bipolar disorder (HCC)   Chronic combined systolic and diastolic CHF (congestive heart failure) (HCC)   Mixed hyperlipidemia   Hypocalcemia   Perforated diverticulum   ESRD (end stage renal disease) (HCC)   1-Caustic Ingestion hydrogen peroxide -Case was discussed with poison control, recommended monitoring for air emboli, monitor for headaches or chest pain.  Minimal in 14 hours observation. -patient denies chest pain, headaches.  -he was trying to cleanse his colon. He denies trying to harm himself.  Abdominal pain improved.    Possible sigmoiditis and proctitis with mesenteric venous gas related to hydrogen peroxide injection rectal administration Bloody stool/ less amount per patient. Monitor hb.  -Surgery Following.  -IV Zosyn.  -IV protonix.  Report improvement of abdominal pain. Started on clear diet today by sx.  WBC trending down 16--14  Bipolar, schizophrenia: Not on medications.  Answer questions appropriately.   ESRD hyperkalemia, metabolic acidosis.  K at 6.6 S/P Calcium gluconate, amp bicarb, insulin amp D 50  Nephrology consulted for HD.  Had HD 9/20. Hyperkalemia resolved.   Chronic combined systolic and diastolic heart failure Volume manage with HD.   Essential hypertension Hydralazine.   Mixed hyperlipidemia:       Estimated body mass index is 29.13 kg/m as calculated from the following:   Height as of 11/29/22: 5\' 3"  (1.6 m).   Weight as of this encounter: 74.6 kg.   DVT prophylaxis: heparin  Code Status: Full code Family Communication: Care discussed with patient.  Disposition Plan:  Status is: Inpatient Remains inpatient appropriate because: management of hyperkalemia, ingestion of caustic    Consultants:  Surgery Nephrology   Procedures:    Antimicrobials:    Subjective: Report less bloody stool  Abdominal pain improved.   Objective: Vitals:   01/24/23 2354 01/25/23 0426 01/25/23 0534 01/25/23 0735  BP: (!) 145/90 137/74 (!) 142/85 (!) 157/80  Pulse: (!) 101 96  99  Resp:  20  16  Temp:  98.6 F (37 C)  98.7 F (37.1 C)  TempSrc:  Oral  Oral  SpO2:  98%  99%  Weight:        Intake/Output Summary (Last 24 hours) at 01/25/2023 1445 Last data  filed at 01/25/2023 0650 Gross per 24 hour  Intake 139.17 ml  Output 0 ml  Net 139.17 ml   Filed Weights   01/24/23 1037 01/24/23 1431  Weight: 75.6 kg 74.6 kg    Examination:  General exam: NAD Respiratory system: CTA Cardiovascular system: S 1, S 2 RRR Gastrointestinal system: BS present,  soft, nt Central nervous system: alert Extremities: no edema  Data Reviewed: I have personally reviewed following labs and imaging studies  CBC: Recent Labs  Lab 01/23/23 2334 01/23/23 2340 01/24/23 0713 01/25/23 0252  WBC 14.8*  --  16.6* 14.4*  NEUTROABS 13.2*  --  15.2* 12.1*  HGB 10.5* 11.6*  11.2* 11.1* 10.7*  HCT 30.9* 34.0*  33.0* 31.0* 29.9*  MCV 90.1  --  89.3 90.9  PLT 247  --  205 205   Basic Metabolic Panel: Recent Labs  Lab 01/23/23 2334 01/23/23 2340 01/24/23 0713 01/24/23 1602 01/25/23 0252  NA 137 135  135 136  --  135  K 5.8* 5.7*  5.7* 6.6* 3.0* 3.9  CL 98 104 98  --  97*  CO2 17*  --  16*  --  23  GLUCOSE 108* 100* 158*  --  88  BUN 90* 101* 96*  --  38*  CREATININE 11.65* 12.70* 12.18*  --  7.65*  CALCIUM 8.3*  --  8.0*  --  8.2*  MG  --   --   --   --  1.8   GFR: Estimated Creatinine Clearance: 9.4 mL/min (A) (by C-G formula based on SCr of 7.65 mg/dL (H)). Liver Function Tests: Recent Labs  Lab 01/23/23 2334 01/24/23 0713 01/25/23 0252  AST 13* 12* 12*  ALT 19 16 14   ALKPHOS 54 48 57  BILITOT 1.2 1.6* 1.7*  PROT 8.0 7.5 7.2  ALBUMIN 4.3 3.8 3.6   No results for input(s): "LIPASE", "AMYLASE" in the last 168 hours. No results for input(s): "AMMONIA" in the last 168 hours. Coagulation Profile: Recent Labs  Lab 01/23/23 2334  INR 1.1   Cardiac Enzymes: No results for input(s): "CKTOTAL", "CKMB", "CKMBINDEX", "TROPONINI" in the last 168 hours. BNP (last 3 results) No results for input(s): "PROBNP" in the last 8760 hours. HbA1C: No results for input(s): "HGBA1C" in the last 72 hours. CBG: Recent Labs  Lab 01/24/23 2001 01/25/23 0005 01/25/23 0423 01/25/23 0808 01/25/23 1146  GLUCAP 101* 99 106* 91 122*   Lipid Profile: No results for input(s): "CHOL", "HDL", "LDLCALC", "TRIG", "CHOLHDL", "LDLDIRECT" in the last 72 hours. Thyroid Function Tests: No results for input(s): "TSH", "T4TOTAL", "FREET4", "T3FREE", "THYROIDAB"  in the last 72 hours. Anemia Panel: No results for input(s): "VITAMINB12", "FOLATE", "FERRITIN", "TIBC", "IRON", "RETICCTPCT" in the last 72 hours. Sepsis Labs: Recent Labs  Lab 01/24/23 0149 01/24/23 0713  LATICACIDVEN 1.0 1.8    No results found for this or any previous visit (from the past 240 hour(s)).       Radiology Studies: CT ABDOMEN PELVIS W CONTRAST  Addendum Date: 01/24/2023   ADDENDUM REPORT: 01/24/2023 01:18 ADDENDUM: Given distribution of the previously mentioned "free intraperitoneal gas" along the sigmoid colon and its mesentery, mesenteric venous gas is more likely in the setting of given history: hydrogen peroxide ingestion PO and rectal. Perforated acute diverticulitis is not excluded but less likely (7:65.) Concern for sigmoiditis and proctitis with mesenteric venous gas related to hydrogen peroxide ingestion/rectal administration. No pneumatosis identified. No free intraperitoneal fluid. No portal venous gas identified. Recommend colonoscopy status post treatment and  status post complete resolution of inflammatory changes to exclude an underlying lesion. These results were called by telephone at the time of interpretation on 01/24/2023 at 1:03 am to provider Dr. Pilar Plate, who verbally acknowledged these results. Electronically Signed   By: Tish Frederickson M.D.   On: 01/24/2023 01:18   Result Date: 01/24/2023 CLINICAL DATA:  RUQ PAIN, HYDROGEN PEROXIDE INGESTION EXAM: CT ABDOMEN AND PELVIS WITH CONTRAST TECHNIQUE: Multidetector CT imaging of the abdomen and pelvis was performed using the standard protocol following bolus administration of intravenous contrast. RADIATION DOSE REDUCTION: This exam was performed according to the departmental dose-optimization program which includes automated exposure control, adjustment of the mA and/or kV according to patient size and/or use of iterative reconstruction technique. CONTRAST:  60mL OMNIPAQUE IOHEXOL 350 MG/ML SOLN COMPARISON:  None  Available. FINDINGS: Lower chest: Prominent heart size.  No acute abnormality. Hepatobiliary: No focal liver abnormality. Calcified gallstone noted within the gallbladder lumen. No gallbladder wall thickening or pericholecystic fluid. No biliary dilatation. Pancreas: No focal lesion. Normal pancreatic contour. No surrounding inflammatory changes. No main pancreatic ductal dilatation. Spleen: Normal in size without focal abnormality. Adrenals/Urinary Tract: No adrenal nodule bilaterally. Bilateral atrophic kidneys. Bilateral kidneys enhance symmetrically. No hydronephrosis. No hydroureter. Limited evaluation of the urinary bladder lumen due to streak artifact originating from bilateral femoral surgical hardware. The urinary bladder is unremarkable. Stomach/Bowel: Stomach is within normal limits. No evidence of small bowel wall thickening or dilatation. Mid to distal sigmoid bowel wall thickening with associated mild pericolonic fat stranding and free foci of gas scattered along the sigmoid colon, pelvis and mid abdomen. No intramural abscess formation. Circumferential bowel thickening of the rectum. Limited evaluation of the rectum due to streak artifact originating from bilateral femoral surgical hardware.Appendix appears normal. Vascular/Lymphatic: No abdominal aorta or iliac aneurysm. Mild atherosclerotic plaque of the aorta and its branches. No abdominal, pelvic, or inguinal lymphadenopathy. Reproductive: Limited evaluation due to streak artifact originating from bilateral femoral surgical hardware. Other: No intraperitoneal free fluid. Abdomen and pelvis positive for free intraperitoneal gas grossly scattered throughout the mesentery of the sigmoid colon but also noted within the mid abdomen and pelvis. No organized fluid collection. Musculoskeletal: No abdominal wall hernia or abnormality. No suspicious lytic or blastic osseous lesions. No acute displaced fracture. Multilevel severe degenerative changes of the  spine. Bilateral total hip arthroplasty. IMPRESSION: 1. Colonic diverticulosis with acute perforated mid to distal sigmoid colon diverticulitis. No organized fluid collection. Recommend colonoscopy status post treatment and status post complete resolution of inflammatory changes to exclude an underlying lesion. 2. Circumferential bowel wall thickening of the rectum which may represent reactive changes versus proctitis. Limited evaluation of the rectum due to streak artifact originating from bilateral femoral surgical hardware. 3. Cholelithiasis with no CT finding of acute cholecystitis. 4. Atrophic kidneys. 5.  Aortic Atherosclerosis (ICD10-I70.0). Electronically Signed: By: Tish Frederickson M.D. On: 01/24/2023 00:59   CT HEAD WO CONTRAST ( )  Result Date: 01/24/2023 CLINICAL DATA:  Delirium EXAM: CT HEAD WITHOUT CONTRAST TECHNIQUE: Contiguous axial images were obtained from the base of the skull through the vertex without intravenous contrast. RADIATION DOSE REDUCTION: This exam was performed according to the departmental dose-optimization program which includes automated exposure control, adjustment of the mA and/or kV according to patient size and/or use of iterative reconstruction technique. COMPARISON:  CT head 04/20/2013, MRI head 09/11/2011 FINDINGS: Brain: Patchy and confluent areas of decreased attenuation are noted throughout the deep and periventricular white matter of the cerebral hemispheres bilaterally, compatible with chronic  microvascular ischemic disease. No evidence of large-territorial acute infarction. No parenchymal hemorrhage. No mass lesion. No extra-axial collection. No mass effect or midline shift. No hydrocephalus. Basilar cisterns are patent. Vascular: No hyperdense vessel. Skull: No acute fracture or focal lesion. Sinuses/Orbits: Bilateral maxillary sinus mucosal thickening. Otherwise paranasal sinuses and mastoid air cells are clear. 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 The right orbit is  unremarkable. Other: None. IMPRESSION: No acute intracranial abnormality. Electronically Signed   By: Tish Frederickson M.D.   On: 01/24/2023 01:01   DG Chest Portable 1 View  Result Date: 01/23/2023 CLINICAL DATA:  Ingestion. EXAM: PORTABLE CHEST 1 VIEW COMPARISON:  Chest x-ray 05/11/2022 FINDINGS: Heart is enlarged, unchanged. There central pulmonary vascular congestion. There is no radiopaque foreign body. The lungs and costophrenic angles are clear. There is no pneumothorax or acute fracture. IMPRESSION: 1. Cardiomegaly with central pulmonary vascular congestion. 2. No radiopaque foreign body. Electronically Signed   By: Darliss Cheney M.D.   On: 01/23/2023 23:45        Scheduled Meds:  calcitRIOL  0.25 mcg Oral Q M,W,F   Chlorhexidine Gluconate Cloth  6 each Topical Q0600   heparin  5,000 Units Subcutaneous Q8H   hydrALAZINE  10 mg Intravenous Q6H   pantoprazole (PROTONIX) IV  40 mg Intravenous Q12H   sucroferric oxyhydroxide  1,000 mg Oral TID WC   Continuous Infusions:  piperacillin-tazobactam (ZOSYN)  IV 2.25 g (01/25/23 1325)     LOS: 1 day    Time spent: 35 minutes    Taleya Whitcher A Kayliegh Boyers, MD [facility]   If 7PM-7AM, please contact night-coverage [email or web address]  01/25/2023, 2:45 PM

## 2023-01-25 NOTE — Progress Notes (Signed)
Assessment & Plan: HD#2 - ingestion and enema with hydrogen peroxide - 57 yo male with ESRD, CHF, and bipolar disease - abdominal exam remains benign, begin clear liquid diet - empiric IV abx per medical service - WBC 14.4 - gas on CT scan is likely related to the chemical administration, low risk to require surgery - serial exams, will follow with you        Paul Level, MD [facility] practice Office: [phone]        Chief Complaint: Ingestion hydrogen peroxide, enema administration  Subjective: Patient in bed, comfortable, wants to eat.  Objective: Vital signs in last 24 hours: Temp:  [97.8 F (36.6 C)-98.7 F (37.1 C)] 98.7 F (37.1 C) (09/21 0735) Pulse Rate:  [92-104] 99 (09/21 0735) Resp:  [12-20] 16 (09/21 0735) BP: (129-157)/(71-90) 157/80 (09/21 0735) SpO2:  [96 %-100 %] 99 % (09/21 0735) Weight:  [74.6 kg-75.6 kg] 74.6 kg (09/20 1431)    Intake/Output from previous day: 09/20 0701 - 09/21 0700 In: 139.2 [IV Piggyback:139.2] Out: 3.3  Intake/Output this shift: No intake/output data recorded.  Physical Exam: HEENT - sclerae clear, mucous membranes moist Neck - soft Abdomen - soft without distension; non-tender; no mass; no guarding  Lab Results:  Recent Labs    01/24/23 0713 01/25/23 0252  WBC 16.6* 14.4*  HGB 11.1* 10.7*  HCT 31.0* 29.9*  PLT 205 205   BMET Recent Labs    01/24/23 0713 01/24/23 1602 01/25/23 0252  NA 136  --  135  K 6.6* 3.0* 3.9  CL 98  --  97*  CO2 16*  --  23  GLUCOSE 158*  --  88  BUN 96*  --  38*  CREATININE 12.18*  --  7.65*  CALCIUM 8.0*  --  8.2*   PT/INR Recent Labs    01/23/23 2334  LABPROT 13.9  INR 1.1   Comprehensive Metabolic Panel:    Component Value Date/Time   NA 135 01/25/2023 0252   NA 136 01/24/2023 0713   NA 138 06/14/2021 1350   K 3.9 01/25/2023 0252   K 3.0 (L) 01/24/2023 1602   CL 97 (L) 01/25/2023 0252   CL 98 01/24/2023 0713   CO2 23  01/25/2023 0252   CO2 16 (L) 01/24/2023 0713   BUN 38 (H) 01/25/2023 0252   BUN 96 (H) 01/24/2023 0713   BUN 52 (H) 06/14/2021 1350   CREATININE 7.65 (H) 01/25/2023 0252   CREATININE 12.18 (H) 01/24/2023 0713   GLUCOSE 88 01/25/2023 0252   GLUCOSE 158 (H) 01/24/2023 0713   CALCIUM 8.2 (L) 01/25/2023 0252   CALCIUM 8.0 (L) 01/24/2023 0713   CALCIUM 5.8 (LL) 05/12/2022 0636   AST 12 (L) 01/25/2023 0252   AST 12 (L) 01/24/2023 0713   ALT 14 01/25/2023 0252   ALT 16 01/24/2023 0713   ALKPHOS 57 01/25/2023 0252   ALKPHOS 48 01/24/2023 0713   BILITOT 1.7 (H) 01/25/2023 0252   BILITOT 1.6 (H) 01/24/2023 0713   BILITOT 0.3 06/14/2021 1350   PROT 7.2 01/25/2023 0252   PROT 7.5 01/24/2023 0713   PROT 6.7 06/14/2021 1350   ALBUMIN 3.6 01/25/2023 0252   ALBUMIN 3.8 01/24/2023 0713   ALBUMIN 4.1 06/14/2021 1350    Studies/Results: CT ABDOMEN PELVIS W CONTRAST  Addendum Date: 01/24/2023   ADDENDUM REPORT: 01/24/2023 01:18 ADDENDUM: Given distribution of the previously mentioned "free intraperitoneal gas" along the sigmoid colon and its mesentery, mesenteric venous gas is  more likely in the setting of given history: hydrogen peroxide ingestion PO and rectal. Perforated acute diverticulitis is not excluded but less likely (7:65.) Concern for sigmoiditis and proctitis with mesenteric venous gas related to hydrogen peroxide ingestion/rectal administration. No pneumatosis identified. No free intraperitoneal fluid. No portal venous gas identified. Recommend colonoscopy status post treatment and status post complete resolution of inflammatory changes to exclude an underlying lesion. These results were called by telephone at the time of interpretation on 01/24/2023 at 1:03 am to provider Dr. Pilar Plate, who verbally acknowledged these results. Electronically Signed   By: Tish Frederickson M.D.   On: 01/24/2023 01:18   Result Date: 01/24/2023 CLINICAL DATA:  RUQ PAIN, HYDROGEN PEROXIDE INGESTION EXAM: CT ABDOMEN  AND PELVIS WITH CONTRAST TECHNIQUE: Multidetector CT imaging of the abdomen and pelvis was performed using the standard protocol following bolus administration of intravenous contrast. RADIATION DOSE REDUCTION: This exam was performed according to the departmental dose-optimization program which includes automated exposure control, adjustment of the mA and/or kV according to patient size and/or use of iterative reconstruction technique. CONTRAST:  60mL OMNIPAQUE IOHEXOL 350 MG/ML SOLN COMPARISON:  None Available. FINDINGS: Lower chest: Prominent heart size.  No acute abnormality. Hepatobiliary: No focal liver abnormality. Calcified gallstone noted within the gallbladder lumen. No gallbladder wall thickening or pericholecystic fluid. No biliary dilatation. Pancreas: No focal lesion. Normal pancreatic contour. No surrounding inflammatory changes. No main pancreatic ductal dilatation. Spleen: Normal in size without focal abnormality. Adrenals/Urinary Tract: No adrenal nodule bilaterally. Bilateral atrophic kidneys. Bilateral kidneys enhance symmetrically. No hydronephrosis. No hydroureter. Limited evaluation of the urinary bladder lumen due to streak artifact originating from bilateral femoral surgical hardware. The urinary bladder is unremarkable. Stomach/Bowel: Stomach is within normal limits. No evidence of small bowel wall thickening or dilatation. Mid to distal sigmoid bowel wall thickening with associated mild pericolonic fat stranding and free foci of gas scattered along the sigmoid colon, pelvis and mid abdomen. No intramural abscess formation. Circumferential bowel thickening of the rectum. Limited evaluation of the rectum due to streak artifact originating from bilateral femoral surgical hardware.Appendix appears normal. Vascular/Lymphatic: No abdominal aorta or iliac aneurysm. Mild atherosclerotic plaque of the aorta and its branches. No abdominal, pelvic, or inguinal lymphadenopathy. Reproductive: Limited  evaluation due to streak artifact originating from bilateral femoral surgical hardware. Other: No intraperitoneal free fluid. Abdomen and pelvis positive for free intraperitoneal gas grossly scattered throughout the mesentery of the sigmoid colon but also noted within the mid abdomen and pelvis. No organized fluid collection. Musculoskeletal: No abdominal wall hernia or abnormality. No suspicious lytic or blastic osseous lesions. No acute displaced fracture. Multilevel severe degenerative changes of the spine. Bilateral total hip arthroplasty. IMPRESSION: 1. Colonic diverticulosis with acute perforated mid to distal sigmoid colon diverticulitis. No organized fluid collection. Recommend colonoscopy status post treatment and status post complete resolution of inflammatory changes to exclude an underlying lesion. 2. Circumferential bowel wall thickening of the rectum which may represent reactive changes versus proctitis. Limited evaluation of the rectum due to streak artifact originating from bilateral femoral surgical hardware. 3. Cholelithiasis with no CT finding of acute cholecystitis. 4. Atrophic kidneys. 5.  Aortic Atherosclerosis (ICD10-I70.0). Electronically Signed: By: Tish Frederickson M.D. On: 01/24/2023 00:59   CT HEAD WO CONTRAST ( )  Result Date: 01/24/2023 CLINICAL DATA:  Delirium EXAM: CT HEAD WITHOUT CONTRAST TECHNIQUE: Contiguous axial images were obtained from the base of the skull through the vertex without intravenous contrast. RADIATION DOSE REDUCTION: This exam was performed according to the departmental  dose-optimization program which includes automated exposure control, adjustment of the mA and/or kV according to patient size and/or use of iterative reconstruction technique. COMPARISON:  CT head 04/20/2013, MRI head 09/11/2011 FINDINGS: Brain: Patchy and confluent areas of decreased attenuation are noted throughout the deep and periventricular white matter of the cerebral hemispheres  bilaterally, compatible with chronic microvascular ischemic disease. No evidence of large-territorial acute infarction. No parenchymal hemorrhage. No mass lesion. No extra-axial collection. No mass effect or midline shift. No hydrocephalus. Basilar cisterns are patent. Vascular: No hyperdense vessel. Skull: No acute fracture or focal lesion. Sinuses/Orbits: Bilateral maxillary sinus mucosal thickening. Otherwise paranasal sinuses and mastoid air cells are clear. 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 The right orbit is unremarkable. Other: None. IMPRESSION: No acute intracranial abnormality. Electronically Signed   By: Tish Frederickson M.D.   On: 01/24/2023 01:01   DG Chest Portable 1 View  Result Date: 01/23/2023 CLINICAL DATA:  Ingestion. EXAM: PORTABLE CHEST 1 VIEW COMPARISON:  Chest x-ray 05/11/2022 FINDINGS: Heart is enlarged, unchanged. There central pulmonary vascular congestion. There is no radiopaque foreign body. The lungs and costophrenic angles are clear. There is no pneumothorax or acute fracture. IMPRESSION: 1. Cardiomegaly with central pulmonary vascular congestion. 2. No radiopaque foreign body. Electronically Signed   By: Darliss Cheney M.D.   On: 01/23/2023 23:45      Paul [name] 01/25/2023  Patient ID: Paul [name], male   DOB: [birth year], 57 y.o.   MRN: [id number]

## 2023-01-25 NOTE — Progress Notes (Signed)
[facility] Progress Note  Subjective: seen in room  Vitals:   01/24/23 2354 01/25/23 0426 01/25/23 0534 01/25/23 0735  BP: (!) 145/90 137/74 (!) 142/85 (!) 157/80  Pulse: (!) 101 96  99  Resp:  20  16  Temp:  98.6 F (37 C)  98.7 F (37.1 C)  TempSrc:  Oral  Oral  SpO2:  98%  99%  Weight:        Exam: Gen alert, no distress No jvd or bruits Chest clear bilat to bases RRR no MRG Abd soft ntnd no mass or ascites +bs Ext no LE edema Neuro is alert, Ox 3 , nf    RUA AVF+bruit      Renal-related home meds: - coreg 6.25 bid - velphoro 1gm ac tid  - imdur 60 every day      OP HD: MWF G-O  4h  400/600  75.4kg  2K/3Ca bath   RUA AVF  Heparin 2400 - last OP HD 9/16, post wt 74.7kg - coming off 0.5kg under dry wt; dry wt up and down last 3 wks - rocaltrol 0.25 mcg po three times per week - mircera 75 mcg IV q 2 wks, last 9/04, due 9/18 (pt missed)    CXR 9/19 - vasc congestion, no edema     Assessment/ Plan: Caustic ingestion - of H2O2. CT abd showed prob mesenteric venous gas along the sigmoid colon. Started on IV zosyn, gen surg is following.  Hyperkalemia - resolved sp temp measures and iHD yesterday.  ESRD - on HD MWF. Missed Wed outpt HD. Had HD here Friday. Next HD Monday.  HTN - bp's good, cont home meds Volume - euvolemic on exam, close to dry wt. Follow.  Anemia esrd - Hb 10-11 here, missed recent esa dose. No need for esa at this time.  MBD ckd - CCa in range, add on phos. Cont po vdra and binder.  Bipolar d/o and schizophrenia     Rob Arlean Hopping MD  [facility] 01/25/2023, 12:12 PM  Recent Labs  Lab 01/24/23 0713 01/24/23 1602 01/25/23 0252  HGB 11.1*  --  10.7*  ALBUMIN 3.8  --  3.6  CALCIUM 8.0*  --  8.2*  CREATININE 12.18*  --  7.65*  K 6.6* 3.0* 3.9   No results for input(s): "IRON", "TIBC", "FERRITIN" in the last 168 hours. Inpatient medications:  calcitRIOL  0.25 mcg Oral Q M,W,F   Chlorhexidine Gluconate Cloth  6 each Topical Q0600    heparin  5,000 Units Subcutaneous Q8H   hydrALAZINE  10 mg Intravenous Q6H   pantoprazole (PROTONIX) IV  40 mg Intravenous Q12H   sucroferric oxyhydroxide  1,000 mg Oral TID WC    piperacillin-tazobactam (ZOSYN)  IV 2.25 g (01/25/23 0255)   acetaminophen **OR** acetaminophen, metoprolol tartrate, ondansetron **OR** ondansetron (ZOFRAN) IV

## 2023-01-26 DIAGNOSIS — E872 Acidosis, unspecified: Secondary | ICD-10-CM | POA: Diagnosis present

## 2023-01-26 DIAGNOSIS — I5023 Acute on chronic systolic (congestive) heart failure: Secondary | ICD-10-CM | POA: Diagnosis not present

## 2023-01-26 DIAGNOSIS — F319 Bipolar disorder, unspecified: Secondary | ICD-10-CM | POA: Diagnosis present

## 2023-01-26 DIAGNOSIS — Z79899 Other long term (current) drug therapy: Secondary | ICD-10-CM | POA: Diagnosis not present

## 2023-01-26 DIAGNOSIS — R109 Unspecified abdominal pain: Secondary | ICD-10-CM | POA: Diagnosis not present

## 2023-01-26 DIAGNOSIS — Z880 Allergy status to penicillin: Secondary | ICD-10-CM | POA: Diagnosis not present

## 2023-01-26 DIAGNOSIS — K5721 Diverticulitis of large intestine with perforation and abscess with bleeding: Secondary | ICD-10-CM | POA: Diagnosis present

## 2023-01-26 DIAGNOSIS — K5720 Diverticulitis of large intestine with perforation and abscess without bleeding: Secondary | ICD-10-CM | POA: Diagnosis not present

## 2023-01-26 DIAGNOSIS — D631 Anemia in chronic kidney disease: Secondary | ICD-10-CM | POA: Diagnosis present

## 2023-01-26 DIAGNOSIS — T543X1A Toxic effect of corrosive alkalis and alkali-like substances, accidental (unintentional), initial encounter: Secondary | ICD-10-CM | POA: Diagnosis present

## 2023-01-26 DIAGNOSIS — T490X5A Adverse effect of local antifungal, anti-infective and anti-inflammatory drugs, initial encounter: Secondary | ICD-10-CM | POA: Diagnosis not present

## 2023-01-26 DIAGNOSIS — Z96643 Presence of artificial hip joint, bilateral: Secondary | ICD-10-CM | POA: Diagnosis present

## 2023-01-26 DIAGNOSIS — Z87891 Personal history of nicotine dependence: Secondary | ICD-10-CM | POA: Diagnosis not present

## 2023-01-26 DIAGNOSIS — Z8249 Family history of ischemic heart disease and other diseases of the circulatory system: Secondary | ICD-10-CM | POA: Diagnosis not present

## 2023-01-26 DIAGNOSIS — E8889 Other specified metabolic disorders: Secondary | ICD-10-CM | POA: Diagnosis present

## 2023-01-26 DIAGNOSIS — R000 Tachycardia, unspecified: Secondary | ICD-10-CM | POA: Diagnosis present

## 2023-01-26 DIAGNOSIS — H409 Unspecified glaucoma: Secondary | ICD-10-CM | POA: Diagnosis present

## 2023-01-26 DIAGNOSIS — R1084 Generalized abdominal pain: Secondary | ICD-10-CM | POA: Diagnosis present

## 2023-01-26 DIAGNOSIS — E782 Mixed hyperlipidemia: Secondary | ICD-10-CM | POA: Diagnosis present

## 2023-01-26 DIAGNOSIS — F209 Schizophrenia, unspecified: Secondary | ICD-10-CM | POA: Diagnosis present

## 2023-01-26 DIAGNOSIS — N186 End stage renal disease: Secondary | ICD-10-CM | POA: Diagnosis present

## 2023-01-26 DIAGNOSIS — Z992 Dependence on renal dialysis: Secondary | ICD-10-CM | POA: Diagnosis not present

## 2023-01-26 DIAGNOSIS — E875 Hyperkalemia: Secondary | ICD-10-CM | POA: Diagnosis present

## 2023-01-26 DIAGNOSIS — I5042 Chronic combined systolic (congestive) and diastolic (congestive) heart failure: Secondary | ICD-10-CM | POA: Diagnosis present

## 2023-01-26 DIAGNOSIS — I132 Hypertensive heart and chronic kidney disease with heart failure and with stage 5 chronic kidney disease, or end stage renal disease: Secondary | ICD-10-CM | POA: Diagnosis present

## 2023-01-26 LAB — RENAL FUNCTION PANEL
Albumin: 3.4 g/dL — ABNORMAL LOW (ref 3.5–5.0)
Anion gap: 20 — ABNORMAL HIGH (ref 5–15)
BUN: 45 mg/dL — ABNORMAL HIGH (ref 6–20)
CO2: 23 mmol/L (ref 22–32)
Calcium: 7.7 mg/dL — ABNORMAL LOW (ref 8.9–10.3)
Chloride: 91 mmol/L — ABNORMAL LOW (ref 98–111)
Creatinine, Ser: 10.38 mg/dL — ABNORMAL HIGH (ref 0.61–1.24)
GFR, Estimated: 5 mL/min — ABNORMAL LOW (ref >60)
Glucose, Bld: 108 mg/dL — ABNORMAL HIGH (ref 70–99)
Phosphorus: 7 mg/dL — ABNORMAL HIGH (ref 2.5–4.6)
Potassium: 4.3 mmol/L (ref 3.5–5.1)
Sodium: 134 mmol/L — ABNORMAL LOW (ref 135–145)

## 2023-01-26 LAB — CBC
HCT: 31.9 % — ABNORMAL LOW (ref 39.0–52.0)
Hemoglobin: 10.8 g/dL — ABNORMAL LOW (ref 13.0–17.0)
MCH: 30.8 pg (ref 26.0–34.0)
MCHC: 33.9 g/dL (ref 30.0–36.0)
MCV: 90.9 fL (ref 80.0–100.0)
Platelets: 210 10*3/uL (ref 150–400)
RBC: 3.51 MIL/uL — ABNORMAL LOW (ref 4.22–5.81)
RDW: 15.3 % (ref 11.5–15.5)
WBC: 13.2 10*3/uL — ABNORMAL HIGH (ref 4.0–10.5)
nRBC: 0 % (ref 0.0–0.2)

## 2023-01-26 LAB — GLUCOSE, CAPILLARY
Glucose-Capillary: 107 mg/dL — ABNORMAL HIGH (ref 70–99)
Glucose-Capillary: 112 mg/dL — ABNORMAL HIGH (ref 70–99)
Glucose-Capillary: 129 mg/dL — ABNORMAL HIGH (ref 70–99)
Glucose-Capillary: 84 mg/dL (ref 70–99)
Glucose-Capillary: 98 mg/dL (ref 70–99)

## 2023-01-26 MED ORDER — POLYETHYLENE GLYCOL 3350 17 G PO PACK
17.0000 g | PACK | Freq: Every day | ORAL | Status: DC
  Administered 2023-01-26 – 2023-01-28 (×2): 17 g via ORAL
  Filled 2023-01-26 (×2): qty 1

## 2023-01-26 MED ORDER — CHLORHEXIDINE GLUCONATE CLOTH 2 % EX PADS
6.0000 | MEDICATED_PAD | Freq: Every day | CUTANEOUS | Status: DC
  Administered 2023-01-27 – 2023-01-28 (×2): 6 via TOPICAL

## 2023-01-26 NOTE — Progress Notes (Signed)
Assessment & Plan: HD#3 - ingestion and enema with hydrogen peroxide - 57 yo male with ESRD, CHF, and bipolar disease - abdominal exam remains benign, had small BM, advance to soft diet - empiric IV abx per medical service - WBC 14.4 yesterday - continue serial exams as diet advanced        [name], MD [address] [facility] practice Office: [phone]        Chief Complaint: Ingestion hydrogen peroxide  Subjective: Patient comfortable, states had small BM  Objective: Vital signs in last 24 hours: Temp:  [98.3 F (36.8 C)-98.6 F (37 C)] 98.3 F (36.8 C) (09/22 0447) Pulse Rate:  [87-95] 95 (09/22 0447) Resp:  [16-21] 20 (09/22 0447) BP: (144-157)/(72-75) 144/75 (09/22 0447) SpO2:  [97 %-99 %] 97 % (09/22 0447) Last BM Date : 01/26/23  Intake/Output from previous day: 09/21 0701 - 09/22 0700 In: 100 [IV Piggyback:100] Out: -  Intake/Output this shift: No intake/output data recorded.  Physical Exam: HEENT - sclerae clear, mucous membranes moist Abdomen - soft without distension; non-tender; no guarding  Lab Results:  Recent Labs    01/24/23 0713 01/25/23 0252  WBC 16.6* 14.4*  HGB 11.1* 10.7*  HCT 31.0* 29.9*  PLT 205 205   BMET Recent Labs    01/24/23 0713 01/24/23 1602 01/25/23 0252  NA 136  --  135  K 6.6* 3.0* 3.9  CL 98  --  97*  CO2 16*  --  23  GLUCOSE 158*  --  88  BUN 96*  --  38*  CREATININE 12.18*  --  7.65*  CALCIUM 8.0*  --  8.2*   PT/INR Recent Labs    01/23/23 2334  LABPROT 13.9  INR 1.1   Comprehensive Metabolic Panel:    Component Value Date/Time   NA 135 01/25/2023 0252   NA 136 01/24/2023 0713   NA 138 06/14/2021 1350   K 3.9 01/25/2023 0252   K 3.0 (L) 01/24/2023 1602   CL 97 (L) 01/25/2023 0252   CL 98 01/24/2023 0713   CO2 23 01/25/2023 0252   CO2 16 (L) 01/24/2023 0713   BUN 38 (H) 01/25/2023 0252   BUN 96 (H) 01/24/2023 0713   BUN 52 (H) 06/14/2021 1350   CREATININE 7.65 (H)  01/25/2023 0252   CREATININE 12.18 (H) 01/24/2023 0713   GLUCOSE 88 01/25/2023 0252   GLUCOSE 158 (H) 01/24/2023 0713   CALCIUM 8.2 (L) 01/25/2023 0252   CALCIUM 8.0 (L) 01/24/2023 0713   CALCIUM 5.8 (LL) 05/12/2022 0636   AST 12 (L) 01/25/2023 0252   AST 12 (L) 01/24/2023 0713   ALT 14 01/25/2023 0252   ALT 16 01/24/2023 0713   ALKPHOS 57 01/25/2023 0252   ALKPHOS 48 01/24/2023 0713   BILITOT 1.7 (H) 01/25/2023 0252   BILITOT 1.6 (H) 01/24/2023 0713   BILITOT 0.3 06/14/2021 1350   PROT 7.2 01/25/2023 0252   PROT 7.5 01/24/2023 0713   PROT 6.7 06/14/2021 1350   ALBUMIN 3.6 01/25/2023 0252   ALBUMIN 3.8 01/24/2023 0713   ALBUMIN 4.1 06/14/2021 1350    Studies/Results: No results found.    [name] 01/26/2023  Patient ID: Paul [name], male   DOB: 1965/09/28, 57 y.o.   MRN: [id number]

## 2023-01-26 NOTE — Progress Notes (Signed)
 [facility] Progress Note  Subjective: seen in room  Vitals:   01/25/23 0735 01/25/23 1539 01/25/23 1953 01/26/23 0447  BP: (!) 157/80 (!) 157/74 (!) 152/72 (!) 144/75  Pulse: 99 87 94 95  Resp: 16 16 (!) 21 20  Temp: 98.7 F (37.1 C) 98.6 F (37 C) 98.5 F (36.9 C) 98.3 F (36.8 C)  TempSrc: Oral Oral Oral Oral  SpO2: 99% 99% 98% 97%  Weight:        Exam: Gen alert, no distress No jvd or bruits Chest clear bilat to bases RRR no MRG Abd soft ntnd no mass or ascites +bs Ext no LE edema Neuro is alert, Ox 3 , nf    RUA AVF+bruit      Renal-related home meds: - coreg 6.25 bid - velphoro 1gm ac tid  - imdur 60 every day      OP HD: MWF G-O  4h  400/600  75.4kg  2K/3Ca bath   RUA AVF  Heparin 2400 - last OP HD 9/16, post wt 74.7kg - coming off 0.5kg under dry wt; dry wt up and down last 3 wks - rocaltrol 0.25 mcg po three times per week - mircera 75 mcg IV q 2 wks, last 9/04, due 9/18 (pt missed)    CXR 9/19 - vasc congestion, no edema     Assessment/ Plan: Caustic ingestion - of H2O2. CT abd showed prob mesenteric venous gas along the sigmoid colon. Started on IV zosyn, gen surg is following.  Hyperkalemia - resolved after HD Friday.  ESRD - on HD MWF. Missed Wed outpt HD. Had HD here Friday. Next HD Monday.  HTN - bp's good, cont home meds Volume - euvolemic on exam, close to dry wt. Minimal UF goal next HD.  Anemia esrd - Hb 10-11 here, missed recent esa dose. No need for esa at this time.  MBD ckd - CCa in range, and phos is in range. Cont po vdra and binder.  Bipolar d/o and schizophrenia     Paul Arlean Hopping MD  [facility] 01/26/2023, 12:19 PM  Recent Labs  Lab 01/24/23 0713 01/24/23 1602 01/25/23 0252 01/25/23 1300 01/26/23 0308  HGB 11.1*  --  10.7*  --  10.8*  ALBUMIN 3.8  --  3.6  --   --   CALCIUM 8.0*  --  8.2*  --   --   PHOS  --   --   --  5.8*  --   CREATININE 12.18*  --  7.65*  --   --   K 6.6* 3.0* 3.9  --   --    No results  for input(s): "IRON", "TIBC", "FERRITIN" in the last 168 hours. Inpatient medications:  calcitRIOL  0.25 mcg Oral Q M,W,F   Chlorhexidine Gluconate Cloth  6 each Topical Q0600   heparin  5,000 Units Subcutaneous Q8H   hydrALAZINE  10 mg Intravenous Q6H   pantoprazole (PROTONIX) IV  40 mg Intravenous Q12H   sucroferric oxyhydroxide  1,000 mg Oral TID WC    piperacillin-tazobactam (ZOSYN)  IV 2.25 g (01/26/23 0334)   acetaminophen **OR** acetaminophen, metoprolol tartrate, ondansetron **OR** ondansetron (ZOFRAN) IV

## 2023-01-26 NOTE — Progress Notes (Signed)
PROGRESS NOTE    Paul [name]  ZOX:[id number] DOB: 1965-05-23 DOA: 01/23/2023 PCP: Marcine Matar, MD   Brief Narrative: 57 year old with past medical history significant for bipolar disorder, schizophrenia, hypertension, ESRD (MWF) heart failure reduced ejection fraction presents with abdominal pain.  Patient relates that he wanted to do with cleanse.  He ingested 4 ounces of hydroxy peroxide orally and use a small amount of hydroperoxide  to make an enema to clean his colon.  After injection he developed abdominal pain, nausea and presented to the ED for further evaluation.  Evaluation in the ED he was found to be hypertensive systolic blood pressure 194, potassium 5.7, white blood cell 14, CT head normal.  CT abdomen and pelvis showed gas initially thought to be perforation however radiologist added a correction: "Free intraperitoneal gas along the sigmoid and its mesentery, mesenteric venous gas is more likely in the setting of given history of hydrogen peroxide ingestion p.o. or rectal.  Perforated acute diverticulitis is not excluded but less likely.  Concern for sigmoiditis and proctitis with mesenteric venous gas related to hydrogen peroxide injection rectal administration.  No free intraperitoneal fluid".   Assessment & Plan:   Principal Problem:   Ingestion of caustic substance Active Problems:   Essential hypertension   Schizophrenia (HCC)   Bipolar disorder (HCC)   Chronic combined systolic and diastolic CHF (congestive heart failure) (HCC)   Mixed hyperlipidemia   Hypocalcemia   Perforated diverticulum   ESRD (end stage renal disease) (HCC)   1-Caustic Ingestion hydrogen peroxide -Case was discussed with poison control, recommended monitoring for air emboli, monitor for headaches or chest pain.  Minimal in 14 hours observation. -patient denies chest pain, headaches.  -he was trying to cleanse his colon. He denies trying to harm himself.  Abdominal pain improved.  Diet advanced to soft today.   Possible sigmoiditis and proctitis with mesenteric venous gas related to hydrogen peroxide injection rectal administration Bloody stool/ less amount per patient. Hb stable.  -Surgery Following.  -IV Zosyn.  -IV Protonix.  -Diet advance to Soft.  -WBC trending down 16--14-14  Bipolar, Schizophrenia: Not on medications.  Answer questions appropriately.   ESRD hyperkalemia, metabolic acidosis.  K at 6.6 S/P Calcium gluconate, amp bicarb, insulin amp D 50  Nephrology consulted for HD.  Had HD 9/20. Hyperkalemia resolved.   Chronic combined systolic and diastolic heart failure Volume manage with HD.   Essential hypertension Hydralazine.   Mixed hyperlipidemia:       Estimated body mass index is 29.13 kg/m as calculated from the following:   Height as of 11/29/22: 5\' 3"  (1.6 m).   Weight as of this encounter: 74.6 kg.   DVT prophylaxis: heparin  Code Status: Full code Family Communication: Care discussed with patient.  Disposition Plan:  Status is: Inpatient Remains inpatient appropriate because: management of hyperkalemia, ingestion of caustic    Consultants:  Surgery Nephrology   Procedures:    Antimicrobials:    Subjective: Abdominal pain improved. Tolerating clear liquid diet.   Objective: Vitals:   01/25/23 1539 01/25/23 1953 01/26/23 0447 01/26/23 1311  BP: (!) 157/74 (!) 152/72 (!) 144/75 (!) 166/82  Pulse: 87 94 95 93  Resp: 16 (!) 21 20 18   Temp: 98.6 F (37 C) 98.5 F (36.9 C) 98.3 F (36.8 C) 98.1 F (36.7 C)  TempSrc: Oral Oral Oral Oral  SpO2: 99% 98% 97% 97%  Weight:        Intake/Output Summary (Last 24 hours) at 01/26/2023  1428 Last data filed at 01/26/2023 0106 Gross per 24 hour  Intake 100 ml  Output --  Net 100 ml   Filed Weights   01/24/23 1037 01/24/23 1431  Weight: 75.6 kg 74.6 kg    Examination:  General exam: NAD Respiratory system: CTA Cardiovascular system: S 1, S 2  RRR Gastrointestinal system: BS present, soft, nt Central nervous system: Alert Extremities: no edema  Data Reviewed: I have personally reviewed following labs and imaging studies  CBC: Recent Labs  Lab 01/23/23 2334 01/23/23 2340 01/24/23 0713 01/25/23 0252 01/26/23 0308  WBC 14.8*  --  16.6* 14.4* 13.2*  NEUTROABS 13.2*  --  15.2* 12.1*  --   HGB 10.5* 11.6*  11.2* 11.1* 10.7* 10.8*  HCT 30.9* 34.0*  33.0* 31.0* 29.9* 31.9*  MCV 90.1  --  89.3 90.9 90.9  PLT 247  --  205 205 210   Basic Metabolic Panel: Recent Labs  Lab 01/23/23 2334 01/23/23 2340 01/24/23 0713 01/24/23 1602 01/25/23 0252 01/25/23 1300  NA 137 135  135 136  --  135  --   K 5.8* 5.7*  5.7* 6.6* 3.0* 3.9  --   CL 98 104 98  --  97*  --   CO2 17*  --  16*  --  23  --   GLUCOSE 108* 100* 158*  --  88  --   BUN 90* 101* 96*  --  38*  --   CREATININE 11.65* 12.70* 12.18*  --  7.65*  --   CALCIUM 8.3*  --  8.0*  --  8.2*  --   MG  --   --   --   --  1.8  --   PHOS  --   --   --   --   --  5.8*   GFR: Estimated Creatinine Clearance: 9.4 mL/min (A) (by C-G formula based on SCr of 7.65 mg/dL (H)). Liver Function Tests: Recent Labs  Lab 01/23/23 2334 01/24/23 0713 01/25/23 0252  AST 13* 12* 12*  ALT 19 16 14   ALKPHOS 54 48 57  BILITOT 1.2 1.6* 1.7*  PROT 8.0 7.5 7.2  ALBUMIN 4.3 3.8 3.6   No results for input(s): "LIPASE", "AMYLASE" in the last 168 hours. No results for input(s): "AMMONIA" in the last 168 hours. Coagulation Profile: Recent Labs  Lab 01/23/23 2334  INR 1.1   Cardiac Enzymes: No results for input(s): "CKTOTAL", "CKMB", "CKMBINDEX", "TROPONINI" in the last 168 hours. BNP (last 3 results) No results for input(s): "PROBNP" in the last 8760 hours. HbA1C: No results for input(s): "HGBA1C" in the last 72 hours. CBG: Recent Labs  Lab 01/25/23 1953 01/26/23 0037 01/26/23 0448 01/26/23 0724 01/26/23 1308  GLUCAP 120* 98 129* 84 112*   Lipid Profile: No results for  input(s): "CHOL", "HDL", "LDLCALC", "TRIG", "CHOLHDL", "LDLDIRECT" in the last 72 hours. Thyroid Function Tests: No results for input(s): "TSH", "T4TOTAL", "FREET4", "T3FREE", "THYROIDAB" in the last 72 hours. Anemia Panel: No results for input(s): "VITAMINB12", "FOLATE", "FERRITIN", "TIBC", "IRON", "RETICCTPCT" in the last 72 hours. Sepsis Labs: Recent Labs  Lab 01/24/23 0149 01/24/23 0713  LATICACIDVEN 1.0 1.8    No results found for this or any previous visit (from the past 240 hour(s)).       Radiology Studies: No results found.      Scheduled Meds:  calcitRIOL  0.25 mcg Oral Q M,W,F   Chlorhexidine Gluconate Cloth  6 each Topical Q0600   [START ON 01/27/2023] Chlorhexidine  Gluconate Cloth  6 each Topical Q0600   heparin  5,000 Units Subcutaneous Q8H   hydrALAZINE  10 mg Intravenous Q6H   pantoprazole (PROTONIX) IV  40 mg Intravenous Q12H   polyethylene glycol  17 g Oral Daily   sucroferric oxyhydroxide  1,000 mg Oral TID WC   Continuous Infusions:  piperacillin-tazobactam (ZOSYN)  IV 2.25 g (01/26/23 1231)     LOS: 1 day    Time spent: 35 minutes    Roshaun Pound A Irisa Grimsley, MD [facility]   If 7PM-7AM, please contact night-coverage [email or web address]  01/26/2023, 2:28 PM

## 2023-01-26 NOTE — Plan of Care (Signed)

## 2023-01-26 NOTE — Plan of Care (Signed)
Progressing

## 2023-01-27 DIAGNOSIS — T490X5A Adverse effect of local antifungal, anti-infective and anti-inflammatory drugs, initial encounter: Secondary | ICD-10-CM | POA: Diagnosis not present

## 2023-01-27 LAB — RENAL FUNCTION PANEL
Albumin: 3.3 g/dL — ABNORMAL LOW (ref 3.5–5.0)
Anion gap: 19 — ABNORMAL HIGH (ref 5–15)
BUN: 49 mg/dL — ABNORMAL HIGH (ref 6–20)
CO2: 21 mmol/L — ABNORMAL LOW (ref 22–32)
Calcium: 7.9 mg/dL — ABNORMAL LOW (ref 8.9–10.3)
Chloride: 94 mmol/L — ABNORMAL LOW (ref 98–111)
Creatinine, Ser: 11.75 mg/dL — ABNORMAL HIGH (ref 0.61–1.24)
GFR, Estimated: 5 mL/min — ABNORMAL LOW (ref >60)
Glucose, Bld: 94 mg/dL (ref 70–99)
Phosphorus: 7.3 mg/dL — ABNORMAL HIGH (ref 2.5–4.6)
Potassium: 4.1 mmol/L (ref 3.5–5.1)
Sodium: 134 mmol/L — ABNORMAL LOW (ref 135–145)

## 2023-01-27 LAB — CBC
HCT: 31 % — ABNORMAL LOW (ref 39.0–52.0)
Hemoglobin: 10.5 g/dL — ABNORMAL LOW (ref 13.0–17.0)
MCH: 31.3 pg (ref 26.0–34.0)
MCHC: 33.9 g/dL (ref 30.0–36.0)
MCV: 92.3 fL (ref 80.0–100.0)
Platelets: 228 10*3/uL (ref 150–400)
RBC: 3.36 MIL/uL — ABNORMAL LOW (ref 4.22–5.81)
RDW: 15.1 % (ref 11.5–15.5)
WBC: 13.6 10*3/uL — ABNORMAL HIGH (ref 4.0–10.5)
nRBC: 0 % (ref 0.0–0.2)

## 2023-01-27 LAB — GLUCOSE, CAPILLARY
Glucose-Capillary: 100 mg/dL — ABNORMAL HIGH (ref 70–99)
Glucose-Capillary: 114 mg/dL — ABNORMAL HIGH (ref 70–99)
Glucose-Capillary: 123 mg/dL — ABNORMAL HIGH (ref 70–99)
Glucose-Capillary: 148 mg/dL — ABNORMAL HIGH (ref 70–99)
Glucose-Capillary: 91 mg/dL (ref 70–99)

## 2023-01-27 MED ORDER — HEPARIN SODIUM (PORCINE) 1000 UNIT/ML DIALYSIS
2400.0000 [IU] | Freq: Once | INTRAMUSCULAR | Status: AC
  Administered 2023-01-27: 2400 [IU] via INTRAVENOUS_CENTRAL
  Filled 2023-01-27: qty 3

## 2023-01-27 MED ORDER — PENTAFLUOROPROP-TETRAFLUOROETH EX AERO: 1.0000 | INHALATION_SPRAY | CUTANEOUS | Status: DC | PRN

## 2023-01-27 MED ORDER — HEPARIN SODIUM (PORCINE) 1000 UNIT/ML DIALYSIS: 1000.0000 [IU] | INTRAMUSCULAR | Status: DC | PRN

## 2023-01-27 MED ORDER — ALTEPLASE 2 MG IJ SOLR: 2.0000 mg | Freq: Once | INTRAMUSCULAR | Status: DC | PRN

## 2023-01-27 MED ORDER — ANTICOAGULANT SODIUM CITRATE 4% (200MG/5ML) IV SOLN: 5.0000 mL | Status: DC | PRN

## 2023-01-27 MED ORDER — CARVEDILOL 6.25 MG PO TABS
6.2500 mg | ORAL_TABLET | Freq: Two times a day (BID) | ORAL | Status: DC
  Administered 2023-01-27 – 2023-01-28 (×2): 6.25 mg via ORAL
  Filled 2023-01-27 (×2): qty 1

## 2023-01-27 MED ORDER — LIDOCAINE-PRILOCAINE 2.5-2.5 % EX CREA: 1.0000 | TOPICAL_CREAM | CUTANEOUS | Status: DC | PRN

## 2023-01-27 MED ORDER — LIDOCAINE HCL (PF) 1 % IJ SOLN: 5.0000 mL | INTRAMUSCULAR | Status: DC | PRN

## 2023-01-27 MED ORDER — NEPRO/CARBSTEADY PO LIQD: 237.0000 mL | ORAL | Status: DC | PRN

## 2023-01-27 NOTE — Procedures (Signed)
Patient was seen on dialysis and the procedure was supervised.  BFR 400  Via R AVF BP is  152/62.   Patient appears to be tolerating treatment well.  Paul [name] [name] 01/27/2023

## 2023-01-27 NOTE — Progress Notes (Signed)
Pt transported to HD via bed by transportation staff

## 2023-01-27 NOTE — Progress Notes (Signed)
PROGRESS NOTE    Paul [name]  ZOX:[id number] DOB: [birth year] DOA: 01/23/2023 PCP: Marcine Matar, MD   Brief Narrative: 57 year old with past medical history significant for bipolar disorder, schizophrenia, hypertension, ESRD (MWF) heart failure reduced ejection fraction presents with abdominal pain.  Patient relates that he wanted to do with cleanse.  He ingested 4 ounces of hydroxy peroxide orally and use a small amount of hydroperoxide  to make an enema to clean his colon.  After injection he developed abdominal pain, nausea and presented to the ED for further evaluation.  Evaluation in the ED he was found to be hypertensive systolic blood pressure 194, potassium 5.7, white blood cell 14, CT head normal.  CT abdomen and pelvis showed gas initially thought to be perforation however radiologist added a correction: "Free intraperitoneal gas along the sigmoid and its mesentery, mesenteric venous gas is more likely in the setting of given history of hydrogen peroxide ingestion p.o. or rectal.  Perforated acute diverticulitis is not excluded but less likely.  Concern for sigmoiditis and proctitis with mesenteric venous gas related to hydrogen peroxide injection rectal administration.  No free intraperitoneal fluid".   Assessment & Plan:   Principal Problem:   Ingestion of caustic substance Active Problems:   Essential hypertension   Schizophrenia (HCC)   Bipolar disorder (HCC)   Chronic combined systolic and diastolic CHF (congestive heart failure) (HCC)   Mixed hyperlipidemia   Hypocalcemia   Perforated diverticulum   ESRD (end stage renal disease) (HCC)   1-Caustic Ingestion hydrogen peroxide -Case was discussed with poison control, recommended monitoring for air emboli, monitor for headaches or chest pain.  Minimal in 14 hours observation. -patient denies chest pain, headaches.  -he was trying to cleanse his colon. He denies trying to harm himself.  Abdominal pain improved.  Diet advanced to soft today.   Possible sigmoiditis and proctitis with mesenteric venous gas related to hydrogen peroxide injection rectal administration Bloody stool/ less amount per patient. Hb stable.  -Surgery Following.  -IV Zosyn.  -IV Protonix.  -Diet advance to Soft.  -WBC trending down 16--14-14--13. Not eating much, report abdominal pain today.   Bipolar, Schizophrenia: Not on medications.  Answer questions appropriately.   ESRD hyperkalemia, metabolic acidosis.  S/P Calcium gluconate, amp bicarb, insulin amp D 50  Nephrology consulted for HD.  Hyperkalemia resolved.  Had HD today.   Chronic combined systolic and diastolic heart failure Volume manage with HD.   Essential hypertension Hydralazine.  Resume carvedilol/   Mixed hyperlipidemia:       Estimated body mass index is 28.35 kg/m as calculated from the following:   Height as of 11/29/22: 5\' 3"  (1.6 m).   Weight as of this encounter: 72.6 kg.   DVT prophylaxis: heparin  Code Status: Full code Family Communication: Care discussed with patient.  Disposition Plan:  Status is: Inpatient Remains inpatient appropriate because: management of hyperkalemia, ingestion of caustic    Consultants:  Surgery Nephrology   Procedures:    Antimicrobials:    Subjective: He only ate an omelet yesterday.  He report abdominal pain  Objective: Vitals:   01/27/23 1030 01/27/23 1100 01/27/23 1130 01/27/23 1209  BP: (!) 140/71 139/80 (!) 152/86 131/80  Pulse: 79 (!) 109 83   Resp: 14 14 13 17   Temp:    98 F (36.7 C)  TempSrc:    Oral  SpO2: 97% 97% 96%   Weight:        Intake/Output Summary (Last 24 hours) at  01/27/2023 1501 Last data filed at 01/27/2023 1209 Gross per 24 hour  Intake 150 ml  Output 2000 ml  Net -1850 ml   Filed Weights   01/24/23 1037 01/24/23 1431 01/27/23 0846  Weight: 75.6 kg 74.6 kg 72.6 kg    Examination:  General exam: NAD Respiratory system: CTA Cardiovascular  system: S 1, S 2 RRR Gastrointestinal system:BS present, soft, mild tenderness.  Central nervous system: Alert Extremities: no edema  Data Reviewed: I have personally reviewed following labs and imaging studies  CBC: Recent Labs  Lab 01/23/23 2334 01/23/23 2340 01/24/23 0713 01/25/23 0252 01/26/23 0308 01/27/23 0259  WBC 14.8*  --  16.6* 14.4* 13.2* 13.6*  NEUTROABS 13.2*  --  15.2* 12.1*  --   --   HGB 10.5* 11.6*  11.2* 11.1* 10.7* 10.8* 10.5*  HCT 30.9* 34.0*  33.0* 31.0* 29.9* 31.9* 31.0*  MCV 90.1  --  89.3 90.9 90.9 92.3  PLT 247  --  205 205 210 228   Basic Metabolic Panel: Recent Labs  Lab 01/23/23 2334 01/23/23 2340 01/24/23 0713 01/24/23 1602 01/25/23 0252 01/25/23 1300 01/26/23 1325 01/27/23 0259  NA 137 135  135 136  --  135  --  134* 134*  K 5.8* 5.7*  5.7* 6.6* 3.0* 3.9  --  4.3 4.1  CL 98 104 98  --  97*  --  91* 94*  CO2 17*  --  16*  --  23  --  23 21*  GLUCOSE 108* 100* 158*  --  88  --  108* 94  BUN 90* 101* 96*  --  38*  --  45* 49*  CREATININE 11.65* 12.70* 12.18*  --  7.65*  --  10.38* 11.75*  CALCIUM 8.3*  --  8.0*  --  8.2*  --  7.7* 7.9*  MG  --   --   --   --  1.8  --   --   --   PHOS  --   --   --   --   --  5.8* 7.0* 7.3*   GFR: Estimated Creatinine Clearance: 6.1 mL/min (A) (by C-G formula based on SCr of 11.75 mg/dL (H)). Liver Function Tests: Recent Labs  Lab 01/23/23 2334 01/24/23 0713 01/25/23 0252 01/26/23 1325 01/27/23 0259  AST 13* 12* 12*  --   --   ALT 19 16 14   --   --   ALKPHOS 54 48 57  --   --   BILITOT 1.2 1.6* 1.7*  --   --   PROT 8.0 7.5 7.2  --   --   ALBUMIN 4.3 3.8 3.6 3.4* 3.3*   No results for input(s): "LIPASE", "AMYLASE" in the last 168 hours. No results for input(s): "AMMONIA" in the last 168 hours. Coagulation Profile: Recent Labs  Lab 01/23/23 2334  INR 1.1   Cardiac Enzymes: No results for input(s): "CKTOTAL", "CKMB", "CKMBINDEX", "TROPONINI" in the last 168 hours. BNP (last 3  results) No results for input(s): "PROBNP" in the last 8760 hours. HbA1C: No results for input(s): "HGBA1C" in the last 72 hours. CBG: Recent Labs  Lab 01/26/23 1308 01/26/23 2131 01/27/23 0003 01/27/23 0542 01/27/23 0810  GLUCAP 112* 107* 114* 100* 91   Lipid Profile: No results for input(s): "CHOL", "HDL", "LDLCALC", "TRIG", "CHOLHDL", "LDLDIRECT" in the last 72 hours. Thyroid Function Tests: No results for input(s): "TSH", "T4TOTAL", "FREET4", "T3FREE", "THYROIDAB" in the last 72 hours. Anemia Panel: No results for input(s): "VITAMINB12", "FOLATE", "FERRITIN", "  TIBC", "IRON", "RETICCTPCT" in the last 72 hours. Sepsis Labs: Recent Labs  Lab 01/24/23 0149 01/24/23 0713  LATICACIDVEN 1.0 1.8    No results found for this or any previous visit (from the past 240 hour(s)).       Radiology Studies: No results found.      Scheduled Meds:  calcitRIOL  0.25 mcg Oral Q M,W,F   carvedilol  6.25 mg Oral BID WC   Chlorhexidine Gluconate Cloth  6 each Topical Q0600   Chlorhexidine Gluconate Cloth  6 each Topical Q0600   heparin  5,000 Units Subcutaneous Q8H   hydrALAZINE  10 mg Intravenous Q6H   pantoprazole (PROTONIX) IV  40 mg Intravenous Q12H   polyethylene glycol  17 g Oral Daily   sucroferric oxyhydroxide  1,000 mg Oral TID WC   Continuous Infusions:  piperacillin-tazobactam (ZOSYN)  IV 2.25 g (01/27/23 1412)     LOS: 1 day    Time spent: 35 minutes    Kynadi Dragos A Shaquetta Arcos, MD [facility]   If 7PM-7AM, please contact night-coverage [email or web address]  01/27/2023, 3:01 PM

## 2023-01-27 NOTE — Progress Notes (Signed)
Report given to albert in HD. Nurse verbalized understanding of report and had no further questions

## 2023-01-27 NOTE — Progress Notes (Signed)
Subjective: CC: No current abdominal pain. Only PRN medication over the last 24 hours was 650mg  of tylenol at 1250am this morning. Tolerating soft diet (omelette). No n/v. BM yesterday.   Objective: Vital signs in last 24 hours: Temp:  [98 F (36.7 C)-98.5 F (36.9 C)] 98 F (36.7 C) (09/23 0809) Pulse Rate:  [81-105] 81 (09/23 0809) Resp:  [17-18] 17 (09/23 0809) BP: (153-177)/(74-91) 153/74 (09/23 0809) SpO2:  [97 %-98 %] 98 % (09/23 0809) Last BM Date : 01/26/23  Intake/Output from previous day: 09/22 0701 - 09/23 0700 In: 150 [IV Piggyback:150] Out: -  Intake/Output this shift: No intake/output data recorded.  PE: Gen:  Alert, NAD, pleasant Abd: Soft, ND, NT +BS,   Lab Results:  Recent Labs    01/26/23 0308 01/27/23 0259  WBC 13.2* 13.6*  HGB 10.8* 10.5*  HCT 31.9* 31.0*  PLT 210 228   BMET Recent Labs    01/26/23 1325 01/27/23 0259  NA 134* 134*  K 4.3 4.1  CL 91* 94*  CO2 23 21*  GLUCOSE 108* 94  BUN 45* 49*  CREATININE 10.38* 11.75*  CALCIUM 7.7* 7.9*   PT/INR No results for input(s): "LABPROT", "INR" in the last 72 hours. CMP     Component Value Date/Time   NA 134 (L) 01/27/2023 0259   NA 138 06/14/2021 1350   K 4.1 01/27/2023 0259   CL 94 (L) 01/27/2023 0259   CO2 21 (L) 01/27/2023 0259   GLUCOSE 94 01/27/2023 0259   BUN 49 (H) 01/27/2023 0259   BUN 52 (H) 06/14/2021 1350   CREATININE 11.75 (H) 01/27/2023 0259   CALCIUM 7.9 (L) 01/27/2023 0259   CALCIUM 5.8 (LL) 05/12/2022 0636   PROT 7.2 01/25/2023 0252   PROT 6.7 06/14/2021 1350   ALBUMIN 3.3 (L) 01/27/2023 0259   ALBUMIN 4.1 06/14/2021 1350   AST 12 (L) 01/25/2023 0252   ALT 14 01/25/2023 0252   ALKPHOS 57 01/25/2023 0252   BILITOT 1.7 (H) 01/25/2023 0252   BILITOT 0.3 06/14/2021 1350   GFRNONAA 5 (L) 01/27/2023 0259   GFRAA 64 (L) 04/20/2013 1915   Lipase  No results found for: "LIPASE"  Studies/Results: No results found.  Anti-infectives: Anti-infectives  (From admission, onward)    Start     Dose/Rate Route Frequency Ordered Stop   01/24/23 0600  piperacillin-tazobactam (ZOSYN) IVPB 2.25 g        2.25 g 100 mL/hr over 30 Minutes Intravenous Every 8 hours 01/24/23 0537     01/24/23 0130  piperacillin-tazobactam (ZOSYN) IVPB 3.375 g        3.375 g 100 mL/hr over 30 Minutes Intravenous  Once 01/24/23 0133 01/24/23 0236        Assessment/Plan 57 yo male with ESRD, CHF, and bipolar disease presents with abdominal pain after hydrogen peroxide intake by enema.  - CT w/ circumferential bowel wall thickening of the rectum which was felt to be related to chemical administration/reactive changes  - CT also showed colonic diverticulosis with acute perforated mid to distal sigmoid colon diverticulitis. No organized fluid collection. - HDS - Afebrile. Tachycardia resolved. No hypotension. WBC stable at 13.6. Abdominal exam remains benign. Patient tolerating a soft diet and having bowel function.  - No indication for emergency surgery - Cont abx. Would cover w/ 14 day course given findings of diverticulitis on imaging as well - Nothing per rectum - Will discuss with MD if we continue to follow patient for serial abdominal  exams vs sign off today.  - Would recommend colonoscopy in ~6 weeks.  FEN - Soft diet VTE - SCDs, subcutaneous heparin ID - Zosyn  I reviewed nursing notes, last 24 h vitals and pain scores, last 48 h intake and output, last 24 h labs and trends, and last 24 h imaging results.   LOS: 2 days    Jacinto Halim , [facility] 01/27/2023, 8:16 AM Please see Amion for pager number during day hours 7:00am-4:30pm

## 2023-01-27 NOTE — Progress Notes (Signed)
Received patient in bed.Awake,alert and oriented x 4. Consent verified.  Access used : Left upper arm AVF that worked well.  Medicine given : Heparin 2,400 unit pre run dose.  Duration of treatment 3.5 hours.  Fluid removed 2 liters.  Hemo comment: He quit on his l;ast 30 minutes of treatment ,due to urge to have BM ,refused bedpan when offered.  Hand off to the patient's nurse.

## 2023-01-28 ENCOUNTER — Other Ambulatory Visit (HOSPITAL_COMMUNITY): Payer: Self-pay

## 2023-01-28 DIAGNOSIS — K5720 Diverticulitis of large intestine with perforation and abscess without bleeding: Secondary | ICD-10-CM | POA: Diagnosis not present

## 2023-01-28 LAB — CBC
HCT: 33.2 % — ABNORMAL LOW (ref 39.0–52.0)
Hemoglobin: 11.2 g/dL — ABNORMAL LOW (ref 13.0–17.0)
MCH: 30.9 pg (ref 26.0–34.0)
MCHC: 33.7 g/dL (ref 30.0–36.0)
MCV: 91.5 fL (ref 80.0–100.0)
Platelets: 309 10*3/uL (ref 150–400)
RBC: 3.63 MIL/uL — ABNORMAL LOW (ref 4.22–5.81)
RDW: 14.9 % (ref 11.5–15.5)
WBC: 13.1 10*3/uL — ABNORMAL HIGH (ref 4.0–10.5)
nRBC: 0 % (ref 0.0–0.2)

## 2023-01-28 LAB — GLUCOSE, CAPILLARY
Glucose-Capillary: 106 mg/dL — ABNORMAL HIGH (ref 70–99)
Glucose-Capillary: 143 mg/dL — ABNORMAL HIGH (ref 70–99)
Glucose-Capillary: 159 mg/dL — ABNORMAL HIGH (ref 70–99)
Glucose-Capillary: 99 mg/dL (ref 70–99)

## 2023-01-28 MED ORDER — ISOSORBIDE MONONITRATE ER 60 MG PO TB24
30.0000 mg | ORAL_TABLET | Freq: Every day | ORAL | 0 refills | Status: DC
  Filled 2023-01-28: qty 30, 60d supply, fill #0

## 2023-01-28 MED ORDER — POLYETHYLENE GLYCOL 3350 17 GM/SCOOP PO POWD
17.0000 g | Freq: Every day | ORAL | 0 refills | Status: DC
  Filled 2023-01-28: qty 238, 14d supply, fill #0

## 2023-01-28 MED ORDER — AMOXICILLIN-POT CLAVULANATE 500-125 MG PO TABS
1.0000 | ORAL_TABLET | Freq: Two times a day (BID) | ORAL | 0 refills | Status: AC
  Filled 2023-01-28: qty 28, 14d supply, fill #0

## 2023-01-28 NOTE — Discharge Summary (Signed)
Physician Discharge Summary   Patient: Paul [name] MRN: [id number] DOB: [birth year]  Admit date:     01/23/2023  Discharge date: 01/28/23  Discharge Physician: Alba Cory   PCP: Marcine Matar, MD   Recommendations at discharge:    Needs Follow up with GI for colonoscopy in 8 weeks.  Follow up with PCP for resolution of diverticulitis.   Discharge Diagnoses: Principal Problem:   Ingestion of caustic substance Active Problems:   Essential hypertension   Schizophrenia (HCC)   Bipolar disorder (HCC)   Chronic combined systolic and diastolic CHF (congestive heart failure) (HCC)   Mixed hyperlipidemia   Hypocalcemia   Perforated diverticulum   ESRD (end stage renal disease) (HCC)  Resolved Problems:   * No resolved hospital problems. *  Hospital Course: 57 year old with past medical history significant for bipolar disorder, schizophrenia, hypertension, ESRD (MWF) heart failure reduced ejection fraction presents with abdominal pain.  Patient relates that he wanted to do with cleanse.  He ingested 4 ounces of hydroxy peroxide orally and use a small amount of hydroperoxide  to make an enema to clean his colon.  After injection he developed abdominal pain, nausea and presented to the ED for further evaluation.   Evaluation in the ED he was found to be hypertensive systolic blood pressure 194, potassium 5.7, white blood cell 14, CT head normal.  CT abdomen and pelvis showed gas initially thought to be perforation however radiologist added a correction: "Free intraperitoneal gas along the sigmoid and its mesentery, mesenteric venous gas is more likely in the setting of given history of hydrogen peroxide ingestion p.o. or rectal.  Perforated acute diverticulitis is not excluded but less likely.  Concern for sigmoiditis and proctitis with mesenteric venous gas related to hydrogen peroxide injection rectal administration.  No free intraperitoneal fluid".      Assessment and  Plan: 1-Caustic Ingestion hydrogen peroxide -Case was discussed with poison control, recommended monitoring for air emboli, monitor for headaches or chest pain.  Minimal in 14 hours observation. -patient denies chest pain, headaches.  -he was trying to cleanse his colon. He denies trying to harm himself.  Abdominal pain improved. Diet advanced to soft. Tolerating diet.     Possible Sigmoiditis and proctitis with mesenteric venous gas related to hydrogen peroxide injection rectal administration. Diverticulitis.  Bloody stool/ less amount per patient. Hb stable.  -Surgery Following.  -IV Zosyn.  -IV Protonix.  -Diet advance to Soft.  -WBC trending down 16--14-14--13. -eating more. Tolerating soft diet.  Plan to discharge home on Augmentin for 2 weeks.  Needs colonoscopy in 8 weeks.    Bipolar, Schizophrenia: Not on medications.  Answer questions appropriately.    ESRD hyperkalemia, metabolic acidosis.  S/P Calcium gluconate, amp bicarb, insulin amp D 50  Nephrology consulted for HD.  Hyperkalemia resolved.  Had HD    Chronic combined systolic and diastolic heart failure Volume manage with HD.    Essential hypertension Hydralazine.  Resume carvedilol/    Mixed hyperlipidemia:             Estimated body mass index is 28.35 kg/m as calculated from the following:   Height as of 11/29/22: 5\' 3"  (1.6 m).   Weight as of this encounter: 72.6 kg.        Consultants: Surgery, nephrology  Procedures performed: None Disposition: Home Diet recommendation:  Discharge Diet Orders (From admission, onward)     Start     Ordered   01/28/23 0000  Diet - low  sodium heart healthy        01/28/23 1033           Cardiac diet DISCHARGE MEDICATION: Allergies as of 01/28/2023   No Active Allergies      Medication List     TAKE these medications    amoxicillin-clavulanate 500-125 MG tablet Commonly known as: AUGMENTIN Take 1 tablet by mouth 2 (two) times daily for  14 days.   carvedilol 6.25 MG tablet Commonly known as: COREG Take 1 tablet (6.25 mg total) by mouth 2 (two) times daily with a meal. What changed: Another medication with the same name was removed. Continue taking this medication, and follow the directions you see here.   isosorbide mononitrate 60 MG 24 hr tablet Commonly known as: IMDUR Take 0.5 tablets (30 mg total) by mouth daily. What changed: how much to take   lidocaine-prilocaine cream Commonly known as: EMLA Apply 1 a small amount to skin three times a week apply to AVF 30-60 min prior to dialysis, wrap site w plastic wrap   polyethylene glycol powder 17 GM/SCOOP powder Commonly known as: GLYCOLAX/MIRALAX Take 17 g by mouth daily. Start taking on: January 29, 2023   rosuvastatin 5 MG tablet Commonly known as: Crestor Take 1 tablet by mouth once a day   Velphoro 500 MG chewable tablet Generic drug: sucroferric oxyhydroxide Chew 2 tablets (1,000 mg total) by mouth with breakfast, with lunch, and with evening meal.        Follow-up Information     Marcine Matar, MD Follow up in 1 week(s).   Specialty: Internal Medicine Contact information: [address] [place] [address] [phone]                Discharge Exam: Ceasar Mons Weights   01/24/23 1037 01/24/23 1431 01/27/23 0846  Weight: 75.6 kg 74.6 kg 72.6 kg   General; NAD  Condition at discharge: stable  The results of significant diagnostics from this hospitalization (including imaging, microbiology, ancillary and laboratory) are listed below for reference.   Imaging Studies: CT ABDOMEN PELVIS W CONTRAST  Addendum Date: 01/24/2023   ADDENDUM REPORT: 01/24/2023 01:18 ADDENDUM: Given distribution of the previously mentioned "free intraperitoneal gas" along the sigmoid colon and its mesentery, mesenteric venous gas is more likely in the setting of given history: hydrogen peroxide ingestion PO and rectal. Perforated acute  diverticulitis is not excluded but less likely (7:65.) Concern for sigmoiditis and proctitis with mesenteric venous gas related to hydrogen peroxide ingestion/rectal administration. No pneumatosis identified. No free intraperitoneal fluid. No portal venous gas identified. Recommend colonoscopy status post treatment and status post complete resolution of inflammatory changes to exclude an underlying lesion. These results were called by telephone at the time of interpretation on 01/24/2023 at 1:03 am to provider Dr. Pilar Plate, who verbally acknowledged these results. Electronically Signed   By: Tish Frederickson M.D.   On: 01/24/2023 01:18   Result Date: 01/24/2023 CLINICAL DATA:  RUQ PAIN, HYDROGEN PEROXIDE INGESTION EXAM: CT ABDOMEN AND PELVIS WITH CONTRAST TECHNIQUE: Multidetector CT imaging of the abdomen and pelvis was performed using the standard protocol following bolus administration of intravenous contrast. RADIATION DOSE REDUCTION: This exam was performed according to the departmental dose-optimization program which includes automated exposure control, adjustment of the mA and/or kV according to patient size and/or use of iterative reconstruction technique. CONTRAST:  60mL OMNIPAQUE IOHEXOL 350 MG/ML SOLN COMPARISON:  None Available. FINDINGS: Lower chest: Prominent heart size.  No acute abnormality. Hepatobiliary: No focal  liver abnormality. Calcified gallstone noted within the gallbladder lumen. No gallbladder wall thickening or pericholecystic fluid. No biliary dilatation. Pancreas: No focal lesion. Normal pancreatic contour. No surrounding inflammatory changes. No main pancreatic ductal dilatation. Spleen: Normal in size without focal abnormality. Adrenals/Urinary Tract: No adrenal nodule bilaterally. Bilateral atrophic kidneys. Bilateral kidneys enhance symmetrically. No hydronephrosis. No hydroureter. Limited evaluation of the urinary bladder lumen due to streak artifact originating from bilateral femoral  surgical hardware. The urinary bladder is unremarkable. Stomach/Bowel: Stomach is within normal limits. No evidence of small bowel wall thickening or dilatation. Mid to distal sigmoid bowel wall thickening with associated mild pericolonic fat stranding and free foci of gas scattered along the sigmoid colon, pelvis and mid abdomen. No intramural abscess formation. Circumferential bowel thickening of the rectum. Limited evaluation of the rectum due to streak artifact originating from bilateral femoral surgical hardware.Appendix appears normal. Vascular/Lymphatic: No abdominal aorta or iliac aneurysm. Mild atherosclerotic plaque of the aorta and its branches. No abdominal, pelvic, or inguinal lymphadenopathy. Reproductive: Limited evaluation due to streak artifact originating from bilateral femoral surgical hardware. Other: No intraperitoneal free fluid. Abdomen and pelvis positive for free intraperitoneal gas grossly scattered throughout the mesentery of the sigmoid colon but also noted within the mid abdomen and pelvis. No organized fluid collection. Musculoskeletal: No abdominal wall hernia or abnormality. No suspicious lytic or blastic osseous lesions. No acute displaced fracture. Multilevel severe degenerative changes of the spine. Bilateral total hip arthroplasty. IMPRESSION: 1. Colonic diverticulosis with acute perforated mid to distal sigmoid colon diverticulitis. No organized fluid collection. Recommend colonoscopy status post treatment and status post complete resolution of inflammatory changes to exclude an underlying lesion. 2. Circumferential bowel wall thickening of the rectum which may represent reactive changes versus proctitis. Limited evaluation of the rectum due to streak artifact originating from bilateral femoral surgical hardware. 3. Cholelithiasis with no CT finding of acute cholecystitis. 4. Atrophic kidneys. 5.  Aortic Atherosclerosis (ICD10-I70.0). Electronically Signed: By: Tish Frederickson  M.D. On: 01/24/2023 00:59   CT HEAD WO CONTRAST ( )  Result Date: 01/24/2023 CLINICAL DATA:  Delirium EXAM: CT HEAD WITHOUT CONTRAST TECHNIQUE: Contiguous axial images were obtained from the base of the skull through the vertex without intravenous contrast. RADIATION DOSE REDUCTION: This exam was performed according to the departmental dose-optimization program which includes automated exposure control, adjustment of the mA and/or kV according to patient size and/or use of iterative reconstruction technique. COMPARISON:  CT head 04/20/2013, MRI head 09/11/2011 FINDINGS: Brain: Patchy and confluent areas of decreased attenuation are noted throughout the deep and periventricular white matter of the cerebral hemispheres bilaterally, compatible with chronic microvascular ischemic disease. No evidence of large-territorial acute infarction. No parenchymal hemorrhage. No mass lesion. No extra-axial collection. No mass effect or midline shift. No hydrocephalus. Basilar cisterns are patent. Vascular: No hyperdense vessel. Skull: No acute fracture or focal lesion. Sinuses/Orbits: Bilateral maxillary sinus mucosal thickening. Otherwise paranasal sinuses and mastoid air cells are clear. 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 The right orbit is unremarkable. Other: None. IMPRESSION: No acute intracranial abnormality. Electronically Signed   By: Tish Frederickson M.D.   On: 01/24/2023 01:01   DG Chest Portable 1 View  Result Date: 01/23/2023 CLINICAL DATA:  Ingestion. EXAM: PORTABLE CHEST 1 VIEW COMPARISON:  Chest x-ray 05/11/2022 FINDINGS: Heart is enlarged, unchanged. There central pulmonary vascular congestion. There is no radiopaque foreign body. The lungs and costophrenic angles are clear. There is no pneumothorax or acute fracture. IMPRESSION: 1. Cardiomegaly with central pulmonary vascular congestion. 2.  No radiopaque foreign body. Electronically Signed   By: Darliss Cheney M.D.   On: 01/23/2023 23:45     Microbiology: Results for orders placed or performed during the hospital encounter of 06/01/21  Resp Panel by RT-PCR (Flu A&B, Covid) Nasopharyngeal Swab     Status: None   Collection Time: 06/02/21  2:22 AM   Specimen: Nasopharyngeal Swab; Nasopharyngeal(NP) swabs in vial transport medium  Result Value Ref Range Status   SARS Coronavirus 2 by RT PCR NEGATIVE NEGATIVE Final    Comment: (NOTE) SARS-CoV-2 target nucleic acids are NOT DETECTED.  The SARS-CoV-2 RNA is generally detectable in upper respiratory specimens during the acute phase of infection. The lowest concentration of SARS-CoV-2 viral copies this assay can detect is 138 copies/mL. A negative result does not preclude SARS-Cov-2 infection and should not be used as the sole basis for treatment or other patient management decisions. A negative result may occur with  improper specimen collection/handling, submission of specimen other than nasopharyngeal swab, presence of viral mutation(s) within the areas targeted by this assay, and inadequate number of viral copies(<138 copies/mL). A negative result must be combined with clinical observations, patient history, and epidemiological information. The expected result is Negative.  Fact Sheet for Patients:  BloggerCourse.com  Fact Sheet for Healthcare Providers:  SeriousBroker.it  This test is no t yet approved or cleared by the [place] FDA and  has been authorized for detection and/or diagnosis of SARS-CoV-2 by FDA under an Emergency Use Authorization (EUA). This EUA will remain  in effect (meaning this test can be used) for the duration of the COVID-19 declaration under Section 564(b)(1) of the Act, 21 U.S.C.section 360bbb-3(b)(1), unless the authorization is terminated  or revoked sooner.       Influenza A by PCR NEGATIVE NEGATIVE Final   Influenza B by PCR NEGATIVE NEGATIVE Final    Comment: (NOTE) The Xpert  Xpress SARS-CoV-2/FLU/RSV plus assay is intended as an aid in the diagnosis of influenza from Nasopharyngeal swab specimens and should not be used as a sole basis for treatment. Nasal washings and aspirates are unacceptable for Xpert Xpress SARS-CoV-2/FLU/RSV testing.  Fact Sheet for Patients: BloggerCourse.com  Fact Sheet for Healthcare Providers: SeriousBroker.it  This test is not yet approved or cleared by the [place] FDA and has been authorized for detection and/or diagnosis of SARS-CoV-2 by FDA under an Emergency Use Authorization (EUA). This EUA will remain in effect (meaning this test can be used) for the duration of the COVID-19 declaration under Section 564(b)(1) of the Act, 21 U.S.C. section 360bbb-3(b)(1), unless the authorization is terminated or revoked.  Performed at [facility], [address], [place], [place] [address]   MRSA Next Gen by PCR, Nasal     Status: None   Collection Time: 06/02/21  3:51 PM   Specimen: Nasal Mucosa; Nasal Swab  Result Value Ref Range Status   MRSA by PCR Next Gen NOT DETECTED NOT DETECTED Final    Comment: (NOTE) The GeneXpert MRSA Assay (FDA approved for NASAL specimens only), is one component of a comprehensive MRSA colonization surveillance program. It is not intended to diagnose MRSA infection nor to guide or monitor treatment for MRSA infections. Test performance is not FDA approved in patients less than 26 years old. Performed at [facility], [address], [place], [place] [address]     Labs: CBC: Recent Labs  Lab 01/23/23 [phone] 01/23/23 2340 01/24/23 1914 01/25/23 0252 01/26/23 0308 01/27/23 0259 01/28/23 1106  WBC 14.8*  --  16.6* 14.4* 13.2* 13.6* 13.1*  NEUTROABS 13.2*  --  15.2* 12.1*  --   --   --   HGB 10.5*   < > 11.1* 10.7* 10.8* 10.5* 11.2*  HCT 30.9*   < > 31.0* 29.9* 31.9* 31.0* 33.2*  MCV 90.1  --  89.3 90.9 90.9 92.3 91.5  PLT  247  --  205 205 210 228 309   < > = values in this interval not displayed.   Basic Metabolic Panel: Recent Labs  Lab 01/23/23 2334 01/23/23 2340 01/24/23 0713 01/24/23 1602 01/25/23 0252 01/25/23 1300 01/26/23 1325 01/27/23 0259  NA 137 135  135 136  --  135  --  134* 134*  K 5.8* 5.7*  5.7* 6.6* 3.0* 3.9  --  4.3 4.1  CL 98 104 98  --  97*  --  91* 94*  CO2 17*  --  16*  --  23  --  23 21*  GLUCOSE 108* 100* 158*  --  88  --  108* 94  BUN 90* 101* 96*  --  38*  --  45* 49*  CREATININE 11.65* 12.70* 12.18*  --  7.65*  --  10.38* 11.75*  CALCIUM 8.3*  --  8.0*  --  8.2*  --  7.7* 7.9*  MG  --   --   --   --  1.8  --   --   --   PHOS  --   --   --   --   --  5.8* 7.0* 7.3*   Liver Function Tests: Recent Labs  Lab 01/23/23 2334 01/24/23 0713 01/25/23 0252 01/26/23 1325 01/27/23 0259  AST 13* 12* 12*  --   --   ALT 19 16 14   --   --   ALKPHOS 54 48 57  --   --   BILITOT 1.2 1.6* 1.7*  --   --   PROT 8.0 7.5 7.2  --   --   ALBUMIN 4.3 3.8 3.6 3.4* 3.3*   CBG: Recent Labs  Lab 01/27/23 2105 01/28/23 0041 01/28/23 0504 01/28/23 0734 01/28/23 1129  GLUCAP 123* 159* 99 106* 143*    Discharge time spent: greater than 30 minutes.  Signed: Alba Cory, MD [facility] 01/28/2023

## 2023-01-28 NOTE — Progress Notes (Signed)
[facility] Progress Note   Subjective: Completed dialysis yesterday with no issues. Feels better today. Plans for discharge.   Objective Vitals:   01/27/23 1517 01/27/23 2103 01/28/23 0448 01/28/23 0733  BP: 114/60 137/76 125/76 125/78  Pulse:  (!) 103 88 82  Resp: 16 18 18 17   Temp: 98.6 F (37 C) 98.7 F (37.1 C) 98.2 F (36.8 C)   TempSrc: Oral Oral Oral   SpO2: 98% 98% 98% 95%  Weight:          Additional Objective Labs: Basic Metabolic Panel: Recent Labs  Lab 01/25/23 0252 01/25/23 1300 01/26/23 1325 01/27/23 0259  NA 135  --  134* 134*  K 3.9  --  4.3 4.1  CL 97*  --  91* 94*  CO2 23  --  23 21*  GLUCOSE 88  --  108* 94  BUN 38*  --  45* 49*  CREATININE 7.65*  --  10.38* 11.75*  CALCIUM 8.2*  --  7.7* 7.9*  PHOS  --  5.8* 7.0* 7.3*   CBC: Recent Labs  Lab 01/23/23 2334 01/23/23 2340 01/24/23 0713 01/25/23 0252 01/26/23 0308 01/27/23 0259  WBC 14.8*  --  16.6* 14.4* 13.2* 13.6*  NEUTROABS 13.2*  --  15.2* 12.1*  --   --   HGB 10.5*   < > 11.1* 10.7* 10.8* 10.5*  HCT 30.9*   < > 31.0* 29.9* 31.9* 31.0*  MCV 90.1  --  89.3 90.9 90.9 92.3  PLT 247  --  205 205 210 228   < > = values in this interval not displayed.   Blood Culture No results found for: "SDES", "SPECREQUEST", "CULT", "REPTSTATUS"   Physical Exam General: Well appering, nad Heart: RRR Lungs: Clear, normal wob Abdomen: soft - non tender Extremities: No LE edema  Dialysis Access: RUE AVF +t/b  Medications:  piperacillin-tazobactam (ZOSYN)  IV 2.25 g (01/28/23 0610)    calcitRIOL  0.25 mcg Oral Q M,W,F   carvedilol  6.25 mg Oral BID WC   Chlorhexidine Gluconate Cloth  6 each Topical Q0600   Chlorhexidine Gluconate Cloth  6 each Topical Q0600   heparin  5,000 Units Subcutaneous Q8H   hydrALAZINE  10 mg Intravenous Q6H   pantoprazole (PROTONIX) IV  40 mg Intravenous Q12H   polyethylene glycol  17 g Oral Daily   sucroferric oxyhydroxide  1,000 mg Oral TID WC      OP HD: MWF G-O  4h  400/600  75.4kg  2K/3Ca bath   RUA AVF  Heparin 2400 - last OP HD 9/16, post wt 74.7kg - coming off 0.5kg under dry wt; dry wt up and down last 3 wks - rocaltrol 0.25 mcg po three times per week - mircera 75 mcg IV q 2 wks, last 9/04, due 9/18 (pt missed)    CXR 9/19 - vasc congestion, no edema     Assessment/ Plan: Caustic ingestion of H2O2. CT abd showed prob mesenteric venous gas along the sigmoid colon. Symptoms resolved with observation. Tolerating diet.  Hyperkalemia - resolved after HD Friday.  ESRD - on HD MWF. Back on schedule. Next HD 9/25  HTN - bp's good, cont home meds Volume - euvolemic on exam, close to dry wt. Minimal UF goal next HD.  Anemia esrd - Hb 10-11 here, missed recent esa dose. No need for esa at this time.  MBD ckd - CCa in range, and phos is in range. Cont po vdra and binder.  Bipolar d/o  and schizophrenia     Tomasa Blase PA-C [facility] 01/28/2023,11:47 AM

## 2023-01-28 NOTE — Plan of Care (Signed)

## 2023-01-28 NOTE — Progress Notes (Signed)
D/C order noted. Contacted [facility] to advise clinic of pt's d/c today and that pt should resume care tomorrow.   Erinne Gillentine Renal Navigator [phone] 

## 2023-01-28 NOTE — Progress Notes (Signed)
Pt requesting bus pass. Bus pass from RN CM provided to pt.

## 2023-01-28 NOTE — Plan of Care (Signed)
[facility] Patient Discharge Orders- [facility] CLINIC: GOC  Patient's name: Paul [name] Admit/DC Dates: 01/23/2023 - 01/28/2023  Discharge Diagnoses: Ingestion of caustic substance H2O2 Sigmoiditis/diverticulitis following H2O2 administration. Completing Augmentin course   Aranesp: Given: --   Date and amount of last dose: --  Last Hgb: 11.2 PRBC's Given: -- Date/# of units: -- ESA dose for discharge: mircera 75 mcg IV q 2 weeks  IV Iron dose at discharge: --  Heparin change: --  EDW Change: --   Bath Change: --  Access intervention/Change: --   Hectorol/Calcitriol change: --  Discharge Labs: Calcium 7.7 Phosphorus 7.0 Albumin 3.4 K+ 4.3  IV Antibiotics: ---   On Coumadin: --    OTHER/APPTS/LAB ORDERS:    D/C Meds to be reconciled by nurse after every discharge.  Completed By: Tomasa Blase PA-C   Reviewed by: MD:______ RN_______

## 2023-01-28 NOTE — Progress Notes (Signed)
AVS given and reviewed with pt. Medications delivered to bedside from [facility] pharmacy and discussed with pt. All questions answered to satisfaction. Pt verbalized understanding of information given. Pt to be escorted off the unit with all belongings via wheelchair by staff member.

## 2023-01-29 DIAGNOSIS — N186 End stage renal disease: Secondary | ICD-10-CM | POA: Diagnosis not present

## 2023-01-29 DIAGNOSIS — N2581 Secondary hyperparathyroidism of renal origin: Secondary | ICD-10-CM | POA: Diagnosis not present

## 2023-01-29 DIAGNOSIS — K5793 Diverticulitis of intestine, part unspecified, without perforation or abscess with bleeding: Secondary | ICD-10-CM | POA: Diagnosis not present

## 2023-01-29 DIAGNOSIS — Z992 Dependence on renal dialysis: Secondary | ICD-10-CM | POA: Diagnosis not present

## 2023-01-30 ENCOUNTER — Telehealth: Payer: Self-pay | Admitting: Nephrology

## 2023-01-30 ENCOUNTER — Telehealth: Payer: Self-pay

## 2023-01-30 NOTE — Transitions of Care (Post Inpatient/ED Visit) (Signed)
01/30/2023  Name: Paul [name] MRN: [id number] DOB: December 29, 1965  Today's TOC FU Call Status: Today's TOC FU Call Status:: Unsuccessful Call (1st Attempt) Unsuccessful Call (1st Attempt) Date: 01/30/23  Attempted to reach the patient regarding the most recent Inpatient/ED visit.  Follow Up Plan: Additional outreach attempts will be made to reach the patient to complete the Transitions of Care (Post Inpatient/ED visit) call.   Signature Robyne Peers, RN

## 2023-01-30 NOTE — Telephone Encounter (Signed)
Transition of care contact from inpatient facility  Date of Discharge: 01/28/23 Date of Contact: 01/30/23  Method of contact: Phone  Attempted to contact patient to discuss transition of care from inpatient admission. Patient did not answer the phone. Will try to reach at outpatient dialysis.

## 2023-01-31 DIAGNOSIS — N2581 Secondary hyperparathyroidism of renal origin: Secondary | ICD-10-CM | POA: Diagnosis not present

## 2023-01-31 DIAGNOSIS — Z992 Dependence on renal dialysis: Secondary | ICD-10-CM | POA: Diagnosis not present

## 2023-01-31 DIAGNOSIS — N186 End stage renal disease: Secondary | ICD-10-CM | POA: Diagnosis not present

## 2023-01-31 DIAGNOSIS — K5793 Diverticulitis of intestine, part unspecified, without perforation or abscess with bleeding: Secondary | ICD-10-CM | POA: Diagnosis not present

## 2023-02-03 ENCOUNTER — Telehealth: Payer: Self-pay

## 2023-02-03 DIAGNOSIS — N186 End stage renal disease: Secondary | ICD-10-CM | POA: Diagnosis not present

## 2023-02-03 DIAGNOSIS — Z992 Dependence on renal dialysis: Secondary | ICD-10-CM | POA: Diagnosis not present

## 2023-02-03 DIAGNOSIS — I129 Hypertensive chronic kidney disease with stage 1 through stage 4 chronic kidney disease, or unspecified chronic kidney disease: Secondary | ICD-10-CM | POA: Diagnosis not present

## 2023-02-03 DIAGNOSIS — N2581 Secondary hyperparathyroidism of renal origin: Secondary | ICD-10-CM | POA: Diagnosis not present

## 2023-02-03 DIAGNOSIS — K5793 Diverticulitis of intestine, part unspecified, without perforation or abscess with bleeding: Secondary | ICD-10-CM | POA: Diagnosis not present

## 2023-02-03 NOTE — Transitions of Care (Post Inpatient/ED Visit) (Signed)
02/03/2023  Name: Paul [name] MRN: [id number] DOB: 01-31-1966  Today's TOC FU Call Status: Today's TOC FU Call Status:: Unsuccessful Call (2nd Attempt) Unsuccessful Call (1st Attempt) Date: 01/30/23 Unsuccessful Call (2nd Attempt) Date: 02/03/23  Attempted to reach the patient regarding the most recent Inpatient/ED visit.  Follow Up Plan: Additional outreach attempts will be made to reach the patient to complete the Transitions of Care (Post Inpatient/ED visit) call.   Signature Robyne Peers, RN

## 2023-02-04 ENCOUNTER — Telehealth: Payer: Self-pay

## 2023-02-04 NOTE — Transitions of Care (Post Inpatient/ED Visit) (Signed)
02/04/2023  Name: Paul [name] MRN: [id number] DOB: 06-02-1965  Today's TOC FU Call Status: Today's TOC FU Call Status:: Successful TOC FU Call Completed Unsuccessful Call (1st Attempt) Date: 01/30/23 Unsuccessful Call (2nd Attempt) Date: 02/03/23 [facility] FU Call Complete Date: 02/04/23 Patient's Name and Date of Birth confirmed.  Transition Care Management Follow-up Telephone Call Date of Discharge: 02/27/23 Discharge Facility: [facility]) Type of Discharge: Inpatient Admission Primary Inpatient Discharge Diagnosis:: ingestion of caustic substance How have you been since you were released from the hospital?: Better Any questions or concerns?: Yes Patient Questions/Concerns:: He said he has been doing research and believes that he needs to clean out his colon to improve his overall health and he  is considering ordering supplements.  I instructed him to please discuss his thoughts with Dr Laural Benes prior to ordering /taking anything new. Patient Questions/Concerns Addressed: Notified Provider of Patient Questions/Concerns  Items Reviewed: Did you receive and understand the discharge instructions provided?: Yes Medications obtained,verified, and reconciled?:  (He said he has all of his medications and did not have any questions about the med regime.  He was at [employer] at the time of this call.) Any new allergies since your discharge?: No Dietary orders reviewed?: No Do you have support at home?: Yes People in Home: facility resident Name of Support/Comfort Primary Source: currently staying at a sober house.  Medications Reviewed Today: Medications Reviewed Today   Medications were not reviewed in this encounter     Home Care and Equipment/Supplies: Were Home Health Services Ordered?: No Any new equipment or medical supplies ordered?: No  Functional Questionnaire: Do you need assistance with bathing/showering or dressing?: No Do you need assistance with meal  preparation?: No Do you need assistance with eating?: No Do you have difficulty maintaining continence: No Do you need assistance with getting out of bed/getting out of a chair/moving?: No Do you have difficulty managing or taking your medications?: No  Follow up appointments reviewed: PCP Follow-up appointment confirmed?: No (he said he will call to schedule an appointment) MD Provider Line [phone] Given: No Specialist Hospital Follow-up appointment confirmed?: No Reason Specialist Follow-Up Not Confirmed:  (referral was placed when he was discharged) Do you need transportation to your follow-up appointment?: No Do you understand care options if your condition(s) worsen?: Yes-patient verbalized understanding    SIGNATURE Robyne Peers, RN

## 2023-02-04 NOTE — Telephone Encounter (Signed)
From [facility] call:  He said he has been doing research and believes that he needs to clean out his colon to improve his overall health and he is considering ordering supplements. I instructed him to please discuss his thoughts with Dr Laural Benes prior to ordering /taking anything new.      He was at [employer] when I called and said he would call back to schedule an appointment with Dr Laural Benes.

## 2023-02-05 DIAGNOSIS — N186 End stage renal disease: Secondary | ICD-10-CM | POA: Diagnosis not present

## 2023-02-05 DIAGNOSIS — Z992 Dependence on renal dialysis: Secondary | ICD-10-CM | POA: Diagnosis not present

## 2023-02-05 DIAGNOSIS — N2581 Secondary hyperparathyroidism of renal origin: Secondary | ICD-10-CM | POA: Diagnosis not present

## 2023-02-07 DIAGNOSIS — Z992 Dependence on renal dialysis: Secondary | ICD-10-CM | POA: Diagnosis not present

## 2023-02-07 DIAGNOSIS — N186 End stage renal disease: Secondary | ICD-10-CM | POA: Diagnosis not present

## 2023-02-07 DIAGNOSIS — N2581 Secondary hyperparathyroidism of renal origin: Secondary | ICD-10-CM | POA: Diagnosis not present

## 2023-02-10 DIAGNOSIS — N2581 Secondary hyperparathyroidism of renal origin: Secondary | ICD-10-CM | POA: Diagnosis not present

## 2023-02-10 DIAGNOSIS — N186 End stage renal disease: Secondary | ICD-10-CM | POA: Diagnosis not present

## 2023-02-10 DIAGNOSIS — Z992 Dependence on renal dialysis: Secondary | ICD-10-CM | POA: Diagnosis not present

## 2023-02-12 DIAGNOSIS — N186 End stage renal disease: Secondary | ICD-10-CM | POA: Diagnosis not present

## 2023-02-12 DIAGNOSIS — Z992 Dependence on renal dialysis: Secondary | ICD-10-CM | POA: Diagnosis not present

## 2023-02-12 DIAGNOSIS — N2581 Secondary hyperparathyroidism of renal origin: Secondary | ICD-10-CM | POA: Diagnosis not present

## 2023-02-14 DIAGNOSIS — N2581 Secondary hyperparathyroidism of renal origin: Secondary | ICD-10-CM | POA: Diagnosis not present

## 2023-02-14 DIAGNOSIS — N186 End stage renal disease: Secondary | ICD-10-CM | POA: Diagnosis not present

## 2023-02-14 DIAGNOSIS — Z992 Dependence on renal dialysis: Secondary | ICD-10-CM | POA: Diagnosis not present

## 2023-02-17 ENCOUNTER — Other Ambulatory Visit (HOSPITAL_COMMUNITY): Payer: Self-pay

## 2023-02-17 DIAGNOSIS — Z992 Dependence on renal dialysis: Secondary | ICD-10-CM | POA: Diagnosis not present

## 2023-02-17 DIAGNOSIS — N186 End stage renal disease: Secondary | ICD-10-CM | POA: Diagnosis not present

## 2023-02-17 DIAGNOSIS — N2581 Secondary hyperparathyroidism of renal origin: Secondary | ICD-10-CM | POA: Diagnosis not present

## 2023-02-19 DIAGNOSIS — Z992 Dependence on renal dialysis: Secondary | ICD-10-CM | POA: Diagnosis not present

## 2023-02-19 DIAGNOSIS — N2581 Secondary hyperparathyroidism of renal origin: Secondary | ICD-10-CM | POA: Diagnosis not present

## 2023-02-19 DIAGNOSIS — N186 End stage renal disease: Secondary | ICD-10-CM | POA: Diagnosis not present

## 2023-02-21 DIAGNOSIS — N2581 Secondary hyperparathyroidism of renal origin: Secondary | ICD-10-CM | POA: Diagnosis not present

## 2023-02-21 DIAGNOSIS — N186 End stage renal disease: Secondary | ICD-10-CM | POA: Diagnosis not present

## 2023-02-21 DIAGNOSIS — Z992 Dependence on renal dialysis: Secondary | ICD-10-CM | POA: Diagnosis not present

## 2023-02-24 DIAGNOSIS — N186 End stage renal disease: Secondary | ICD-10-CM | POA: Diagnosis not present

## 2023-02-24 DIAGNOSIS — Z992 Dependence on renal dialysis: Secondary | ICD-10-CM | POA: Diagnosis not present

## 2023-02-24 DIAGNOSIS — N2581 Secondary hyperparathyroidism of renal origin: Secondary | ICD-10-CM | POA: Diagnosis not present

## 2023-02-26 DIAGNOSIS — N2581 Secondary hyperparathyroidism of renal origin: Secondary | ICD-10-CM | POA: Diagnosis not present

## 2023-02-26 DIAGNOSIS — N186 End stage renal disease: Secondary | ICD-10-CM | POA: Diagnosis not present

## 2023-02-26 DIAGNOSIS — Z992 Dependence on renal dialysis: Secondary | ICD-10-CM | POA: Diagnosis not present

## 2023-02-28 DIAGNOSIS — N186 End stage renal disease: Secondary | ICD-10-CM | POA: Diagnosis not present

## 2023-02-28 DIAGNOSIS — Z992 Dependence on renal dialysis: Secondary | ICD-10-CM | POA: Diagnosis not present

## 2023-02-28 DIAGNOSIS — N2581 Secondary hyperparathyroidism of renal origin: Secondary | ICD-10-CM | POA: Diagnosis not present

## 2023-03-03 DIAGNOSIS — Z992 Dependence on renal dialysis: Secondary | ICD-10-CM | POA: Diagnosis not present

## 2023-03-03 DIAGNOSIS — N186 End stage renal disease: Secondary | ICD-10-CM | POA: Diagnosis not present

## 2023-03-03 DIAGNOSIS — N2581 Secondary hyperparathyroidism of renal origin: Secondary | ICD-10-CM | POA: Diagnosis not present

## 2023-03-05 DIAGNOSIS — N186 End stage renal disease: Secondary | ICD-10-CM | POA: Diagnosis not present

## 2023-03-05 DIAGNOSIS — Z992 Dependence on renal dialysis: Secondary | ICD-10-CM | POA: Diagnosis not present

## 2023-03-05 DIAGNOSIS — N2581 Secondary hyperparathyroidism of renal origin: Secondary | ICD-10-CM | POA: Diagnosis not present

## 2023-03-06 DIAGNOSIS — Z992 Dependence on renal dialysis: Secondary | ICD-10-CM | POA: Diagnosis not present

## 2023-03-06 DIAGNOSIS — N186 End stage renal disease: Secondary | ICD-10-CM | POA: Diagnosis not present

## 2023-03-06 DIAGNOSIS — I129 Hypertensive chronic kidney disease with stage 1 through stage 4 chronic kidney disease, or unspecified chronic kidney disease: Secondary | ICD-10-CM | POA: Diagnosis not present

## 2023-03-07 DIAGNOSIS — N186 End stage renal disease: Secondary | ICD-10-CM | POA: Diagnosis not present

## 2023-03-07 DIAGNOSIS — Z992 Dependence on renal dialysis: Secondary | ICD-10-CM | POA: Diagnosis not present

## 2023-03-07 DIAGNOSIS — N2581 Secondary hyperparathyroidism of renal origin: Secondary | ICD-10-CM | POA: Diagnosis not present

## 2023-03-11 DIAGNOSIS — I871 Compression of vein: Secondary | ICD-10-CM | POA: Diagnosis not present

## 2023-03-11 DIAGNOSIS — Z992 Dependence on renal dialysis: Secondary | ICD-10-CM | POA: Diagnosis not present

## 2023-03-11 DIAGNOSIS — T82868A Thrombosis of vascular prosthetic devices, implants and grafts, initial encounter: Secondary | ICD-10-CM | POA: Diagnosis not present

## 2023-03-11 DIAGNOSIS — N186 End stage renal disease: Secondary | ICD-10-CM | POA: Diagnosis not present

## 2023-03-12 DIAGNOSIS — N186 End stage renal disease: Secondary | ICD-10-CM | POA: Diagnosis not present

## 2023-03-12 DIAGNOSIS — N2581 Secondary hyperparathyroidism of renal origin: Secondary | ICD-10-CM | POA: Diagnosis not present

## 2023-03-12 DIAGNOSIS — Z992 Dependence on renal dialysis: Secondary | ICD-10-CM | POA: Diagnosis not present

## 2023-03-14 DIAGNOSIS — Z992 Dependence on renal dialysis: Secondary | ICD-10-CM | POA: Diagnosis not present

## 2023-03-14 DIAGNOSIS — N2581 Secondary hyperparathyroidism of renal origin: Secondary | ICD-10-CM | POA: Diagnosis not present

## 2023-03-14 DIAGNOSIS — N186 End stage renal disease: Secondary | ICD-10-CM | POA: Diagnosis not present

## 2023-03-17 DIAGNOSIS — N186 End stage renal disease: Secondary | ICD-10-CM | POA: Diagnosis not present

## 2023-03-17 DIAGNOSIS — Z992 Dependence on renal dialysis: Secondary | ICD-10-CM | POA: Diagnosis not present

## 2023-03-17 DIAGNOSIS — N2581 Secondary hyperparathyroidism of renal origin: Secondary | ICD-10-CM | POA: Diagnosis not present

## 2023-03-18 DIAGNOSIS — H348322 Tributary (branch) retinal vein occlusion, left eye, stable: Secondary | ICD-10-CM | POA: Diagnosis not present

## 2023-03-19 DIAGNOSIS — N2581 Secondary hyperparathyroidism of renal origin: Secondary | ICD-10-CM | POA: Diagnosis not present

## 2023-03-19 DIAGNOSIS — Z992 Dependence on renal dialysis: Secondary | ICD-10-CM | POA: Diagnosis not present

## 2023-03-19 DIAGNOSIS — N186 End stage renal disease: Secondary | ICD-10-CM | POA: Diagnosis not present

## 2023-03-20 DIAGNOSIS — N2581 Secondary hyperparathyroidism of renal origin: Secondary | ICD-10-CM | POA: Diagnosis not present

## 2023-03-20 DIAGNOSIS — Z992 Dependence on renal dialysis: Secondary | ICD-10-CM | POA: Diagnosis not present

## 2023-03-20 DIAGNOSIS — N186 End stage renal disease: Secondary | ICD-10-CM | POA: Diagnosis not present

## 2023-03-21 DIAGNOSIS — Z992 Dependence on renal dialysis: Secondary | ICD-10-CM | POA: Diagnosis not present

## 2023-03-21 DIAGNOSIS — N186 End stage renal disease: Secondary | ICD-10-CM | POA: Diagnosis not present

## 2023-03-21 DIAGNOSIS — N2581 Secondary hyperparathyroidism of renal origin: Secondary | ICD-10-CM | POA: Diagnosis not present

## 2023-03-24 ENCOUNTER — Other Ambulatory Visit: Payer: Self-pay

## 2023-03-24 ENCOUNTER — Other Ambulatory Visit (HOSPITAL_COMMUNITY): Payer: Self-pay

## 2023-03-24 DIAGNOSIS — N2581 Secondary hyperparathyroidism of renal origin: Secondary | ICD-10-CM | POA: Diagnosis not present

## 2023-03-24 DIAGNOSIS — Z992 Dependence on renal dialysis: Secondary | ICD-10-CM | POA: Diagnosis not present

## 2023-03-24 DIAGNOSIS — N186 End stage renal disease: Secondary | ICD-10-CM | POA: Diagnosis not present

## 2023-03-24 MED ORDER — ROSUVASTATIN CALCIUM 5 MG PO TABS
5.0000 mg | ORAL_TABLET | Freq: Every day | ORAL | 3 refills | Status: AC
  Filled 2023-03-24: qty 90, 90d supply, fill #0
  Filled 2023-08-29: qty 90, 90d supply, fill #1
  Filled 2024-03-02: qty 90, 90d supply, fill #2

## 2023-03-26 DIAGNOSIS — N2581 Secondary hyperparathyroidism of renal origin: Secondary | ICD-10-CM | POA: Diagnosis not present

## 2023-03-26 DIAGNOSIS — Z992 Dependence on renal dialysis: Secondary | ICD-10-CM | POA: Diagnosis not present

## 2023-03-26 DIAGNOSIS — N186 End stage renal disease: Secondary | ICD-10-CM | POA: Diagnosis not present

## 2023-03-28 ENCOUNTER — Other Ambulatory Visit (HOSPITAL_COMMUNITY): Payer: Self-pay

## 2023-03-28 DIAGNOSIS — N2581 Secondary hyperparathyroidism of renal origin: Secondary | ICD-10-CM | POA: Diagnosis not present

## 2023-03-28 DIAGNOSIS — N186 End stage renal disease: Secondary | ICD-10-CM | POA: Diagnosis not present

## 2023-03-28 DIAGNOSIS — Z992 Dependence on renal dialysis: Secondary | ICD-10-CM | POA: Diagnosis not present

## 2023-04-01 DIAGNOSIS — Z992 Dependence on renal dialysis: Secondary | ICD-10-CM | POA: Diagnosis not present

## 2023-04-01 DIAGNOSIS — N2581 Secondary hyperparathyroidism of renal origin: Secondary | ICD-10-CM | POA: Diagnosis not present

## 2023-04-01 DIAGNOSIS — N186 End stage renal disease: Secondary | ICD-10-CM | POA: Diagnosis not present

## 2023-04-02 ENCOUNTER — Other Ambulatory Visit: Payer: Self-pay

## 2023-04-02 ENCOUNTER — Other Ambulatory Visit (HOSPITAL_COMMUNITY): Payer: Self-pay

## 2023-04-02 ENCOUNTER — Encounter (HOSPITAL_COMMUNITY): Payer: Self-pay

## 2023-04-02 MED ORDER — VELPHORO 500 MG PO CHEW
1500.0000 mg | CHEWABLE_TABLET | Freq: Three times a day (TID) | ORAL | 3 refills | Status: DC
Start: 2023-04-02 — End: 2023-07-25
  Filled 2023-04-02: qty 270, 30d supply, fill #0

## 2023-04-04 DIAGNOSIS — N2581 Secondary hyperparathyroidism of renal origin: Secondary | ICD-10-CM | POA: Diagnosis not present

## 2023-04-04 DIAGNOSIS — Z992 Dependence on renal dialysis: Secondary | ICD-10-CM | POA: Diagnosis not present

## 2023-04-04 DIAGNOSIS — N186 End stage renal disease: Secondary | ICD-10-CM | POA: Diagnosis not present

## 2023-04-05 DIAGNOSIS — Z992 Dependence on renal dialysis: Secondary | ICD-10-CM | POA: Diagnosis not present

## 2023-04-05 DIAGNOSIS — I129 Hypertensive chronic kidney disease with stage 1 through stage 4 chronic kidney disease, or unspecified chronic kidney disease: Secondary | ICD-10-CM | POA: Diagnosis not present

## 2023-04-05 DIAGNOSIS — N186 End stage renal disease: Secondary | ICD-10-CM | POA: Diagnosis not present

## 2023-04-07 ENCOUNTER — Other Ambulatory Visit (HOSPITAL_COMMUNITY): Payer: Self-pay

## 2023-04-07 DIAGNOSIS — Z992 Dependence on renal dialysis: Secondary | ICD-10-CM | POA: Diagnosis not present

## 2023-04-07 DIAGNOSIS — N186 End stage renal disease: Secondary | ICD-10-CM | POA: Diagnosis not present

## 2023-04-07 DIAGNOSIS — N2581 Secondary hyperparathyroidism of renal origin: Secondary | ICD-10-CM | POA: Diagnosis not present

## 2023-04-09 DIAGNOSIS — Z992 Dependence on renal dialysis: Secondary | ICD-10-CM | POA: Diagnosis not present

## 2023-04-09 DIAGNOSIS — N186 End stage renal disease: Secondary | ICD-10-CM | POA: Diagnosis not present

## 2023-04-09 DIAGNOSIS — N2581 Secondary hyperparathyroidism of renal origin: Secondary | ICD-10-CM | POA: Diagnosis not present

## 2023-04-10 DIAGNOSIS — H43812 Vitreous degeneration, left eye: Secondary | ICD-10-CM | POA: Diagnosis not present

## 2023-04-10 DIAGNOSIS — H35032 Hypertensive retinopathy, left eye: Secondary | ICD-10-CM | POA: Diagnosis not present

## 2023-04-10 DIAGNOSIS — H35372 Puckering of macula, left eye: Secondary | ICD-10-CM | POA: Diagnosis not present

## 2023-04-10 DIAGNOSIS — H35412 Lattice degeneration of retina, left eye: Secondary | ICD-10-CM | POA: Diagnosis not present

## 2023-04-10 DIAGNOSIS — H34832 Tributary (branch) retinal vein occlusion, left eye, with macular edema: Secondary | ICD-10-CM | POA: Diagnosis not present

## 2023-04-10 DIAGNOSIS — H25042 Posterior subcapsular polar age-related cataract, left eye: Secondary | ICD-10-CM | POA: Diagnosis not present

## 2023-04-10 DIAGNOSIS — H2589 Other age-related cataract: Secondary | ICD-10-CM | POA: Diagnosis not present

## 2023-04-11 ENCOUNTER — Other Ambulatory Visit (HOSPITAL_COMMUNITY): Payer: Self-pay

## 2023-04-11 ENCOUNTER — Other Ambulatory Visit: Payer: Self-pay

## 2023-04-11 DIAGNOSIS — N186 End stage renal disease: Secondary | ICD-10-CM | POA: Diagnosis not present

## 2023-04-11 DIAGNOSIS — Z992 Dependence on renal dialysis: Secondary | ICD-10-CM | POA: Diagnosis not present

## 2023-04-11 DIAGNOSIS — N2581 Secondary hyperparathyroidism of renal origin: Secondary | ICD-10-CM | POA: Diagnosis not present

## 2023-04-12 ENCOUNTER — Other Ambulatory Visit (HOSPITAL_COMMUNITY): Payer: Self-pay

## 2023-04-14 DIAGNOSIS — N2581 Secondary hyperparathyroidism of renal origin: Secondary | ICD-10-CM | POA: Diagnosis not present

## 2023-04-14 DIAGNOSIS — N186 End stage renal disease: Secondary | ICD-10-CM | POA: Diagnosis not present

## 2023-04-14 DIAGNOSIS — Z992 Dependence on renal dialysis: Secondary | ICD-10-CM | POA: Diagnosis not present

## 2023-04-16 DIAGNOSIS — Z992 Dependence on renal dialysis: Secondary | ICD-10-CM | POA: Diagnosis not present

## 2023-04-16 DIAGNOSIS — N186 End stage renal disease: Secondary | ICD-10-CM | POA: Diagnosis not present

## 2023-04-16 DIAGNOSIS — N2581 Secondary hyperparathyroidism of renal origin: Secondary | ICD-10-CM | POA: Diagnosis not present

## 2023-04-18 DIAGNOSIS — N2581 Secondary hyperparathyroidism of renal origin: Secondary | ICD-10-CM | POA: Diagnosis not present

## 2023-04-18 DIAGNOSIS — Z992 Dependence on renal dialysis: Secondary | ICD-10-CM | POA: Diagnosis not present

## 2023-04-18 DIAGNOSIS — N186 End stage renal disease: Secondary | ICD-10-CM | POA: Diagnosis not present

## 2023-04-21 DIAGNOSIS — Z992 Dependence on renal dialysis: Secondary | ICD-10-CM | POA: Diagnosis not present

## 2023-04-21 DIAGNOSIS — N186 End stage renal disease: Secondary | ICD-10-CM | POA: Diagnosis not present

## 2023-04-21 DIAGNOSIS — N2581 Secondary hyperparathyroidism of renal origin: Secondary | ICD-10-CM | POA: Diagnosis not present

## 2023-04-23 DIAGNOSIS — N2581 Secondary hyperparathyroidism of renal origin: Secondary | ICD-10-CM | POA: Diagnosis not present

## 2023-04-23 DIAGNOSIS — N186 End stage renal disease: Secondary | ICD-10-CM | POA: Diagnosis not present

## 2023-04-23 DIAGNOSIS — Z992 Dependence on renal dialysis: Secondary | ICD-10-CM | POA: Diagnosis not present

## 2023-04-25 DIAGNOSIS — N186 End stage renal disease: Secondary | ICD-10-CM | POA: Diagnosis not present

## 2023-04-25 DIAGNOSIS — Z992 Dependence on renal dialysis: Secondary | ICD-10-CM | POA: Diagnosis not present

## 2023-04-25 DIAGNOSIS — N2581 Secondary hyperparathyroidism of renal origin: Secondary | ICD-10-CM | POA: Diagnosis not present

## 2023-04-29 DIAGNOSIS — N186 End stage renal disease: Secondary | ICD-10-CM | POA: Diagnosis not present

## 2023-04-29 DIAGNOSIS — N2581 Secondary hyperparathyroidism of renal origin: Secondary | ICD-10-CM | POA: Diagnosis not present

## 2023-04-29 DIAGNOSIS — Z992 Dependence on renal dialysis: Secondary | ICD-10-CM | POA: Diagnosis not present

## 2023-05-02 DIAGNOSIS — N186 End stage renal disease: Secondary | ICD-10-CM | POA: Diagnosis not present

## 2023-05-02 DIAGNOSIS — N2581 Secondary hyperparathyroidism of renal origin: Secondary | ICD-10-CM | POA: Diagnosis not present

## 2023-05-02 DIAGNOSIS — Z992 Dependence on renal dialysis: Secondary | ICD-10-CM | POA: Diagnosis not present

## 2023-05-06 DIAGNOSIS — Z992 Dependence on renal dialysis: Secondary | ICD-10-CM | POA: Diagnosis not present

## 2023-05-06 DIAGNOSIS — N186 End stage renal disease: Secondary | ICD-10-CM | POA: Diagnosis not present

## 2023-05-06 DIAGNOSIS — N2581 Secondary hyperparathyroidism of renal origin: Secondary | ICD-10-CM | POA: Diagnosis not present

## 2023-05-06 DIAGNOSIS — I129 Hypertensive chronic kidney disease with stage 1 through stage 4 chronic kidney disease, or unspecified chronic kidney disease: Secondary | ICD-10-CM | POA: Diagnosis not present

## 2023-05-08 ENCOUNTER — Other Ambulatory Visit (HOSPITAL_COMMUNITY): Payer: Self-pay

## 2023-05-08 ENCOUNTER — Other Ambulatory Visit: Payer: Self-pay | Admitting: Internal Medicine

## 2023-05-09 ENCOUNTER — Other Ambulatory Visit (HOSPITAL_BASED_OUTPATIENT_CLINIC_OR_DEPARTMENT_OTHER): Payer: Self-pay

## 2023-05-09 ENCOUNTER — Other Ambulatory Visit (HOSPITAL_COMMUNITY): Payer: Self-pay

## 2023-05-09 DIAGNOSIS — Z992 Dependence on renal dialysis: Secondary | ICD-10-CM | POA: Diagnosis not present

## 2023-05-09 DIAGNOSIS — N186 End stage renal disease: Secondary | ICD-10-CM | POA: Diagnosis not present

## 2023-05-09 DIAGNOSIS — N2581 Secondary hyperparathyroidism of renal origin: Secondary | ICD-10-CM | POA: Diagnosis not present

## 2023-05-09 MED ORDER — ISOSORBIDE MONONITRATE ER 60 MG PO TB24
30.0000 mg | ORAL_TABLET | Freq: Every day | ORAL | 0 refills | Status: DC
  Filled 2023-05-09: qty 15, 30d supply, fill #0

## 2023-05-12 DIAGNOSIS — Z992 Dependence on renal dialysis: Secondary | ICD-10-CM | POA: Diagnosis not present

## 2023-05-12 DIAGNOSIS — N2581 Secondary hyperparathyroidism of renal origin: Secondary | ICD-10-CM | POA: Diagnosis not present

## 2023-05-12 DIAGNOSIS — N186 End stage renal disease: Secondary | ICD-10-CM | POA: Diagnosis not present

## 2023-05-14 DIAGNOSIS — N2581 Secondary hyperparathyroidism of renal origin: Secondary | ICD-10-CM | POA: Diagnosis not present

## 2023-05-14 DIAGNOSIS — N186 End stage renal disease: Secondary | ICD-10-CM | POA: Diagnosis not present

## 2023-05-14 DIAGNOSIS — Z992 Dependence on renal dialysis: Secondary | ICD-10-CM | POA: Diagnosis not present

## 2023-05-16 ENCOUNTER — Other Ambulatory Visit (HOSPITAL_COMMUNITY): Payer: Self-pay

## 2023-05-16 DIAGNOSIS — Z992 Dependence on renal dialysis: Secondary | ICD-10-CM | POA: Diagnosis not present

## 2023-05-16 DIAGNOSIS — N186 End stage renal disease: Secondary | ICD-10-CM | POA: Diagnosis not present

## 2023-05-16 DIAGNOSIS — N2581 Secondary hyperparathyroidism of renal origin: Secondary | ICD-10-CM | POA: Diagnosis not present

## 2023-05-16 MED ORDER — ISOSORBIDE MONONITRATE ER 30 MG PO TB24
30.0000 mg | ORAL_TABLET | Freq: Every day | ORAL | 3 refills | Status: DC
  Filled 2023-05-16: qty 90, 90d supply, fill #0

## 2023-05-19 DIAGNOSIS — Z992 Dependence on renal dialysis: Secondary | ICD-10-CM | POA: Diagnosis not present

## 2023-05-19 DIAGNOSIS — N186 End stage renal disease: Secondary | ICD-10-CM | POA: Diagnosis not present

## 2023-05-19 DIAGNOSIS — N2581 Secondary hyperparathyroidism of renal origin: Secondary | ICD-10-CM | POA: Diagnosis not present

## 2023-05-20 DIAGNOSIS — N2581 Secondary hyperparathyroidism of renal origin: Secondary | ICD-10-CM | POA: Diagnosis not present

## 2023-05-20 DIAGNOSIS — N186 End stage renal disease: Secondary | ICD-10-CM | POA: Diagnosis not present

## 2023-05-20 DIAGNOSIS — Z992 Dependence on renal dialysis: Secondary | ICD-10-CM | POA: Diagnosis not present

## 2023-05-21 DIAGNOSIS — N2581 Secondary hyperparathyroidism of renal origin: Secondary | ICD-10-CM | POA: Diagnosis not present

## 2023-05-21 DIAGNOSIS — Z992 Dependence on renal dialysis: Secondary | ICD-10-CM | POA: Diagnosis not present

## 2023-05-21 DIAGNOSIS — N186 End stage renal disease: Secondary | ICD-10-CM | POA: Diagnosis not present

## 2023-05-22 ENCOUNTER — Other Ambulatory Visit (HOSPITAL_COMMUNITY): Payer: Self-pay

## 2023-05-23 DIAGNOSIS — N2581 Secondary hyperparathyroidism of renal origin: Secondary | ICD-10-CM | POA: Diagnosis not present

## 2023-05-23 DIAGNOSIS — N186 End stage renal disease: Secondary | ICD-10-CM | POA: Diagnosis not present

## 2023-05-23 DIAGNOSIS — Z992 Dependence on renal dialysis: Secondary | ICD-10-CM | POA: Diagnosis not present

## 2023-05-26 DIAGNOSIS — Z992 Dependence on renal dialysis: Secondary | ICD-10-CM | POA: Diagnosis not present

## 2023-05-26 DIAGNOSIS — N186 End stage renal disease: Secondary | ICD-10-CM | POA: Diagnosis not present

## 2023-05-26 DIAGNOSIS — N2581 Secondary hyperparathyroidism of renal origin: Secondary | ICD-10-CM | POA: Diagnosis not present

## 2023-05-28 DIAGNOSIS — N2581 Secondary hyperparathyroidism of renal origin: Secondary | ICD-10-CM | POA: Diagnosis not present

## 2023-05-28 DIAGNOSIS — Z992 Dependence on renal dialysis: Secondary | ICD-10-CM | POA: Diagnosis not present

## 2023-05-28 DIAGNOSIS — N186 End stage renal disease: Secondary | ICD-10-CM | POA: Diagnosis not present

## 2023-05-30 DIAGNOSIS — Z992 Dependence on renal dialysis: Secondary | ICD-10-CM | POA: Diagnosis not present

## 2023-05-30 DIAGNOSIS — N2581 Secondary hyperparathyroidism of renal origin: Secondary | ICD-10-CM | POA: Diagnosis not present

## 2023-05-30 DIAGNOSIS — N186 End stage renal disease: Secondary | ICD-10-CM | POA: Diagnosis not present

## 2023-06-02 DIAGNOSIS — N2581 Secondary hyperparathyroidism of renal origin: Secondary | ICD-10-CM | POA: Diagnosis not present

## 2023-06-02 DIAGNOSIS — N186 End stage renal disease: Secondary | ICD-10-CM | POA: Diagnosis not present

## 2023-06-02 DIAGNOSIS — Z992 Dependence on renal dialysis: Secondary | ICD-10-CM | POA: Diagnosis not present

## 2023-06-03 ENCOUNTER — Ambulatory Visit (INDEPENDENT_AMBULATORY_CARE_PROVIDER_SITE_OTHER): Payer: Medicare HMO | Admitting: Podiatry

## 2023-06-03 DIAGNOSIS — Z91199 Patient's noncompliance with other medical treatment and regimen due to unspecified reason: Secondary | ICD-10-CM

## 2023-06-04 DIAGNOSIS — Z992 Dependence on renal dialysis: Secondary | ICD-10-CM | POA: Diagnosis not present

## 2023-06-04 DIAGNOSIS — N2581 Secondary hyperparathyroidism of renal origin: Secondary | ICD-10-CM | POA: Diagnosis not present

## 2023-06-04 DIAGNOSIS — N186 End stage renal disease: Secondary | ICD-10-CM | POA: Diagnosis not present

## 2023-06-04 NOTE — Progress Notes (Signed)
Patient was no-show for appointment today

## 2023-06-06 DIAGNOSIS — I129 Hypertensive chronic kidney disease with stage 1 through stage 4 chronic kidney disease, or unspecified chronic kidney disease: Secondary | ICD-10-CM | POA: Diagnosis not present

## 2023-06-06 DIAGNOSIS — N2581 Secondary hyperparathyroidism of renal origin: Secondary | ICD-10-CM | POA: Diagnosis not present

## 2023-06-06 DIAGNOSIS — N186 End stage renal disease: Secondary | ICD-10-CM | POA: Diagnosis not present

## 2023-06-06 DIAGNOSIS — Z992 Dependence on renal dialysis: Secondary | ICD-10-CM | POA: Diagnosis not present

## 2023-06-09 DIAGNOSIS — N186 End stage renal disease: Secondary | ICD-10-CM | POA: Diagnosis not present

## 2023-06-09 DIAGNOSIS — N2581 Secondary hyperparathyroidism of renal origin: Secondary | ICD-10-CM | POA: Diagnosis not present

## 2023-06-09 DIAGNOSIS — Z992 Dependence on renal dialysis: Secondary | ICD-10-CM | POA: Diagnosis not present

## 2023-06-11 DIAGNOSIS — N2581 Secondary hyperparathyroidism of renal origin: Secondary | ICD-10-CM | POA: Diagnosis not present

## 2023-06-11 DIAGNOSIS — Z992 Dependence on renal dialysis: Secondary | ICD-10-CM | POA: Diagnosis not present

## 2023-06-11 DIAGNOSIS — N186 End stage renal disease: Secondary | ICD-10-CM | POA: Diagnosis not present

## 2023-06-13 ENCOUNTER — Other Ambulatory Visit: Payer: Self-pay

## 2023-06-13 ENCOUNTER — Emergency Department (HOSPITAL_COMMUNITY): Payer: Medicare HMO

## 2023-06-13 ENCOUNTER — Encounter (HOSPITAL_COMMUNITY): Payer: Self-pay

## 2023-06-13 ENCOUNTER — Emergency Department (HOSPITAL_COMMUNITY)
Admission: EM | Admit: 2023-06-13 | Discharge: 2023-06-13 | Disposition: A | Discharge status: Home / Self Care | Payer: Medicare HMO | Attending: Emergency Medicine | Admitting: Emergency Medicine

## 2023-06-13 DIAGNOSIS — R55 Syncope and collapse: Secondary | ICD-10-CM | POA: Diagnosis not present

## 2023-06-13 DIAGNOSIS — R61 Generalized hyperhidrosis: Secondary | ICD-10-CM | POA: Diagnosis present

## 2023-06-13 DIAGNOSIS — Z992 Dependence on renal dialysis: Secondary | ICD-10-CM | POA: Diagnosis not present

## 2023-06-13 DIAGNOSIS — N186 End stage renal disease: Secondary | ICD-10-CM | POA: Diagnosis not present

## 2023-06-13 DIAGNOSIS — I132 Hypertensive heart and chronic kidney disease with heart failure and with stage 5 chronic kidney disease, or end stage renal disease: Secondary | ICD-10-CM | POA: Diagnosis not present

## 2023-06-13 DIAGNOSIS — N2581 Secondary hyperparathyroidism of renal origin: Secondary | ICD-10-CM | POA: Diagnosis not present

## 2023-06-13 DIAGNOSIS — I509 Heart failure, unspecified: Secondary | ICD-10-CM | POA: Diagnosis not present

## 2023-06-13 DIAGNOSIS — I10 Essential (primary) hypertension: Secondary | ICD-10-CM | POA: Diagnosis not present

## 2023-06-13 DIAGNOSIS — E860 Dehydration: Secondary | ICD-10-CM | POA: Insufficient documentation

## 2023-06-13 DIAGNOSIS — I959 Hypotension, unspecified: Secondary | ICD-10-CM | POA: Diagnosis not present

## 2023-06-13 LAB — CBC WITH DIFFERENTIAL/PLATELET
Abs Immature Granulocytes: 0.09 10*3/uL — ABNORMAL HIGH (ref 0.00–0.07)
Basophils Absolute: 0.1 10*3/uL (ref 0.0–0.1)
Basophils Relative: 1 %
Eosinophils Absolute: 0.1 10*3/uL (ref 0.0–0.5)
Eosinophils Relative: 1 %
HCT: 36.9 % — ABNORMAL LOW (ref 39.0–52.0)
Hemoglobin: 12.7 g/dL — ABNORMAL LOW (ref 13.0–17.0)
Immature Granulocytes: 1 %
Lymphocytes Relative: 13 %
Lymphs Abs: 1.1 10*3/uL (ref 0.7–4.0)
MCH: 33.3 pg (ref 26.0–34.0)
MCHC: 34.4 g/dL (ref 30.0–36.0)
MCV: 96.9 fL (ref 80.0–100.0)
Monocytes Absolute: 0.9 10*3/uL (ref 0.1–1.0)
Monocytes Relative: 10 %
Neutro Abs: 6.6 10*3/uL (ref 1.7–7.7)
Neutrophils Relative %: 74 %
Platelets: 263 10*3/uL (ref 150–400)
RBC: 3.81 MIL/uL — ABNORMAL LOW (ref 4.22–5.81)
RDW: 15.5 % (ref 11.5–15.5)
WBC: 8.9 10*3/uL (ref 4.0–10.5)
nRBC: 0 % (ref 0.0–0.2)

## 2023-06-13 LAB — COMPREHENSIVE METABOLIC PANEL
ALT: 16 U/L (ref 0–44)
AST: 23 U/L (ref 15–41)
Albumin: 4 g/dL (ref 3.5–5.0)
Alkaline Phosphatase: 62 U/L (ref 38–126)
Anion gap: 12 (ref 5–15)
BUN: 13 mg/dL (ref 6–20)
CO2: 28 mmol/L (ref 22–32)
Calcium: 10 mg/dL (ref 8.9–10.3)
Chloride: 96 mmol/L — ABNORMAL LOW (ref 98–111)
Creatinine, Ser: 4.8 mg/dL — ABNORMAL HIGH (ref 0.61–1.24)
GFR, Estimated: 13 mL/min — ABNORMAL LOW (ref >60)
Glucose, Bld: 88 mg/dL (ref 70–99)
Potassium: 3.6 mmol/L (ref 3.5–5.1)
Sodium: 136 mmol/L (ref 135–145)
Total Bilirubin: 2 mg/dL — ABNORMAL HIGH (ref 0.0–1.2)
Total Protein: 8.2 g/dL — ABNORMAL HIGH (ref 6.5–8.1)

## 2023-06-13 LAB — TROPONIN I (HIGH SENSITIVITY)
Troponin I (High Sensitivity): 92 ng/L — ABNORMAL HIGH (ref <18)
Troponin I (High Sensitivity): 95 ng/L — ABNORMAL HIGH (ref <18)

## 2023-06-13 NOTE — ED Notes (Signed)
 Pt refused covid swab at this time

## 2023-06-13 NOTE — ED Triage Notes (Signed)
 Pt was at [employer]. Cool, pale, diaphoretic, EMS states BP 70's. Pt had dialysis today and is compliant. EMS gave 500cc of NS. Pt arrives axox4.

## 2023-06-13 NOTE — Discharge Instructions (Addendum)
 You are seen in the emergency department today for concerns of a possible hypotensive episode.  You were noted to have low blood pressure and you were given fluids by EMS.  Your labs today were thankfully reassuring and does not appear that there is any abnormal findings related to your kidney function at this time with your recent dialysis today.  I suspect your low blood pressure is due to some level of dehydration with your recent dialysis.  I would still try to strictly adhere to your fluid restriction amounts to avoid any new or worsening symptoms.  Please, following up with your primary care provider.  Continue with your dialysis sessions as currently planned.  Return to the emergency department for any new or worsening symptoms.

## 2023-06-15 NOTE — ED Provider Notes (Signed)
 [place] EMERGENCY DEPARTMENT AT [facility] Provider Note   CSN: [id number] Arrival date & time: 06/13/23  1714     History Chief Complaint  Patient presents with   Hypotension    Paul [name] is a 58 y.o. male. Patient with past history of ESRD on dialysis, CHF, HTN, schizophrenia and bipolar disorder presents to the ED for concerns of hypotension. Patient reportedly was at a grocery store when he became cool, pale, and diaphoretic, but did not experience any chest pain or shortness of breath. EMS initially noted systolic pressure in the 70s when they arrived. Patient subsequently given a 500ml fluid bolus to address hypotension with stabilization of pressure. Patient did have dialysis session today but denies anything out of the norm. Denies any recent fever, chills, headache, bodyaches, sore throat, nausea, vomiting, or diarrhea. No sick contacts. States he has been eating baseline diet and adhering to fluid restriction recommendations.  HPI     Home Medications Prior to Admission medications   Medication Sig Start Date End Date Taking? Authorizing Provider  carvedilol  (COREG ) 6.25 MG tablet Take 1 tablet (6.25 mg total) by mouth 2 (two) times daily with a meal. 06/15/22 06/22/23  Jerrye Katheryn BROCKS, MD  isosorbide  mononitrate (IMDUR ) 30 MG 24 hr tablet Take 1 tablet (30 mg total) by mouth daily. 05/16/23     isosorbide  mononitrate (IMDUR ) 60 MG 24 hr tablet Take 0.5 tablets (30 mg total) by mouth daily. 05/09/23   [name], MD  lidocaine -prilocaine  (EMLA ) cream Apply 1 a small amount to skin three times a week apply to AVF 30-60 min prior to dialysis, wrap site w plastic wrap 09/12/22     polyethylene glycol powder (GLYCOLAX /MIRALAX ) 17 GM/SCOOP powder Take 17 g by mouth daily. 01/29/23   Regalado, Owen A, MD  rosuvastatin  (CRESTOR ) 5 MG tablet Take 1 tablet by mouth once a day 11/21/22     rosuvastatin  (CRESTOR ) 5 MG tablet Take 1 tablet (5 mg total) by mouth daily.  03/24/23     sucroferric oxyhydroxide (VELPHORO ) 500 MG chewable tablet Chew 2 tablets (1,000 mg total) by mouth with breakfast, with lunch, and with evening meal. 08/08/22     sucroferric oxyhydroxide (VELPHORO ) 500 MG chewable tablet Chew 3 tablets (1,500 mg total) by mouth 3 (three) times daily with meals. 04/02/23         Allergies    Patient has no active allergies.    Review of Systems   Review of Systems  Constitutional:  Positive for diaphoresis.  All other systems reviewed and are negative.   Physical Exam Updated Vital Signs BP 109/86   Pulse (!) 113   Temp (!) 97 F (36.1 C) (Temporal)   Resp 14   Ht 5' 3 (1.6 m)   Wt 75 kg   SpO2 98%   BMI 29.29 kg/m  Physical Exam Vitals and nursing note reviewed.  Constitutional:      General: He is not in acute distress.    Appearance: He is well-developed.  HENT:     Head: Normocephalic and atraumatic.  Eyes:     Conjunctiva/sclera: Conjunctivae normal.  Cardiovascular:     Rate and Rhythm: Normal rate and regular rhythm.     Heart sounds: No murmur heard. Pulmonary:     Effort: Pulmonary effort is normal. No respiratory distress.     Breath sounds: Normal breath sounds.  Abdominal:     Palpations: Abdomen is soft.     Tenderness: There is  no abdominal tenderness.  Musculoskeletal:        General: No swelling.     Cervical back: Neck supple.  Skin:    General: Skin is warm and dry.     Capillary Refill: Capillary refill takes less than 2 seconds.  Neurological:     Mental Status: He is alert.  Psychiatric:        Mood and Affect: Mood normal.     ED Results / Procedures / Treatments   Labs (all labs ordered are listed, but only abnormal results are displayed) Labs Reviewed  CBC WITH DIFFERENTIAL/PLATELET - Abnormal; Notable for the following components:      Result Value   RBC 3.81 (*)    Hemoglobin 12.7 (*)    HCT 36.9 (*)    Abs Immature Granulocytes 0.09 (*)    All other components within normal  limits  COMPREHENSIVE METABOLIC PANEL - Abnormal; Notable for the following components:   Chloride 96 (*)    Creatinine, Ser 4.80 (*)    Total Protein 8.2 (*)    Total Bilirubin 2.0 (*)    GFR, Estimated 13 (*)    All other components within normal limits  TROPONIN I (HIGH SENSITIVITY) - Abnormal; Notable for the following components:   Troponin I (High Sensitivity) 92 (*)    All other components within normal limits  TROPONIN I (HIGH SENSITIVITY) - Abnormal; Notable for the following components:   Troponin I (High Sensitivity) 95 (*)    All other components within normal limits  RESP PANEL BY RT-PCR (RSV, FLU A&B, COVID)  RVPGX2    EKG None  Radiology No results found.  Procedures Procedures    Medications Ordered in ED Medications - No data to display  ED Course/ Medical Decision Making/ A&P                                 Medical Decision Making Amount and/or Complexity of Data Reviewed Labs: ordered. Radiology: ordered.   This patient presents to the ED for concern of hypotension. Differential diagnosis includes dehydration, septic shock, PE, ACS, pneumonia   Lab Tests:  I Ordered, and personally interpreted labs.  The pertinent results include:  CBC unremarkable and at baseline, CMP reveal renal dysfunction but better than baseline due to receiving dialysis earlier today, troponin elevated at 92 and 95 but better than prior levels   Imaging Studies ordered:  I ordered imaging studies including chest xray  I independently visualized and interpreted imaging which showed no acute cardiopulmonary process seen. I agree with the radiologist interpretation   Problem List / ED Course:  VPatient with past history of ESRD on dialysis, CHF, HTN, schizophrenia and bipolar disorder presents to the ED for concerns of hypotension. He was at a local grocery store when he began to feel unwell and lowered himself to the ground. EMS was called in and found patient  diaphoretic with systolic BP in the 70s. Patient had received dialysis today prior to these events. No recent fever, chills, cough, bodyaches, vomiting, or diarrhea. Currently alter and oriented X4 and at baseline. Exam is unremarkable. Patient acting appropriately and responding to all questions. Patient had received 500ml of fluids by EMS due to concerns of hypotension with improvement and stabilization of pressure prior to arriving. Will obtain basic labs for evaluation of hydration status vs possible infectious source of symptoms. Given BP is stable, no acute indication for continued fluids.  Lab workup largely unremarkable. Patient's troponin is elevated but not currently endorsing any chest pain. I suspect this is due to patient's ESRD and inability to clear troponin well. Labs otherwise do not reveal significant dehydration. Patient refused respiratory swab but doubt acute respiratory infection given lack of cough, congestion, and fevers. As patient has remained stable here in the ED with no episodes of hypotension during a 4 hour period of observation, I feel that he is stable for discharge home and outpatient follow up. I suspect the hypotension patient experienced earlier was due to dehydration following his HD session. Encouraged patient to ensure he is remaining properly hydrated and adhering to any fluid restriction recommendations he has previously received.   Social Determinants of Health:  Legally blind   Final Clinical Impression(s) / ED Diagnoses Final diagnoses:  Dehydration    Rx / DC Orders ED Discharge Orders     None         Cecily Legrand LABOR, PA-C 06/15/23 2302    Tegeler, Lonni PARAS, MD 06/15/23 2314

## 2023-06-16 DIAGNOSIS — N186 End stage renal disease: Secondary | ICD-10-CM | POA: Diagnosis not present

## 2023-06-16 DIAGNOSIS — Z992 Dependence on renal dialysis: Secondary | ICD-10-CM | POA: Diagnosis not present

## 2023-06-16 DIAGNOSIS — N2581 Secondary hyperparathyroidism of renal origin: Secondary | ICD-10-CM | POA: Diagnosis not present

## 2023-06-17 ENCOUNTER — Ambulatory Visit: Payer: Medicare HMO | Admitting: Podiatry

## 2023-06-18 DIAGNOSIS — Z992 Dependence on renal dialysis: Secondary | ICD-10-CM | POA: Diagnosis not present

## 2023-06-18 DIAGNOSIS — N186 End stage renal disease: Secondary | ICD-10-CM | POA: Diagnosis not present

## 2023-06-18 DIAGNOSIS — N2581 Secondary hyperparathyroidism of renal origin: Secondary | ICD-10-CM | POA: Diagnosis not present

## 2023-06-20 DIAGNOSIS — N186 End stage renal disease: Secondary | ICD-10-CM | POA: Diagnosis not present

## 2023-06-20 DIAGNOSIS — N2581 Secondary hyperparathyroidism of renal origin: Secondary | ICD-10-CM | POA: Diagnosis not present

## 2023-06-20 DIAGNOSIS — Z992 Dependence on renal dialysis: Secondary | ICD-10-CM | POA: Diagnosis not present

## 2023-06-23 DIAGNOSIS — N186 End stage renal disease: Secondary | ICD-10-CM | POA: Diagnosis not present

## 2023-06-23 DIAGNOSIS — Z992 Dependence on renal dialysis: Secondary | ICD-10-CM | POA: Diagnosis not present

## 2023-06-23 DIAGNOSIS — N2581 Secondary hyperparathyroidism of renal origin: Secondary | ICD-10-CM | POA: Diagnosis not present

## 2023-06-25 DIAGNOSIS — N2581 Secondary hyperparathyroidism of renal origin: Secondary | ICD-10-CM | POA: Diagnosis not present

## 2023-06-25 DIAGNOSIS — N186 End stage renal disease: Secondary | ICD-10-CM | POA: Diagnosis not present

## 2023-06-25 DIAGNOSIS — Z992 Dependence on renal dialysis: Secondary | ICD-10-CM | POA: Diagnosis not present

## 2023-06-27 ENCOUNTER — Telehealth: Payer: Self-pay

## 2023-06-27 ENCOUNTER — Telehealth: Payer: Self-pay | Admitting: *Deleted

## 2023-06-27 DIAGNOSIS — Z992 Dependence on renal dialysis: Secondary | ICD-10-CM | POA: Diagnosis not present

## 2023-06-27 DIAGNOSIS — N2581 Secondary hyperparathyroidism of renal origin: Secondary | ICD-10-CM | POA: Diagnosis not present

## 2023-06-27 DIAGNOSIS — N186 End stage renal disease: Secondary | ICD-10-CM

## 2023-06-27 NOTE — Progress Notes (Signed)
Complex Care Management Note  Care Guide Note 06/27/2023 Name: Paul [name] MRN: [id number] DOB: [birth year]  [name] is a 58 y.o. year old male who sees Marcine Matar, MD for primary care. I reached out to Leticia Clas by phone today to offer complex care management services.  [name] was given information about Complex Care Management services today including:   The Complex Care Management services include support from the care team which includes your Nurse Care Manager, Clinical Social Worker, or Pharmacist.  The Complex Care Management team is here to help remove barriers to the health concerns and goals most important to you. Complex Care Management services are voluntary, and the patient may decline or stop services at any time by request to their care team member.   Complex Care Management Consent Status: Patient agreed to services and verbal consent obtained.   Follow up plan:  Telephone appointment with complex care management team member scheduled for:  2/25  Encounter Outcome:  Patient Scheduled  Gwenevere Ghazi  [facility]  [facility], [facility] Guide  Direct Dial: [phone]  Fax [phone]

## 2023-06-30 DIAGNOSIS — N2581 Secondary hyperparathyroidism of renal origin: Secondary | ICD-10-CM | POA: Diagnosis not present

## 2023-06-30 DIAGNOSIS — Z992 Dependence on renal dialysis: Secondary | ICD-10-CM | POA: Diagnosis not present

## 2023-06-30 DIAGNOSIS — N186 End stage renal disease: Secondary | ICD-10-CM | POA: Diagnosis not present

## 2023-07-01 ENCOUNTER — Ambulatory Visit: Payer: Self-pay | Admitting: *Deleted

## 2023-07-01 ENCOUNTER — Encounter: Payer: Self-pay | Admitting: *Deleted

## 2023-07-01 DIAGNOSIS — N186 End stage renal disease: Secondary | ICD-10-CM

## 2023-07-01 NOTE — Patient Outreach (Signed)
 Care Coordination   Initial Visit Note (Partial)   07/01/2023 Name: Paul [name] MRN: [id number] DOB: 03/04/1966  Paul [name] is a 58 y.o. year old male who sees Marcine Matar, MD for primary care. I spoke with  Paul [name] by phone today.  What matters to the patients health and wellness today?  Patient would like assistance with housing resources    Goals Addressed             This Visit's Progress    Care Management Needs       Care Management Goals: Patient will talk with BSW regarding housing needs and assistance Patient will reach out to RN Care Manager as needed          SDOH assessments and interventions completed:  Yes  SDOH Interventions Today    Flowsheet Row Most Recent Value  SDOH Interventions   Food Insecurity Interventions Intervention Not Indicated  Housing Interventions AMB Referral  [lives with others in a group setting. Would like assistance with finding his own home]  Utilities Interventions Intervention Not Indicated  Alcohol Usage Interventions Intervention Not Indicated (Score <7)  Social Connections Interventions Intervention Not Indicated  Health Literacy Interventions Intervention Not Indicated        Care Coordination Interventions:  Yes, provided  Interventions Today    Flowsheet Row Most Recent Value  Chronic Disease   Chronic disease during today's visit Chronic Kidney Disease/End Stage Renal Disease (ESRD)  General Interventions   General Interventions Discussed/Reviewed General Interventions Reviewed, General Interventions Discussed, [employer], Doctor Visits  [Patient has food stamps. He uses transportation arranged through [facility] for office visits.]  Doctor Visits Discussed/Reviewed Doctor Visits Discussed, Doctor Visits Reviewed, PCP, Specialist  PCP/Specialist Visits Compliance with follow-up visit  [Dialysis on M-W-F. Utilizes [employer]. New Pt. Appt. at [facility] on 07/08/23. Provided with address and telephone number. He will arrange transportation.]  Exercise Interventions   Exercise Discussed/Reviewed Exercise Discussed, Exercise Reviewed, Physical Activity  [ambulates without assistance and is able to perform ADLs independently. He does depend on others for transportation. Stated understanding that dialysis leaves you fatigued. Encouraged to increase activity level as tolerated.]  Physical Activity Discussed/Reviewed Physical Activity Discussed, Physical Activity Reviewed  Education Interventions   Education Provided Provided Education  Provided Verbal Education On [employer], Exercise, When to see the doctor  Mental Health Interventions   Mental Health Discussed/Reviewed Refer to Social Work for resources  Refer to Social Work for resources regarding [employer] with housing]       Follow up plan: Follow up call scheduled for 07/03/23 to complete initial evaluation. Patient was unable to remain on the phone any longer but agreed to another call.    Encounter Outcome:  Patient Visit Completed   Demetrios Loll, RN, BSN [place]  [facility], [facility] RN Care Manager Direct Dial: [phone]

## 2023-07-02 DIAGNOSIS — N186 End stage renal disease: Secondary | ICD-10-CM | POA: Diagnosis not present

## 2023-07-02 DIAGNOSIS — Z992 Dependence on renal dialysis: Secondary | ICD-10-CM | POA: Diagnosis not present

## 2023-07-02 DIAGNOSIS — N2581 Secondary hyperparathyroidism of renal origin: Secondary | ICD-10-CM | POA: Diagnosis not present

## 2023-07-03 ENCOUNTER — Telehealth: Payer: Self-pay | Admitting: *Deleted

## 2023-07-03 ENCOUNTER — Ambulatory Visit: Payer: Self-pay | Admitting: *Deleted

## 2023-07-03 NOTE — Patient Outreach (Signed)
 Care Coordination   07/03/2023 Name: Paul [name] MRN: [id number] DOB: [birth year]   Care Coordination Outreach Attempts:  An unsuccessful outreach was attempted for an appointment today. Attempt to complete initial visit.   Follow Up Plan:  Additional outreach attempts will be made to offer the patient complex care management information and services.   Encounter Outcome:  No Answer   Care Coordination Interventions:  No, not indicated    Demetrios Loll, RN, BSN [place]  [facility], [facility] RN Care Manager Direct Dial: [phone]

## 2023-07-03 NOTE — Progress Notes (Signed)
 Complex Care Management Care Guide Note  07/03/2023 Name: Paul [name] MRN: [id number] DOB: 05-Oct-1965  Paul [name] is a 58 y.o. year old male who is a primary care patient of Marcine Matar, MD and is actively engaged with the care management team. I reached out to Paul [name] by phone today to assist with scheduling  with the BSW.  Follow up plan: Unsuccessful telephone outreach attempt made. A HIPAA compliant phone message was left for the patient providing contact information and requesting a return call.  Gwenevere Ghazi  [facility]  [facility], [facility] Guide  Direct Dial: [phone]  Fax [phone]

## 2023-07-04 DIAGNOSIS — Z992 Dependence on renal dialysis: Secondary | ICD-10-CM | POA: Diagnosis not present

## 2023-07-04 DIAGNOSIS — N186 End stage renal disease: Secondary | ICD-10-CM | POA: Diagnosis not present

## 2023-07-04 DIAGNOSIS — N2581 Secondary hyperparathyroidism of renal origin: Secondary | ICD-10-CM | POA: Diagnosis not present

## 2023-07-04 DIAGNOSIS — I129 Hypertensive chronic kidney disease with stage 1 through stage 4 chronic kidney disease, or unspecified chronic kidney disease: Secondary | ICD-10-CM | POA: Diagnosis not present

## 2023-07-07 NOTE — Progress Notes (Unsigned)
 Complex Care Management Care Guide Note  07/07/2023 Name: Paul [name] MRN: [id number] DOB: [birth year]  Paul [name] is a 58 y.o. year old male who is a primary care patient of Marcine Matar, MD and is actively engaged with the care management team. I reached out to Paul Howard Morten by phone today to assist with scheduling  with the BSW.  Follow up plan: Unsuccessful telephone outreach attempt made. A HIPAA compliant phone message was left for the patient providing contact information and requesting a return call.  Gwenevere Ghazi  [facility]  Value-Based Care Institute, [facility] Guide  Direct Dial: [phone]  Fax [phone]

## 2023-07-08 ENCOUNTER — Ambulatory Visit (INDEPENDENT_AMBULATORY_CARE_PROVIDER_SITE_OTHER): Payer: Medicare HMO | Admitting: Podiatry

## 2023-07-08 ENCOUNTER — Encounter (HOSPITAL_COMMUNITY): Payer: Self-pay | Admitting: *Deleted

## 2023-07-08 ENCOUNTER — Other Ambulatory Visit: Payer: Self-pay

## 2023-07-08 ENCOUNTER — Encounter: Payer: Self-pay | Admitting: Podiatry

## 2023-07-08 ENCOUNTER — Emergency Department (HOSPITAL_COMMUNITY): Admission: EM | Admit: 2023-07-08 | Discharge: 2023-07-14 | Disposition: A | Discharge status: Home / Self Care

## 2023-07-08 ENCOUNTER — Other Ambulatory Visit (HOSPITAL_COMMUNITY): Payer: Self-pay

## 2023-07-08 DIAGNOSIS — I509 Heart failure, unspecified: Secondary | ICD-10-CM | POA: Diagnosis not present

## 2023-07-08 DIAGNOSIS — Z79899 Other long term (current) drug therapy: Secondary | ICD-10-CM | POA: Insufficient documentation

## 2023-07-08 DIAGNOSIS — B353 Tinea pedis: Secondary | ICD-10-CM

## 2023-07-08 DIAGNOSIS — D631 Anemia in chronic kidney disease: Secondary | ICD-10-CM | POA: Insufficient documentation

## 2023-07-08 DIAGNOSIS — M79674 Pain in right toe(s): Secondary | ICD-10-CM | POA: Diagnosis not present

## 2023-07-08 DIAGNOSIS — R7309 Other abnormal glucose: Secondary | ICD-10-CM | POA: Insufficient documentation

## 2023-07-08 DIAGNOSIS — M79675 Pain in left toe(s): Secondary | ICD-10-CM

## 2023-07-08 DIAGNOSIS — R45850 Homicidal ideations: Secondary | ICD-10-CM | POA: Diagnosis not present

## 2023-07-08 DIAGNOSIS — F250 Schizoaffective disorder, bipolar type: Secondary | ICD-10-CM | POA: Diagnosis not present

## 2023-07-08 DIAGNOSIS — N186 End stage renal disease: Secondary | ICD-10-CM | POA: Diagnosis not present

## 2023-07-08 DIAGNOSIS — I132 Hypertensive heart and chronic kidney disease with heart failure and with stage 5 chronic kidney disease, or end stage renal disease: Secondary | ICD-10-CM | POA: Diagnosis not present

## 2023-07-08 DIAGNOSIS — F22 Delusional disorders: Secondary | ICD-10-CM | POA: Diagnosis not present

## 2023-07-08 DIAGNOSIS — N2581 Secondary hyperparathyroidism of renal origin: Secondary | ICD-10-CM | POA: Diagnosis not present

## 2023-07-08 DIAGNOSIS — I10 Essential (primary) hypertension: Secondary | ICD-10-CM | POA: Diagnosis not present

## 2023-07-08 DIAGNOSIS — Z992 Dependence on renal dialysis: Secondary | ICD-10-CM | POA: Insufficient documentation

## 2023-07-08 DIAGNOSIS — Z96643 Presence of artificial hip joint, bilateral: Secondary | ICD-10-CM | POA: Diagnosis not present

## 2023-07-08 DIAGNOSIS — B351 Tinea unguium: Secondary | ICD-10-CM

## 2023-07-08 DIAGNOSIS — I120 Hypertensive chronic kidney disease with stage 5 chronic kidney disease or end stage renal disease: Secondary | ICD-10-CM | POA: Diagnosis not present

## 2023-07-08 DIAGNOSIS — E8779 Other fluid overload: Secondary | ICD-10-CM | POA: Diagnosis not present

## 2023-07-08 LAB — COMPREHENSIVE METABOLIC PANEL
ALT: 18 U/L (ref 0–44)
AST: 14 U/L — ABNORMAL LOW (ref 15–41)
Albumin: 3.6 g/dL (ref 3.5–5.0)
Alkaline Phosphatase: 87 U/L (ref 38–126)
Anion gap: 22 — ABNORMAL HIGH (ref 5–15)
BUN: 107 mg/dL — ABNORMAL HIGH (ref 6–20)
CO2: 20 mmol/L — ABNORMAL LOW (ref 22–32)
Calcium: 8.4 mg/dL — ABNORMAL LOW (ref 8.9–10.3)
Chloride: 97 mmol/L — ABNORMAL LOW (ref 98–111)
Creatinine, Ser: 14.91 mg/dL — ABNORMAL HIGH (ref 0.61–1.24)
GFR, Estimated: 3 mL/min — ABNORMAL LOW (ref >60)
Glucose, Bld: 118 mg/dL — ABNORMAL HIGH (ref 70–99)
Potassium: 4.1 mmol/L (ref 3.5–5.1)
Sodium: 139 mmol/L (ref 135–145)
Total Bilirubin: 1 mg/dL (ref 0.0–1.2)
Total Protein: 7 g/dL (ref 6.5–8.1)

## 2023-07-08 LAB — CBC WITH DIFFERENTIAL/PLATELET
Abs Immature Granulocytes: 0.08 10*3/uL — ABNORMAL HIGH (ref 0.00–0.07)
Basophils Absolute: 0.1 10*3/uL (ref 0.0–0.1)
Basophils Relative: 1 %
Eosinophils Absolute: 0.2 10*3/uL (ref 0.0–0.5)
Eosinophils Relative: 2 %
HCT: 26.7 % — ABNORMAL LOW (ref 39.0–52.0)
Hemoglobin: 9.3 g/dL — ABNORMAL LOW (ref 13.0–17.0)
Immature Granulocytes: 1 %
Lymphocytes Relative: 24 %
Lymphs Abs: 2.5 10*3/uL (ref 0.7–4.0)
MCH: 34.4 pg — ABNORMAL HIGH (ref 26.0–34.0)
MCHC: 34.8 g/dL (ref 30.0–36.0)
MCV: 98.9 fL (ref 80.0–100.0)
Monocytes Absolute: 0.9 10*3/uL (ref 0.1–1.0)
Monocytes Relative: 8 %
Neutro Abs: 6.8 10*3/uL (ref 1.7–7.7)
Neutrophils Relative %: 64 %
Platelets: 247 10*3/uL (ref 150–400)
RBC: 2.7 MIL/uL — ABNORMAL LOW (ref 4.22–5.81)
RDW: 15.3 % (ref 11.5–15.5)
WBC: 10.5 10*3/uL (ref 4.0–10.5)
nRBC: 0 % (ref 0.0–0.2)

## 2023-07-08 LAB — ETHANOL: Alcohol, Ethyl (B): <10 mg/dL (ref <10)

## 2023-07-08 MED ORDER — OLANZAPINE 10 MG IM SOLR: 10.0000 mg | Freq: Once | INTRAMUSCULAR | Status: DC | PRN

## 2023-07-08 MED ORDER — OLANZAPINE 5 MG PO TBDP
10.0000 mg | ORAL_TABLET | Freq: Two times a day (BID) | ORAL | Status: DC | PRN
  Administered 2023-07-09 – 2023-07-13 (×4): 10 mg via ORAL
  Filled 2023-07-08 (×4): qty 2

## 2023-07-08 MED ORDER — KETOCONAZOLE 2 % EX CREA
1.0000 | TOPICAL_CREAM | Freq: Every day | CUTANEOUS | 2 refills | Status: DC
  Filled 2023-07-08: qty 60, 30d supply, fill #0

## 2023-07-08 MED ORDER — OLANZAPINE 5 MG PO TBDP
10.0000 mg | ORAL_TABLET | Freq: Every day | ORAL | Status: DC
  Administered 2023-07-10 – 2023-07-11 (×2): 10 mg via ORAL
  Filled 2023-07-08 (×3): qty 2

## 2023-07-08 MED ORDER — ACETAMINOPHEN 325 MG PO TABS: 650.0000 mg | ORAL_TABLET | Freq: Four times a day (QID) | ORAL | Status: DC | PRN

## 2023-07-08 MED ORDER — ZIPRASIDONE MESYLATE 20 MG IM SOLR: 20.0000 mg | Freq: Once | INTRAMUSCULAR | Status: DC | PRN

## 2023-07-08 MED ORDER — ISOSORBIDE MONONITRATE ER 30 MG PO TB24
30.0000 mg | ORAL_TABLET | Freq: Every day | ORAL | Status: DC
  Administered 2023-07-10 – 2023-07-14 (×5): 30 mg via ORAL
  Filled 2023-07-08 (×5): qty 1

## 2023-07-08 MED ORDER — SUCROFERRIC OXYHYDROXIDE 500 MG PO CHEW
1000.0000 mg | CHEWABLE_TABLET | Freq: Three times a day (TID) | ORAL | Status: DC
  Administered 2023-07-10 – 2023-07-14 (×8): 1000 mg via ORAL
  Filled 2023-07-08 (×19): qty 2

## 2023-07-08 MED ORDER — ROSUVASTATIN CALCIUM 5 MG PO TABS
5.0000 mg | ORAL_TABLET | Freq: Every day | ORAL | Status: DC
Start: 2023-07-09 — End: 2023-07-15
  Administered 2023-07-10 – 2023-07-14 (×5): 5 mg via ORAL
  Filled 2023-07-08 (×5): qty 1

## 2023-07-08 MED ORDER — BENZTROPINE MESYLATE 1 MG PO TABS: 1.0000 mg | ORAL_TABLET | Freq: Two times a day (BID) | ORAL | Status: DC | PRN

## 2023-07-08 NOTE — ED Notes (Signed)
 Pt is unable to void at this time.  Pt states that he has voided today and as he is a HD pt he will not be able to void.  [employer] and Behavioral Health response team as pt has intermittent verbal outbursts

## 2023-07-08 NOTE — ED Notes (Signed)
 Per pt he had his last HD treatment on Friday, he did miss yesterday (Monday)

## 2023-07-08 NOTE — ED Triage Notes (Signed)
 Pt was brought in by [employer] under IVC.  IVC paperwork states that pt has schizophrenia and is not taking his medications.  Pt believes that he can spiritually lead the country and is waiting to receive a message from god to begin "a slaughter".  Per IVC paperwork he has posted youtube videos about this where he went on to call for men to buy swords and "seize men and women who are/support trans people and feminism and chop them into pieces and let the birds of the air take them".  Pt has bought a sword for this purpose.

## 2023-07-08 NOTE — ED Notes (Signed)
 Pt has violent outbursts in triage, screaming

## 2023-07-08 NOTE — ED Notes (Signed)
 Pt was dressed out and wanded by security.  Pt belongings bagged and placed in locker 3

## 2023-07-08 NOTE — ED Notes (Signed)
 IVC: ENVELOPE # [id number] CASE NUMBER: ORIGINAL IN RED FOLDER COPY IN MEDICAL RECORDS 3 COPIES ON CLIPBOARD IN [place]

## 2023-07-08 NOTE — ED Notes (Signed)
 TTS in process

## 2023-07-08 NOTE — Patient Instructions (Signed)
 VISIT SUMMARY:  Today, you were seen for issues related to dry, scaling skin on your feet and thickened toenails. We discussed your symptoms, including the burning sensation and difficulty in managing your toenails. A treatment plan was established to address these concerns.  YOUR PLAN:  -TINEA PEDIS: Tinea Pedis, commonly known as athlete's foot, is a fungal infection that causes dry, scaling skin, itching, and burning sensations. You have been prescribed ketoconazole cream to apply twice daily to the affected areas, especially between the toes and on the bottom of your feet. Additionally, you should keep your feet dry and allow your shoes to dry thoroughly to help manage the condition.  -ONYCHOMYCOSIS: Onychomycosis is a fungal infection of the toenails, leading to thickened and dystrophic nails. Due to the severity and the ineffectiveness of topical treatments, regular podiatric care is necessary. Your nails will be trimmed and managed every three months to help control the condition.  INSTRUCTIONS:  Please apply the ketoconazole cream twice daily to the affected areas on your feet. Ensure your feet are thoroughly dried and allow your shoes to dry properly. Schedule an appointment for nail care every three months to manage your toenail condition. If you have any difficulty picking up prescriptions, arrangements can be made to have them mailed to your house.

## 2023-07-08 NOTE — [facility] Counselor (Signed)
 TTS Note:    @ 9:15 PM-Patient was deferred to [facility] Telehealth Care Coordinator (TCC), Marylene Land, at [phone]. The [facility] Care Coordinator will notify the patient's care team when the [facility] provider is ready to initiate the telehealth assessment. If there are any questions, please reach out to the [facility] Coordinator.

## 2023-07-08 NOTE — Consult Note (Addendum)
 Paul [name] Consult Note  Patient Name: Paul [name] MRN: [id number] DOB: 01-27-1966 DATE OF Consult: 07/08/2023  PRIMARY PSYCHIATRIC DIAGNOSES  1.  Schizoaffective disorder, bipolar type 2.  Rule out substance induced psychosis    RECOMMENDATIONS  Recommendations: Medication recommendations: Qtc 485, please monitor EKG and ensure Qtc < 500 when patient on antipsychotics. Recommend olanzapine 10mg  po nightly for psychosis/mood - no renal dosing needed with this medication; Recommend olanzapine 10mg  po BID PRN agitation; -Continue Geodon 20mg  IM PRN emergent agitation - 1st line - please monitor Qtc; Recommend olanzapine 10mg  IM PRN emergent agitation - 2nd line - total olanzapine dose not to exceed 30mg  in a 24 hour period Non-Medication/therapeutic recommendations: Psychiatric hospitalization  Is inpatient psychiatric hospitalization recommended for this patient? Yes (Explain why): Homicidal threats delusional, religiously preoccupied  Follow-Up [name] C/L services: We will continue to follow this patient with you until stabilized or discharged.  If you have any questions or concerns, please call our TeleCare Coordination service at  [phone] and ask for myself or the provider on-call. Communication: Treatment team members (and family members if applicable) who were involved in treatment/care discussions and planning, and with whom we spoke or engaged with via secure text/chat, include the following: Paul [name]  On evaluation, patient speaking loudly, rambling, delusional, religiously preoccupied, angry, disorganized, not appearing internally preoccupied, not responding to internal stimuli, alert and oriented x 4. Patient reports belief that he is a Psychologist, sport and exercise chosen by God. Reports he is meant to teach Americans who are "spiritually lazy". He reports, "Men are to buy swords for the great slaughter, " per chart, patient reportedly obtained a  sword, which he declines to discuss in evaluation. Reports that he is a "super genius." He makes vague threats to harm others, denies intent and plan. Per chart review is calling people to arm themselves to kill feminists and other groups of people. Patient's presentation is concerning for schizoaffective disorder, bipolar type with notable mood and psychotic symptoms. Patient at high risk for harm to others given anger, aggression, impulsivity and significant delusional thought content. Therefore, inpatient psychiatric hospitalization is recommended.   Thank you for involving [place] in the care of this patient. If you have any additional questions or concerns, please call [phone] and ask for me or the provider on-call.  [name] ATTESTATION & CONSENT  As the provider for this telehealth consult, I attest that I verified the patient's identity using two separate identifiers, introduced myself to the patient, provided my credentials, disclosed my location, and performed this encounter via a HIPAA-compliant, real-time, face-to-face, two-way, interactive audio and video platform and with the full consent and agreement of the patient (or guardian as applicable.)  Patient physical location: ED in [facility]  Telehealth provider physical location: home office in state of [place]   Video start time: 2311 EST Video end time: 2335    IDENTIFYING DATA  Paul [name] is a 58 y.o. year-old male for whom a psychiatric consultation has been ordered by the primary provider. The patient was identified using two separate identifiers.  CHIEF COMPLAINT/REASON FOR CONSULT  Homicidal threats  HISTORY OF PRESENT ILLNESS (HPI)  Paul [name] is a 58 year old male with a history of schizophrenia, bipolar disorder, CKD, CHF, hypertension who presents to the ED on an IVC after threatening to cut the heads off of women who work in the facility. Per chart review, patient with a history of violence.  Patient also reportedly posting YouTube videos "He was  calling for men to pick up Swords, kill women and feminists and anybody who supports trans rights, 'chop them up and feed them to the birds.'  Patient also has been stating that he has a calling from God to lead this nation to greatness and that he is the only person who can do it." Per chart review, has been having angry outbursts in the ED and also threatened to "put a syringe in the eye of the first psychiatrist who talks to him." Psychiatry consulted for medication management.   On evaluation, patient speaking loudly, rambling, delusional, religiously preoccupied, angry, disorganized, not appearing internally preoccupied, not responding to internal stimuli, alert and oriented x 4. Patient states, "You can't see something I'm the only one who can see it!" Patient states, "I want out of here right now! You psychiatrists want to give me thorazine and f*ck with my brain and I don't want it!" He states, "We need a national repentance and we can't service without it. I know God's law and this country should have been nuked in 2011!" He states to this Clinical research associate, "Especially as a woman you can't see it! My perception is that I am bad just because I am a man!" He then states, "I'm an INFJ personality type!" He states, "I knew a lot of people were going to die because of feminism. I knew back in 2004. Now nobody can talk to a woman because they don't recognize anything you're saying!" He reports, "I did what the holy spirit told me to do. I made a few videos." He states, "Nobody else in the country is listening to the [facility] like I am!" He states,"I have been trained by God to lead this nation spiritually so that can people can get back their soul in the world's chaos." He states,"In 2007 I've been obeying the voice of God. God is a terrorist." He states, "I was the one God picked since I was a little kid. He mentored me." He states, "In 2019 I could see what  Paul [name] saw." He reports he makes videos about "leading the nation spiritually." He states, "Men are to buy swords for the great slaughter. Jesus said that. Women find that threatening. Women won't listen to what I say." He reports he put that in a video. Declines to discuss if he obtained a sword. Patient denies depressed mood, other depressive symptoms. Denies suicidal ideation, intent, plan. Patient denies that he made homicidal threats at the dialysis clinic. Reports he needs to "listen to the word of the [facility] and perceive danger." Patient makes vague threats. Denies frank homicidal ideation, intent, plan. Patient states, "I made three videos and I guess they ruffled some feathers. I didn't do anything wrong!" He states, "When people understand what I'm saying, they'll say I'm a super genius."   PAST PSYCHIATRIC HISTORY  Current psych meds: Denies Prior psych meds: Per chart review, Haldol, Paxil  Outpatient: Denies Inpatient: Per chart review, multiple  Non-suicidal self injury: Unable to assess due to patient condition  Suicide attempts: Unable to assess due to patient condition  Violence: Per chart review, yes Drugs/alcohol: Unable to assess due to patient condition  Trauma/neglect/abuse: Unable to assess due to patient condition  Otherwise as per HPI above.  PAST MEDICAL HISTORY  Past Medical History:  Diagnosis Date   Bipolar disorder (HCC)    Central retinal artery occlusion    with macular infarction of the right eye. status post posterior vitrectomy and photocoagulation with insertion of  a shunt in 2006.   CHF (congestive heart failure) (HCC)    chronic mixed syst/diast. 02/2009 echo: Systolic function was mildly reduced. The estimated ejection fraction was in 45%, global HK, Grade I Diast dysfxn.   Chronic kidney disease    ESRD   Heart failure    Obesity    Osteoarthritis    s/p right hip replacement in 2001   Schizophrenia (HCC)    Severe uncontrolled hypertension       HOME MEDICATIONS  Facility Ordered Medications  Medication   ziprasidone (GEODON) injection 20 mg   [START ON 07/09/2023] isosorbide mononitrate (IMDUR) 24 hr tablet 30 mg   [START ON 07/09/2023] rosuvastatin (CRESTOR) tablet 5 mg   [START ON 07/09/2023] sucroferric oxyhydroxide (VELPHORO) chewable tablet 1,000 mg   acetaminophen (TYLENOL) tablet 650 mg   PTA Medications  Medication Sig   sucroferric oxyhydroxide (VELPHORO) 500 MG chewable tablet Chew 2 tablets (1,000 mg total) by mouth with breakfast, with lunch, and with evening meal.   rosuvastatin (CRESTOR) 5 MG tablet Take 1 tablet (5 mg total) by mouth daily.   isosorbide mononitrate (IMDUR) 30 MG 24 hr tablet Take 1 tablet (30 mg total) by mouth daily.   carvedilol (COREG) 6.25 MG tablet Take 1 tablet (6.25 mg total) by mouth 2 (two) times daily with a meal. (Patient not taking: Reported on 07/08/2023)   lidocaine-prilocaine (EMLA) cream Apply 1 a small amount to skin three times a week apply to AVF 30-60 min prior to dialysis, wrap site w plastic wrap (Patient not taking: Reported on 07/08/2023)   rosuvastatin (CRESTOR) 5 MG tablet Take 1 tablet by mouth once a day (Patient not taking: Reported on 07/08/2023)   polyethylene glycol powder (GLYCOLAX/MIRALAX) 17 GM/SCOOP powder Take 17 g by mouth daily. (Patient not taking: Reported on 07/08/2023)   sucroferric oxyhydroxide (VELPHORO) 500 MG chewable tablet Chew 3 tablets (1,500 mg total) by mouth 3 (three) times daily with meals. (Patient not taking: Reported on 07/08/2023)   isosorbide mononitrate (IMDUR) 60 MG 24 hr tablet Take 0.5 tablets (30 mg total) by mouth daily. (Patient not taking: Reported on 07/08/2023)     ALLERGIES  Allergies  Allergen Reactions   Penicillin G     SOCIAL & SUBSTANCE USE HISTORY  Social History   Socioeconomic History   Marital status: Legally Separated    Spouse name: Not on file   Number of children: Not on file   Years of education: Not on file   Highest  education level: Not on file  Occupational History   Occupation: Chartered certified accountant  Tobacco Use   Smoking status: Former   Smokeless tobacco: Never   Tobacco comments:    Quit smoking 20-30 years ago, smoked for 1 year.  Vaping Use   Vaping status: Never Used  Substance and Sexual Activity   Alcohol use: No   Drug use: No   Sexual activity: Yes    Birth control/protection: None  Other Topics Concern   Not on file  Social History Narrative   Not on file   Social Drivers of Health   Financial Resource Strain: Not on file  Food Insecurity: No Food Insecurity (07/01/2023)   Hunger Vital Sign    Worried About Running Out of Food in the Last Year: Never true    Ran Out of Food in the Last Year: Never true  Transportation Needs: No Transportation Needs (01/24/2023)   PRAPARE - Administrator, Civil Service (Medical): No  Lack of Transportation (Non-Medical): No  Physical Activity: Not on file  Stress: Not on file  Social Connections: Moderately Integrated (07/01/2023)   Social Connection and Isolation Panel [NHANES]    Frequency of Communication with Friends and Family: Three times a week    Frequency of Social Gatherings with Friends and Family: Never    Attends Religious Services: More than 4 times per year    Active Member of [employer] or Organizations: Yes    Attends Engineer, structural: More than 4 times per year    Marital Status: Divorced   Social History   Tobacco Use  Smoking Status Former  Smokeless Tobacco Never  Tobacco Comments   Quit smoking 20-30 years ago, smoked for 1 year.   Social History   Substance and Sexual Activity  Alcohol Use No   Social History   Substance and Sexual Activity  Drug Use No      FAMILY HISTORY  Family History  Problem Relation Age of Onset   Hypertension Father    Stroke Father    Family Psychiatric History (if known):  Unable to assess due to patient condition    MENTAL STATUS EXAM (MSE)  Mental Status  Exam: General Appearance: Casual  Orientation:  Full (Time, Place, and Person)  Memory:  Immediate;   Fair Recent;   Fair  Concentration:  Concentration: Fair  Recall:  Fair  Attention  Poor  Eye Contact:  Fair  Speech:  Pressured  Language:  Good  Volume:  Increased  Mood: Angry  Affect:  Labile  Thought Process:  Disorganized  Thought Content:  Delusions and Paranoid Ideation  Suicidal Thoughts:  No  Homicidal Thoughts:  Yes.  without intent/plan  Judgement:  Poor  Insight:  Lacking  Psychomotor Activity:  Normal  Akathisia:  NA  Fund of Knowledge:  Good    Assets:  Communication Skills  Cognition:  WNL  ADL's:  Intact  AIMS (if indicated):       VITALS  Blood pressure (!) 163/93, pulse 99, temperature 98.1 F (36.7 C), resp. rate 18, SpO2 99%.  LABS  Admission on 07/08/2023  Component Date Value Ref Range Status   Sodium 07/08/2023 139  135 - 145 mmol/L Final   Potassium 07/08/2023 4.1  3.5 - 5.1 mmol/L Final   Chloride 07/08/2023 97 (L)  98 - 111 mmol/L Final   CO2 07/08/2023 20 (L)  22 - 32 mmol/L Final   Glucose, Bld 07/08/2023 118 (H)  70 - 99 mg/dL Final   Glucose reference range applies only to samples taken after fasting for at least 8 hours.   BUN 07/08/2023 107 (H)  6 - 20 mg/dL Final   Creatinine, Ser 07/08/2023 14.91 (H)  0.61 - 1.24 mg/dL Final   Calcium 16/02/9603 8.4 (L)  8.9 - 10.3 mg/dL Final   Total Protein 54/01/8118 7.0  6.5 - 8.1 g/dL Final   Albumin 14/78/2956 3.6  3.5 - 5.0 g/dL Final   AST 21/30/8657 14 (L)  15 - 41 U/L Final   ALT 07/08/2023 18  0 - 44 U/L Final   Alkaline Phosphatase 07/08/2023 87  38 - 126 U/L Final   Total Bilirubin 07/08/2023 1.0  0.0 - 1.2 mg/dL Final   GFR, Estimated 07/08/2023 3 (L)  >60 mL/min Final   Comment: (NOTE) Calculated using the CKD-EPI Creatinine Equation (2021)    Anion gap 07/08/2023 22 (H)  5 - 15 Final   Comment: ELECTROLYTES REPEATED TO VERIFY Performed at  [facility], [address] [place], [place], [place] [address]    Alcohol, Ethyl (B) 07/08/2023 <10  <10 mg/dL Final   Comment: (NOTE) Lowest detectable limit for serum alcohol is 10 mg/dL.  For medical purposes only. Performed at [facility], [address] [place], [place] [address]    WBC 07/08/2023 10.5  4.0 - 10.5 K/uL Final   RBC 07/08/2023 2.70 (L)  4.22 - 5.81 MIL/uL Final   Hemoglobin 07/08/2023 9.3 (L)  13.0 - 17.0 g/dL Final   HCT 09/81/1914 26.7 (L)  39.0 - 52.0 % Final   MCV 07/08/2023 98.9  80.0 - 100.0 fL Final   MCH 07/08/2023 34.4 (H)  26.0 - 34.0 pg Final   MCHC 07/08/2023 34.8  30.0 - 36.0 g/dL Final   RDW 78/29/5621 15.3  11.5 - 15.5 % Final   Platelets 07/08/2023 247  150 - 400 K/uL Final   nRBC 07/08/2023 0.0  0.0 - 0.2 % Final   Neutrophils Relative % 07/08/2023 64  % Final   Neutro Abs 07/08/2023 6.8  1.7 - 7.7 K/uL Final   Lymphocytes Relative 07/08/2023 24  % Final   Lymphs Abs 07/08/2023 2.5  0.7 - 4.0 K/uL Final   Monocytes Relative 07/08/2023 8  % Final   Monocytes Absolute 07/08/2023 0.9  0.1 - 1.0 K/uL Final   Eosinophils Relative 07/08/2023 2  % Final   Eosinophils Absolute 07/08/2023 0.2  0.0 - 0.5 K/uL Final   Basophils Relative 07/08/2023 1  % Final   Basophils Absolute 07/08/2023 0.1  0.0 - 0.1 K/uL Final   Immature Granulocytes 07/08/2023 1  % Final   Abs Immature Granulocytes 07/08/2023 0.08 (H)  0.00 - 0.07 K/uL Final   Performed at [facility], [address], [place], [place] [address]    PSYCHIATRIC REVIEW OF SYSTEMS (ROS)  ROS: Notable for the following relevant positive findings: ROS Delusions Angry  Disorganized  Additional findings:      Musculoskeletal: No abnormal movements observed      Gait & Station: Laying/Sitting      Pain Screening: Denies      Nutrition & Dental Concerns: n/a  RISK FORMULATION/ASSESSMENT  Is the patient experiencing any suicidal or homicidal ideations: Yes       Explain if yes: Endorses vague homicidal threats, denies  intent, plan, target  Protective factors considered for safety management: History of violence, psychosis, impulsivity  Risk factors/concerns considered for safety management:  Physical illness/chronic pain Impulsivity Aggression Unwillingness to seek help Male gender Unmarried  Is there a safety management plan with the patient and treatment team to minimize risk factors and promote protective factors: Yes           Explain: Psychiatric hospitalization  Is crisis care placement or psychiatric hospitalization recommended: Yes     Based on my current evaluation and risk assessment, patient is determined at this time to be at:  High risk  *RISK ASSESSMENT Risk assessment is a dynamic process; it is possible that this patient's condition, and risk level, may change. This should be re-evaluated and managed over time as appropriate. Please re-consult psychiatric consult services if additional assistance is needed in terms of risk assessment and management. If your team decides to discharge this patient, please advise the patient how to best access emergency psychiatric services, or to call 911, if their condition worsens or they feel unsafe in any way.   Adria Dill, MD [name] Consult Services

## 2023-07-08 NOTE — ED Provider Notes (Addendum)
 [place] EMERGENCY DEPARTMENT AT [facility] Provider Note   CSN: [id number] Arrival date & time: 07/08/23  2022     History  Chief Complaint  Patient presents with   Medical Clearance    IVC    Paul [name] is a 58 y.o. male pt bib BHRT and [facility] under IVC for expressing mass homicidal wishes.  He was discharged from his dialysis clinic today after threatening to cut the heads of the women off in the facility.  Cording to the behavioral health team he was previously incarcerated for 6 years after attempted murder of his ex-wife.  Patient has an unknown last dialysis date..  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  He was calling for men to pick up Swords, kill women and feminist and anybody who supports trans rights, "chop them up and feed them to the birds."  Patient also has been stating that he has a calling from God to lead this nation to greatness and that he is the only person who can do it. In triage the patient is intermittently angry and explosive.  He apparently hates psychiatrists and is threatened to put a syringe in the eye of the first psychiatrist who talks to him. He is otherwise been calm and cooperative outside of angry verbal threats here in the emergency department.  He apparently has bought a sword for the express purpose of killing people en [place].  HPI     Home Medications Prior to Admission medications   Medication Sig Start Date End Date Taking? Authorizing Provider  carvedilol (COREG) 6.25 MG tablet Take 1 tablet (6.25 mg total) by mouth 2 (two) times daily with a meal. 06/15/22 06/22/23  Estanislado Emms, MD  isosorbide mononitrate (IMDUR) 30 MG 24 hr tablet Take 1 tablet (30 mg total) by mouth daily. 05/16/23     isosorbide mononitrate (IMDUR) 60 MG 24 hr tablet Take 0.5 tablets (30 mg total) by mouth daily. 05/09/23   Marcine Matar, MD  ketoconazole (NIZORAL) 2 % cream Apply 1 Application topically daily. 07/08/23   Edwin Cap, DPM  lidocaine-prilocaine (EMLA) cream Apply 1 a small amount to skin three times a week apply to AVF 30-60 min prior to dialysis, wrap site w plastic wrap 09/12/22     polyethylene glycol powder (GLYCOLAX/MIRALAX) 17 GM/SCOOP powder Take 17 g by mouth daily. 01/29/23   Regalado, Jon Billings A, MD  rosuvastatin (CRESTOR) 5 MG tablet Take 1 tablet by mouth once a day 11/21/22     rosuvastatin (CRESTOR) 5 MG tablet Take 1 tablet (5 mg total) by mouth daily. 03/24/23     sucroferric oxyhydroxide (VELPHORO) 500 MG chewable tablet Chew 2 tablets (1,000 mg total) by mouth with breakfast, with lunch, and with evening meal. 08/08/22     sucroferric oxyhydroxide (VELPHORO) 500 MG chewable tablet Chew 3 tablets (1,500 mg total) by mouth 3 (three) times daily with meals. 04/02/23         Allergies    Penicillin g    Review of Systems   Review of Systems  Physical Exam Updated Vital Signs BP (!) 163/93 (BP Location: Left Arm)   Pulse 99   Temp 98.1 F (36.7 C)   Resp 18   SpO2 99%  Physical Exam Vitals and nursing note reviewed.  Constitutional:      General: He is not in acute distress.    Appearance: He is well-developed. He is not diaphoretic.  HENT:  Head: Normocephalic and atraumatic.  Eyes:     General: No scleral icterus.    Conjunctiva/sclera: Conjunctivae normal.  Cardiovascular:     Rate and Rhythm: Normal rate and regular rhythm.     Heart sounds: Normal heart sounds.  Pulmonary:     Effort: Pulmonary effort is normal. No respiratory distress.     Breath sounds: Normal breath sounds.  Abdominal:     Palpations: Abdomen is soft.     Tenderness: There is no abdominal tenderness.  Musculoskeletal:     Cervical back: Normal range of motion and neck supple.  Skin:    General: Skin is warm and dry.  Neurological:     Mental Status: He is alert.  Psychiatric:        Mood and Affect: Affect is labile and angry.        Behavior: Behavior normal.        Thought Content:  Thought content is paranoid and delusional. Thought content includes homicidal ideation. Thought content includes homicidal plan.     ED Results / Procedures / Treatments   Labs (all labs ordered are listed, but only abnormal results are displayed) Labs Reviewed  COMPREHENSIVE METABOLIC PANEL - Abnormal; Notable for the following components:      Result Value   Chloride 97 (*)    CO2 20 (*)    Glucose, Bld 118 (*)    BUN 107 (*)    Creatinine, Ser 14.91 (*)    Calcium 8.4 (*)    AST 14 (*)    GFR, Estimated 3 (*)    Anion gap 22 (*)    All other components within normal limits  CBC WITH DIFFERENTIAL/PLATELET - Abnormal; Notable for the following components:   RBC 2.70 (*)    Hemoglobin 9.3 (*)    HCT 26.7 (*)    MCH 34.4 (*)    Abs Immature Granulocytes 0.08 (*)    All other components within normal limits  ETHANOL  RAPID URINE DRUG SCREEN, HOSP PERFORMED    EKG None  Radiology No results found.  Procedures Procedures    Medications Ordered in ED Medications  ziprasidone (GEODON) injection 20 mg (has no administration in time range)    ED Course/ Medical Decision Making/ A&P Clinical Course as of 07/08/23 2219  Tue Jul 08, 2023  2219 Case discussed with Dr. Glenna Fellows- Nephrology is aware of the patient and will round on him tomorrow. [AH]    Clinical Course User Index [AH] [name], PA-C                                 Medical Decision Making Amount and/or Complexity of Data Reviewed Labs: ordered.  Risk Prescription drug management.   Labs 2 g drop in hgb !2.7>>9.3 Potassium 4.1, glucose 118, BUN 107 creatinine 14.91 EG shows sinus rhythm at a rate of 99 without peaked T waves.  Currently have a call placed to nephrology for consult on this patient.  He will need dialysis set up for his stay here in the emergency department.  I filled out the first exam paperwork and signed paperwork for 7 days.  He is currently otherwise medically stable but  will need repeat labs to monitor his medical situation due to need for chronic dialysis.   10:20 PM Patient is medically clear, Nephrology will consult tomorrow.     Final Clinical Impression(s) / ED Diagnoses Final diagnoses:  Homicidal  behavior    Rx / DC Orders ED Discharge Orders     None         [name], PA-C 07/08/23 2151    [name], PA-C 07/08/23 2220    Durwin Glaze, MD 07/08/23 [phone]

## 2023-07-08 NOTE — ED Provider Triage Note (Signed)
 Emergency Medicine Provider Triage Evaluation Note  Paul [name] , a 58 y.o. male  was evaluated in triage.  Pt then under on involuntary commitment, B heart was called out to his  dialysis clinic after patient threatened to cut the heads off of the women in the facility.  He has a YouTube channel where he has placed multiple videos calling for men to pick up Swords and kill women and feminist and anybody who supports trans rights and feed them to the birds.  He has a history of previous 6-year incarceration for attempted murder of his ex-wife.  He is end-stage renal disease and dialyzes every other day.  He is also currently threatening to put the syringe through the officers I here in the triage area.  He is intermittently explosively angry and otherwise calm  Review of Systems  Positive: Mass homicidal ideation  Negative: fever  Physical Exam  BP (!) 163/93 (BP Location: Left Arm)   Pulse 99   Temp 98.1 F (36.7 C)   Resp 18   SpO2 99%  Gen:   Awake, no distress   Resp:  Normal effort  MSK:   Moves extremities without difficulty  Other:    Medical Decision Making  Medically screening exam initiated at 8:51 PM.  Appropriate orders placed.  Paul [name] was informed that the remainder of the evaluation will be completed by another provider, this initial triage assessment does not replace that evaluation, and the importance of remaining in the ED until their evaluation is complete.     [name], PA-C 07/08/23 2053

## 2023-07-09 ENCOUNTER — Encounter (HOSPITAL_COMMUNITY): Payer: Self-pay | Admitting: Nephrology

## 2023-07-09 DIAGNOSIS — E8779 Other fluid overload: Secondary | ICD-10-CM | POA: Diagnosis not present

## 2023-07-09 DIAGNOSIS — N2581 Secondary hyperparathyroidism of renal origin: Secondary | ICD-10-CM | POA: Diagnosis not present

## 2023-07-09 DIAGNOSIS — Z992 Dependence on renal dialysis: Secondary | ICD-10-CM | POA: Diagnosis not present

## 2023-07-09 DIAGNOSIS — R45850 Homicidal ideations: Secondary | ICD-10-CM | POA: Diagnosis not present

## 2023-07-09 DIAGNOSIS — D631 Anemia in chronic kidney disease: Secondary | ICD-10-CM | POA: Diagnosis not present

## 2023-07-09 DIAGNOSIS — I120 Hypertensive chronic kidney disease with stage 5 chronic kidney disease or end stage renal disease: Secondary | ICD-10-CM | POA: Diagnosis not present

## 2023-07-09 DIAGNOSIS — N186 End stage renal disease: Secondary | ICD-10-CM | POA: Diagnosis not present

## 2023-07-09 DIAGNOSIS — F250 Schizoaffective disorder, bipolar type: Secondary | ICD-10-CM | POA: Diagnosis not present

## 2023-07-09 LAB — RENAL FUNCTION PANEL
Albumin: 3.1 g/dL — ABNORMAL LOW (ref 3.5–5.0)
Anion gap: 23 — ABNORMAL HIGH (ref 5–15)
BUN: 113 mg/dL — ABNORMAL HIGH (ref 6–20)
CO2: 19 mmol/L — ABNORMAL LOW (ref 22–32)
Calcium: 7.9 mg/dL — ABNORMAL LOW (ref 8.9–10.3)
Chloride: 97 mmol/L — ABNORMAL LOW (ref 98–111)
Creatinine, Ser: 16.05 mg/dL — ABNORMAL HIGH (ref 0.61–1.24)
GFR, Estimated: 3 mL/min — ABNORMAL LOW (ref >60)
Glucose, Bld: 124 mg/dL — ABNORMAL HIGH (ref 70–99)
Phosphorus: 9.5 mg/dL — ABNORMAL HIGH (ref 2.5–4.6)
Potassium: 4.5 mmol/L (ref 3.5–5.1)
Sodium: 139 mmol/L (ref 135–145)

## 2023-07-09 LAB — CBC
HCT: 23.6 % — ABNORMAL LOW (ref 39.0–52.0)
Hemoglobin: 8.2 g/dL — ABNORMAL LOW (ref 13.0–17.0)
MCH: 34.2 pg — ABNORMAL HIGH (ref 26.0–34.0)
MCHC: 34.7 g/dL (ref 30.0–36.0)
MCV: 98.3 fL (ref 80.0–100.0)
Platelets: 208 10*3/uL (ref 150–400)
RBC: 2.4 MIL/uL — ABNORMAL LOW (ref 4.22–5.81)
RDW: 15 % (ref 11.5–15.5)
WBC: 6.5 10*3/uL (ref 4.0–10.5)
nRBC: 0 % (ref 0.0–0.2)

## 2023-07-09 LAB — HEPATITIS B SURFACE ANTIGEN: Hepatitis B Surface Ag: NONREACTIVE

## 2023-07-09 MED ORDER — STERILE WATER FOR INJECTION IJ SOLN
INTRAMUSCULAR | Status: AC
  Administered 2023-07-09: 1.2 mL
  Filled 2023-07-09: qty 10

## 2023-07-09 MED ORDER — HEPARIN SODIUM (PORCINE) 1000 UNIT/ML DIALYSIS: 2500.0000 [IU] | Freq: Once | INTRAMUSCULAR | Status: DC

## 2023-07-09 MED ORDER — DIPHENHYDRAMINE HCL 25 MG PO CAPS: 25.0000 mg | ORAL_CAPSULE | Freq: Four times a day (QID) | ORAL | Status: DC | PRN

## 2023-07-09 MED ORDER — ZIPRASIDONE MESYLATE 20 MG IM SOLR
20.0000 mg | Freq: Once | INTRAMUSCULAR | Status: AC
  Administered 2023-07-09: 20 mg via INTRAMUSCULAR
  Filled 2023-07-09: qty 20

## 2023-07-09 MED ORDER — CHLORHEXIDINE GLUCONATE CLOTH 2 % EX PADS: 6.0000 | MEDICATED_PAD | Freq: Every day | CUTANEOUS | Status: DC

## 2023-07-09 MED ORDER — HEPARIN SODIUM (PORCINE) 1000 UNIT/ML DIALYSIS: 1500.0000 [IU] | INTRAMUSCULAR | Status: DC | PRN

## 2023-07-09 NOTE — ED Notes (Signed)
 Patient had already returned to unit from Dialysis; Pt's restraints was removed prior to my arrival to unit at 1900; Pt currently sleeping; No immediate need for restraint order renewal but will monitor for need; [employer] and security in unit for support if needed due to extreme dangerous impulsive behaviors-Monique,RN

## 2023-07-09 NOTE — Consult Note (Addendum)
 [facility] Psychiatric Consult Follow-up  Patient Name: .REDMOND [name]  MRN: [id number]  DOB: 1965-11-21  Consult Order details:  Orders (From admission, onward)     Start     Ordered   07/08/23 2026  CONSULT TO CALL ACT TEAM       Ordering Provider: [name], PA-C  Provider:  (Not yet assigned)  Question:  Reason for Consult?  Answer:  Psych consult   07/08/23 2025             Mode of Visit: In person    Psychiatry Consult Evaluation  Service Date: July 09, 2023 LOS:  LOS: 0 days  Chief Complaint: "God downloaded a message to the holy ghost on January 20th"  Primary Psychiatric Diagnoses  Schizoaffective disorder, bipolar type (HCC)  Assessment   Per. Dr. Gilman Schmidt, MD  Paul [name] is a 58 year old male with a history of schizophrenia, bipolar disorder, CKD, CHF, hypertension who presents to the ED on an IVC after threatening to cut the heads off of women who work in the facility. Per chart review, patient with a history of violence. Patient also reportedly posting YouTube videos "He was calling for men to pick up Swords, kill women and feminists and anybody who supports trans rights, 'chop them up and feed them to the birds.'  Patient also has been stating that he has a calling from God to lead this nation to greatness and that he is the only person who can do it." Per chart review, has been having angry outbursts in the ED and also threatened to "put a syringe in the eye of the first psychiatrist who talks to him." Psychiatry consulted for medication management.   Upon reevaluation, patient continues to present with delusional themes of religiosity and grandiosity, vague expressions of homicidal ideations to harm others, with poor insight/judgment, thus the recommendation remains for inpatient mental health hospitalization at this time.  Diagnoses:  Active Hospital problems: Principal Problem:   Schizoaffective disorder, bipolar type (HCC)    Plan   ##  Psychiatric Medication Recommendations:   -Recommend continue olanzapine 10 mg p.o. nightly  ## Medical Decision Making Capacity: Not specifically addressed in this encounter  ## Further Work-up: UDS/UA  ## Disposition:-- We recommend inpatient psychiatric hospitalization after medical hospitalization. Patient has been involuntarily committed on 07/08/2023.   ## Behavioral / Environmental: -Agitation/safety precautions    ## Safety and Observation Level:  - Based on my clinical evaluation, I estimate the patient to be at low risk of self harm in the current setting. - At this time, we recommend  1:1 Observation. This decision is based on my review of the chart including patient's history and current presentation, interview of the patient, mental status examination, and consideration of suicide risk including evaluating suicidal ideation, plan, intent, suicidal or self-harm behaviors, risk factors, and protective factors. This judgment is based on our ability to directly address suicide risk, implement suicide prevention strategies, and develop a safety plan while the patient is in the clinical setting. Please contact our team if there is a concern that risk level has changed.  CSSR Risk Category:C-SSRS RISK CATEGORY: No Risk  Suicide Risk Assessment: Patient has following modifiable risk factors for suicide: social isolation, recklessness, and medication noncompliance, access to a sword, which we are addressing by treatment recommendations. Patient has following non-modifiable or demographic risk factors for suicide: male gender and psychiatric hospitalization Patient has the following protective factors against suicide: Access to outpatient mental health care,  Cultural, spiritual, or religious beliefs that discourage suicide, no history of suicide attempts, and no history of NSSIB  Thank you for this consult request. Recommendations have been communicated to the primary team.  We will  continue to follow at this time.   Lenox Ponds, NP     History of Present Illness   Per. Dr. Gilman Schmidt, MD  Paul [name] is a 58 year old male with a history of schizophrenia, bipolar disorder, CKD, CHF, hypertension who presents to the ED on an IVC after threatening to cut the heads off of women who work in the facility. Per chart review, patient with a history of violence. Patient also reportedly posting YouTube videos "He was calling for men to pick up Swords, kill women and feminists and anybody who supports trans rights, 'chop them up and feed them to the birds.'  Patient also has been stating that he has a calling from God to lead this nation to greatness and that he is the only person who can do it." Per chart review, has been having angry outbursts in the ED and also threatened to "put a syringe in the eye of the first psychiatrist who talks to him." Psychiatry consulted for medication management.   Patient seen today at the [facility] emergency department for face-to-face psychiatric reevaluation.  Upon reevaluation, patient endorses that, "this is all a misunderstanding", states that he does not need to be here in the hospital.   Expanding on this, patient endorses that he has been hearing from the [place] for approximately 14 years now, but recently on May 26, 2023, when Trump was at the inauguration to accept the presidency, states that, "the [employer] for me, to the holy ghost inside of me, to spread to the world, and get people ready for the great day of judgment and slaughter that is to come", and that because of telling others this, states that, "a man in a green shirt had me involuntarily committed and brought here, but this whole thing is again, a misunderstanding".   Patient asked to expand on what he means by, "great day of judgment and slaughter", states again that the reports are all a, "misunderstanding", and that he does not need to be here, then begins  telling this Clinical research associate about, "revelations" that he has received like, "Moses and Jeri [place]", and begins to discuss vaguely plans he has received from, "the [place]" about men needing to buy swords, how feminist will "surely die" in the "slaughter to come", and I, "made a bunch of videos that I edited, directed, and wrote out in a three part series that I put on YouTube, but that is a really long story... basically god has told me that other people are not going to understand, but that I need to try and relay a message of the great day that is to come, to try to get people to repent nationally, because I am the only person that can do it".  Patient asked directly about homicidal ideations, and previous statements made, to which the patient at this time vaguely expresses homicidal ideations indirectly towards, "well, look, when judgment comes, the Lord's not playing games, slaughter will happen, I have my part in the great day to come".  Patient endorses no current auditory or visual hallucinations, denies hearing reports from the [place] during evaluation, and additionally states, "I have not had a message downloaded since 22nd January".  Objectively, patient does not appear to be responding to internal and/or external  stimuli.  Patient endorses no suicidal ideations.  Patient orientation is intact, no concerns for fluctuations in consciousness.  Patient endorses a frustrated mood, with an oddly constricted to neutral affect, disheveled presentation, and a atypical interpersonal style.  Patient endorses he has been eating and sleeping normally.  Patient endorses good toleration of medications, no appreciable side effects.  Per chart review/nursing: Patient earlier today had an incident of destroying a nursing station computer, which prompted multiple law enforcement to be needed to restrain the patient for severe agitation/psychosis so emergent medications could be given, and ultimately the patient needing to be placed  in 4 point restraints for safety.  Patient since severe incident earlier has been showing improvements, has been amenable to oral medications prescribed, has been more directable by staff, has been eating, and has been able to sleep.   Review of Systems  Psychiatric/Behavioral:  Positive for hallucinations (Reports that God has been downloading messages to the holy ghost that is inside of him). Negative for depression, substance abuse and suicidal ideas. The patient is not nervous/anxious and does not have insomnia.   All other systems reviewed and are negative.    Psychiatric and Social History  Psychiatric History:  Information collected from chart review/patient/IVC paperwork  Prev Dx/Sx: Schizophrenia, schizoaffective disorder, bipolar Current Psych Provider: Unknown Home Meds (current): Unknown Previous Med Trials: Haldol, Paxil Therapy: None endorsed  Prior Psych Hospitalization: Yes, per patient Prior Self Harm: Unknown Prior Violence: Yes, per chart review, history of violence, history of endorsing needing to kill others  Family Psych History: Unknown Family Hx suicide: Unknown  Social History:  Developmental Hx: Unknown Educational Hx: Unknown Occupational Hx: Unknown Legal Hx: Involuntary commitment Living Situation: Lives in a halfway house Spiritual Hx: Christian  Access to weapons/lethal means: Reportedly has a sword recently purchased  Substance History  Alcohol: None endorsed or reported Type of alcohol : None endorsed or reported Last Drink : None endorsed or reported Number of drinks per day : None endorsed or reported History of alcohol withdrawal seizures : None endorsed or reported History of DT's : None endorsed or reported Tobacco: : Reportedly smoked 30 years ago, smoked for 1 year Illicit drugs: : None endorsed or reported Prescription drug abuse: : None endorsed or reported Rehab hx: : None endorsed or reported  Exam Findings  Physical Exam:  As below Vital Signs:  Temp:  [98.1 F (36.7 C)] 98.1 F (36.7 C) (03/04 2027) Pulse Rate:  [99] 99 (03/04 2027) Resp:  [18] 18 (03/04 2027) BP: (163)/(93) 163/93 (03/04 2027) SpO2:  [99 %] 99 % (03/04 2027) Blood pressure (!) 163/93, pulse 99, temperature 98.1 F (36.7 C), resp. rate 18, SpO2 99%. There is no height or weight on file to calculate BMI.  Physical Exam Vitals and nursing note reviewed.  Constitutional:      General: He is not in acute distress.    Appearance: He is obese. He is not ill-appearing, toxic-appearing or diaphoretic.     Comments: Atypical interpersonal style  Pulmonary:     Effort: Pulmonary effort is normal.  Skin:    General: Skin is warm and dry.  Neurological:     Mental Status: He is alert and oriented to person, place, and time.     Motor: No tremor or seizure activity.  Psychiatric:        Attention and Perception: Attention normal.        Behavior: Behavior is cooperative.        Thought  Content: Thought content is delusional. Thought content is not paranoid. Thought content includes homicidal ideation. Thought content does not include suicidal ideation.        Cognition and Memory: Cognition and memory normal.        Judgment: Judgment is impulsive and inappropriate.     Mental Status Exam: General Appearance: Bizarre, Disheveled, and atypical interpersonal style  Orientation:  Full (Time, Place, and Person)  Memory:   Variable, largely intact  Concentration:  Concentration: Fair and Attention Span: Fair  Recall:  Fair  Attention  Fair  Eye Contact: Variable to fair  Speech: Clear and coherent, but with disruption and/or irregular flow speech (I.e., frequently with noticeable pauses, hesitations, or breaks in natural rhythm)  Language:  Fair  Volume:  Normal  Mood: Frustrated  Affect:   Oddly constricted to neutral  Thought Process:  Goal Directed and Linear superficially to circumstantial with frequent loosening of associations  and/or thought derailments  Thought Content:  Delusions; grandiose and religious  Suicidal Thoughts:  No  Homicidal Thoughts:  Yes.  without intent/plan  Judgement:  Impaired  Insight:  Lacking  Psychomotor Activity:  Normal  Akathisia:  No  Fund of Knowledge:  Fair      Assets:  Health and safety inspector Housing Leisure Time Physical Health Resilience  Cognition:  WNL  ADL's:  Intact  AIMS (if indicated):   0     Other History   These have been pulled in through the EMR, reviewed, and updated if appropriate.  Family History:  The patient's family history includes Hypertension in his father; Stroke in his father.  Medical History: Past Medical History:  Diagnosis Date  . Bipolar disorder (HCC)   . Central retinal artery occlusion    with macular infarction of the right eye. status post posterior vitrectomy and photocoagulation with insertion of a shunt in 2006.  Marland Kitchen CHF (congestive heart failure) (HCC)    chronic mixed syst/diast. 02/2009 echo: Systolic function was mildly reduced. The estimated ejection fraction was in 45%, global HK, Grade I Diast dysfxn.  . Chronic kidney disease    ESRD  . Heart failure   . Obesity   . Osteoarthritis    s/p right hip replacement in 2001  . Schizophrenia (HCC)   . Severe uncontrolled hypertension     Surgical History: Past Surgical History:  Procedure Laterality Date  . AV FISTULA PLACEMENT Right 07/08/2022   Procedure: RIGHT RADIOCEPHALIC ARTERIOVENOUS (AV) FISTULA CREATION;  Surgeon: Victorino Sparrow, MD;  Location[facility] OR;  Service: Vascular;  Laterality: Right;  . IR FLUORO GUIDE CV LINE RIGHT  05/13/2022  . IR US GUIDE VASC ACCESS RIGHT  05/13/2022  . JOINT REPLACEMENT     right hip replacement  . right eye surgery     for glaucoma  . TOTAL HIP ARTHROPLASTY Bilateral 1997, 2001     Medications:   Current Facility-Administered Medications:  .  acetaminophen (TYLENOL) tablet 650 mg, 650 mg, Oral, Q6H PRN, Arabella Merles, PA-C .  benztropine (COGENTIN) tablet 1 mg, 1 mg, Oral, BID PRN, Adria Dill, MD .  Chlorhexidine Gluconate Cloth 2 % PADS 6 each, 6 each, Topical, Q0600, Delano Metz, MD .  heparin injection 1,500 Units, 1,500 Units, Dialysis, PRN, Delano Metz, MD .  heparin injection 2,500 Units, 2,500 Units, Dialysis, Once in dialysis, Delano Metz, MD .  isosorbide mononitrate (IMDUR) 24 hr tablet 30 mg, 30 mg, Oral, Daily, Franaszek, Amanda, PA-C .  OLANZapine (ZYPREXA) injection 10 mg,  10 mg, Intramuscular, Once PRN, Feller, Sophie C, MD .  OLANZapine zydis (ZYPREXA) disintegrating tablet 10 mg, 10 mg, Oral, QHS, Feller, Sophie C, MD .  OLANZapine zydis (ZYPREXA) disintegrating tablet 10 mg, 10 mg, Oral, BID PRN, Adria Dill, MD, 10 mg at 07/09/23 1310 .  rosuvastatin (CRESTOR) tablet 5 mg, 5 mg, Oral, Daily, Franaszek, Amanda, PA-C .  sucroferric oxyhydroxide [facility]) chewable tablet 1,000 mg, 1,000 mg, Oral, TID with meals, Truddie Hidden, Amanda, PA-C .  ziprasidone (GEODON) injection 20 mg, 20 mg, Intramuscular, Once PRN, [name], PA-C  Current Outpatient Medications:  .  isosorbide mononitrate (IMDUR) 30 MG 24 hr tablet, Take 1 tablet (30 mg total) by mouth daily., Disp: 90 tablet, Rfl: 3 .  rosuvastatin (CRESTOR) 5 MG tablet, Take 1 tablet (5 mg total) by mouth daily., Disp: 90 tablet, Rfl: 3 .  sucroferric oxyhydroxide (VELPHORO) 500 MG chewable tablet, Chew 2 tablets (1,000 mg total) by mouth with breakfast, with lunch, and with evening meal., Disp: 180 tablet, Rfl: 11 .  carvedilol (COREG) 6.25 MG tablet, Take 1 tablet (6.25 mg total) by mouth 2 (two) times daily with a meal. (Patient not taking: Reported on 07/08/2023), Disp: 60 tablet, Rfl: 12 .  isosorbide mononitrate (IMDUR) 60 MG 24 hr tablet, Take 0.5 tablets (30 mg total) by mouth daily. (Patient not taking: Reported on 07/08/2023), Disp: 15 tablet, Rfl: 0 .  ketoconazole (NIZORAL) 2 % cream, Apply 1 Application  topically daily. (Patient not taking: Reported on 07/08/2023), Disp: 60 g, Rfl: 2 .  lidocaine-prilocaine (EMLA) cream, Apply 1 a small amount to skin three times a week apply to AVF 30-60 min prior to dialysis, wrap site w plastic wrap (Patient not taking: Reported on 07/08/2023), Disp: 30 g, Rfl: 11 .  polyethylene glycol powder (GLYCOLAX/MIRALAX) 17 GM/SCOOP powder, Take 17 g by mouth daily. (Patient not taking: Reported on 07/08/2023), Disp: 238 g, Rfl: 0 .  rosuvastatin (CRESTOR) 5 MG tablet, Take 1 tablet by mouth once a day (Patient not taking: Reported on 07/08/2023), Disp: 30 tablet, Rfl: 11 .  sucroferric oxyhydroxide (VELPHORO) 500 MG chewable tablet, Chew 3 tablets (1,500 mg total) by mouth 3 (three) times daily with meals. (Patient not taking: Reported on 07/08/2023), Disp: 810 tablet, Rfl: 3  Allergies: Allergies  Allergen Reactions  . Penicillin Howard Pouch, NP

## 2023-07-09 NOTE — Progress Notes (Signed)
 Contacted by pt's out-pt nephrologist. Pt has been d/c from clinic and currently has no place to receive out-pt HD. Pt's chart reviewed and discussed with nephrologist. Clinic placement at this time will be very difficult due to safety concerns for staff and other patients. Will assist as able.   Olivia Canter Renal Navigator [phone]

## 2023-07-09 NOTE — ED Notes (Signed)
 IVC is current

## 2023-07-09 NOTE — Progress Notes (Signed)
 LCSW Progress Note:  Saahas Hidrogo MRN: [id number]  07/09/2023 7:33 PM  Arsenio Loader, NP, recommends inpatient psychiatric hospitalization after medical clearance. The patient is under voluntary admission; initiate IVC if they attempt to leave. Per Rona Ravens, RN, no [facility] beds are available. Clinician faxed the patient's information to alternative hospitals for bed placement. See hospitals listed.    Destination  Service Provider Request Status Services Address Phone Fax Patient Preferred  [facility] Pending - Request Sent -- [address], [place] [address] [phone] --  [facility]-Ad[facility] Pending - Request Sent -- [address] [place] [address] [phone] --  [facility] Center-Geriatric Pending - Request Sent -- [address] [place] [address] [phone] --  [facility] Pending - Request Sent -- [address] [place] [address] [phone] --  [facility] Pending - Request Sent -- [address] [place] [address] [phone] --  [facility] Pending - Request Sent -- [address], [place] [address] [phone] --  [facility] Pending - Request Sent -- [address] [place] [address] [phone] --  [facility] Pending - Request Sent -- [address] [place] [phone] --  [facility] Pending - Request Sent -- [address], [place] [address] [phone] --  [facility] Pending - Request Sent -- [address] [place] [address] [phone] --  [facility] Pending - Request Sent -- [address] [place] [address] [phone]  [phone] --

## 2023-07-09 NOTE — Progress Notes (Signed)
 LCSW Progress Note  [id number]   Paul [name]  07/09/2023  8:35 AM  Description:   Inpatient Psychiatric Referral  Patient was recommended inpatient per Wandra Feinstein MD (. There are no available beds at [facility], per [facility] Rona Ravens RN. Patient was referred to the following out of network facilities:    Destination  Service Provider Address Phone Fax  [facility] [address], [place] [address] [phone]  [facility] [address] [place], [place] [address] [phone]  [facility]-Ger[facility] [address] [place], [place] [address] [phone]  [facility] [address] [place] [address] [phone]  [facility] [address] [place] [address] [phone]  [facility] [address] [place] [address] [phone]  [facility] EFAX [address] [place], [place] [phone]  [facility] [address] [place] [address] [phone]  [facility] [address] [place] [address] [phone]  [facility] [address] [place] [address] [phone]      Situation ongoing, CSW to continue following and update chart as more information becomes available.     [place] Jayvier Burgher, MSW, LCSW  07/09/2023 8:35 AM

## 2023-07-09 NOTE — Progress Notes (Signed)
  Subjective:  Patient ID: Paul [name], male    DOB: [birth year],  MRN: [id number]  Chief Complaint  Patient presents with   Foot Pain    Patient states when he walks a lot his feet bother him, no medication for pain. Patient states that it is the bilateral feet where he has his pain. And he would also like to get his toe nails cut    Discussed the use of AI scribe software for clinical note transcription with the patient, who gave verbal consent to proceed.  History of Present Illness   Paul [name] is a 58 year old male on dialysis who presents with dry, scaling skin on the feet and thickened toenails. He was referred by a nurse for evaluation by a foot doctor.  He experiences very dry skin on the bottom of his feet, which he manages with Vaseline, providing some relief, though the dryness persists. He also notes a burning sensation that makes walking uncomfortable at times. Itching and burning sensations are attributed to the dry skin. He has been prescribed ketoconazole cream to apply twice daily to the affected areas, particularly between the toes and on the bottom of the feet.  He inquires about regular toenail trimming services due to difficulty managing his toenails. His toenails are thickened and difficult to trim, which he attributes to nail fungus. He mentions that his fingernails do not grow as fast as his toenails. He recalls his father also had his nails cut regularly, suggesting a possible familial tendency towards nail issues.  He is on dialysis and mentions that he does not drive, making it difficult to pick up prescriptions. He prefers them to be mailed to his house. No diabetes.          Objective:    Physical Exam   EXTREMITIES: Feet warm and well perfused. Capillary refill time normal. Severe dystrophic mycotic fungal toenails on all toes. SKIN: Dry scaling rash in moccasin distribution interdigitally and across soles and lateral feet.       No images are  attached to the encounter.    Results   Procedure: Nail debridement Description: Nails debrided length of thickness using a sharp nail nipper to a tolerable level. Severe dystrophic mycotic fungal toenails times ten.      Assessment:   1. Onychomycosis   2. Tinea pedis of both feet      Plan:  Patient was evaluated and treated and all questions answered.  Assessment and Plan    Tinea Pedis Dry, scaling skin in moccasin distribution with burning and itching. Exacerbated by dialysis affecting skin integrity. Topical antifungal therapy indicated due to contraindication of oral antifungals. - Prescribed ketoconazole cream twice daily to affected areas. - Advised on foot hygiene: dry feet thoroughly, allow shoes to dry. - Provided large tube of ketoconazole with refills for potential recurrence.  Onychomycosis Severe dystrophic mycotic toenails on all toes, related to tinea pedis. Oral antifungals contraindicated; topical treatments ineffective. Regular podiatric care required. - Debride nails with sharp nail nipper in length and thickness. - Schedule nail care every three months. - Educated on management of nail fungus and role of podiatric care.          Return in about 3 months (around 10/08/2023) for painful thick fungal nails.
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
## 2023-07-09 NOTE — Progress Notes (Signed)
   07/09/23 1817  Vitals  Temp 97.8 F (36.6 C)  Pulse Rate 86  Resp 14  BP 139/84  SpO2 100 %  O2 Device Room Air  Oxygen Therapy  Patient Activity (if Appropriate) In bed  Post Treatment  Dialyzer Clearance Lightly streaked  Hemodialysis Intake (mL) 0 mL  Liters Processed 84  Fluid Removed (mL) 3000 mL  Tolerated HD Treatment Yes  AVG/AVF Arterial Site Held (minutes) 5 minutes  AVG/AVF Venous Site Held (minutes) 5 minutes   Received patient in bed to unit.  Alert and oriented.  Informed consent signed and in chart.   TX duration: 3.5hrs  Patient tolerated well.  Transported back to the room  Alert, without acute distress.  Hand-off given to patient's nurse.   Access used: RAVF Access issues: none  Total UF removed: 3L Medication(s) given: none    [facility]
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
## 2023-07-09 NOTE — ED Notes (Signed)
 Pt returned from Dialysis.

## 2023-07-09 NOTE — Plan of Care (Signed)
 Per outpatient nephrologist Dr. Lindon Romp Elizarraraz has been discharged from [facility] practice due to safety concerns. Will need alternative arrangements for outpatient dialysis.

## 2023-07-09 NOTE — ED Notes (Signed)
 Pt remains asleep , restraints off at this time

## 2023-07-09 NOTE — Consult Note (Addendum)
 Renal Service Consult Note [facility]  Paul [name] 07/09/2023 Paul Krabbe, MD Requesting Physician: Dr. Fredderick [name]  Reason for Consult: ESRD pt w/  HPI: The patient is [name] 58 y.o. year-old w/ PMH as below who presented to ED brought in by [employer] under IVC. IVC paperwork stated pt has schizophrenia and is not taking his medications. In ED was seen by psychiatry. Labs show bun 107, creat 14.9, K+ 4.1, CO2 20.  WBC 10, Hb 9.3.  We are asked to see for dialysis.    Pt seen in ED. Security are outside the room and pt is in wrist restraints. Just got Geodon. He is sleepy, and pleasant. Denies any CP or SOB.    PMH Bipolar disorder CHF ESRD on HD CHF OA Schizophrenia  H/o severe HTN   ROS - denies CP, no joint pain, no HA, no blurry vision, no rash, no diarrhea, no nausea/ vomiting, no dysuria, no difficulty voiding   Past Medical History  Past Medical History:  Diagnosis Date   Bipolar disorder (HCC)    Central retinal artery occlusion    with macular infarction of the right eye. status post posterior vitrectomy and photocoagulation with insertion of [name] shunt in 2006.   CHF (congestive heart failure) (HCC)    chronic mixed syst/diast. 02/2009 echo: Systolic function was mildly reduced. The estimated ejection fraction was in 45%, global HK, Grade I Diast dysfxn.   Chronic kidney disease    ESRD   Heart failure    Obesity    Osteoarthritis    s/p right hip replacement in 2001   Schizophrenia (HCC)    Severe uncontrolled hypertension    Past Surgical History  Past Surgical History:  Procedure Laterality Date   AV FISTULA PLACEMENT Right 07/08/2022   Procedure: RIGHT RADIOCEPHALIC ARTERIOVENOUS (AV) FISTULA CREATION;  Surgeon: Paul Sparrow, MD;  Location: [facility] OR;  Service: Vascular;  Laterality: Right;   IR FLUORO GUIDE CV LINE RIGHT  05/13/2022   IR US GUIDE VASC ACCESS RIGHT  05/13/2022   JOINT REPLACEMENT     right hip replacement   right eye surgery      for glaucoma   TOTAL HIP ARTHROPLASTY Bilateral 1997, 2001   Family History  Family History  Problem Relation Age of Onset   Hypertension Father    Stroke Father    Social History  reports that he has quit smoking. He has never used smokeless tobacco. He reports that he does not drink alcohol and does not use drugs. Allergies  Allergies  Allergen Reactions   Penicillin G    Home medications Prior to Admission medications   Medication Sig Start Date End Date Taking? Authorizing Provider  isosorbide mononitrate (IMDUR) 30 MG 24 hr tablet Take 1 tablet (30 mg total) by mouth daily. 05/16/23  Yes   rosuvastatin (CRESTOR) 5 MG tablet Take 1 tablet (5 mg total) by mouth daily. 03/24/23  Yes   sucroferric oxyhydroxide (VELPHORO) 500 MG chewable tablet Chew 2 tablets (1,000 mg total) by mouth with breakfast, with lunch, and with evening meal. 08/08/22  Yes   carvedilol (COREG) 6.25 MG tablet Take 1 tablet (6.25 mg total) by mouth 2 (two) times daily with [name] meal. Patient not taking: Reported on 07/08/2023 06/15/22 06/22/23  Paul Emms, MD  isosorbide mononitrate (IMDUR) 60 MG 24 hr tablet Take 0.5 tablets (30 mg total) by mouth daily. Patient not taking: Reported on 07/08/2023 05/09/23   Paul Matar, MD  ketoconazole (NIZORAL) 2 % cream Apply 1 Application topically daily. Patient not taking: Reported on 07/08/2023 07/08/23   Paul [name], DPM  lidocaine-prilocaine (EMLA) cream Apply 1 [name] small amount to skin three times [name] week apply to AVF 30-60 min prior to dialysis, wrap site w plastic wrap Patient not taking: Reported on 07/08/2023 09/12/22     polyethylene glycol powder (GLYCOLAX/MIRALAX) 17 GM/SCOOP powder Take 17 g by mouth daily. Patient not taking: Reported on 07/08/2023 01/29/23   Paul Barefoot A, MD  rosuvastatin (CRESTOR) 5 MG tablet Take 1 tablet by mouth once [name] day Patient not taking: Reported on 07/08/2023 11/21/22     sucroferric oxyhydroxide (VELPHORO) 500 MG chewable tablet Chew 3  tablets (1,500 mg total) by mouth 3 (three) times daily with meals. Patient not taking: Reported on 07/08/2023 04/02/23        Vitals:   07/08/23 2027  BP: (!) 163/93  Pulse: 99  Resp: 18  Temp: 98.1 F (36.7 C)  SpO2: 99%   Exam Gen alert, no distress No rash, cyanosis or gangrene Sclera anicteric, throat clear  No jvd or bruits Chest clear bilat to bases, no rales/ wheezing RRR no MRG Abd soft ntnd no mass or ascites +bs GU nl male MS no joint effusions or deformity Ext trace LE or UE edema, no other edema Neuro is alert, Ox 3 , nf    RUA AVF+bruit       Renal-related home meds: - coreg 6.25 bid  - imdur 30 - velphoro 1000mg  ac tid     OP HD: G-O MWF 4h  B400  76.5kg  2K bath AVF  Heparin 2400 - last OP HD 2/28, post wt 76.8kg, getting to dry wt - rocaltrol 0.25 mcg po three times per week - mircera 30 mcg IV q 4 wks, last 2/05, due 3/05 today   Assessment/ Plan: Homicidal behavior - h/o bipolar disorder, see ED notes per psychiatry. Boarding in ED for now. Under IVC.  ESRD - on HD MWF. Last HD was last Friday. B/Cr high, K+ ok. Needs dialysis but not urgently. No sig vol overload, BUN 100s due to missed HD x 1. Assuming he is cooperative we will try HD today upstairs.  HTN - BP's slightly high, cont any home meds.  Volume - slight vol ^ on exam, but lungs clear and CXR clear Anemia of esrd - Hb 9.3, due for esa today will order darbe 30 mcg weekly sq while here.  Secondary hyperparathyroidism - CCa in range, add phos, cont binders w/ meals.     Paul Moselle  MD [facility] 07/09/2023, 11:12 AM  Recent Labs  Lab 07/08/23 2037  HGB 9.3*  ALBUMIN 3.6  CALCIUM 8.4*  CREATININE 14.91*  K 4.1   Inpatient medications:  isosorbide mononitrate  30 mg Oral Daily   OLANZapine zydis  10 mg Oral QHS   rosuvastatin  5 mg Oral Daily   sucroferric oxyhydroxide  1,000 mg Oral TID with meals    acetaminophen, benztropine, OLANZapine, OLANZapine zydis, ziprasidone '

## 2023-07-10 DIAGNOSIS — F250 Schizoaffective disorder, bipolar type: Secondary | ICD-10-CM | POA: Diagnosis not present

## 2023-07-10 DIAGNOSIS — R45850 Homicidal ideations: Secondary | ICD-10-CM | POA: Diagnosis not present

## 2023-07-10 LAB — HEPATITIS B SURFACE ANTIBODY, QUANTITATIVE: Hep B S AB Quant (Post): 9018 m[IU]/mL

## 2023-07-10 MED ORDER — CHLORHEXIDINE GLUCONATE CLOTH 2 % EX PADS: 6.0000 | MEDICATED_PAD | Freq: Every day | CUTANEOUS | Status: DC
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
## 2023-07-10 NOTE — Progress Notes (Signed)
 [facility] Progress Note  Subjective:    Vitals:   07/09/23 1810 07/09/23 1815 07/09/23 1817 07/10/23 1039  BP: (!) 133/97  139/84 (!) 143/94  Pulse: 92 73 86 96  Resp: 14 14 14 17   Temp: 97.8 F (36.6 C)  97.8 F (36.6 C) 97.7 F (36.5 C)  TempSrc: Axillary   Oral  SpO2: 100%  100% 100%  Weight:        Exam: Gen alert, no distress No rash, cyanosis or gangrene Sclera anicteric, throat clear  No jvd or bruits Chest clear bilat to bases, no rales/ wheezing RRR no MRG Abd soft ntnd no mass or ascites +bs GU nl male MS no joint effusions or deformity Ext trace LE or UE edema, no other edema Neuro is alert, Ox 3 , nf    RUA AVF+bruit     Renal-related home meds: - coreg 6.25 bid  - imdur 30 - velphoro 1000mg  ac tid     OP HD: G-O MWF 4h  B400  76.5kg  2K bath AVF  Heparin 2400 - last OP HD 2/28, post wt 76.8kg, getting to dry wt - rocaltrol 0.25 mcg po three times per week - mircera 30 mcg IV q 4 wks, last 2/05, due 3/05 today     Assessment/ Plan: Homicidal behavior - h/o bipolar disorder, see ED notes per psychiatry. Boarding in ED for now. Under IVC. Per ED/ psychiatry.  ESRD - on HD MWF. Last HD was last Friday. Had HD here yesterday. Next HD Friday. Unfortunately due to patient's behavior he has been released from [facility]. Will need to come to ED for hospital dialysis until/ unless he can find another HD group that will take him.  HTN - BP's good, cont home meds.  Volume - no vol excess, CXR clear on RA. 2kg up pre HD yesterday. Euvolemic today. UF 1-2 L w/ HD tomorrow.  Anemia of esrd - Hb 9.3, due for esa today will order darbe 30 mcg weekly sq while here.  Secondary hyperparathyroidism - CCa in range, add phos, cont binders w/ meals.     Vinson Moselle MD  [facility] 07/10/2023, 1:43 PM  Recent Labs  Lab 07/08/23 2037 07/09/23 1435  HGB 9.3* 8.2*  ALBUMIN 3.6 3.1*  CALCIUM 8.4* 7.9*  PHOS  --  9.5*  CREATININE 14.91* 16.05*  K 4.1 4.5   No  results for input(s): "IRON", "TIBC", "FERRITIN" in the last 168 hours. Inpatient medications:  Chlorhexidine Gluconate Cloth  6 each Topical Q0600   isosorbide mononitrate  30 mg Oral Daily   OLANZapine zydis  10 mg Oral QHS   rosuvastatin  5 mg Oral Daily   sucroferric oxyhydroxide  1,000 mg Oral TID with meals    acetaminophen, benztropine, OLANZapine, OLANZapine zydis, ziprasidone

## 2023-07-10 NOTE — Progress Notes (Signed)
 LCSW Progress Note  [id number]   Paul [name]  07/10/2023  2:41 PM  Description:   Inpatient Psychiatric Referral  Patient was recommended inpatient per Arsenio Loader NP. There are no available beds at [facility], per [facility] [name] RN Patient was referred to the following out of network facilities:    Destination  Service Provider Address Phone Fax  [facility] [address], [place] [address] [phone]  [facility]-Ad[facility] [address] [place], [place] [address] [phone]  [facility] [address] [place], [place] [address] [phone]  [facility] [address] [place] [address] [phone]  [facility] [address] [place] [address] [phone]  [facility] [address], [place] [address] [phone]  [facility] [address], [place] [address] [phone]  [facility] EFAX [address] [place], [place] [phone]  [facility] [address], [place] [address] [phone]  [facility] [address] [place], [place] [address] [phone]  [facility] [address]        Situation ongoing, CSW to continue following and update chart as more information becomes available.      [name], MSW, LCSW  07/10/2023 2:41 PM

## 2023-07-10 NOTE — Progress Notes (Signed)
 Patient has been denied by [facility] due to no appropriate beds available. Patient meets [facility] inpatient criteria per Arsenio Loader, NP. Patient has been faxed out to the following facilities:   [facility] [address] [place] [address] [phone]  [facility] Center-Adult [address] [place], [place] [address] [phone]  [facility] Center-Geriatric [address] [place], [place] [address] [phone]  [facility] [address] [place] [address] [phone]  [facility] [address] [place] [address] [phone]  [facility] [address], [place] [address] [phone]  [facility] [address] [place] [address] [phone]  [facility] EFAX [address] [place], [place] [phone]  [facility] [address] [place], [place] [address] [phone]  [facility] [address] [place], [place] [address] [phone]  [facility] [address], [place] [address] [phone]    Damita Dunnings, MSW, LCSW-A  6:43 PM 07/10/2023

## 2023-07-10 NOTE — Progress Notes (Signed)
 Patient in ER.  Paul [name]  [facility]  [facility] Guide  Direct Dial: [phone]  Fax [phone]

## 2023-07-10 NOTE — Consult Note (Addendum)
 [facility] Psychiatric Consult Follow-up  Patient Name: .Paul [name]  MRN: [id number]  DOB: 09-14-1965  Consult Order details:  Orders (From admission, onward)     Start     Ordered   07/08/23 2026  CONSULT TO CALL ACT TEAM       Ordering Provider: [name], PA-C  Provider:  (Not yet assigned)  Question:  Reason for Consult?  Answer:  Psych consult   07/08/23 2025             Mode of Visit: In person    Psychiatry Consult Evaluation  Service Date: July 10, 2023 LOS:  LOS: 0 days  Chief Complaint: "God downloaded a message to the holy ghost on January 20th"  Primary Psychiatric Diagnoses  Schizoaffective disorder, bipolar type (HCC)  Assessment   Per. Dr. Gilman Schmidt, MD  Paul [name] is a 58 year old male with a history of schizophrenia, bipolar disorder, CKD, CHF, hypertension who presents to the ED on an IVC after threatening to cut the heads off of women who work in the facility. Per chart review, patient with a history of violence. Patient also reportedly posting YouTube videos "He was calling for men to pick up Swords, kill women and feminists and anybody who supports trans rights, 'chop them up and feed them to the birds.'  Patient also has been stating that he has a calling from God to lead this nation to greatness and that he is the only person who can do it." Per chart review, has been having angry outbursts in the ED and also threatened to "put a syringe in the eye of the first psychiatrist who talks to him." Psychiatry consulted for medication management.   Upon reevaluation, patient continues to present with delusional themes of religiosity and grandiosity, vague expressions of homicidal ideations to harm others, with poor insight/judgment, thus the recommendation remains for inpatient mental health hospitalization at this time.  07/10/2023  Upon reevaluation, patient presents with much calmer and less atypical interpersonal style, mild to moderate  distancing of himself from delusional themes of grandiosity and religiosity, with less overt/intense expressions of delusional themes aforementioned, as well as today denies current thoughts and rejects previously reported endorsements/desires of homicidal ideations towards others, and has been selectively compliant with oral medications; however, given the patient continues to have limited insight, reports from initial events that led to this encounter, express concerning delusional themes of religiosity and grandiosity, and present with meaningful periods of aggression and agitation, requiring increased staff intervention (see notes), recommendation remains for inpatient mental health hospitalization at this time, for safety and stabilization of the patient.  Diagnoses:  Active Hospital problems: Principal Problem:   Schizoaffective disorder, bipolar type (HCC)    Plan   ## Psychiatric Medication Recommendations:   -Recommend continue olanzapine 10 mg p.o. nightly  ## Medical Decision Making Capacity: Not specifically addressed in this encounter  ## Further Work-up: UDS/UA  ## Disposition:--We recommend inpatient mental health hospitalization under involuntary commitment  ## Behavioral / Environmental: -Agitation/safety precautions    ## Safety and Observation Level:  - Based on my clinical evaluation, I estimate the patient to be at low risk of self harm in the current setting. - At this time, we recommend  1:1 Observation. This decision is based on my review of the chart including patient's history and current presentation, interview of the patient, mental status examination, and consideration of suicide risk including evaluating suicidal ideation, plan, intent, suicidal or self-harm behaviors, risk factors, and  protective factors. This judgment is based on our ability to directly address suicide risk, implement suicide prevention strategies, and develop a safety plan while the patient  is in the clinical setting. Please contact our team if there is a concern that risk level has changed.  CSSR Risk Category:C-SSRS RISK CATEGORY: No Risk  Suicide Risk Assessment: Patient has following modifiable risk factors for suicide: social isolation, recklessness, and medication noncompliance, access to a sword, which we are addressing by treatment recommendations. Patient has following non-modifiable or demographic risk factors for suicide: male gender and psychiatric hospitalization Patient has the following protective factors against suicide: Access to outpatient mental health care, Cultural, spiritual, or religious beliefs that discourage suicide, no history of suicide attempts, and no history of NSSIB  Thank you for this consult request. Recommendations have been communicated to the primary team.  We will continue to follow at this time.   Lenox Ponds, NP     History of Present Illness   Per. Dr. Gilman Schmidt, MD  Paul [name] is a 58 year old male with a history of schizophrenia, bipolar disorder, CKD, CHF, hypertension who presents to the ED on an IVC after threatening to cut the heads off of women who work in the facility. Per chart review, patient with a history of violence. Patient also reportedly posting YouTube videos "He was calling for men to pick up Swords, kill women and feminists and anybody who supports trans rights, 'chop them up and feed them to the birds.'  Patient also has been stating that he has a calling from God to lead this nation to greatness and that he is the only person who can do it." Per chart review, has been having angry outbursts in the ED and also threatened to "put a syringe in the eye of the first psychiatrist who talks to him." Psychiatry consulted for medication management.   Patient seen [facility] at [facility] emergency department for face-to-face psychiatric reevaluation.  Upon reevaluation, patient endorses that, "this is all a  misunderstanding", states that he does not need to be here in the hospital.   Expanding on this, patient endorses that he has been hearing from the [place] for approximately 14 years now, but recently on May 26, 2023, when Trump was at the inauguration to accept the presidency, states that, "the [employer] for me, to the holy ghost inside of me, to spread to the world, and get people ready for the great day of judgment and slaughter that is to come", and that because of telling others this, states that, "a man in a green shirt had me involuntarily committed and brought here, but this whole thing is again, a misunderstanding".   Patient asked to expand on what he means by, "great day of judgment and slaughter", states again that the reports are all a, "misunderstanding", and that he does not need to be here, then begins telling this Clinical research associate about, "revelations" that he has received like, "Moses and Jeri [place]", and begins to discuss vaguely plans he has received from, "the [place]" about men needing to buy swords, how feminist will "surely die" in the "slaughter to come", and I, "made a bunch of videos that I edited, directed, and wrote out in a three part series that I put on YouTube, but that is a really long story... basically god has told me that other people are not going to understand, but that I need to try and relay a message of the great day that  is to come, to try to get people to repent nationally, because I am the only person that can do it".  Patient asked directly about homicidal ideations, and previous statements made, to which the patient at this time vaguely expresses homicidal ideations indirectly towards, "well, look, when judgment comes, the Lord's not playing games, slaughter will happen, I have my part in the great day to come".  Patient endorses no current auditory or visual hallucinations, denies hearing reports from the [place] during evaluation, and additionally states, "I have  not had a message downloaded since 22nd January".  Objectively, patient does not appear to be responding to internal and/or external stimuli.  Patient endorses no suicidal ideations.  Patient orientation is intact, no concerns for fluctuations in consciousness.  Patient endorses a frustrated mood, with an oddly constricted to neutral affect, disheveled presentation, and a atypical interpersonal style.  Patient endorses he has been eating and sleeping normally.  Patient endorses good toleration of medications, no appreciable side effects.  Per chart review/nursing: Patient earlier today had an incident of destroying a nursing station computer, which prompted multiple law enforcement to be needed to restrain the patient for severe agitation/psychosis so emergent medications could be given, and ultimately the patient needing to be placed in 4 point restraints for safety.  Patient since severe incident earlier has been showing improvements, has been amenable to oral medications prescribed, has been more directable by staff, has been eating, and has been able to sleep.  07/10/2023  Patient seen today at the [facility] emergency department for face-to-face psychiatric reevaluation.  Upon reevaluation, patient immediately asserts that his mood is frustrated that he is still in the emergency department awaiting inpatient mental health hospital placement, states that he feels that this is not needed, and that, "I feel like things were just misunderstood, I am not going to kill anyone, I do not want to hurt anyone, I would not do that, I've just been trying to tell everyone that we need a national revival, the lord demands it, otherwise we're all just going to perish". Patient during this time is notably calm to mildly frustrated, but remains with an atypical interpersonal style, variable eye contact, and an oddly constricted to neutral affect.  Patient expands on this, states that it is true, that he recently  bought a sword because of, "messages from the [employer] [employer] being downloaded to me on May 27, 2022 during the inauguration", but that, "I am not crazy, I just received messages from the [place], I do not plan to hurt anyone, but God is going to when he comes back".  Patient endorses formally when asked, he is not experiencing any homicidal and suicidal ideations, he continues to not receive any further messages from the [place], denies auditory and or visual hallucinations, and objectively, does not appear to be responding to internal and/or external stimuli.  Patient orientation is intact, no concerns for fluctuations in consciousness.  Patient endorses that he feels like he slept good over the night and continues to eat normally.  Patient endorses that he believes that he has been compliant with medications, but when confronted about scheduled olanzapine that has been reported to be refused, patient apologizes for this at this time, states that he has no objection to taking oral medications, states that, "I have been, tell the nurse I will take my medication right now".  Patient endorsed good toleration of medications, no appreciable side effects, and upon physical exam, no appreciable evidence of dystonia and/or other  EPS symptomology, such as akathisia.  Discussed with patient that the recommendation remains for inpatient mental health hospitalization, given the recent events that transpired which led to this encounter, the patient reportedly being selectively compliant with medications, evaluations conducted to date, and reports from staff about incidents of increased agitations/aggression.  Patient verbalized understanding, states that he will try to be more calm and cooperative, as well as take his scheduled medications.  Per chart review/nursing: Patient has been selectively compliant with medications.  Patient has not had any severe incidents of aggression and/or agitations towards staff, but has had more  moderate incidents requiring increase staff intervention to redirect the patient.  Patient has been able to sleep and eat.  Review of Systems  Psychiatric/Behavioral:  Negative for depression, hallucinations (States none since "january 22nd, 2024"), substance abuse and suicidal ideas. The patient is not nervous/anxious and does not have insomnia.   All other systems reviewed and are negative.    Psychiatric and Social History  Psychiatric History:  Information collected from chart review/patient/IVC paperwork  Prev Dx/Sx: Schizophrenia, schizoaffective disorder, bipolar Current Psych Provider: Unknown Home Meds (current): Unknown Previous Med Trials: Haldol, Paxil Therapy: None endorsed  Prior Psych Hospitalization: Yes, per patient Prior Self Harm: Unknown Prior Violence: Yes, per chart review, history of violence, history of endorsing needing to kill others  Family Psych History: Unknown Family Hx suicide: Unknown  Social History:  Developmental Hx: Unknown Educational Hx: Unknown Occupational Hx: Unknown Legal Hx: Involuntary commitment Living Situation: Lives in a halfway house Spiritual Hx: Christian  Access to weapons/lethal means: Reportedly has a sword recently purchased; patient today affirms he has a sword that he bought recently due to being told by, "the Lord"  Substance History  Alcohol: None endorsed or reported Type of alcohol : None endorsed or reported Last Drink : None endorsed or reported Number of drinks per day : None endorsed or reported History of alcohol withdrawal seizures : None endorsed or reported History of DT's : None endorsed or reported Tobacco: : Reportedly smoked 30 years ago, smoked for 1 year Illicit drugs: : None endorsed or reported Prescription drug abuse: : None endorsed or reported Rehab hx: : None endorsed or reported  Exam Findings  Physical Exam: As below Vital Signs:  Temp:  [97.6 F (36.4 C)-97.8 F (36.6 C)] 97.7 F  (36.5 C) (03/06 1039) Pulse Rate:  [71-96] 96 (03/06 1039) Resp:  [13-22] 17 (03/06 1039) BP: (122-167)/(78-108) 143/94 (03/06 1039) SpO2:  [98 %-100 %] 100 % (03/06 1039) Weight:  [78.5 kg] 78.5 kg (03/05 1423) Blood pressure (!) 143/94, pulse 96, temperature 97.7 F (36.5 C), temperature source Oral, resp. rate 17, weight 78.5 kg, SpO2 100%. Body mass index is 30.66 kg/m.  Physical Exam Vitals and nursing note reviewed.  Constitutional:      General: He is not in acute distress.    Appearance: He is obese. He is not ill-appearing, toxic-appearing or diaphoretic.     Comments: Atypical interpersonal style  Pulmonary:     Effort: Pulmonary effort is normal.  Skin:    General: Skin is warm and dry.  Neurological:     Mental Status: He is alert and oriented to person, place, and time.     Motor: No tremor or seizure activity.     Comments: Denies EPS symptomology, and appreciably upon physical exam, does not present with evidence of dystonia and/or akathisia.  Psychiatric:        Attention and Perception: Attention normal. He  is attentive. He does not perceive auditory or visual hallucinations.        Behavior: Behavior is cooperative.        Thought Content: Thought content is delusional (Grandios and religious delusional themes). Thought content is not paranoid. Thought content does not include homicidal or suicidal ideation.        Cognition and Memory: Cognition and memory normal.        Judgment: Judgment is impulsive and inappropriate.     Mental Status Exam: General Appearance: Less disheveled, less bizarre and atypical in interpersonal style  Orientation:  Full (Time, Place, and Person)  Memory:   Variable, largely intact  Concentration:  Concentration: Fair and Attention Span: Fair  Recall:  Fair  Attention  Fair  Eye Contact: Variable to fair  Speech: Clear and coherent, but with disruption and/or irregular flow speech (I.e., frequently with noticeable pauses,  hesitations, or breaks in natural rhythm)  Language:  Fair  Volume:  Normal  Mood: Frustrated  Affect:   Oddly constricted to neutral  Thought Process:  Goal Directed and Linear superficially to circumstantial with improvements in frequency of loosening of associations and/or thought derailments  Thought Content:  Delusions; grandiose and religious (improving)  Suicidal Thoughts:  No  Homicidal Thoughts:  No  Judgement:  Impaired  Insight:  Lacking  Psychomotor Activity:  Normal  Akathisia:  No  Fund of Knowledge:  Fair      Assets:  Health and safety inspector Housing Leisure Time Physical Health Resilience  Cognition:  WNL  ADL's:  Intact  AIMS (if indicated):   0     Other History   These have been pulled in through the EMR, reviewed, and updated if appropriate.  Family History:  The patient's family history includes Hypertension in his father; Stroke in his father.  Medical History: Past Medical History:  Diagnosis Date   Bipolar disorder (HCC)    Central retinal artery occlusion    with macular infarction of the right eye. status post posterior vitrectomy and photocoagulation with insertion of a shunt in 2006.   CHF (congestive heart failure) (HCC)    chronic mixed syst/diast. 02/2009 echo: Systolic function was mildly reduced. The estimated ejection fraction was in 45%, global HK, Grade I Diast dysfxn.   Chronic kidney disease    ESRD   Heart failure    Obesity    Osteoarthritis    s/p right hip replacement in 2001   Schizophrenia (HCC)    Severe uncontrolled hypertension     Surgical History: Past Surgical History:  Procedure Laterality Date   AV FISTULA PLACEMENT Right 07/08/2022   Procedure: RIGHT RADIOCEPHALIC ARTERIOVENOUS (AV) FISTULA CREATION;  Surgeon: Victorino Sparrow, MD;  Location: [facility] OR;  Service: Vascular;  Laterality: Right;   IR FLUORO GUIDE CV LINE RIGHT  05/13/2022   IR US GUIDE VASC ACCESS RIGHT  05/13/2022   JOINT REPLACEMENT     right hip  replacement   right eye surgery     for glaucoma   TOTAL HIP ARTHROPLASTY Bilateral 1997, 2001     Medications:   Current Facility-Administered Medications:    acetaminophen (TYLENOL) tablet 650 mg, 650 mg, Oral, Q6H PRN, Arabella Merles, PA-C   benztropine (COGENTIN) tablet 1 mg, 1 mg, Oral, BID PRN, Adria Dill, MD   Chlorhexidine Gluconate Cloth 2 % PADS 6 each, 6 each, Topical, Q0600, Delano Metz, MD   isosorbide mononitrate (IMDUR) 24 hr tablet 30 mg, 30 mg, Oral, Daily, Arabella Merles, [place],  30 mg at 07/10/23 1034   OLANZapine (ZYPREXA) injection 10 mg, 10 mg, Intramuscular, Once PRN, Gilman Schmidt, Cloretta Ned, MD   OLANZapine zydis (ZYPREXA) disintegrating tablet 10 mg, 10 mg, Oral, QHS, Feller, Sophie C, MD   OLANZapine zydis (ZYPREXA) disintegrating tablet 10 mg, 10 mg, Oral, BID PRN, Adria Dill, MD, 10 mg at 07/10/23 1110   rosuvastatin (CRESTOR) tablet 5 mg, 5 mg, Oral, Daily, Truddie Hidden, Amanda, PA-C, 5 mg at 07/10/23 1034   sucroferric oxyhydroxide (VELPHORO) chewable tablet 1,000 mg, 1,000 mg, Oral, TID with meals, Truddie Hidden, Amanda, PA-C   ziprasidone (GEODON) injection 20 mg, 20 mg, Intramuscular, Once PRN, [name], PA-C  Current Outpatient Medications:    isosorbide mononitrate (IMDUR) 30 MG 24 hr tablet, Take 1 tablet (30 mg total) by mouth daily., Disp: 90 tablet, Rfl: 3   rosuvastatin (CRESTOR) 5 MG tablet, Take 1 tablet (5 mg total) by mouth daily., Disp: 90 tablet, Rfl: 3   sucroferric oxyhydroxide (VELPHORO) 500 MG chewable tablet, Chew 2 tablets (1,000 mg total) by mouth with breakfast, with lunch, and with evening meal., Disp: 180 tablet, Rfl: 11   carvedilol (COREG) 6.25 MG tablet, Take 1 tablet (6.25 mg total) by mouth 2 (two) times daily with a meal. (Patient not taking: Reported on 07/08/2023), Disp: 60 tablet, Rfl: 12   isosorbide mononitrate (IMDUR) 60 MG 24 hr tablet, Take 0.5 tablets (30 mg total) by mouth daily. (Patient not taking: Reported  on 07/08/2023), Disp: 15 tablet, Rfl: 0   ketoconazole (NIZORAL) 2 % cream, Apply 1 Application topically daily. (Patient not taking: Reported on 07/08/2023), Disp: 60 g, Rfl: 2   lidocaine-prilocaine (EMLA) cream, Apply 1 a small amount to skin three times a week apply to AVF 30-60 min prior to dialysis, wrap site w plastic wrap (Patient not taking: Reported on 07/08/2023), Disp: 30 g, Rfl: 11   polyethylene glycol powder (GLYCOLAX/MIRALAX) 17 GM/SCOOP powder, Take 17 g by mouth daily. (Patient not taking: Reported on 07/08/2023), Disp: 238 g, Rfl: 0   rosuvastatin (CRESTOR) 5 MG tablet, Take 1 tablet by mouth once a day (Patient not taking: Reported on 07/08/2023), Disp: 30 tablet, Rfl: 11   sucroferric oxyhydroxide (VELPHORO) 500 MG chewable tablet, Chew 3 tablets (1,500 mg total) by mouth 3 (three) times daily with meals. (Patient not taking: Reported on 07/08/2023), Disp: 810 tablet, Rfl: 3  Allergies: Allergies  Allergen Reactions   Penicillin Howard Pouch, NP

## 2023-07-10 NOTE — ED Notes (Signed)
 Pt door of room multiple times asking to see a psychiatrist.  Pt informed that psychiatry will be making rounds this morning and that he will be able to speak with them at that time.  Pt states he has been "kidnapped" and that he "didn't do anything to get here in jail."  Pt asked for a phone to "call the police, this is illegal".  Pt informed that he is in hospital and does not need to call police.  Odd duty [employer] to unit to talk with pt.  Pt continues to ask for psychiatry.  Pt is loud and aggressive in speech at this time.

## 2023-07-10 NOTE — ED Provider Notes (Addendum)
 Emergency Medicine Observation Re-evaluation Note  Paul [name] is a 58 y.o. male, seen on rounds today.  Pt initially presented to the ED for complaints of Medical Clearance (IVC) Currently, the patient is resting in NAD.  Physical Exam  BP 139/84   Pulse 86   Temp 97.8 F (36.6 C)   Resp 14   Wt 78.5 kg   SpO2 100%   BMI 30.66 kg/m  Physical Exam General: Appears to be resting comfortably in bed, no acute distress. Cardiac: Regular rate, normal heart rate, non-emergent blood pressure for this morning's vitals. Lungs: No increased work of breathing.  Equal chest rise appreciated Psych: Calm, asleep in bed.   ED Course / MDM  EKG:EKG Interpretation Date/Time:  Tuesday July 08 2023 21:01:24 EST Ventricular Rate:  99 PR Interval:  178 QRS Duration:  90 QT Interval:  378 QTC Calculation: 485 R Axis:   9  Text Interpretation: Normal sinus rhythm Septal infarct , age undetermined Abnormal ECG When compared with ECG of 13-Jun-2023 17:33, PREVIOUS ECG IS PRESENT Confirmed by Zadie Rhine ([id number]) on 07/09/2023 7:08:41 AM  I have reviewed the labs performed to date as well as medications administered while in observation.  Recent changes in the last 24 hours include NA.  Plan  Current plan is for Arsenio Loader, NP, recommends inpatient psychiatric hospitalization   Glyn Ade, MD 07/10/23 3086    Glyn Ade, MD 07/10/23 [phone]

## 2023-07-11 DIAGNOSIS — R45850 Homicidal ideations: Secondary | ICD-10-CM | POA: Diagnosis not present

## 2023-07-11 DIAGNOSIS — F250 Schizoaffective disorder, bipolar type: Secondary | ICD-10-CM | POA: Diagnosis not present

## 2023-07-11 LAB — RENAL FUNCTION PANEL
Albumin: 3.2 g/dL — ABNORMAL LOW (ref 3.5–5.0)
Anion gap: 14 (ref 5–15)
BUN: 84 mg/dL — ABNORMAL HIGH (ref 6–20)
CO2: 23 mmol/L (ref 22–32)
Calcium: 7.5 mg/dL — ABNORMAL LOW (ref 8.9–10.3)
Chloride: 97 mmol/L — ABNORMAL LOW (ref 98–111)
Creatinine, Ser: 12.77 mg/dL — ABNORMAL HIGH (ref 0.61–1.24)
GFR, Estimated: 4 mL/min — ABNORMAL LOW (ref >60)
Glucose, Bld: 89 mg/dL (ref 70–99)
Phosphorus: 10.1 mg/dL — ABNORMAL HIGH (ref 2.5–4.6)
Potassium: 4.7 mmol/L (ref 3.5–5.1)
Sodium: 134 mmol/L — ABNORMAL LOW (ref 135–145)

## 2023-07-11 LAB — CBC
HCT: 26.2 % — ABNORMAL LOW (ref 39.0–52.0)
Hemoglobin: 9.1 g/dL — ABNORMAL LOW (ref 13.0–17.0)
MCH: 34.3 pg — ABNORMAL HIGH (ref 26.0–34.0)
MCHC: 34.7 g/dL (ref 30.0–36.0)
MCV: 98.9 fL (ref 80.0–100.0)
Platelets: 209 10*3/uL (ref 150–400)
RBC: 2.65 MIL/uL — ABNORMAL LOW (ref 4.22–5.81)
RDW: 15.1 % (ref 11.5–15.5)
WBC: 7.2 10*3/uL (ref 4.0–10.5)
nRBC: 0 % (ref 0.0–0.2)

## 2023-07-11 MED ORDER — NEPRO/CARBSTEADY PO LIQD: 237.0000 mL | ORAL | Status: DC | PRN

## 2023-07-11 MED ORDER — HEPARIN SODIUM (PORCINE) 1000 UNIT/ML DIALYSIS: 2500.0000 [IU] | Freq: Once | INTRAMUSCULAR | Status: DC

## 2023-07-11 MED ORDER — ANTICOAGULANT SODIUM CITRATE 4% (200MG/5ML) IV SOLN: 5.0000 mL | Status: DC | PRN

## 2023-07-11 MED ORDER — HEPARIN SODIUM (PORCINE) 1000 UNIT/ML IJ SOLN
INTRAMUSCULAR | Status: AC
  Filled 2023-07-11: qty 3

## 2023-07-11 MED ORDER — LIDOCAINE-PRILOCAINE 2.5-2.5 % EX CREA: 1.0000 | TOPICAL_CREAM | CUTANEOUS | Status: DC | PRN

## 2023-07-11 MED ORDER — HEPARIN SODIUM (PORCINE) 1000 UNIT/ML DIALYSIS
2500.0000 [IU] | Freq: Once | INTRAMUSCULAR | Status: AC
  Administered 2023-07-11: 2500 [IU] via INTRAVENOUS_CENTRAL
  Filled 2023-07-11: qty 3

## 2023-07-11 MED ORDER — LIDOCAINE HCL (PF) 1 % IJ SOLN: 5.0000 mL | INTRAMUSCULAR | Status: DC | PRN

## 2023-07-11 MED ORDER — PENTAFLUOROPROP-TETRAFLUOROETH EX AERO: 1.0000 | INHALATION_SPRAY | CUTANEOUS | Status: DC | PRN

## 2023-07-11 MED ORDER — HEPARIN SODIUM (PORCINE) 1000 UNIT/ML DIALYSIS: 1000.0000 [IU] | INTRAMUSCULAR | Status: DC | PRN

## 2023-07-11 MED ORDER — ALTEPLASE 2 MG IJ SOLR: 2.0000 mg | Freq: Once | INTRAMUSCULAR | Status: DC | PRN

## 2023-07-11 MED ORDER — HEPARIN SODIUM (PORCINE) 1000 UNIT/ML DIALYSIS
1000.0000 [IU] | INTRAMUSCULAR | Status: AC | PRN
  Administered 2023-07-11: 1000 [IU] via INTRAVENOUS_CENTRAL
  Filled 2023-07-11: qty 1

## 2023-07-11 NOTE — Progress Notes (Signed)
 Received patient in bed to unit.  Alert and oriented.  Informed consent signed and in chart.   TX duration: 3.5 hours.  Sitter present the whole session.  Patient tolerated well.  Transported back to the room  Alert, without acute distress.  Hand-off given to patient's nurse.   Access used: Right internal jugular Fistula Access issues: none  Total UF removed: 2L Medication(s) given: none   07/11/23 1130  Vitals  Temp 97.6 F (36.4 C)  Temp Source Oral  BP 127/78  BP Location Left Arm  BP Method Automatic  Patient Position (if appropriate) Lying  Pulse Rate 89  Pulse Rate Source Monitor  Resp 15  Oxygen Therapy  SpO2 100 %  O2 Device Room Air  Patient Activity (if Appropriate) In bed  Pulse Oximetry Type Continuous  During Treatment Monitoring  Duration of HD Treatment -hour(s) 3.5 hour(s)  HD Safety Checks Performed Yes  Intra-Hemodialysis Comments Tx completed  Dialysis Fluid Bolus Normal Saline  Bolus Amount (mL) 300 mL  Post Treatment  Dialyzer Clearance Clear  Liters Processed 84  Fluid Removed (mL) 2000 mL  AVG/AVF Arterial Site Held (minutes) 7 minutes  AVG/AVF Venous Site Held (minutes) 7 minutes  Fistula / Graft Right Other (Comment) Arteriovenous fistula  Placement Date/Time: 07/08/22 2440   Placed prior to admission: No  Orientation: Right  Access Location: (c) Other (Comment)  Access Type: Arteriovenous fistula  Status Deaccessed     Stacie Glaze LPN [facility]

## 2023-07-11 NOTE — ED Notes (Signed)
 Pt transported to dialysis at this time with sitter and security in attendance.

## 2023-07-11 NOTE — Progress Notes (Signed)
 Patient has been denied by [facility] due to no appropriate beds available. Patient meets [facility] inpatient criteria per Arsenio Loader, NP. Patient has been faxed out to the following facilities:   [facility] [address] [place] [address] [phone]  [facility] [address] [place], [place] [address] [phone]  [facility] [address] [place], [place] [address] [phone]  [facility] [address] [place] [address] [phone]  [facility] [address], [place] [address] [phone]  [facility] [address], [place] [address] [phone]  [facility] [address] [place] [address] [phone]  [facility] EFAX [address] [place], [place] [phone]  [facility] [address], [place] [address] [phone]  [facility] [address] [place], [place] [address] [phone]  [facility] [address], [place] [address] [phone]   Damita Dunnings, MSW, LCSW-A  8:41 PM 07/11/2023

## 2023-07-11 NOTE — Progress Notes (Signed)
 LCSW Progress Note  [id number]   Paul [name]  07/11/2023  10:47 AM  Description:   Inpatient Psychiatric Referral  Patient was recommended inpatient per Arsenio Loader NP There are no available beds at [facility], per [facility] RN). Patient was referred to the following out of network facilities:    Destination  Service Provider Address Phone Fax  [facility] [address] [place] [address] [phone]  [facility] [address] [place], [place] [address] [phone]  [facility] [address] [place], [place] [address] [phone]  [facility] [address] [place] [address] [phone]  [facility] [address] [place] [address] [phone]  [facility] [address] [place] [address] [phone]  [facility] [address], [place] [address] [phone]  [facility] EFAX [address] [place], [place] [phone]  [facility] [address] [place] [address] [phone]  [facility] [address] [place] [address] [phone]  [facility] [address] [place] [address] [phone]      Situation ongoing, CSW to continue following and update chart as more information becomes available.      [place] Tomma Ehinger, MSW, LCSW  07/11/2023 10:47 AM

## 2023-07-11 NOTE — ED Notes (Signed)
 Report has been given to HD RN Erick; Transport will happen after shift change; Confirmed new sitter at 7am to accompany patient to floor-Monique, EN

## 2023-07-11 NOTE — ED Provider Notes (Signed)
 Emergency Medicine Observation Re-evaluation Note  Paul [name] is a 58 y.o. male, seen on rounds today.  Pt initially presented to the ED for complaints of acute psychosis. Pt also w esrd/hd, and was dialyzed this AM. Pt denies new c/o currently, indicating he feels improved. Denies any thoughts of harm to self or others.   Physical Exam  BP 138/84 Comment: vitals obtained during dialysis  Pulse 86 Comment: vitals obtained during dialysis  Temp 97.6 F (36.4 C) (Oral)   Resp 13 Comment: vitals obtained during dialysis  Wt 71.3 kg   SpO2 97% Comment: vitals obtained during dialysis  BMI 27.84 kg/m  Physical Exam General: awake, alert, conversant.  Cardiac: regular rate.  Lungs: breathing comfortably. Psych: normal mood. Conversant. Pt indicates 'I was misunderstood', indicates he says things but has no desire or intent to harm others. Does not appear to be actively responding to internal stimuli. No hallucinations noted.   ED Course / MDM    I have reviewed the labs performed to date as well as medications administered while in observation.  Recent changes in the last 24 hours include ED obs, reassessment.   Plan  Pt dialyzed this AM. No physical c/o.   Indicates is feeling better from mental health standpoint. Pt requests BH re-eval.  Disposition per [facility] team.       Cathren Laine, MD 07/11/23 1321

## 2023-07-11 NOTE — ED Notes (Signed)
 Patient continues to leave his room and request a provider evaluate him. Patient redirected to return to his room and provider made aware that he would like to see them.

## 2023-07-11 NOTE — Consult Note (Signed)
 [facility] Psychiatric Consult Follow-up  Patient Name: .Paul [name]  MRN: [id number]  DOB: 02/28/1966  Consult Order details:  Orders (From admission, onward)     Start     Ordered   07/08/23 2026  CONSULT TO CALL ACT TEAM       Ordering Provider: [name], PA-C  Provider:  (Not yet assigned)  Question:  Reason for Consult?  Answer:  Psych consult   07/08/23 2025             Mode of Visit: In person    Psychiatry Consult Evaluation  Service Date: July 11, 2023 LOS:  LOS: 0 days  Chief Complaint: "God downloaded a message to the holy ghost on January 20th"  Primary Psychiatric Diagnoses  Schizoaffective disorder, bipolar type (HCC)  Assessment   Per. Dr. Gilman Schmidt, MD  Paul [name] is a 58 year old male with a history of schizophrenia, bipolar disorder, CKD, CHF, hypertension who presents to the ED on an IVC after threatening to cut the heads off of women who work in the facility. Per chart review, patient with a history of violence. Patient also reportedly posting YouTube videos "He was calling for men to pick up Swords, kill women and feminists and anybody who supports trans rights, 'chop them up and feed them to the birds.'  Patient also has been stating that he has a calling from God to lead this nation to greatness and that he is the only person who can do it." Per chart review, has been having angry outbursts in the ED and also threatened to "put a syringe in the eye of the first psychiatrist who talks to him." Psychiatry consulted for medication management.   Upon reevaluation, patient continues to present with delusional themes of religiosity and grandiosity, vague expressions of homicidal ideations to harm others, with poor insight/judgment, thus the recommendation remains for inpatient mental health hospitalization at this time.  07/11/2023  Upon reevaluation post dialysis, patient presents with anger and irritability.  He yells at provider "you are all  dangerous and bullies."  He yells about not needing medication and yelling "I'm fine" and "I want out."  Patient required redirection when coming out of his room and raising his voice.  He is angry because "I'm being forced to take medication I don't need."  Patient continues to have limited insight; his behavior today and reports from initial events that led to this encounter supports continued recommendation for inpatient mental health hospitalization at this time, for safety and stabilization of the patient.  Diagnoses:  Active Hospital problems: Principal Problem:   Schizoaffective disorder, bipolar type (HCC)    Plan   ## Psychiatric Medication Recommendations:   -Recommend continue olanzapine 10 mg p.o. nightly  ## Medical Decision Making Capacity: Not specifically addressed in this encounter  ## Further Work-up: UDS/UA  ## Disposition:--We recommend inpatient mental health hospitalization under involuntary commitment  ## Behavioral / Environmental: -Agitation/safety precautions    ## Safety and Observation Level:  - Based on my clinical evaluation, I estimate the patient to be at low risk of self harm in the current setting. - At this time, we recommend  1:1 Observation. This decision is based on my review of the chart including patient's history and current presentation, interview of the patient, mental status examination, and consideration of suicide risk including evaluating suicidal ideation, plan, intent, suicidal or self-harm behaviors, risk factors, and protective factors. This judgment is based on our ability to directly address suicide risk,  implement suicide prevention strategies, and develop a safety plan while the patient is in the clinical setting. Please contact our team if there is a concern that risk level has changed.  CSSR Risk Category:C-SSRS RISK CATEGORY: No Risk  Suicide Risk Assessment: Patient has following modifiable risk factors for suicide: social  isolation, recklessness, and medication noncompliance, access to a sword, which we are addressing by treatment recommendations. Patient has following non-modifiable or demographic risk factors for suicide: male gender and psychiatric hospitalization Patient has the following protective factors against suicide: Access to outpatient mental health care, Cultural, spiritual, or religious beliefs that discourage suicide, no history of suicide attempts, and no history of NSSIB  Thank you for this consult request. Recommendations have been communicated to the primary team.  We will continue to follow at this time.   Thomes Lolling, NP     History of Present Illness   Per. Dr. Gilman Schmidt, MD  Paul [name] is a 58 year old male with a history of schizophrenia, bipolar disorder, CKD, CHF, hypertension who presents to the ED on an IVC after threatening to cut the heads off of women who work in the facility. Per chart review, patient with a history of violence. Patient also reportedly posting YouTube videos "He was calling for men to pick up Swords, kill women and feminists and anybody who supports trans rights, 'chop them up and feed them to the birds.'  Patient also has been stating that he has a calling from God to lead this nation to greatness and that he is the only person who can do it." Per chart review, has been having angry outbursts in the ED and also threatened to "put a syringe in the eye of the first psychiatrist who talks to him." Psychiatry consulted for medication management.   Patient seen today at the [facility] emergency department for face-to-face psychiatric reevaluation.  Upon reevaluation, patient endorses that, "this is all a misunderstanding", states that he does not need to be here in the hospital.   Expanding on this, patient endorses that he has been hearing from the [place] for approximately 14 years now, but recently on May 26, 2023, when Trump was at the inauguration to accept the  presidency, states that, "the [employer] for me, to the holy ghost inside of me, to spread to the world, and get people ready for the great day of judgment and slaughter that is to come", and that because of telling others this, states that, "a man in a green shirt had me involuntarily committed and brought here, but this whole thing is again, a misunderstanding".   Patient asked to expand on what he means by, "great day of judgment and slaughter", states again that the reports are all a, "misunderstanding", and that he does not need to be here, then begins telling this Clinical research associate about, "revelations" that he has received like, "Moses and Jeri [place]ena", and begins to discuss vaguely plans he has received from, "the [place]" about men needing to buy swords, how feminist will "surely die" in the "slaughter to come", and I, "made a bunch of videos that I edited, directed, and wrote out in a three part series that I put on YouTube, but that is a really long story... basically god has told me that other people are not going to understand, but that I need to try and relay a message of the great day that is to come, to try to get people to repent nationally, because I am  the only person that can do it".  Patient asked directly about homicidal ideations, and previous statements made, to which the patient at this time vaguely expresses homicidal ideations indirectly towards, "well, look, when judgment comes, the Lord's not playing games, slaughter will happen, I have my part in the great day to come".  Patient endorses no current auditory or visual hallucinations, denies hearing reports from the [place] during evaluation, and additionally states, "I have not had a message downloaded since 22nd January".  Objectively, patient does not appear to be responding to internal and/or external stimuli.  Patient endorses no suicidal ideations.  Patient orientation is intact, no concerns for fluctuations in  consciousness.  Patient endorses a frustrated mood, with an oddly constricted to neutral affect, disheveled presentation, and a atypical interpersonal style.  Patient endorses he has been eating and sleeping normally.  Patient endorses good toleration of medications, no appreciable side effects.  Per chart review/nursing: Patient earlier today had an incident of destroying a nursing station computer, which prompted multiple law enforcement to be needed to restrain the patient for severe agitation/psychosis so emergent medications could be given, and ultimately the patient needing to be placed in 4 point restraints for safety.  Patient since severe incident earlier has been showing improvements, has been amenable to oral medications prescribed, has been more directable by staff, has been eating, and has been able to sleep.  07/11/2023  Patient seen today at the [facility] emergency department for face-to-face psychiatric reevaluation.  Upon reevaluation post dialysis, patient presents with anger and irritability.  He yells at provider "you are all dangerous and bullies."  He yells about not needing medication and yelling "I'm fine" and "I want out."  Patient required redirection when coming out of his room and raising his voice.  He is angry because "I'm being forced to take medication I don't need."  Patient continues to have limited insight; his behavior today and reports from initial events that led to this encounter supports continued recommendation for inpatient mental health hospitalization at this time, for safety and stabilization of the patient.  During evaluation Jordie Schreur Hippe is initially seated on his bed in no acute distress.  He is alert & oriented x 4, and immediately becomes agitated and verbally aggressive.  His mood is frustrated and aggressive with congruent affect.  He has normal loud speech, and postures aggressively.  Objectively there is no evidence of psychosis or mania; there  is evidence of delusional thinking. Pt does not appear to be responding to internal or external stimuli.  He denies suicidal/self-harm/homicidal ideation, psychosis, and paranoia.    Per chart review/nursing: Patient has been selectively compliant with medications.  Patient has not had any severe incidents of aggression and/or agitations towards staff, but has had more moderate incidents requiring increase staff intervention to redirect the patient.  Patient has been able to sleep and eat.  Review of Systems  Psychiatric/Behavioral:  Negative for depression, hallucinations (States none since "january 22nd, 2024"), substance abuse and suicidal ideas. The patient is not nervous/anxious and does not have insomnia.   All other systems reviewed and are negative.    Psychiatric and Social History  Psychiatric History:  Information collected from chart review/patient/IVC paperwork  Prev Dx/Sx: Schizophrenia, schizoaffective disorder, bipolar Current Psych Provider: Unknown Home Meds (current): Unknown Previous Med Trials: Haldol, Paxil Therapy: None endorsed  Prior Psych Hospitalization: Yes, per patient Prior Self Harm: Unknown Prior Violence: Yes, per chart review, history of violence, history of endorsing  needing to kill others  Family Psych History: Unknown Family Hx suicide: Unknown  Social History:  Developmental Hx: Unknown Educational Hx: Unknown Occupational Hx: Unknown Legal Hx: Involuntary commitment Living Situation: Lives in a halfway house Spiritual Hx: Christian  Access to weapons/lethal means: Reportedly has a sword recently purchased; patient today affirms he has a sword that he bought recently due to being told by, "the Lord"  Substance History  Alcohol: None endorsed or reported Type of alcohol : None endorsed or reported Last Drink : None endorsed or reported Number of drinks per day : None endorsed or reported History of alcohol withdrawal seizures : None  endorsed or reported History of DT's : None endorsed or reported Tobacco: : Reportedly smoked 30 years ago, smoked for 1 year Illicit drugs: : None endorsed or reported Prescription drug abuse: : None endorsed or reported Rehab hx: : None endorsed or reported  Exam Findings  Physical Exam: As below Vital Signs:  Temp:  [97.5 F (36.4 C)-97.6 F (36.4 C)] 97.6 F (36.4 C) (03/07 1130) Pulse Rate:  [74-95] 86 (03/07 1147) Resp:  [11-16] 13 (03/07 1147) BP: (123-151)/(76-106) 138/84 (03/07 1147) SpO2:  [97 %-100 %] 97 % (03/07 1147) Weight:  [71.3 kg-74.3 kg] 71.3 kg (03/07 1135) Blood pressure 138/84, pulse 86, temperature 97.6 F (36.4 C), temperature source Oral, resp. rate 13, weight 71.3 kg, SpO2 97%. Body mass index is 27.84 kg/m.  Physical Exam Vitals and nursing note reviewed.  Constitutional:      General: He is not in acute distress.    Appearance: He is obese. He is not ill-appearing, toxic-appearing or diaphoretic.     Comments: Atypical interpersonal style  Pulmonary:     Effort: Pulmonary effort is normal.  Skin:    General: Skin is warm and dry.  Neurological:     Mental Status: He is alert and oriented to person, place, and time.     Motor: No tremor or seizure activity.  Psychiatric:        Attention and Perception: Attention normal. He is attentive. He does not perceive auditory or visual hallucinations.        Behavior: Behavior is cooperative.        Thought Content: Thought content is delusional (Grandious and religious themes). Thought content is not paranoid. Thought content does not include homicidal or suicidal ideation.        Cognition and Memory: Cognition and memory normal.        Judgment: Judgment is impulsive and inappropriate.     Mental Status Exam: General Appearance: Less disheveled, less bizarre and atypical in interpersonal style  Orientation:  Full (Time, Place, and Person)  Memory:   Variable, largely intact  Concentration:   Concentration: Fair and Attention Span: Fair  Recall:  Fair  Attention  Fair  Eye Contact: Variable to fair  Speech: Clear and coherent, but with disruption and/or irregular flow speech (I.e., frequently with noticeable pauses, hesitations, or breaks in natural rhythm)  Language:  Fair  Volume:  Normal  Mood: Frustrated  Affect:   Oddly constricted to neutral  Thought Process:  Goal Directed and Linear superficially to circumstantial with improvements in frequency of loosening of associations and/or thought derailments  Thought Content:  Delusions; grandiose and religious (improving)  Suicidal Thoughts:  No  Homicidal Thoughts:  No  Judgement:  Impaired  Insight:  Lacking  Psychomotor Activity:  Normal  Akathisia:  No  Fund of Knowledge:  Fair  Assets:  Health and safety inspector Housing Leisure Time Physical Health Resilience  Cognition:  WNL  ADL's:  Intact  AIMS (if indicated):   0     Other History   These have been pulled in through the EMR, reviewed, and updated if appropriate.  Family History:  The patient's family history includes Hypertension in his father; Stroke in his father.  Medical History: Past Medical History:  Diagnosis Date   Bipolar disorder (HCC)    Central retinal artery occlusion    with macular infarction of the right eye. status post posterior vitrectomy and photocoagulation with insertion of a shunt in 2006.   CHF (congestive heart failure) (HCC)    chronic mixed syst/diast. 02/2009 echo: Systolic function was mildly reduced. The estimated ejection fraction was in 45%, global HK, Grade I Diast dysfxn.   Chronic kidney disease    ESRD   Heart failure    Obesity    Osteoarthritis    s/p right hip replacement in 2001   Schizophrenia (HCC)    Severe uncontrolled hypertension     Surgical History: Past Surgical History:  Procedure Laterality Date   AV FISTULA PLACEMENT Right 07/08/2022   Procedure: RIGHT RADIOCEPHALIC ARTERIOVENOUS  (AV) FISTULA CREATION;  Surgeon: Victorino Sparrow, MD;  Location: [facility] OR;  Service: Vascular;  Laterality: Right;   IR FLUORO GUIDE CV LINE RIGHT  05/13/2022   IR US GUIDE VASC ACCESS RIGHT  05/13/2022   JOINT REPLACEMENT     right hip replacement   right eye surgery     for glaucoma   TOTAL HIP ARTHROPLASTY Bilateral 1997, 2001     Medications:   Current Facility-Administered Medications:    acetaminophen (TYLENOL) tablet 650 mg, 650 mg, Oral, Q6H PRN, Arabella Merles, PA-C   benztropine (COGENTIN) tablet 1 mg, 1 mg, Oral, BID PRN, Gilman Schmidt, Cloretta Ned, MD   Chlorhexidine Gluconate Cloth 2 % PADS 6 each, 6 each, Topical, Q0600, Delano Metz, MD   Chlorhexidine Gluconate Cloth 2 % PADS 6 each, 6 each, Topical, Q0600, Delano Metz, MD   isosorbide mononitrate (IMDUR) 24 hr tablet 30 mg, 30 mg, Oral, Daily, Truddie Hidden, Amanda, PA-C, 30 mg at 07/11/23 1249   OLANZapine (ZYPREXA) injection 10 mg, 10 mg, Intramuscular, Once PRN, Gilman Schmidt, Cloretta Ned, MD   OLANZapine zydis (ZYPREXA) disintegrating tablet 10 mg, 10 mg, Oral, QHS, Feller, Sophie C, MD, 10 mg at 07/10/23 2124   OLANZapine zydis (ZYPREXA) disintegrating tablet 10 mg, 10 mg, Oral, BID PRN, Adria Dill, MD, 10 mg at 07/11/23 1249   rosuvastatin (CRESTOR) tablet 5 mg, 5 mg, Oral, Daily, Truddie Hidden, Amanda, PA-C, 5 mg at 07/11/23 1249   sucroferric oxyhydroxide (VELPHORO) chewable tablet 1,000 mg, 1,000 mg, Oral, TID with meals, Arabella Merles, PA-C, 1,000 mg at 07/11/23 1255   ziprasidone (GEODON) injection 20 mg, 20 mg, Intramuscular, Once PRN, [name], PA-C  Current Outpatient Medications:    isosorbide mononitrate (IMDUR) 30 MG 24 hr tablet, Take 1 tablet (30 mg total) by mouth daily., Disp: 90 tablet, Rfl: 3   rosuvastatin (CRESTOR) 5 MG tablet, Take 1 tablet (5 mg total) by mouth daily., Disp: 90 tablet, Rfl: 3   sucroferric oxyhydroxide (VELPHORO) 500 MG chewable tablet, Chew 2 tablets (1,000 mg total) by mouth with  breakfast, with lunch, and with evening meal., Disp: 180 tablet, Rfl: 11   carvedilol (COREG) 6.25 MG tablet, Take 1 tablet (6.25 mg total) by mouth 2 (two) times daily with a meal. (Patient not taking: Reported on  07/08/2023), Disp: 60 tablet, Rfl: 12   isosorbide mononitrate (IMDUR) 60 MG 24 hr tablet, Take 0.5 tablets (30 mg total) by mouth daily. (Patient not taking: Reported on 07/08/2023), Disp: 15 tablet, Rfl: 0   ketoconazole (NIZORAL) 2 % cream, Apply 1 Application topically daily. (Patient not taking: Reported on 07/08/2023), Disp: 60 g, Rfl: 2   lidocaine-prilocaine (EMLA) cream, Apply 1 a small amount to skin three times a week apply to AVF 30-60 min prior to dialysis, wrap site w plastic wrap (Patient not taking: Reported on 07/08/2023), Disp: 30 g, Rfl: 11   polyethylene glycol powder (GLYCOLAX/MIRALAX) 17 GM/SCOOP powder, Take 17 g by mouth daily. (Patient not taking: Reported on 07/08/2023), Disp: 238 g, Rfl: 0   rosuvastatin (CRESTOR) 5 MG tablet, Take 1 tablet by mouth once a day (Patient not taking: Reported on 07/08/2023), Disp: 30 tablet, Rfl: 11   sucroferric oxyhydroxide (VELPHORO) 500 MG chewable tablet, Chew 3 tablets (1,500 mg total) by mouth 3 (three) times daily with meals. (Patient not taking: Reported on 07/08/2023), Disp: 810 tablet, Rfl: 3  Allergies: Allergies  Allergen Reactions   Penicillin G     Thomes Lolling, NP

## 2023-07-11 NOTE — ED Notes (Signed)
 Pt up, ambulating in common area for exercise & movement.

## 2023-07-11 NOTE — Progress Notes (Signed)
 [facility] Progress Note  Subjective:  Seen in HD unit No c/o's today  Vitals:   07/11/23 1133 07/11/23 1135 07/11/23 1145 07/11/23 1147  BP: 133/88   138/84  Pulse: 91  95 86  Resp: 16  14 13   Temp:      TempSrc:      SpO2: 98%  99% 97%  Weight:  71.3 kg      Exam: Gen alert, no distress Sclera anicteric, throat clear  No jvd or bruits Chest clear bilat to bases RRR no MRG Abd soft ntnd no mass or ascites +bs Ext trace LE  edema Neuro is alert, Ox 3 , nf    RUA AVF+bruit     Renal-related home meds: - coreg 6.25 bid  - imdur 30 - velphoro 1000mg  ac tid     OP HD: [facility] MWF 4h  B400  76.5kg  2K bath AVF  Heparin 2400 - last OP HD 2/28, post wt 76.8kg, getting to dry wt - rocaltrol 0.25 mcg po three times per week - mircera 30 mcg IV q 4 wks, last 2/05, due 3/05 today     Assessment/ Plan: Homicidal behavior - h/o bipolar disorder, see ED notes per psychiatry. Boarding in ED for now. Under IVC. Per ED/ psychiatry.  ESRD - on HD MWF. Last HD was last Friday. Had HD here Wednesday. Next HD today. Unfortunately due to patient's behavior he has been released from [facility] outpatient dialysis. Will need to come to ED for hospital dialysis until/ unless he can find another HD group that will take him.  HTN - BP's good, cont home meds.  Volume - no vol excess, CXR clear on RA. 2kg up pre HD yesterday. Euvolemic today. UF 1-2 L w/ HD tomorrow.  Anemia of esrd - Hb 9.3, due for esa today will order darbe 30 mcg weekly sq while here.  Secondary hyperparathyroidism - CCa in range, add phos, cont binders w/ meals.     Vinson Moselle MD  [facility] 07/11/2023, 4:21 PM  Recent Labs  Lab 07/09/23 1435 07/11/23 0746  HGB 8.2* 9.1*  ALBUMIN 3.1* 3.2*  CALCIUM 7.9* 7.5*  PHOS 9.5* 10.1*  CREATININE 16.05* 12.77*  K 4.5 4.7   No results for input(s): "IRON", "TIBC", "FERRITIN" in the last 168 hours. Inpatient medications:  Chlorhexidine Gluconate Cloth  6 each Topical  Q0600   Chlorhexidine Gluconate Cloth  6 each Topical Q0600   isosorbide mononitrate  30 mg Oral Daily   OLANZapine zydis  10 mg Oral QHS   rosuvastatin  5 mg Oral Daily   sucroferric oxyhydroxide  1,000 mg Oral TID with meals    acetaminophen, benztropine, OLANZapine, OLANZapine zydis, ziprasidone

## 2023-07-12 DIAGNOSIS — F250 Schizoaffective disorder, bipolar type: Secondary | ICD-10-CM | POA: Diagnosis not present

## 2023-07-12 DIAGNOSIS — R45850 Homicidal ideations: Secondary | ICD-10-CM | POA: Diagnosis not present

## 2023-07-12 MED ORDER — OLANZAPINE 5 MG PO TBDP
15.0000 mg | ORAL_TABLET | Freq: Every day | ORAL | Status: DC
  Administered 2023-07-12 – 2023-07-13 (×2): 15 mg via ORAL
  Filled 2023-07-12 (×2): qty 3

## 2023-07-12 NOTE — ED Notes (Signed)
 Pt continually coming out of room requesting to speak with psychiatry team. Notified via secure chat. Pt redirected to room. Pt requested bible and his reading glasses from his belongings, provided to pt.

## 2023-07-12 NOTE — Consult Note (Cosign Needed Addendum)
 [facility] Psychiatric Consult Follow-up  Patient Name: .Paul [name]  MRN: [id number]  DOB: 1965-11-05  Consult Order details:  Orders (From admission, onward)     Start     Ordered   07/08/23 2026  CONSULT TO CALL ACT TEAM       Ordering Provider: [name], PA-C  Provider:  (Not yet assigned)  Question:  Reason for Consult?  Answer:  Psych consult   07/08/23 2025             Mode of Visit: In person    Psychiatry Consult Evaluation  Service Date: July 12, 2023 LOS:  LOS: 0 days  Chief Complaint: "God downloaded a message to the holy ghost on January 20th"  Primary Psychiatric Diagnoses  Schizoaffective disorder, bipolar type (HCC)  Assessment   Per. Dr. Gilman Schmidt, MD  Paul [name] is a 58 year old male with a history of schizophrenia, bipolar disorder, CKD, CHF, hypertension who presents to the ED on an IVC after threatening to cut the heads off of women who work in the facility. Per chart review, patient with a history of violence. Patient also reportedly posting YouTube videos "He was calling for men to pick up Swords, kill women and feminists and anybody who supports trans rights, 'chop them up and feed them to the birds.'  Patient also has been stating that he has a calling from God to lead this nation to greatness and that he is the only person who can do it." Per chart review, has been having angry outbursts in the ED and also threatened to "put a syringe in the eye of the first psychiatrist who talks to him." Psychiatry consulted for medication management.    07/12/2023  Upon reevaluation, patient today presents meaningfully better, is not making religious or grandiose expressions, continues to deny suicidal and or homicidal ideations, and is now expressing intent and desire to follow-up with outpatient resources, continue to take medications upon discharge, and to start the process of safety planning with the individuals that are close with the patient  for safe discharge.  Discussed with patient that if reports over the night are good, and the patient is doing well tomorrow upon reevaluation with this provider, and safety planning is able to be put in place with the patient's landlord, whom lives with him, and is known for many years, disposition is likely for psychiatric clearance to follow-up with outpatient services.  Diagnoses:  Active Hospital problems: Principal Problem:   Schizoaffective disorder, bipolar type (HCC)    Plan   ## Psychiatric Medication Recommendations:   -Recommend continue/increase olanzapine 10 mg p.o. nightly --> olanzapine 15 mg p.o. nightly  ## Medical Decision Making Capacity: Not specifically addressed in this encounter  ## Further Work-up: UDS/UA  ## Disposition:--We recommend inpatient mental health hospitalization under involuntary commitment  ## Behavioral / Environmental: -Agitation/safety precautions    ## Safety and Observation Level:  - Based on my clinical evaluation, I estimate the patient to be at low risk of self harm in the current setting. - At this time, we recommend  1:1 Observation. This decision is based on my review of the chart including patient's history and current presentation, interview of the patient, mental status examination, and consideration of suicide risk including evaluating suicidal ideation, plan, intent, suicidal or self-harm behaviors, risk factors, and protective factors. This judgment is based on our ability to directly address suicide risk, implement suicide prevention strategies, and develop a safety plan while the patient is  in the clinical setting. Please contact our team if there is a concern that risk level has changed.  CSSR Risk Category:C-SSRS RISK CATEGORY: No Risk  Suicide Risk Assessment: Patient has following modifiable risk factors for suicide: social isolation, recklessness, and medication noncompliance, access to a sword, which we are addressing by  treatment recommendations. Patient has following non-modifiable or demographic risk factors for suicide: male gender and psychiatric hospitalization Patient has the following protective factors against suicide: Access to outpatient mental health care, Cultural, spiritual, or religious beliefs that discourage suicide, no history of suicide attempts, and no history of NSSIB  Thank you for this consult request. Recommendations have been communicated to the primary team.  We will continue to follow at this time.   Lenox Ponds, NP     History of Present Illness   Per. Dr. Gilman Schmidt, MD  Paul [name] is a 58 year old male with a history of schizophrenia, bipolar disorder, CKD, CHF, hypertension who presents to the ED on an IVC after threatening to cut the heads off of women who work in the facility. Per chart review, patient with a history of violence. Patient also reportedly posting YouTube videos "He was calling for men to pick up Swords, kill women and feminists and anybody who supports trans rights, 'chop them up and feed them to the birds.'  Patient also has been stating that he has a calling from God to lead this nation to greatness and that he is the only person who can do it." Per chart review, has been having angry outbursts in the ED and also threatened to "put a syringe in the eye of the first psychiatrist who talks to him." Psychiatry consulted for medication management.   Patient seen today at the [facility] emergency department for face-to-face psychiatric reevaluation.  Upon reevaluation, patient endorses that, "this is all a misunderstanding", states that he does not need to be here in the hospital.   Expanding on this, patient endorses that he has been hearing from the [place] for approximately 14 years now, but recently on May 26, 2023, when Trump was at the inauguration to accept the presidency, states that, "the [employer] for me, to the holy ghost inside of me, to  spread to the world, and get people ready for the great day of judgment and slaughter that is to come", and that because of telling others this, states that, "a man in a green shirt had me involuntarily committed and brought here, but this whole thing is again, a misunderstanding".   Patient asked to expand on what he means by, "great day of judgment and slaughter", states again that the reports are all a, "misunderstanding", and that he does not need to be here, then begins telling this Clinical research associate about, "revelations" that he has received like, "Moses and Jeri [place]", and begins to discuss vaguely plans he has received from, "the [place]" about men needing to buy swords, how feminist will "surely die" in the "slaughter to come", and I, "made a bunch of videos that I edited, directed, and wrote out in a three part series that I put on YouTube, but that is a really long story... basically god has told me that other people are not going to understand, but that I need to try and relay a message of the great day that is to come, to try to get people to repent nationally, because I am the only person that can do it".  Patient asked directly about homicidal  ideations, and previous statements made, to which the patient at this time vaguely expresses homicidal ideations indirectly towards, "well, look, when judgment comes, the Lord's not playing games, slaughter will happen, I have my part in the great day to come".  Patient endorses no current auditory or visual hallucinations, denies hearing reports from the [place] during evaluation, and additionally states, "I have not had a message downloaded since 22nd January".  Objectively, patient does not appear to be responding to internal and/or external stimuli.  Patient endorses no suicidal ideations.  Patient orientation is intact, no concerns for fluctuations in consciousness.  Patient endorses a frustrated mood, with an oddly constricted to neutral affect, disheveled  presentation, and a atypical interpersonal style.  Patient endorses he has been eating and sleeping normally.  Patient endorses good toleration of medications, no appreciable side effects.  Per chart review/nursing: Patient earlier today had an incident of destroying a nursing station computer, which prompted multiple law enforcement to be needed to restrain the patient for severe agitation/psychosis so emergent medications could be given, and ultimately the patient needing to be placed in 4 point restraints for safety.  Patient since severe incident earlier has been showing improvements, has been amenable to oral medications prescribed, has been more directable by staff, has been eating, and has been able to sleep.  07/10/2023  Patient seen today at the [facility] emergency department for face-to-face psychiatric reevaluation.  Upon reevaluation, patient immediately asserts that his mood is frustrated that he is still in the emergency department awaiting inpatient mental health hospital placement, states that he feels that this is not needed, and that, "I feel like things were just misunderstood, I am not going to kill anyone, I do not want to hurt anyone, I would not do that, I've just been trying to tell everyone that we need a national revival, the lord demands it, otherwise we're all just going to perish". Patient during this time is notably calm to mildly frustrated, but remains with an atypical interpersonal style, variable eye contact, and an oddly constricted to neutral affect.  Patient expands on this, states that it is true, that he recently bought a sword because of, "messages from the [place] being downloaded to me on May 27, 2022 during the inauguration", but that, "I am not crazy, I just received messages from the [place], I do not plan to hurt anyone, but God is going to when he comes back".  Patient endorses formally when asked, he is not experiencing any homicidal and suicidal ideations, he  continues to not receive any further messages from the [place], denies auditory and or visual hallucinations, and objectively, does not appear to be responding to internal and/or external stimuli.  Patient orientation is intact, no concerns for fluctuations in consciousness.  Patient endorses that he feels like he slept good over the night and continues to eat normally.  Patient endorses that he believes that he has been compliant with medications, but when confronted about scheduled olanzapine that has been reported to be refused, patient apologizes for this at this time, states that he has no objection to taking oral medications, states that, "I have been, tell the nurse I will take my medication right now".  Patient endorsed good toleration of medications, no appreciable side effects, and upon physical exam, no appreciable evidence of dystonia and/or other EPS symptomology, such as akathisia.  Discussed with patient that the recommendation remains for inpatient mental health hospitalization, given the recent events that transpired which led to  this encounter, the patient reportedly being selectively compliant with medications, evaluations conducted to date, and reports from staff about incidents of increased agitations/aggression.  Patient verbalized understanding, states that he will try to be more calm and cooperative, as well as take his scheduled medications.  Per chart review/nursing: Patient has been selectively compliant with medications.  Patient has not had any severe incidents of aggression and/or agitations towards staff, but has had more moderate incidents requiring increase staff intervention to redirect the patient.  Patient has been able to sleep and eat.  07/11/2023   Patient seen today at the [facility] emergency department for face-to-face psychiatric reevaluation.   Upon reevaluation post dialysis, patient presents with anger and irritability.  He yells at provider "you are all  dangerous and bullies."  He yells about not needing medication and yelling "I'm fine" and "I want out."  Patient required redirection when coming out of his room and raising his voice.  He is angry because "I'm being forced to take medication I don't need."  Patient continues to have limited insight; his behavior today and reports from initial events that led to this encounter supports continued recommendation for inpatient mental health hospitalization at this time, for safety and stabilization of the patient.   During evaluation Paul [name] is initially seated on his bed in no acute distress.  He is alert & oriented x 4, and immediately becomes agitated and verbally aggressive.  His mood is frustrated and aggressive with congruent affect.  He has normal loud speech, and postures aggressively.  Objectively there is no evidence of psychosis or mania; there is evidence of delusional thinking. Pt does not appear to be responding to internal or external stimuli.  He denies suicidal/self-harm/homicidal ideation, psychosis, and paranoia.     Per chart review/nursing: Patient has been selectively compliant with medications.  Patient has not had any severe incidents of aggression and/or agitations towards staff, but has had more moderate incidents requiring increase staff intervention to redirect the patient.  Patient has been able to sleep and eat.  07/12/2023  Patient seen today at the [facility] emergency department for face-to-face psychiatric reevaluation.  Upon reevaluation, patient endorses an euthymic mood with a much improved interpersonal style and presentation, appreciably has been attending to ADLs.  Patient endorses good sleep and eating, as well as endorses good toleration of medications that have been started, no endorsements of EPS symptomology.  Patient orientation is intact, no concerns for fluctuations in consciousness.  Patient endorses no suicidal and or homicidal ideations, denies  auditory and or visual hallucinations, and objectively, does not appear to be presenting with psychotic features or paranoia.  Patient denies ideas of reference, as well as does not overtly discuss with this provider previous delusional themes of religiosity and grandiosity, continues to distance himself from these delusional themes.  Patient endorses that he feels that he is ready to discharge, apologizes for his anger yesterday with previous provider, states that he is just frustrated he is still in the emergency department, when he feels that he does not need to be here, and that he can follow-up with outpatient resources.  Patient asked this writer to call his landlord, whom he has been very close with for many years, and that lives in the same building as him; states that he would like this provider to attempt to do safety planning for safe discharge with his landlord, given this provider's expressed concerns about the patient having a weapon or other means to  harm others, from reports this encounter.  Collateral, Mr. Merrilyn Puma, spoken to at [phone], patient's landlord  Call placed and extensive conversation held with the patient's landlord, Mr. Wallace Cullens.  Mr. Wallace Cullens reports that he has known the patient for a long time, states that he was hoping that someone would give him a call and give him updates, states that he is aware that the patient has recently had a "mental breakdown", because he states that, "I was there when the police took him away, poor guy".  Mr. Wallace Cullens reports that he does have some safety concerns with how the patient has been talking prior to coming into the emergency department, wants to see the patient get the help that he needs, to which this provider educated Mr. Wallace Cullens that the patient has been taking medications and has been showing improvements, which he states he is happy about.  Discussed with Mr. Wallace Cullens that it was this provider's intention to hopefully do safety planning for  safe discharge of the patient soon, granted that the patient continues doing better, to which Mr. Wallace Cullens reported that he could work with this provider and the patient to implement strong safety plan, of which would notably include removing any dangerous weapons or items from their housing location that they both stay at, but that he unfortunately will not be back in town until tomorrow from [place].  Discussed with Mr. Wallace Cullens that the plan at this time is to make further adjustments to the patient's medication regimen, and if reevaluation tomorrow is promising, and safety planning with himself is able to be performed, will very likely be for discharge, and for the patient to follow-up with outpatient recommendations.  Mr. Wallace Cullens verbalized that this would be a good plan, states that tomorrow he will make himself available for extensive meeting for safety planning and possible discharge.  Review of Systems  Psychiatric/Behavioral:  Negative for depression, hallucinations (States none since "january 22nd, 2024"), substance abuse and suicidal ideas. The patient is not nervous/anxious and does not have insomnia.   All other systems reviewed and are negative.    Psychiatric and Social History  Psychiatric History:  Information collected from chart review/patient/IVC paperwork  Prev Dx/Sx: Schizophrenia, schizoaffective disorder, bipolar Current Psych Provider: Unknown Home Meds (current): Unknown Previous Med Trials: Haldol, Paxil Therapy: None endorsed  Prior Psych Hospitalization: Yes, per patient Prior Self Harm: Unknown Prior Violence: Yes, per chart review, history of violence, history of endorsing needing to kill others  Family Psych History: Unknown Family Hx suicide: Unknown  Social History:  Developmental Hx: Unknown Educational Hx: Unknown Occupational Hx: Unknown Legal Hx: Involuntary commitment Living Situation: Lives in a halfway house Spiritual Hx: Christian  Access to  weapons/lethal means: Reportedly has a sword recently purchased; patient today affirms he has a sword that he bought recently due to being told by, "the Lord"  Substance History  Alcohol: None endorsed or reported Type of alcohol : None endorsed or reported Last Drink : None endorsed or reported Number of drinks per day : None endorsed or reported History of alcohol withdrawal seizures : None endorsed or reported History of DT's : None endorsed or reported Tobacco: : Reportedly smoked 30 years ago, smoked for 1 year Illicit drugs: : None endorsed or reported Prescription drug abuse: : None endorsed or reported Rehab hx: : None endorsed or reported  Exam Findings  Physical Exam: As below Vital Signs:  Temp:  [98 F (36.7 C)-98.1 F (36.7 C)] 98.1 F (36.7 C) (03/08  1008) Pulse Rate:  [84] 84 (03/08 1008) Resp:  [16-17] 16 (03/08 1008) BP: (137-168)/(84) 168/84 (03/08 1008) SpO2:  [100 %] 100 % (03/08 1019) Blood pressure (!) 168/84, pulse 84, temperature 98.1 F (36.7 C), temperature source Oral, resp. rate 16, weight 71.3 kg, SpO2 100%. Body mass index is 27.84 kg/m.  Physical Exam Vitals and nursing note reviewed.  Constitutional:      General: He is not in acute distress.    Appearance: He is obese. He is not ill-appearing, toxic-appearing or diaphoretic.  Pulmonary:     Effort: Pulmonary effort is normal.  Skin:    General: Skin is warm and dry.  Neurological:     Mental Status: He is alert and oriented to person, place, and time.     Motor: No tremor or seizure activity.     Comments: Denies EPS symptomology, and appreciably upon physical exam, does not present with evidence of dystonia and/or akathisia.  Psychiatric:        Attention and Perception: Attention normal. He is attentive. He does not perceive auditory or visual hallucinations.        Mood and Affect: Mood and affect normal.        Speech: Speech normal.        Behavior: Behavior is cooperative.         Thought Content: Thought content normal. Thought content is not paranoid or delusional. Thought content does not include homicidal or suicidal ideation.        Cognition and Memory: Cognition and memory normal.     Mental Status Exam: General Appearance: Less disheveled, less bizarre and atypical in interpersonal style  Orientation:  Full (Time, Place, and Person)  Memory:   Variable, largely intact  Concentration:  Concentration: Fair and Attention Span: Fair  Recall:  Fair  Attention  Fair  Eye Contact: Variable to fair  Speech: Clear and coherent, but with disruption and/or irregular flow speech (I.e., frequently with noticeable pauses, hesitations, or breaks in natural rhythm)  Language:  Fair  Volume:  Normal  Mood: Frustrated to euthymic  Affect: Largely neutral to mildly constricted  Thought Process: Largely goal-directed and linear to circumstantial  Thought Content: Logical goal directed  Suicidal Thoughts:  No  Homicidal Thoughts:  No  Judgement: Improving  Insight: Improving  Psychomotor Activity:  Normal  Akathisia:  No  Fund of Knowledge:  Fair      Assets:  Health and safety inspector Housing Leisure Time Physical Health Resilience  Cognition:  WNL  ADL's:  Intact  AIMS (if indicated):   0     Other History   These have been pulled in through the EMR, reviewed, and updated if appropriate.  Family History:  The patient's family history includes Hypertension in his father; Stroke in his father.  Medical History: Past Medical History:  Diagnosis Date   Bipolar disorder (HCC)    Central retinal artery occlusion    with macular infarction of the right eye. status post posterior vitrectomy and photocoagulation with insertion of a shunt in 2006.   CHF (congestive heart failure) (HCC)    chronic mixed syst/diast. 02/2009 echo: Systolic function was mildly reduced. The estimated ejection fraction was in 45%, global HK, Grade I Diast dysfxn.   Chronic kidney  disease    ESRD   Heart failure    Obesity    Osteoarthritis    s/p right hip replacement in 2001   Schizophrenia (HCC)    Severe uncontrolled hypertension  Surgical History: Past Surgical History:  Procedure Laterality Date   AV FISTULA PLACEMENT Right 07/08/2022   Procedure: RIGHT RADIOCEPHALIC ARTERIOVENOUS (AV) FISTULA CREATION;  Surgeon: Victorino Sparrow, MD;  Location: [facility] OR;  Service: Vascular;  Laterality: Right;   IR FLUORO GUIDE CV LINE RIGHT  05/13/2022   IR US GUIDE VASC ACCESS RIGHT  05/13/2022   JOINT REPLACEMENT     right hip replacement   right eye surgery     for glaucoma   TOTAL HIP ARTHROPLASTY Bilateral 1997, 2001     Medications:   Current Facility-Administered Medications:    acetaminophen (TYLENOL) tablet 650 mg, 650 mg, Oral, Q6H PRN, Arabella Merles, PA-C   benztropine (COGENTIN) tablet 1 mg, 1 mg, Oral, BID PRN, Gilman Schmidt, Cloretta Ned, MD   Chlorhexidine Gluconate Cloth 2 % PADS 6 each, 6 each, Topical, Q0600, Delano Metz, MD   Chlorhexidine Gluconate Cloth 2 % PADS 6 each, 6 each, Topical, Q0600, Delano Metz, MD   isosorbide mononitrate (IMDUR) 24 hr tablet 30 mg, 30 mg, Oral, Daily, Truddie Hidden, Amanda, PA-C, 30 mg at 07/12/23 1017   OLANZapine (ZYPREXA) injection 10 mg, 10 mg, Intramuscular, Once PRN, Gilman Schmidt, Cloretta Ned, MD   OLANZapine zydis (ZYPREXA) disintegrating tablet 10 mg, 10 mg, Oral, QHS, Feller, Sophie C, MD, 10 mg at 07/11/23 2118   OLANZapine zydis (ZYPREXA) disintegrating tablet 10 mg, 10 mg, Oral, BID PRN, Adria Dill, MD, 10 mg at 07/11/23 1249   rosuvastatin (CRESTOR) tablet 5 mg, 5 mg, Oral, Daily, Truddie Hidden, Amanda, PA-C, 5 mg at 07/12/23 1017   sucroferric oxyhydroxide (VELPHORO) chewable tablet 1,000 mg, 1,000 mg, Oral, TID with meals, Arabella Merles, PA-C, 1,000 mg at 07/12/23 1248   ziprasidone (GEODON) injection 20 mg, 20 mg, Intramuscular, Once PRN, [name], PA-C  Current Outpatient Medications:    isosorbide  mononitrate (IMDUR) 30 MG 24 hr tablet, Take 1 tablet (30 mg total) by mouth daily., Disp: 90 tablet, Rfl: 3   rosuvastatin (CRESTOR) 5 MG tablet, Take 1 tablet (5 mg total) by mouth daily., Disp: 90 tablet, Rfl: 3   sucroferric oxyhydroxide (VELPHORO) 500 MG chewable tablet, Chew 2 tablets (1,000 mg total) by mouth with breakfast, with lunch, and with evening meal., Disp: 180 tablet, Rfl: 11   carvedilol (COREG) 6.25 MG tablet, Take 1 tablet (6.25 mg total) by mouth 2 (two) times daily with a meal. (Patient not taking: Reported on 07/08/2023), Disp: 60 tablet, Rfl: 12   isosorbide mononitrate (IMDUR) 60 MG 24 hr tablet, Take 0.5 tablets (30 mg total) by mouth daily. (Patient not taking: Reported on 07/08/2023), Disp: 15 tablet, Rfl: 0   ketoconazole (NIZORAL) 2 % cream, Apply 1 Application topically daily. (Patient not taking: Reported on 07/08/2023), Disp: 60 g, Rfl: 2   lidocaine-prilocaine (EMLA) cream, Apply 1 a small amount to skin three times a week apply to AVF 30-60 min prior to dialysis, wrap site w plastic wrap (Patient not taking: Reported on 07/08/2023), Disp: 30 g, Rfl: 11   polyethylene glycol powder (GLYCOLAX/MIRALAX) 17 GM/SCOOP powder, Take 17 g by mouth daily. (Patient not taking: Reported on 07/08/2023), Disp: 238 g, Rfl: 0   rosuvastatin (CRESTOR) 5 MG tablet, Take 1 tablet by mouth once a day (Patient not taking: Reported on 07/08/2023), Disp: 30 tablet, Rfl: 11   sucroferric oxyhydroxide (VELPHORO) 500 MG chewable tablet, Chew 3 tablets (1,500 mg total) by mouth 3 (three) times daily with meals. (Patient not taking: Reported on 07/08/2023), Disp: 810 tablet, Rfl: 3  Allergies: Allergies  Allergen Reactions   Penicillin Howard Pouch, NP

## 2023-07-12 NOTE — Progress Notes (Signed)
 Patient has been denied by [facility] due to no appropriate beds available. Patient meets [facility] inpatient criteria per Arsenio Loader, NP. Patient has been faxed out to the following facilities:   [facility] [address], [place] [address] [phone]  [facility] [address] [place], [place] [address] [phone]  [facility] [address] [place], [place] [address] [phone]  [facility] [address] [place] [address] [phone]  [facility] [address] [place] [address] [phone]  [facility] [address], [place] [address] [phone]  [facility] [address] [place] [address] [phone]  [facility] EFAX [address] [place], [place] [phone]  [facility] [address], [place] [address] [phone]  [facility] [address] [place], [place] [address] [phone]  [facility] [address] [place] [address] [phone]    Damita Dunnings, MSW, LCSW-A  4:32 PM 07/12/2023

## 2023-07-12 NOTE — ED Notes (Signed)
IVC PAPERWORK CURRENT

## 2023-07-12 NOTE — Progress Notes (Signed)
 [facility] Progress Note  Subjective:  Seen in ED  No c/o's today Awaiting transfer to [facility] psych ward  Vitals:   07/11/23 1147 07/11/23 1949 07/12/23 1008 07/12/23 1019  BP: 138/84 137/84 (!) 168/84   Pulse: 86 84 84   Resp: 13 17 16    Temp:  98 F (36.7 C) 98.1 F (36.7 C)   TempSrc:  Axillary Oral   SpO2: 97% 100% 100% 100%  Weight:        Exam: Gen alert, no distress Sclera anicteric, throat clear  No jvd or bruits Chest clear bilat to bases RRR no MRG Abd soft ntnd no mass or ascites +bs Ext trace LE  edema Neuro is alert, Ox 3 , nf    RUA AVF+bruit     Renal-related home meds: - coreg 6.25 bid  - imdur 30 - velphoro 1000mg  ac tid     OP HD: G-O MWF 4h  B400  76.5kg  2K bath AVF  Heparin 2400 - last OP HD 2/28, post wt 76.8kg, getting to dry wt - rocaltrol 0.25 mcg po three times per week - mircera 30 mcg IV q 4 wks, last 2/05, due 3/05 today     Assessment/ Plan: Homicidal behavior - h/o bipolar disorder, see ED notes per psychiatry. Boarding in ED for now. Under IVC. Awaiting transfer to psych hospital at [facility]. Per ED/ psychiatry.  ESRD - on HD MWF. Last HD was last Friday. Had HD here Wed and Friday. Next HD Monday.  Outpatient hemodialysis - pt was discharged from [facility] outpatient dialysis due to behavior issues this week. When released will need to come to hospital for hospital dialysis.  Volume - no vol excess, CXR clear on admission. Euvolemic today. Under dry wt if wt's are correct. Follow.  Anemia of esrd - Hb 9.3, due for esa today will order darbe 30 mcg weekly sq while here.  Secondary hyperparathyroidism - CCa in range, phos high. Cont binders ac    Rob Arlean Hopping MD  [facility] 07/12/2023, 10:59 AM  Recent Labs  Lab 07/09/23 1435 07/11/23 0746  HGB 8.2* 9.1*  ALBUMIN 3.1* 3.2*  CALCIUM 7.9* 7.5*  PHOS 9.5* 10.1*  CREATININE 16.05* 12.77*  K 4.5 4.7   No results for input(s): "IRON", "TIBC", "FERRITIN" in the last 168  hours. Inpatient medications:  Chlorhexidine Gluconate Cloth  6 each Topical Q0600   Chlorhexidine Gluconate Cloth  6 each Topical Q0600   isosorbide mononitrate  30 mg Oral Daily   OLANZapine zydis  10 mg Oral QHS   rosuvastatin  5 mg Oral Daily   sucroferric oxyhydroxide  1,000 mg Oral TID with meals    acetaminophen, benztropine, OLANZapine, OLANZapine zydis, ziprasidone

## 2023-07-12 NOTE — ED Provider Notes (Signed)
 Emergency Medicine Observation Re-evaluation Note  Paul [name] is a 58 y.o. male, seen on rounds today.  Pt initially presented to the ED for complaints of Medical Clearance (IVC) Currently, the patient is asleep.  Pt presented for psychosis.  He is a dialysis patient and had dialysis yesterday (HD MWF)  Physical Exam  BP 137/84 (BP Location: Left Arm)   Pulse 84   Temp 98 F (36.7 C) (Axillary)   Resp 17   Wt 71.3 kg   SpO2 100%   BMI 27.84 kg/m  Physical Exam General: asleep Cardiac: rr Lungs: clear Psych: asleep  ED Course / MDM  EKG:EKG Interpretation Date/Time:  Tuesday July 08 2023 21:01:24 EST Ventricular Rate:  99 PR Interval:  178 QRS Duration:  90 QT Interval:  378 QTC Calculation: 485 R Axis:   9  Text Interpretation: Normal sinus rhythm Septal infarct , age undetermined Abnormal ECG When compared with ECG of 13-Jun-2023 17:33, PREVIOUS ECG IS PRESENT Confirmed by Zadie Rhine ([id number]) on 07/09/2023 7:08:41 AM  I have reviewed the labs performed to date as well as medications administered while in observation.  Recent changes in the last 24 hours include dialysis yesterday.  Plan  Current plan is for inpatient psych placement with access to HD.    Jacalyn Lefevre, MD 07/12/23 [phone]

## 2023-07-13 DIAGNOSIS — F250 Schizoaffective disorder, bipolar type: Secondary | ICD-10-CM | POA: Diagnosis not present

## 2023-07-13 MED ORDER — CHLORHEXIDINE GLUCONATE CLOTH 2 % EX PADS: 6.0000 | MEDICATED_PAD | Freq: Every day | CUTANEOUS | Status: DC

## 2023-07-13 MED ORDER — DARBEPOETIN ALFA 40 MCG/0.4ML IJ SOSY
40.0000 ug | PREFILLED_SYRINGE | INTRAMUSCULAR | Status: DC
  Administered 2023-07-14: 40 ug via SUBCUTANEOUS
  Filled 2023-07-13: qty 0.4

## 2023-07-13 MED ORDER — DARBEPOETIN ALFA 40 MCG/0.4ML IJ SOSY: 40.0000 ug | PREFILLED_SYRINGE | INTRAMUSCULAR | Status: DC

## 2023-07-13 NOTE — Progress Notes (Signed)
 Patient ID: Paul [name], male   DOB: 03-09-1966, 58 y.o.   MRN: [id number] This writer reach out to out of system hospitals that may accommodate due to barriers in placement related to dialysis need. Called  to [facility]) about this patient.at [phone]. I spoke with Corrie Dandy and Jonny Ruiz in the triage department about this pt getting referred to their Med Psych unit. They shared with me that the med psych unit that they have is full, however they also have a HYBRID MED FLOOR unit in which they take medically unstable patients including patients with active covid, flu or other co -occurring medical issues including dialysis. They indicated the referral process for this unit as it is on the medical floor is a DOCTOR TO DOCTOR referral only and that the patient must be deemed medically unstable enough to be admitted to that unit.  MD can call the transfer center to do a [facility] phone referral at [phone] and to SPECIFICALLY ask for the [facility] UNIT DOCTOR.when making this referral. Information was relayed to the treatment team.

## 2023-07-13 NOTE — ED Notes (Signed)
 Assumed care of pt. Pt is currently sleeping in his bed. Food tray is left outside of his room for when he wakes up.

## 2023-07-13 NOTE — ED Notes (Signed)
 Pt woke up from sleeping and came up to the nurses desks and asked what time it is. Pt asked about when he will receive dialysis and RN updated him. Pt was given his tray of food and medications. Pt told the nurses that he is supposed to be going home today and RN informed pt that after dialysis the provider will be contacted about his question. No other needs at this time

## 2023-07-13 NOTE — ED Provider Notes (Signed)
 Emergency Medicine Observation Re-evaluation Note  Paul [name] is a 58 y.o. male, seen on rounds today.  Pt initially presented to the ED for complaints of Medical Clearance (IVC) Currently, the patient is awaiting behavioral health placement complicated by the fact that he is a dialysis patient.  Patient normally dialyzed Monday Wednesdays and Fridays.  Patient is IVC.  Physical Exam  BP (!) 149/81 (BP Location: Left Arm)   Pulse (!) 105   Temp 98.3 F (36.8 C) (Oral)   Resp 16   Wt 71.3 kg   SpO2 100%   BMI 27.84 kg/m  Physical Exam General: Nontoxic no acute distress Cardiac:  Lungs: Clear Psych: Baseline  ED Course / MDM  EKG:EKG Interpretation Date/Time:  Tuesday July 08 2023 21:01:24 EST Ventricular Rate:  99 PR Interval:  178 QRS Duration:  90 QT Interval:  378 QTC Calculation: 485 R Axis:   9  Text Interpretation: Normal sinus rhythm Septal infarct , age undetermined Abnormal ECG When compared with ECG of 13-Jun-2023 17:33, PREVIOUS ECG IS PRESENT Confirmed by Paul [name] ([id number]) on 07/09/2023 7:08:41 AM  I have reviewed the labs performed to date as well as medications administered while in observation.  Recent changes in the last 24 hours include patient requesting dialysis today.  But patient is in no acute distress.  Patient scheduled for dialysis tomorrow.  Nephrology was contacted and they are planning on that..  Plan  Current plan is for waiting placement by behavioral health also planning dialysis tomorrow his days are Monday Wednesday Friday.    Vanetta Mulders, MD 07/13/23 1719

## 2023-07-13 NOTE — ED Notes (Signed)
 RN rounded on pt and is sleeping. Chest rises and fall appropriately and no other needs at this time

## 2023-07-13 NOTE — ED Notes (Signed)
 Pt is asleep in bed.

## 2023-07-13 NOTE — ED Notes (Signed)
 Pt awake and asked for a tr ashcan and some towels to clean his room. Pt was given supplies as requested. He cleaned his room and returned the trash can. He is now in there eating some crackers

## 2023-07-13 NOTE — Progress Notes (Signed)
  [facility] Progress [facility]  Subjective:  Seen in ED  No c/o's today Told him he can no longer go to his OP HD unit for dialysis And that he should come to hospital for hospital HD 2-3x/ wk  Vitals:   07/11/23 1949 07/12/23 1008 07/12/23 1019 07/12/23 2100  BP: 137/84 (!) 168/84  (!) 149/81  Pulse: 84 84  (!) 105  Resp: 17 16  16   Temp: 98 F (36.7 C) 98.1 F (36.7 C)  98.3 F (36.8 C)  TempSrc: Axillary Oral  Oral  SpO2: 100% 100% 100% 100%  Weight:        Exam: Gen alert, no distress Sclera anicteric, throat clear  No jvd or bruits Chest clear bilat to bases RRR no MRG Abd soft ntnd no mass or ascites +bs Ext trace LE  edema Neuro is alert, Ox 3 , nf    RUA AVF+bruit     Renal-related home meds: - coreg 6.25 bid  - imdur 30 - velphoro 1000mg  ac tid     OP HD: G-O MWF 4h  B400  76.5kg  2K bath AVF  Heparin 2400 - last OP HD 2/28, post wt 76.8kg, getting to dry wt - rocaltrol 0.25 mcg po three times per week - mircera 30 mcg IV q 4 wks, last 2/05, due 3/05      Assessment/ Plan: Homicidal behavior - h/o bipolar disorder, see ED notes per psychiatry. Boarding in ED for now. Under IVC. Awaiting transfer to psych facility. Per ED/ psychiatry.  ESRD - pt admitted after being released from his OP HD unit for errant behavior. Had HD here Wed/ Friday. Next HD Monday. Told him he can no longer go to his OP HD unit for dialysis and that he should come to hospital for hospital HD 2-3x/ wk after he is dc'd home.  Volume - no vol excess, CXR clear on admission. Euvolemic today. Under dry wt if wt's are correct.  Anemia of esrd - Hb 8-10 here, overdue for esa, will start darbe here 40 mcg weekly sq. Get fe/tibc/ ferritin.   Secondary hyperparathyroidism - CCa in range, phos high. Cont binders ac    Rob Arlean Hopping MD  [facility] 07/13/2023, 5:02 PM  Recent Labs  Lab 07/09/23 1435 07/11/23 0746  HGB 8.2* 9.1*  ALBUMIN 3.1* 3.2*  CALCIUM 7.9* 7.5*  PHOS 9.5* 10.1*   CREATININE 16.05* 12.77*  K 4.5 4.7   No results for input(s): "IRON", "TIBC", "FERRITIN" in the last 168 hours. Inpatient medications:  Chlorhexidine Gluconate Cloth  6 each Topical Q0600   Chlorhexidine Gluconate Cloth  6 each Topical Q0600   darbepoetin (ARANESP) injection - DIALYSIS  40 mcg Subcutaneous Q Sun-1800   isosorbide mononitrate  30 mg Oral Daily   OLANZapine zydis  15 mg Oral QHS   rosuvastatin  5 mg Oral Daily   sucroferric oxyhydroxide  1,000 mg Oral TID with meals    acetaminophen, benztropine, OLANZapine, OLANZapine zydis, ziprasidone

## 2023-07-13 NOTE — ED Notes (Signed)
 IVC'd 07/08/23, exp 07/15/23; IVC docs in purple zone

## 2023-07-13 NOTE — Progress Notes (Signed)
 Patient has been denied by [facility] due to no appropriate beds available. Patient meets [facility] inpatient criteria per Arsenio Loader, NP. Patient has been faxed out to the following facilities:   [facility] [address] [place] [address] [phone]  [facility] [address] [place], [place] [address] [phone]  [facility] [address] [place], [place] [address] [phone]  [facility] [address] [place] [address] [phone]  [facility] [address] [place] [address] [phone]  [facility] [address], [place] [address] [phone]  [facility] [address] [place] [address] [phone]  [facility] EFAX [address] [place], [place] [phone]  [facility] [address] [place], [place] [address] [phone]  [facility] [address] [place], [place] [address] [phone]  [facility] [address], [place] [address] [phone]    Damita Dunnings, MSW, LCSW-A  11:08 AM 07/13/2023

## 2023-07-14 DIAGNOSIS — F250 Schizoaffective disorder, bipolar type: Secondary | ICD-10-CM | POA: Diagnosis not present

## 2023-07-14 DIAGNOSIS — N186 End stage renal disease: Secondary | ICD-10-CM | POA: Diagnosis not present

## 2023-07-14 DIAGNOSIS — D631 Anemia in chronic kidney disease: Secondary | ICD-10-CM | POA: Diagnosis not present

## 2023-07-14 DIAGNOSIS — I120 Hypertensive chronic kidney disease with stage 5 chronic kidney disease or end stage renal disease: Secondary | ICD-10-CM | POA: Diagnosis not present

## 2023-07-14 DIAGNOSIS — Z992 Dependence on renal dialysis: Secondary | ICD-10-CM | POA: Diagnosis not present

## 2023-07-14 DIAGNOSIS — R45850 Homicidal ideations: Secondary | ICD-10-CM | POA: Diagnosis not present

## 2023-07-14 DIAGNOSIS — E8779 Other fluid overload: Secondary | ICD-10-CM | POA: Diagnosis not present

## 2023-07-14 DIAGNOSIS — N2581 Secondary hyperparathyroidism of renal origin: Secondary | ICD-10-CM | POA: Diagnosis not present

## 2023-07-14 MED ORDER — HEPARIN SODIUM (PORCINE) 1000 UNIT/ML DIALYSIS
1000.0000 [IU] | INTRAMUSCULAR | Status: DC | PRN
  Administered 2023-07-14: 1000 [IU]
  Filled 2023-07-14: qty 1

## 2023-07-14 MED ORDER — HEPARIN SODIUM (PORCINE) 1000 UNIT/ML DIALYSIS
1000.0000 [IU] | INTRAMUSCULAR | Status: DC | PRN
  Filled 2023-07-14: qty 1

## 2023-07-14 MED ORDER — OLANZAPINE 15 MG PO TBDP
15.0000 mg | ORAL_TABLET | Freq: Every day | ORAL | 0 refills | Status: DC
  Filled 2023-07-14: qty 30, 30d supply, fill #0

## 2023-07-14 MED ORDER — HEPARIN SODIUM (PORCINE) 1000 UNIT/ML IJ SOLN
2500.0000 [IU] | Freq: Once | INTRAMUSCULAR | Status: AC
Start: 2023-07-14 — End: 2023-07-14
  Administered 2023-07-14: 2500 [IU]

## 2023-07-14 MED ORDER — ANTICOAGULANT SODIUM CITRATE 4% (200MG/5ML) IV SOLN: 5.0000 mL | Status: DC | PRN

## 2023-07-14 MED ORDER — SODIUM CHLORIDE 0.9 % IV SOLN
500.0000 mg | INTRAVENOUS | Status: DC
  Administered 2023-07-14: 500 mg via INTRAVENOUS
  Filled 2023-07-14: qty 25

## 2023-07-14 MED ORDER — HEPARIN SODIUM (PORCINE) 1000 UNIT/ML DIALYSIS
2500.0000 [IU] | Freq: Once | INTRAMUSCULAR | Status: DC
  Filled 2023-07-14: qty 3

## 2023-07-14 MED ORDER — CALCITRIOL 0.5 MCG PO CAPS
0.5000 ug | ORAL_CAPSULE | ORAL | Status: DC
  Administered 2023-07-14: 0.5 ug via ORAL
  Filled 2023-07-14: qty 1

## 2023-07-14 MED ORDER — IRON SUCROSE 500 MG IVPB - SIMPLE MED
500.0000 mg | INTRAVENOUS | Status: DC
  Filled 2023-07-14: qty 275

## 2023-07-14 MED ORDER — PENTAFLUOROPROP-TETRAFLUOROETH EX AERO: 1.0000 | INHALATION_SPRAY | CUTANEOUS | Status: DC | PRN

## 2023-07-14 MED ORDER — ALTEPLASE 2 MG IJ SOLR: 2.0000 mg | Freq: Once | INTRAMUSCULAR | Status: DC | PRN

## 2023-07-14 NOTE — ED Notes (Signed)
 Pt ambulated to restroom independently.

## 2023-07-14 NOTE — Progress Notes (Signed)
   07/14/23 1218  Vitals  Pulse Rate 92  Resp 17  BP (!) 159/83  SpO2 96 %  O2 Device Room Air  Oxygen Therapy  Patient Activity (if Appropriate) In bed  Pulse Oximetry Type Continuous  Oximetry Probe Site Changed No  During Treatment Monitoring  Blood Flow Rate (mL/min) 0 mL/min  Arterial Pressure (mmHg) 12.52 mmHg  Venous Pressure (mmHg) -1.41 mmHg  TMP (mmHg) -51.31 mmHg  Ultrafiltration Rate (mL/min) 967 mL/min  Dialysate Flow Rate (mL/min) 0 ml/min  Dialysate Potassium Concentration 2  Dialysate Calcium Concentration 2.5  Duration of HD Treatment -hour(s) 3.5 hour(s)  Cumulative Fluid Removed (mL) per Treatment  2500.13  HD Safety Checks Performed Yes  Intra-Hemodialysis Comments Tx completed   Received patient in bed to unit.  Alert and oriented.  Informed consent signed and in chart.   TX duration: 3.5 hours  Patient tolerated well. Pt slept during the entire tx but when it was time to transfer back to ED he began to get a little agitated. Was able to keep him calm, he was not sent with security or a sitter. Transported back to the room. Alert, without acute distress. Hand-off given to patient's nurse.   Access used: RAVF Access issues: None  Total UF removed: 2500 Medication(s) given: See Wanda Plump, LPN  [facility]

## 2023-07-14 NOTE — ED Notes (Addendum)
 Paul [name]

## 2023-07-14 NOTE — Discharge Instructions (Signed)
 Outpatient psychiatric Services  Walk in hours for medication management Monday, Wednesday, Thursday, and Friday from 8:00 AM to 11:00 AM Recommend arriving by by 7:30 AM.  It is first come first serve.    Walk in hours for therapy intake Monday and Wednesday only 8:00 AM to 11:00 AM Encouraged to arrive by 7:30 AM.  It is first come first serve   Inpatient patient psychiatric services The [facility] offers comprehensive behavioral heath care services for mental health and substance abuse treatment.  Social work can also assist with referral to or getting you into a rehabilitation program short or long term  Set designer Resources:  Intensive Outpatient Programs: [employer]      [address] [place], [place] [phone] Both a day and evening program       [facility] Outpatient     [address]        [place], [place] [address] [phone]         ADS: [facility] [address] [place] [phone]  [facility] ACCESS LINE: [phone] or [phone] [address] [place], [place] [address] [email or web address]   Substance Abuse Resources: [facility] Drug Services  [phone] [facility] [phone] [facility] [phone] Floydene Flock [phone] Residential & Outpatient Substance Abuse Program  [phone]  Psychological Services: [facility]  [phone] [facility]  [phone] [facility], [phone] [place]. [address], [place], ACCESS LINE: [phone] or [phone], [email or web address]  Mobile Crisis Teams:                                        Therapeutic Alternatives         Mobile Crisis Care Unit [phone]             [facility] [address] Ginette Otto [phone]                                          Interventionist [address] [place] [phone]  Self-Help/Support Groups: Mental Health Assoc. of [employer] of support groups 917-379-9932 (call for more info)  Narcotics Anonymous (NA) Caring Services [address] [place] - 2 meetings at this location  Residential Treatment Programs:  [facility]      [address]        [place]       [phone]         [facility] [address], [place] [address]80 [place], [place]  [address] [phone]  [facility] Treatment Facility  [address] [place], [place] [address] [phone] Admissions: 8am-3pm M-F  [facility]     [address]        [place], [place] [address]       [phone]         [facility] [address] [place], [place] [phone]  [facility] [address] [place], [place] [phone]  [facility] - Intensive Outpatient      [address]     [place], [place]       [phone]  [facility] ([facility]     [address] [place], [place] [phone] or [phone]  Residential Treatment Services (RTS), Medicaid [address] [place], [place] [phone]  [facility] [address]                                               [address] [place] [phone]  [facility] Resources: [phone]               General Therapy                                                Angie Fava, PhD        [address] [place], [place] [address]         [phone]   Insurance  [facility] Behavioral   [address] [place], [place] [address] [phone]  [facility] Recovery [address] [place], [place] [address] [phone] Insurance/Medicaid/sponsorship through [facility]                                               [address]                                        [place], [place] [address]    Therapy/tele-psych/case         [phone]          [facility] [address], [place]  [address]  Adolescent/group home/case management [phone]                                           Creola Corn PhD       General therapy       Insurance   [phone]         Dr. Lolly Mustache, [place], M-F [phone]  [facility]  [employer] [facility] Dept. [address]                 [address]         [address] [place] [address]  [place]                                               Cristobal Goldmann Phone:  [phone]  Phone:  [phone]                   Phone:  [phone]  [facility], [phone] [facility]- [phone]       -     [facility] in [place], [address],             [phone], Insurance

## 2023-07-14 NOTE — ED Provider Notes (Signed)
 Emergency Medicine Observation Re-evaluation Note  Paul [name] is a 58 y.o. male, seen on rounds today.  Pt initially presented to the ED for complaints of Medical Clearance (IVC) Currently, the patient has been cleared by psychiatry and Paul [name] is going to check apartment for weapons and remove them if they are there. Recommending olazapine 50 mg PO nightly and 30 day supply sent to pharmacy by psych NP. Reports good follow-up plan in place. Denies SI/HI.   Patient with no acute complaints, states he understands his psychiatric recommendations  Physical Exam  BP 130/79 (BP Location: Left Arm)   Pulse 88   Temp 98.8 F (37.1 C) (Oral)   Resp 15   Wt 75.6 kg   SpO2 99%   BMI 29.52 kg/m  Physical Exam General: Calm, eating dinner Cardiac: well-perfused Lungs: no resp distress Psych: calm, oriented  ED Course / MDM  EKG:EKG Interpretation Date/Time:  Tuesday July 08 2023 21:01:24 EST Ventricular Rate:  99 PR Interval:  178 QRS Duration:  90 QT Interval:  378 QTC Calculation: 485 R Axis:   9  Text Interpretation: Normal sinus rhythm Septal infarct , age undetermined Abnormal ECG When compared with ECG of 13-Jun-2023 17:33, PREVIOUS ECG IS PRESENT Confirmed by Zadie Rhine ([id number]) on 07/09/2023 7:08:41 AM  I have reviewed the labs performed to date as well as medications administered while in observation.    Plan  Current plan is for discharge with outpatient follow up. IVC rescinded. DC w/ discharge instructions/return precautions. All questions answered to patient's satisfaction.      Loetta Rough, MD 07/14/23 [phone]

## 2023-07-14 NOTE — Progress Notes (Signed)
 [facility] Progress Note  Subjective:  Seen on diet Alysis with no complaints  Vitals:   07/14/23 0819 07/14/23 0830 07/14/23 0900 07/14/23 0933  BP: (!) 166/92 (!) 152/86 (!) 146/79 131/84  Pulse: 81 79 81 90  Resp: 15 15 (!) 22 18  Temp:      TempSrc:      SpO2: 100% 100% 96% 97%  Weight:        Exam: GEN: wdwn, sitting in bed, nad ENT: no nasal discharge, mmm EYES: no scleral icterus, eomi CV: normal rate PULM: no iwob, bilateral chest rise ABD: NABS, non-distended SKIN: no rashes or jaundice EXT: no edema, warm and well perfused     RUA AVF+bruit     Renal-related home meds: - coreg 6.25 bid  - imdur 30 - velphoro 1000mg  ac tid     OP HD: [facility] MWF 4h  B400  76.5kg  2K bath AVF  Heparin 2400 - last OP HD 2/28, post wt 76.8kg, getting to dry wt - rocaltrol 0.25 mcg po three times per week - mircera 30 mcg IV q 4 wks, last 2/05, due 3/05      Assessment/ Plan: Homicidal behavior - h/o bipolar disorder, see ED notes per psychiatry. Boarding in ED for now. Under IVC. Awaiting transfer to psych facility. Per ED/ psychiatry.  ESRD - pt admitted after being released from his OP HD unit for errant behavior.  Dialysis on MWF schedule here.  Told him he can no longer go to his OP HD unit for dialysis and that he should come to hospital for hospital HD 2-3x/ wk after he is dc'd home.  Volume -maintain dry weight with dialysis Anemia of esrd - ESA started here, IV iron ordered Secondary hyperparathyroidism - Ca low will start some calcitriol, obtain pth, continue binders    Paul Moselle MD  [facility] 07/14/2023, 10:04 AM  Recent Labs  Lab 07/09/23 1435 07/11/23 0746  HGB 8.2* 9.1*  ALBUMIN 3.1* 3.2*  CALCIUM 7.9* 7.5*  PHOS 9.5* 10.1*  CREATININE 16.05* 12.77*  K 4.5 4.7   No results for input(s): "IRON", "TIBC", "FERRITIN" in the last 168 hours. Inpatient medications:  Chlorhexidine Gluconate Cloth  6 each Topical Q0600   darbepoetin (ARANESP)  injection - DIALYSIS  40 mcg Subcutaneous Q Mon-1800   [START ON 07/15/2023] heparin  2,500 Units Dialysis Once in dialysis   isosorbide mononitrate  30 mg Oral Daily   OLANZapine zydis  15 mg Oral QHS   rosuvastatin  5 mg Oral Daily   sucroferric oxyhydroxide  1,000 mg Oral TID with meals    anticoagulant sodium citrate     acetaminophen, alteplase, anticoagulant sodium citrate, benztropine, heparin, [START ON 07/15/2023] heparin, OLANZapine, OLANZapine zydis, pentafluoroprop-tetrafluoroeth, ziprasidone

## 2023-07-14 NOTE — Consult Note (Signed)
 [facility] Psychiatric Consult Follow-up  Patient Name: .Paul [name]  MRN: [id number]  DOB: 08-03-1965  Consult Order details:  Orders (From admission, onward)     Start     Ordered   07/08/23 2026  CONSULT TO CALL ACT TEAM       Ordering Provider: [name], PA-C  Provider:  (Not yet assigned)  Question:  Reason for Consult?  Answer:  Psych consult   07/08/23 2025             Mode of Visit: In person    Psychiatry Consult Evaluation  Service Date: July 14, 2023 LOS: 6 days Chief Complaint: I'm ready to leave  Primary Psychiatric Diagnoses  Schizoaffective disorder, bipolar type (HCC)  Assessment   Per. Dr. Gilman Schmidt, MD  Paul [name] is a 58 year old male with a history of schizophrenia, bipolar disorder, CKD, CHF, hypertension who presents to the ED on an IVC after threatening to cut the heads off of women who work in the facility. Per chart review, patient with a history of violence. Patient also reportedly posting YouTube videos "He was calling for men to pick up Swords, kill women and feminists and anybody who supports trans rights, 'chop them up and feed them to the birds.'  Patient also has been stating that he has a calling from God to lead this nation to greatness and that he is the only person who can do it." Per chart review, has been having angry outbursts in the ED and also threatened to "put a syringe in the eye of the first psychiatrist who talks to him." Psychiatry consulted for medication management.    07/14/2023  Upon reevaluation, patient today presents meaningfully better, is not making religious or grandiose expressions, continues to deny suicidal and or homicidal ideations, and is now expressing intent and desire to follow-up with outpatient resources, continue to take medications upon discharge, and to start the process of safety planning with the individuals that are close with the patient for safe discharge.  Patient did well over the  weekend.  He remains compliant with medications, had no behavioral incidents or aggressive outburst, has not expressed any grandiose delusions or hyperreligiosity to staff.  Patient expresses good insight and improved judgment.  Patient stated he is willing to take his Zyprexa at home and understands the benefits with his improved mood and sleep.  Patient is willing to continue treatment on the outpatient basis.  He continues to deny any suicidal or homicidal ideations.  Denies auditory visual hallucinations.  Denies any intent to hurt other people and made no delusional statements during conversation.  He has a good follow-up plan in place.  He has good support such as his landlord.  Will psych cleared patient for discharge.  Diagnoses:  Active Hospital problems: Principal Problem:   Schizoaffective disorder, bipolar type (HCC)    Plan   ## Psychiatric Medication Recommendations:    olanzapine 15 mg p.o. nightly --> 30 day supply sent to preferred pharmacy  ## Medical Decision Making Capacity: Not specifically addressed in this encounter  ## Further Work-up: UDS/UA  ## Disposition:--no barriers to discharge at this time  ## Behavioral / Environmental: -Agitation/safety precautions    ## Safety and Observation Level:  - Based on my clinical evaluation, I estimate the patient to be at low risk of self harm in the current setting. - At this time, we recommend  1:1 Observation. This decision is based on my review of the chart including patient's  history and current presentation, interview of the patient, mental status examination, and consideration of suicide risk including evaluating suicidal ideation, plan, intent, suicidal or self-harm behaviors, risk factors, and protective factors. This judgment is based on our ability to directly address suicide risk, implement suicide prevention strategies, and develop a safety plan while the patient is in the clinical setting. Please contact our team if  there is a concern that risk level has changed.  CSSR Risk Category:C-SSRS RISK CATEGORY: No Risk  Suicide Risk Assessment: Patient has following modifiable risk factors for suicide: social isolation, recklessness, and medication noncompliance, access to a sword, which were addressed during a 6 day ED obs stay, plus continuing treatment in outpatient settting Patient has following non-modifiable or demographic risk factors for suicide: male gender and psychiatric hospitalization Patient has the following protective factors against suicide: Access to outpatient mental health care, Cultural, spiritual, or religious beliefs that discourage suicide, no history of suicide attempts, and no history of NSSIB  Thank you for this consult request. Recommendations have been communicated to the primary team.  We will psych clear for discharge at this time.   Eligha Bridegroom, NP     History of Present Illness   Per. Dr. Gilman Schmidt, MD  Paul [name] is a 58 year old male with a history of schizophrenia, bipolar disorder, CKD, CHF, hypertension who presents to the ED on an IVC after threatening to cut the heads off of women who work in the facility. Per chart review, patient with a history of violence. Patient also reportedly posting YouTube videos "He was calling for men to pick up Swords, kill women and feminists and anybody who supports trans rights, 'chop them up and feed them to the birds.'  Patient also has been stating that he has a calling from God to lead this nation to greatness and that he is the only person who can do it." Per chart review, has been having angry outbursts in the ED and also threatened to "put a syringe in the eye of the first psychiatrist who talks to him." Psychiatry consulted for medication management.    07/14/2023  Patient seen today at the [facility] emergency department for face-to-face psychiatric reevaluation.  Upon reevaluation, patient endorses an euthymic mood with a  much improved interpersonal style and presentation, appreciably has been attending to ADLs.  Patient endorses good sleep and eating, as well as endorses good toleration of medications that have been started, no endorsements of EPS symptomology.  Patient orientation is intact, no concerns for fluctuations in consciousness.  Patient endorses no suicidal and or homicidal ideations, denies auditory and or visual hallucinations, and objectively, does not appear to be presenting with psychotic features or paranoia.  Patient denies ideas of reference, as well as does not overtly discuss with this provider previous delusional themes of religiosity and grandiosity, continues to distance himself from these delusional themes.  Patient endorses that he feels that he is ready to discharge.  School times patient made the statement of "I don't want to hurt anyone. I really am a peaceful guy and I never meant to cause any harm."  Patient's insight and judgment has improved, he expresses understanding of Zyprexa and the importance of medication compliance.  Patient stated he has felt improved mood and better sleep since starting Zyprexa and has every intention of remaining on this medication.  Patient is wishing to discharge today. He denies any firearms in the home, but does endorse having a sword he recently purchased.  Pt stated it is okay for his landlord to confiscate the sword prior to d/c. Patient has shown significant improvements throughout his 6 days at the [facility], ED. He has Been compliant with medications and psychiatry has been able to titrate medications to an appropriate dose. While future psychiatric events cannot be accurately predicted, the patient does not currently require further acute psychiatric care and does not currently meet [facility] involuntary commitment criteria. It is recommended that the patient continue treatment in outpatient care. A follow up plan and crisis plan are in place, have been  discussed with the patient, and the patient agrees to the plan at time of discharge.     Collateral, Mr. Merrilyn Puma, spoken to at [phone], patient's landlord  Call placed and extensive conversation held with the patient's landlord, Mr. Wallace Cullens.  Mr. Wallace Cullens had spoken with Aurther Loft, NP 2 days ago about patient possibly discharging today and making sure the environment is safe.  Mr. Wallace Cullens stated he got home from traveling yesterday and forgot to check his room.  He is going to the house within the next hour to go through his room and check for any weapons or firearms.  Specifically Aurther Loft had mentioned looking for a sword the patient claimed to have.  Instructed Mr. Wallace Cullens that if he finds any knives, Swords, or firearms in the room to confiscate them in which Mr. Wallace Cullens agreed.  Mr. Wallace Cullens stated the patient is "an odd guy" but he has lived in the home for years and they have not had much trouble from him.  He will encourage the patient to take his medications.  He feels comfortable with the patient returning home at this time especially with his improvements.  He has no safety concerns with discharge.  Spoke with Mr. Wallace Cullens about other community resources that can be utilized for mental health such as the [facility] behavior health urgent care.  Informed him there will be several resources listed in the patient's paperwork.  Mr. Wallace Cullens expressed gratitude to the patient be brought home in a cab since he is unable to pick him up.  Review of Systems  Psychiatric/Behavioral:  Negative for depression, hallucinations (States none since "january 22nd, 2024"), substance abuse and suicidal ideas. The patient is not nervous/anxious and does not have insomnia.   All other systems reviewed and are negative.    Psychiatric and Social History  Psychiatric History:  Information collected from chart review/patient/IVC paperwork  Prev Dx/Sx: Schizophrenia, schizoaffective disorder, bipolar Current Psych Provider:  Unknown Home Meds (current): Unknown Previous Med Trials: Haldol, Paxil Therapy: None endorsed  Prior Psych Hospitalization: Yes, per patient Prior Self Harm: Unknown Prior Violence: Yes, per chart review, history of violence, history of endorsing needing to kill others  Family Psych History: Unknown Family Hx suicide: Unknown  Social History:  Developmental Hx: Unknown Educational Hx: Unknown Occupational Hx: Unknown Legal Hx: Involuntary commitment Living Situation: Lives in a halfway house Spiritual Hx: Christian  Access to weapons/lethal means: Reportedly has a sword recently purchased; patient  affirms he has a sword that he bought recently [name] to being told by, "the Lord"  Substance History  Alcohol: None endorsed or reported Type of alcohol : None endorsed or reported Last Drink : None endorsed or reported Number of drinks per day : None endorsed or reported History of alcohol withdrawal seizures : None endorsed or reported History of DT's : None endorsed or reported Tobacco: : Reportedly smoked 30 years ago, smoked for 1 year  Illicit drugs: : None endorsed or reported Prescription drug abuse: : None endorsed or reported Rehab hx: : None endorsed or reported  Exam Findings  Physical Exam: As below Vital Signs:  Temp:  [97.5 F (36.4 C)-98.2 F (36.8 C)] 97.9 F (36.6 C) (03/10 1211) Pulse Rate:  [78-106] 92 (03/10 1218) Resp:  [13-22] 17 (03/10 1218) BP: (131-178)/(79-99) 159/83 (03/10 1218) SpO2:  [96 %-100 %] 96 % (03/10 1218) Weight:  [71.3 kg-75.6 kg] 75.6 kg (03/10 0801) Blood pressure (!) 159/83, pulse 92, temperature 97.9 F (36.6 C), resp. rate 17, weight 75.6 kg, SpO2 96%. Body mass index is 29.52 kg/m.  Physical Exam Vitals and nursing note reviewed.  Constitutional:      General: He is not in acute distress.    Appearance: He is obese. He is not ill-appearing, toxic-appearing or diaphoretic.  Pulmonary:     Effort: Pulmonary effort is normal.   Skin:    General: Skin is warm and dry.  Neurological:     Mental Status: He is alert and oriented to person, place, and time.     Motor: No tremor or seizure activity.     Comments: Denies EPS symptomology, and appreciably upon physical exam, does not present with evidence of dystonia and/or akathisia.  Psychiatric:        Attention and Perception: Attention normal. He is attentive. He does not perceive auditory or visual hallucinations.        Mood and Affect: Mood and affect normal.        Speech: Speech normal.        Behavior: Behavior is cooperative.        Thought Content: Thought content normal. Thought content is not paranoid or delusional. Thought content does not include homicidal or suicidal ideation.        Cognition and Memory: Cognition and memory normal.     Mental Status Exam: General Appearance:fairly groomed, improved hygiene  Orientation:  Full (Time, Place, and Person)  Memory:   Variable, largely intact  Concentration:  Concentration: Fair and Attention Span: Fair  Recall:  Fair  Attention  Fair  Eye Contact: good  Speech: Clear and coherent  Language:  Fair  Volume:  Normal  Mood:  euthymic  Affect: Largely neutral  Thought Process: Largely goal-directed and linear   Thought Content: Logical goal directed  Suicidal Thoughts:  No  Homicidal Thoughts:  No  Judgement: Improved, fair  Insight: Improved, good  Psychomotor Activity:  Normal  Akathisia:  No  Fund of Knowledge:  Fair      Assets:  Health and safety inspector Housing Leisure Time Physical Health Resilience  Cognition:  WNL  ADL's:  Intact  AIMS (if indicated):   0     Other History   These have been pulled in through the EMR, reviewed, and updated if appropriate.  Family History:  The patient's family history includes Hypertension in his father; Stroke in his father.  Medical History: Past Medical History:  Diagnosis Date   Bipolar disorder (HCC)    Central retinal artery  occlusion    with macular infarction of the right eye. status post posterior vitrectomy and photocoagulation with insertion of a shunt in 2006.   CHF (congestive heart failure) (HCC)    chronic mixed syst/diast. 02/2009 echo: Systolic function was mildly reduced. The estimated ejection fraction was in 45%, global HK, Grade I Diast dysfxn.   Chronic kidney disease    ESRD   Heart failure    Obesity  Osteoarthritis    s/p right hip replacement in 2001   Schizophrenia (HCC)    Severe uncontrolled hypertension     Surgical History: Past Surgical History:  Procedure Laterality Date   AV FISTULA PLACEMENT Right 07/08/2022   Procedure: RIGHT RADIOCEPHALIC ARTERIOVENOUS (AV) FISTULA CREATION;  Surgeon: Victorino Sparrow, MD;  Location: [facility] OR;  Service: Vascular;  Laterality: Right;   IR FLUORO GUIDE CV LINE RIGHT  05/13/2022   IR US GUIDE VASC ACCESS RIGHT  05/13/2022   JOINT REPLACEMENT     right hip replacement   right eye surgery     for glaucoma   TOTAL HIP ARTHROPLASTY Bilateral 1997, 2001     Medications:   Current Facility-Administered Medications:    acetaminophen (TYLENOL) tablet 650 mg, 650 mg, Oral, Q6H PRN, Arabella Merles, PA-C   benztropine (COGENTIN) tablet 1 mg, 1 mg, Oral, BID PRN, Adria Dill, MD   calcitRIOL (ROCALTROL) capsule 0.5 mcg, 0.5 mcg, Oral, Q M,W,F, [name], MD, 0.5 mcg at 07/14/23 1022   Chlorhexidine Gluconate Cloth 2 % PADS 6 each, 6 each, Topical, Q0600, Delano Metz, MD   Darbepoetin Alfa (ARANESP) injection 40 mcg, 40 mcg, Subcutaneous, Q Mon-1800, Francena Hanly, RPH   iron sucrose (VENOFER) 500 mg in sodium chloride 0.9 % 250 mL IVPB, 500 mg, Intravenous, Q24H, [name], MD, Last Rate: 78.6 mL/hr at 07/14/23 1102, 500 mg at 07/14/23 1102   isosorbide mononitrate (IMDUR) 24 hr tablet 30 mg, 30 mg, Oral, Daily, Truddie Hidden, Amanda, PA-C, 30 mg at 07/13/23 1240   OLANZapine (ZYPREXA) injection 10 mg, 10 mg, Intramuscular, Once  PRN, Gilman Schmidt, Cloretta Ned, MD   OLANZapine zydis (ZYPREXA) disintegrating tablet 10 mg, 10 mg, Oral, BID PRN, Adria Dill, MD, 10 mg at 07/13/23 1240   OLANZapine zydis (ZYPREXA) disintegrating tablet 15 mg, 15 mg, Oral, QHS, Lenox Ponds, NP, 15 mg at 07/13/23 2142   rosuvastatin (CRESTOR) tablet 5 mg, 5 mg, Oral, Daily, Truddie Hidden, Amanda, PA-C, 5 mg at 07/13/23 1240   sucroferric oxyhydroxide (VELPHORO) chewable tablet 1,000 mg, 1,000 mg, Oral, TID with meals, Arabella Merles, PA-C, 1,000 mg at 07/14/23 0746   ziprasidone (GEODON) injection 20 mg, 20 mg, Intramuscular, Once PRN, [name], PA-C  Current Outpatient Medications:    isosorbide mononitrate (IMDUR) 30 MG 24 hr tablet, Take 1 tablet (30 mg total) by mouth daily., Disp: 90 tablet, Rfl: 3   rosuvastatin (CRESTOR) 5 MG tablet, Take 1 tablet (5 mg total) by mouth daily., Disp: 90 tablet, Rfl: 3   sucroferric oxyhydroxide (VELPHORO) 500 MG chewable tablet, Chew 2 tablets (1,000 mg total) by mouth with breakfast, with lunch, and with evening meal., Disp: 180 tablet, Rfl: 11   carvedilol (COREG) 6.25 MG tablet, Take 1 tablet (6.25 mg total) by mouth 2 (two) times daily with a meal. (Patient not taking: Reported on 07/08/2023), Disp: 60 tablet, Rfl: 12   isosorbide mononitrate (IMDUR) 60 MG 24 hr tablet, Take 0.5 tablets (30 mg total) by mouth daily. (Patient not taking: Reported on 07/08/2023), Disp: 15 tablet, Rfl: 0   ketoconazole (NIZORAL) 2 % cream, Apply 1 Application topically daily. (Patient not taking: Reported on 07/08/2023), Disp: 60 g, Rfl: 2   lidocaine-prilocaine (EMLA) cream, Apply 1 a small amount to skin three times a week apply to AVF 30-60 min prior to dialysis, wrap site w plastic wrap (Patient not taking: Reported on 07/08/2023), Disp: 30 g, Rfl: 11   OLANZapine zydis (ZYPREXA) 15  MG disintegrating tablet, Take 1 tablet (15 mg total) by mouth at bedtime., Disp: 30 tablet, Rfl: 0   polyethylene glycol powder  (GLYCOLAX/MIRALAX) 17 GM/SCOOP powder, Take 17 g by mouth daily. (Patient not taking: Reported on 07/08/2023), Disp: 238 g, Rfl: 0   rosuvastatin (CRESTOR) 5 MG tablet, Take 1 tablet by mouth once a day (Patient not taking: Reported on 07/08/2023), Disp: 30 tablet, Rfl: 11   sucroferric oxyhydroxide (VELPHORO) 500 MG chewable tablet, Chew 3 tablets (1,500 mg total) by mouth 3 (three) times daily with meals. (Patient not taking: Reported on 07/08/2023), Disp: 810 tablet, Rfl: 3  Allergies: Allergies  Allergen Reactions   Penicillin Harle Stanford, NP

## 2023-07-14 NOTE — ED Notes (Signed)
 IVC CASE NUMBER RETREIVE FROM COURT OF CLERK AND PLACED IN THE CHART [phone]

## 2023-07-14 NOTE — Procedures (Signed)
 I was present at this dialysis session. I have reviewed the session itself and made appropriate changes.   Filed Weights   07/11/23 1135 07/14/23 0720 07/14/23 0801  Weight: 71.3 kg (P) 71.3 kg 75.6 kg    Recent Labs  Lab 07/11/23 0746  NA 134*  K 4.7  CL 97*  CO2 23  GLUCOSE 89  BUN 84*  CREATININE 12.77*  CALCIUM 7.5*  PHOS 10.1*    Recent Labs  Lab 07/08/23 2037 07/09/23 1435 07/11/23 0746  WBC 10.5 6.5 7.2  NEUTROABS 6.8  --   --   HGB 9.3* 8.2* 9.1*  HCT 26.7* 23.6* 26.2*  MCV 98.9 98.3 98.9  PLT 247 208 209    Scheduled Meds:  Chlorhexidine Gluconate Cloth  6 each Topical Q0600   darbepoetin (ARANESP) injection - DIALYSIS  40 mcg Subcutaneous Q Mon-1800   [START ON 07/15/2023] heparin  2,500 Units Dialysis Once in dialysis   isosorbide mononitrate  30 mg Oral Daily   OLANZapine zydis  15 mg Oral QHS   rosuvastatin  5 mg Oral Daily   sucroferric oxyhydroxide  1,000 mg Oral TID with meals   Continuous Infusions:  anticoagulant sodium citrate     PRN Meds:.acetaminophen, alteplase, anticoagulant sodium citrate, benztropine, heparin, [START ON 07/15/2023] heparin, OLANZapine, OLANZapine zydis, pentafluoroprop-tetrafluoroeth, ziprasidone   Louie Bun,  MD 07/14/2023, 10:03 AM

## 2023-07-15 ENCOUNTER — Other Ambulatory Visit (HOSPITAL_COMMUNITY): Payer: Self-pay

## 2023-07-15 NOTE — Progress Notes (Deleted)
 Late Note Entry- July 15, 2023  Noted this morning that pt was d/c to home late yesterday. Pt currently does not have an accepting out-pt HD clinic. Pt was d/c from his clinic due to behaviors. Since pt deemed stable by psychiatry, can attempt referrals to [facility] and [facility] once Hep B total core antibody lab available. Contacted nephrologist to request that lab be ordered next time pt comes to ED for HD so referrals can be attempted. It is possible that pt may not be accepted at any local HD clinics due to pt's recent behaviors at local clinic but can attempt referrals.   Randine Mungo Renal Navigator [phone]

## 2023-07-15 NOTE — Progress Notes (Signed)
 Late Note Entry- July 15, 2023  Noted this morning that pt was d/c to home late yesterday. Pt currently does not have an accepting out-pt HD clinic. Pt was d/c from his clinic due to behaviors. Since pt deemed stable by psychiatry, can attempt referrals to [facility] and [facility] once Hep B total core antibody lab available (both require this lab). Contacted nephrologist to request that lab be ordered the next time pt comes to ED for HD so referrals can be attempted. It is possible that pt may not be accepted at any local HD clinic due to pt's recent behaviors as local clinic but referrals can be attempted.   Olivia Canter Renal Navigator [phone]

## 2023-07-16 ENCOUNTER — Other Ambulatory Visit (HOSPITAL_COMMUNITY): Payer: Self-pay

## 2023-07-16 ENCOUNTER — Other Ambulatory Visit: Payer: Self-pay

## 2023-07-16 ENCOUNTER — Encounter (HOSPITAL_COMMUNITY): Payer: Self-pay

## 2023-07-16 ENCOUNTER — Emergency Department (HOSPITAL_COMMUNITY)
Admission: EM | Admit: 2023-07-16 | Discharge: 2023-07-16 | Disposition: A | Discharge status: Home / Self Care | Attending: Emergency Medicine | Admitting: Emergency Medicine

## 2023-07-16 DIAGNOSIS — I509 Heart failure, unspecified: Secondary | ICD-10-CM | POA: Insufficient documentation

## 2023-07-16 DIAGNOSIS — Z992 Dependence on renal dialysis: Secondary | ICD-10-CM | POA: Diagnosis not present

## 2023-07-16 DIAGNOSIS — N186 End stage renal disease: Secondary | ICD-10-CM | POA: Diagnosis not present

## 2023-07-16 DIAGNOSIS — H5440 Blindness, one eye, unspecified eye: Secondary | ICD-10-CM | POA: Diagnosis not present

## 2023-07-16 DIAGNOSIS — I132 Hypertensive heart and chronic kidney disease with heart failure and with stage 5 chronic kidney disease, or end stage renal disease: Secondary | ICD-10-CM | POA: Diagnosis present

## 2023-07-16 DIAGNOSIS — I120 Hypertensive chronic kidney disease with stage 5 chronic kidney disease or end stage renal disease: Secondary | ICD-10-CM | POA: Diagnosis not present

## 2023-07-16 DIAGNOSIS — Z79899 Other long term (current) drug therapy: Secondary | ICD-10-CM | POA: Insufficient documentation

## 2023-07-16 LAB — PARATHYROID HORMONE, INTACT (NO CA): PTH: 92 pg/mL — ABNORMAL HIGH (ref 15–65)

## 2023-07-16 LAB — CBC
HCT: 24.6 % — ABNORMAL LOW (ref 39.0–52.0)
Hemoglobin: 8.5 g/dL — ABNORMAL LOW (ref 13.0–17.0)
MCH: 33.9 pg (ref 26.0–34.0)
MCHC: 34.6 g/dL (ref 30.0–36.0)
MCV: 98 fL (ref 80.0–100.0)
Platelets: 219 10*3/uL (ref 150–400)
RBC: 2.51 MIL/uL — ABNORMAL LOW (ref 4.22–5.81)
RDW: 14.6 % (ref 11.5–15.5)
WBC: 7.5 10*3/uL (ref 4.0–10.5)
nRBC: 0 % (ref 0.0–0.2)

## 2023-07-16 LAB — RENAL FUNCTION PANEL
Albumin: 3.5 g/dL (ref 3.5–5.0)
Anion gap: 16 — ABNORMAL HIGH (ref 5–15)
BUN: 93 mg/dL — ABNORMAL HIGH (ref 6–20)
CO2: 22 mmol/L (ref 22–32)
Calcium: 8.5 mg/dL — ABNORMAL LOW (ref 8.9–10.3)
Chloride: 100 mmol/L (ref 98–111)
Creatinine, Ser: 12.98 mg/dL — ABNORMAL HIGH (ref 0.61–1.24)
GFR, Estimated: 4 mL/min — ABNORMAL LOW (ref >60)
Glucose, Bld: 130 mg/dL — ABNORMAL HIGH (ref 70–99)
Phosphorus: 8.7 mg/dL — ABNORMAL HIGH (ref 2.5–4.6)
Potassium: 4.7 mmol/L (ref 3.5–5.1)
Sodium: 138 mmol/L (ref 135–145)

## 2023-07-16 MED ORDER — CHLORHEXIDINE GLUCONATE CLOTH 2 % EX PADS
6.0000 | MEDICATED_PAD | Freq: Every day | CUTANEOUS | Status: DC
Start: 2023-07-16 — End: 2023-07-16

## 2023-07-16 NOTE — Progress Notes (Signed)
   07/16/23 1723  Vitals  Temp 97.7 F (36.5 C)  Pulse Rate (!) 101  Resp 19  BP (!) 145/91  SpO2 99 %  O2 Device Room Air  Post Treatment  Dialyzer Clearance Lightly streaked  Hemodialysis Intake (mL) 0 mL  Liters Processed 86  Fluid Removed (mL) 2500 mL  Tolerated HD Treatment Yes  AVG/AVF Arterial Site Held (minutes) 10 minutes  AVG/AVF Venous Site Held (minutes) 8 minutes   Received patient in bed to unit.  Alert and oriented.  Informed consent signed and in chart.   TX duration:3.5hrs  Patient tolerated well.  Transported back to the room  Alert, without acute distress.  Hand-off given to patient's nurse.   Access used: RAVF Access issues: none  Total UF removed: 2L Medication(s) given: none    [facility]

## 2023-07-16 NOTE — ED Provider Notes (Signed)
 [place] EMERGENCY DEPARTMENT AT [facility] Provider Note   CSN: [id number] Arrival date & time: 07/16/23  9563     History  Chief Complaint  Patient presents with   Vascular Access Problem    Paul [name] is a 58 y.o. male.  Pt is a 58 yo male with pmhx significant for ESRD on HD (MWF), HTN, CRAO (r eye), CHF, bipolar d/o, and schizophrenia.  Pt was IVC'd on 3/4 for threatening to cut the heads off the women in the facility and had been posting for men to pick up their swords and kill women, feminists, and anyone who supports trans rights.  Pt was d/c from the ED on 3/10 after getting dialysis.  Pt presented to his old dialysis center today and was told to come here for dialysis.  Pt said he does not know why they would have dismissed him.  He denies any sob.         Home Medications Prior to Admission medications   Medication Sig Start Date End Date Taking? Authorizing Provider  carvedilol (COREG) 6.25 MG tablet Take 1 tablet (6.25 mg total) by mouth 2 (two) times daily with a meal. Patient not taking: Reported on 07/08/2023 06/15/22 06/22/23  Estanislado Emms, MD  isosorbide mononitrate (IMDUR) 30 MG 24 hr tablet Take 1 tablet (30 mg total) by mouth daily. 05/16/23     isosorbide mononitrate (IMDUR) 60 MG 24 hr tablet Take 0.5 tablets (30 mg total) by mouth daily. Patient not taking: Reported on 07/08/2023 05/09/23   Marcine Matar, MD  ketoconazole (NIZORAL) 2 % cream Apply 1 Application topically daily. Patient not taking: Reported on 07/08/2023 07/08/23   Edwin Cap, DPM  lidocaine-prilocaine (EMLA) cream Apply 1 a small amount to skin three times a week apply to AVF 30-60 min prior to dialysis, wrap site w plastic wrap Patient not taking: Reported on 07/08/2023 09/12/22     OLANZapine zydis (ZYPREXA) 15 MG disintegrating tablet Take 1 tablet (15 mg total) by mouth at bedtime. 07/14/23   Eligha Bridegroom, NP  polyethylene glycol powder (GLYCOLAX/MIRALAX) 17 GM/SCOOP  powder Take 17 g by mouth daily. Patient not taking: Reported on 07/08/2023 01/29/23   Hartley Barefoot A, MD  rosuvastatin (CRESTOR) 5 MG tablet Take 1 tablet by mouth once a day Patient not taking: Reported on 07/08/2023 11/21/22     rosuvastatin (CRESTOR) 5 MG tablet Take 1 tablet (5 mg total) by mouth daily. 03/24/23     sucroferric oxyhydroxide (VELPHORO) 500 MG chewable tablet Chew 2 tablets (1,000 mg total) by mouth with breakfast, with lunch, and with evening meal. 08/08/22     sucroferric oxyhydroxide (VELPHORO) 500 MG chewable tablet Chew 3 tablets (1,500 mg total) by mouth 3 (three) times daily with meals. Patient not taking: Reported on 07/08/2023 04/02/23         Allergies    Penicillin g    Review of Systems   Review of Systems  All other systems reviewed and are negative.   Physical Exam Updated Vital Signs BP (!) 173/90 (BP Location: Right Arm)   Pulse 99   Temp 97.9 F (36.6 C) (Oral)   Resp 15   Ht 5\' 3"  (1.6 m)   Wt 75.6 kg   SpO2 100%   BMI 29.52 kg/m  Physical Exam Vitals and nursing note reviewed.  Constitutional:      Appearance: Normal appearance.  HENT:     Head: Normocephalic and atraumatic.  Right Ear: External ear normal.     Left Ear: External ear normal.     Nose: Nose normal.     Mouth/Throat:     Mouth: Mucous membranes are moist.     Pharynx: Oropharynx is clear.  Eyes:     Comments: Right eye blind  Cardiovascular:     Rate and Rhythm: Normal rate and regular rhythm.     Pulses: Normal pulses.     Heart sounds: Normal heart sounds.  Pulmonary:     Effort: Pulmonary effort is normal.     Breath sounds: Normal breath sounds.  Abdominal:     General: Abdomen is flat.     Palpations: Abdomen is soft.  Musculoskeletal:        General: Normal range of motion.     Cervical back: Normal range of motion and neck supple.  Skin:    General: Skin is warm.     Capillary Refill: Capillary refill takes less than 2 seconds.  Neurological:      General: No focal deficit present.     Mental Status: He is alert and oriented to person, place, and time.  Psychiatric:        Mood and Affect: Mood normal.        Behavior: Behavior normal.     ED Results / Procedures / Treatments   Labs (all labs ordered are listed, but only abnormal results are displayed) Labs Reviewed - No data to display   EKG None  Radiology No results found.  Procedures Procedures    Medications Ordered in ED Medications - No data to display  ED Course/ Medical Decision Making/ A&P                                 Medical Decision Making  This patient presents to the ED for concern of dialysis, this involves an extensive number of treatment options, and is a complaint that carries with it a high risk of complications and morbidity.  The differential diagnosis includes needs dialysis   Co morbidities that complicate the patient evaluation  ESRD on HD (MWF), HTN, CRAO (r eye), CHF, bipolar d/o, and schizophrenia   Additional history obtained:  Additional history obtained from epic chart review  Medicines ordered and prescription drug management:  I have reviewed the patients home medicines and have made adjustments as needed   Consultations Obtained:  I requested consultation with the nephrologist (Dr. Valentino Nose),  and discussed lab and imaging findings as well as pertinent plan -he will put pt on the list for dialysis   Problem List / ED Course:  ESRD on HD:  pt has been dismissed from his outpatient unit for behavior.  Pt is on the list for dialysis from ED.  Social Determinants of Health:  Lives at home   Dispostion:  After consideration of the diagnostic results and the patients response to treatment, I feel that the patent would benefit from dialysis then likely d/c.          Final Clinical Impression(s) / ED Diagnoses Final diagnoses:  ESRD (end stage renal disease) on dialysis [facility])    Rx / DC Orders ED  Discharge Orders     None         Jacalyn Lefevre, MD 07/16/23 1008

## 2023-07-16 NOTE — ED Triage Notes (Signed)
 Pt coming in for dialysis pt reports he has no idea why his dialysis center will not see him they sent him here.

## 2023-07-16 NOTE — Progress Notes (Signed)
 [facility] Brief Note  Paul [name] is 58 year old man with ESRD on HD. He has been discharged from his outpatient dialysis clinic due to behavioral concerns and now presents to the ED for dialysis. He was was just released from [facility] with plans to continue outpatient psychiatric treatment. Last dialysis was 3/10.   -Plan for dialysis today then return to the ED.    Megan Mans Rajeev Escue PA-C 07/16/2023 10:24 AM

## 2023-07-18 ENCOUNTER — Telehealth: Payer: Self-pay | Admitting: *Deleted

## 2023-07-18 ENCOUNTER — Encounter (HOSPITAL_COMMUNITY): Payer: Self-pay

## 2023-07-18 ENCOUNTER — Other Ambulatory Visit: Payer: Self-pay

## 2023-07-18 ENCOUNTER — Emergency Department (HOSPITAL_COMMUNITY)
Admission: EM | Admit: 2023-07-18 | Discharge: 2023-07-18 | Disposition: A | Discharge status: Home / Self Care | Attending: Emergency Medicine | Admitting: Emergency Medicine

## 2023-07-18 DIAGNOSIS — I132 Hypertensive heart and chronic kidney disease with heart failure and with stage 5 chronic kidney disease, or end stage renal disease: Secondary | ICD-10-CM | POA: Diagnosis not present

## 2023-07-18 DIAGNOSIS — Z992 Dependence on renal dialysis: Secondary | ICD-10-CM | POA: Diagnosis not present

## 2023-07-18 DIAGNOSIS — I509 Heart failure, unspecified: Secondary | ICD-10-CM | POA: Diagnosis not present

## 2023-07-18 DIAGNOSIS — N186 End stage renal disease: Secondary | ICD-10-CM | POA: Insufficient documentation

## 2023-07-18 DIAGNOSIS — I120 Hypertensive chronic kidney disease with stage 5 chronic kidney disease or end stage renal disease: Secondary | ICD-10-CM | POA: Diagnosis not present

## 2023-07-18 MED ORDER — CHLORHEXIDINE GLUCONATE CLOTH 2 % EX PADS: 6.0000 | MEDICATED_PAD | Freq: Every day | CUTANEOUS | Status: DC

## 2023-07-18 NOTE — ED Notes (Signed)
 Pt advised staff he is going to leave and get dialysis another day.

## 2023-07-18 NOTE — Progress Notes (Signed)
 Complex Care Management Care Guide Note  07/18/2023 Name: KAMAREON SCIANDRA MRN: [id number] DOB: 02-07-1966  Elnoria Howard Bennie is a 58 y.o. year old male who is a primary care patient of Marcine Matar, MD and is actively engaged with the care management team. I reached out to Leticia Clas by phone today to assist with scheduling  with the RN Case Manager BSW.  Follow up plan: Telephone appointment with complex care management team member scheduled for:  3/18 RNCM and 3/19 SW  Gwenevere Ghazi  [facility]  [facility] Guide  Direct Dial: [phone]  Fax [phone]

## 2023-07-18 NOTE — ED Triage Notes (Signed)
 Pt to ED for dialysis. Pt's last dialysis was Wednesday (2 days ago). Pt states he was told he had to come to ED for dialysis because the dialysis center would no longer see him. Pt denies any complaints.

## 2023-07-18 NOTE — ED Provider Notes (Addendum)
 [place] EMERGENCY DEPARTMENT AT [facility] Provider Note   CSN: [id number] Arrival date & time: 07/18/23  0715     History  Chief Complaint  Patient presents with   needs dialysis    Paul [name] is a 58 y.o. male with PMHx ESRD on HD (MWF), HTN, CRAO (r eye), CHF, bipolar d/o, schizophrenia who presents to ED for dialysis. Last session of dialysis 2 days ago. Patient denies any infectious symptoms today and states that he feels good.  HPI     Home Medications Prior to Admission medications   Medication Sig Start Date End Date Taking? Authorizing Provider  carvedilol (COREG) 6.25 MG tablet Take 1 tablet (6.25 mg total) by mouth 2 (two) times daily with a meal. Patient not taking: Reported on 07/08/2023 06/15/22 06/22/23  Estanislado Emms, MD  isosorbide mononitrate (IMDUR) 30 MG 24 hr tablet Take 1 tablet (30 mg total) by mouth daily. 05/16/23     isosorbide mononitrate (IMDUR) 60 MG 24 hr tablet Take 0.5 tablets (30 mg total) by mouth daily. Patient not taking: Reported on 07/08/2023 05/09/23   Marcine Matar, MD  ketoconazole (NIZORAL) 2 % cream Apply 1 Application topically daily. Patient not taking: Reported on 07/08/2023 07/08/23   Edwin Cap, DPM  lidocaine-prilocaine (EMLA) cream Apply 1 a small amount to skin three times a week apply to AVF 30-60 min prior to dialysis, wrap site w plastic wrap Patient not taking: Reported on 07/08/2023 09/12/22     OLANZapine zydis (ZYPREXA) 15 MG disintegrating tablet Take 1 tablet (15 mg total) by mouth at bedtime. 07/14/23   Eligha Bridegroom, NP  polyethylene glycol powder (GLYCOLAX/MIRALAX) 17 GM/SCOOP powder Take 17 g by mouth daily. Patient not taking: Reported on 07/08/2023 01/29/23   Hartley Barefoot A, MD  rosuvastatin (CRESTOR) 5 MG tablet Take 1 tablet by mouth once a day Patient not taking: Reported on 07/08/2023 11/21/22     rosuvastatin (CRESTOR) 5 MG tablet Take 1 tablet (5 mg total) by mouth daily. 03/24/23      sucroferric oxyhydroxide (VELPHORO) 500 MG chewable tablet Chew 2 tablets (1,000 mg total) by mouth with breakfast, with lunch, and with evening meal. 08/08/22     sucroferric oxyhydroxide (VELPHORO) 500 MG chewable tablet Chew 3 tablets (1,500 mg total) by mouth 3 (three) times daily with meals. Patient not taking: Reported on 07/08/2023 04/02/23         Allergies    Penicillin g    Review of Systems   Review of Systems  Constitutional:        Dialysis    Physical Exam Updated Vital Signs BP (!) 158/86 (BP Location: Left Arm)   Pulse 94   Temp 97.8 F (36.6 C)   Resp 18   Ht 5\' 3"  (1.6 m)   Wt 76 kg   SpO2 100%   BMI 29.68 kg/m  Physical Exam Vitals and nursing note reviewed.  Constitutional:      General: He is not in acute distress.    Appearance: He is not ill-appearing or toxic-appearing.  HENT:     Head: Normocephalic and atraumatic.     Mouth/Throat:     Mouth: Mucous membranes are moist.  Eyes:     General: No scleral icterus.       Right eye: No discharge.        Left eye: No discharge.     Conjunctiva/sclera: Conjunctivae normal.  Cardiovascular:     Rate and Rhythm:  Normal rate and regular rhythm.     Pulses: Normal pulses.     Heart sounds: Normal heart sounds. No murmur heard. Pulmonary:     Effort: Pulmonary effort is normal. No respiratory distress.     Breath sounds: Normal breath sounds. No wheezing, rhonchi or rales.  Abdominal:     General: Abdomen is flat. Bowel sounds are normal.     Palpations: Abdomen is soft.     Tenderness: There is no abdominal tenderness.  Musculoskeletal:     Right lower leg: No edema.     Left lower leg: No edema.  Skin:    General: Skin is warm and dry.     Findings: No rash.  Neurological:     General: No focal deficit present.     Mental Status: He is alert and oriented to person, place, and time. Mental status is at baseline.  Psychiatric:        Mood and Affect: Mood normal.     ED Results / Procedures /  Treatments   Labs (all labs ordered are listed, but only abnormal results are displayed) Labs Reviewed - No data to display  EKG EKG Interpretation Date/Time:  Friday July 18 2023 07:22:53 EDT Ventricular Rate:  94 PR Interval:  172 QRS Duration:  90 QT Interval:  382 QTC Calculation: 477 R Axis:   -25  Text Interpretation: Normal sinus rhythm When compared with ECG of 08-Jul-2023 21:01, PREVIOUS ECG IS PRESENT Confirmed by Alvester Chou [id number] on 07/18/2023 7:26:17 AM  Radiology No results found.  Procedures Procedures    Medications Ordered in ED Medications - No data to display  ED Course/ Medical Decision Making/ A&P                                 Medical Decision Making Risk Decision regarding hospitalization.   This patient presents to the ED for concern of dialysis, this involves an extensive number of treatment options, and is a complaint that carries with it a high risk of complications and morbidity.  The differential diagnosis includes electrolyte abnormality, toxicity, sepsis   Co morbidities that complicate the patient evaluation  ESRD on HD (MWF), HTN, CRAO (r eye), CHF, bipolar d/o, schizophrenia    Additional history obtained:  Last dialysis session 2 days ago    Cardiac Monitoring: / EKG:  The patient was maintained on a cardiac monitor.  I personally viewed and interpreted the cardiac monitored which showed an underlying rhythm of: normal sinus rhythm   Problem List / ED Course / Critical interventions / Medication management  Patient presents to ED for his dialysis. Last dialysis 2 days ago. Patient denies any infectious symptoms today. Physical exam unremarkable. Patient afebrile with stable vitals.  Consulted with Nephrologist on-call, Dr. Valentino Nose who agrees to see patient for dialysis and will order the labs that he will need. I appreciate his help.   As long as dialysis session runs smoothly and patient still feels healthy, he  should be eligible for discharge after his dialysis session. I have reviewed the patients home medicines and have made adjustments as needed Patient ended up leaving before he went to dialysis stating that he needed to come back another day.   Social Determinants of Health:  none         Final Clinical Impression(s) / ED Diagnoses Final diagnoses:  ESRD (end stage renal disease) on dialysis (HCC)  Rx / DC Orders ED Discharge Orders     None         Margarita Rana 07/18/23 1610    Terald Sleeper, MD 07/18/23 [address] [place], [place] 07/18/23 1032    Terald Sleeper, MD 07/18/23 1752

## 2023-07-21 ENCOUNTER — Other Ambulatory Visit: Payer: Self-pay

## 2023-07-21 ENCOUNTER — Emergency Department (HOSPITAL_COMMUNITY)
Admission: EM | Admit: 2023-07-21 | Discharge: 2023-07-21 | Disposition: A | Discharge status: Home / Self Care | Source: Home / Self Care | Attending: Emergency Medicine | Admitting: Emergency Medicine

## 2023-07-21 DIAGNOSIS — Z79899 Other long term (current) drug therapy: Secondary | ICD-10-CM | POA: Insufficient documentation

## 2023-07-21 DIAGNOSIS — Z992 Dependence on renal dialysis: Secondary | ICD-10-CM | POA: Insufficient documentation

## 2023-07-21 DIAGNOSIS — I132 Hypertensive heart and chronic kidney disease with heart failure and with stage 5 chronic kidney disease, or end stage renal disease: Secondary | ICD-10-CM | POA: Insufficient documentation

## 2023-07-21 DIAGNOSIS — R2243 Localized swelling, mass and lump, lower limb, bilateral: Secondary | ICD-10-CM | POA: Insufficient documentation

## 2023-07-21 DIAGNOSIS — I120 Hypertensive chronic kidney disease with stage 5 chronic kidney disease or end stage renal disease: Secondary | ICD-10-CM | POA: Diagnosis not present

## 2023-07-21 DIAGNOSIS — N186 End stage renal disease: Secondary | ICD-10-CM | POA: Insufficient documentation

## 2023-07-21 DIAGNOSIS — I509 Heart failure, unspecified: Secondary | ICD-10-CM | POA: Insufficient documentation

## 2023-07-21 LAB — CBC
HCT: 23.7 % — ABNORMAL LOW (ref 39.0–52.0)
Hemoglobin: 8 g/dL — ABNORMAL LOW (ref 13.0–17.0)
MCH: 34.6 pg — ABNORMAL HIGH (ref 26.0–34.0)
MCHC: 33.8 g/dL (ref 30.0–36.0)
MCV: 102.6 fL — ABNORMAL HIGH (ref 80.0–100.0)
Platelets: 257 10*3/uL (ref 150–400)
RBC: 2.31 MIL/uL — ABNORMAL LOW (ref 4.22–5.81)
RDW: 14.9 % (ref 11.5–15.5)
WBC: 9.8 10*3/uL (ref 4.0–10.5)
nRBC: 0 % (ref 0.0–0.2)

## 2023-07-21 LAB — IRON AND TIBC
Iron: 75 ug/dL (ref 45–182)
Saturation Ratios: 31 % (ref 17.9–39.5)
TIBC: 244 ug/dL — ABNORMAL LOW (ref 250–450)
UIBC: 169 ug/dL

## 2023-07-21 LAB — RENAL FUNCTION PANEL
Albumin: 3.5 g/dL (ref 3.5–5.0)
Anion gap: 19 — ABNORMAL HIGH (ref 5–15)
BUN: 121 mg/dL — ABNORMAL HIGH (ref 6–20)
CO2: 16 mmol/L — ABNORMAL LOW (ref 22–32)
Calcium: 7.2 mg/dL — ABNORMAL LOW (ref 8.9–10.3)
Chloride: 104 mmol/L (ref 98–111)
Creatinine, Ser: 17.09 mg/dL — ABNORMAL HIGH (ref 0.61–1.24)
GFR, Estimated: 3 mL/min — ABNORMAL LOW (ref >60)
Glucose, Bld: 109 mg/dL — ABNORMAL HIGH (ref 70–99)
Phosphorus: 10.5 mg/dL — ABNORMAL HIGH (ref 2.5–4.6)
Potassium: 6.1 mmol/L — ABNORMAL HIGH (ref 3.5–5.1)
Sodium: 139 mmol/L (ref 135–145)

## 2023-07-21 LAB — FERRITIN: Ferritin: 1175 ng/mL — ABNORMAL HIGH (ref 24–336)

## 2023-07-21 MED ORDER — PENTAFLUOROPROP-TETRAFLUOROETH EX AERO: 1.0000 | INHALATION_SPRAY | CUTANEOUS | Status: DC | PRN

## 2023-07-21 MED ORDER — LIDOCAINE-PRILOCAINE 2.5-2.5 % EX CREA: 1.0000 | TOPICAL_CREAM | CUTANEOUS | Status: DC | PRN

## 2023-07-21 MED ORDER — HEPARIN SODIUM (PORCINE) 1000 UNIT/ML DIALYSIS
2500.0000 [IU] | Freq: Once | INTRAMUSCULAR | Status: AC
  Administered 2023-07-21: 2500 [IU] via INTRAVENOUS_CENTRAL
  Filled 2023-07-21: qty 3

## 2023-07-21 MED ORDER — LIDOCAINE HCL (PF) 1 % IJ SOLN: 5.0000 mL | INTRAMUSCULAR | Status: DC | PRN

## 2023-07-21 MED ORDER — NEPRO/CARBSTEADY PO LIQD: 237.0000 mL | ORAL | Status: DC | PRN

## 2023-07-21 MED ORDER — ALTEPLASE 2 MG IJ SOLR: 2.0000 mg | Freq: Once | INTRAMUSCULAR | Status: DC | PRN

## 2023-07-21 MED ORDER — HEPARIN SODIUM (PORCINE) 1000 UNIT/ML DIALYSIS
1000.0000 [IU] | INTRAMUSCULAR | Status: AC | PRN
  Administered 2023-07-21: 1000 [IU] via INTRAVENOUS_CENTRAL

## 2023-07-21 MED ORDER — ANTICOAGULANT SODIUM CITRATE 4% (200MG/5ML) IV SOLN: 5.0000 mL | Status: DC | PRN

## 2023-07-21 MED ORDER — CHLORHEXIDINE GLUCONATE CLOTH 2 % EX PADS: 6.0000 | MEDICATED_PAD | Freq: Every day | CUTANEOUS | Status: DC

## 2023-07-21 MED ORDER — HEPARIN SODIUM (PORCINE) 1000 UNIT/ML DIALYSIS: 1000.0000 [IU] | INTRAMUSCULAR | Status: DC | PRN

## 2023-07-21 NOTE — Procedures (Signed)
 Received patient in bed to unit.  Alert and oriented.  Informed consent signed and in chart.   TX duration:3.5 Hours  Patient tolerated well.  Transported back to the  ER   Alert, without acute distress.  Hand-off given to patient's nurse.   Access used: Right Fistula  Access issues: None   Total UF removed: 3.5 Liters Medication(s) given: Heparin (2500 units inial start of tx), (1000 units mid tx) Post HD weight: 89.8 Kg Post HD VS: 178/109 B/P, 84P, 22 Resp, 100 % RA    Jovi Zavadil S [facility]

## 2023-07-21 NOTE — ED Triage Notes (Addendum)
 Pt.stated, I need dialysis , I was kicked oot of the kidney center and the practice so they sent me here until I can find another.My last treatment was last  Wednesday. Full treatment.

## 2023-07-21 NOTE — Discharge Instructions (Signed)
 You were seen in the emergency department for your dialysis.  You should continue to come to the emergency department every Monday Wednesday Friday for your dialysis sessions until you are establish a new outpatient dialysis center.  You can return to the emergency department sooner if you have any other new or concerning symptoms.

## 2023-07-21 NOTE — ED Notes (Signed)
 Report given to dialysis nurse. Dialysis to send for pt is approx 20 minutes

## 2023-07-21 NOTE — ED Notes (Signed)
 Consent obtained for hemodialysis. Paper remains with RN at desk.

## 2023-07-21 NOTE — ED Notes (Signed)
 Patient transported to dialysis

## 2023-07-21 NOTE — ED Provider Notes (Signed)
 [place] EMERGENCY DEPARTMENT AT [facility] Provider Note   CSN: [id number] Arrival date & time: 07/21/23  1324     History  Chief Complaint  Patient presents with   Vascular Access Problem    Paul [name] is a 58 y.o. male.  Patient is a 58 year old male with a past medical history of ESRD without home HD center, schizophrenia, hypertension, CHF who presented to the emergency department for dialysis.  The patient states that his last full dialysis session was last Wednesday.  He states he has overall been feeling pretty well since then.  Reported some mild weakness and occasional shortness of breath.  Denies any significant lower extremity swelling.  Of note, he was seen in the ED on Friday but left prior to his dialysis session.  The history is provided by the patient.       Home Medications Prior to Admission medications   Medication Sig Start Date End Date Taking? Authorizing Provider  isosorbide mononitrate (IMDUR) 30 MG 24 hr tablet Take 1 tablet (30 mg total) by mouth daily. 05/16/23  Yes   ketoconazole (NIZORAL) 2 % cream Apply 1 Application topically daily. 07/08/23  Yes McDonald, Rachelle Hora, DPM  rosuvastatin (CRESTOR) 5 MG tablet Take 1 tablet (5 mg total) by mouth daily. 03/24/23  Yes   sucroferric oxyhydroxide (VELPHORO) 500 MG chewable tablet Chew 2 tablets (1,000 mg total) by mouth with breakfast, with lunch, and with evening meal. 08/08/22  Yes   carvedilol (COREG) 6.25 MG tablet Take 1 tablet (6.25 mg total) by mouth 2 (two) times daily with a meal. Patient not taking: Reported on 07/08/2023 06/15/22 06/22/23  Estanislado Emms, MD  lidocaine-prilocaine (EMLA) cream Apply 1 a small amount to skin three times a week apply to AVF 30-60 min prior to dialysis, wrap site w plastic wrap Patient not taking: Reported on 07/08/2023 09/12/22     OLANZapine zydis (ZYPREXA) 15 MG disintegrating tablet Take 1 tablet (15 mg total) by mouth at bedtime. Patient not taking: Reported  on 07/21/2023 07/14/23   Eligha Bridegroom, NP  rosuvastatin (CRESTOR) 5 MG tablet Take 1 tablet by mouth once a day Patient not taking: Reported on 07/08/2023 11/21/22     sucroferric oxyhydroxide (VELPHORO) 500 MG chewable tablet Chew 3 tablets (1,500 mg total) by mouth 3 (three) times daily with meals. Patient not taking: Reported on 07/08/2023 04/02/23         Allergies    Penicillin g    Review of Systems   Review of Systems  Physical Exam Updated Vital Signs BP (!) 176/112 (BP Location: Left Arm)   Pulse 79   Temp 98 F (36.7 C) (Oral)   Resp 17   Wt 83.3 kg   SpO2 100%   BMI 32.53 kg/m  Physical Exam Vitals and nursing note reviewed.  Constitutional:      General: He is not in acute distress.    Appearance: Normal appearance.  HENT:     Head: Normocephalic and atraumatic.     Nose: Nose normal.     Mouth/Throat:     Mouth: Mucous membranes are moist.  Eyes:     Extraocular Movements: Extraocular movements intact.     Conjunctiva/sclera: Conjunctivae normal.  Cardiovascular:     Rate and Rhythm: Normal rate and regular rhythm.     Heart sounds: Normal heart sounds.  Pulmonary:     Effort: Pulmonary effort is normal.     Breath sounds: Normal breath sounds.  Abdominal:     General: Abdomen is flat.     Palpations: Abdomen is soft.     Tenderness: There is no abdominal tenderness.  Musculoskeletal:        General: Normal range of motion.     Cervical back: Normal range of motion.     Right lower leg: Edema (1+) present.     Left lower leg: Edema (1+) present.  Skin:    General: Skin is warm and dry.  Neurological:     General: No focal deficit present.     Mental Status: He is alert and oriented to person, place, and time.  Psychiatric:        Mood and Affect: Mood normal.        Behavior: Behavior normal.     ED Results / Procedures / Treatments   Labs (all labs ordered are listed, but only abnormal results are displayed) Labs Reviewed  RENAL FUNCTION  PANEL - Abnormal; Notable for the following components:      Result Value   Potassium 6.1 (*)    CO2 16 (*)    Glucose, Bld 109 (*)    BUN 121 (*)    Creatinine, Ser 17.09 (*)    Calcium 7.2 (*)    Phosphorus 10.5 (*)    GFR, Estimated 3 (*)    Anion gap 19 (*)    All other components within normal limits  CBC - Abnormal; Notable for the following components:   RBC 2.31 (*)    Hemoglobin 8.0 (*)    HCT 23.7 (*)    MCV 102.6 (*)    MCH 34.6 (*)    All other components within normal limits  IRON AND TIBC - Abnormal; Notable for the following components:   TIBC 244 (*)    All other components within normal limits  FERRITIN - Abnormal; Notable for the following components:   Ferritin 1,175 (*)    All other components within normal limits    EKG None  Radiology No results found.  Procedures Procedures    Medications Ordered in ED Medications  Chlorhexidine Gluconate Cloth 2 % PADS 6 each (0 each Topical Hold 07/21/23 0853)  pentafluoroprop-tetrafluoroeth (GEBAUERS) aerosol 1 Application (has no administration in time range)  lidocaine (PF) (XYLOCAINE) 1 % injection 5 mL (has no administration in time range)  lidocaine-prilocaine (EMLA) cream 1 Application (has no administration in time range)  feeding supplement (NEPRO CARB STEADY) liquid 237 mL (has no administration in time range)  heparin injection 1,000 Units (has no administration in time range)  anticoagulant sodium citrate solution 5 mL (has no administration in time range)  alteplase (CATHFLO ACTIVASE) injection 2 mg (has no administration in time range)  heparin injection 2,500 Units (2,500 Units Dialysis Given 07/21/23 1130)  heparin injection 1,000 Units (1,000 Units Dialysis Given 07/21/23 1426)    ED Course/ Medical Decision Making/ A&P Clinical Course as of 07/21/23 1521  Mon Jul 21, 2023  0822 I spoke with Dr. Arlean Hopping with nephrology who will add the patient to the HD schedule, can get labs in HD. [VK]   1520 Patient signed out to oncoming team pending reassessment after HD [VK]    Clinical Course User Index [VK] Rexford Maus, DO                                 Medical Decision Making This patient presents to the ED with  chief complaint(s) of needing HD with pertinent past medical history of ESRD, hypertension, CHF, schizophrenia which further complicates the presenting complaint. The complaint involves an extensive differential diagnosis and also carries with it a high risk of complications and morbidity.    The differential diagnosis includes electrolyte derangement, mild volume overload  Additional history obtained: Additional history obtained from N/A Records reviewed previous admission documents  ED Course and Reassessment: On patient's arrival he is hypertensive but otherwise hemodynamically stable in no acute distress.  Nephrology will be consulted for dialysis.  Last labs done on Friday were normal, will determine if he needs repeat labs prior to HD today versus having labs drawn at HD.  Independent labs interpretation:  The following labs were independently interpreted: mild hyperkalemia, otherwise at baseline  Independent visualization of imaging: - N/A  Consultation: - Consulted or discussed management/test interpretation w/ external professional: nephrology  Social Determinants of health: no home HD center            Final Clinical Impression(s) / ED Diagnoses Final diagnoses:  ESRD on dialysis [facility])    Rx / DC Orders ED Discharge Orders     None         Rexford Maus, DO 07/21/23 1521

## 2023-07-21 NOTE — Procedures (Addendum)
 Asked to see this patient for dialysis. The pt was discharged from [facility] dialysis unit due to behavior issues in early March 2025. The plan will be for "ED HD". Pt is not to be admitted. Pt will go upstairs for dialysis when they are ready for the patient. When dialysis completed pt will be sent back to ED for reassessment.    Last OP HD unit info (march 2025) 4h  B400  76.5kg  2K bath AVF  Heparin 2400  I was present at the procedure, reviewed the HD regimen and made appropriate changes.   Vinson Moselle MD  [facility] 07/21/2023, 11:27 AM

## 2023-07-22 ENCOUNTER — Ambulatory Visit: Payer: Self-pay | Admitting: *Deleted

## 2023-07-22 LAB — HEPATITIS B CORE ANTIBODY, TOTAL: HEP B CORE AB: NEGATIVE

## 2023-07-22 NOTE — Patient Outreach (Signed)
 Care Coordination   07/22/2023 Name: KYLAN LIBERATI MRN: [id number] DOB: [birth year]   Care Coordination Outreach Attempts:  An unsuccessful outreach was attempted for an appointment today. 2nd attempt to complete Initial visit.   Follow Up Plan:  Additional outreach attempts will be made to offer the patient complex care management information and services.   Encounter Outcome:  No Answer   Care Coordination Interventions:  No, not indicated. Staff message sent to care guide requesting outreach and rescheduling.    Demetrios Loll, RN, BSN [place]  [facility], [facility] RN Care Manager Direct Dial: [phone]

## 2023-07-23 ENCOUNTER — Ambulatory Visit: Payer: Self-pay

## 2023-07-23 ENCOUNTER — Emergency Department (HOSPITAL_COMMUNITY)
Admission: EM | Admit: 2023-07-23 | Discharge: 2023-07-23 | Discharge status: Against Medical Advice | Source: Home / Self Care

## 2023-07-23 ENCOUNTER — Encounter (HOSPITAL_COMMUNITY): Payer: Self-pay | Admitting: Emergency Medicine

## 2023-07-23 ENCOUNTER — Other Ambulatory Visit: Payer: Self-pay

## 2023-07-23 DIAGNOSIS — Z5321 Procedure and treatment not carried out due to patient leaving prior to being seen by health care provider: Secondary | ICD-10-CM | POA: Insufficient documentation

## 2023-07-23 DIAGNOSIS — Z992 Dependence on renal dialysis: Secondary | ICD-10-CM | POA: Insufficient documentation

## 2023-07-23 LAB — CBC WITH DIFFERENTIAL/PLATELET
Abs Immature Granulocytes: 0.03 10*3/uL (ref 0.00–0.07)
Basophils Absolute: 0.1 10*3/uL (ref 0.0–0.1)
Basophils Relative: 1 %
Eosinophils Absolute: 0.2 10*3/uL (ref 0.0–0.5)
Eosinophils Relative: 2 %
HCT: 26.5 % — ABNORMAL LOW (ref 39.0–52.0)
Hemoglobin: 8.9 g/dL — ABNORMAL LOW (ref 13.0–17.0)
Immature Granulocytes: 0 %
Lymphocytes Relative: 16 %
Lymphs Abs: 1.2 10*3/uL (ref 0.7–4.0)
MCH: 34.1 pg — ABNORMAL HIGH (ref 26.0–34.0)
MCHC: 33.6 g/dL (ref 30.0–36.0)
MCV: 101.5 fL — ABNORMAL HIGH (ref 80.0–100.0)
Monocytes Absolute: 0.8 10*3/uL (ref 0.1–1.0)
Monocytes Relative: 11 %
Neutro Abs: 5.3 10*3/uL (ref 1.7–7.7)
Neutrophils Relative %: 70 %
Platelets: 253 10*3/uL (ref 150–400)
RBC: 2.61 MIL/uL — ABNORMAL LOW (ref 4.22–5.81)
RDW: 14.9 % (ref 11.5–15.5)
WBC: 7.6 10*3/uL (ref 4.0–10.5)
nRBC: 0 % (ref 0.0–0.2)

## 2023-07-23 LAB — BASIC METABOLIC PANEL
Anion gap: 18 — ABNORMAL HIGH (ref 5–15)
BUN: 81 mg/dL — ABNORMAL HIGH (ref 6–20)
CO2: 22 mmol/L (ref 22–32)
Calcium: 7.5 mg/dL — ABNORMAL LOW (ref 8.9–10.3)
Chloride: 99 mmol/L (ref 98–111)
Creatinine, Ser: 13.43 mg/dL — ABNORMAL HIGH (ref 0.61–1.24)
GFR, Estimated: 4 mL/min — ABNORMAL LOW (ref >60)
Glucose, Bld: 93 mg/dL (ref 70–99)
Potassium: 4.9 mmol/L (ref 3.5–5.1)
Sodium: 139 mmol/L (ref 135–145)

## 2023-07-23 NOTE — ED Triage Notes (Signed)
 Pt here for dialysis, last treatment Monday.

## 2023-07-23 NOTE — ED Notes (Signed)
 No answer for vitals OTF

## 2023-07-23 NOTE — Patient Outreach (Signed)
 Care Coordination   Initial Visit Note   07/23/2023 Name: Paul [name] MRN: [id number] DOB: February 06, 1966  Paul [name] is a 58 y.o. year old male who sees Marcine Matar, MD for primary care. I spoke with  Paul [name] by phone today.  What matters to the patients health and wellness today?  Patient is currently at the Emergency Room for dialysis.  Patient needs housing and transportation information.     Goals Addressed             This Visit's Progress    Care Coordination Activities       Interventions Today    Flowsheet Row Most Recent Value  Chronic Disease   Chronic disease during today's visit Congestive Heart Failure (CHF), Hypertension (HTN), Chronic Kidney Disease/End Stage Renal Disease (ESRD)  General Interventions   General Interventions Discussed/Reviewed General Interventions Discussed, General Interventions Reviewed, [employer]  [Pt reports he has lived in a boarding room (1.5 yrs) & pays 680.Pt disability is 833.Pt reports living situation is good but would like his own place.SW provides link to apply for public housing due to limited income.]  Education Interventions   Education Provided Provided Education  [SW educated pt on [employer] and pt agreed to contact DSS to enroll for upcoming medical visits.]              SDOH assessments and interventions completed:  Yes  SDOH Interventions Today    Flowsheet Row Most Recent Value  SDOH Interventions   Food Insecurity Interventions Intervention Not Indicated  [Foodstamps]  Housing Interventions Intervention Not Indicated  [Lives in boarding house]  Transportation Interventions Intervention Not Indicated  Utilities Interventions Intervention Not Indicated  [Does not pay utilities]        Care Coordination Interventions:  Yes, provided   Follow up plan: No further intervention required.   Encounter Outcome:  Patient Visit Completed

## 2023-07-23 NOTE — Patient Instructions (Signed)
 Visit Information  Thank you for taking time to visit with me today. Please don't hesitate to contact me if I can be of assistance to you.   Following are the goals we discussed today:  Patient will apply for housing authority. Patient will contact DSS to enroll with Medicaid Transportation.   If you are experiencing a Mental Health or Behavioral Health Crisis or need someone to talk to, please call 911  Patient verbalizes understanding of instructions and care plan provided today and agrees to view in MyChart. Active MyChart status and patient understanding of how to access instructions and care plan via MyChart confirmed with patient.     No further follow up required: Patient does not request a follow up visit.  Lysle Morales, BSW   [facility], [facility] Social Worker Direct Dial: [phone]  Fax: [phone] Website: [email or web address]

## 2023-07-24 ENCOUNTER — Emergency Department (HOSPITAL_COMMUNITY)

## 2023-07-24 ENCOUNTER — Inpatient Hospital Stay (HOSPITAL_COMMUNITY)
Admission: EM | Admit: 2023-07-24 | Discharge: 2023-07-29 | DRG: 082 | Disposition: A | Discharge status: Home / Self Care | Attending: Internal Medicine | Admitting: Internal Medicine

## 2023-07-24 ENCOUNTER — Encounter (HOSPITAL_COMMUNITY): Payer: Self-pay

## 2023-07-24 ENCOUNTER — Other Ambulatory Visit: Payer: Self-pay

## 2023-07-24 DIAGNOSIS — Z79899 Other long term (current) drug therapy: Secondary | ICD-10-CM

## 2023-07-24 DIAGNOSIS — Z880 Allergy status to penicillin: Secondary | ICD-10-CM | POA: Diagnosis not present

## 2023-07-24 DIAGNOSIS — Z8249 Family history of ischemic heart disease and other diseases of the circulatory system: Secondary | ICD-10-CM | POA: Diagnosis not present

## 2023-07-24 DIAGNOSIS — S0101XA Laceration without foreign body of scalp, initial encounter: Secondary | ICD-10-CM | POA: Diagnosis present

## 2023-07-24 DIAGNOSIS — E785 Hyperlipidemia, unspecified: Secondary | ICD-10-CM | POA: Diagnosis present

## 2023-07-24 DIAGNOSIS — K8020 Calculus of gallbladder without cholecystitis without obstruction: Secondary | ICD-10-CM | POA: Diagnosis not present

## 2023-07-24 DIAGNOSIS — N186 End stage renal disease: Secondary | ICD-10-CM | POA: Diagnosis present

## 2023-07-24 DIAGNOSIS — S3993XA Unspecified injury of pelvis, initial encounter: Secondary | ICD-10-CM | POA: Diagnosis not present

## 2023-07-24 DIAGNOSIS — R220 Localized swelling, mass and lump, head: Secondary | ICD-10-CM | POA: Diagnosis not present

## 2023-07-24 DIAGNOSIS — Z823 Family history of stroke: Secondary | ICD-10-CM

## 2023-07-24 DIAGNOSIS — W228XXA Striking against or struck by other objects, initial encounter: Secondary | ICD-10-CM | POA: Diagnosis not present

## 2023-07-24 DIAGNOSIS — I120 Hypertensive chronic kidney disease with stage 5 chronic kidney disease or end stage renal disease: Secondary | ICD-10-CM | POA: Diagnosis not present

## 2023-07-24 DIAGNOSIS — L03811 Cellulitis of head [any part, except face]: Secondary | ICD-10-CM | POA: Diagnosis not present

## 2023-07-24 DIAGNOSIS — S4982XA Other specified injuries of left shoulder and upper arm, initial encounter: Secondary | ICD-10-CM | POA: Diagnosis not present

## 2023-07-24 DIAGNOSIS — S199XXA Unspecified injury of neck, initial encounter: Secondary | ICD-10-CM | POA: Diagnosis not present

## 2023-07-24 DIAGNOSIS — S065XAA Traumatic subdural hemorrhage with loss of consciousness status unknown, initial encounter: Secondary | ICD-10-CM | POA: Diagnosis not present

## 2023-07-24 DIAGNOSIS — F250 Schizoaffective disorder, bipolar type: Secondary | ICD-10-CM | POA: Diagnosis present

## 2023-07-24 DIAGNOSIS — Y9355 Activity, bike riding: Secondary | ICD-10-CM | POA: Diagnosis not present

## 2023-07-24 DIAGNOSIS — S0990XA Unspecified injury of head, initial encounter: Secondary | ICD-10-CM | POA: Diagnosis not present

## 2023-07-24 DIAGNOSIS — N2581 Secondary hyperparathyroidism of renal origin: Secondary | ICD-10-CM | POA: Diagnosis present

## 2023-07-24 DIAGNOSIS — Z992 Dependence on renal dialysis: Secondary | ICD-10-CM

## 2023-07-24 DIAGNOSIS — I132 Hypertensive heart and chronic kidney disease with heart failure and with stage 5 chronic kidney disease, or end stage renal disease: Secondary | ICD-10-CM | POA: Diagnosis present

## 2023-07-24 DIAGNOSIS — S0003XA Contusion of scalp, initial encounter: Secondary | ICD-10-CM | POA: Diagnosis not present

## 2023-07-24 DIAGNOSIS — H548 Legal blindness, as defined in USA: Secondary | ICD-10-CM | POA: Diagnosis present

## 2023-07-24 DIAGNOSIS — S40212A Abrasion of left shoulder, initial encounter: Secondary | ICD-10-CM | POA: Diagnosis present

## 2023-07-24 DIAGNOSIS — E8779 Other fluid overload: Secondary | ICD-10-CM | POA: Diagnosis not present

## 2023-07-24 DIAGNOSIS — W19XXXA Unspecified fall, initial encounter: Secondary | ICD-10-CM | POA: Diagnosis not present

## 2023-07-24 DIAGNOSIS — R410 Disorientation, unspecified: Secondary | ICD-10-CM | POA: Diagnosis not present

## 2023-07-24 DIAGNOSIS — S065X0A Traumatic subdural hemorrhage without loss of consciousness, initial encounter: Secondary | ICD-10-CM | POA: Diagnosis not present

## 2023-07-24 DIAGNOSIS — D539 Nutritional anemia, unspecified: Secondary | ICD-10-CM | POA: Diagnosis present

## 2023-07-24 DIAGNOSIS — T8133XA Disruption of traumatic injury wound repair, initial encounter: Secondary | ICD-10-CM | POA: Diagnosis not present

## 2023-07-24 DIAGNOSIS — I609 Nontraumatic subarachnoid hemorrhage, unspecified: Secondary | ICD-10-CM | POA: Diagnosis not present

## 2023-07-24 DIAGNOSIS — E875 Hyperkalemia: Secondary | ICD-10-CM | POA: Diagnosis present

## 2023-07-24 DIAGNOSIS — S3991XA Unspecified injury of abdomen, initial encounter: Secondary | ICD-10-CM | POA: Diagnosis not present

## 2023-07-24 DIAGNOSIS — S0291XA Unspecified fracture of skull, initial encounter for closed fracture: Secondary | ICD-10-CM | POA: Diagnosis not present

## 2023-07-24 DIAGNOSIS — Y848 Other medical procedures as the cause of abnormal reaction of the patient, or of later complication, without mention of misadventure at the time of the procedure: Secondary | ICD-10-CM | POA: Diagnosis not present

## 2023-07-24 DIAGNOSIS — M25512 Pain in left shoulder: Secondary | ICD-10-CM | POA: Diagnosis not present

## 2023-07-24 DIAGNOSIS — S066XAA Traumatic subarachnoid hemorrhage with loss of consciousness status unknown, initial encounter: Secondary | ICD-10-CM | POA: Diagnosis present

## 2023-07-24 DIAGNOSIS — D631 Anemia in chronic kidney disease: Secondary | ICD-10-CM | POA: Diagnosis present

## 2023-07-24 DIAGNOSIS — Z23 Encounter for immunization: Secondary | ICD-10-CM

## 2023-07-24 DIAGNOSIS — R0989 Other specified symptoms and signs involving the circulatory and respiratory systems: Secondary | ICD-10-CM | POA: Diagnosis not present

## 2023-07-24 DIAGNOSIS — I5042 Chronic combined systolic (congestive) and diastolic (congestive) heart failure: Secondary | ICD-10-CM | POA: Diagnosis present

## 2023-07-24 DIAGNOSIS — Z96643 Presence of artificial hip joint, bilateral: Secondary | ICD-10-CM | POA: Diagnosis present

## 2023-07-24 DIAGNOSIS — Y92413 State road as the place of occurrence of the external cause: Secondary | ICD-10-CM | POA: Diagnosis not present

## 2023-07-24 DIAGNOSIS — I615 Nontraumatic intracerebral hemorrhage, intraventricular: Secondary | ICD-10-CM | POA: Diagnosis not present

## 2023-07-24 DIAGNOSIS — I10 Essential (primary) hypertension: Secondary | ICD-10-CM | POA: Diagnosis present

## 2023-07-24 DIAGNOSIS — S299XXA Unspecified injury of thorax, initial encounter: Secondary | ICD-10-CM | POA: Diagnosis not present

## 2023-07-24 DIAGNOSIS — I6200 Nontraumatic subdural hemorrhage, unspecified: Secondary | ICD-10-CM | POA: Diagnosis not present

## 2023-07-24 DIAGNOSIS — I6201 Nontraumatic acute subdural hemorrhage: Secondary | ICD-10-CM | POA: Diagnosis not present

## 2023-07-24 DIAGNOSIS — I5040 Unspecified combined systolic (congestive) and diastolic (congestive) heart failure: Secondary | ICD-10-CM | POA: Diagnosis present

## 2023-07-24 LAB — COMPREHENSIVE METABOLIC PANEL
ALT: 14 U/L (ref 0–44)
AST: 25 U/L (ref 15–41)
Albumin: 3.7 g/dL (ref 3.5–5.0)
Alkaline Phosphatase: 58 U/L (ref 38–126)
Anion gap: 18 — ABNORMAL HIGH (ref 5–15)
BUN: 88 mg/dL — ABNORMAL HIGH (ref 6–20)
CO2: 22 mmol/L (ref 22–32)
Calcium: 7 mg/dL — ABNORMAL LOW (ref 8.9–10.3)
Chloride: 99 mmol/L (ref 98–111)
Creatinine, Ser: 15.2 mg/dL — ABNORMAL HIGH (ref 0.61–1.24)
GFR, Estimated: 3 mL/min — ABNORMAL LOW (ref >60)
Glucose, Bld: 92 mg/dL (ref 70–99)
Potassium: 5.1 mmol/L (ref 3.5–5.1)
Sodium: 139 mmol/L (ref 135–145)
Total Bilirubin: 1.1 mg/dL (ref 0.0–1.2)
Total Protein: 7.1 g/dL (ref 6.5–8.1)

## 2023-07-24 LAB — I-STAT CHEM 8, ED
BUN: 95 mg/dL — ABNORMAL HIGH (ref 6–20)
Calcium, Ion: 0.75 mmol/L — CL (ref 1.15–1.40)
Chloride: 103 mmol/L (ref 98–111)
Creatinine, Ser: 17.6 mg/dL — ABNORMAL HIGH (ref 0.61–1.24)
Glucose, Bld: 88 mg/dL (ref 70–99)
HCT: 26 % — ABNORMAL LOW (ref 39.0–52.0)
Hemoglobin: 8.8 g/dL — ABNORMAL LOW (ref 13.0–17.0)
Potassium: 5.1 mmol/L (ref 3.5–5.1)
Sodium: 138 mmol/L (ref 135–145)
TCO2: 23 mmol/L (ref 22–32)

## 2023-07-24 LAB — I-STAT CG4 LACTIC ACID, ED: Lactic Acid, Venous: 1.6 mmol/L (ref 0.5–1.9)

## 2023-07-24 LAB — SAMPLE TO BLOOD BANK

## 2023-07-24 LAB — RAPID URINE DRUG SCREEN, HOSP PERFORMED
Amphetamines: NOT DETECTED
Barbiturates: NOT DETECTED
Benzodiazepines: NOT DETECTED
Cocaine: NOT DETECTED
Opiates: NOT DETECTED
Tetrahydrocannabinol: NOT DETECTED

## 2023-07-24 LAB — CBC
HCT: 26.2 % — ABNORMAL LOW (ref 39.0–52.0)
Hemoglobin: 9.1 g/dL — ABNORMAL LOW (ref 13.0–17.0)
MCH: 34.9 pg — ABNORMAL HIGH (ref 26.0–34.0)
MCHC: 34.7 g/dL (ref 30.0–36.0)
MCV: 100.4 fL — ABNORMAL HIGH (ref 80.0–100.0)
Platelets: 272 10*3/uL (ref 150–400)
RBC: 2.61 MIL/uL — ABNORMAL LOW (ref 4.22–5.81)
RDW: 15 % (ref 11.5–15.5)
WBC: 8.4 10*3/uL (ref 4.0–10.5)
nRBC: 0 % (ref 0.0–0.2)

## 2023-07-24 LAB — PROTIME-INR
INR: 1 (ref 0.8–1.2)
Prothrombin Time: 13.7 s (ref 11.4–15.2)

## 2023-07-24 LAB — CBG MONITORING, ED: Glucose-Capillary: 70 mg/dL (ref 70–99)

## 2023-07-24 LAB — ETHANOL: Alcohol, Ethyl (B): <10 mg/dL (ref <10)

## 2023-07-24 LAB — HIV ANTIBODY (ROUTINE TESTING W REFLEX): HIV Screen 4th Generation wRfx: NONREACTIVE

## 2023-07-24 MED ORDER — LIDOCAINE-EPINEPHRINE (PF) 2 %-1:200000 IJ SOLN
20.0000 mL | Freq: Once | INTRAMUSCULAR | Status: AC
  Administered 2023-07-24: 20 mL
  Filled 2023-07-24: qty 20

## 2023-07-24 MED ORDER — TETANUS-DIPHTH-ACELL PERTUSSIS 5-2.5-18.5 LF-MCG/0.5 IM SUSY
0.5000 mL | PREFILLED_SYRINGE | Freq: Once | INTRAMUSCULAR | Status: AC
  Administered 2023-07-24: 0.5 mL via INTRAMUSCULAR
  Filled 2023-07-24: qty 0.5

## 2023-07-24 MED ORDER — ACETAMINOPHEN 325 MG PO TABS
650.0000 mg | ORAL_TABLET | Freq: Four times a day (QID) | ORAL | Status: DC | PRN
  Administered 2023-07-27: 650 mg via ORAL
  Filled 2023-07-24: qty 2

## 2023-07-24 MED ORDER — IOHEXOL 350 MG/ML SOLN
75.0000 mL | Freq: Once | INTRAVENOUS | Status: AC | PRN
  Administered 2023-07-24: 75 mL via INTRAVENOUS

## 2023-07-24 MED ORDER — CHLORHEXIDINE GLUCONATE CLOTH 2 % EX PADS
6.0000 | MEDICATED_PAD | Freq: Every day | CUTANEOUS | Status: DC
  Administered 2023-07-27 – 2023-07-29 (×3): 6 via TOPICAL

## 2023-07-24 MED ORDER — ROSUVASTATIN CALCIUM 5 MG PO TABS
5.0000 mg | ORAL_TABLET | Freq: Every day | ORAL | Status: DC
  Administered 2023-07-26 – 2023-07-29 (×4): 5 mg via ORAL
  Filled 2023-07-24 (×4): qty 1

## 2023-07-24 MED ORDER — SUCROFERRIC OXYHYDROXIDE 500 MG PO CHEW
1500.0000 mg | CHEWABLE_TABLET | Freq: Three times a day (TID) | ORAL | Status: DC
Start: 2023-07-24 — End: 2023-07-29
  Administered 2023-07-24 – 2023-07-29 (×12): 1500 mg via ORAL
  Filled 2023-07-24 (×17): qty 3

## 2023-07-24 MED ORDER — ACETAMINOPHEN 650 MG RE SUPP: 650.0000 mg | Freq: Four times a day (QID) | RECTAL | Status: DC | PRN

## 2023-07-24 MED ORDER — LABETALOL HCL 5 MG/ML IV SOLN: 10.0000 mg | INTRAVENOUS | Status: DC | PRN

## 2023-07-24 MED ORDER — CEFAZOLIN SODIUM-DEXTROSE 2-4 GM/100ML-% IV SOLN
2.0000 g | Freq: Once | INTRAVENOUS | Status: AC
  Administered 2023-07-24: 2 g via INTRAVENOUS
  Filled 2023-07-24: qty 100

## 2023-07-24 MED ORDER — LABETALOL HCL 5 MG/ML IV SOLN
10.0000 mg | INTRAVENOUS | Status: DC | PRN
  Administered 2023-07-24 – 2023-07-26 (×2): 10 mg via INTRAVENOUS
  Filled 2023-07-24 (×2): qty 4

## 2023-07-24 MED ORDER — POLYETHYLENE GLYCOL 3350 17 G PO PACK: 17.0000 g | PACK | Freq: Every day | ORAL | Status: DC | PRN

## 2023-07-24 NOTE — ED Notes (Signed)
 Pt urinated in bed. RN and student cleaned pt and changed sheets and chucks at this time. Pt repositioned in bed.
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
## 2023-07-24 NOTE — TOC CAGE-AID Note (Signed)
 Transition of Care [facility]) - CAGE-AID Screening  Patient Details  Name: Paul [name] MRN: [id number] Date of Birth: Feb 02, 1966  Clinical Narrative:  Patient denies any current alcohol and drug use. Patient is GCS 14 and currently confused but chart review reveals no recent toxicology with indication of substance abuse. Patient denies need for substance abuse resources at this time.  CAGE-AID Screening:   Have You Ever Felt You Ought to Cut Down on Your Drinking or Drug Use?: No Have People Annoyed You By Critizing Your Drinking Or Drug Use?: No Have You Felt Bad Or Guilty About Your Drinking Or Drug Use?: No Have You Ever Had a Drink or Used Drugs First Thing In The Morning to Steady Your Nerves or to Get Rid of a Hangover?: No CAGE-AID Score: 0  Substance Abuse Education Offered: No
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
## 2023-07-24 NOTE — ED Notes (Signed)
 RN wrapped pts head at this time. Bleeding controlled
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
## 2023-07-25 ENCOUNTER — Inpatient Hospital Stay (HOSPITAL_COMMUNITY)

## 2023-07-25 DIAGNOSIS — S065XAA Traumatic subdural hemorrhage with loss of consciousness status unknown, initial encounter: Secondary | ICD-10-CM

## 2023-07-25 DIAGNOSIS — I700 Atherosclerosis of aorta: Secondary | ICD-10-CM | POA: Insufficient documentation

## 2023-07-25 DIAGNOSIS — N186 End stage renal disease: Secondary | ICD-10-CM | POA: Diagnosis not present

## 2023-07-25 DIAGNOSIS — I10 Essential (primary) hypertension: Secondary | ICD-10-CM

## 2023-07-25 DIAGNOSIS — S0101XA Laceration without foreign body of scalp, initial encounter: Secondary | ICD-10-CM | POA: Diagnosis not present

## 2023-07-25 LAB — CBC
HCT: 25 % — ABNORMAL LOW (ref 39.0–52.0)
Hemoglobin: 8.6 g/dL — ABNORMAL LOW (ref 13.0–17.0)
MCH: 34 pg (ref 26.0–34.0)
MCHC: 34.4 g/dL (ref 30.0–36.0)
MCV: 98.8 fL (ref 80.0–100.0)
Platelets: 245 10*3/uL (ref 150–400)
RBC: 2.53 MIL/uL — ABNORMAL LOW (ref 4.22–5.81)
RDW: 15.1 % (ref 11.5–15.5)
WBC: 8.7 10*3/uL (ref 4.0–10.5)
nRBC: 0 % (ref 0.0–0.2)

## 2023-07-25 LAB — BASIC METABOLIC PANEL
Anion gap: 13 (ref 5–15)
BUN: 42 mg/dL — ABNORMAL HIGH (ref 6–20)
CO2: 26 mmol/L (ref 22–32)
Calcium: 7.9 mg/dL — ABNORMAL LOW (ref 8.9–10.3)
Chloride: 96 mmol/L — ABNORMAL LOW (ref 98–111)
Creatinine, Ser: 9.55 mg/dL — ABNORMAL HIGH (ref 0.61–1.24)
GFR, Estimated: 6 mL/min — ABNORMAL LOW (ref >60)
Glucose, Bld: 106 mg/dL — ABNORMAL HIGH (ref 70–99)
Potassium: 4.4 mmol/L (ref 3.5–5.1)
Sodium: 135 mmol/L (ref 135–145)

## 2023-07-25 LAB — GLUCOSE, CAPILLARY: Glucose-Capillary: 111 mg/dL — ABNORMAL HIGH (ref 70–99)

## 2023-07-25 MED ORDER — CHLORHEXIDINE GLUCONATE CLOTH 2 % EX PADS
6.0000 | MEDICATED_PAD | Freq: Every day | CUTANEOUS | Status: DC
  Administered 2023-07-27 – 2023-07-29 (×3): 6 via TOPICAL

## 2023-07-25 NOTE — ED Notes (Signed)
 Patient transported to CT

## 2023-07-25 NOTE — ED Notes (Signed)
 Pt placement informed pt has returned to dialysis. Confirmed pt had bed request.

## 2023-07-25 NOTE — Progress Notes (Addendum)
 1951 This RN paged the after hours IM pager ([phone]) in regards to patient stating that "my head just doesn't feel right and I feel like I am having slurred speech." This RN did a neuro assessment and pt had clear speech without slurs.   2035 RN called the weekend pager, Dr. Carlynn Purl stated they would come up and see the patient.
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
## 2023-07-25 NOTE — ED Notes (Signed)
IM providers at bedside

## 2023-07-25 NOTE — ED Notes (Signed)
 Pt return to ED from dialysis. Alert and oriented to person, place, and situation. Pt disoriented to time.

## 2023-07-25 NOTE — Progress Notes (Signed)
 Received patient in bed to unit.  Alert and oriented.  Informed consent signed and in chart.   TX duration:3;30  Patient tolerated well.  Transported back to the room  Alert, without acute distress.  Hand-off given to patient's nurse.   Access used: fistula Access issues: none  Total UF removed: 2500 Medication(s) given: 0 Post HD VS: 121/79 Post HD weight: on stretcher     07/25/23 0039  Vitals  Temp 98.2 F (36.8 C)  Temp Source Oral  BP 121/79  MAP (mmHg) 91  BP Location Left Arm  BP Method Automatic  Patient Position (if appropriate) Lying  Pulse Rate 84  Pulse Rate Source Monitor  ECG Heart Rate 84  Resp 17  Oxygen Therapy  SpO2 100 %  O2 Device Room Air  Patient Activity (if Appropriate) In bed  Pulse Oximetry Type Continuous  During Treatment Monitoring  Blood Flow Rate (mL/min) 0 mL/min  Arterial Pressure (mmHg) -1.41 mmHg  Venous Pressure (mmHg) -1.21 mmHg  TMP (mmHg) -53.13 mmHg  Ultrafiltration Rate (mL/min) 0 mL/min  Dialysate Flow Rate (mL/min) 0 ml/min  Duration of HD Treatment -hour(s) 3.45 hour(s)  Cumulative Fluid Removed (mL) per Treatment  2531.05  Post Treatment  Dialyzer Clearance Lightly streaked  Hemodialysis Intake (mL) 400 mL  Liters Processed 82.1  Fluid Removed (mL) 2500 mL  Tolerated HD Treatment No (Comment)  Post-Hemodialysis Comments HD tx successfuly completed, not tolerated well, pt lost consciouness twice during the treatment due to low BP, but quitly regained concsciousness after 200 cc NS administered.  pt alert, oriented, and verbally responsive. pt denies pain, SOB  AVG/AVF Arterial Site Held (minutes) 10 minutes  AVG/AVF Venous Site Held (minutes) 10 minutes  Note  Patient Observations pt is on the stretcher resing  Fistula / Graft Right Other (Comment) Arteriovenous fistula  Placement Date/Time: 07/08/22 4098   Placed prior to admission: No  Orientation: Right  Access Location: (c) Other (Comment)  Access Type:  Arteriovenous fistula  Site Condition No complications  Fistula / Graft Assessment Present;Thrill;Bruit  Status Deaccessed  Drainage Description None
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
## 2023-07-25 NOTE — ED Notes (Addendum)
 Discussed neuro assessment findings with admitting floor coverage for this RNs initial assessment. Pt is currently having slurred speech and is confused to time. Pt has now gone and returned from CT. Unknown LKN. Admitting provider to assess pt.

## 2023-07-25 NOTE — ED Notes (Signed)
 Pt admitted to [facility]

## 2023-07-25 NOTE — ED Notes (Signed)
 Breakfast tray ordered

## 2023-07-25 NOTE — ED Notes (Signed)
 Pt returned from Dialysis. Pt has pending CT not completed. CT technician called regarding concern for pending CT. Per tech, pt is next.

## 2023-07-25 NOTE — Plan of Care (Signed)

## 2023-07-26 ENCOUNTER — Other Ambulatory Visit (HOSPITAL_COMMUNITY): Payer: Self-pay

## 2023-07-26 DIAGNOSIS — Z992 Dependence on renal dialysis: Secondary | ICD-10-CM

## 2023-07-26 DIAGNOSIS — N186 End stage renal disease: Secondary | ICD-10-CM | POA: Diagnosis not present

## 2023-07-26 DIAGNOSIS — S065XAA Traumatic subdural hemorrhage with loss of consciousness status unknown, initial encounter: Secondary | ICD-10-CM | POA: Diagnosis not present

## 2023-07-26 LAB — BASIC METABOLIC PANEL
Anion gap: 18 — ABNORMAL HIGH (ref 5–15)
BUN: 66 mg/dL — ABNORMAL HIGH (ref 6–20)
CO2: 24 mmol/L (ref 22–32)
Calcium: 7.7 mg/dL — ABNORMAL LOW (ref 8.9–10.3)
Chloride: 94 mmol/L — ABNORMAL LOW (ref 98–111)
Creatinine, Ser: 12.59 mg/dL — ABNORMAL HIGH (ref 0.61–1.24)
GFR, Estimated: 4 mL/min — ABNORMAL LOW (ref >60)
Glucose, Bld: 102 mg/dL — ABNORMAL HIGH (ref 70–99)
Potassium: 4.6 mmol/L (ref 3.5–5.1)
Sodium: 136 mmol/L (ref 135–145)

## 2023-07-26 LAB — CBC
HCT: 23.5 % — ABNORMAL LOW (ref 39.0–52.0)
Hemoglobin: 8.2 g/dL — ABNORMAL LOW (ref 13.0–17.0)
MCH: 34.5 pg — ABNORMAL HIGH (ref 26.0–34.0)
MCHC: 34.9 g/dL (ref 30.0–36.0)
MCV: 98.7 fL (ref 80.0–100.0)
Platelets: 211 10*3/uL (ref 150–400)
RBC: 2.38 MIL/uL — ABNORMAL LOW (ref 4.22–5.81)
RDW: 14.7 % (ref 11.5–15.5)
WBC: 9.1 10*3/uL (ref 4.0–10.5)
nRBC: 0 % (ref 0.0–0.2)

## 2023-07-26 MED ORDER — PENTAFLUOROPROP-TETRAFLUOROETH EX AERO: 1.0000 | INHALATION_SPRAY | CUTANEOUS | Status: DC | PRN

## 2023-07-26 MED ORDER — HEPARIN SODIUM (PORCINE) 5000 UNIT/ML IJ SOLN
5000.0000 [IU] | Freq: Three times a day (TID) | INTRAMUSCULAR | Status: DC
  Administered 2023-07-26 – 2023-07-29 (×9): 5000 [IU] via SUBCUTANEOUS
  Filled 2023-07-26 (×9): qty 1

## 2023-07-26 MED ORDER — LACTATED RINGERS IV BOLUS
500.0000 mL | Freq: Once | INTRAVENOUS | Status: AC
  Administered 2023-07-26: 500 mL via INTRAVENOUS

## 2023-07-26 MED ORDER — ISOSORBIDE MONONITRATE ER 30 MG PO TB24
30.0000 mg | ORAL_TABLET | Freq: Every day | ORAL | Status: DC
  Administered 2023-07-26 – 2023-07-29 (×3): 30 mg via ORAL
  Filled 2023-07-26 (×4): qty 1

## 2023-07-26 MED ORDER — HEPARIN SODIUM (PORCINE) 1000 UNIT/ML DIALYSIS: 1000.0000 [IU] | INTRAMUSCULAR | Status: DC | PRN

## 2023-07-26 MED ORDER — ISOSORBIDE MONONITRATE ER 30 MG PO TB24
30.0000 mg | ORAL_TABLET | Freq: Every day | ORAL | 0 refills | Status: DC
  Filled 2023-07-26: qty 60, 60d supply, fill #0

## 2023-07-26 NOTE — Progress Notes (Signed)
 PT Cancellation Note  Patient Details Name: Paul [name] MRN: [id number] DOB: 1965/07/24   Cancelled Treatment:    Reason Eval/Treat Not Completed: Patient at procedure or test/unavailable - off floor at HD, will check back as able.   Marye Round, PT DPT Acute Rehabilitation Services Secure Chat Preferred  Office [phone]    Truddie Coco 07/26/2023, 9:16 AM
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
## 2023-07-26 NOTE — Progress Notes (Signed)
 Patient left unit to dialysis at this time,. CCMD notified.

## 2023-07-26 NOTE — Progress Notes (Signed)
 Received patient in bed to unit.  Alert and oriented.  Informed consent signed and in chart.   TX duration:3 hours   Patient tolerated well.  Transported back to the room  Alert, without acute distress.  Hand-off given to patient's nurse.   Access used: Right upper arm AVF Access issues: None  Total UF removed: 2.1 Medication(s) given: See [facility]   07/26/23 1220  Vitals  Temp 97.6 F (36.4 C)  Pulse Rate 87  Resp 16  BP 125/80  SpO2 96 %  Weight 79.9 kg  Type of Weight Post-Dialysis  Oxygen Therapy  Patient Activity (if Appropriate) In bed  Pulse Oximetry Type Continuous  Oximetry Probe Site Changed No  Post Treatment  Dialyzer Clearance Clear  Liters Processed 79.3  Fluid Removed (mL) 2.1 mL  Tolerated HD Treatment Yes

## 2023-07-26 NOTE — Progress Notes (Signed)
   07/26/23 1247  Vitals  Temp 97.6 F (36.4 C)  Temp Source Oral  BP (!) 140/84  MAP (mmHg) 101  BP Location Left Arm  BP Method Automatic  Patient Position (if appropriate) Lying  Pulse Rate 88  Pulse Rate Source Monitor  Resp 18  Level of Consciousness  Level of Consciousness Alert  MEWS COLOR  MEWS Score Color Green  Oxygen Therapy  SpO2 97 %  O2 Device Room Air  Pain Assessment  Pain Scale 0-10  Pain Score 0   Patient returned from HD
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
## 2023-07-26 NOTE — Discharge Instructions (Signed)
 Mr. Paul [name], It was a pleasure taking care of you at [facility]. You were admitted for subdural hematoma and treated for this.  Hemodialysis while you are here. We are discharging you home now that you are doing better. Please follow the following instructions.   1) Regarding your subdural hematoma, this has improved.  Follow-up with your primary care physician for this.  If you develop any neurological symptoms, please return back to the emergency department.  2) Regarding your dialysis, continue to come to the emergency department to get dialysis until you get outpatient dialysis center set up.  3) Please follow-up with your primary care physician in about 1 week.  4) Please continue taking Imdur daily.  Please continue taking all of your other medications as prescribed.  Take care,  Dr. Modena Slater, DO

## 2023-07-26 NOTE — Plan of Care (Signed)
  Problem: Education: Goal: Knowledge of General Education information will improve Description: Including pain rating scale, medication(s)/side effects and non-pharmacologic comfort measures 07/26/2023 2211 by Carlene Coria, RN Outcome: Progressing 07/26/2023 2046 by Carlene Coria, RN Outcome: Progressing   Problem: Health Behavior/Discharge Planning: Goal: Ability to manage health-related needs will improve 07/26/2023 2211 by Carlene Coria, RN Outcome: Progressing 07/26/2023 2046 by Carlene Coria, RN Outcome: Progressing   Problem: Clinical Measurements: Goal: Ability to maintain clinical measurements within normal limits will improve 07/26/2023 2211 by Carlene Coria, RN Outcome: Progressing 07/26/2023 2046 by Carlene Coria, RN Outcome: Progressing Goal: Will remain free from infection 07/26/2023 2211 by Carlene Coria, RN Outcome: Progressing 07/26/2023 2046 by Carlene Coria, RN Outcome: Progressing Goal: Diagnostic test results will improve 07/26/2023 2211 by Carlene Coria, RN Outcome: Progressing 07/26/2023 2046 by Carlene Coria, RN Outcome: Progressing Goal: Respiratory complications will improve 07/26/2023 2211 by Carlene Coria, RN Outcome: Progressing 07/26/2023 2046 by Carlene Coria, RN Outcome: Progressing Goal: Cardiovascular complication will be avoided 07/26/2023 2211 by Carlene Coria, RN Outcome: Progressing 07/26/2023 2046 by Carlene Coria, RN Outcome: Progressing   Problem: Activity: Goal: Risk for activity intolerance will decrease 07/26/2023 2211 by Carlene Coria, RN Outcome: Progressing 07/26/2023 2046 by Carlene Coria, RN Outcome: Progressing   Problem: Nutrition: Goal: Adequate nutrition will be maintained 07/26/2023 2211 by Carlene Coria, RN Outcome: Progressing 07/26/2023 2046 by Carlene Coria, RN Outcome: Progressing   Problem: Coping: Goal: Level of anxiety will  decrease 07/26/2023 2211 by Carlene Coria, RN Outcome: Progressing 07/26/2023 2046 by Carlene Coria, RN Outcome: Progressing   Problem: Elimination: Goal: Will not experience complications related to bowel motility 07/26/2023 2211 by Carlene Coria, RN Outcome: Progressing 07/26/2023 2046 by Carlene Coria, RN Outcome: Progressing Goal: Will not experience complications related to urinary retention 07/26/2023 2211 by Carlene Coria, RN Outcome: Progressing 07/26/2023 2046 by Carlene Coria, RN Outcome: Progressing   Problem: Pain Managment: Goal: General experience of comfort will improve and/or be controlled 07/26/2023 2211 by Carlene Coria, RN Outcome: Progressing 07/26/2023 2046 by Carlene Coria, RN Outcome: Progressing   Problem: Safety: Goal: Ability to remain free from injury will improve 07/26/2023 2211 by Carlene Coria, RN Outcome: Progressing 07/26/2023 2046 by Carlene Coria, RN Outcome: Progressing   Problem: Skin Integrity: Goal: Risk for impaired skin integrity will decrease 07/26/2023 2211 by Carlene Coria, RN Outcome: Progressing 07/26/2023 2046 by Carlene Coria, RN Outcome: Progressing

## 2023-07-27 DIAGNOSIS — S065XAA Traumatic subdural hemorrhage with loss of consciousness status unknown, initial encounter: Secondary | ICD-10-CM | POA: Diagnosis not present

## 2023-07-27 LAB — CBC
HCT: 26.7 % — ABNORMAL LOW (ref 39.0–52.0)
Hemoglobin: 9.2 g/dL — ABNORMAL LOW (ref 13.0–17.0)
MCH: 34.3 pg — ABNORMAL HIGH (ref 26.0–34.0)
MCHC: 34.5 g/dL (ref 30.0–36.0)
MCV: 99.6 fL (ref 80.0–100.0)
Platelets: 245 10*3/uL (ref 150–400)
RBC: 2.68 MIL/uL — ABNORMAL LOW (ref 4.22–5.81)
RDW: 14.6 % (ref 11.5–15.5)
WBC: 10.8 10*3/uL — ABNORMAL HIGH (ref 4.0–10.5)
nRBC: 0 % (ref 0.0–0.2)

## 2023-07-27 LAB — RENAL FUNCTION PANEL
Albumin: 3.5 g/dL (ref 3.5–5.0)
Anion gap: 15 (ref 5–15)
BUN: 45 mg/dL — ABNORMAL HIGH (ref 6–20)
CO2: 26 mmol/L (ref 22–32)
Calcium: 8.1 mg/dL — ABNORMAL LOW (ref 8.9–10.3)
Chloride: 92 mmol/L — ABNORMAL LOW (ref 98–111)
Creatinine, Ser: 9.3 mg/dL — ABNORMAL HIGH (ref 0.61–1.24)
GFR, Estimated: 6 mL/min — ABNORMAL LOW (ref >60)
Glucose, Bld: 98 mg/dL (ref 70–99)
Phosphorus: 6.9 mg/dL — ABNORMAL HIGH (ref 2.5–4.6)
Potassium: 4.4 mmol/L (ref 3.5–5.1)
Sodium: 133 mmol/L — ABNORMAL LOW (ref 135–145)
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
## 2023-07-28 DIAGNOSIS — W228XXA Striking against or struck by other objects, initial encounter: Secondary | ICD-10-CM

## 2023-07-28 DIAGNOSIS — S065XAA Traumatic subdural hemorrhage with loss of consciousness status unknown, initial encounter: Secondary | ICD-10-CM | POA: Diagnosis not present

## 2023-07-28 LAB — RENAL FUNCTION PANEL
Albumin: 3.4 g/dL — ABNORMAL LOW (ref 3.5–5.0)
Anion gap: 16 — ABNORMAL HIGH (ref 5–15)
BUN: 80 mg/dL — ABNORMAL HIGH (ref 6–20)
CO2: 26 mmol/L (ref 22–32)
Calcium: 7.9 mg/dL — ABNORMAL LOW (ref 8.9–10.3)
Chloride: 91 mmol/L — ABNORMAL LOW (ref 98–111)
Creatinine, Ser: 12.92 mg/dL — ABNORMAL HIGH (ref 0.61–1.24)
GFR, Estimated: 4 mL/min — ABNORMAL LOW (ref >60)
Glucose, Bld: 91 mg/dL (ref 70–99)
Phosphorus: 7.9 mg/dL — ABNORMAL HIGH (ref 2.5–4.6)
Potassium: 4.7 mmol/L (ref 3.5–5.1)
Sodium: 133 mmol/L — ABNORMAL LOW (ref 135–145)

## 2023-07-28 LAB — HEPATITIS B SURFACE ANTIGEN: Hepatitis B Surface Ag: NONREACTIVE

## 2023-07-28 MED ORDER — AMOXICILLIN-POT CLAVULANATE 500-125 MG PO TABS
1.0000 | ORAL_TABLET | Freq: Two times a day (BID) | ORAL | Status: DC
  Administered 2023-07-28 – 2023-07-29 (×3): 1 via ORAL
  Filled 2023-07-28 (×4): qty 1

## 2023-07-28 MED ORDER — CARVEDILOL 6.25 MG PO TABS
6.2500 mg | ORAL_TABLET | Freq: Two times a day (BID) | ORAL | Status: DC
  Administered 2023-07-28 – 2023-07-29 (×2): 6.25 mg via ORAL
  Filled 2023-07-28 (×2): qty 1
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
## 2023-07-28 NOTE — Progress Notes (Signed)
 Trauma Event Note   Declines ITSS, states he does not respect psychiatry and is fine.   Last imported Vital Signs BP 134/74 (BP Location: Left Arm)   Pulse (!) 102   Temp 98.3 F (36.8 C) (Oral)   Resp 18   Ht 5\' 3"  (1.6 m)   Wt 171 lb 11.8 oz (77.9 kg)   SpO2 94%   BMI 30.42 kg/m   Trending CBC Recent Labs    07/26/23 0341 07/27/23 0441  WBC 9.1 10.8*  HGB 8.2* 9.2*  HCT 23.5* 26.7*  PLT 211 245    Trending Coag's No results for input(s): "APTT", "INR" in the last 72 hours.  Trending BMET Recent Labs    07/26/23 0341 07/27/23 0441 07/28/23 0743  NA 136 133* 133*  K 4.6 4.4 4.7  CL 94* 92* 91*  CO2 24 26 26   BUN 66* 45* 80*  CREATININE 12.59* 9.30* 12.92*  GLUCOSE 102* 98 91      Shani Fitch O Hilliard Borges  Trauma Response RN  Please call TRN at [phone] for further assistance.

## 2023-07-28 NOTE — Plan of Care (Signed)

## 2023-07-28 NOTE — Care Management Important Message (Signed)
 Important Message  Patient Details  Name: Paul [name] MRN: [id number] Date of Birth: 02-03-1966   Important Message Given:  Yes - Medicare IM     Sherilyn Banker 07/28/2023, 12:40 PM
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
## 2023-07-29 ENCOUNTER — Other Ambulatory Visit (HOSPITAL_COMMUNITY): Payer: Self-pay

## 2023-07-29 ENCOUNTER — Telehealth (HOSPITAL_COMMUNITY): Payer: Self-pay | Admitting: Pharmacy Technician

## 2023-07-29 LAB — CBC
HCT: 22.4 % — ABNORMAL LOW (ref 39.0–52.0)
Hemoglobin: 8.2 g/dL — ABNORMAL LOW (ref 13.0–17.0)
MCH: 35.5 pg — ABNORMAL HIGH (ref 26.0–34.0)
MCHC: 36.6 g/dL — ABNORMAL HIGH (ref 30.0–36.0)
MCV: 97 fL (ref 80.0–100.0)
Platelets: 240 10*3/uL (ref 150–400)
RBC: 2.31 MIL/uL — ABNORMAL LOW (ref 4.22–5.81)
RDW: 14.2 % (ref 11.5–15.5)
WBC: 12.4 10*3/uL — ABNORMAL HIGH (ref 4.0–10.5)
nRBC: 0 % (ref 0.0–0.2)

## 2023-07-29 LAB — BASIC METABOLIC PANEL
Anion gap: 15 (ref 5–15)
BUN: 101 mg/dL — ABNORMAL HIGH (ref 6–20)
CO2: 24 mmol/L (ref 22–32)
Calcium: 7.6 mg/dL — ABNORMAL LOW (ref 8.9–10.3)
Chloride: 93 mmol/L — ABNORMAL LOW (ref 98–111)
Creatinine, Ser: 15.05 mg/dL — ABNORMAL HIGH (ref 0.61–1.24)
GFR, Estimated: 3 mL/min — ABNORMAL LOW (ref >60)
Glucose, Bld: 129 mg/dL — ABNORMAL HIGH (ref 70–99)
Potassium: 4.7 mmol/L (ref 3.5–5.1)
Sodium: 132 mmol/L — ABNORMAL LOW (ref 135–145)

## 2023-07-29 LAB — HEPATITIS B SURFACE ANTIBODY, QUANTITATIVE: Hep B S AB Quant (Post): 12001 m[IU]/mL

## 2023-07-29 MED ORDER — AMOXICILLIN-POT CLAVULANATE 500-125 MG PO TABS
1.0000 | ORAL_TABLET | Freq: Two times a day (BID) | ORAL | 0 refills | Status: AC
  Filled 2023-07-29: qty 7, 4d supply, fill #0

## 2023-07-29 MED ORDER — SUCROFERRIC OXYHYDROXIDE 500 MG PO CHEW
1500.0000 mg | CHEWABLE_TABLET | Freq: Three times a day (TID) | ORAL | 0 refills | Status: AC
  Filled 2023-07-29 – 2023-07-31 (×2): qty 90, 10d supply, fill #0

## 2023-07-29 MED ORDER — CARVEDILOL 6.25 MG PO TABS
6.2500 mg | ORAL_TABLET | Freq: Two times a day (BID) | ORAL | 0 refills | Status: DC
  Filled 2023-07-29: qty 60, 30d supply, fill #0

## 2023-07-29 MED ORDER — ISOSORBIDE MONONITRATE ER 30 MG PO TB24
30.0000 mg | ORAL_TABLET | Freq: Every day | ORAL | 0 refills | Status: DC
  Filled 2023-07-29: qty 30, 30d supply, fill #0

## 2023-07-29 MED ORDER — LIDOCAINE HCL (PF) 1 % IJ SOLN: 5.0000 mL | INTRAMUSCULAR | Status: DC | PRN

## 2023-07-29 NOTE — Discharge Summary (Addendum)
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 Medicine Resident, PGY-1 [facility]  Pager: [phone] 2:07 PM, 07/29/2023
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
## 2023-07-29 NOTE — Progress Notes (Signed)
 Received patient in bed ,alert and oriented x 4 Consent verified.  Access used :Right AVF that worked well.  Duration of treatment: 3.5 hours.  Net UF:  2L  Hand off to the patient's nurse via transporter with stable medical condition.
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
## 2023-07-29 NOTE — TOC CM/SW Note (Signed)
 Referral received regarding patient needing new outpt HD clinic. Per renal navigator this is a very complex situation. Renal navigator is working on sending out referrals, but due to pt's history and safety concerns of staff/patients it is not likely that pt will be accepted at a new center.  Renal Navigator to follow.

## 2023-07-29 NOTE — Telephone Encounter (Signed)
 Pharmacy Patient Advocate Encounter   Received notification that prior authorization for Velphoro 500MG  chewable tablets is required/requested.   Insurance verification completed.   The patient is insured through [employer] .   Per test claim: PA required; PA submitted to above mentioned insurance via CoverMyMeds Key/confirmation #/EOC [id number] Status is pending

## 2023-07-29 NOTE — Procedures (Signed)
 I was present at this dialysis session. I have reviewed the session itself and made appropriate changes.   Vital signs in last 24 hours:  Temp:  [97.8 F (36.6 C)-98.7 F (37.1 C)] 98.1 F (36.7 C) (03/25 0759) Pulse Rate:  [78-102] 78 (03/25 0830) Resp:  [11-19] 12 (03/25 0830) BP: (134-159)/(74-94) 146/86 (03/25 0830) SpO2:  [94 %-100 %] 99 % (03/25 0830) Weight:  [77.9 kg-78.3 kg] 78.3 kg (03/25 0759) Weight change: 0 kg Filed Weights   07/28/23 0307 07/29/23 0500 07/29/23 0759  Weight: 77.9 kg 77.9 kg 78.3 kg    Recent Labs  Lab 07/28/23 0743 07/29/23 0505  NA 133* 132*  K 4.7 4.7  CL 91* 93*  CO2 26 24  GLUCOSE 91 129*  BUN 80* 101*  CREATININE 12.92* 15.05*  CALCIUM 7.9* 7.6*  PHOS 7.9*  --     Recent Labs  Lab 07/23/23 1020 07/24/23 1147 07/26/23 0341 07/27/23 0441 07/29/23 0505  WBC 7.6   < > 9.1 10.8* 12.4*  NEUTROABS 5.3  --   --   --   --   HGB 8.9*   < > 8.2* 9.2* 8.2*  HCT 26.5*   < > 23.5* 26.7* 22.4*  MCV 101.5*   < > 98.7 99.6 97.0  PLT 253   < > 211 245 240   < > = values in this interval not displayed.    Scheduled Meds:  amoxicillin-clavulanate  1 tablet Oral BID   carvedilol  6.25 mg Oral BID WC   Chlorhexidine Gluconate Cloth  6 each Topical Q0600   Chlorhexidine Gluconate Cloth  6 each Topical Q0600   Chlorhexidine Gluconate Cloth  6 each Topical Q0600   heparin injection (subcutaneous)  5,000 Units Subcutaneous Q8H   isosorbide mononitrate  30 mg Oral Daily   rosuvastatin  5 mg Oral Daily   sucroferric oxyhydroxide  1,500 mg Oral TID WC   Continuous Infusions: PRN Meds:.acetaminophen **OR** acetaminophen, heparin, labetalol, pentafluoroprop-tetrafluoroeth, polyethylene glycol   Irena Cords,  MD 07/29/2023, 8:31 AM
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
## 2023-07-29 NOTE — Progress Notes (Signed)
 Patient discharging home per MD order. Reviewed all discharge instructions, medications, and follow up appointment with patient and patient states understanding. All questions answered. Plan is for patient to come to the Emergency Department for his dialysis treatments, patient aware and agreeable of plan. Patient initially planned to take the bus home but his Paul [name] came to visit on the way out to the bus stop and is going to take patient home. Patient discharged in no acute distress.

## 2023-07-30 ENCOUNTER — Telehealth: Payer: Self-pay | Admitting: *Deleted

## 2023-07-30 ENCOUNTER — Telehealth: Payer: Self-pay

## 2023-07-30 NOTE — Telephone Encounter (Signed)
 Pharmacy Patient Advocate Encounter  Received notification from [facility] that Prior Authorization for Velphoro 500MG  chewable tablets has been Denied through Part D but Approved through Part B.   PA #/Case ID/Reference #: [id number]

## 2023-07-30 NOTE — Transitions of Care (Post Inpatient/ED Visit) (Signed)
   07/30/2023  Name: Paul [name] MRN: [id number] DOB: [birth year]  Today's TOC FU Call Status: Today's TOC FU Call Status:: Successful TOC FU Call Completed TOC FU Call Complete Date: 07/30/23 Patient's Name and Date of Birth confirmed.  Transition Care Management Follow-up Telephone Call Date of Discharge: 07/29/23 Discharge Facility: [facility]) Type of Discharge: Inpatient Admission Primary Inpatient Discharge Diagnosis:: subdural hematoma How have you been since you were released from the hospital?: Better Any questions or concerns?: No  Items Reviewed: Did you receive and understand the discharge instructions provided?: Yes Medications obtained,verified, and reconciled?: No Medications Not Reviewed Reasons:: Other: (He was not sure if he has all of his medications. I asked him if he wanted to review the list and he did not want to review. I instructed him to bring all of his medications to his appoinment at [facility] tomorrow for the provider to reivew.) Any new allergies since your discharge?: No Dietary orders reviewed?: Yes Type of Diet Ordered:: low sodium heart healthy Do you have support at home?: Yes Name of Support/Comfort Primary Source: He said there are other men available to assist him  Medications Reviewed Today: Medications Reviewed Today   Medications were not reviewed in this encounter     Home Care and Equipment/Supplies: Were Home Health Services Ordered?: No Any new equipment or medical supplies ordered?: No  Functional Questionnaire: Do you need assistance with bathing/showering or dressing?: No Do you need assistance with meal preparation?: No Do you need assistance with eating?: No Do you have difficulty maintaining continence: No Do you need assistance with getting out of bed/getting out of a chair/moving?: No Do you have difficulty managing or taking your medications?: No  Follow up appointments reviewed: PCP Follow-up appointment  confirmed?: Yes Date of PCP follow-up appointment?: 07/31/23 Follow-up Provider: Dr Laural Benes.  He said she is supposed to remove the staples from his head.  I told him that Dr Laural Benes will assess the wound and determine if they are going to be removed tomorrow.  his only instructions about staple removal is to follow up with outpatient provider Specialist Hospital Follow-up appointment confirmed?: Yes Follow-Up Specialty Provider:: He is supposed to attend dialysis MWF at the ED but he said he did not go today Do you need transportation to your follow-up appointment?: No (He has used Medicaid transportation.) Do you understand care options if your condition(s) worsen?: Yes-patient verbalized understanding    SIGNATURE Robyne Peers, RN

## 2023-07-30 NOTE — Progress Notes (Signed)
 Complex Care Management Care Guide Note  07/30/2023 Name: Paul [name] MRN: [id number] DOB: 05-15-1965  Paul [name] is a 58 y.o. year old male who is a primary care patient of Marcine Matar, MD and is actively engaged with the care management team. I reached out to Paul [name] by phone today to assist with re-scheduling  with the RN Case Manager.  Follow up plan: Unsuccessful telephone outreach attempt made.   Gwenevere Ghazi  [facility]  [facility], [facility] Guide  Direct Dial: [phone]  Fax [phone]

## 2023-07-31 ENCOUNTER — Ambulatory Visit: Attending: Internal Medicine | Admitting: Internal Medicine

## 2023-07-31 ENCOUNTER — Other Ambulatory Visit (HOSPITAL_COMMUNITY): Payer: Self-pay

## 2023-07-31 ENCOUNTER — Encounter: Payer: Self-pay | Admitting: Internal Medicine

## 2023-07-31 VITALS — BP 150/84 | HR 77 | Temp 98.0°F | Ht 63.0 in | Wt 173.0 lb

## 2023-07-31 DIAGNOSIS — S0101XA Laceration without foreign body of scalp, initial encounter: Secondary | ICD-10-CM | POA: Diagnosis not present

## 2023-07-31 DIAGNOSIS — S065XAA Traumatic subdural hemorrhage with loss of consciousness status unknown, initial encounter: Secondary | ICD-10-CM

## 2023-07-31 DIAGNOSIS — Z992 Dependence on renal dialysis: Secondary | ICD-10-CM

## 2023-07-31 DIAGNOSIS — I5020 Unspecified systolic (congestive) heart failure: Secondary | ICD-10-CM | POA: Diagnosis not present

## 2023-07-31 DIAGNOSIS — I130 Hypertensive heart and chronic kidney disease with heart failure and stage 1 through stage 4 chronic kidney disease, or unspecified chronic kidney disease: Secondary | ICD-10-CM | POA: Diagnosis not present

## 2023-07-31 DIAGNOSIS — Z09 Encounter for follow-up examination after completed treatment for conditions other than malignant neoplasm: Secondary | ICD-10-CM

## 2023-07-31 DIAGNOSIS — Z2821 Immunization not carried out because of patient refusal: Secondary | ICD-10-CM

## 2023-07-31 DIAGNOSIS — N186 End stage renal disease: Secondary | ICD-10-CM

## 2023-07-31 DIAGNOSIS — Z4802 Encounter for removal of sutures: Secondary | ICD-10-CM

## 2023-07-31 DIAGNOSIS — D631 Anemia in chronic kidney disease: Secondary | ICD-10-CM | POA: Diagnosis not present

## 2023-07-31 NOTE — Patient Instructions (Signed)
 Your blood pressure on repeat check has improved  but not at goal.  Please take your blood pressure medications when you return home.  Sutures have been removed today.  Please let us know if you develop any headaches or has any further vomiting.  Clean the area over the laceration with warm water and clean cloth.  Please complete the course of antibiotics that was prescribed.
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
## 2023-08-01 ENCOUNTER — Emergency Department (HOSPITAL_COMMUNITY)
Admission: EM | Admit: 2023-08-01 | Discharge: 2023-08-01 | Disposition: A | Discharge status: Home / Self Care | Attending: Emergency Medicine | Admitting: Emergency Medicine

## 2023-08-01 ENCOUNTER — Other Ambulatory Visit: Payer: Self-pay

## 2023-08-01 ENCOUNTER — Other Ambulatory Visit (HOSPITAL_COMMUNITY): Payer: Self-pay

## 2023-08-01 DIAGNOSIS — N186 End stage renal disease: Secondary | ICD-10-CM | POA: Diagnosis not present

## 2023-08-01 DIAGNOSIS — I120 Hypertensive chronic kidney disease with stage 5 chronic kidney disease or end stage renal disease: Secondary | ICD-10-CM | POA: Diagnosis not present

## 2023-08-01 DIAGNOSIS — Z992 Dependence on renal dialysis: Secondary | ICD-10-CM | POA: Diagnosis present

## 2023-08-01 LAB — CBC
HCT: 22.9 % — ABNORMAL LOW (ref 39.0–52.0)
Hemoglobin: 7.8 g/dL — ABNORMAL LOW (ref 13.0–17.0)
MCH: 34.5 pg — ABNORMAL HIGH (ref 26.0–34.0)
MCHC: 34.1 g/dL (ref 30.0–36.0)
MCV: 101.3 fL — ABNORMAL HIGH (ref 80.0–100.0)
Platelets: 269 10*3/uL (ref 150–400)
RBC: 2.26 MIL/uL — ABNORMAL LOW (ref 4.22–5.81)
RDW: 14 % (ref 11.5–15.5)
WBC: 8.2 10*3/uL (ref 4.0–10.5)
nRBC: 0 % (ref 0.0–0.2)

## 2023-08-01 LAB — RENAL FUNCTION PANEL
Albumin: 3.4 g/dL — ABNORMAL LOW (ref 3.5–5.0)
Anion gap: 20 — ABNORMAL HIGH (ref 5–15)
BUN: 103 mg/dL — ABNORMAL HIGH (ref 6–20)
CO2: 23 mmol/L (ref 22–32)
Calcium: 7.5 mg/dL — ABNORMAL LOW (ref 8.9–10.3)
Chloride: 94 mmol/L — ABNORMAL LOW (ref 98–111)
Creatinine, Ser: 14.92 mg/dL — ABNORMAL HIGH (ref 0.61–1.24)
GFR, Estimated: 3 mL/min — ABNORMAL LOW (ref >60)
Glucose, Bld: 102 mg/dL — ABNORMAL HIGH (ref 70–99)
Phosphorus: 11.4 mg/dL — ABNORMAL HIGH (ref 2.5–4.6)
Potassium: 4.6 mmol/L (ref 3.5–5.1)
Sodium: 137 mmol/L (ref 135–145)

## 2023-08-01 MED ORDER — PENTAFLUOROPROP-TETRAFLUOROETH EX AERO: 1.0000 | INHALATION_SPRAY | CUTANEOUS | Status: DC | PRN

## 2023-08-01 MED ORDER — ALTEPLASE 2 MG IJ SOLR: 2.0000 mg | Freq: Once | INTRAMUSCULAR | Status: DC | PRN

## 2023-08-01 MED ORDER — NEPRO/CARBSTEADY PO LIQD
237.0000 mL | ORAL | Status: DC | PRN
  Filled 2023-08-01: qty 237

## 2023-08-01 MED ORDER — LIDOCAINE HCL (PF) 1 % IJ SOLN: 5.0000 mL | INTRAMUSCULAR | Status: DC | PRN

## 2023-08-01 MED ORDER — ANTICOAGULANT SODIUM CITRATE 4% (200MG/5ML) IV SOLN
5.0000 mL | Status: DC | PRN
  Filled 2023-08-01: qty 5

## 2023-08-01 MED ORDER — CHLORHEXIDINE GLUCONATE CLOTH 2 % EX PADS: 6.0000 | MEDICATED_PAD | Freq: Every day | CUTANEOUS | Status: DC

## 2023-08-01 MED ORDER — LIDOCAINE-PRILOCAINE 2.5-2.5 % EX CREA: 1.0000 | TOPICAL_CREAM | CUTANEOUS | Status: DC | PRN

## 2023-08-01 MED ORDER — HEPARIN SODIUM (PORCINE) 1000 UNIT/ML DIALYSIS: 1000.0000 [IU] | INTRAMUSCULAR | Status: DC | PRN

## 2023-08-01 NOTE — ED Notes (Signed)
Dialysis aware patient is here.

## 2023-08-01 NOTE — ED Provider Notes (Signed)
 [place] EMERGENCY DEPARTMENT AT [facility] Provider Note   CSN: [id number] Arrival date & time: 08/01/23  4098     History  Chief Complaint  Patient presents with   Dialysis Treatment    Paul [name] is a 58 y.o. male.  58 year old male presents questing dialysis treatment.  Patient has no medical complaints at this time.  Denies being short of breath.  No emesis.  Feels at his baseline.  Last dialysis was on Tuesday.       Home Medications Prior to Admission medications   Medication Sig Start Date End Date Taking? Authorizing Provider  amoxicillin-clavulanate (AUGMENTIN) 500-125 MG tablet Take 1 tablet by mouth 2 (two) times daily for 7 doses. 07/29/23 08/02/23  Tawkaliyar, Roya, DO  carvedilol (COREG) 6.25 MG tablet Take 1 tablet (6.25 mg total) by mouth 2 (two) times daily with a meal. 07/29/23 08/28/23  Tawkaliyar, Roya, DO  isosorbide mononitrate (IMDUR) 30 MG 24 hr tablet Take 1 tablet (30 mg total) by mouth daily. 07/29/23   Tawkaliyar, Roya, DO  ketoconazole (NIZORAL) 2 % cream Apply 1 Application topically daily. 07/08/23   Edwin Cap, DPM  lidocaine-prilocaine (EMLA) cream Apply 1 a small amount to skin three times a week apply to AVF 30-60 min prior to dialysis, wrap site w plastic wrap Patient not taking: Reported on 07/08/2023 09/12/22     rosuvastatin (CRESTOR) 5 MG tablet Take 1 tablet (5 mg total) by mouth daily. 03/24/23     sucroferric oxyhydroxide (VELPHORO) 500 MG chewable tablet Chew 3 tablets (1,500 mg total) by mouth 3 (three) times daily with meals. 07/29/23   Tawkaliyar, Laney Potash, DO      Allergies    Penicillin g    Review of Systems   Review of Systems  All other systems reviewed and are negative.   Physical Exam Updated Vital Signs BP (!) 175/95 (BP Location: Left Arm)   Pulse 82   Temp (!) 97.5 F (36.4 C)   Resp 18   SpO2 99%  Physical Exam Vitals and nursing note reviewed.  Constitutional:      General: He is not in acute  distress.    Appearance: Normal appearance. He is well-developed. He is not toxic-appearing.  HENT:     Head: Normocephalic and atraumatic.  Eyes:     General: Lids are normal.     Conjunctiva/sclera: Conjunctivae normal.     Pupils: Pupils are equal, round, and reactive to light.  Neck:     Thyroid: No thyroid mass.     Trachea: No tracheal deviation.  Cardiovascular:     Rate and Rhythm: Normal rate and regular rhythm.     Heart sounds: Normal heart sounds. No murmur heard.    No gallop.  Pulmonary:     Effort: Pulmonary effort is normal. No respiratory distress.     Breath sounds: Normal breath sounds. No stridor. No decreased breath sounds, wheezing, rhonchi or rales.  Abdominal:     General: There is no distension.     Palpations: Abdomen is soft.     Tenderness: There is no abdominal tenderness. There is no rebound.  Musculoskeletal:        General: No tenderness. Normal range of motion.     Cervical back: Normal range of motion and neck supple.  Skin:    General: Skin is warm and dry.     Findings: No abrasion or rash.  Neurological:     Mental Status: He is alert  and oriented to person, place, and time. Mental status is at baseline.     GCS: GCS eye subscore is 4. GCS verbal subscore is 5. GCS motor subscore is 6.     Cranial Nerves: No cranial nerve deficit.     Sensory: No sensory deficit.     Motor: Motor function is intact.  Psychiatric:        Attention and Perception: Attention normal.        Speech: Speech normal.        Behavior: Behavior normal.     ED Results / Procedures / Treatments   Labs (all labs ordered are listed, but only abnormal results are displayed) Labs Reviewed - No data to display  EKG None  Radiology No results found.  Procedures Procedures    Medications Ordered in ED Medications - No data to display  ED Course/ Medical Decision Making/ A&P                                 Medical Decision Making  Will contact  nephrology to arrange dialysis        Final Clinical Impression(s) / ED Diagnoses Final diagnoses:  None    Rx / DC Orders ED Discharge Orders     None         Lorre Nick, MD 08/01/23 [phone]

## 2023-08-01 NOTE — ED Notes (Signed)
 Consent signed.

## 2023-08-01 NOTE — ED Notes (Signed)
 Patient sleeping on stretcher waiting dialysis

## 2023-08-01 NOTE — ED Triage Notes (Signed)
 Pt presents to the ED for his scheduled dialysis. Is currently receiving dialysis MWF here at [facility]. Reports last dialysis Tuesday. No CP/SOB/fluid retention.

## 2023-08-01 NOTE — Procedures (Signed)
 I was present at this dialysis session. I have reviewed the session itself and made appropriate changes.   Vital signs in last 24 hours:  Temp:  [97.5 F (36.4 C)-98 F (36.7 C)] 98 F (36.7 C) (03/28 1430) Pulse Rate:  [77-83] 79 (03/28 1435) Resp:  [13-18] 17 (03/28 1435) BP: (141-175)/(67-98) 149/67 (03/28 1435) SpO2:  [98 %-100 %] 98 % (03/28 1435) Weight:  [80.4 kg] 80.4 kg (03/28 1430) Weight change:  Filed Weights   08/01/23 1430  Weight: 80.4 kg    Recent Labs  Lab 08/01/23 1117  NA 137  K 4.6  CL 94*  CO2 23  GLUCOSE 102*  BUN 103*  CREATININE 14.92*  CALCIUM 7.5*  PHOS 11.4*    Recent Labs  Lab 07/27/23 0441 07/29/23 0505 08/01/23 1117  WBC 10.8* 12.4* 8.2  HGB 9.2* 8.2* 7.8*  HCT 26.7* 22.4* 22.9*  MCV 99.6 97.0 101.3*  PLT 245 240 269    Scheduled Meds:  Chlorhexidine Gluconate Cloth  6 each Topical Q0600   Continuous Infusions:  anticoagulant sodium citrate     PRN Meds:.alteplase, anticoagulant sodium citrate, feeding supplement (NEPRO CARB STEADY), heparin, lidocaine (PF), lidocaine-prilocaine, pentafluoroprop-tetrafluoroeth   Irena Cords,  MD 08/01/2023, 2:57 PM

## 2023-08-04 ENCOUNTER — Emergency Department (HOSPITAL_COMMUNITY)
Admission: EM | Admit: 2023-08-04 | Discharge: 2023-08-04 | Discharge status: Against Medical Advice | Attending: Emergency Medicine | Admitting: Emergency Medicine

## 2023-08-04 ENCOUNTER — Encounter (HOSPITAL_COMMUNITY): Payer: Self-pay

## 2023-08-04 ENCOUNTER — Other Ambulatory Visit: Payer: Self-pay

## 2023-08-04 DIAGNOSIS — Z992 Dependence on renal dialysis: Secondary | ICD-10-CM | POA: Insufficient documentation

## 2023-08-04 DIAGNOSIS — E875 Hyperkalemia: Secondary | ICD-10-CM | POA: Insufficient documentation

## 2023-08-04 DIAGNOSIS — N186 End stage renal disease: Secondary | ICD-10-CM | POA: Insufficient documentation

## 2023-08-04 DIAGNOSIS — I509 Heart failure, unspecified: Secondary | ICD-10-CM | POA: Insufficient documentation

## 2023-08-04 DIAGNOSIS — I120 Hypertensive chronic kidney disease with stage 5 chronic kidney disease or end stage renal disease: Secondary | ICD-10-CM | POA: Diagnosis not present

## 2023-08-04 LAB — MAGNESIUM: Magnesium: 3.2 mg/dL — ABNORMAL HIGH (ref 1.7–2.4)

## 2023-08-04 LAB — COMPREHENSIVE METABOLIC PANEL WITH GFR
ALT: 12 U/L (ref 0–44)
AST: 20 U/L (ref 15–41)
Albumin: 3.8 g/dL (ref 3.5–5.0)
Alkaline Phosphatase: 89 U/L (ref 38–126)
Anion gap: 19 — ABNORMAL HIGH (ref 5–15)
BUN: 84 mg/dL — ABNORMAL HIGH (ref 6–20)
CO2: 20 mmol/L — ABNORMAL LOW (ref 22–32)
Calcium: 8.2 mg/dL — ABNORMAL LOW (ref 8.9–10.3)
Chloride: 99 mmol/L (ref 98–111)
Creatinine, Ser: 14.1 mg/dL — ABNORMAL HIGH (ref 0.61–1.24)
GFR, Estimated: 4 mL/min — ABNORMAL LOW (ref >60)
Glucose, Bld: 90 mg/dL (ref 70–99)
Potassium: 6.3 mmol/L (ref 3.5–5.1)
Sodium: 138 mmol/L (ref 135–145)
Total Bilirubin: 0.7 mg/dL (ref 0.0–1.2)
Total Protein: 8.1 g/dL (ref 6.5–8.1)

## 2023-08-04 LAB — CBC WITH DIFFERENTIAL/PLATELET
Abs Immature Granulocytes: 0.14 10*3/uL — ABNORMAL HIGH (ref 0.00–0.07)
Basophils Absolute: 0.1 10*3/uL (ref 0.0–0.1)
Basophils Relative: 1 %
Eosinophils Absolute: 0.2 10*3/uL (ref 0.0–0.5)
Eosinophils Relative: 2 %
HCT: 26.6 % — ABNORMAL LOW (ref 39.0–52.0)
Hemoglobin: 9 g/dL — ABNORMAL LOW (ref 13.0–17.0)
Immature Granulocytes: 2 %
Lymphocytes Relative: 15 %
Lymphs Abs: 1.4 10*3/uL (ref 0.7–4.0)
MCH: 34.4 pg — ABNORMAL HIGH (ref 26.0–34.0)
MCHC: 33.8 g/dL (ref 30.0–36.0)
MCV: 101.5 fL — ABNORMAL HIGH (ref 80.0–100.0)
Monocytes Absolute: 0.9 10*3/uL (ref 0.1–1.0)
Monocytes Relative: 9 %
Neutro Abs: 6.7 10*3/uL (ref 1.7–7.7)
Neutrophils Relative %: 71 %
Platelets: 314 10*3/uL (ref 150–400)
RBC: 2.62 MIL/uL — ABNORMAL LOW (ref 4.22–5.81)
RDW: 13.9 % (ref 11.5–15.5)
WBC: 9.4 10*3/uL (ref 4.0–10.5)
nRBC: 0 % (ref 0.0–0.2)

## 2023-08-04 LAB — PHOSPHORUS: Phosphorus: 10.2 mg/dL — ABNORMAL HIGH (ref 2.5–4.6)

## 2023-08-04 MED ORDER — HEPARIN SODIUM (PORCINE) 1000 UNIT/ML DIALYSIS: 1000.0000 [IU] | INTRAMUSCULAR | Status: DC | PRN

## 2023-08-04 MED ORDER — PENTAFLUOROPROP-TETRAFLUOROETH EX AERO: 1.0000 | INHALATION_SPRAY | CUTANEOUS | Status: DC | PRN

## 2023-08-04 MED ORDER — LIDOCAINE HCL (PF) 1 % IJ SOLN: 5.0000 mL | INTRAMUSCULAR | Status: DC | PRN

## 2023-08-04 MED ORDER — ANTICOAGULANT SODIUM CITRATE 4% (200MG/5ML) IV SOLN: 5.0000 mL | Status: DC | PRN

## 2023-08-04 MED ORDER — LIDOCAINE-PRILOCAINE 2.5-2.5 % EX CREA: 1.0000 | TOPICAL_CREAM | CUTANEOUS | Status: DC | PRN

## 2023-08-04 MED ORDER — CALCIUM GLUCONATE-NACL 1-0.675 GM/50ML-% IV SOLN
1.0000 g | Freq: Once | INTRAVENOUS | Status: AC
  Administered 2023-08-04: 1000 mg via INTRAVENOUS
  Filled 2023-08-04: qty 50

## 2023-08-04 MED ORDER — CHLORHEXIDINE GLUCONATE CLOTH 2 % EX PADS: 6.0000 | MEDICATED_PAD | Freq: Every day | CUTANEOUS | Status: DC

## 2023-08-04 MED ORDER — ALTEPLASE 2 MG IJ SOLR: 2.0000 mg | Freq: Once | INTRAMUSCULAR | Status: DC | PRN

## 2023-08-04 NOTE — Procedures (Signed)
 I was present at the procedure, reviewed the HD regimen and made appropriate changes.   Vinson Moselle MD  [facility] 08/04/2023, 2:15 PM

## 2023-08-04 NOTE — ED Provider Notes (Signed)
 Patient came back from dialysis.  Was bedded in a hall bed and then eloped from the emergency room before I could evaluate him.  He was seen walking weight by nursing.  IV was removed when he was noticed.  Patient in no acute distress reportedly.    Glyn Ade, MD 08/04/23 [phone]

## 2023-08-04 NOTE — ED Notes (Signed)
 Pt to dialysis via stretcher by transport. Pt A&Ox4, NAD noted.

## 2023-08-04 NOTE — Progress Notes (Signed)
   08/04/23 1806  Vitals  Temp 98 F (36.7 C)  Pulse Rate 85  Resp 14  BP (!) 188/102  SpO2 100 %  O2 Device Room Air  Weight 75.2 kg  Type of Weight Post-Dialysis  Oxygen Therapy  Patient Activity (if Appropriate) In bed  Pulse Oximetry Type Continuous  Oximetry Probe Site Changed No  Post Treatment  Dialyzer Clearance Clear  Hemodialysis Intake (mL) 0 mL  Liters Processed 73.6  Fluid Removed (mL) 2000 mL  Tolerated HD Treatment Yes  AVG/AVF Arterial Site Held (minutes) 15 minutes  AVG/AVF Venous Site Held (minutes) 15 minutes   Received patient in bed to unit.  Alert and oriented.  Informed consent signed and in chart.   TX duration:3.5  Patient tolerated well.  Transported back to the room  Alert, without acute distress.  Hand-off given to patient's nurse.   Access used:RUAF Access issues: increased venous pressures from the onset of the treatment---nephrology has been aware  Total UF removed: 2000 Medication(s) given: none   [facility]

## 2023-08-04 NOTE — ED Triage Notes (Signed)
 Pt reports that he is here for dialysis. Pt reports he had a bad day on Friday but was able to get dialysis.
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
## 2023-08-04 NOTE — ED Notes (Signed)
 Pt requesting to leave denies current complaints at this time

## 2023-08-08 ENCOUNTER — Emergency Department (HOSPITAL_COMMUNITY)
Admission: EM | Admit: 2023-08-08 | Discharge: 2023-08-08 | Disposition: A | Discharge status: Home / Self Care | Attending: Emergency Medicine | Admitting: Emergency Medicine

## 2023-08-08 ENCOUNTER — Encounter (HOSPITAL_COMMUNITY): Payer: Self-pay | Admitting: Emergency Medicine

## 2023-08-08 ENCOUNTER — Other Ambulatory Visit: Payer: Self-pay

## 2023-08-08 DIAGNOSIS — I509 Heart failure, unspecified: Secondary | ICD-10-CM | POA: Diagnosis not present

## 2023-08-08 DIAGNOSIS — Z992 Dependence on renal dialysis: Secondary | ICD-10-CM | POA: Diagnosis not present

## 2023-08-08 DIAGNOSIS — N186 End stage renal disease: Secondary | ICD-10-CM | POA: Insufficient documentation

## 2023-08-08 DIAGNOSIS — E875 Hyperkalemia: Secondary | ICD-10-CM | POA: Insufficient documentation

## 2023-08-08 DIAGNOSIS — I120 Hypertensive chronic kidney disease with stage 5 chronic kidney disease or end stage renal disease: Secondary | ICD-10-CM | POA: Diagnosis not present

## 2023-08-08 LAB — BASIC METABOLIC PANEL WITH GFR
Anion gap: 17 — ABNORMAL HIGH (ref 5–15)
BUN: 91 mg/dL — ABNORMAL HIGH (ref 6–20)
CO2: 20 mmol/L — ABNORMAL LOW (ref 22–32)
Calcium: 7.9 mg/dL — ABNORMAL LOW (ref 8.9–10.3)
Chloride: 99 mmol/L (ref 98–111)
Creatinine, Ser: 15.23 mg/dL — ABNORMAL HIGH (ref 0.61–1.24)
GFR, Estimated: 3 mL/min — ABNORMAL LOW (ref >60)
Glucose, Bld: 112 mg/dL — ABNORMAL HIGH (ref 70–99)
Potassium: 5.9 mmol/L — ABNORMAL HIGH (ref 3.5–5.1)
Sodium: 136 mmol/L (ref 135–145)

## 2023-08-08 LAB — CBC
HCT: 23.1 % — ABNORMAL LOW (ref 39.0–52.0)
Hemoglobin: 8 g/dL — ABNORMAL LOW (ref 13.0–17.0)
MCH: 34.3 pg — ABNORMAL HIGH (ref 26.0–34.0)
MCHC: 34.6 g/dL (ref 30.0–36.0)
MCV: 99.1 fL (ref 80.0–100.0)
Platelets: 302 10*3/uL (ref 150–400)
RBC: 2.33 MIL/uL — ABNORMAL LOW (ref 4.22–5.81)
RDW: 13.4 % (ref 11.5–15.5)
WBC: 8.5 10*3/uL (ref 4.0–10.5)
nRBC: 0 % (ref 0.0–0.2)

## 2023-08-08 MED ORDER — HEPARIN SODIUM (PORCINE) 1000 UNIT/ML DIALYSIS
2500.0000 [IU] | Freq: Once | INTRAMUSCULAR | Status: AC
  Administered 2023-08-08: 2500 [IU] via INTRAVENOUS_CENTRAL
  Filled 2023-08-08 (×2): qty 3

## 2023-08-08 MED ORDER — PENTAFLUOROPROP-TETRAFLUOROETH EX AERO: 1.0000 | INHALATION_SPRAY | CUTANEOUS | Status: DC | PRN

## 2023-08-08 MED ORDER — CHLORHEXIDINE GLUCONATE CLOTH 2 % EX PADS: 6.0000 | MEDICATED_PAD | Freq: Every day | CUTANEOUS | Status: DC

## 2023-08-08 NOTE — ED Notes (Signed)
 Consent obtained, placed on bed with pt. Transport here to take pt to dialysis at this time.

## 2023-08-08 NOTE — Procedures (Signed)
 Asked to see this patient for hospital dialysis. Pt was discharged from his outpatient unit about recently for behavior issues. The plan will be for "ED HD". Pt is not to be admitted at this time. Pt will go to the dialysis unit when they are ready for the patient. When dialysis is completed pt will be sent back to ED for reassessment.     Last OP HD unit info (released 07/2023 from [facility]) 4h  B400  76.5kg  2K bath    RUA AVF  Heparin 2400  Labs showed K+ 5.9, will use 1K bath for 45 min, then 2K bath. Hb 8.0.     I was present at the procedure, reviewed the HD regimen and made appropriate changes.   Vinson Moselle MD  [facility] 08/08/2023, 12:00 PM

## 2023-08-08 NOTE — Procedures (Signed)
 HD Note:  Some information was entered later than the data was gathered due to patient care needs. The stated time with the data is accurate.  Received patient in bed to unit.   Alert and oriented.   Informed consent signed and in chart.   Access used: Upper right arm fistula Access issues: None  Patient tolerated treatment well.   TX duration: 3.5  Alert, without acute distress.  Total UF removed: 3000 ml  Hand-off given to patient's nurse.   Transported back to the room   Lauralei Clouse L. Dareen Piano, RN [facility].

## 2023-08-08 NOTE — ED Provider Notes (Signed)
 [place] EMERGENCY DEPARTMENT AT [facility] Provider Note   CSN: [id number] Arrival date & time: 08/08/23  4098     History  No chief complaint on file.   Paul [name] is a 58 y.o. male with PMHx ESRD on dialysis (MWF), CHF who presents to ED for dialysis. Last dialysis session was 5 days ago. Patient did not come to dialysis session 2 days ago because he was busy. Patient denies any symptoms today.  HPI     Home Medications Prior to Admission medications   Medication Sig Start Date End Date Taking? Authorizing Provider  carvedilol (COREG) 6.25 MG tablet Take 1 tablet (6.25 mg total) by mouth 2 (two) times daily with a meal. 07/29/23 08/28/23  Tawkaliyar, Roya, DO  isosorbide mononitrate (IMDUR) 30 MG 24 hr tablet Take 1 tablet (30 mg total) by mouth daily. 07/29/23   Tawkaliyar, Roya, DO  ketoconazole (NIZORAL) 2 % cream Apply 1 Application topically daily. 07/08/23   Edwin Cap, DPM  lidocaine-prilocaine (EMLA) cream Apply 1 a small amount to skin three times a week apply to AVF 30-60 min prior to dialysis, wrap site w plastic wrap Patient not taking: Reported on 07/08/2023 09/12/22     rosuvastatin (CRESTOR) 5 MG tablet Take 1 tablet (5 mg total) by mouth daily. 03/24/23     sucroferric oxyhydroxide (VELPHORO) 500 MG chewable tablet Chew 3 tablets (1,500 mg total) by mouth 3 (three) times daily with meals. 07/29/23   Tawkaliyar, Roya, DO      Allergies    Penicillin g    Review of Systems   Review of Systems  Gastrointestinal:        Dialysis    Physical Exam Updated Vital Signs BP (!) 168/88 (BP Location: Left Arm)   Pulse 100   Temp 97.9 F (36.6 C)   Resp 18   Ht 5\' 3"  (1.6 m)   Wt 75.2 kg   SpO2 99%   BMI 29.37 kg/m  Physical Exam Vitals and nursing note reviewed.  Constitutional:      General: He is not in acute distress.    Appearance: He is not ill-appearing or toxic-appearing.  HENT:     Head: Normocephalic and atraumatic.      Mouth/Throat:     Mouth: Mucous membranes are moist.  Eyes:     General: No scleral icterus.       Right eye: No discharge.        Left eye: No discharge.     Conjunctiva/sclera: Conjunctivae normal.  Cardiovascular:     Rate and Rhythm: Normal rate and regular rhythm.     Pulses: Normal pulses.     Heart sounds: Normal heart sounds. No murmur heard. Pulmonary:     Effort: Pulmonary effort is normal. No respiratory distress.     Breath sounds: Normal breath sounds. No wheezing, rhonchi or rales.  Abdominal:     General: Abdomen is flat. Bowel sounds are normal.     Palpations: Abdomen is soft.  Musculoskeletal:     Right lower leg: No edema.     Left lower leg: No edema.  Skin:    General: Skin is warm and dry.     Findings: No rash.  Neurological:     General: No focal deficit present.     Mental Status: He is alert and oriented to person, place, and time. Mental status is at baseline.  Psychiatric:        Mood and Affect:  Mood normal.     ED Results / Procedures / Treatments   Labs (all labs ordered are listed, but only abnormal results are displayed) Labs Reviewed  BASIC METABOLIC PANEL WITH GFR - Abnormal; Notable for the following components:      Result Value   Potassium 5.9 (*)    CO2 20 (*)    Glucose, Bld 112 (*)    BUN 91 (*)    Creatinine, Ser 15.23 (*)    Calcium 7.9 (*)    GFR, Estimated 3 (*)    Anion gap 17 (*)    All other components within normal limits  CBC - Abnormal; Notable for the following components:   RBC 2.33 (*)    Hemoglobin 8.0 (*)    HCT 23.1 (*)    MCH 34.3 (*)    All other components within normal limits    EKG None  Radiology No results found.  Procedures Procedures    Medications Ordered in ED Medications  Chlorhexidine Gluconate Cloth 2 % PADS 6 each (has no administration in time range)  pentafluoroprop-tetrafluoroeth (GEBAUERS) aerosol 1 Application (has no administration in time range)  heparin injection 2,500  Units (has no administration in time range)    ED Course/ Medical Decision Making/ A&P                                 Medical Decision Making Amount and/or Complexity of Data Reviewed Labs: ordered.   This patient presents to the ED for concern of dialysis, this involves an extensive number of treatment options, and is a complaint that carries with it a high risk of complications and morbidity.  The differential diagnosis includes electrolyte abnormality, acidosis, cardiac arrythmia   Co morbidities that complicate the patient evaluation  ESRD on dialysis (MWF), CHF   Additional history obtained:  Last dialysis session 3/31   Problem List / ED Course / Critical interventions / Medication management  Patient presents to ED for dialysis. Patient skipped his last dialysis session because he was busy. Last dialysis 3/31. Patient denies any symptoms today. Will arrange for dialysis from the ED and obtain laboratory studies here. I Ordered, and personally interpreted labs.  BMP with hyperkalemia at 5.9.  BUN/Cr elevated near patient's baseline at 91/15.23.  CBC without leukocytosis.  There is anemia with hemoglobin at 8.0. Consulted with Dr. Arlean Hopping who will see patient for dialysis. No need for potassium correction currently since patient is expected to go to dialysis before lunch. Attempted to order EKG for hyperkalemia - patient was taken to dialysis before tech was able to get EKG. I have reviewed the patients home medicines and have made adjustments as needed If dialysis is without complication, I expect patient should be appropriate for discharge after the session.   Social Determinants of Health:  none         Final Clinical Impression(s) / ED Diagnoses Final diagnoses:  ESRD on dialysis [facility])  Hyperkalemia    Rx / DC Orders ED Discharge Orders     None         Dorthy Cooler, [place] 08/08/23 8119    Benjiman Core, MD 08/08/23 1511

## 2023-08-08 NOTE — ED Triage Notes (Signed)
 Pt reports he is here for dialysis.  Last dialysis was Monday, "I skipped dialysis on Wednesday b/c it is such a big time killer and I have a lot to do."  No other complaints at this time.

## 2023-08-11 ENCOUNTER — Emergency Department (HOSPITAL_COMMUNITY)
Admission: EM | Admit: 2023-08-11 | Discharge: 2023-08-11 | Disposition: A | Discharge status: Home / Self Care | Attending: Emergency Medicine | Admitting: Emergency Medicine

## 2023-08-11 ENCOUNTER — Inpatient Hospital Stay: Admitting: Internal Medicine

## 2023-08-11 ENCOUNTER — Other Ambulatory Visit: Payer: Self-pay

## 2023-08-11 ENCOUNTER — Encounter (HOSPITAL_COMMUNITY): Payer: Self-pay | Admitting: Emergency Medicine

## 2023-08-11 DIAGNOSIS — Z992 Dependence on renal dialysis: Secondary | ICD-10-CM | POA: Insufficient documentation

## 2023-08-11 DIAGNOSIS — I120 Hypertensive chronic kidney disease with stage 5 chronic kidney disease or end stage renal disease: Secondary | ICD-10-CM | POA: Diagnosis not present

## 2023-08-11 DIAGNOSIS — N186 End stage renal disease: Secondary | ICD-10-CM | POA: Diagnosis not present

## 2023-08-11 LAB — RENAL FUNCTION PANEL
Albumin: 3.4 g/dL — ABNORMAL LOW (ref 3.5–5.0)
Anion gap: 22 — ABNORMAL HIGH (ref 5–15)
BUN: 84 mg/dL — ABNORMAL HIGH (ref 6–20)
CO2: 18 mmol/L — ABNORMAL LOW (ref 22–32)
Calcium: 8 mg/dL — ABNORMAL LOW (ref 8.9–10.3)
Chloride: 99 mmol/L (ref 98–111)
Creatinine, Ser: 15.49 mg/dL — ABNORMAL HIGH (ref 0.61–1.24)
GFR, Estimated: 3 mL/min — ABNORMAL LOW (ref >60)
Glucose, Bld: 121 mg/dL — ABNORMAL HIGH (ref 70–99)
Phosphorus: 11.3 mg/dL — ABNORMAL HIGH (ref 2.5–4.6)
Potassium: 4.8 mmol/L (ref 3.5–5.1)
Sodium: 139 mmol/L (ref 135–145)

## 2023-08-11 LAB — CBC
HCT: 24.3 % — ABNORMAL LOW (ref 39.0–52.0)
Hemoglobin: 8.1 g/dL — ABNORMAL LOW (ref 13.0–17.0)
MCH: 34.5 pg — ABNORMAL HIGH (ref 26.0–34.0)
MCHC: 33.3 g/dL (ref 30.0–36.0)
MCV: 103.4 fL — ABNORMAL HIGH (ref 80.0–100.0)
Platelets: 263 10*3/uL (ref 150–400)
RBC: 2.35 MIL/uL — ABNORMAL LOW (ref 4.22–5.81)
RDW: 13.1 % (ref 11.5–15.5)
WBC: 8.2 10*3/uL (ref 4.0–10.5)
nRBC: 0 % (ref 0.0–0.2)

## 2023-08-11 MED ORDER — HEPARIN SODIUM (PORCINE) 1000 UNIT/ML DIALYSIS: 2400.0000 [IU] | Freq: Once | INTRAMUSCULAR | Status: DC

## 2023-08-11 MED ORDER — ALTEPLASE 2 MG IJ SOLR: 2.0000 mg | Freq: Once | INTRAMUSCULAR | Status: DC | PRN

## 2023-08-11 MED ORDER — PENTAFLUOROPROP-TETRAFLUOROETH EX AERO: 1.0000 | INHALATION_SPRAY | CUTANEOUS | Status: DC | PRN

## 2023-08-11 MED ORDER — LIDOCAINE HCL (PF) 1 % IJ SOLN: 5.0000 mL | INTRAMUSCULAR | Status: DC | PRN

## 2023-08-11 MED ORDER — HEPARIN SODIUM (PORCINE) 1000 UNIT/ML DIALYSIS: 1000.0000 [IU] | INTRAMUSCULAR | Status: DC | PRN

## 2023-08-11 MED ORDER — ANTICOAGULANT SODIUM CITRATE 4% (200MG/5ML) IV SOLN: 5.0000 mL | Status: DC | PRN

## 2023-08-11 MED ORDER — LIDOCAINE-PRILOCAINE 2.5-2.5 % EX CREA: 1.0000 | TOPICAL_CREAM | CUTANEOUS | Status: DC | PRN

## 2023-08-11 NOTE — ED Triage Notes (Signed)
 Pt states he is here my dialysis, last tx was Friday here.

## 2023-08-11 NOTE — ED Notes (Signed)
 RN placed an food tray order for pt.

## 2023-08-11 NOTE — ED Notes (Signed)
 Pt no longer at bedside. Bystanders stated pt took belongings and left.

## 2023-08-11 NOTE — ED Notes (Signed)
 Pt stated that he is going to leave, I informed him its probably not the best idea and have his nurse call. Pt asked where the bathroom was and eloped.

## 2023-08-11 NOTE — ED Provider Notes (Signed)
 [place] EMERGENCY DEPARTMENT AT [facility] Provider Note   CSN: [id number] Arrival date & time: 08/11/23  8295     History  Chief Complaint  Patient presents with   Needs dialysis    Paul [name] is a 58 y.o. male.  The history is provided by the patient and medical records. No language interpreter was used.  Illness Location:  Here for dialysis Quality:  MWF Severity:  Unable to specify Onset quality:  Unable to specify Timing:  Unable to specify Progression:  Unable to specify Chronicity:  Chronic Associated symptoms: no abdominal pain, no chest pain, no congestion, no cough, no diarrhea, no fatigue, no fever, no headaches, no loss of consciousness, no nausea, no rash, no shortness of breath, no vomiting and no wheezing        Home Medications Prior to Admission medications   Medication Sig Start Date End Date Taking? Authorizing Provider  carvedilol (COREG) 6.25 MG tablet Take 1 tablet (6.25 mg total) by mouth 2 (two) times daily with a meal. 07/29/23 08/28/23  Tawkaliyar, Roya, DO  isosorbide mononitrate (IMDUR) 30 MG 24 hr tablet Take 1 tablet (30 mg total) by mouth daily. 07/29/23   Tawkaliyar, Roya, DO  ketoconazole (NIZORAL) 2 % cream Apply 1 Application topically daily. 07/08/23   Edwin Cap, DPM  lidocaine-prilocaine (EMLA) cream Apply 1 a small amount to skin three times a week apply to AVF 30-60 min prior to dialysis, wrap site w plastic wrap Patient not taking: Reported on 07/08/2023 09/12/22     rosuvastatin (CRESTOR) 5 MG tablet Take 1 tablet (5 mg total) by mouth daily. 03/24/23     sucroferric oxyhydroxide (VELPHORO) 500 MG chewable tablet Chew 3 tablets (1,500 mg total) by mouth 3 (three) times daily with meals. 07/29/23   Tawkaliyar, Roya, DO      Allergies    Penicillin g    Review of Systems   Review of Systems  Constitutional:  Negative for appetite change, chills, fatigue and fever.  HENT:  Negative for congestion.   Respiratory:   Negative for cough, chest tightness, shortness of breath and wheezing.   Cardiovascular:  Negative for chest pain.  Gastrointestinal:  Negative for abdominal pain, diarrhea, nausea and vomiting.  Genitourinary:  Negative for dysuria and flank pain.  Musculoskeletal:  Negative for back pain and neck pain.  Skin:  Negative for rash and wound.  Neurological:  Negative for loss of consciousness, light-headedness and headaches.  Psychiatric/Behavioral:  Negative for agitation and confusion.   All other systems reviewed and are negative.   Physical Exam Updated Vital Signs BP (!) 171/93   Pulse 86   Temp 98.3 F (36.8 C)   Resp 16   SpO2 97%  Physical Exam Vitals and nursing note reviewed.  Constitutional:      General: He is not in acute distress.    Appearance: Normal appearance. He is well-developed. He is not ill-appearing, toxic-appearing or diaphoretic.  HENT:     Head: Normocephalic and atraumatic.     Nose: No congestion or rhinorrhea.     Mouth/Throat:     Mouth: Mucous membranes are moist.     Pharynx: No oropharyngeal exudate or posterior oropharyngeal erythema.  Cardiovascular:     Rate and Rhythm: Normal rate and regular rhythm.     Pulses: Normal pulses.     Heart sounds: No murmur heard. Pulmonary:     Effort: Pulmonary effort is normal. No respiratory distress.  Breath sounds: Normal breath sounds. No stridor. No wheezing, rhonchi or rales.  Chest:     Chest wall: No tenderness.  Abdominal:     General: Abdomen is flat. There is no distension.     Palpations: Abdomen is soft.     Tenderness: There is no abdominal tenderness. There is no guarding or rebound.  Musculoskeletal:        General: No swelling, tenderness or signs of injury.     Cervical back: Neck supple. No tenderness.  Skin:    General: Skin is warm and dry.     Capillary Refill: Capillary refill takes less than 2 seconds.     Findings: No erythema.  Neurological:     Mental Status: He is  alert.  Psychiatric:        Mood and Affect: Mood normal.     ED Results / Procedures / Treatments   Labs (all labs ordered are listed, but only abnormal results are displayed) Labs Reviewed  RENAL FUNCTION PANEL  CBC    EKG None  Radiology No results found.  Procedures Procedures    Medications Ordered in ED Medications - No data to display  ED Course/ Medical Decision Making/ A&P                                 Medical Decision Making Amount and/or Complexity of Data Reviewed Labs: ordered.    Paul [name] is a 58 y.o. male with a past medical history significant for ESRD on dialysis MWF at this emergency department, CHF, bipolar disorder, schizophrenia, hyperlipidemia, and arthritis who presents for dialysis.  Patient reports that he has otherwise done well and had dialysis on Friday.  He denies any new fevers, chills, congestion, cough, nausea, vomiting, constipation, diarrhea, or urinary changes.  He did urinate this morning.  He thinks he is doing fairly well on fluid balance.  He is hungry and asking for a food tray and presents to get his dialysis.  On exam, lungs clear.  Chest nontender.  Abdomen nontender.  Patient moving extremities.  Patient resting comfortably.  Will order the renal function panel and CBC and place a consult to nephrology for dialysis.  Will let him eat.  Anticipate discharge from emergency department after dialysis is completed.   7:51 AM Spoke to nephrology who will help see and dialyze patient today.  Anticipate discharge after successful dialysis.        Final Clinical Impression(s) / ED Diagnoses Final diagnoses:  ESRD (end stage renal disease) (HCC)    Clinical Impression: 1. ESRD (end stage renal disease) (HCC)     Disposition: Anticipate going to dialysis today followed by reassessment Emergency Department and ultimate discharge if successful dialysis and no other complaints reported.  This note was prepared  with assistance of Conservation officer, historic buildings. Occasional wrong-word or sound-a-like substitutions may have occurred due to the inherent limitations of voice recognition software.     Valerie Cones, Canary Brim, MD 08/11/23 [phone]

## 2023-08-11 NOTE — Progress Notes (Signed)
 Complex Care Management Note  Care Guide Note 08/11/2023 Name: GRISELDA TOSH MRN: [id number] DOB: [birth year]  Elnoria Howard Kosch is a 58 y.o. year old male who sees Marcine Matar, MD for primary care. I reached out to Leticia Clas by phone today to offer complex care management services.  Mr. Face was given information about Complex Care Management services today including:   The Complex Care Management services include support from the care team which includes your Nurse Care Manager, Clinical Social Worker, or Pharmacist.  The Complex Care Management team is here to help remove barriers to the health concerns and goals most important to you. Complex Care Management services are voluntary, and the patient may decline or stop services at any time by request to their care team member.   Complex Care Management Consent Status: Patient agreed to services and verbal consent obtained.   Follow up plan:  Telephone appointment with complex care management team member scheduled for:  4/22  Encounter Outcome:  Patient Scheduled  Gwenevere Ghazi  [facility]  [facility], [facility] Guide  Direct Dial: [phone]  Fax [phone]

## 2023-08-11 NOTE — ED Notes (Signed)
 Pt eating at bedside.

## 2023-08-15 ENCOUNTER — Other Ambulatory Visit: Payer: Self-pay

## 2023-08-15 ENCOUNTER — Emergency Department (HOSPITAL_COMMUNITY): Admission: EM | Admit: 2023-08-15 | Discharge: 2023-08-15 | Disposition: A | Discharge status: Home / Self Care

## 2023-08-15 DIAGNOSIS — N186 End stage renal disease: Secondary | ICD-10-CM | POA: Insufficient documentation

## 2023-08-15 DIAGNOSIS — Z992 Dependence on renal dialysis: Secondary | ICD-10-CM | POA: Insufficient documentation

## 2023-08-15 DIAGNOSIS — I120 Hypertensive chronic kidney disease with stage 5 chronic kidney disease or end stage renal disease: Secondary | ICD-10-CM | POA: Diagnosis not present

## 2023-08-15 LAB — CBC
HCT: 20.6 % — ABNORMAL LOW (ref 39.0–52.0)
Hemoglobin: 7.1 g/dL — ABNORMAL LOW (ref 13.0–17.0)
MCH: 34.1 pg — ABNORMAL HIGH (ref 26.0–34.0)
MCHC: 34.5 g/dL (ref 30.0–36.0)
MCV: 99 fL (ref 80.0–100.0)
Platelets: 235 10*3/uL (ref 150–400)
RBC: 2.08 MIL/uL — ABNORMAL LOW (ref 4.22–5.81)
RDW: 13.4 % (ref 11.5–15.5)
WBC: 6.3 10*3/uL (ref 4.0–10.5)
nRBC: 0 % (ref 0.0–0.2)

## 2023-08-15 LAB — GLUCOSE, CAPILLARY: Glucose-Capillary: 94 mg/dL (ref 70–99)

## 2023-08-15 LAB — RENAL FUNCTION PANEL
Albumin: 3.2 g/dL — ABNORMAL LOW (ref 3.5–5.0)
Anion gap: 23 — ABNORMAL HIGH (ref 5–15)
BUN: 129 mg/dL — ABNORMAL HIGH (ref 6–20)
CO2: 14 mmol/L — ABNORMAL LOW (ref 22–32)
Calcium: 6.8 mg/dL — ABNORMAL LOW (ref 8.9–10.3)
Chloride: 101 mmol/L (ref 98–111)
Creatinine, Ser: 20.87 mg/dL — ABNORMAL HIGH (ref 0.61–1.24)
GFR, Estimated: 2 mL/min — ABNORMAL LOW (ref >60)
Glucose, Bld: 104 mg/dL — ABNORMAL HIGH (ref 70–99)
Phosphorus: >30 mg/dL — ABNORMAL HIGH (ref 2.5–4.6)
Potassium: 6.1 mmol/L — ABNORMAL HIGH (ref 3.5–5.1)
Sodium: 138 mmol/L (ref 135–145)

## 2023-08-15 LAB — POTASSIUM: Potassium: 3.6 mmol/L (ref 3.5–5.1)

## 2023-08-15 MED ORDER — CHLORHEXIDINE GLUCONATE CLOTH 2 % EX PADS: 6.0000 | MEDICATED_PAD | Freq: Every day | CUTANEOUS | Status: DC

## 2023-08-15 NOTE — ED Triage Notes (Signed)
Patient is here for his routine hemodialysis treatment .

## 2023-08-15 NOTE — Procedures (Signed)
 I was present at this dialysis session. I have reviewed the session and made appropriate changes.   Previous events noted, discussed with nursing and Rogers Blocker PA.  At the time of my evaluation patient is awake, alert, interactive and without complaint.  Previous issue etiology unclear, may be hypotension.  Disequilibrium is a possibility but I think unlikely as BUN is only 129.  Blood flow rate has been reduced.  Strongly stated to the patient that he should make every effort to receive 3 treatments a week.  If remains stable plan is to return to the ED for discharge.  Filed Weights   08/15/23 1215  Weight: 80 kg    Recent Labs  Lab 08/15/23 1253  NA 138  K 6.1*  CL 101  CO2 14*  GLUCOSE 104*  BUN 129*  CREATININE 20.87*  CALCIUM 6.8*  PHOS >30.0*    Recent Labs  Lab 08/11/23 0732 08/15/23 1253  WBC 8.2 6.3  HGB 8.1* 7.1*  HCT 24.3* 20.6*  MCV 103.4* 99.0  PLT 263 235    Scheduled Meds:  Chlorhexidine Gluconate Cloth  6 each Topical Q0600   Continuous Infusions: PRN Meds:.   Sabra Heck  MD 08/15/2023, 4:47 PM

## 2023-08-15 NOTE — Progress Notes (Signed)
 Upon my arrival patient is lying in bed.  He is alert and oriented.  He describes that he had some nausea and vomiting but feels better now only mild nausea.  He denies HA.  No focal deficit noted. He keeps his right eye closed which per staff is his normal.

## 2023-08-15 NOTE — ED Provider Notes (Signed)
 [place] EMERGENCY DEPARTMENT AT [facility] Provider Note   CSN: [id number] Arrival date & time: 08/15/23  8295     History  Chief Complaint  Patient presents with   Needs Hemodialysis    Paul [name] is a 58 y.o. male.  This is a 58 year old male presenting emergency department for dialysis.  He has no specific complaints today.  Last dialysis 4/7; Monday Wednesday Friday schedule.  Asking for a blanket and food.        Home Medications Prior to Admission medications   Medication Sig Start Date End Date Taking? Authorizing Provider  carvedilol (COREG) 6.25 MG tablet Take 1 tablet (6.25 mg total) by mouth 2 (two) times daily with a meal. 07/29/23 08/28/23  Tawkaliyar, Roya, DO  isosorbide mononitrate (IMDUR) 30 MG 24 hr tablet Take 1 tablet (30 mg total) by mouth daily. 07/29/23   Tawkaliyar, Roya, DO  ketoconazole (NIZORAL) 2 % cream Apply 1 Application topically daily. 07/08/23   Edwin Cap, DPM  lidocaine-prilocaine (EMLA) cream Apply 1 a small amount to skin three times a week apply to AVF 30-60 min prior to dialysis, wrap site w plastic wrap Patient not taking: Reported on 07/08/2023 09/12/22     rosuvastatin (CRESTOR) 5 MG tablet Take 1 tablet (5 mg total) by mouth daily. 03/24/23     sucroferric oxyhydroxide (VELPHORO) 500 MG chewable tablet Chew 3 tablets (1,500 mg total) by mouth 3 (three) times daily with meals. 07/29/23   Tawkaliyar, Roya, DO      Allergies    Penicillin g    Review of Systems   Review of Systems  Physical Exam Updated Vital Signs BP (!) 134/93   Pulse 88   Temp (!) 97.2 F (36.2 C)   Resp 15   Wt 80 kg   SpO2 100%   BMI 31.24 kg/m  Physical Exam Vitals and nursing note reviewed.  Constitutional:      General: He is not in acute distress.    Appearance: He is not toxic-appearing.  HENT:     Nose: Nose normal.  Eyes:     Conjunctiva/sclera: Conjunctivae normal.  Cardiovascular:     Rate and Rhythm: Normal rate and  regular rhythm.     Comments: Fistula with thrill. Abdominal:     General: Abdomen is flat. There is no distension.  Skin:    General: Skin is warm.     Capillary Refill: Capillary refill takes less than 2 seconds.  Neurological:     Mental Status: He is alert.  Psychiatric:        Behavior: Behavior normal.     ED Results / Procedures / Treatments   Labs (all labs ordered are listed, but only abnormal results are displayed) Labs Reviewed  RENAL FUNCTION PANEL - Abnormal; Notable for the following components:      Result Value   Potassium 6.1 (*)    CO2 14 (*)    Glucose, Bld 104 (*)    BUN 129 (*)    Creatinine, Ser 20.87 (*)    Calcium 6.8 (*)    Phosphorus >30.0 (*)    Albumin 3.2 (*)    GFR, Estimated 2 (*)    Anion gap 23 (*)    All other components within normal limits  CBC - Abnormal; Notable for the following components:   RBC 2.08 (*)    Hemoglobin 7.1 (*)    HCT 20.6 (*)    MCH 34.1 (*)  All other components within normal limits  GLUCOSE, CAPILLARY    EKG None  Radiology No results found.  Procedures Procedures    Medications Ordered in ED Medications  Chlorhexidine Gluconate Cloth 2 % PADS 6 each (has no administration in time range)    ED Course/ Medical Decision Making/ A&P Clinical Course as of 08/15/23 1511  Fri Aug 15, 2023  0808 Spoke with nephro; will dialyze today.  [TY]    Clinical Course User Index [TY] Coral Spikes, DO                                 Medical Decision Making This is a 58 year old male presenting emergency department for dialysis.  Afebrile vital signs reassuring, slightly hypertensive.  Not having any symptoms today.  Per chart review appears we are his routine dialysis center and was last dialyzed on 08/11/2023.  Nephrology consulted.  Plan for dialysis and dispo.  Care signed out to afternoon team. Dispo pending re-eval after dialysis.           Final Clinical Impression(s) / ED Diagnoses Final  diagnoses:  None    Rx / DC Orders ED Discharge Orders     None         Coral Spikes, DO 08/15/23 1511

## 2023-08-15 NOTE — ED Provider Notes (Signed)
 Patient returned from dialysis and left prior to my evaluation.   Scarlette Currier, MD 08/16/23 1113

## 2023-08-15 NOTE — ED Notes (Signed)
 After pt returned from dialysis & left before paperwork & VS were done.

## 2023-08-15 NOTE — Progress Notes (Signed)
 Treatment has been restarted for the patient after being assessed by rapid response and  Rogers Blocker PA. Patient experienced nausea and cramping after becoming unresponsive with twitching type body activity. The patient experienced this episode after receiving an hour of treatment.The patient's blood pressure has not dropped and greater than 120 systolic. Potassium level has been addressed by Rogers Blocker PA. The patient will continue his treatment with modified orders. Vital signs are stable prior to the restart of his treatment.

## 2023-08-15 NOTE — Progress Notes (Signed)
 Patient arrived to the hemodialysis unit. Patient is alert and oriented x 4. Patient has no verbalized concerns. Patient states that he has not had dialysis in a week. Patient expressed frustration this week regarding waiting in the emergency room and he left earlier in the week without receiving a treatment. Writer informed the patient that since he does not have a dialysis clinic, this is the process for receiving treatments. Patient received education regarding the importance of receiving dialysis as ordered.

## 2023-08-15 NOTE — Progress Notes (Signed)
 [facility]  Patient here for HD treatment, does not have outpatient dialysis unit due to threatening behavior. Seen on HD. RN reports he had two brief episodes of jerking and unresponsiveness which resolved without intervention. Vital signs were stable without hypotension. Pt reports he felt dizzy, nauseous and diaphoretic before each episode which sounds consistent with pre-syncope. It is possible a hypotensive BP was not captured. He is currently alert and oriented x3. VSS, mildly hypertensive. At present, pt is asymptomatic, no HA, shortness of breath, CP, dizziness, nausea. He is calm but does state "all women are evil," which is a long held belief of his. Labs showed BUN 129 Cr 20.87 K+ 6.1. Will turn down BFR to 300 to prevent dysequilibrium. Reduced UF goal to 2L. Since patient is currently stable with hyperkalemia, will continue treatment with close monitoring. If he has another episode of altered responsiveness will terminate HD treatment. Pt is agreeable with this plan.   Rogers Blocker, PA-C 08/15/2023, 2:24 PM  [facility] Pager: [phone]

## 2023-08-18 ENCOUNTER — Encounter (HOSPITAL_COMMUNITY): Payer: Self-pay | Admitting: Emergency Medicine

## 2023-08-18 ENCOUNTER — Other Ambulatory Visit: Payer: Self-pay

## 2023-08-18 ENCOUNTER — Emergency Department (HOSPITAL_COMMUNITY)
Admission: EM | Admit: 2023-08-18 | Discharge: 2023-08-18 | Discharge status: Against Medical Advice | Attending: Emergency Medicine | Admitting: Emergency Medicine

## 2023-08-18 DIAGNOSIS — I120 Hypertensive chronic kidney disease with stage 5 chronic kidney disease or end stage renal disease: Secondary | ICD-10-CM | POA: Diagnosis not present

## 2023-08-18 DIAGNOSIS — N186 End stage renal disease: Secondary | ICD-10-CM | POA: Diagnosis not present

## 2023-08-18 DIAGNOSIS — Z992 Dependence on renal dialysis: Secondary | ICD-10-CM | POA: Diagnosis not present

## 2023-08-18 DIAGNOSIS — I509 Heart failure, unspecified: Secondary | ICD-10-CM | POA: Diagnosis not present

## 2023-08-18 DIAGNOSIS — I132 Hypertensive heart and chronic kidney disease with heart failure and with stage 5 chronic kidney disease, or end stage renal disease: Secondary | ICD-10-CM | POA: Diagnosis not present

## 2023-08-18 DIAGNOSIS — Z79899 Other long term (current) drug therapy: Secondary | ICD-10-CM | POA: Insufficient documentation

## 2023-08-18 LAB — BASIC METABOLIC PANEL WITH GFR
Anion gap: 16 — ABNORMAL HIGH (ref 5–15)
BUN: 31 mg/dL — ABNORMAL HIGH (ref 6–20)
CO2: 25 mmol/L (ref 22–32)
Calcium: 8.7 mg/dL — ABNORMAL LOW (ref 8.9–10.3)
Chloride: 95 mmol/L — ABNORMAL LOW (ref 98–111)
Creatinine, Ser: 6.26 mg/dL — ABNORMAL HIGH (ref 0.61–1.24)
GFR, Estimated: 10 mL/min — ABNORMAL LOW (ref >60)
Glucose, Bld: 81 mg/dL (ref 70–99)
Potassium: 3.6 mmol/L (ref 3.5–5.1)
Sodium: 136 mmol/L (ref 135–145)

## 2023-08-18 LAB — IRON AND TIBC
Iron: 196 ug/dL — ABNORMAL HIGH (ref 45–182)
Saturation Ratios: 69 % — ABNORMAL HIGH (ref 17.9–39.5)
TIBC: 286 ug/dL (ref 250–450)
UIBC: 90 ug/dL

## 2023-08-18 LAB — CBC
HCT: 23.7 % — ABNORMAL LOW (ref 39.0–52.0)
Hemoglobin: 8 g/dL — ABNORMAL LOW (ref 13.0–17.0)
MCH: 33.5 pg (ref 26.0–34.0)
MCHC: 33.8 g/dL (ref 30.0–36.0)
MCV: 99.2 fL (ref 80.0–100.0)
Platelets: 220 10*3/uL (ref 150–400)
RBC: 2.39 MIL/uL — ABNORMAL LOW (ref 4.22–5.81)
RDW: 13 % (ref 11.5–15.5)
WBC: 5.6 10*3/uL (ref 4.0–10.5)
nRBC: 0 % (ref 0.0–0.2)

## 2023-08-18 MED ORDER — DARBEPOETIN ALFA 40 MCG/0.4ML IJ SOSY
40.0000 ug | PREFILLED_SYRINGE | Freq: Once | INTRAMUSCULAR | Status: AC
  Administered 2023-08-18: 40 ug via SUBCUTANEOUS
  Filled 2023-08-18: qty 0.4

## 2023-08-18 MED ORDER — CHLORHEXIDINE GLUCONATE CLOTH 2 % EX PADS: 6.0000 | MEDICATED_PAD | Freq: Every day | CUTANEOUS | Status: DC

## 2023-08-18 NOTE — ED Notes (Signed)
 Pt consent form and hat placed on back of bed.

## 2023-08-18 NOTE — ED Triage Notes (Signed)
 Patient is here for routine dialysis, last had it on Friday.

## 2023-08-18 NOTE — Progress Notes (Signed)
 Labs drawn at [phone]. Lab results were not found. Lab has been called and they have not received the lab tubes. Unable to collect  renal lab tubes with correct results at this time, due to the initiation of treatment.

## 2023-08-18 NOTE — ED Provider Notes (Addendum)
 [place] EMERGENCY DEPARTMENT AT [facility] Provider Note   CSN: [id number] Arrival date & time: 08/18/23  8295     History  Chief Complaint  Patient presents with   Needs Dialysis   HPI Paul [name] is a 58 y.o. male with ESRD presenting for dialysis.  States he has been coming here for the past couple of months for dialysis.  States his schedule is normally Monday Wednesday Friday.  Last dialysis was April 11.  Denies chest pain or shortness of breath.  States he is feeling somewhat lightheaded but otherwise well.   Past Medical History:  Diagnosis Date   Bipolar disorder (HCC)    Central retinal artery occlusion    with macular infarction of the right eye. status post posterior vitrectomy and photocoagulation with insertion of a shunt in 2006.   CHF (congestive heart failure) (HCC)    chronic mixed syst/diast. 02/2009 echo: Systolic function was mildly reduced. The estimated ejection fraction was in 45%, global HK, Grade I Diast dysfxn.   Chronic kidney disease    ESRD   Heart failure    Obesity    Osteoarthritis    s/p right hip replacement in 2001   Schizophrenia (HCC)    Severe uncontrolled hypertension      HPI     Home Medications Prior to Admission medications   Medication Sig Start Date End Date Taking? Authorizing Provider  carvedilol  (COREG ) 6.25 MG tablet Take 1 tablet (6.25 mg total) by mouth 2 (two) times daily with a meal. 07/29/23 08/28/23  Tawkaliyar, Roya, DO  isosorbide  mononitrate (IMDUR ) 30 MG 24 hr tablet Take 1 tablet (30 mg total) by mouth daily. 07/29/23   Tawkaliyar, Roya, DO  ketoconazole  (NIZORAL ) 2 % cream Apply 1 Application topically daily. 07/08/23   Floyce Hutching, DPM  lidocaine -prilocaine  (EMLA ) cream Apply 1 a small amount to skin three times a week apply to AVF 30-60 min prior to dialysis, wrap site w plastic wrap Patient not taking: Reported on 07/08/2023 09/12/22     rosuvastatin  (CRESTOR ) 5 MG tablet Take 1 tablet (5  mg total) by mouth daily. 03/24/23     sucroferric oxyhydroxide (VELPHORO ) 500 MG chewable tablet Chew 3 tablets (1,500 mg total) by mouth 3 (three) times daily with meals. 07/29/23   Tawkaliyar, Roya, DO      Allergies    Penicillin g    Review of Systems   See HPI  Physical Exam Updated Vital Signs BP (!) 163/97 (BP Location: Left Arm)   Pulse 72   Temp 97.8 F (36.6 C) (Oral)   Resp 16   Wt 78 kg   SpO2 100%   BMI 30.46 kg/m  Physical Exam Vitals and nursing note reviewed.  HENT:     Head: Normocephalic and atraumatic.     Mouth/Throat:     Mouth: Mucous membranes are moist.  Eyes:     General:        Right eye: No discharge.        Left eye: No discharge.     Conjunctiva/sclera: Conjunctivae normal.  Cardiovascular:     Rate and Rhythm: Normal rate and regular rhythm.     Pulses: Normal pulses.     Heart sounds: Normal heart sounds.  Pulmonary:     Effort: Pulmonary effort is normal.     Breath sounds: Normal breath sounds.  Abdominal:     General: Abdomen is flat.     Palpations: Abdomen is soft.  Skin:    General: Skin is warm and dry.  Neurological:     General: No focal deficit present.     Comments: GCS 15. Speech is goal oriented. No deficits appreciated to CN III-XII; symmetric eyebrow raise, no facial drooping, tongue midline. Patient has equal grip strength bilaterally with 5/5 strength against resistance in all major muscle groups bilaterally. Sensation to light touch intact. Patient moves extremities without ataxia. Normal finger-nose-finger. Patient ambulatory with steady gait.  Psychiatric:        Mood and Affect: Mood normal.     ED Results / Procedures / Treatments   Labs (all labs ordered are listed, but only abnormal results are displayed) Labs Reviewed  CBC  BASIC METABOLIC PANEL WITH GFR    EKG None  Radiology No results found.  Procedures Procedures    Medications Ordered in ED Medications  Chlorhexidine  Gluconate Cloth 2  % PADS 6 each (has no administration in time range)    ED Course/ Medical Decision Making/ A&P                                 Medical Decision Making Amount and/or Complexity of Data Reviewed Labs: ordered.   58 year old well-appearing male presenting for need for dialysis.  Exam was unremarkable.  Patient did endorse some lightheadedness and "wooziness" but suspicion for stroke is low given reassuring neuroexam without vertiginous symptoms.  Otherwise looks well, nontoxic hemodynamically stable in no acute distress. Patient is well-known to the nephrology department here. Labs pending. Reached out to Dr. Zana Hesselbach of nephrology who agreed to list him for hemodialysis.  Patient has been transferred to HD unit.  Will return after HD.  Likely discharge from the ED if well-appearing and hemodynamically stable.  Patient returned to the ED after hemodialysis.  States he is feeling much better, no longer lightheaded, no chest pain or shortness of breath.  Advised him to follow-up with his PCP discussed return precautions.  Discharge.      Final Clinical Impression(s) / ED Diagnoses Final diagnoses:  Dialysis patient [facility])    Rx / DC Orders ED Discharge Orders     None         Janalee Mcmurray, PA-C 08/18/23 0946    Janalee Mcmurray, PA-C 08/18/23 1451    Teddi Favors, DO 08/22/23 [phone]

## 2023-08-18 NOTE — Procedures (Signed)
 Asked to see this patient for hospital dialysis. Pt was discharged from his outpatient recently for behavior issues. The plan will be for "ED HD". Pt is not to be admitted at this time. Pt will go to the dialysis unit when they are ready for the patient. When dialysis is completed pt will be sent back to ED for reassessment.     Last OP HD unit info --> just released 07/2023 from [facility] 4h  B400  76.5kg  2K bath    RUA AVF  Heparin 2400    Clinical issues: 1) Anemia of esrd:  - Pt dose not have an outpatient nephrologist or HD unit so does not have access to ESA and other needed medications.  - recent Hb in mid April are in the 7- 9 range - pt rec'd darbe sq 40 mcg on 3/10, and 500mg  venofer IV also on 3/10 - tsat 31% and ferritin 1175 on 07/21/23   Plan -  - needs darbe 40 mcg q 1-2 wks for now - will give darbe 40 mcg w/ HD today - Fe labs okay 1 month ago, will retest - goal Hb 9- 11 range     I was present at the procedure, reviewed the HD regimen and made appropriate changes.   Larry Poag MD  [facility] 08/18/2023, 12:03 PM   Last Labs       Recent Labs  Lab 08/15/23 1253 08/15/23 1600 08/18/23 0815  HGB 7.1*  --  8.0*  ALBUMIN 3.2*  --   --   CALCIUM 6.8*  --   --   PHOS >30.0*  --   --   CREATININE 20.87*  --   --   K 6.1* 3.6  --

## 2023-08-18 NOTE — Discharge Instructions (Signed)
 Evaluate today was overall reassuring.  Please follow-up with your PCP.  If you develop chest pain, shortness of breath, accumulate fluid in your lower extremities or around your eyes or any other concerning symptom please return to the ED for further evaluation.

## 2023-08-18 NOTE — Progress Notes (Deleted)
 Asked to see this patient for hospital dialysis. Pt was discharged from his outpatient recently for behavior issues. The plan will be for "ED HD". Pt is not to be admitted at this time. Pt will go to the dialysis unit when they are ready for the patient. When dialysis is completed pt will be sent back to ED for reassessment.     Last OP HD unit info --> just released 07/2023 from [facility]  B400  76.5kg  2K bath    RUA AVF  Heparin 2400   Clinical issues: 1) Anemia of esrd:  - Pt dose not have an outpatient nephrologist or HD unit so does not have access to ESA and other needed medications.  - recent Hb in mid April are in the 7- 9 range - pt rec'd darbe sq 40 mcg on 3/10, and 500mg  venofer IV also on 3/10 - tsat 31% and ferritin 1175 on 07/21/23  Plan -  - needs darbe 40 mcg q 1-2 wks for now - will give darbe 40 mcg w/ HD today - Fe labs okay 1 month ago, will retest - goal Hb 9- 11 range    Larry Poag  MD  [facility] 08/18/2023, 11:45 AM  Recent Labs  Lab 08/15/23 1253 08/15/23 1600 08/18/23 0815  HGB 7.1*  --  8.0*  ALBUMIN 3.2*  --   --   CALCIUM 6.8*  --   --   PHOS >30.0*  --   --   CREATININE 20.87*  --   --   K 6.1* 3.6  --     Inpatient medications:  Chlorhexidine Gluconate Cloth  6 each Topical Q0600

## 2023-08-18 NOTE — ED Notes (Signed)
 Pt left without getting VS or D/C paperwork

## 2023-08-22 ENCOUNTER — Other Ambulatory Visit: Payer: Self-pay

## 2023-08-22 ENCOUNTER — Emergency Department (HOSPITAL_COMMUNITY)
Admission: EM | Admit: 2023-08-22 | Discharge: 2023-08-22 | Disposition: A | Discharge status: Home / Self Care | Attending: Emergency Medicine | Admitting: Emergency Medicine

## 2023-08-22 DIAGNOSIS — Z5321 Procedure and treatment not carried out due to patient leaving prior to being seen by health care provider: Secondary | ICD-10-CM | POA: Insufficient documentation

## 2023-08-22 DIAGNOSIS — N186 End stage renal disease: Secondary | ICD-10-CM | POA: Diagnosis not present

## 2023-08-22 DIAGNOSIS — Z992 Dependence on renal dialysis: Secondary | ICD-10-CM | POA: Insufficient documentation

## 2023-08-22 DIAGNOSIS — I120 Hypertensive chronic kidney disease with stage 5 chronic kidney disease or end stage renal disease: Secondary | ICD-10-CM | POA: Diagnosis not present

## 2023-08-22 LAB — CBC
HCT: 23.6 % — ABNORMAL LOW (ref 39.0–52.0)
Hemoglobin: 7.9 g/dL — ABNORMAL LOW (ref 13.0–17.0)
MCH: 34.2 pg — ABNORMAL HIGH (ref 26.0–34.0)
MCHC: 33.5 g/dL (ref 30.0–36.0)
MCV: 102.2 fL — ABNORMAL HIGH (ref 80.0–100.0)
Platelets: 245 10*3/uL (ref 150–400)
RBC: 2.31 MIL/uL — ABNORMAL LOW (ref 4.22–5.81)
RDW: 13 % (ref 11.5–15.5)
WBC: 6.4 10*3/uL (ref 4.0–10.5)
nRBC: 0 % (ref 0.0–0.2)

## 2023-08-22 LAB — BASIC METABOLIC PANEL WITH GFR
Anion gap: 17 — ABNORMAL HIGH (ref 5–15)
BUN: 81 mg/dL — ABNORMAL HIGH (ref 6–20)
CO2: 24 mmol/L (ref 22–32)
Calcium: 7.4 mg/dL — ABNORMAL LOW (ref 8.9–10.3)
Chloride: 97 mmol/L — ABNORMAL LOW (ref 98–111)
Creatinine, Ser: 15.47 mg/dL — ABNORMAL HIGH (ref 0.61–1.24)
GFR, Estimated: 3 mL/min — ABNORMAL LOW (ref >60)
Glucose, Bld: 95 mg/dL (ref 70–99)
Potassium: 5.1 mmol/L (ref 3.5–5.1)
Sodium: 138 mmol/L (ref 135–145)

## 2023-08-22 NOTE — ED Triage Notes (Signed)
 Pt presents to the ED for his scheduled dialysis. Is currently receiving dialysis MWF here at [facility]. Reports last dialysis Monday. He sts he "sometimes skips Wednesdays" No CP/SOB/fluid retention.

## 2023-08-22 NOTE — ED Provider Notes (Signed)
 Dr. Zana Hesselbach, Nephrology, is aware of patient's presence in the ED.   Burnette Carte, MD 08/22/23 [phone]

## 2023-08-22 NOTE — ED Notes (Signed)
 Unable to find pt vital sign check.

## 2023-08-22 NOTE — ED Notes (Signed)
 No answer for vitals

## 2023-08-22 NOTE — ED Notes (Signed)
 Called x 2 in lobby and looked outside no answer.

## 2023-08-22 NOTE — ED Provider Triage Note (Signed)
 Emergency Medicine Provider Triage Evaluation Note  Paul [name] , a 58 y.o. male  was evaluated in triage.  Pt complains of needing dialysis.  He is here for routine dialysis, normally gets dialysis on Monday, Wednesday, Friday but did miss Wednesday of this week.  He has no new complaints.  Review of Systems  Positive:  Negative:   Physical Exam  There were no vitals taken for this visit. Gen:   Awake, no distress   Resp:  Normal effort  MSK:   Moves extremities without difficulty  Other:    Medical Decision Making  Medically screening exam initiated at 6:23 AM.  Appropriate orders placed.  Paul [name] was informed that the remainder of the evaluation will be completed by another provider, this initial triage assessment does not replace that evaluation, and the importance of remaining in the ED until their evaluation is complete.     Elisa Guest, [place] 08/22/23 [phone]

## 2023-08-22 NOTE — Progress Notes (Signed)
 Attempted to put pt in for transport to come for HD session, noted he was discharged. Spoke with charge nurse Loetta Ringer , pt has left without informing the ED.

## 2023-08-25 ENCOUNTER — Other Ambulatory Visit: Payer: Self-pay

## 2023-08-25 ENCOUNTER — Emergency Department (HOSPITAL_COMMUNITY)
Admission: EM | Admit: 2023-08-25 | Discharge: 2023-08-25 | Disposition: A | Discharge status: Home / Self Care | Attending: Emergency Medicine | Admitting: Emergency Medicine

## 2023-08-25 ENCOUNTER — Encounter (HOSPITAL_COMMUNITY): Payer: Self-pay

## 2023-08-25 DIAGNOSIS — N186 End stage renal disease: Secondary | ICD-10-CM | POA: Insufficient documentation

## 2023-08-25 DIAGNOSIS — I509 Heart failure, unspecified: Secondary | ICD-10-CM | POA: Insufficient documentation

## 2023-08-25 DIAGNOSIS — F17200 Nicotine dependence, unspecified, uncomplicated: Secondary | ICD-10-CM | POA: Insufficient documentation

## 2023-08-25 DIAGNOSIS — Z992 Dependence on renal dialysis: Secondary | ICD-10-CM | POA: Insufficient documentation

## 2023-08-25 DIAGNOSIS — I132 Hypertensive heart and chronic kidney disease with heart failure and with stage 5 chronic kidney disease, or end stage renal disease: Secondary | ICD-10-CM | POA: Insufficient documentation

## 2023-08-25 DIAGNOSIS — I120 Hypertensive chronic kidney disease with stage 5 chronic kidney disease or end stage renal disease: Secondary | ICD-10-CM | POA: Diagnosis not present

## 2023-08-25 LAB — BASIC METABOLIC PANEL WITH GFR
Anion gap: 20 — ABNORMAL HIGH (ref 5–15)
BUN: 119 mg/dL — ABNORMAL HIGH (ref 6–20)
CO2: 19 mmol/L — ABNORMAL LOW (ref 22–32)
Calcium: 7.4 mg/dL — ABNORMAL LOW (ref 8.9–10.3)
Chloride: 97 mmol/L — ABNORMAL LOW (ref 98–111)
Creatinine, Ser: 20.58 mg/dL — ABNORMAL HIGH (ref 0.61–1.24)
GFR, Estimated: 2 mL/min — ABNORMAL LOW (ref >60)
Glucose, Bld: 99 mg/dL (ref 70–99)
Potassium: 5.6 mmol/L — ABNORMAL HIGH (ref 3.5–5.1)
Sodium: 136 mmol/L (ref 135–145)

## 2023-08-25 LAB — CBC WITH DIFFERENTIAL/PLATELET
Abs Immature Granulocytes: 0.05 10*3/uL (ref 0.00–0.07)
Basophils Absolute: 0 10*3/uL (ref 0.0–0.1)
Basophils Relative: 1 %
Eosinophils Absolute: 0.2 10*3/uL (ref 0.0–0.5)
Eosinophils Relative: 3 %
HCT: 23.7 % — ABNORMAL LOW (ref 39.0–52.0)
Hemoglobin: 7.9 g/dL — ABNORMAL LOW (ref 13.0–17.0)
Immature Granulocytes: 1 %
Lymphocytes Relative: 18 %
Lymphs Abs: 1.2 10*3/uL (ref 0.7–4.0)
MCH: 34.3 pg — ABNORMAL HIGH (ref 26.0–34.0)
MCHC: 33.3 g/dL (ref 30.0–36.0)
MCV: 103 fL — ABNORMAL HIGH (ref 80.0–100.0)
Monocytes Absolute: 0.7 10*3/uL (ref 0.1–1.0)
Monocytes Relative: 10 %
Neutro Abs: 4.8 10*3/uL (ref 1.7–7.7)
Neutrophils Relative %: 67 %
Platelets: 249 10*3/uL (ref 150–400)
RBC: 2.3 MIL/uL — ABNORMAL LOW (ref 4.22–5.81)
RDW: 13.4 % (ref 11.5–15.5)
WBC: 6.9 10*3/uL (ref 4.0–10.5)
nRBC: 0 % (ref 0.0–0.2)

## 2023-08-25 MED ORDER — ALTEPLASE 2 MG IJ SOLR: 2.0000 mg | Freq: Once | INTRAMUSCULAR | Status: DC | PRN

## 2023-08-25 MED ORDER — ANTICOAGULANT SODIUM CITRATE 4% (200MG/5ML) IV SOLN: 5.0000 mL | Status: DC | PRN

## 2023-08-25 MED ORDER — HEPARIN SODIUM (PORCINE) 1000 UNIT/ML DIALYSIS: 2000.0000 [IU] | Freq: Once | INTRAMUSCULAR | Status: DC

## 2023-08-25 MED ORDER — HEPARIN SODIUM (PORCINE) 1000 UNIT/ML DIALYSIS: 1000.0000 [IU] | INTRAMUSCULAR | Status: DC | PRN

## 2023-08-25 MED ORDER — LIDOCAINE HCL (PF) 1 % IJ SOLN: 5.0000 mL | INTRAMUSCULAR | Status: DC | PRN

## 2023-08-25 MED ORDER — CHLORHEXIDINE GLUCONATE CLOTH 2 % EX PADS: 6.0000 | MEDICATED_PAD | Freq: Every day | CUTANEOUS | Status: DC

## 2023-08-25 MED ORDER — PENTAFLUOROPROP-TETRAFLUOROETH EX AERO: 1.0000 | INHALATION_SPRAY | CUTANEOUS | Status: DC | PRN

## 2023-08-25 MED ORDER — LIDOCAINE-PRILOCAINE 2.5-2.5 % EX CREA: 1.0000 | TOPICAL_CREAM | CUTANEOUS | Status: DC | PRN

## 2023-08-25 NOTE — ED Triage Notes (Signed)
 Patient comes for dialysis which is suppose to be MWF patient reports he has not had dialysis since last Monday because he came last Friday and the wait was too long so he left.

## 2023-08-25 NOTE — Progress Notes (Signed)
 Received patient in bed to unit.  Alert and oriented.  Informed consent signed and in chart.   TX duration: 3  Patient tolerated well.  Transported back to the room  Alert, without acute distress.  Hand-off given to patient's nurse.   Access used: Right AVF Access issues: None  Total UF removed: 2000 ml Medication(s) given: See [facility]   08/25/23 1747  Vitals  Temp 97.6 F (36.4 C)  Pulse Rate 73  Resp 15  BP (!) 153/94  SpO2 100 %  Oxygen Therapy  Patient Activity (if Appropriate) In bed  Pulse Oximetry Type Continuous  Post Treatment  Dialyzer Clearance Clear  Hemodialysis Intake (mL) 0 mL  Liters Processed 42.7  Fluid Removed (mL) 2000 mL  Tolerated HD Treatment Yes

## 2023-08-25 NOTE — ED Provider Notes (Signed)
 [place] EMERGENCY DEPARTMENT AT [facility] Provider Note   CSN: [id number] Arrival date & time: 08/25/23  4098     History  Chief Complaint  Patient presents with   Dialysis    Paul [name] is a 58 y.o. male with a history of CHF, uncontrolled hypertension, and ESRD on dialysis who presents the ED today for dialysis.  Patient is scheduled to receive dialysis in the hospital on Monday, Wednesday, and Fridays.  States that he missed his last couple schedule appointments, last time he got dialysis was a week ago.  Denies any shortness of breath, weakness, or fatigue.  He returns today to get his next round of dialysis.  No additional complaints or concerns at this time.    Home Medications Prior to Admission medications   Medication Sig Start Date End Date Taking? Authorizing Provider  carvedilol  (COREG ) 6.25 MG tablet Take 1 tablet (6.25 mg total) by mouth 2 (two) times daily with a meal. 07/29/23 08/28/23  Tawkaliyar, Roya, DO  isosorbide  mononitrate (IMDUR ) 30 MG 24 hr tablet Take 1 tablet (30 mg total) by mouth daily. 07/29/23   Tawkaliyar, Roya, DO  ketoconazole  (NIZORAL ) 2 % cream Apply 1 Application topically daily. 07/08/23   Floyce Hutching, DPM  lidocaine -prilocaine  (EMLA ) cream Apply 1 a small amount to skin three times a week apply to AVF 30-60 min prior to dialysis, wrap site w plastic wrap Patient not taking: Reported on 07/08/2023 09/12/22     rosuvastatin  (CRESTOR ) 5 MG tablet Take 1 tablet (5 mg total) by mouth daily. 03/24/23     sucroferric oxyhydroxide (VELPHORO ) 500 MG chewable tablet Chew 3 tablets (1,500 mg total) by mouth 3 (three) times daily with meals. 07/29/23   Tawkaliyar, Roya, DO      Allergies    Penicillin g    Review of Systems   Review of Systems  Constitutional:        Needing dialysis  All other systems reviewed and are negative.   Physical Exam Updated Vital Signs BP (!) 199/106   Pulse 69   Temp (!) 97.5 F (36.4 C)   Resp 13    Ht 5\' 3"  (1.6 m)   Wt 76.2 kg   SpO2 100%   BMI 29.76 kg/m  Physical Exam Vitals and nursing note reviewed.  Constitutional:      General: He is not in acute distress.    Appearance: Normal appearance.     Comments: Resting comfortably in bed  HENT:     Head: Normocephalic and atraumatic.     Mouth/Throat:     Mouth: Mucous membranes are moist.  Eyes:     Conjunctiva/sclera: Conjunctivae normal.     Pupils: Pupils are equal, round, and reactive to light.  Cardiovascular:     Rate and Rhythm: Normal rate and regular rhythm.     Pulses: Normal pulses.     Heart sounds: Normal heart sounds.  Pulmonary:     Effort: Pulmonary effort is normal.     Breath sounds: Normal breath sounds.  Abdominal:     Palpations: Abdomen is soft.     Tenderness: There is no abdominal tenderness.  Musculoskeletal:        General: Normal range of motion.  Skin:    General: Skin is warm and dry.     Findings: No rash.  Neurological:     General: No focal deficit present.     Mental Status: He is alert.  Sensory: No sensory deficit.     Motor: No weakness.  Psychiatric:        Mood and Affect: Mood normal.        Behavior: Behavior normal.    ED Results / Procedures / Treatments   Labs (all labs ordered are listed, but only abnormal results are displayed) Labs Reviewed  CBC WITH DIFFERENTIAL/PLATELET - Abnormal; Notable for the following components:      Result Value   RBC 2.30 (*)    Hemoglobin 7.9 (*)    HCT 23.7 (*)    MCV 103.0 (*)    MCH 34.3 (*)    All other components within normal limits  BASIC METABOLIC PANEL WITH GFR - Abnormal; Notable for the following components:   Potassium 5.6 (*)    Chloride 97 (*)    CO2 19 (*)    BUN 119 (*)    Creatinine, Ser 20.58 (*)    Calcium  7.4 (*)    GFR, Estimated 2 (*)    Anion gap 20 (*)    All other components within normal limits  HEPATITIS B SURFACE ANTIGEN  HEPATITIS B SURFACE ANTIBODY, QUANTITATIVE     EKG None  Radiology No results found.  Procedures Procedures: not indicated.   Medications Ordered in ED Medications  Chlorhexidine  Gluconate Cloth 2 % PADS 6 each (has no administration in time range)  pentafluoroprop-tetrafluoroeth (GEBAUERS) aerosol 1 Application (has no administration in time range)  lidocaine  (PF) (XYLOCAINE ) 1 % injection 5 mL (has no administration in time range)  lidocaine -prilocaine  (EMLA ) cream 1 Application (has no administration in time range)  heparin  injection 1,000 Units (has no administration in time range)  anticoagulant sodium citrate  solution 5 mL (has no administration in time range)  alteplase  (CATHFLO ACTIVASE ) injection 2 mg (has no administration in time range)  heparin  injection 2,000 Units (has no administration in time range)    ED Course/ Medical Decision Making/ A&P                                 Medical Decision Making Amount and/or Complexity of Data Reviewed Labs: ordered.   This patient presents to the ED for concern of hemodialysis, this involves an extensive number of treatment options, and is a complaint that carries with it a high risk of complications and morbidity.    Comorbidities  See HPI above   Additional History  Additional history obtained from prior records   Lab Tests  I ordered and personally interpreted labs.  The pertinent results include:   Elevated potassium of 5.6, otherwise CMP and CBC within normal limits for patient   Consultations  I requested consultation with nephrology,  and discussed lab and imaging findings as well as pertinent plan - they recommend: HD today.   Problem List / ED Course / Critical Interventions / Medication Management  Patient presents to the ED today after missing dialysis for the past week. No complaints at this time. I have reviewed the patients home medicines and have made adjustments as needed   Social Determinants of Health  Tobacco use   Test  / Admission - Considered  Patient is stable and safe for dialysis. Reassess after and discharge home if stable.       Final Clinical Impression(s) / ED Diagnoses Final diagnoses:  Encounter for hemodialysis [facility])    Rx / DC Orders ED Discharge Orders     None  Sonnie Dusky, PA-C 08/25/23 1455    Teddi Favors, DO 08/26/23 [phone]

## 2023-08-25 NOTE — Progress Notes (Signed)
 Called by ED - here for HD. Last dialyzed 1 week ago. Labs reviewed - K 5.6, BUN 119.   Orders placed - adjusted slightly (lower BFR) to prevent dialysis dysequilibrium.  Blood pressure (!) 185/98, pulse 74, temperature (!) 97.5 F (36.4 C), resp. rate 13, height 5\' 3"  (1.6 m), weight 76.2 kg, SpO2 93%.  Will return to ED after HD and be discharged from there.  Janus Mercury, PA-C [employer] Pager [phone]

## 2023-08-26 ENCOUNTER — Other Ambulatory Visit: Payer: Self-pay

## 2023-08-26 ENCOUNTER — Other Ambulatory Visit (HOSPITAL_COMMUNITY): Payer: Self-pay

## 2023-08-26 NOTE — Patient Instructions (Signed)
 Visit Information  Thank you for taking time to visit with me today. Please don't hesitate to contact me if I can be of assistance to you before our next scheduled appointment.  Our next appointment is by telephone on 09/09/23 at 2 PM Please call the care guide team at [phone] if you need to cancel or reschedule your appointment.   Following is a copy of your care plan:   Goals Addressed             This Visit's Progress    VBCI RN Care Plan   On track    Problems:  Chronic Disease Management support and education needs related to CHF, ESRD, and HLD No Advanced Directives in place Non-adherence to prescribed medication regimen  Goal: Over the next 30 days the Patient will demonstrate Improved adherence to prescribed treatment plan for ESRD as evidenced by attending all dialysis sessions demonstrate Improved health management independence as evidenced by checking his weight daily        take all medications exactly as prescribed and will call provider for medication related questions as evidenced by medication adherence    verbalize basic understanding of ESRD disease process and self health management plan as evidenced by attending all dialysis sessions verbalize understanding of plan for management of HLD as evidenced by medication adherence  Interventions:   Heart Failure Interventions: Assessed need for readable accurate scales in home Advised patient to weigh each morning after emptying bladder Discussed importance of daily weight and advised patient to weigh and record daily   Chronic Kidney Disease Interventions: Assessed the Patient understanding of chronic kidney disease    Reviewed medications with patient and discussed importance of compliance    Counseled on adverse effects of illicit drug and excessive alcohol use in patients with chronic kidney disease    Discussed complications of poorly controlled blood pressure such as heart disease, stroke, circulatory  complications, vision complications, kidney impairment, sexual dysfunction    Discussed plans with patient for ongoing care management follow up and provided patient with direct contact information for care management team    Educated patient on the risks of skipping dialysis sessions Last practice recorded BP readings:  BP Readings from Last 3 Encounters:  08/25/23 (!) 153/94  08/22/23 (!) 182/98  08/18/23 (!) 142/93   Most recent eGFR/CrCl:  Lab Results  Component Value Date   EGFR 17 (L) 06/14/2021    No components found for: "CRCL"    Hyperlipidemia Interventions: Medication review performed; medication list updated in electronic medical record.  Reviewed role and benefits of statin for ASCVD risk reduction  Patient Self-Care Activities:  Attend all scheduled provider appointments Call pharmacy for medication refills 3-7 days in advance of running out of medications Call provider office for new concerns or questions  Take medications as prescribed   call office if I gain more than 2 pounds in one day or 5 pounds in one week watch for swelling in feet, ankles and legs every day weigh myself daily Call the pharmacy and request a refill of Rosuvastatin  Attend all dialysis sessions  Plan:  Telephone follow up appointment with care management team member scheduled for:  5//6/25 at 2 PM             Please call the Suicide and Crisis Lifeline: 988 call 1-800-273-TALK (toll free, 24 hour hotline) if you are experiencing a Mental Health or Behavioral Health Crisis or need someone to talk to.  Patient verbalizes understanding of instructions and  care plan provided today and agrees to view in MyChart. Active MyChart status and patient understanding of how to access instructions and care plan via MyChart confirmed with patient.     Theodora Fish, RN MSN [place]  [facility] RN Care Manager Direct Dial : [phone]  Fax: [phone]

## 2023-08-26 NOTE — Patient Outreach (Signed)
 Complex Care Management   Visit Note  08/26/2023  Name:  Paul [name] MRN: [id number] DOB: 02/24/1966  Situation: Referral received for Complex Care Management related to Heart Failure and ESRD I obtained verbal consent from Patient.  Visit completed with Paul [name]  on the phone  Background:   Past Medical History:  Diagnosis Date   Bipolar disorder [facility])    Central retinal artery occlusion    with macular infarction of the right eye. status post posterior vitrectomy and photocoagulation with insertion of a shunt in 2006.   CHF (congestive heart failure) (HCC)    chronic mixed syst/diast. 02/2009 echo: Systolic function was mildly reduced. The estimated ejection fraction was in 45%, global HK, Grade I Diast dysfxn.   Chronic kidney disease    ESRD   Heart failure    Obesity    Osteoarthritis    s/p right hip replacement in 2001   Schizophrenia (HCC)    Severe uncontrolled hypertension     Assessment: Patient Reported Symptoms:  Cognitive Cognitive Status: Able to follow simple commands, Alert and oriented to person, place, and time, Normal speech and language skills Cognitive/Intellectual Conditions Management [RPT]: None reported or documented in medical history or problem list      Neurological Neurological Review of Symptoms: No symptoms reported    HEENT HEENT Symptoms Reported: No symptoms reported HEENT Conditions: Vision problem(s) Vision Problems: blindness/vision loss (legally blind R eye) Vision problem(s)  Cardiovascular Cardiovascular Symptoms Reported: No symptoms reported Does patient have uncontrolled Hypertension?: Yes (Per chart review) Is patient checking Blood Pressure at home?: No Cardiovascular Conditions: Heart failure, High blood cholesterol, Hypertension Cardiovascular Management Strategies: Medication therapy  Respiratory Respiratory Symptoms Reported: No symptoms reported    Endocrine Patient reports the following symptoms related to  hypoglycemia or hyperglycemia : No symptoms reported Is patient diabetic?: No    Gastrointestinal Gastrointestinal Symptoms Reported: Nausea (nausea comes and goes, relieved with eating something. Last BM today) Additional Gastrointestinal Details: Patient reports appetite has not been great, no weight loss noted Gastrointestinal Management Strategies: Diet modification Nutrition Risk Screen (CP): No indicators present  Genitourinary Genitourinary Symptoms Reported: No symptoms reported (Patient reports being able to make his own urine. Urine is clear and yellow) Genitourinary Conditions: End-stage renal disease Genitourinary Management Strategies: Hemodialysis, Fluid modification (Patient reports 1 L fluid restriction) Hemodialysis Schedule: M, W, F (via R AV fistula. Patient reports frequently skipping Wednesday session because he needs time for himself) Hemodialysis Last Treatment: 08/25/23  Integumentary Integumentary Symptoms Reported: Other (Scalp laceration per chart review. Staples were removed by PCP 07/31/23. Patient is unable to see the wound himself)    Musculoskeletal Musculoskelatal Symptoms Reviewed: Weakness (low energy) Musculoskeletal Conditions: Osteoarthritis Musculoskeletal Management Strategies: Adequate rest Falls in the past year?: No Number of falls in past year: 1 or less Was there an injury with Fall?: No Fall Risk Category Calculator: 0 Patient Fall Risk Level: Low Fall Risk Patient at Risk for Falls Due to: Impaired vision Fall risk Follow up: Falls evaluation completed  Psychosocial Psychosocial Symptoms Reported: No symptoms reported Behavioral Health Conditions: Other (Schizoaffective disorder, bipolar type; Schizophrenia)   Quality of Family Relationships: non-existent Do you feel physically threatened by others?: No      06/07/2021    2:06 PM  Depression screen PHQ 2/9  Decreased Interest 0  Down, Depressed, Hopeless 0  PHQ - 2 Score 0    There  were no vitals filed for this visit.  Medications Reviewed Today  Reviewed by Valaria Garland, RN (Registered Nurse) on 08/26/23 at 1532  Med List Status: <None>   Medication Order Taking? Sig Documenting Provider Last Dose Status Informant  carvedilol  (COREG ) 6.25 MG tablet [id number] Yes Take 1 tablet (6.25 mg total) by mouth 2 (two) times daily with a meal. Tawkaliyar, Roya, DO Taking Active   isosorbide  mononitrate (IMDUR ) 30 MG 24 hr tablet [id number] Yes Take 1 tablet (30 mg total) by mouth daily. Lanney Pitts, DO Taking Active   ketoconazole  (NIZORAL ) 2 % cream [id number] No Apply 1 Application topically daily.  Patient not taking: Reported on 08/26/2023   Floyce Hutching, DPM Not Taking Active Self, Pharmacy Records           Med Note [facility], SEBASTIAN   Fri Jul 25, 2023  8:22 PM)    lidocaine -prilocaine  (EMLA ) cream [id number] No Apply 1 a small amount to skin three times a week apply to AVF 30-60 min prior to dialysis, wrap site w plastic wrap  Patient not taking: Reported on 07/08/2023    Not Taking Active Self, Pharmacy Records  rosuvastatin  (CRESTOR ) 5 MG tablet [id number] Yes Take 1 tablet (5 mg total) by mouth daily.  Taking Active Self, Pharmacy Records           Med Note (Peyson Postema P   Tue Aug 26, 2023  3:32 PM) Patient reports he has been out of medication for a couple of weeks after leaving it in a hotel  sucroferric oxyhydroxide (VELPHORO ) 500 MG chewable tablet [id number] Yes Chew 3 tablets (1,500 mg total) by mouth 3 (three) times daily with meals. Lanney Pitts, DO Taking Active            Med Note (Roger Fasnacht P   Tue Aug 26, 2023  3:30 PM) Patient is taking 2 tablets 3 times a day            Recommendation:   Patient will call pharmacy to request refill of Rosuvastatin  Patient will attend all dialysis sessions Patient will beginning checking his weight daily  Follow Up Plan:   Telephone follow up appointment date/time:  09/09/23 at 2  PM  Theodora Fish, RN MSN [place]  [facility] Population Health RN Care Manager Direct Dial : [phone]  Fax: [phone]

## 2023-08-29 ENCOUNTER — Other Ambulatory Visit (HOSPITAL_COMMUNITY): Payer: Self-pay

## 2023-08-29 ENCOUNTER — Other Ambulatory Visit: Payer: Self-pay

## 2023-08-29 ENCOUNTER — Emergency Department (HOSPITAL_COMMUNITY)
Admission: EM | Admit: 2023-08-29 | Discharge: 2023-08-29 | Disposition: A | Discharge status: Home / Self Care | Attending: Emergency Medicine | Admitting: Emergency Medicine

## 2023-08-29 ENCOUNTER — Encounter (HOSPITAL_COMMUNITY): Payer: Self-pay | Admitting: Emergency Medicine

## 2023-08-29 DIAGNOSIS — Z992 Dependence on renal dialysis: Secondary | ICD-10-CM | POA: Insufficient documentation

## 2023-08-29 DIAGNOSIS — I120 Hypertensive chronic kidney disease with stage 5 chronic kidney disease or end stage renal disease: Secondary | ICD-10-CM | POA: Diagnosis not present

## 2023-08-29 DIAGNOSIS — N186 End stage renal disease: Secondary | ICD-10-CM | POA: Insufficient documentation

## 2023-08-29 LAB — COMPREHENSIVE METABOLIC PANEL WITH GFR
ALT: 10 U/L (ref 0–44)
AST: 9 U/L — ABNORMAL LOW (ref 15–41)
Albumin: 3.5 g/dL (ref 3.5–5.0)
Alkaline Phosphatase: 64 U/L (ref 38–126)
Anion gap: 21 — ABNORMAL HIGH (ref 5–15)
BUN: 121 mg/dL — ABNORMAL HIGH (ref 6–20)
CO2: 17 mmol/L — ABNORMAL LOW (ref 22–32)
Calcium: 6.7 mg/dL — ABNORMAL LOW (ref 8.9–10.3)
Chloride: 100 mmol/L (ref 98–111)
Creatinine, Ser: 19.26 mg/dL — ABNORMAL HIGH (ref 0.61–1.24)
GFR, Estimated: 2 mL/min — ABNORMAL LOW (ref >60)
Glucose, Bld: 129 mg/dL — ABNORMAL HIGH (ref 70–99)
Potassium: 5.1 mmol/L (ref 3.5–5.1)
Sodium: 138 mmol/L (ref 135–145)
Total Bilirubin: 0.7 mg/dL (ref 0.0–1.2)
Total Protein: 7.1 g/dL (ref 6.5–8.1)

## 2023-08-29 LAB — CBC
HCT: 21.4 % — ABNORMAL LOW (ref 39.0–52.0)
Hemoglobin: 7.2 g/dL — ABNORMAL LOW (ref 13.0–17.0)
MCH: 34.4 pg — ABNORMAL HIGH (ref 26.0–34.0)
MCHC: 33.6 g/dL (ref 30.0–36.0)
MCV: 102.4 fL — ABNORMAL HIGH (ref 80.0–100.0)
Platelets: 212 10*3/uL (ref 150–400)
RBC: 2.09 MIL/uL — ABNORMAL LOW (ref 4.22–5.81)
RDW: 13.9 % (ref 11.5–15.5)
WBC: 5.8 10*3/uL (ref 4.0–10.5)
nRBC: 0 % (ref 0.0–0.2)

## 2023-08-29 MED ORDER — PENTAFLUOROPROP-TETRAFLUOROETH EX AERO: 1.0000 | INHALATION_SPRAY | CUTANEOUS | Status: DC | PRN

## 2023-08-29 MED ORDER — HEPARIN SODIUM (PORCINE) 1000 UNIT/ML DIALYSIS: 1000.0000 [IU] | INTRAMUSCULAR | Status: DC | PRN

## 2023-08-29 MED ORDER — HEPARIN SODIUM (PORCINE) 1000 UNIT/ML IJ SOLN
INTRAMUSCULAR | Status: AC
  Filled 2023-08-29: qty 2

## 2023-08-29 MED ORDER — LIDOCAINE HCL (PF) 1 % IJ SOLN: 5.0000 mL | INTRAMUSCULAR | Status: DC | PRN

## 2023-08-29 MED ORDER — HEPARIN SODIUM (PORCINE) 1000 UNIT/ML DIALYSIS: 2000.0000 [IU] | Freq: Once | INTRAMUSCULAR | Status: DC

## 2023-08-29 MED ORDER — LIDOCAINE-PRILOCAINE 2.5-2.5 % EX CREA: 1.0000 | TOPICAL_CREAM | CUTANEOUS | Status: DC | PRN

## 2023-08-29 NOTE — Progress Notes (Signed)
 Brief nephrology note: ESRD on HD who comes to ER for dialysis.  Labs reviewed.  Plan for dialysis today.  Likely discharge after completion of HD.  Discussed with the ED provider.  Truman Gables, [facility].

## 2023-08-29 NOTE — ED Notes (Signed)
 The patient could not be located for dialysis. Per Hemodialysis, they have been searching for the patient for over 30 minutes and he has lost his spot in dialysis for today.  The nurse was made aware via secure chat as there was no answer when I called the phone.

## 2023-08-29 NOTE — ED Notes (Signed)
 Transport came for pt to take to HD. Pt not in bed. Waited 20 minutes pt did not return. Checked ED. Did not find pt

## 2023-08-29 NOTE — ED Provider Notes (Signed)
 [place] EMERGENCY DEPARTMENT AT [facility] Provider Note   CSN: [id number] Arrival date & time: 08/29/23  4098     History  Chief Complaint  Patient presents with   "needs dialysis"    Paul [name] is a 58 y.o. male history of ESRD on dialysis presented for dialysis.  Patient is no other concerns at this time and states he is here for his Monday Wednesday Friday dialysis.  Patient denies chest pain or shortness of breath or fevers.    Home Medications Prior to Admission medications   Medication Sig Start Date End Date Taking? Authorizing Provider  carvedilol  (COREG ) 6.25 MG tablet Take 1 tablet (6.25 mg total) by mouth 2 (two) times daily with a meal. 07/29/23 08/28/23  Tawkaliyar, Roya, DO  isosorbide  mononitrate (IMDUR ) 30 MG 24 hr tablet Take 1 tablet (30 mg total) by mouth daily. 07/29/23   Tawkaliyar, Roya, DO  ketoconazole  (NIZORAL ) 2 % cream Apply 1 Application topically daily. Patient not taking: Reported on 08/26/2023 07/08/23   Floyce Hutching, DPM  lidocaine -prilocaine  (EMLA ) cream Apply 1 a small amount to skin three times a week apply to AVF 30-60 min prior to dialysis, wrap site w plastic wrap Patient not taking: Reported on 07/08/2023 09/12/22     rosuvastatin  (CRESTOR ) 5 MG tablet Take 1 tablet (5 mg total) by mouth daily. 03/24/23     sucroferric oxyhydroxide (VELPHORO ) 500 MG chewable tablet Chew 3 tablets (1,500 mg total) by mouth 3 (three) times daily with meals. 07/29/23   Tawkaliyar, Roya, DO      Allergies    Penicillin g    Review of Systems   Review of Systems  Physical Exam Updated Vital Signs BP 128/88   Pulse 67   Temp 98.3 F (36.8 C)   Resp 14   SpO2 100%  Physical Exam Vitals reviewed.  Constitutional:      General: He is not in acute distress. HENT:     Head: Normocephalic and atraumatic.  Eyes:     Extraocular Movements: Extraocular movements intact.     Conjunctiva/sclera: Conjunctivae normal.     Pupils: Pupils are  equal, round, and reactive to light.  Cardiovascular:     Rate and Rhythm: Normal rate and regular rhythm.     Pulses: Normal pulses.     Heart sounds: Normal heart sounds.     Comments: 2+ bilateral radial/dorsalis pedis pulses with regular rate Pulmonary:     Effort: Pulmonary effort is normal. No respiratory distress.     Breath sounds: Normal breath sounds.  Abdominal:     Palpations: Abdomen is soft.     Tenderness: There is no abdominal tenderness. There is no guarding or rebound.  Musculoskeletal:        General: Normal range of motion.     Cervical back: Normal range of motion and neck supple.     Comments: 5 out of 5 bilateral grip/leg extension strength  Skin:    General: Skin is warm and dry.     Capillary Refill: Capillary refill takes less than 2 seconds.  Neurological:     General: No focal deficit present.     Mental Status: He is alert and oriented to person, place, and time.     Comments: Sensation intact in all 4 limbs  Psychiatric:        Mood and Affect: Mood normal.     ED Results / Procedures / Treatments   Labs (all labs ordered are  listed, but only abnormal results are displayed) Labs Reviewed  CBC - Abnormal; Notable for the following components:      Result Value   RBC 2.09 (*)    Hemoglobin 7.2 (*)    HCT 21.4 (*)    MCV 102.4 (*)    MCH 34.4 (*)    All other components within normal limits  COMPREHENSIVE METABOLIC PANEL WITH GFR - Abnormal; Notable for the following components:   CO2 17 (*)    Glucose, Bld 129 (*)    BUN 121 (*)    Creatinine, Ser 19.26 (*)    Calcium  6.7 (*)    AST 9 (*)    GFR, Estimated 2 (*)    Anion gap 21 (*)    All other components within normal limits  HEPATITIS B SURFACE ANTIGEN  HEPATITIS B SURFACE ANTIBODY, QUANTITATIVE    EKG None  Radiology No results found.  Procedures Procedures    Medications Ordered in ED Medications - No data to display  ED Course/ Medical Decision Making/ A&P                                  Medical Decision Making  Satya Heady Verdi 58 y.o. presented today for dialysis. Working DDx that I considered at this time includes, but not limited to, electrolyte abnormalities, arrhythmias, emergent dialysis, dehydration, fluid overload, pleural effusions, uremic encephalopathy.  R/o DDx: arrhythmias, emergent dialysis, dehydration, fluid overload, pleural effusions, uremic encephalopathy: These are considered less likely due to history of present illness, physical exam, labs/imaging findings.  Review of prior external notes: 08/26/2023 patient returned  Unique Tests and My Independent Interpretation:  CBC: At baseline BMP: At baseline  Social Determinants of Health: none  Discussion with Independent Historian: None  Discussion of Management of Tests:  Edson Graces, MD Nephrology  Risk: High: hospitalization or escalation of hospital-level care  Risk Stratification Score: none  Plan: On exam patient was no acute distress stable vitals.  Exam was unremarkable.  Patient is endorsing any red flag symptoms that necessitate further workup and states he is only here for dialysis and so we will consult nephrology.  Contacted nephrology and they agree with dialysis for the patient. Will await for Nephrology to transport patient for dialysis and anticipate discharge afterwards.  This chart was dictated using voice recognition software.  Despite best efforts to proofread,  errors can occur which can change the documentation meaning.        Final Clinical Impression(s) / ED Diagnoses Final diagnoses:  ESRD (end stage renal disease) [facility])    Rx / DC Orders ED Discharge Orders     None         Annah Jasko T, PA-C 08/29/23 1328    Nicklas Barns, MD 09/01/23 [phone]

## 2023-08-29 NOTE — ED Triage Notes (Signed)
 Pt states he is here for dialysis.  Last treatment was Monday, he skipped Wednesday b/c "it takes up so much of my day"  No complaints of SOB, pain or swelling.

## 2023-08-29 NOTE — ED Notes (Signed)
 Located pt, pt to triage for MSE.

## 2023-09-01 ENCOUNTER — Other Ambulatory Visit: Payer: Self-pay

## 2023-09-01 ENCOUNTER — Emergency Department (HOSPITAL_COMMUNITY)
Admission: EM | Admit: 2023-09-01 | Discharge: 2023-09-01 | Disposition: A | Discharge status: Home / Self Care | Attending: Emergency Medicine | Admitting: Emergency Medicine

## 2023-09-01 ENCOUNTER — Encounter (HOSPITAL_COMMUNITY): Payer: Self-pay

## 2023-09-01 DIAGNOSIS — N186 End stage renal disease: Secondary | ICD-10-CM | POA: Diagnosis not present

## 2023-09-01 DIAGNOSIS — Z79899 Other long term (current) drug therapy: Secondary | ICD-10-CM | POA: Insufficient documentation

## 2023-09-01 DIAGNOSIS — I120 Hypertensive chronic kidney disease with stage 5 chronic kidney disease or end stage renal disease: Secondary | ICD-10-CM | POA: Diagnosis not present

## 2023-09-01 DIAGNOSIS — Z992 Dependence on renal dialysis: Secondary | ICD-10-CM | POA: Diagnosis not present

## 2023-09-01 LAB — BASIC METABOLIC PANEL WITH GFR
Anion gap: 22 — ABNORMAL HIGH (ref 5–15)
BUN: 140 mg/dL — ABNORMAL HIGH (ref 6–20)
CO2: 15 mmol/L — ABNORMAL LOW (ref 22–32)
Calcium: 6.8 mg/dL — ABNORMAL LOW (ref 8.9–10.3)
Chloride: 98 mmol/L (ref 98–111)
Creatinine, Ser: 22.5 mg/dL — ABNORMAL HIGH (ref 0.61–1.24)
GFR, Estimated: 2 mL/min — ABNORMAL LOW (ref >60)
Glucose, Bld: 103 mg/dL — ABNORMAL HIGH (ref 70–99)
Potassium: 6.9 mmol/L (ref 3.5–5.1)
Sodium: 135 mmol/L (ref 135–145)

## 2023-09-01 LAB — CBC WITH DIFFERENTIAL/PLATELET
Abs Immature Granulocytes: 0.04 10*3/uL (ref 0.00–0.07)
Basophils Absolute: 0 10*3/uL (ref 0.0–0.1)
Basophils Relative: 1 %
Eosinophils Absolute: 0.1 10*3/uL (ref 0.0–0.5)
Eosinophils Relative: 1 %
HCT: 25 % — ABNORMAL LOW (ref 39.0–52.0)
Hemoglobin: 8.5 g/dL — ABNORMAL LOW (ref 13.0–17.0)
Immature Granulocytes: 1 %
Lymphocytes Relative: 11 %
Lymphs Abs: 0.9 10*3/uL (ref 0.7–4.0)
MCH: 34.6 pg — ABNORMAL HIGH (ref 26.0–34.0)
MCHC: 34 g/dL (ref 30.0–36.0)
MCV: 101.6 fL — ABNORMAL HIGH (ref 80.0–100.0)
Monocytes Absolute: 0.8 10*3/uL (ref 0.1–1.0)
Monocytes Relative: 9 %
Neutro Abs: 6.6 10*3/uL (ref 1.7–7.7)
Neutrophils Relative %: 77 %
Platelets: 229 10*3/uL (ref 150–400)
RBC: 2.46 MIL/uL — ABNORMAL LOW (ref 4.22–5.81)
RDW: 13.7 % (ref 11.5–15.5)
WBC: 8.5 10*3/uL (ref 4.0–10.5)
nRBC: 0 % (ref 0.0–0.2)

## 2023-09-01 LAB — POTASSIUM: Potassium: 3.3 mmol/L — ABNORMAL LOW (ref 3.5–5.1)

## 2023-09-01 LAB — HEPATITIS B SURFACE ANTIGEN: Hepatitis B Surface Ag: NONREACTIVE

## 2023-09-01 MED ORDER — IPRATROPIUM-ALBUTEROL 0.5-2.5 (3) MG/3ML IN SOLN
3.0000 mL | Freq: Once | RESPIRATORY_TRACT | Status: AC
  Administered 2023-09-01: 3 mL via RESPIRATORY_TRACT
  Filled 2023-09-01: qty 3

## 2023-09-01 MED ORDER — SODIUM ZIRCONIUM CYCLOSILICATE 10 G PO PACK
10.0000 g | PACK | Freq: Every day | ORAL | Status: DC
  Administered 2023-09-01: 10 g via ORAL
  Filled 2023-09-01: qty 1

## 2023-09-01 MED ORDER — CHLORHEXIDINE GLUCONATE CLOTH 2 % EX PADS: 6.0000 | MEDICATED_PAD | Freq: Every day | CUTANEOUS | Status: DC

## 2023-09-01 MED ORDER — LIDOCAINE HCL (PF) 1 % IJ SOLN: 5.0000 mL | INTRAMUSCULAR | Status: DC | PRN

## 2023-09-01 MED ORDER — HEPARIN SODIUM (PORCINE) 1000 UNIT/ML DIALYSIS
2400.0000 [IU] | Freq: Once | INTRAMUSCULAR | Status: DC
  Administered 2023-09-01: 2400 [IU] via INTRAVENOUS_CENTRAL

## 2023-09-01 MED ORDER — LIDOCAINE-PRILOCAINE 2.5-2.5 % EX CREA: 1.0000 | TOPICAL_CREAM | CUTANEOUS | Status: DC | PRN

## 2023-09-01 MED ORDER — NEPRO/CARBSTEADY PO LIQD: 237.0000 mL | ORAL | Status: DC | PRN

## 2023-09-01 MED ORDER — PENTAFLUOROPROP-TETRAFLUOROETH EX AERO: 1.0000 | INHALATION_SPRAY | CUTANEOUS | Status: DC | PRN

## 2023-09-01 MED ORDER — DARBEPOETIN ALFA 40 MCG/0.4ML IJ SOSY: 40.0000 ug | PREFILLED_SYRINGE | Freq: Once | INTRAMUSCULAR | Status: DC

## 2023-09-01 MED ORDER — HEPARIN SODIUM (PORCINE) 1000 UNIT/ML DIALYSIS: 1000.0000 [IU] | INTRAMUSCULAR | Status: DC | PRN

## 2023-09-01 NOTE — ED Notes (Signed)
 Phlebotomy at bedside.

## 2023-09-01 NOTE — ED Triage Notes (Signed)
 Pt states he is here for dialysis, states his last dialysis was a week ago. Pt states he feels sick and is nauseated.

## 2023-09-01 NOTE — Procedures (Cosign Needed Addendum)
 Asked to see this patient for hospital dialysis. Pt was discharged from his outpatient in March 2025 for behavior issues. The plan will be for "ED HD". Pt is not to be admitted at this time. Pt will go to the dialysis unit when they are ready for the patient. When dialysis is completed pt will be sent back to ED for reassessment.    Last OP HD unit info --> just released 07/2023 from [facility] 4h  B400  76.5kg  2K bath    RUA AVF  Heparin  2400    Clinical issues: 1) Anemia of esrd:  - Pt dose not have an outpatient nephrologist or HD unit so does not have access to ESA and other needed medications.  - recent Hb in April are in the 7- 9 range - last dose was darbe 40 mcg on 4/14 - got 500mg  venofer  IV 3/10 - tsat 31% and ferritin 1175 on 07/21/23 - will plan for darbe 40 mcg today up in HD  I was present at the procedure, reviewed the HD regimen and made appropriate changes.   Larry Poag MD  [facility] 09/01/2023, 2:56 PM  ADDENDUM = Pt had  2 episodes of LOC on HD  and uf was stopped and HD cut short with 40 min left ., He never had Arrhythmias noted on telemetry ,  or drop in bp, had 2 l uf . Each episode needed  hard sternal rub by male  HD  RN  before pt regained  consciousness .  Sending back to ER  for monitoring .

## 2023-09-01 NOTE — Significant Event (Signed)
 Rapid Response Event Note   Reason for Call :  Unresponsive  Initial Focused Assessment:  Patient is currently in dialysis.  Per RN he has received 2 hours 10 min of his treatment.   Patient is currently awake and oriented.  He endorses that he is short of breath but denies chest pain or abdominal pain.  He also denies HA.  Per HD staff he has had 2 unresponsive episodes during this treatment.  After the 1st episode they stopped pulling fluid and only cleaning blood.  After the 2nd episode they stopped treatment.  Lung sounds are clear He does have an occasional cough which he states he has had for a few days.    BP 119/90,  134/86,  123/87 HR 86 RR 17-19 O2 sat 90-92% on 4L [place]   Interventions:  Discontinued HD treatment.   Transport back to the ED  Plan of Care:     Event Summary:   MD Notified: Myrtie Atkinson PA at bedside Call Time: 1733 Arrival Time: 1735 End Time: 1810  Waldemar Guillaume, RN

## 2023-09-01 NOTE — ED Provider Notes (Addendum)
 [name] EMERGENCY DEPARTMENT AT [facility] Provider Note   CSN: [id number] Arrival date & time: 09/01/23  7846     History  Chief Complaint  Patient presents with   needs dialysis    Paul [name] is a 58 y.o. male history of ESRD on dialysis presented for dialysis.  Patient states that last night he had an episode of emesis and that he feels generally unwell but states that this is normal for him when he needs to get dialyzed.  Patient denies any chest pain, shortness of breath, hematemesis, abdominal pain.  Patient states he is no longer nauseous at the moment and is here just for dialysis.    Home Medications Prior to Admission medications   Medication Sig Start Date End Date Taking? Authorizing Provider  carvedilol  (COREG ) 6.25 MG tablet Take 1 tablet (6.25 mg total) by mouth 2 (two) times daily with a meal. 07/29/23 08/28/23  [name], Roya, DO  isosorbide  mononitrate (IMDUR ) 30 MG 24 hr tablet Take 1 tablet (30 mg total) by mouth daily. 07/29/23   [name], Roya, DO  ketoconazole  (NIZORAL ) 2 % cream Apply 1 Application topically daily. Patient not taking: Reported on 08/26/2023 07/08/23   Paul [name], DPM  lidocaine -prilocaine  (EMLA ) cream Apply 1 a small amount to skin three times a week apply to AVF 30-60 min prior to dialysis, wrap site w plastic wrap Patient not taking: Reported on 07/08/2023 09/12/22     rosuvastatin  (CRESTOR ) 5 MG tablet Take 1 tablet (5 mg total) by mouth daily. 03/24/23     sucroferric oxyhydroxide (VELPHORO ) 500 MG chewable tablet Chew 3 tablets (1,500 mg total) by mouth 3 (three) times daily with meals. 07/29/23   [name], Roya, DO      Allergies    Penicillin g    Review of Systems   Review of Systems  Physical Exam Updated Vital Signs BP (!) 208/110 (BP Location: Left Arm)   Pulse 84   Temp 97.8 F (36.6 C)   Resp 18   Ht 5\' 3"  (1.6 m)   Wt 80 kg   SpO2 100%   BMI 31.24 kg/m  Physical Exam Vitals reviewed.   Constitutional:      General: He is not in acute distress.    Comments: Sleeping comfortably in the bed  HENT:     Head: Normocephalic and atraumatic.  Eyes:     Extraocular Movements: Extraocular movements intact.     Conjunctiva/sclera: Conjunctivae normal.     Pupils: Pupils are equal, round, and reactive to light.  Cardiovascular:     Rate and Rhythm: Normal rate and regular rhythm.     Pulses: Normal pulses.     Heart sounds: Normal heart sounds.     Comments: 2+ bilateral radial/dorsalis pedis pulses with regular rate Pulmonary:     Effort: Pulmonary effort is normal. No respiratory distress.     Breath sounds: Normal breath sounds.  Abdominal:     Palpations: Abdomen is soft.     Tenderness: There is no abdominal tenderness. There is no guarding or rebound.  Musculoskeletal:        General: Normal range of motion.     Cervical back: Normal range of motion and neck supple.     Comments: 5 out of 5 bilateral grip/leg extension strength  Skin:    General: Skin is warm and dry.     Capillary Refill: Capillary refill takes less than 2 seconds.  Neurological:     General:  No focal deficit present.     Mental Status: He is alert and oriented to person, place, and time.     Comments: Sensation intact in all 4 limbs  Psychiatric:        Mood and Affect: Mood normal.     ED Results / Procedures / Treatments   Labs (all labs ordered are listed, but only abnormal results are displayed) Labs Reviewed  BASIC METABOLIC PANEL WITH GFR  CBC WITH DIFFERENTIAL/PLATELET    EKG None  Radiology No results found.  Procedures .Critical Care  Performed by: Paul Finn, PA-C Authorized by: Paul Finn, PA-C   Critical care provider statement:    Critical care time (minutes):  30   Critical care time was exclusive of:  Separately billable procedures and treating other patients   Critical care was necessary to treat or prevent imminent or life-threatening  deterioration of the following conditions: hyperkalemia.   Critical care was time spent personally by me on the following activities:  Blood draw for specimens, development of treatment plan with patient or surrogate, discussions with consultants, evaluation of patient's response to treatment, examination of patient, obtaining history from patient or surrogate, review of old charts, re-evaluation of patient's condition, pulse oximetry, ordering and review of radiographic studies, ordering and review of laboratory studies and ordering and performing treatments and interventions   I assumed direction of critical care for this patient from another provider in my specialty: no     Care discussed with comment:  Nephrology     Medications Ordered in ED Medications - No data to display  ED Course/ Medical Decision Making/ A&P                                 Medical Decision Making Amount and/or Complexity of Data Reviewed Labs: ordered. ECG/medicine tests: ordered.  Risk Prescription drug management. Decision regarding hospitalization.   Paul [name] 58 y.o. presented today for dialysis. Working DDx that I considered at this time includes, but not limited to, electrolyte abnormalities, arrhythmias, emergent dialysis, dehydration, fluid overload, pleural effusions, uremic encephalopathy.  R/o DDx: arrhythmias, emergent dialysis, dehydration, fluid overload, pleural effusions, uremic encephalopathy: These are considered less likely due to history of present illness, physical exam, labs/imaging findings.  Review of prior external notes: None  Unique Tests and My Independent Interpretation:  CBC: pending BMP: pending  Social Determinants of Health: none  Discussion with Independent Historian: None  Discussion of Management of Tests:  [place], MD Nephrology  Risk: High: hospitalization or escalation of hospital-level care  Risk Stratification Score: None  Plan: On exam patient  was no acute distress but noticed to be hypertensive at 208/110.  Patient on exam has no abdominal tenderness or peritoneal signs and is no longer nauseous or vomiting.  Patient states that he had 1 episode yesterday and that this is normal for him when he knows he needs to get dialyzed.  I did offer further workup however patient Clines states he just wants to be dialyzed which is reasonable as he is most likely uremic requiring dialysis as patient is well-appearing and does not show any red flag symptoms.  Will obtain labs and reach out to nephrology.  Contacted nephrology and they agree with dialysis for the patient. Will await for Nephrology to transport patient for dialysis and anticipate discharge afterwards.  While waiting to get dialyzed patient's labs came back and he does have  a potassium of 6.9.  Will give Lokelma and check EKG. no EKG changes.  At this time patient will need to be dialyzed.  This chart was dictated using voice recognition software.  Despite best efforts to proofread,  errors can occur which can change the documentation meaning.        Final Clinical Impression(s) / ED Diagnoses Final diagnoses:  ESRD (end stage renal disease) on dialysis [facility])    Rx / DC Orders ED Discharge Orders     None         Elex Grimmer 09/01/23 1322    Arvilla Birmingham, MD 09/01/23 1555

## 2023-09-01 NOTE — ED Notes (Signed)
 Pt transported to dialysis

## 2023-09-01 NOTE — ED Notes (Signed)
 Consent signed by pt, witnessed by RN.

## 2023-09-01 NOTE — ED Provider Notes (Signed)
 Patient was seen initially by Suzann Ernst, PA-C, here for concerns of requiring dialysis.  Patient reports last night he had an episode of emesis started and has generally felt unwell but he reports this is typical for him around the time that he requires dialysis.  Patient is typically a Monday, Wednesday, Friday dialysis patient.  Patient awaiting reassessment after returning from dialysis. Physical Exam  BP 123/87   Pulse 85   Temp (!) 97.3 F (36.3 C) (Oral)   Resp 18   Ht 5\' 3"  (1.6 m)   Wt 80.5 kg Comment: Standing wt scale.  SpO2 100%   BMI 31.44 kg/m   Physical Exam Vitals reviewed.  Constitutional:      General: He is not in acute distress.    Appearance: He is normal weight. He is not ill-appearing, toxic-appearing or diaphoretic.     Comments: Sleeping comfortably in the bed  HENT:     Head: Normocephalic and atraumatic.  Eyes:     Extraocular Movements: Extraocular movements intact.     Conjunctiva/sclera: Conjunctivae normal.     Pupils: Pupils are equal, round, and reactive to light.  Cardiovascular:     Rate and Rhythm: Normal rate and regular rhythm.     Pulses: Normal pulses.     Heart sounds: Normal heart sounds.     Comments: 2+ bilateral radial/dorsalis pedis pulses with regular rate Pulmonary:     Effort: Pulmonary effort is normal. No respiratory distress.     Breath sounds: Normal breath sounds.  Abdominal:     Palpations: Abdomen is soft.     Tenderness: There is no abdominal tenderness. There is no guarding or rebound.  Musculoskeletal:        General: Normal range of motion.     Cervical back: Normal range of motion and neck supple.     Comments: 5 out of 5 bilateral grip/leg extension strength  Skin:    General: Skin is warm and dry.     Capillary Refill: Capillary refill takes less than 2 seconds.  Neurological:     General: No focal deficit present.     Mental Status: He is alert and oriented to person, place, and time.     Comments: Sensation  intact in all 4 limbs  Psychiatric:        Mood and Affect: Mood normal.     Procedures  Procedures  ED Course / MDM    Medical Decision Making Amount and/or Complexity of Data Reviewed Labs: ordered. ECG/medicine tests: ordered.  Risk Prescription drug management. Decision regarding hospitalization.   On reassessment, patient denies any specific concerns.  Patient did have of note 2 syncopal type episodes during dialysis which is atypical for him but patient back to baseline.  Given notable hyperkalemia, will reassess potassium level to ensure that levels have dropped.  Repeat potassium at 3.3.  Will discharge patient home with plans for continued outpatient dialysis.  Return precautions discussed.  Otherwise stable at this time for outpatient follow-up and discharged home.       Lameshia Hypolite A, PA-C 09/01/23 2000    Hershel Los, MD 09/01/23 [phone]

## 2023-09-01 NOTE — Progress Notes (Addendum)
 Received patient in bed.Alert and oriented x 3.Consent verified.  Access used : Right  arm avf that worked well.  Duration of treatment:  2.5 hours- see hemo comment below.  Uf goal: 2.1 liters out of 4 liters prescribed ,see hemo comment.  Hemo comment:Not tolerating his treatment/uf?                           An hour and  left to his treatment,patient complained of his stomach problem,when asked by the staff ,he said that "I am nauseous, then suddenly patient tried to got up from semi -sitting to sitting position ,then suddenly become unresponsive,patient has a heart rate and breathing spontaneously,all vitals were normal at this time.Renal P.A at the bedside ,paged rapid response,hd staffs applied sternal pressure to patient. NS 200 cc bolus given as per Renal P.A. At this time patient gradually coming back to self ,he has had conciousness and was talking when rapid response arrived.Treatment resumed but no UF as ordered by Renal P.A.As per HD nurses ,this is not the first time it happened to him while he was receiving hd treatment.                           About 30 minutes left to his treatment and for the second time,patient exhibited the same symptoms physically ,as his hd nurse closely monitoring him,his LOC was decreasing,his left arm and abdomen was stiffening,eyes becomes lazy ,this this time loss of LOC was prevented ,with hd nurses tried to awakened him.HD treatment stopped and disconnected him from the machine.Renal P.A at the bedside.Rapid response paged.Patient seem confused,not recalling or on denial what was happening to him when updated by the nurses.He knows he's getting treatment ,unable to state the day and time.  Hand off to the patient's nurse: Brought back to ED by RR nurse and HD nurse hooked on tele-monitor and oxygen while on transport, to ED with stable medical condition .

## 2023-09-01 NOTE — Patient Outreach (Signed)
 Missed call from patient 08/29/23 at 7:35 PM. Patient leaves voicemail asking for return call regarding a concern that he has. Attempted to call patient back to gather more information. No answer and unable to leave voicemail. Will try again at another time.

## 2023-09-01 NOTE — Discharge Planning (Signed)
 RNCM contacted Renal Navigator regarding HD placement progress.  Jerry Morgans states that due to pt on-going non compliance with medications and previous clinic behaviors, he remains without and accepting OP HD Clinic. Renal Navigator continues to refer pt; but feels she will have better success with medication compliance.  Nelton Amsden J. Rachel Budds, RN, BSN, NCM  Transitions of Care  Nurse Case Manager  [facility] Emergency Departments  Operative Services  [phone]

## 2023-09-01 NOTE — Discharge Instructions (Addendum)
 You were seen today for concerns of needing dialysis.  Your lab work did show some concerns for significantly elevated potassium level at 6.9.  After dialysis, your level dropped down to 3.3.  Please continue with your home schedule for dialysis.  For any concerns of new or worsening symptoms, return to the emergency department.

## 2023-09-01 NOTE — ED Notes (Signed)
 Phlebotomy requested to obtain labs.

## 2023-09-02 LAB — HEPATITIS B SURFACE ANTIBODY, QUANTITATIVE: Hep B S AB Quant (Post): 12084 m[IU]/mL

## 2023-09-03 ENCOUNTER — Emergency Department (HOSPITAL_COMMUNITY)
Admission: EM | Admit: 2023-09-03 | Discharge: 2023-09-03 | Disposition: A | Discharge status: Home / Self Care | Attending: Emergency Medicine | Admitting: Emergency Medicine

## 2023-09-03 ENCOUNTER — Encounter (HOSPITAL_COMMUNITY): Payer: Self-pay | Admitting: *Deleted

## 2023-09-03 DIAGNOSIS — N186 End stage renal disease: Secondary | ICD-10-CM | POA: Insufficient documentation

## 2023-09-03 DIAGNOSIS — Z992 Dependence on renal dialysis: Secondary | ICD-10-CM | POA: Diagnosis not present

## 2023-09-03 DIAGNOSIS — D631 Anemia in chronic kidney disease: Secondary | ICD-10-CM | POA: Diagnosis not present

## 2023-09-03 DIAGNOSIS — I509 Heart failure, unspecified: Secondary | ICD-10-CM | POA: Diagnosis not present

## 2023-09-03 DIAGNOSIS — Z96641 Presence of right artificial hip joint: Secondary | ICD-10-CM | POA: Insufficient documentation

## 2023-09-03 DIAGNOSIS — I132 Hypertensive heart and chronic kidney disease with heart failure and with stage 5 chronic kidney disease, or end stage renal disease: Secondary | ICD-10-CM | POA: Diagnosis not present

## 2023-09-03 DIAGNOSIS — R112 Nausea with vomiting, unspecified: Secondary | ICD-10-CM

## 2023-09-03 DIAGNOSIS — D72829 Elevated white blood cell count, unspecified: Secondary | ICD-10-CM | POA: Insufficient documentation

## 2023-09-03 DIAGNOSIS — I120 Hypertensive chronic kidney disease with stage 5 chronic kidney disease or end stage renal disease: Secondary | ICD-10-CM | POA: Diagnosis not present

## 2023-09-03 LAB — CBC
HCT: 25 % — ABNORMAL LOW (ref 39.0–52.0)
Hemoglobin: 8.6 g/dL — ABNORMAL LOW (ref 13.0–17.0)
MCH: 34 pg (ref 26.0–34.0)
MCHC: 34.4 g/dL (ref 30.0–36.0)
MCV: 98.8 fL (ref 80.0–100.0)
Platelets: 267 10*3/uL (ref 150–400)
RBC: 2.53 MIL/uL — ABNORMAL LOW (ref 4.22–5.81)
RDW: 13.4 % (ref 11.5–15.5)
WBC: 16.5 10*3/uL — ABNORMAL HIGH (ref 4.0–10.5)
nRBC: 0 % (ref 0.0–0.2)

## 2023-09-03 LAB — COMPREHENSIVE METABOLIC PANEL WITH GFR
ALT: 11 U/L (ref 0–44)
AST: 15 U/L (ref 15–41)
Albumin: 3.7 g/dL (ref 3.5–5.0)
Alkaline Phosphatase: 49 U/L (ref 38–126)
Anion gap: 25 — ABNORMAL HIGH (ref 5–15)
BUN: 103 mg/dL — ABNORMAL HIGH (ref 6–20)
CO2: 17 mmol/L — ABNORMAL LOW (ref 22–32)
Calcium: 7.6 mg/dL — ABNORMAL LOW (ref 8.9–10.3)
Chloride: 91 mmol/L — ABNORMAL LOW (ref 98–111)
Creatinine, Ser: 17.51 mg/dL — ABNORMAL HIGH (ref 0.61–1.24)
GFR, Estimated: 3 mL/min — ABNORMAL LOW (ref >60)
Glucose, Bld: 119 mg/dL — ABNORMAL HIGH (ref 70–99)
Potassium: 4.4 mmol/L (ref 3.5–5.1)
Sodium: 133 mmol/L — ABNORMAL LOW (ref 135–145)
Total Bilirubin: 1.2 mg/dL (ref 0.0–1.2)
Total Protein: 8.1 g/dL (ref 6.5–8.1)

## 2023-09-03 MED ORDER — DARBEPOETIN ALFA 40 MCG/0.4ML IJ SOSY
40.0000 ug | PREFILLED_SYRINGE | Freq: Once | INTRAMUSCULAR | Status: AC
  Administered 2023-09-03: 40 ug via SUBCUTANEOUS
  Filled 2023-09-03: qty 0.4

## 2023-09-03 MED ORDER — ANTICOAGULANT SODIUM CITRATE 4% (200MG/5ML) IV SOLN: 5.0000 mL | Status: DC | PRN

## 2023-09-03 MED ORDER — LIDOCAINE-PRILOCAINE 2.5-2.5 % EX CREA: 1.0000 | TOPICAL_CREAM | CUTANEOUS | Status: DC | PRN

## 2023-09-03 MED ORDER — ALTEPLASE 2 MG IJ SOLR: 2.0000 mg | Freq: Once | INTRAMUSCULAR | Status: DC | PRN

## 2023-09-03 MED ORDER — PENTAFLUOROPROP-TETRAFLUOROETH EX AERO: 1.0000 | INHALATION_SPRAY | CUTANEOUS | Status: DC | PRN

## 2023-09-03 MED ORDER — LIDOCAINE HCL (PF) 1 % IJ SOLN: 5.0000 mL | INTRAMUSCULAR | Status: DC | PRN

## 2023-09-03 MED ORDER — NEPRO/CARBSTEADY PO LIQD: 237.0000 mL | ORAL | Status: DC | PRN

## 2023-09-03 MED ORDER — HEPARIN SODIUM (PORCINE) 1000 UNIT/ML DIALYSIS
2400.0000 [IU] | Freq: Once | INTRAMUSCULAR | Status: AC
  Administered 2023-09-03: 2400 [IU] via INTRAVENOUS_CENTRAL

## 2023-09-03 MED ORDER — HEPARIN SODIUM (PORCINE) 1000 UNIT/ML DIALYSIS: 1000.0000 [IU] | INTRAMUSCULAR | Status: DC | PRN

## 2023-09-03 MED ORDER — HEPARIN SODIUM (PORCINE) 1000 UNIT/ML IJ SOLN
INTRAMUSCULAR | Status: AC
  Filled 2023-09-03: qty 3

## 2023-09-03 MED ORDER — CHLORHEXIDINE GLUCONATE CLOTH 2 % EX PADS: 6.0000 | MEDICATED_PAD | Freq: Every day | CUTANEOUS | Status: DC

## 2023-09-03 NOTE — ED Notes (Signed)
Unable to locate patient for discharge.

## 2023-09-03 NOTE — Discharge Instructions (Signed)
 Please go to dialysis as scheduled   See your nephrologist and primary care doctor for follow up   Return to ER if you have shortness of breath, vomiting, severe abdominal pain

## 2023-09-03 NOTE — Progress Notes (Signed)
   09/03/23 1806  Vitals  Temp 98.3 F (36.8 C)  Pulse Rate 97  Resp 19  BP (!) 144/91  SpO2 100 %  O2 Device Room Air  Weight (S)  76.2 kg (Standing Scale)  Type of Weight Post-Dialysis  Oxygen Therapy  Patient Activity (if Appropriate) In bed  Post Treatment  Dialyzer Clearance Clear  Hemodialysis Intake (mL) 0 mL  Liters Processed 73.6  Fluid Removed (mL) 600 mL  Tolerated HD Treatment Yes  Post-Hemodialysis Comments Pt. tx initated without difficulties. Pt SBP decrease in the mid HD tx and PA Zefang instructions to turn UF off. Report was call to ED RN. admin Medication as order.  AVG/AVF Arterial Site Held (minutes) 10 minutes  AVG/AVF Venous Site Held (minutes) 5 minutes   Received patient in bed to unit.  Alert and oriented.  Informed consent signed and in chart.   TX duration: 3.25 hours  Patient tolerated well.  Transported back to the room  Alert, without acute distress.  Hand-off given to patient's nurse.   Access used: Yes Access issues: No  Total UF removed: 600 Medication(s) given: See MAR Post HD VS: See Above Grid Post HD weight: 76.2 kg   [facility]

## 2023-09-03 NOTE — ED Provider Notes (Signed)
  Physical Exam  BP (!) 144/91   Pulse 97   Temp 98.3 F (36.8 C)   Resp 19   Wt (S) 76.2 kg Comment: Standing Scale  SpO2 100%   BMI 29.76 kg/m   Physical Exam  Procedures  Procedures  ED Course / MDM    Medical Decision Making Patient came back from dialysis and felt better. Vitals stable, will dc home   Amount and/or Complexity of Data Reviewed Labs: ordered.          Dalene Duck, MD 09/03/23 [phone]

## 2023-09-03 NOTE — ED Triage Notes (Signed)
 Pt here for dialysis, last treatment on Monday. Reports emesis all day yesterday

## 2023-09-03 NOTE — ED Provider Notes (Signed)
 Emergency Department Provider Note   I have reviewed the triage vital signs and the nursing notes.   HISTORY  Chief Complaint Emesis   HPI Paul [name] is a 58 y.o. male with past history reviewed below including CHF and ESRD presents to the emergency department for his routine dialysis appointment.  He had some dry heaving last night and states this is not unusual when he is nearing the time of his dialysis appointments.  He denies abdominal pain or chest discomfort.  No fevers.  No hematemesis.  Denies any alcohol or drug use.  He tells me his goal is to become more compliant with his dialysis but his last's appointment was 2 days prior.  He had some hyperkalemia during that visit which improved after HD. He was able to keep down fluids this AM without difficulty.    Past Medical History:  Diagnosis Date   Bipolar disorder (HCC)    Central retinal artery occlusion    with macular infarction of the right eye. status post posterior vitrectomy and photocoagulation with insertion of a shunt in 2006.   CHF (congestive heart failure) (HCC)    chronic mixed syst/diast. 02/2009 echo: Systolic function was mildly reduced. The estimated ejection fraction was in 45%, global HK, Grade I Diast dysfxn.   Chronic kidney disease    ESRD   Heart failure    Obesity    Osteoarthritis    s/p right hip replacement in 2001   Schizophrenia (HCC)    Severe uncontrolled hypertension     Review of Systems  Constitutional: No fever/chills Cardiovascular: Denies chest pain. Respiratory: Denies shortness of breath. Gastrointestinal: No abdominal pain. Positive vomiting.  No diarrhea.   Skin: Negative for rash. Neurological: Negative for headaches.  ____________________________________________   PHYSICAL EXAM:  VITAL SIGNS: ED Triage Vitals  Encounter Vitals Group     BP 09/03/23 0638 118/79     Pulse Rate 09/03/23 0638 (!) 102     Resp 09/03/23 0638 18     Temp 09/03/23 0638 97.9  F (36.6 C)     Temp src --      SpO2 09/03/23 0638 95 %   Constitutional: Alert and oriented. Well appearing and in no acute distress. Eyes: Conjunctivae are normal.  Head: Atraumatic. Nose: No congestion/rhinnorhea. Mouth/Throat: Mucous membranes are moist.   Neck: No stridor.   Cardiovascular: Normal rate, regular rhythm. Good peripheral circulation. Grossly normal heart sounds.   Respiratory: Normal respiratory effort.  No retractions. Lungs CTAB. Gastrointestinal: Soft and nontender. No distention.  Musculoskeletal: No gross deformities of extremities. Neurologic:  Normal speech and language.  Skin:  Skin is warm, dry and intact. No rash noted.  ____________________________________________   LABS (all labs ordered are listed, but only abnormal results are displayed)  Labs Reviewed  COMPREHENSIVE METABOLIC PANEL WITH GFR - Abnormal; Notable for the following components:      Result Value   Sodium 133 (*)    Chloride 91 (*)    CO2 17 (*)    Glucose, Bld 119 (*)    BUN 103 (*)    Creatinine, Ser 17.51 (*)    Calcium  7.6 (*)    GFR, Estimated 3 (*)    Anion gap 25 (*)    All other components within normal limits  CBC - Abnormal; Notable for the following components:   WBC 16.5 (*)    RBC 2.53 (*)    Hemoglobin 8.6 (*)    HCT 25.0 (*)  All other components within normal limits   ____________________________________________   PROCEDURES  Procedure(s) performed:   Procedures  None  ____________________________________________   INITIAL IMPRESSION / ASSESSMENT AND PLAN / ED COURSE  Pertinent labs & imaging results that were available during my care of the patient were reviewed by me and considered in my medical decision making (see chart for details).   This patient is Presenting for Evaluation of nausea/routine HD, which does require a range of treatment options, and is a complaint that involves a high risk of morbidity and mortality.  The Differential  Diagnoses include AKI, electrolyte disturbance, dehydration, etc.  Critical Interventions-    Medications  Chlorhexidine  Gluconate Cloth 2 % PADS 6 each (has no administration in time range)    Reassessment after intervention:  no vomiting in the ED.   Clinical Laboratory Tests Ordered, included normal potassium 4.4.  Leukocytosis to 16.5 without clear infection symptoms.  May be combination of slight volume contraction in the setting of GI losses.   Radiologic Tests: Considered CT abdomen/pelvis but abdomen soft, and non-tender. Will follow labs but defer imaging for now.   Cardiac Monitor Tracing which shows NSR.    Social Determinants of Health Risk patient is a non-smoker.   Consult complete with Nephrology. Spoke with Dr. Zana Hesselbach who will arrange HD.   Medical Decision Making: Summary:  The patient presents to the emergency department with nausea, vomiting, need for routine dialysis.  He receives his HD through the ED.  Abdomen diffusely soft and nontender.  Well-appearing.  Awake/alert.  Plan for screening blood work and will speak with nephrology about HD.  He often becomes nauseated and has vomiting around the time of HD according to him. Tolerating PO this AM.   Reevaluation with update and discussion with patient. No vomiting. Will go for HD.   Considered admission but symptoms controlled and can likely d/c after HD.   Patient's presentation is most consistent with acute presentation with potential threat to life or bodily function.   Disposition: to HD  ____________________________________________  FINAL CLINICAL IMPRESSION(S) / ED DIAGNOSES  Final diagnoses:  Nausea and vomiting, unspecified vomiting type  ESRD needing dialysis [facility])    Note:  This document was prepared using Dragon voice recognition software and may include unintentional dictation errors.  Abby Hocking, MD, [facility] Emergency Medicine    Braydee Shimkus, Shereen Dike, MD 09/03/23 [phone]

## 2023-09-03 NOTE — Progress Notes (Addendum)
 Pt known to navigator due to pt's d/c from local HD clinic due to behaviors. Pt's chart indicates that pt is not currently taking prescribed medications as prescribed by psychiatric staff. Pt's  chart also indicates that pt does not intend to f/u with mental health providers in the community. [facility] social work Haematologist have reached out to navigator to say that pt may be able to be re-considered for admission to a local clinic if pt is mentally stable and receiving proper f/u. Pt would likely need to be compliant with medications as well. Navigator requested mental health resource sheet from [facility] social worker to provide to pt when pt receiving HD and navigator is available to meet with pt. Pt receiving HD in the inpt unit this afternoon. Met with pt at bedside. Introduced self and explained role. Discussed with pt the importance of out-pt mental health f/u and medication compliance so pt can be re-evaluated for out-pt clinic placement. Pt confirms he has not been taking psychiatric meds and that he does not like seeing a psychiatrist. Again, navigator reiterated the importance of both for clinic placement. Pt was provided mental health resource sheet that was obtained from [facility] CSW. Explained to pt that it would be in his best interest to review list and make an appt with his preferred provider. Navigator offered to assist pt with making an appt once he picks a provider if assistance is needed in that area. Navigator's name and number provided to pt but also advised pt that navigator can try to meet with pt while receiving HD as well if needed. Pt voices understanding to above info and conversation. Pt really wants to return to his out-pt HD clinic and does voice remorse for his actions.   Lauraine Polite Renal Navigator [phone]

## 2023-09-03 NOTE — Procedures (Signed)
 Asked to see this patient for hospital dialysis. Pt was discharged from his outpatient recently for behavior issues. The plan will be for "ED HD". Pt is not to be admitted at this time. Pt will go to the dialysis unit when they are ready for the patient. When dialysis is completed pt will be sent back to ED for reassessment.     Last OP HD unit info --> just released 07/2023 from [facility] 4h  B400  76.5kg  2K bath    RUA AVF  Heparin  2400    Clinical issues other than volume: 1) Anemia of esrd:  - Pt dose not have an outpatient nephrologist or HD unit so does not have access to ESA and other needed medications.  - recent Hb in mid April were in the 7- 9 range - of 4 orders for ESA in march- April, only 1 was given, darbe 40 mcg on 08/18/23 - has had 1 order of 500mg  IV Fe on 3/10 which was not given - tsat 31% and ferritin 1175 on 07/21/23   Plan -  - needs darbe 40 mcg q 1-2 wks for now - 2 wks since last esa, Hb 8.6 today --> reorder darbe 40 mcg sq w/ hd today - fe labs okay 1 month ago, follow - goal Hb 9- 11 range  I was present at the procedure, reviewed the HD regimen and made appropriate changes.   Larry Poag MD  [facility] 09/03/2023, 3:12 PM

## 2023-09-04 NOTE — Progress Notes (Signed)
 Received a call from pt today requesting assistance with making an appt at [facility] the [place]. Pt states he called this morning and left a message. Navigator confirmed with pt his desire for navigator to assist with appt. Pt confirms that to be correct. Contacted [facility] and left a message requesting a return call in order to assist pt with making an appt. Will await a return call.   Lauraine Polite Renal Navigator [phone]

## 2023-09-05 ENCOUNTER — Emergency Department (HOSPITAL_COMMUNITY)
Admission: EM | Admit: 2023-09-05 | Discharge: 2023-09-06 | Discharge status: Against Medical Advice | Attending: Emergency Medicine | Admitting: Emergency Medicine

## 2023-09-05 ENCOUNTER — Other Ambulatory Visit: Payer: Self-pay

## 2023-09-05 DIAGNOSIS — D631 Anemia in chronic kidney disease: Secondary | ICD-10-CM | POA: Diagnosis not present

## 2023-09-05 DIAGNOSIS — R110 Nausea: Secondary | ICD-10-CM | POA: Diagnosis present

## 2023-09-05 DIAGNOSIS — Z992 Dependence on renal dialysis: Secondary | ICD-10-CM | POA: Diagnosis not present

## 2023-09-05 DIAGNOSIS — N186 End stage renal disease: Secondary | ICD-10-CM | POA: Insufficient documentation

## 2023-09-05 DIAGNOSIS — Z5329 Procedure and treatment not carried out because of patient's decision for other reasons: Secondary | ICD-10-CM | POA: Diagnosis not present

## 2023-09-05 DIAGNOSIS — R112 Nausea with vomiting, unspecified: Secondary | ICD-10-CM | POA: Diagnosis not present

## 2023-09-05 LAB — CBC WITH DIFFERENTIAL/PLATELET
Abs Immature Granulocytes: 0.18 10*3/uL — ABNORMAL HIGH (ref 0.00–0.07)
Basophils Absolute: 0.1 10*3/uL (ref 0.0–0.1)
Basophils Relative: 1 %
Eosinophils Absolute: 0.2 10*3/uL (ref 0.0–0.5)
Eosinophils Relative: 2 %
HCT: 22.2 % — ABNORMAL LOW (ref 39.0–52.0)
Hemoglobin: 7.5 g/dL — ABNORMAL LOW (ref 13.0–17.0)
Immature Granulocytes: 2 %
Lymphocytes Relative: 10 %
Lymphs Abs: 1 10*3/uL (ref 0.7–4.0)
MCH: 34.1 pg — ABNORMAL HIGH (ref 26.0–34.0)
MCHC: 33.8 g/dL (ref 30.0–36.0)
MCV: 100.9 fL — ABNORMAL HIGH (ref 80.0–100.0)
Monocytes Absolute: 1.4 10*3/uL — ABNORMAL HIGH (ref 0.1–1.0)
Monocytes Relative: 13 %
Neutro Abs: 7.5 10*3/uL (ref 1.7–7.7)
Neutrophils Relative %: 72 %
Platelets: 277 10*3/uL (ref 150–400)
RBC: 2.2 MIL/uL — ABNORMAL LOW (ref 4.22–5.81)
RDW: 13.3 % (ref 11.5–15.5)
WBC: 10.3 10*3/uL (ref 4.0–10.5)
nRBC: 0 % (ref 0.0–0.2)

## 2023-09-05 LAB — RENAL FUNCTION PANEL
Albumin: 3.3 g/dL — ABNORMAL LOW (ref 3.5–5.0)
Anion gap: 18 — ABNORMAL HIGH (ref 5–15)
BUN: 68 mg/dL — ABNORMAL HIGH (ref 6–20)
CO2: 23 mmol/L (ref 22–32)
Calcium: 7.2 mg/dL — ABNORMAL LOW (ref 8.9–10.3)
Chloride: 94 mmol/L — ABNORMAL LOW (ref 98–111)
Creatinine, Ser: 13.62 mg/dL — ABNORMAL HIGH (ref 0.61–1.24)
GFR, Estimated: 4 mL/min — ABNORMAL LOW (ref >60)
Glucose, Bld: 83 mg/dL (ref 70–99)
Phosphorus: 7.7 mg/dL — ABNORMAL HIGH (ref 2.5–4.6)
Potassium: 4.3 mmol/L (ref 3.5–5.1)
Sodium: 135 mmol/L (ref 135–145)

## 2023-09-05 LAB — IRON AND TIBC
Iron: 50 ug/dL (ref 45–182)
Saturation Ratios: 24 % (ref 17.9–39.5)
TIBC: 206 ug/dL — ABNORMAL LOW (ref 250–450)
UIBC: 156 ug/dL

## 2023-09-05 LAB — FERRITIN: Ferritin: 1828 ng/mL — ABNORMAL HIGH (ref 24–336)

## 2023-09-05 LAB — HEPATITIS B SURFACE ANTIGEN: Hepatitis B Surface Ag: NONREACTIVE

## 2023-09-05 MED ORDER — LIDOCAINE-PRILOCAINE 2.5-2.5 % EX CREA: 1.0000 | TOPICAL_CREAM | CUTANEOUS | Status: DC | PRN

## 2023-09-05 MED ORDER — HEPARIN SODIUM (PORCINE) 1000 UNIT/ML IJ SOLN
INTRAMUSCULAR | Status: AC
  Filled 2023-09-05: qty 4

## 2023-09-05 MED ORDER — HEPARIN SODIUM (PORCINE) 1000 UNIT/ML DIALYSIS
2400.0000 [IU] | Freq: Once | INTRAMUSCULAR | Status: AC
  Administered 2023-09-05: 2400 [IU] via INTRAVENOUS_CENTRAL

## 2023-09-05 MED ORDER — HEPARIN SODIUM (PORCINE) 1000 UNIT/ML DIALYSIS: 1000.0000 [IU] | INTRAMUSCULAR | Status: DC | PRN

## 2023-09-05 MED ORDER — ALTEPLASE 2 MG IJ SOLR: 2.0000 mg | Freq: Once | INTRAMUSCULAR | Status: DC | PRN

## 2023-09-05 MED ORDER — CHLORHEXIDINE GLUCONATE CLOTH 2 % EX PADS: 6.0000 | MEDICATED_PAD | Freq: Every day | CUTANEOUS | Status: DC

## 2023-09-05 MED ORDER — LIDOCAINE HCL (PF) 1 % IJ SOLN: 5.0000 mL | INTRAMUSCULAR | Status: DC | PRN

## 2023-09-05 MED ORDER — NEPRO/CARBSTEADY PO LIQD: 237.0000 mL | ORAL | Status: DC | PRN

## 2023-09-05 MED ORDER — ANTICOAGULANT SODIUM CITRATE 4% (200MG/5ML) IV SOLN: 5.0000 mL | Status: DC | PRN

## 2023-09-05 MED ORDER — PENTAFLUOROPROP-TETRAFLUOROETH EX AERO: 1.0000 | INHALATION_SPRAY | CUTANEOUS | Status: DC | PRN

## 2023-09-05 NOTE — ED Notes (Signed)
 Dietary contacted for lunch tray for pt.

## 2023-09-05 NOTE — Procedures (Signed)
 I was present at the procedure, reviewed the HD regimen and made appropriate changes.   Larry Poag MD  [facility] 09/05/2023, 5:04 PM

## 2023-09-05 NOTE — ED Triage Notes (Addendum)
 Patient is here for his routine hemodialysis treatment. Last treatment Wednesday .

## 2023-09-05 NOTE — ED Notes (Signed)
 Dialysis unit aware patient is here.

## 2023-09-05 NOTE — ED Provider Notes (Signed)
 [place] EMERGENCY DEPARTMENT AT [facility] Provider Note   CSN: [id number] Arrival date & time: 09/05/23  4098     History  Chief Complaint  Patient presents with   Hemodialysis    Paul [name] is a 58 y.o. male.  HPI Patient presents with need for dialysis.  No dyspnea, mild nausea, no vomiting.  No fever, other complaints.  Patient receives dialysis at the hospital, unclear reasons.  Last dialysis 2 days ago as scheduled.    Home Medications Prior to Admission medications   Medication Sig Start Date End Date Taking? Authorizing Provider  carvedilol  (COREG ) 6.25 MG tablet Take 1 tablet (6.25 mg total) by mouth 2 (two) times daily with a meal. Patient taking differently: Take 6.25 mg by mouth daily. 07/29/23 09/05/23 Yes Tawkaliyar, Roya, DO  isosorbide  mononitrate (IMDUR ) 30 MG 24 hr tablet Take 1 tablet (30 mg total) by mouth daily. 07/29/23  Yes Tawkaliyar, Roya, DO  rosuvastatin  (CRESTOR ) 5 MG tablet Take 1 tablet (5 mg total) by mouth daily. 03/24/23  Yes   sucroferric oxyhydroxide (VELPHORO ) 500 MG chewable tablet Chew 3 tablets (1,500 mg total) by mouth 3 (three) times daily with meals. Patient taking differently: Chew 500 mg by mouth in the morning and at bedtime. 07/29/23  Yes Tawkaliyar, Roya, DO  OLANZapine  zydis (ZYPREXA ) 15 MG disintegrating tablet Take 15 mg by mouth at bedtime. Patient not taking: Reported on 09/03/2023    [provider]      Allergies    Penicillin g    Review of Systems   Review of Systems  Physical Exam Updated Vital Signs BP (!) 183/86 (BP Location: Left Arm)   Pulse 96   Temp 97.8 F (36.6 C)   Resp 17   SpO2 91%  Physical Exam Vitals and nursing note reviewed.  Constitutional:      General: He is not in acute distress.    Appearance: He is well-developed.  HENT:     Head: Normocephalic and atraumatic.  Eyes:     Conjunctiva/sclera: Conjunctivae normal.  Pulmonary:     Effort: Pulmonary effort is  normal. No respiratory distress.     Breath sounds: No stridor.  Abdominal:     General: There is no distension.  Skin:    General: Skin is warm and dry.  Neurological:     Mental Status: He is alert and oriented to person, place, and time.     ED Results / Procedures / Treatments   Labs (all labs ordered are listed, but only abnormal results are displayed) Labs Reviewed  HEPATITIS B SURFACE ANTIGEN  HEPATITIS B SURFACE ANTIBODY, QUANTITATIVE    EKG None  Radiology No results found.  Procedures Procedures    Medications Ordered in ED Medications  Chlorhexidine  Gluconate Cloth 2 % PADS 6 each (0 each Topical Hold 09/05/23 0855)    ED Course/ Medical Decision Making/ A&P                                 Medical Decision Making Patient presents with need for dialysis.  He has mild nausea, but is hemodynamically from hypertension.  No evidence for decompensated state, no increased work of breathing suggesting compromised respiratory function.  Vitals unremarkable, patient's case discussed with nephrology to facilitate dialysis.  Amount and/or Complexity of Data Reviewed External Data Reviewed: notes.  Risk Decision regarding hospitalization. Diagnosis or treatment significantly limited by social determinants  of health.   Final Clinical Impression(s) / ED Diagnoses Final diagnoses:  Nausea     Dorenda Gandy, MD 09/05/23 1102

## 2023-09-05 NOTE — Progress Notes (Signed)
 Asked to see this patient for hospital dialysis. Pt was discharged from his outpatient recently for behavior issues. The plan will be for "ED HD". Pt is not to be admitted at this time. Pt will go to the dialysis unit when they are ready for the patient. When dialysis is completed pt will be sent back to ED for reassessment.     Last OP HD unit info --> just released 07/2023 from [facility] 4[facility]  B400  76.5kg  2K bath    RUA AVF  Heparin  2400    Clinical issues other than volume: 1) Anemia of esrd:  - pt dose not have a nephrologist or HD unit so does not have access to ESA or IV iron  other than when here for his hospital HD sessions - recent Hb in mid April were in the 7- 9 range - of 5 orders for ESA in march- April, only 2 were given - darbe 40 mcg 08/18/23 - darbe 40 mcg 09/03/23 - tsat 31% and ferritin 1175 on 07/21/23   Plan -  - last ESA darbe 40 mcg on 09/03/23, follow - fe labs okay 1 month ago - will repeat today - goal Hb 9- 11 range

## 2023-09-06 ENCOUNTER — Other Ambulatory Visit (HOSPITAL_COMMUNITY): Payer: Self-pay

## 2023-09-06 LAB — HEPATITIS B SURFACE ANTIBODY, QUANTITATIVE: Hep B S AB Quant (Post): 10949 m[IU]/mL

## 2023-09-08 ENCOUNTER — Emergency Department (HOSPITAL_COMMUNITY)
Admission: EM | Admit: 2023-09-08 | Discharge: 2023-09-08 | Discharge status: Against Medical Advice | Attending: Emergency Medicine | Admitting: Emergency Medicine

## 2023-09-08 ENCOUNTER — Other Ambulatory Visit: Payer: Self-pay

## 2023-09-08 ENCOUNTER — Encounter (HOSPITAL_COMMUNITY): Payer: Self-pay | Admitting: Emergency Medicine

## 2023-09-08 ENCOUNTER — Other Ambulatory Visit (HOSPITAL_COMMUNITY): Payer: Self-pay

## 2023-09-08 DIAGNOSIS — N179 Acute kidney failure, unspecified: Secondary | ICD-10-CM | POA: Insufficient documentation

## 2023-09-08 DIAGNOSIS — Z5329 Procedure and treatment not carried out because of patient's decision for other reasons: Secondary | ICD-10-CM | POA: Insufficient documentation

## 2023-09-08 LAB — CBC WITH DIFFERENTIAL/PLATELET
Abs Immature Granulocytes: 0 10*3/uL (ref 0.00–0.07)
Basophils Absolute: 0.1 10*3/uL (ref 0.0–0.1)
Basophils Relative: 1 %
Eosinophils Absolute: 0.2 10*3/uL (ref 0.0–0.5)
Eosinophils Relative: 2 %
HCT: 25.4 % — ABNORMAL LOW (ref 39.0–52.0)
Hemoglobin: 7.9 g/dL — ABNORMAL LOW (ref 13.0–17.0)
Lymphocytes Relative: 9 %
Lymphs Abs: 1 10*3/uL (ref 0.7–4.0)
MCH: 33.5 pg (ref 26.0–34.0)
MCHC: 31.1 g/dL (ref 30.0–36.0)
MCV: 107.6 fL — ABNORMAL HIGH (ref 80.0–100.0)
Monocytes Absolute: 0.6 10*3/uL (ref 0.1–1.0)
Monocytes Relative: 5 %
Neutro Abs: 9.3 10*3/uL — ABNORMAL HIGH (ref 1.7–7.7)
Neutrophils Relative %: 83 %
Platelets: 286 10*3/uL (ref 150–400)
RBC: 2.36 MIL/uL — ABNORMAL LOW (ref 4.22–5.81)
RDW: 13.8 % (ref 11.5–15.5)
WBC: 11.2 10*3/uL — ABNORMAL HIGH (ref 4.0–10.5)
nRBC: 0 % (ref 0.0–0.2)
nRBC: 0 /100{WBCs}

## 2023-09-08 LAB — POTASSIUM: Potassium: 4.9 mmol/L (ref 3.5–5.1)

## 2023-09-08 LAB — COMPREHENSIVE METABOLIC PANEL WITH GFR
ALT: 8 U/L (ref 0–44)
AST: 15 U/L (ref 15–41)
Albumin: 3.3 g/dL — ABNORMAL LOW (ref 3.5–5.0)
Alkaline Phosphatase: 70 U/L (ref 38–126)
Anion gap: 18 — ABNORMAL HIGH (ref 5–15)
BUN: 47 mg/dL — ABNORMAL HIGH (ref 6–20)
CO2: 18 mmol/L — ABNORMAL LOW (ref 22–32)
Calcium: 7.6 mg/dL — ABNORMAL LOW (ref 8.9–10.3)
Chloride: 101 mmol/L (ref 98–111)
Creatinine, Ser: 11.43 mg/dL — ABNORMAL HIGH (ref 0.61–1.24)
GFR, Estimated: 5 mL/min — ABNORMAL LOW (ref >60)
Glucose, Bld: 72 mg/dL (ref 70–99)
Potassium: 5.1 mmol/L (ref 3.5–5.1)
Sodium: 137 mmol/L (ref 135–145)
Total Bilirubin: 1.5 mg/dL — ABNORMAL HIGH (ref 0.0–1.2)
Total Protein: 7.2 g/dL (ref 6.5–8.1)

## 2023-09-08 MED ORDER — HEPARIN SODIUM (PORCINE) 1000 UNIT/ML IJ SOLN
INTRAMUSCULAR | Status: AC
  Filled 2023-09-08: qty 3

## 2023-09-08 MED ORDER — CHLORHEXIDINE GLUCONATE CLOTH 2 % EX PADS
6.0000 | MEDICATED_PAD | Freq: Every day | CUTANEOUS | Status: DC
Start: 2023-09-08 — End: 2023-09-08

## 2023-09-08 NOTE — Progress Notes (Signed)
   09/08/23 1334  Vitals  Temp 97.8 F (36.6 C)  Pulse Rate 87  Resp 13  BP (!) 161/94  SpO2 100 %  Weight 76.8 kg  Type of Weight Post-Dialysis  Oxygen Therapy  Patient Activity (if Appropriate) In bed  Pulse Oximetry Type Continuous  Oximetry Probe Site Changed No  Post Treatment  Dialyzer Clearance Clear  Liters Processed 84  Fluid Removed (mL) 2500 mL  Tolerated HD Treatment Yes  AVG/AVF Arterial Site Held (minutes) 5 minutes  AVG/AVF Venous Site Held (minutes) 5 minutes   Received patient in bed to unit.  Alert and oriented.  Informed consent signed and in chart.   TX duration: Three hours and thirty minutes.   Patient tolerated well.  Transported back to the room  Alert, without acute distress.  Hand-off given to patient's nurse.   Access used: Right upper arm fistula Access issues: None

## 2023-09-08 NOTE — ED Notes (Signed)
 Patient is resting comfortably.

## 2023-09-08 NOTE — ED Provider Notes (Signed)
 After dialysis patient came down and according to the nurse reportedly left and so I was unable to evaluate the patient to see if there were any changes in his condition.   Denese Finn, PA-C 09/08/23 1419    Ninetta Basket, MD 09/09/23 2038

## 2023-09-08 NOTE — ED Notes (Signed)
 Unable to obtain v/s and discharge instrctions. Pt eloped when arrived on ED floor.

## 2023-09-08 NOTE — ED Provider Notes (Signed)
 [place] EMERGENCY DEPARTMENT AT [facility] Provider Note   CSN: [id number] Arrival date & time: 09/08/23  0630     History  No chief complaint on file.   Paul [name] is a 58 y.o. male.  Pt is here for dialysis.  Pt denies any complaints.  Pt denies any shortness of breath.No swelling   The history is provided by the patient. No language interpreter was used.       Home Medications Prior to Admission medications   Medication Sig Start Date End Date Taking? Authorizing Provider  carvedilol  (COREG ) 6.25 MG tablet Take 1 tablet (6.25 mg total) by mouth 2 (two) times daily with a meal. Patient taking differently: Take 6.25 mg by mouth daily. 07/29/23 09/05/23  Tawkaliyar, Roya, DO  isosorbide  mononitrate (IMDUR ) 30 MG 24 hr tablet Take 1 tablet (30 mg total) by mouth daily. 07/29/23   Tawkaliyar, Roya, DO  OLANZapine  zydis (ZYPREXA ) 15 MG disintegrating tablet Take 15 mg by mouth at bedtime. Patient not taking: Reported on 09/03/2023    [provider]  rosuvastatin  (CRESTOR ) 5 MG tablet Take 1 tablet (5 mg total) by mouth daily. 03/24/23     sucroferric oxyhydroxide (VELPHORO ) 500 MG chewable tablet Chew 3 tablets (1,500 mg total) by mouth 3 (three) times daily with meals. Patient taking differently: Chew 500 mg by mouth in the morning and at bedtime. 07/29/23   Tawkaliyar, Roya, DO      Allergies    Penicillin g    Review of Systems   Review of Systems  All other systems reviewed and are negative.   Physical Exam Updated Vital Signs BP (!) 199/106 (BP Location: Left Arm)   Pulse 93   Temp (!) 97.1 F (36.2 C)   Resp 17   SpO2 100%  Physical Exam Vitals and nursing note reviewed.  Constitutional:      Appearance: He is well-developed.  HENT:     Head: Normocephalic.     Right Ear: Tympanic membrane normal.     Left Ear: Tympanic membrane normal.     Nose: Nose normal.     Mouth/Throat:     Mouth: Mucous membranes are moist.   Cardiovascular:     Rate and Rhythm: Normal rate.  Pulmonary:     Effort: Pulmonary effort is normal.  Abdominal:     General: There is no distension.  Musculoskeletal:        General: Normal range of motion.     Cervical back: Normal range of motion.  Skin:    General: Skin is warm.  Neurological:     General: No focal deficit present.     Mental Status: He is alert and oriented to person, place, and time.     ED Results / Procedures / Treatments   Labs (all labs ordered are listed, but only abnormal results are displayed) Labs Reviewed  CBC WITH DIFFERENTIAL/PLATELET  COMPREHENSIVE METABOLIC PANEL WITH GFR    EKG None  Radiology No results found.  Procedures Procedures    Medications Ordered in ED Medications - No data to display  ED Course/ Medical Decision Making/ A&P                                 Medical Decision Making Pt here for dialysis   Amount and/or Complexity of Data Reviewed Labs: ordered.    Details: Lab ordered and  reviewed Discussion of management  or test interpretation with external provider(s): I discussed the patient with Dr. Kraska who will set patient up for dialysis.  Risk Risk Details: Nephrology consulted            Final Clinical Impression(s) / ED Diagnoses Final diagnoses:  Acute renal failure, unspecified acute renal failure type [facility])    Rx / DC Orders ED Discharge Orders     None         Sandi Crosby, PA-C 09/08/23 0981    Arvilla Birmingham, MD 09/08/23 [phone]

## 2023-09-08 NOTE — Progress Notes (Signed)
 We were asked to see Paul [name] for dialysis needs. Patient was discharged from his outpatient hemodialysis center due to behavior issues. Plan for ED HD today. Patient will then be returned back to the ED after today's treatment for reassessment.  Last OP HD unit info --> just released 07/2023 from [facility]  B400  76.5kg  2K bath    RUA AVF  Heparin  2400 units  Clinical issues other than volume: 1) Anemia of esrd:  - pt dose not have a nephrologist or HD unit so does not have access to ESA or IV iron  other than when here for his hospital HD sessions - recent Hb in mid April were in the 7- 9 range - of 5 orders for ESA in march- April, only 2 were given - darbe 40 mcg 08/18/23 - darbe 40 mcg 09/03/23 - tsat 31% and ferritin 1175 on 07/21/23 -goal Hgb 9-11 range  Paul Maxwell, NP [facility]

## 2023-09-08 NOTE — ED Triage Notes (Signed)
 Pt states that he is here for dialysis, last treatment was on Friday.

## 2023-09-08 NOTE — Progress Notes (Signed)
 Met with pt at bedside while receiving HD. Pt was advised that navigator never received a return call from [facility] regarding making an appt for pt. Pt states he plans to try to go to [facility] tomorrow during their walk-in hrs and request assistance. Navigator provided pt info from [employer] regarding walk-in hrs Mon-Fri. Pt attempting to establish mental health provider/follow-up so pt can re-considered for out-pt HD clinic placement.   Lauraine Polite Renal Navigator [phone]

## 2023-09-09 ENCOUNTER — Other Ambulatory Visit: Payer: Self-pay

## 2023-09-09 NOTE — Patient Outreach (Signed)
 Complex Care Management   Visit Note  09/09/2023  Name:  Paul [name] MRN: [id number] DOB: 1965/08/08  Situation: Referral received for Complex Care Management related to Heart Failure and ESRD I obtained verbal consent from Patient.  Visit completed with [name]  on the phone  Background:   Past Medical History:  Diagnosis Date   Bipolar disorder [facility])    Central retinal artery occlusion    with macular infarction of the right eye. status post posterior vitrectomy and photocoagulation with insertion of a shunt in 2006.   CHF (congestive heart failure) (HCC)    chronic mixed syst/diast. 02/2009 echo: Systolic function was mildly reduced. The estimated ejection fraction was in 45%, global HK, Grade I Diast dysfxn.   Chronic kidney disease    ESRD   Heart failure    Obesity    Osteoarthritis    s/p right hip replacement in 2001   Schizophrenia (HCC)    Severe uncontrolled hypertension     Assessment: Patient Reported Symptoms:  Cognitive        Neurological Neurological Review of Symptoms: Not assessed    HEENT HEENT Symptoms Reported: Not assessed      Cardiovascular Cardiovascular Symptoms Reported: Not assessed    Respiratory Respiratory Symptoms Reported: Dry cough Additional Respiratory Details: Dry cough started a few weeks ago. He has not been trying any treatment methods, does not find it bothersome    Endocrine Patient reports the following symptoms related to hypoglycemia or hyperglycemia : Not assessed    Gastrointestinal Gastrointestinal Symptoms Reported: No symptoms reported (Patient reported nausea at previous visit. Denies today.)   Nutrition Risk Screen (CP): No indicators present  Genitourinary Genitourinary Symptoms Reported: No symptoms reported Genitourinary Conditions: End-stage renal disease Genitourinary Management Strategies: Hemodialysis, Fluid modification Hemodialysis Schedule: M, W, F Hemodialysis Last Treatment:  09/08/23 Genitourinary Comment: Patient continues to get dialysis at the ED. Confirmed that patient plans to attend dialysis tomorrow. He is working to get set up with a mental health provider so that he can be considered for outpatient dialysis.  Integumentary Integumentary Symptoms Reported: Not assessed    Musculoskeletal Musculoskelatal Symptoms Reviewed: Not assessed        Psychosocial Psychosocial Symptoms Reported: Not assessed            06/07/2021    2:06 PM  Depression screen PHQ 2/9  Decreased Interest 0  Down, Depressed, Hopeless 0  PHQ - 2 Score 0    There were no vitals filed for this visit.  Medications Reviewed Today     Reviewed by Valaria Garland, RN (Registered Nurse) on 09/09/23 at 1406  Med List Status: <None>   Medication Order Taking? Sig Documenting Provider Last Dose Status Informant  carvedilol  (COREG ) 6.25 MG tablet [id number]  Take 1 tablet (6.25 mg total) by mouth 2 (two) times daily with a meal.  Patient taking differently: Take 6.25 mg by mouth daily.   Lanney Pitts, DO  Expired 09/08/23 2359 Self, Pharmacy Records           Med Note Nanine Babcock, [facility] D   Mon Sep 08, 2023  7:50 AM)    isosorbide  mononitrate (IMDUR ) 30 MG 24 hr tablet [id number]  Take 1 tablet (30 mg total) by mouth daily. Lanney Pitts, DO  Active Self, Pharmacy Records  OLANZapine  zydis (ZYPREXA ) 15 MG disintegrating tablet [id number]  Take 15 mg by mouth at bedtime.  Patient not taking: Reported on 09/03/2023   [provider]  Active Self,  Pharmacy Records  rosuvastatin  (CRESTOR ) 5 MG tablet [id number]  Take 1 tablet (5 mg total) by mouth daily.   Active Self, Pharmacy Records           Med Note (CRUTHIS, CHLOE C   Wed Sep 03, 2023  7:18 AM)    sucroferric oxyhydroxide (VELPHORO ) 500 MG chewable tablet [id number]  Chew 3 tablets (1,500 mg total) by mouth 3 (three) times daily with meals.  Patient taking differently: Chew 500 mg by mouth in the morning and at  bedtime.   Lanney Pitts, DO  Active Self, Pharmacy Records           Med Note (CRUTHIS, CHLOE C   Wed Sep 03, 2023  7:39 AM) Pt stated he is only taking 500 mg BID.   Med List Note Alline Ivans, CPhT 09/05/23 1610): Dialysis MWF [facility], d/c from local HD clinic due to behaviors. Pt's chart indicates that pt is not currently taking prescribed medications as prescribed by psychiatric staff.             Recommendation:   Continue to attend all dialysis sessions Begin checking your weight daily Speak with Renal Navigator during dialysis for further assistance in locating a mental health provider  Follow Up Plan:   Telephone follow up appointment date/time:  09/23/23 at 2 PM  Theodora Fish, RN MSN [place]  [facility] RN Care Manager Direct Dial : [phone]  Fax: [phone]

## 2023-09-09 NOTE — Patient Instructions (Signed)
 Visit Information  Thank you for taking time to visit with me today. Please don't hesitate to contact me if I can be of assistance to you before our next scheduled appointment.  Your next care management appointment is by telephone on 09/23/23 at 2 PM  Please call the care guide team at [phone] if you need to cancel, schedule, or reschedule an appointment.   Please call the Suicide and Crisis Lifeline: 988 call 1-800-273-TALK (toll free, 24 hour hotline) if you are experiencing a Mental Health or Behavioral Health Crisis or need someone to talk to.  Theodora Fish, RN MSN [place]  [facility] RN Care Manager Direct Dial : [phone]  Fax: [phone]

## 2023-09-10 ENCOUNTER — Emergency Department (HOSPITAL_COMMUNITY)

## 2023-09-10 ENCOUNTER — Other Ambulatory Visit: Payer: Self-pay

## 2023-09-10 ENCOUNTER — Emergency Department (HOSPITAL_COMMUNITY)
Admission: EM | Admit: 2023-09-10 | Discharge: 2023-09-10 | Disposition: A | Discharge status: Home / Self Care | Attending: Emergency Medicine | Admitting: Emergency Medicine

## 2023-09-10 ENCOUNTER — Encounter (HOSPITAL_COMMUNITY): Payer: Self-pay | Admitting: Emergency Medicine

## 2023-09-10 DIAGNOSIS — I120 Hypertensive chronic kidney disease with stage 5 chronic kidney disease or end stage renal disease: Secondary | ICD-10-CM | POA: Diagnosis not present

## 2023-09-10 DIAGNOSIS — R059 Cough, unspecified: Secondary | ICD-10-CM | POA: Insufficient documentation

## 2023-09-10 DIAGNOSIS — D631 Anemia in chronic kidney disease: Secondary | ICD-10-CM | POA: Diagnosis not present

## 2023-09-10 DIAGNOSIS — Z992 Dependence on renal dialysis: Secondary | ICD-10-CM | POA: Insufficient documentation

## 2023-09-10 DIAGNOSIS — N186 End stage renal disease: Secondary | ICD-10-CM | POA: Insufficient documentation

## 2023-09-10 DIAGNOSIS — Z79899 Other long term (current) drug therapy: Secondary | ICD-10-CM | POA: Diagnosis not present

## 2023-09-10 LAB — COMPREHENSIVE METABOLIC PANEL WITH GFR
ALT: 18 U/L (ref 0–44)
AST: 13 U/L — ABNORMAL LOW (ref 15–41)
Albumin: 3.4 g/dL — ABNORMAL LOW (ref 3.5–5.0)
Alkaline Phosphatase: 91 U/L (ref 38–126)
Anion gap: 17 — ABNORMAL HIGH (ref 5–15)
BUN: 55 mg/dL — ABNORMAL HIGH (ref 6–20)
CO2: 25 mmol/L (ref 22–32)
Calcium: 8 mg/dL — ABNORMAL LOW (ref 8.9–10.3)
Chloride: 98 mmol/L (ref 98–111)
Creatinine, Ser: 10.1 mg/dL — ABNORMAL HIGH (ref 0.61–1.24)
GFR, Estimated: 5 mL/min — ABNORMAL LOW (ref >60)
Glucose, Bld: 71 mg/dL (ref 70–99)
Potassium: 4.1 mmol/L (ref 3.5–5.1)
Sodium: 140 mmol/L (ref 135–145)
Total Bilirubin: 0.7 mg/dL (ref 0.0–1.2)
Total Protein: 7.5 g/dL (ref 6.5–8.1)

## 2023-09-10 LAB — CBC WITH DIFFERENTIAL/PLATELET
Abs Immature Granulocytes: 1.04 10*3/uL — ABNORMAL HIGH (ref 0.00–0.07)
Basophils Absolute: 0.1 10*3/uL (ref 0.0–0.1)
Basophils Relative: 1 %
Eosinophils Absolute: 0.2 10*3/uL (ref 0.0–0.5)
Eosinophils Relative: 2 %
HCT: 25.5 % — ABNORMAL LOW (ref 39.0–52.0)
Hemoglobin: 8.4 g/dL — ABNORMAL LOW (ref 13.0–17.0)
Immature Granulocytes: 9 %
Lymphocytes Relative: 18 %
Lymphs Abs: 2.1 10*3/uL (ref 0.7–4.0)
MCH: 34.6 pg — ABNORMAL HIGH (ref 26.0–34.0)
MCHC: 32.9 g/dL (ref 30.0–36.0)
MCV: 104.9 fL — ABNORMAL HIGH (ref 80.0–100.0)
Monocytes Absolute: 1 10*3/uL (ref 0.1–1.0)
Monocytes Relative: 9 %
Neutro Abs: 7.1 10*3/uL (ref 1.7–7.7)
Neutrophils Relative %: 61 %
Platelets: 366 10*3/uL (ref 150–400)
RBC: 2.43 MIL/uL — ABNORMAL LOW (ref 4.22–5.81)
RDW: 14.1 % (ref 11.5–15.5)
Smear Review: NORMAL
WBC: 11.5 10*3/uL — ABNORMAL HIGH (ref 4.0–10.5)
nRBC: 0 % (ref 0.0–0.2)

## 2023-09-10 MED ORDER — CHLORHEXIDINE GLUCONATE CLOTH 2 % EX PADS: 6.0000 | MEDICATED_PAD | Freq: Every day | CUTANEOUS | Status: DC

## 2023-09-10 MED ORDER — DARBEPOETIN ALFA 40 MCG/0.4ML IJ SOSY
40.0000 ug | PREFILLED_SYRINGE | Freq: Once | INTRAMUSCULAR | Status: AC
  Administered 2023-09-10: 40 ug via SUBCUTANEOUS
  Filled 2023-09-10 (×2): qty 0.4

## 2023-09-10 NOTE — ED Triage Notes (Signed)
 Patient reports needing dialysis, last treatment Monday.  Patient also endorses cough.

## 2023-09-10 NOTE — Progress Notes (Signed)
 We were asked to see Paul [name] for dialysis needs. Patient was discharged from his outpatient hemodialysis center due to behavior issues. Plan for ED HD today. Patient will then be returned back to the ED after today's treatment for reassessment.   Last OP HD unit info --> just released 07/2023 from [facility]  B400  76.5kg  2K bath    RUA AVF  Heparin  2400 units   Clinical issues other than volume: 1) Anemia of esrd:  - Pt dose not have a nephrologist or HD unit so does not have access to ESA or IV iron  other than when here for his hospital HD sessions - recent Hb in mid April were in the 7- 9 range - of 5 orders for ESA in march- April, only 2 were given - darbe 40 mcg 08/18/23 - darbe 40 mcg 09/03/23 - tsat 31% and ferritin 1175 on 07/21/23 -goal Hgb 9-11 range  Plan -Will give Aranesp  40mcg IV X 1 with HD today-medication has been ordered   Jadene Maxwell, NP [employer]

## 2023-09-10 NOTE — Discharge Instructions (Addendum)
Your chest x-ray did not show any signs of pneumonia.

## 2023-09-10 NOTE — ED Provider Notes (Signed)
 [place] EMERGENCY DEPARTMENT AT [facility] Provider Note   CSN: [id number] Arrival date & time: 09/10/23  4098     History  Chief Complaint  Patient presents with   Cough   Needs Dialysis    Paul [name] is a 58 y.o. male.  The history is provided by the patient and medical records. No language interpreter was used.  Cough    58 year old male with history of end-stage renal disease currently on dialysis, hypertension, bipolar, schizophrenia presenting requesting for dialysis.  Patient normally comes to the ER for dialysis.  Last dialysis session was 2 days ago.  No significant complaint except for nonproductive cough.  Denies any fever or shortness of breath.  Home Medications Prior to Admission medications   Medication Sig Start Date End Date Taking? Authorizing Provider  carvedilol  (COREG ) 6.25 MG tablet Take 1 tablet (6.25 mg total) by mouth 2 (two) times daily with a meal. Patient taking differently: Take 6.25 mg by mouth daily. 07/29/23 09/08/23  Tawkaliyar, Roya, DO  isosorbide  mononitrate (IMDUR ) 30 MG 24 hr tablet Take 1 tablet (30 mg total) by mouth daily. 07/29/23   Tawkaliyar, Roya, DO  OLANZapine  zydis (ZYPREXA ) 15 MG disintegrating tablet Take 15 mg by mouth at bedtime. Patient not taking: Reported on 09/03/2023    [provider]  rosuvastatin  (CRESTOR ) 5 MG tablet Take 1 tablet (5 mg total) by mouth daily. 03/24/23     sucroferric oxyhydroxide (VELPHORO ) 500 MG chewable tablet Chew 3 tablets (1,500 mg total) by mouth 3 (three) times daily with meals. Patient taking differently: Chew 500 mg by mouth in the morning and at bedtime. 07/29/23   Tawkaliyar, Roya, DO      Allergies    Penicillin g    Review of Systems   Review of Systems  Respiratory:  Positive for cough.   All other systems reviewed and are negative.   Physical Exam Updated Vital Signs BP (!) 187/95 (BP Location: Left Arm)   Pulse 80   Temp (!) 97.5 F (36.4 C)   Resp  16   Wt 77 kg   SpO2 97%   BMI 30.07 kg/m  Physical Exam Constitutional:      General: He is not in acute distress.    Appearance: He is well-developed.  HENT:     Head: Atraumatic.  Eyes:     Conjunctiva/sclera: Conjunctivae normal.  Cardiovascular:     Rate and Rhythm: Normal rate and regular rhythm.     Pulses: Normal pulses.     Heart sounds: Normal heart sounds.  Pulmonary:     Effort: Pulmonary effort is normal.     Breath sounds: Normal breath sounds. No wheezing or rales.  Abdominal:     Palpations: Abdomen is soft.  Musculoskeletal:     Cervical back: Normal range of motion and neck supple.  Skin:    Findings: No rash.     Comments: AV fistula to right upper extremity with palpable thrill and bruit  Neurological:     Mental Status: He is alert.     ED Results / Procedures / Treatments   Labs (all labs ordered are listed, but only abnormal results are displayed) Labs Reviewed  CBC WITH DIFFERENTIAL/PLATELET - Abnormal; Notable for the following components:      Result Value   WBC 11.5 (*)    RBC 2.43 (*)    Hemoglobin 8.4 (*)    HCT 25.5 (*)    MCV 104.9 (*)  MCH 34.6 (*)    Abs Immature Granulocytes 1.04 (*)    All other components within normal limits  COMPREHENSIVE METABOLIC PANEL WITH GFR - Abnormal; Notable for the following components:   BUN 55 (*)    Creatinine, Ser 10.10 (*)    Calcium  8.0 (*)    Albumin 3.4 (*)    AST 13 (*)    GFR, Estimated 5 (*)    Anion gap 17 (*)    All other components within normal limits    EKG None  Radiology No results found.  Procedures Procedures    Medications Ordered in ED Medications - No data to display  ED Course/ Medical Decision Making/ A&P                                 Medical Decision Making Amount and/or Complexity of Data Reviewed Labs: ordered. Radiology: ordered.   BP (!) 187/95 (BP Location: Left Arm)   Pulse 80   Temp (!) 97.5 F (36.4 C)   Resp 16   Wt 77 kg   SpO2  97%   BMI 30.07 kg/m   67:16 AM  58 year old male with history of end-stage renal disease currently on dialysis, hypertension, bipolar, schizophrenia presenting requesting for dialysis.  Patient normally comes to the ER for dialysis.  Last dialysis session was 2 days ago.  No significant complaint except for nonproductive cough.  Denies any fever or shortness of breath.  Exam overall reassuring patient resting comfortably appears to be in no acute discomfort.  Lungs are clear to auscultation  -Labs ordered, independently viewed and interpreted by me.  Labs remarkable for hemoglobin of 8.4, improved from prior.  Impaired renal function, similar to prior -The patient was maintained on a cardiac monitor.  I personally viewed and interpreted the cardiac monitored which showed an underlying rhythm of: Normal sinus rhythm -Imaging independently viewed and interpreted by me and I agree with radiologist's interpretation.  Result remarkable for chest x-ray without concerning finding -This patient presents to the ED for concern of needing dialysis, this involves an extensive number of treatment options, and is a complaint that carries with it a high risk of complications and morbidity.  The differential diagnosis includes end-stage renal disease -Co morbidities that complicate the patient evaluation includes end-stage renal disease -Treatment includes dialysis -Reevaluation of the patient after these medicines showed that the patient improved -PCP office notes or outside notes reviewed -Discussion with specialist dialysis team Dr. Higinio Love who agrees to help dialyze patient -Escalation to admission/observation considered: patient will be d/c once he received his dialysis session.         Final Clinical Impression(s) / ED Diagnoses Final diagnoses:  Dialysis patient [facility])    Rx / DC Orders ED Discharge Orders     None         Debbra Fairy, PA-C 09/10/23 1513    Deatra Face,  MD 09/15/23 1546

## 2023-09-11 ENCOUNTER — Other Ambulatory Visit (HOSPITAL_COMMUNITY): Payer: Self-pay

## 2023-09-11 ENCOUNTER — Telehealth: Payer: Self-pay

## 2023-09-11 NOTE — Progress Notes (Signed)
 Late Note entry- Sep 11, 2023  Unable to meet or talk with pt yesterday. Spoke to pt via phone this morning. Pt advised navigator earlier this week that pt is unable to be seen at [facility] due to [employer]. Pt attempting to make an appt with another provider but was advised that referral is needed from PCP. Pt is trying to contact PCP office regarding this need. Navigator sent secure chat to PCP this morning to see if office staff can please contact pt to assist with this referral/matter.   Lauraine Polite Renal Navigator [phone]

## 2023-09-11 NOTE — Telephone Encounter (Signed)
 Copied from CRM [id number]. Topic: Referral - Request for Referral >> Sep 11, 2023 10:22 AM Rennis Case wrote: Did the patient discuss referral with their provider in the last year? No  Appointment offered? Yes, patient at seen ED for dialysis, attempted to schedule a hospital f/u to be seen and discuss referral. No availability within 14 days, patient requesting to be squeezed in if needed for referral.   Type of order/referral and detailed reason for visit: Psychiatrist. Needing psych evaluation to continue to be seen at dialysis clinic, dialysis clinic will not accept patient until psych evaluation is completed.   Preference of office, provider, location: [facility]  If referral order, have you been seen by this specialty before? No  Can we respond through MyChart? No, requesting phone call, [phone]

## 2023-09-12 ENCOUNTER — Emergency Department (HOSPITAL_COMMUNITY)
Admission: EM | Admit: 2023-09-12 | Discharge: 2023-09-12 | Discharge status: Against Medical Advice | Attending: Emergency Medicine | Admitting: Emergency Medicine

## 2023-09-12 ENCOUNTER — Other Ambulatory Visit: Payer: Self-pay

## 2023-09-12 DIAGNOSIS — N186 End stage renal disease: Secondary | ICD-10-CM | POA: Insufficient documentation

## 2023-09-12 DIAGNOSIS — Y732 Prosthetic and other implants, materials and accessory gastroenterology and urology devices associated with adverse incidents: Secondary | ICD-10-CM | POA: Diagnosis not present

## 2023-09-12 DIAGNOSIS — Z992 Dependence on renal dialysis: Secondary | ICD-10-CM | POA: Diagnosis not present

## 2023-09-12 DIAGNOSIS — T82598A Other mechanical complication of other cardiac and vascular devices and implants, initial encounter: Secondary | ICD-10-CM | POA: Insufficient documentation

## 2023-09-12 DIAGNOSIS — I120 Hypertensive chronic kidney disease with stage 5 chronic kidney disease or end stage renal disease: Secondary | ICD-10-CM | POA: Diagnosis not present

## 2023-09-12 LAB — CBC WITH DIFFERENTIAL/PLATELET
Abs Immature Granulocytes: 0.65 10*3/uL — ABNORMAL HIGH (ref 0.00–0.07)
Basophils Absolute: 0.1 10*3/uL (ref 0.0–0.1)
Basophils Relative: 0 %
Eosinophils Absolute: 0.2 10*3/uL (ref 0.0–0.5)
Eosinophils Relative: 1 %
HCT: 22 % — ABNORMAL LOW (ref 39.0–52.0)
Hemoglobin: 7.3 g/dL — ABNORMAL LOW (ref 13.0–17.0)
Immature Granulocytes: 6 %
Lymphocytes Relative: 14 %
Lymphs Abs: 1.6 10*3/uL (ref 0.7–4.0)
MCH: 34.1 pg — ABNORMAL HIGH (ref 26.0–34.0)
MCHC: 33.2 g/dL (ref 30.0–36.0)
MCV: 102.8 fL — ABNORMAL HIGH (ref 80.0–100.0)
Monocytes Absolute: 1 10*3/uL (ref 0.1–1.0)
Monocytes Relative: 9 %
Neutro Abs: 8.2 10*3/uL — ABNORMAL HIGH (ref 1.7–7.7)
Neutrophils Relative %: 70 %
Platelets: 292 10*3/uL (ref 150–400)
RBC: 2.14 MIL/uL — ABNORMAL LOW (ref 4.22–5.81)
RDW: 13.9 % (ref 11.5–15.5)
Smear Review: NORMAL
WBC: 11.7 10*3/uL — ABNORMAL HIGH (ref 4.0–10.5)
nRBC: 0.3 % — ABNORMAL HIGH (ref 0.0–0.2)

## 2023-09-12 LAB — COMPREHENSIVE METABOLIC PANEL WITH GFR
ALT: 16 U/L (ref 0–44)
AST: 13 U/L — ABNORMAL LOW (ref 15–41)
Albumin: 3.1 g/dL — ABNORMAL LOW (ref 3.5–5.0)
Alkaline Phosphatase: 92 U/L (ref 38–126)
Anion gap: 18 — ABNORMAL HIGH (ref 5–15)
BUN: 51 mg/dL — ABNORMAL HIGH (ref 6–20)
CO2: 22 mmol/L (ref 22–32)
Calcium: 7.6 mg/dL — ABNORMAL LOW (ref 8.9–10.3)
Chloride: 99 mmol/L (ref 98–111)
Creatinine, Ser: 11.09 mg/dL — ABNORMAL HIGH (ref 0.61–1.24)
GFR, Estimated: 5 mL/min — ABNORMAL LOW (ref >60)
Glucose, Bld: 88 mg/dL (ref 70–99)
Potassium: 4.9 mmol/L (ref 3.5–5.1)
Sodium: 139 mmol/L (ref 135–145)
Total Bilirubin: 0.7 mg/dL (ref 0.0–1.2)
Total Protein: 6.9 g/dL (ref 6.5–8.1)

## 2023-09-12 MED ORDER — ALTEPLASE 2 MG IJ SOLR: 2.0000 mg | Freq: Once | INTRAMUSCULAR | Status: DC | PRN

## 2023-09-12 MED ORDER — LIDOCAINE HCL (PF) 1 % IJ SOLN: 5.0000 mL | INTRAMUSCULAR | Status: DC | PRN

## 2023-09-12 MED ORDER — HEPARIN SODIUM (PORCINE) 1000 UNIT/ML DIALYSIS: 1000.0000 [IU] | INTRAMUSCULAR | Status: DC | PRN

## 2023-09-12 MED ORDER — CHLORHEXIDINE GLUCONATE CLOTH 2 % EX PADS: 6.0000 | MEDICATED_PAD | Freq: Every day | CUTANEOUS | Status: DC

## 2023-09-12 MED ORDER — PENTAFLUOROPROP-TETRAFLUOROETH EX AERO: 1.0000 | INHALATION_SPRAY | CUTANEOUS | Status: DC | PRN

## 2023-09-12 MED ORDER — LIDOCAINE-PRILOCAINE 2.5-2.5 % EX CREA: 1.0000 | TOPICAL_CREAM | CUTANEOUS | Status: DC | PRN

## 2023-09-12 NOTE — Progress Notes (Signed)
 We were asked to see Paul [name] for dialysis needs. Patient was discharged from his outpatient hemodialysis center due to behavior issues. Plan for ED HD today. Patient will then be returned back to the ED after today's treatment for reassessment. Last HD was 09/10/23. Noted HD machine clotted at last HD and patient refused for lines to be reset. Unclear how much UF was removed.   Last OP HD unit info --> just released 07/2023 from [facility]  B400  76.5kg  2K bath    RUA AVF  Heparin  2400 units   Clinical issues other than volume: 1) Anemia of esrd:  - Pt dose not have a nephrologist or HD unit so does not have access to ESA or IV iron  other than when here for his hospital HD sessions - recent Hb in mid April were in the 7- 9 range - of 5 orders for ESA in march- April, only 2 were given - darbe 40 mcg 08/18/23 - darbe 40 mcg 09/03/23 -darbe 09/10/23 - tsat 31% and ferritin 1175 on 07/21/23 -goal Hgb 9-11 range   Plan -Last Aranesp  40mcg given 5/7 -Ordered pre-HD labs RFP and CBC to be done -Will add heparin  mid-run bolus today since machine clotted at last HD.  Jadene Maxwell, NP [facility]

## 2023-09-12 NOTE — ED Triage Notes (Signed)
 Pt. Here for dialysis . Completed full treatment on Wednesday

## 2023-09-12 NOTE — ED Notes (Signed)
 Spoke with Dialysis, they will draw the blood up there when he arrives as we have had two unsuccessful attempts.

## 2023-09-12 NOTE — Progress Notes (Signed)
 Received patient in bed to unit. Bay 7 Alert and oriented. A7O x4 Informed consent signed and in chart. Yes  TX duration: 3.5 hours  Patient tolerated well.  Transported back to the ER via stretcher Alert, without acute distress.  Hand-off given to patient's nurse.  RN in ER  Access used: Left  Fistula Access issues: None  Total UF removed: 2500 , 84 BVP Medication(s) given: Heparin  during initiation and  mid run per MD order Post HD weight: 88.1 KG Post HD VS: 148/81, 99 MAP, 14 Resp, 94, 100% RA   Dakoda Laventure S Nancyjo Givhan RN , DNP [facility]

## 2023-09-12 NOTE — ED Provider Notes (Signed)
 [facility] Provider Note   CSN: [id number] Arrival date & time: 09/12/23  0930     History Chief Complaint  Patient presents with   Vascular Access Problem    Paul [name] is a 58 y.o. male.  Patient with a history of ESRD on hemodialysis presents emergency department requesting dialysis.  Patient states he has a Monday Wednesday Friday dialysis patient with no outpatient clinic set up at this time.  Was seen 2 days ago for hospital dialysis.  Denies any specific concerns at this time such as headache, leg swelling, shortness of breath, fevers, chills or bodyaches.  He denies any new symptoms that are atypical for him.  HPI     Home Medications Prior to Admission medications   Medication Sig Start Date End Date Taking? Authorizing Provider  carvedilol  (COREG ) 6.25 MG tablet Take 1 tablet (6.25 mg total) by mouth 2 (two) times daily with a meal. Patient taking differently: Take 6.25 mg by mouth daily. 07/29/23 09/08/23  Tawkaliyar, Roya, DO  isosorbide  mononitrate (IMDUR ) 30 MG 24 hr tablet Take 1 tablet (30 mg total) by mouth daily. 07/29/23   Tawkaliyar, Roya, DO  OLANZapine  zydis (ZYPREXA ) 15 MG disintegrating tablet Take 15 mg by mouth at bedtime. Patient not taking: Reported on 09/03/2023    [provider]  rosuvastatin  (CRESTOR ) 5 MG tablet Take 1 tablet (5 mg total) by mouth daily. 03/24/23     sucroferric oxyhydroxide (VELPHORO ) 500 MG chewable tablet Chew 3 tablets (1,500 mg total) by mouth 3 (three) times daily with meals. Patient taking differently: Chew 500 mg by mouth in the morning and at bedtime. 07/29/23   Tawkaliyar, Roya, DO      Allergies    Penicillin g    Review of Systems   Review of Systems  Constitutional:  Negative for activity change and fatigue.  All other systems reviewed and are negative.   Physical Exam Updated Vital Signs BP (!) 145/92   Pulse 89   Temp 97.9 F (36.6 C)   Resp 17    SpO2 97%  Physical Exam Vitals and nursing note reviewed.  Constitutional:      General: He is not in acute distress.    Appearance: He is well-developed.  HENT:     Head: Normocephalic and atraumatic.  Eyes:     Conjunctiva/sclera: Conjunctivae normal.  Cardiovascular:     Rate and Rhythm: Normal rate and regular rhythm.     Pulses: Normal pulses.     Heart sounds: Normal heart sounds. No murmur heard. Pulmonary:     Effort: Pulmonary effort is normal. No respiratory distress.     Breath sounds: Normal breath sounds. No wheezing or rales.  Abdominal:     Palpations: Abdomen is soft.     Tenderness: There is no abdominal tenderness.  Musculoskeletal:        General: No swelling.     Cervical back: Normal range of motion and neck supple.     Right lower leg: No edema.     Left lower leg: No edema.  Skin:    General: Skin is warm and dry.     Capillary Refill: Capillary refill takes less than 2 seconds.     Findings: No rash.     Comments: AV fistula to right upper extremity with palpable thrill and bruit  Neurological:     Mental Status: He is alert.  Psychiatric:        Mood  and Affect: Mood normal.     ED Results / Procedures / Treatments   Labs (all labs ordered are listed, but only abnormal results are displayed) Labs Reviewed  CBC WITH DIFFERENTIAL/PLATELET  COMPREHENSIVE METABOLIC PANEL WITH GFR    EKG None  Radiology No results found.  Procedures Procedures    Medications Ordered in ED Medications  Chlorhexidine  Gluconate Cloth 2 % PADS 6 each (has no administration in time range)  pentafluoroprop-tetrafluoroeth (GEBAUERS) aerosol 1 Application (has no administration in time range)  lidocaine  (PF) (XYLOCAINE ) 1 % injection 5 mL (has no administration in time range)  lidocaine -prilocaine  (EMLA ) cream 1 Application (has no administration in time range)  heparin  injection 1,000 Units (has no administration in time range)  alteplase  (CATHFLO ACTIVASE )  injection 2 mg (has no administration in time range)    ED Course/ Medical Decision Making/ A&P                                 Medical Decision Making Amount and/or Complexity of Data Reviewed Labs: ordered.   This patient presents to the ED for concern of needs dialysis.  Differential diagnosis includes ESRD on hemodialysis, hyperkalemia, dehydration, sepsis   Problem List / ED Course:  Patient presents to the emergency department today for concerns of dialysis.  Patient is an ESRD patient on hemodialysis Monday, Wednesday, and Friday weekly.  Reports last dialysis session was on Wednesday.  He comes into the hospital for do his dialysis sessions.  When asked about outpatient dialysis, he reports that he has not been able to set up with an outpatient facility.  States that he needs a psychiatric clearance before he can be seen at a facility.  Here today without any specific concerns such as leg swelling, shortness of breath, fever, chills, nausea, vomiting.  Will place orders for basic lab evaluation and consult nephrology. Physical exam is unremarkable.  No abnormal heart or lung sounds.  No appreciable lower extremity edema.  Palpable thrill and bruit at the AV fistula site on the right upper extremity. Consult to nephrology placed. Spoke with Jadene Maxwell, nephrology NP, will plan for dialysis this afternoon as morning slots are filled.  Anticipate likely discharge after patient returns from dialysis session to the ED assuming no change or new complaints. Will ultimately be d/c/dispo per afternoon/night provider depending on time patient is moved for dialysis.  Final Clinical Impression(s) / ED Diagnoses Final diagnoses:  ESRD (end stage renal disease) on dialysis [facility])    Rx / DC Orders ED Discharge Orders     None         Concetta Dee, PA-C 09/12/23 1545    Lowery Rue, DO 09/12/23 1548

## 2023-09-13 ENCOUNTER — Telehealth: Payer: Self-pay | Admitting: Internal Medicine

## 2023-09-13 NOTE — Telephone Encounter (Signed)
 Please give appt with me to discuss placement at one of the community dialysis centers.

## 2023-09-15 ENCOUNTER — Encounter: Payer: Self-pay | Admitting: Internal Medicine

## 2023-09-15 ENCOUNTER — Ambulatory Visit: Attending: Internal Medicine | Admitting: Internal Medicine

## 2023-09-15 ENCOUNTER — Encounter (HOSPITAL_COMMUNITY): Payer: Self-pay | Admitting: *Deleted

## 2023-09-15 ENCOUNTER — Emergency Department (HOSPITAL_COMMUNITY)
Admission: EM | Admit: 2023-09-15 | Discharge: 2023-09-15 | Discharge status: Against Medical Advice | Attending: Nephrology | Admitting: Nephrology

## 2023-09-15 ENCOUNTER — Other Ambulatory Visit: Payer: Self-pay

## 2023-09-15 VITALS — BP 128/83 | HR 88 | Temp 98.4°F | Resp 14 | Ht 63.0 in | Wt 174.0 lb

## 2023-09-15 DIAGNOSIS — Z5321 Procedure and treatment not carried out due to patient leaving prior to being seen by health care provider: Secondary | ICD-10-CM | POA: Diagnosis not present

## 2023-09-15 DIAGNOSIS — N186 End stage renal disease: Secondary | ICD-10-CM | POA: Diagnosis not present

## 2023-09-15 DIAGNOSIS — Z452 Encounter for adjustment and management of vascular access device: Secondary | ICD-10-CM | POA: Insufficient documentation

## 2023-09-15 DIAGNOSIS — Z139 Encounter for screening, unspecified: Secondary | ICD-10-CM

## 2023-09-15 DIAGNOSIS — Z992 Dependence on renal dialysis: Secondary | ICD-10-CM

## 2023-09-15 DIAGNOSIS — I120 Hypertensive chronic kidney disease with stage 5 chronic kidney disease or end stage renal disease: Secondary | ICD-10-CM | POA: Diagnosis not present

## 2023-09-15 DIAGNOSIS — F250 Schizoaffective disorder, bipolar type: Secondary | ICD-10-CM

## 2023-09-15 LAB — CBC
HCT: 21.8 % — ABNORMAL LOW (ref 39.0–52.0)
Hemoglobin: 7.3 g/dL — ABNORMAL LOW (ref 13.0–17.0)
MCH: 34.4 pg — ABNORMAL HIGH (ref 26.0–34.0)
MCHC: 33.5 g/dL (ref 30.0–36.0)
MCV: 102.8 fL — ABNORMAL HIGH (ref 80.0–100.0)
Platelets: 261 10*3/uL (ref 150–400)
RBC: 2.12 MIL/uL — ABNORMAL LOW (ref 4.22–5.81)
RDW: 14.4 % (ref 11.5–15.5)
WBC: 9.4 10*3/uL (ref 4.0–10.5)
nRBC: 0.2 % (ref 0.0–0.2)

## 2023-09-15 LAB — RENAL FUNCTION PANEL
Albumin: 3.1 g/dL — ABNORMAL LOW (ref 3.5–5.0)
Anion gap: 19 — ABNORMAL HIGH (ref 5–15)
BUN: 77 mg/dL — ABNORMAL HIGH (ref 6–20)
CO2: 20 mmol/L — ABNORMAL LOW (ref 22–32)
Calcium: 7.5 mg/dL — ABNORMAL LOW (ref 8.9–10.3)
Chloride: 98 mmol/L (ref 98–111)
Creatinine, Ser: 10.9 mg/dL — ABNORMAL HIGH (ref 0.61–1.24)
GFR, Estimated: 5 mL/min — ABNORMAL LOW (ref >60)
Glucose, Bld: 100 mg/dL — ABNORMAL HIGH (ref 70–99)
Phosphorus: 9.2 mg/dL — ABNORMAL HIGH (ref 2.5–4.6)
Potassium: 5.1 mmol/L (ref 3.5–5.1)
Sodium: 137 mmol/L (ref 135–145)

## 2023-09-15 MED ORDER — CHLORHEXIDINE GLUCONATE CLOTH 2 % EX PADS: 6.0000 | MEDICATED_PAD | Freq: Every day | CUTANEOUS | Status: DC

## 2023-09-15 MED ORDER — HEPARIN SODIUM (PORCINE) 1000 UNIT/ML IJ SOLN
INTRAMUSCULAR | Status: AC
  Filled 2023-09-15: qty 2

## 2023-09-15 NOTE — Patient Instructions (Signed)
 A referral for you to see a behavioral health specialist through [facility].  They will call you with this appointment.  Please make sure that your cell phone number is updated in the system.

## 2023-09-15 NOTE — Procedures (Addendum)
 Last OP HD unit info --> just released 07/2023 from [facility] 4h  B400  76.5kg  2K bath    RUA AVF  Heparin  2400    Clinical issues other than volume: 1) Anemia of esrd:  - pt dose not have a nephrologist or HD unit so does not have access to ESA or IV iron  other than when here for hospital HD sessions - recent Hb have been in the 7- 9 range - last 3 doses of esa were: - darbe 40 mcg 08/18/23 - darbe 40 mcg 09/03/23 - darbe 40 mcg 09/10/23 - tsat 31% and ferritin 1175 on 07/21/23 - Hb today 7.3  Plan - no esa today  - will increase to 100 mcg w/ next dose 5/14 or later - standing wt was 81.9kg, they took off 2.5 L, bp's stable  I was present at the procedure, reviewed the HD regimen and made appropriate changes.   Larry Poag MD  [facility] 09/15/2023, 11:11 AM

## 2023-09-15 NOTE — Progress Notes (Signed)
 Needs a note to get back in hemo dialysis Out of Zyprexa  for several mos now.

## 2023-09-15 NOTE — ED Notes (Signed)
Dialysis aware patient is here.

## 2023-09-15 NOTE — Progress Notes (Signed)
 Late Note Entry- Sep 15, 2023  Unable to meet or speak with pt this morning while receiving HD. Spoke to pt via phone. Inquired of pt if he ever heard from PCP office staff regarding referral being made to out-pt mental health provider. Pt states he has spoken to office staff and advised them of where he would like referral be made to. Pt has not heard anything back as of yet regarding referral being made and possible appt time. Pt to attempt to contact either PCP office or mental health provider in the next few days to see if referral has been made.   Lauraine Polite Renal Navigator [phone]

## 2023-09-15 NOTE — ED Triage Notes (Signed)
 Patient is here for dialysis only no complaints.
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
## 2023-09-17 ENCOUNTER — Other Ambulatory Visit (HOSPITAL_COMMUNITY): Payer: Self-pay

## 2023-09-17 ENCOUNTER — Other Ambulatory Visit: Payer: Self-pay

## 2023-09-17 ENCOUNTER — Encounter (HOSPITAL_COMMUNITY): Payer: Self-pay | Admitting: Emergency Medicine

## 2023-09-17 ENCOUNTER — Emergency Department (HOSPITAL_COMMUNITY)
Admission: EM | Admit: 2023-09-17 | Discharge: 2023-09-19 | Disposition: A | Discharge status: Home / Self Care | Attending: Emergency Medicine | Admitting: Emergency Medicine

## 2023-09-17 DIAGNOSIS — Z5329 Procedure and treatment not carried out because of patient's decision for other reasons: Secondary | ICD-10-CM | POA: Insufficient documentation

## 2023-09-17 DIAGNOSIS — D649 Anemia, unspecified: Secondary | ICD-10-CM | POA: Diagnosis not present

## 2023-09-17 DIAGNOSIS — Z992 Dependence on renal dialysis: Secondary | ICD-10-CM | POA: Insufficient documentation

## 2023-09-17 DIAGNOSIS — I120 Hypertensive chronic kidney disease with stage 5 chronic kidney disease or end stage renal disease: Secondary | ICD-10-CM | POA: Diagnosis not present

## 2023-09-17 DIAGNOSIS — Z4931 Encounter for adequacy testing for hemodialysis: Secondary | ICD-10-CM | POA: Diagnosis not present

## 2023-09-17 DIAGNOSIS — N186 End stage renal disease: Secondary | ICD-10-CM | POA: Insufficient documentation

## 2023-09-17 DIAGNOSIS — E875 Hyperkalemia: Secondary | ICD-10-CM | POA: Diagnosis not present

## 2023-09-17 LAB — CBC
HCT: 23.7 % — ABNORMAL LOW (ref 39.0–52.0)
Hemoglobin: 8 g/dL — ABNORMAL LOW (ref 13.0–17.0)
MCH: 35.1 pg — ABNORMAL HIGH (ref 26.0–34.0)
MCHC: 33.8 g/dL (ref 30.0–36.0)
MCV: 103.9 fL — ABNORMAL HIGH (ref 80.0–100.0)
Platelets: 269 10*3/uL (ref 150–400)
RBC: 2.28 MIL/uL — ABNORMAL LOW (ref 4.22–5.81)
RDW: 15 % (ref 11.5–15.5)
WBC: 8.3 10*3/uL (ref 4.0–10.5)
nRBC: 0 % (ref 0.0–0.2)

## 2023-09-17 LAB — COMPREHENSIVE METABOLIC PANEL WITH GFR
ALT: 13 U/L (ref 0–44)
AST: 20 U/L (ref 15–41)
Albumin: 3.5 g/dL (ref 3.5–5.0)
Alkaline Phosphatase: 61 U/L (ref 38–126)
Anion gap: 17 — ABNORMAL HIGH (ref 5–15)
BUN: 69 mg/dL — ABNORMAL HIGH (ref 6–20)
CO2: 24 mmol/L (ref 22–32)
Calcium: 7.6 mg/dL — ABNORMAL LOW (ref 8.9–10.3)
Chloride: 98 mmol/L (ref 98–111)
Creatinine, Ser: 10.46 mg/dL — ABNORMAL HIGH (ref 0.61–1.24)
GFR, Estimated: 5 mL/min — ABNORMAL LOW (ref >60)
Glucose, Bld: 94 mg/dL (ref 70–99)
Potassium: 5.2 mmol/L — ABNORMAL HIGH (ref 3.5–5.1)
Sodium: 139 mmol/L (ref 135–145)
Total Bilirubin: 0.9 mg/dL (ref 0.0–1.2)
Total Protein: 7.6 g/dL (ref 6.5–8.1)

## 2023-09-17 MED ORDER — CHLORHEXIDINE GLUCONATE CLOTH 2 % EX PADS: 6.0000 | MEDICATED_PAD | Freq: Every day | CUTANEOUS | Status: DC

## 2023-09-17 MED ORDER — PENTAFLUOROPROP-TETRAFLUOROETH EX AERO: 1.0000 | INHALATION_SPRAY | CUTANEOUS | Status: DC | PRN

## 2023-09-17 MED ORDER — HEPARIN SODIUM (PORCINE) 1000 UNIT/ML DIALYSIS
2400.0000 [IU] | Freq: Once | INTRAMUSCULAR | Status: AC
  Administered 2023-09-17: 2400 [IU] via INTRAVENOUS_CENTRAL

## 2023-09-17 MED ORDER — LIDOCAINE-PRILOCAINE 2.5-2.5 % EX CREA: 1.0000 | TOPICAL_CREAM | CUTANEOUS | Status: DC | PRN

## 2023-09-17 MED ORDER — NEPRO/CARBSTEADY PO LIQD: 237.0000 mL | ORAL | Status: DC | PRN

## 2023-09-17 MED ORDER — ALTEPLASE 2 MG IJ SOLR: 2.0000 mg | Freq: Once | INTRAMUSCULAR | Status: DC | PRN

## 2023-09-17 MED ORDER — DARBEPOETIN ALFA 100 MCG/0.5ML IJ SOSY
100.0000 ug | PREFILLED_SYRINGE | Freq: Once | INTRAMUSCULAR | Status: DC
  Filled 2023-09-17: qty 0.5

## 2023-09-17 MED ORDER — ANTICOAGULANT SODIUM CITRATE 4% (200MG/5ML) IV SOLN: 5.0000 mL | Status: DC | PRN

## 2023-09-17 MED ORDER — HEPARIN SODIUM (PORCINE) 1000 UNIT/ML DIALYSIS: 1000.0000 [IU] | INTRAMUSCULAR | Status: DC | PRN

## 2023-09-17 MED ORDER — HEPARIN SODIUM (PORCINE) 1000 UNIT/ML IJ SOLN
INTRAMUSCULAR | Status: AC
  Filled 2023-09-17: qty 3

## 2023-09-17 MED ORDER — LIDOCAINE HCL (PF) 1 % IJ SOLN: 5.0000 mL | INTRAMUSCULAR | Status: DC | PRN

## 2023-09-17 NOTE — ED Provider Notes (Signed)
 [facility] AT [facility] Provider Note   CSN: [id number] Arrival date & time: 09/17/23  9528     History Chief Complaint  Patient presents with   needs dialysis    Paul [name] is a 58 y.o. male history of schizophrenia, ESRD MWF presents to the ER for dialysis. This patient is known to the ER. He denies any chest pain, SOB, or edema. No changes or complications since his last session on Monday (09/15/23). Reports he is at his baseline status.   HPI     Home Medications Prior to Admission medications   Medication Sig Start Date End Date Taking? Authorizing Provider  carvedilol  (COREG ) 6.25 MG tablet Take 1 tablet (6.25 mg total) by mouth 2 (two) times daily with a meal. Patient taking differently: Take 6.25 mg by mouth daily. 07/29/23 09/15/23  Tawkaliyar, Roya, DO  isosorbide  mononitrate (IMDUR ) 30 MG 24 hr tablet Take 1 tablet (30 mg total) by mouth daily. 07/29/23   Tawkaliyar, Roya, DO  OLANZapine  zydis (ZYPREXA ) 15 MG disintegrating tablet Take 15 mg by mouth at bedtime. Patient not taking: Reported on 09/03/2023    [provider]  rosuvastatin  (CRESTOR ) 5 MG tablet Take 1 tablet (5 mg total) by mouth daily. 03/24/23     sucroferric oxyhydroxide (VELPHORO ) 500 MG chewable tablet Chew 3 tablets (1,500 mg total) by mouth 3 (three) times daily with meals. Patient taking differently: Chew 500 mg by mouth in the morning and at bedtime. 07/29/23   Tawkaliyar, Roya, DO      Allergies    Penicillin g    Review of Systems   Review of Systems  Constitutional:  Negative for fever.  Respiratory:  Negative for shortness of breath.   Cardiovascular:  Negative for chest pain and leg swelling.    Physical Exam Updated Vital Signs BP (!) 163/90 (BP Location: Left Arm)   Pulse 85   Temp (!) 97.4 F (36.3 C)   Resp 18   SpO2 100%  Physical Exam Vitals and nursing note reviewed.  Constitutional:      General: He is not in acute  distress.    Appearance: He is not toxic-appearing.  Eyes:     General: No scleral icterus.    Comments: Right eye closed  Pulmonary:     Effort: Pulmonary effort is normal. No respiratory distress.  Musculoskeletal:     Right lower leg: No edema.     Left lower leg: No edema.  Skin:    General: Skin is warm and dry.  Neurological:     Mental Status: He is alert.     ED Results / Procedures / Treatments   Labs (all labs ordered are listed, but only abnormal results are displayed) Labs Reviewed  CBC  COMPREHENSIVE METABOLIC PANEL WITH GFR    EKG None  Radiology No results found.  Procedures   Medications Ordered in ED Medications - No data to display  ED Course/ Medical Decision Making/ A&P Clinical Course as of 09/17/23 0825  Wed Sep 17, 2023  0752 Spoke with Dr. Zana Hesselbach [RR]    Clinical Course User Index [RR] Spence Dux, PA-C    Medical Decision Making Amount and/or Complexity of Data Reviewed Labs: ordered.   58 y.o. male presents to the ER for evaluation of dialysis. Differential diagnosis includes but is not limited to volume overload, electrolyte abnormality, dialysis. Vital signs elevated BP at 163/90, temp 97.75F otherwise unremarkable. Physical exam as noted above.  I independently reviewed and interpreted the patient's labs.  CBC shows chronic anemia consistent with patient's baseline.  No leukocytosis.  CMP does show potassium 5.2, BUN 69, creatinine 10.42, calcium  is 7.6 with an anion gap of 17.  Patient's not having chest pain or shortness of breath.  Will be getting dialysis to correct for his hyperkalemia. His calcium  appears to be at it's baseline.   Dr. Zana Hesselbach is aware that the patient is here for dialysis.   Patient at dialysis.   Portions of this report may have been transcribed using voice recognition software. Every effort was made to ensure accuracy; however, inadvertent computerized transcription errors may be present.    Final  Clinical Impression(s) / ED Diagnoses Final diagnoses:  ESRD (end stage renal disease) [facility])    Rx / DC Orders ED Discharge Orders     None         Spence Dux, PA-C 09/17/23 1558    Kingsley, Victoria K, DO 09/17/23 1601

## 2023-09-17 NOTE — Procedures (Signed)
 Received patient in bed to unit.  Alert and oriented.  Informed consent signed and in chart.   TX duration: 3 hours 15 min  Patient tolerated well.  Transported back to the ED. Alert, without acute distress.  Hand-off given to patient's nurse.   Access used: right fistula Access issues: none  Total UF removed: 3.9 liters Medication(s) given: none   Clover Dao, RN Kidney Dialysis Unit

## 2023-09-17 NOTE — Telephone Encounter (Signed)
 I saw this patient on Monday of this week for this issue already.  Please cancel the appointment that you made for him in June.  He can keep his regular scheduled appointment with me that is scheduled for end of July.

## 2023-09-17 NOTE — Telephone Encounter (Signed)
 FYI:  Called & spoke to the patient. Verified name & DOB. Informed that an appointment is needed to discuss dialysis placement with PCP at one of the community dialysis centers. Patient requested Tuesday / Thursday only. Appointment scheduled for 10/30/2023. Patient expressed verbal understanding of all discussed.

## 2023-09-17 NOTE — Progress Notes (Signed)
 Received a call from pt earlier today. Pt states he has an appt scheduled to see a psychiatrist on Friday, May 23. Navigator requested that pt please contact navigator after appt to see how appt went and to discuss how best way to proceed going forward regarding out-pt HD clinic placement.   Lauraine Polite Renal Navigator [phone]

## 2023-09-17 NOTE — Procedures (Signed)
 Last OP HD unit info --> just released 07/2023 from [facility] 4h  B400  76.5kg  2K bath    RUA AVF  Heparin  2400    Clinical issues other than volume: 1) Anemia of esrd:  - pt dose not have a nephrologist or HD unit so does not have access to ESA or IV iron  other than when here for hospital HD sessions - recent Hb have been in the 7- 9 range - last 3 doses of esa were: - darbe 40 mcg 08/18/23 - darbe 40 mcg 09/03/23 - darbe 40 mcg 09/10/23 - tsat 31% and ferritin 1175 on 07/21/23 - Hb today 7.3   Plan - will increase to 100 mcg w/ next dose today if possible  I was present at the procedure, reviewed the HD regimen and made appropriate changes.   Larry Poag MD  [facility] 09/17/2023, 4:02 PM

## 2023-09-17 NOTE — Telephone Encounter (Signed)
 Called & spoke to the patient. Verified name & DOB. Informed to disregard previous call. Cancelled appointment in June. Confirmed appointment with patient in July. Patient expressed verbal understanding.

## 2023-09-17 NOTE — ED Triage Notes (Signed)
 Pt reports he is here for dialysis, last treatment was Monday.  No other complaints.

## 2023-09-17 NOTE — Telephone Encounter (Signed)
 Already addressed.  Pt seen 09/15/2023.

## 2023-09-18 NOTE — Telephone Encounter (Signed)
 Noted.

## 2023-09-19 ENCOUNTER — Emergency Department (HOSPITAL_COMMUNITY)
Admission: EM | Admit: 2023-09-19 | Discharge: 2023-09-20 | Discharge status: Against Medical Advice | Source: Home / Self Care | Attending: Emergency Medicine | Admitting: Emergency Medicine

## 2023-09-19 ENCOUNTER — Other Ambulatory Visit: Payer: Self-pay

## 2023-09-19 ENCOUNTER — Encounter (HOSPITAL_COMMUNITY): Payer: Self-pay | Admitting: Emergency Medicine

## 2023-09-19 DIAGNOSIS — I120 Hypertensive chronic kidney disease with stage 5 chronic kidney disease or end stage renal disease: Secondary | ICD-10-CM | POA: Diagnosis not present

## 2023-09-19 DIAGNOSIS — Z5329 Procedure and treatment not carried out because of patient's decision for other reasons: Secondary | ICD-10-CM | POA: Insufficient documentation

## 2023-09-19 DIAGNOSIS — Z992 Dependence on renal dialysis: Secondary | ICD-10-CM | POA: Diagnosis not present

## 2023-09-19 DIAGNOSIS — Z4931 Encounter for adequacy testing for hemodialysis: Secondary | ICD-10-CM | POA: Insufficient documentation

## 2023-09-19 DIAGNOSIS — N186 End stage renal disease: Secondary | ICD-10-CM | POA: Diagnosis not present

## 2023-09-19 LAB — COMPREHENSIVE METABOLIC PANEL WITH GFR
ALT: 18 U/L (ref 0–44)
AST: 13 U/L — ABNORMAL LOW (ref 15–41)
Albumin: 3.8 g/dL (ref 3.5–5.0)
Alkaline Phosphatase: 58 U/L (ref 38–126)
Anion gap: 19 — ABNORMAL HIGH (ref 5–15)
BUN: 72 mg/dL — ABNORMAL HIGH (ref 6–20)
CO2: 21 mmol/L — ABNORMAL LOW (ref 22–32)
Calcium: 8.1 mg/dL — ABNORMAL LOW (ref 8.9–10.3)
Chloride: 96 mmol/L — ABNORMAL LOW (ref 98–111)
Creatinine, Ser: 10.95 mg/dL — ABNORMAL HIGH (ref 0.61–1.24)
GFR, Estimated: 5 mL/min — ABNORMAL LOW (ref >60)
Glucose, Bld: 117 mg/dL — ABNORMAL HIGH (ref 70–99)
Potassium: 4.9 mmol/L (ref 3.5–5.1)
Sodium: 136 mmol/L (ref 135–145)
Total Bilirubin: 0.6 mg/dL (ref 0.0–1.2)
Total Protein: 7.8 g/dL (ref 6.5–8.1)

## 2023-09-19 LAB — CBC WITH DIFFERENTIAL/PLATELET
Abs Immature Granulocytes: 0.15 10*3/uL — ABNORMAL HIGH (ref 0.00–0.07)
Basophils Absolute: 0.1 10*3/uL (ref 0.0–0.1)
Basophils Relative: 1 %
Eosinophils Absolute: 0.1 10*3/uL (ref 0.0–0.5)
Eosinophils Relative: 2 %
HCT: 26.2 % — ABNORMAL LOW (ref 39.0–52.0)
Hemoglobin: 8.6 g/dL — ABNORMAL LOW (ref 13.0–17.0)
Immature Granulocytes: 2 %
Lymphocytes Relative: 21 %
Lymphs Abs: 1.8 10*3/uL (ref 0.7–4.0)
MCH: 33.9 pg (ref 26.0–34.0)
MCHC: 32.8 g/dL (ref 30.0–36.0)
MCV: 103.1 fL — ABNORMAL HIGH (ref 80.0–100.0)
Monocytes Absolute: 1.1 10*3/uL — ABNORMAL HIGH (ref 0.1–1.0)
Monocytes Relative: 14 %
Neutro Abs: 5 10*3/uL (ref 1.7–7.7)
Neutrophils Relative %: 60 %
Platelets: 288 10*3/uL (ref 150–400)
RBC: 2.54 MIL/uL — ABNORMAL LOW (ref 4.22–5.81)
RDW: 15.6 % — ABNORMAL HIGH (ref 11.5–15.5)
WBC: 8.2 10*3/uL (ref 4.0–10.5)
nRBC: 0 % (ref 0.0–0.2)

## 2023-09-19 MED ORDER — HEPARIN SODIUM (PORCINE) 1000 UNIT/ML IJ SOLN
INTRAMUSCULAR | Status: AC
  Filled 2023-09-19: qty 3

## 2023-09-19 MED ORDER — HEPARIN SODIUM (PORCINE) 1000 UNIT/ML DIALYSIS
2400.0000 [IU] | INTRAMUSCULAR | Status: DC | PRN
  Administered 2023-09-19: 2400 [IU] via INTRAVENOUS_CENTRAL

## 2023-09-19 MED ORDER — DARBEPOETIN ALFA 100 MCG/0.5ML IJ SOSY
100.0000 ug | PREFILLED_SYRINGE | INTRAMUSCULAR | Status: DC
  Filled 2023-09-19: qty 0.5

## 2023-09-19 MED ORDER — CHLORHEXIDINE GLUCONATE CLOTH 2 % EX PADS: 6.0000 | MEDICATED_PAD | Freq: Every day | CUTANEOUS | Status: DC

## 2023-09-19 NOTE — Progress Notes (Signed)
   09/19/23 1445  Vitals  Temp 97.7 F (36.5 C)  Pulse Rate 97  Resp 16  BP 131/78  SpO2 100 %  O2 Device Room Air  Weight 77.3 kg  Post Treatment  Dialyzer Clearance Lightly streaked  Hemodialysis Intake (mL) 0 mL  Liters Processed 75  Fluid Removed (mL) 2200 mL  Tolerated HD Treatment Yes  AVG/AVF Arterial Site Held (minutes) 8 minutes  AVG/AVF Venous Site Held (minutes) 8 minutes   Received patient in bed to unit.  Alert and oriented.  Informed consent signed and in chart.   TX duration:3.5HRS  Patient tolerated well.  Transported back to the room  Alert, without acute distress.  Hand-off given to patient's nurse.   Access used: RUA AVF Access issues: NONE  Total UF removed: 2.2L Medication(s) given: NONE   Na'Shaminy T [facility]

## 2023-09-19 NOTE — ED Triage Notes (Signed)
 Pt states he is here for dialysis - usually MWF, last session was Wednesday. Denies any cp or sob

## 2023-09-19 NOTE — Progress Notes (Signed)
 Asked to see this patient for hospital dialysis. Pt was discharged from his outpatient unit about in March 2025 for behavior issues. The plan will be for "ED HD". Pt is not to be admitted at this time. Pt will go to the dialysis unit when they are ready for the patient. When dialysis is completed pt will be sent back to ED for reassessment.    Last OP HD unit info --> just released 07/2023 from [facility] 4h  B400  76.5kg  2K bath    RUA AVF  Heparin  2400    Clinical issues other than volume: 1) Anemia of esrd:  - pt dose not have a nephrologist or HD unit so does not have access to ESA or IV iron  other than when here for hospital HD sessions - recent Hb have been in the 7- 9 range - last 3 doses of esa were: - darbe 40 mcg 08/18/23 - darbe 40 mcg 09/03/23 - darbe 40 mcg 09/10/23  - last tsat 24% on 09/05/23, ferritin 1828 - Hb today 8.6   Plan - will increase to 100 mcg w/ next dose today if possible - give IV Fe next HD if possible

## 2023-09-19 NOTE — ED Notes (Signed)
Dialysis aware patient is here.

## 2023-09-19 NOTE — ED Provider Notes (Signed)
 [place] EMERGENCY DEPARTMENT AT [facility] Provider Note   CSN: [id number] Arrival date & time: 09/19/23  0636     History  Chief Complaint  Patient presents with   Needs Dialysis    Paul [name] is a 58 y.o. male.  Patient is a 58 year old male with end-stage renal disease currently on dialysis Monday Wednesday Friday who does not have a center to dialyze.  He is currently coming to the hospital for routine dialysis.  His last dialysis was on Wednesday.  He is just here for his scheduled dialysis and has no other complaints at this time.  The history is provided by the patient and medical records.       Home Medications Prior to Admission medications   Medication Sig Start Date End Date Taking? Authorizing Provider  carvedilol  (COREG ) 6.25 MG tablet Take 1 tablet (6.25 mg total) by mouth 2 (two) times daily with a meal. Patient taking differently: Take 6.25 mg by mouth daily. 07/29/23 09/15/23  Tawkaliyar, Roya, DO  isosorbide  mononitrate (IMDUR ) 30 MG 24 hr tablet Take 1 tablet (30 mg total) by mouth daily. 07/29/23   Tawkaliyar, Roya, DO  OLANZapine  zydis (ZYPREXA ) 15 MG disintegrating tablet Take 15 mg by mouth at bedtime. Patient not taking: Reported on 09/03/2023    [provider]  rosuvastatin  (CRESTOR ) 5 MG tablet Take 1 tablet (5 mg total) by mouth daily. 03/24/23     sucroferric oxyhydroxide (VELPHORO ) 500 MG chewable tablet Chew 3 tablets (1,500 mg total) by mouth 3 (three) times daily with meals. Patient taking differently: Chew 500 mg by mouth in the morning and at bedtime. 07/29/23   Tawkaliyar, Roya, DO      Allergies    Penicillin g    Review of Systems   Review of Systems  Physical Exam Updated Vital Signs BP (!) 153/91 (BP Location: Right Arm)   Pulse 85   Temp 98 F (36.7 C)   Resp 18   Wt 77.6 kg   SpO2 99%   BMI 30.30 kg/m  Physical Exam Vitals and nursing note reviewed.  Constitutional:      General: He is not in  acute distress.    Appearance: He is well-developed.  HENT:     Head: Normocephalic and atraumatic.  Eyes:     Conjunctiva/sclera: Conjunctivae normal.     Pupils: Pupils are equal, round, and reactive to light.  Cardiovascular:     Rate and Rhythm: Normal rate and regular rhythm.     Heart sounds: No murmur heard. Pulmonary:     Effort: Pulmonary effort is normal. No respiratory distress.     Breath sounds: Normal breath sounds. No wheezing or rales.  Musculoskeletal:        General: No tenderness. Normal range of motion.     Cervical back: Normal range of motion and neck supple.  Skin:    General: Skin is warm and dry.     Findings: No erythema or rash.  Neurological:     Mental Status: He is alert and oriented to person, place, and time.  Psychiatric:        Behavior: Behavior normal.     ED Results / Procedures / Treatments   Labs (all labs ordered are listed, but only abnormal results are displayed) Labs Reviewed  CBC WITH DIFFERENTIAL/PLATELET - Abnormal; Notable for the following components:      Result Value   RBC 2.54 (*)    Hemoglobin 8.6 (*)  HCT 26.2 (*)    MCV 103.1 (*)    RDW 15.6 (*)    Monocytes Absolute 1.1 (*)    Abs Immature Granulocytes 0.15 (*)    All other components within normal limits  COMPREHENSIVE METABOLIC PANEL WITH GFR - Abnormal; Notable for the following components:   Chloride 96 (*)    CO2 21 (*)    Glucose, Bld 117 (*)    BUN 72 (*)    Creatinine, Ser 10.95 (*)    Calcium  8.1 (*)    AST 13 (*)    GFR, Estimated 5 (*)    Anion gap 19 (*)    All other components within normal limits  HEPATITIS B SURFACE ANTIGEN  HEPATITIS B SURFACE ANTIBODY, QUANTITATIVE    EKG None  Radiology No results found.  Procedures Procedures    Medications Ordered in ED Medications  Chlorhexidine  Gluconate Cloth 2 % PADS 6 each (has no administration in time range)    ED Course/ Medical Decision Making/ A&P                                  Medical Decision Making Amount and/or Complexity of Data Reviewed Labs: ordered.   Patient is here for his routine scheduled dialysis.  He has no complaints at this time.  Consulted nephrology and Dr. Jonah Negus is aware.  Patient had lab work done earlier which I independently interpreted which shows stable hemoglobin, CMP consistent with end-stage renal disease but normal electrolytes.        Final Clinical Impression(s) / ED Diagnoses Final diagnoses:  None    Rx / DC Orders ED Discharge Orders     None         Almond Army, MD 09/19/23 1555

## 2023-09-19 NOTE — Procedures (Addendum)
     I was present at the procedure, reviewed the HD regimen and made appropriate changes.   Larry Poag MD  [facility] 09/19/2023, 12:57 PM

## 2023-09-22 ENCOUNTER — Encounter (HOSPITAL_COMMUNITY): Payer: Self-pay | Admitting: *Deleted

## 2023-09-22 ENCOUNTER — Emergency Department (HOSPITAL_COMMUNITY)
Admission: EM | Admit: 2023-09-22 | Discharge: 2023-09-23 | Discharge status: Against Medical Advice | Attending: Emergency Medicine | Admitting: Emergency Medicine

## 2023-09-22 ENCOUNTER — Other Ambulatory Visit: Payer: Self-pay

## 2023-09-22 DIAGNOSIS — Z992 Dependence on renal dialysis: Secondary | ICD-10-CM | POA: Insufficient documentation

## 2023-09-22 DIAGNOSIS — I120 Hypertensive chronic kidney disease with stage 5 chronic kidney disease or end stage renal disease: Secondary | ICD-10-CM | POA: Diagnosis not present

## 2023-09-22 DIAGNOSIS — D631 Anemia in chronic kidney disease: Secondary | ICD-10-CM | POA: Diagnosis not present

## 2023-09-22 DIAGNOSIS — I509 Heart failure, unspecified: Secondary | ICD-10-CM | POA: Diagnosis not present

## 2023-09-22 DIAGNOSIS — Z5329 Procedure and treatment not carried out because of patient's decision for other reasons: Secondary | ICD-10-CM | POA: Insufficient documentation

## 2023-09-22 DIAGNOSIS — N186 End stage renal disease: Secondary | ICD-10-CM | POA: Insufficient documentation

## 2023-09-22 DIAGNOSIS — F209 Schizophrenia, unspecified: Secondary | ICD-10-CM | POA: Insufficient documentation

## 2023-09-22 LAB — COMPREHENSIVE METABOLIC PANEL WITH GFR
ALT: 14 U/L (ref 0–44)
AST: 10 U/L — ABNORMAL LOW (ref 15–41)
Albumin: 3.3 g/dL — ABNORMAL LOW (ref 3.5–5.0)
Alkaline Phosphatase: 56 U/L (ref 38–126)
Anion gap: 20 — ABNORMAL HIGH (ref 5–15)
BUN: 76 mg/dL — ABNORMAL HIGH (ref 6–20)
CO2: 19 mmol/L — ABNORMAL LOW (ref 22–32)
Calcium: 7.9 mg/dL — ABNORMAL LOW (ref 8.9–10.3)
Chloride: 99 mmol/L (ref 98–111)
Creatinine, Ser: 12.94 mg/dL — ABNORMAL HIGH (ref 0.61–1.24)
GFR, Estimated: 4 mL/min — ABNORMAL LOW (ref >60)
Glucose, Bld: 84 mg/dL (ref 70–99)
Potassium: 4.8 mmol/L (ref 3.5–5.1)
Sodium: 138 mmol/L (ref 135–145)
Total Bilirubin: 1 mg/dL (ref 0.0–1.2)
Total Protein: 6.6 g/dL (ref 6.5–8.1)

## 2023-09-22 LAB — CBC WITH DIFFERENTIAL/PLATELET
Abs Immature Granulocytes: 0.08 10*3/uL — ABNORMAL HIGH (ref 0.00–0.07)
Basophils Absolute: 0 10*3/uL (ref 0.0–0.1)
Basophils Relative: 1 %
Eosinophils Absolute: 0.1 10*3/uL (ref 0.0–0.5)
Eosinophils Relative: 2 %
HCT: 22.9 % — ABNORMAL LOW (ref 39.0–52.0)
Hemoglobin: 7.7 g/dL — ABNORMAL LOW (ref 13.0–17.0)
Immature Granulocytes: 1 %
Lymphocytes Relative: 23 %
Lymphs Abs: 1.3 10*3/uL (ref 0.7–4.0)
MCH: 35 pg — ABNORMAL HIGH (ref 26.0–34.0)
MCHC: 33.6 g/dL (ref 30.0–36.0)
MCV: 104.1 fL — ABNORMAL HIGH (ref 80.0–100.0)
Monocytes Absolute: 0.7 10*3/uL (ref 0.1–1.0)
Monocytes Relative: 12 %
Neutro Abs: 3.5 10*3/uL (ref 1.7–7.7)
Neutrophils Relative %: 61 %
Platelets: 239 10*3/uL (ref 150–400)
RBC: 2.2 MIL/uL — ABNORMAL LOW (ref 4.22–5.81)
RDW: 15.3 % (ref 11.5–15.5)
WBC: 5.7 10*3/uL (ref 4.0–10.5)
nRBC: 0 % (ref 0.0–0.2)

## 2023-09-22 MED ORDER — DARBEPOETIN ALFA 100 MCG/0.5ML IJ SOSY
100.0000 ug | PREFILLED_SYRINGE | Freq: Once | INTRAMUSCULAR | Status: AC
  Administered 2023-09-22: 100 ug via SUBCUTANEOUS
  Filled 2023-09-22: qty 0.5

## 2023-09-22 MED ORDER — CHLORHEXIDINE GLUCONATE CLOTH 2 % EX PADS: 6.0000 | MEDICATED_PAD | Freq: Every day | CUTANEOUS | Status: DC

## 2023-09-22 NOTE — ED Provider Notes (Signed)
 [place] EMERGENCY DEPARTMENT AT [facility] Provider Note   CSN: [id number] Arrival date & time: 09/22/23  1308     History  Chief Complaint  Patient presents with   Vascular Access Problem    Paul [name] is a 58 y.o. male with history of ESRD, CHF, and schizophrenia presents the ED today for dialysis.  Patient received dialysis in the hospital on MWF, denies missing any recent sessions.  No chest pain or shortness of breath.  No additional complaints or concerns at this time.    Home Medications Prior to Admission medications   Medication Sig Start Date End Date Taking? Authorizing Provider  carvedilol  (COREG ) 6.25 MG tablet Take 1 tablet (6.25 mg total) by mouth 2 (two) times daily with a meal. Patient taking differently: Take 6.25 mg by mouth daily. 07/29/23 09/15/23  Tawkaliyar, Roya, DO  isosorbide  mononitrate (IMDUR ) 30 MG 24 hr tablet Take 1 tablet (30 mg total) by mouth daily. 07/29/23   Tawkaliyar, Roya, DO  OLANZapine  zydis (ZYPREXA ) 15 MG disintegrating tablet Take 15 mg by mouth at bedtime. Patient not taking: Reported on 09/03/2023    [provider]  rosuvastatin  (CRESTOR ) 5 MG tablet Take 1 tablet (5 mg total) by mouth daily. 03/24/23     sucroferric oxyhydroxide (VELPHORO ) 500 MG chewable tablet Chew 3 tablets (1,500 mg total) by mouth 3 (three) times daily with meals. Patient taking differently: Chew 500 mg by mouth in the morning and at bedtime. 07/29/23   Tawkaliyar, Roya, DO      Allergies    Penicillin g    Review of Systems   Review of Systems  Constitutional:        Needs dialysis  All other systems reviewed and are negative.   Physical Exam Updated Vital Signs BP (!) 146/98   Pulse 72   Temp 97.6 F (36.4 C)   Resp 17   Ht 5\' 3"  (1.6 m)   Wt 77.3 kg   SpO2 100%   BMI 30.19 kg/m  Physical Exam Vitals and nursing note reviewed.  Constitutional:      General: He is not in acute distress.    Appearance: Normal  appearance.  HENT:     Head: Normocephalic and atraumatic.     Mouth/Throat:     Mouth: Mucous membranes are moist.  Eyes:     Conjunctiva/sclera: Conjunctivae normal.     Pupils: Pupils are equal, round, and reactive to light.  Cardiovascular:     Rate and Rhythm: Normal rate and regular rhythm.     Pulses: Normal pulses.     Heart sounds: Normal heart sounds.  Pulmonary:     Effort: Pulmonary effort is normal.     Breath sounds: Normal breath sounds.  Abdominal:     Palpations: Abdomen is soft.     Tenderness: There is no abdominal tenderness.  Skin:    General: Skin is warm and dry.     Findings: No rash.  Neurological:     General: No focal deficit present.     Mental Status: He is alert.  Psychiatric:        Mood and Affect: Mood normal.        Behavior: Behavior normal.    ED Results / Procedures / Treatments   Labs (all labs ordered are listed, but only abnormal results are displayed) Labs Reviewed  COMPREHENSIVE METABOLIC PANEL WITH GFR - Abnormal; Notable for the following components:      Result Value  CO2 19 (*)    BUN 76 (*)    Creatinine, Ser 12.94 (*)    Calcium  7.9 (*)    Albumin 3.3 (*)    AST 10 (*)    GFR, Estimated 4 (*)    Anion gap 20 (*)    All other components within normal limits  CBC WITH DIFFERENTIAL/PLATELET - Abnormal; Notable for the following components:   RBC 2.20 (*)    Hemoglobin 7.7 (*)    HCT 22.9 (*)    MCV 104.1 (*)    MCH 35.0 (*)    Abs Immature Granulocytes 0.08 (*)    All other components within normal limits  HEPATITIS B SURFACE ANTIGEN    EKG None  Radiology No results found.  Procedures Procedures    Medications Ordered in ED Medications  Chlorhexidine  Gluconate Cloth 2 % PADS 6 each (has no administration in time range)  Darbepoetin Alfa  (ARANESP ) injection 100 mcg (has no administration in time range)    ED Course/ Medical Decision Making/ A&P                                 Medical Decision  Making Amount and/or Complexity of Data Reviewed Labs: ordered.   This patient presents to the ED for concern of needing dialysis, this involves an extensive number of treatment options, and is a complaint that carries with it a high risk of complications and morbidity.    Comorbidities  See HPI above   Additional History  Additional history obtained from prior records   Lab Tests  I ordered and personally interpreted labs.  The pertinent results include:   CMP and CBC are within normal limits for patient   Consultations  I requested consultation with Dr. Yvonnie Heritage with nephrology,  and discussed lab and imaging findings as well as pertinent plan - they recommend: she put in additional ordered needed. Patient on the list for dialysis today.   Problem List / ED Course / Critical Interventions / Medication Management  Patient receives dialysis on MWF and is here today for dialysis.  Has not missed any recent sessions.  Denies complaints or concerns at this time.   Social Determinants of Health  Access to healthcare   Test / Admission - Considered  Patient stable and safe for dialysis. Should be safe for discharge home after reassessment in the ED after dialysis as long as no new symptoms arise.       Final Clinical Impression(s) / ED Diagnoses Final diagnoses:  ESRD on dialysis [facility])    Rx / DC Orders ED Discharge Orders     None         Sonnie Dusky, PA-C 09/22/23 1424    Ninetta Basket, MD 09/23/23 1409

## 2023-09-22 NOTE — ED Triage Notes (Signed)
 Patient here for dialysis  no complaints,  Dialysis aware patient is here.

## 2023-09-22 NOTE — Procedures (Signed)
 Received patient in bed to unit.  Alert and oriented.  Informed consent signed and in chart.   TX duration: 3.5 hours  Patient tolerated well.  Transported back to the room  Alert, without acute distress.  Hand-off given to patient's nurse.   Access used: right fistula Access issues: none  Total UF removed: 1.3 liters Medication(s) given: aranesp    Clover Dao, RN [facility]

## 2023-09-23 ENCOUNTER — Other Ambulatory Visit: Payer: Self-pay

## 2023-09-23 NOTE — Patient Instructions (Signed)
 Visit Information  Thank you for taking time to visit with me today. Please don't hesitate to contact me if I can be of assistance to you before our next scheduled appointment.  Your next care management appointment is by telephone on 10/21/23 at 2 PM  Please call the care guide team at [phone] if you need to cancel, schedule, or reschedule an appointment.   Please call the Suicide and Crisis Lifeline: 988 call 1-800-273-TALK (toll free, 24 hour hotline) if you are experiencing a Mental Health or Behavioral Health Crisis or need someone to talk to.  Theodora Fish, RN MSN [place]  [facility] RN Care Manager Direct Dial : [phone]  Fax: [phone]

## 2023-09-23 NOTE — Patient Outreach (Signed)
 Complex Care Management   Visit Note  09/23/2023  Name:  Paul [name] MRN: [id number] DOB: 1966/01/07  Situation: Referral received for Complex Care Management related to ESRD I obtained verbal consent from Patient.  Visit completed with [name]  on the phone  Background:   Past Medical History:  Diagnosis Date   Bipolar disorder [facility])    Central retinal artery occlusion    with macular infarction of the right eye. status post posterior vitrectomy and photocoagulation with insertion of a shunt in 2006.   CHF (congestive heart failure) (HCC)    chronic mixed syst/diast. 02/2009 echo: Systolic function was mildly reduced. The estimated ejection fraction was in 45%, global HK, Grade I Diast dysfxn.   Chronic kidney disease    ESRD   Heart failure    Obesity    Osteoarthritis    s/p right hip replacement in 2001   Schizophrenia (HCC)    Severe uncontrolled hypertension     Assessment: Patient Reported Symptoms:  Cognitive Cognitive Status: Able to follow simple commands, Alert and oriented to person, place, and time, Normal speech and language skills Cognitive/Intellectual Conditions Management [RPT]: None reported or documented in medical history or problem list      Neurological Neurological Review of Symptoms: Not assessed    HEENT HEENT Symptoms Reported: Not assessed      Cardiovascular Cardiovascular Symptoms Reported: No symptoms reported Does patient have uncontrolled Hypertension?: Yes Is patient checking Blood Pressure at home?: No Patient's Recent BP reading at home: 144/90 at dialysis yesterday per patient (see vital signs flowsheet for readings taken in the ED where he gets dialysis) Cardiovascular Conditions: High blood cholesterol, Hypertension, Heart failure Cardiovascular Management Strategies: Medication therapy, Fluid modification Cardiovascular Comment: Patient does not check his weight at home. He reports that they monitor his weight at  dialysis. He denies new or unusual swelling at time of visit.  Respiratory Respiratory Symptoms Reported: Dry cough Additional Respiratory Details: Patient reports his dry cough has continued, but has improved slightly. He is not taking anything for cough.    Endocrine Patient reports the following symptoms related to hypoglycemia or hyperglycemia : Not assessed Is patient diabetic?: No    Gastrointestinal Gastrointestinal Symptoms Reported: Not assessed      Genitourinary Genitourinary Symptoms Reported: No symptoms reported Genitourinary Conditions: End-stage renal disease Genitourinary Management Strategies: Hemodialysis, Fluid modification Hemodialysis Schedule: M, W, F Hemodialysis Last Treatment: 09/22/23 Genitourinary Comment: Patient has not missed a dialysis session since our last visit. Encouraged him to continue to attend all dialysis sessions.  Integumentary Integumentary Symptoms Reported: No symptoms reported    Musculoskeletal Musculoskelatal Symptoms Reviewed: No symptoms reported Musculoskeletal Conditions: Osteoarthritis Falls in the past year?: No Number of falls in past year: 1 or less Was there an injury with Fall?: No Fall Risk Category Calculator: 0 Patient Fall Risk Level: Low Fall Risk Patient at Risk for Falls Due to: No Fall Risks Fall risk Follow up: Falls evaluation completed  Psychosocial Psychosocial Symptoms Reported: No symptoms reported Additional Psychological Details: Patient reports that he is scheduled to see psych 09/26/23. He is to contact renal navigator following this visit to discuss the best way to proceed going forward with placement into a dialysis center. Emphasized importance of keeping all psych visits and following their recommendations for therapy/medications so that he will be accepted to a dialysis center. Behavioral Health Conditions: Other Other Behavorial Health Conditions: Schizoaffective disorder, bipolar type; Schizophrenia  09/15/2023    1:57 PM  Depression screen PHQ 2/9  Decreased Interest 0  Down, Depressed, Hopeless 0  PHQ - 2 Score 0    There were no vitals filed for this visit.  Medications Reviewed Today     Reviewed by Valaria Garland, RN (Registered Nurse) on 09/23/23 at 1433  Med List Status: <None>   Medication Order Taking? Sig Documenting Provider Last Dose Status Informant  carvedilol  (COREG ) 6.25 MG tablet [id number]  Take 1 tablet (6.25 mg total) by mouth 2 (two) times daily with a meal.  Patient taking differently: Take 6.25 mg by mouth daily.   Lanney Pitts, DO  Expired 09/15/23 2359 Self, Pharmacy Records           Med Note Nanine Babcock, [facility] D   Mon Sep 08, 2023  7:50 AM)    isosorbide  mononitrate (IMDUR ) 30 MG 24 hr tablet [id number]  Take 1 tablet (30 mg total) by mouth daily. Lanney Pitts, DO  Active Self, Pharmacy Records  OLANZapine  zydis (ZYPREXA ) 15 MG disintegrating tablet [id number]  Take 15 mg by mouth at bedtime.  Patient not taking: Reported on 09/03/2023   [provider]  Active Self, Pharmacy Records  rosuvastatin  (CRESTOR ) 5 MG tablet [id number]  Take 1 tablet (5 mg total) by mouth daily.   Active Self, Pharmacy Records           Med Note (CRUTHIS, CHLOE C   Wed Sep 03, 2023  7:18 AM)    sucroferric oxyhydroxide (VELPHORO ) 500 MG chewable tablet [id number]  Chew 3 tablets (1,500 mg total) by mouth 3 (three) times daily with meals.  Patient taking differently: Chew 500 mg by mouth in the morning and at bedtime.   Lanney Pitts, DO  Active Self, Pharmacy Records           Med Note (CRUTHIS, CHLOE C   Wed Sep 03, 2023  7:39 AM) Pt stated he is only taking 500 mg BID.   Med List Note Alline Ivans, CPhT 09/05/23 0865): Dialysis MWF [facility], d/c from local HD clinic due to behaviors. Pt's chart indicates that pt is not currently taking prescribed medications as prescribed by psychiatric staff.             Recommendation:   Specialty  provider follow-up psych 09/26/23 Contact renal navigator, Lauraine Polite, at [phone] following psych appointment to discuss best way to proceed with outpatient dialysis placement  Follow Up Plan:   Telephone follow up appointment date/time:  10/21/23 at 2 PM  Theodora Fish, RN MSN [place]  [facility] RN Care Manager Direct Dial : [phone]  Fax: [phone]

## 2023-09-24 ENCOUNTER — Other Ambulatory Visit: Payer: Self-pay

## 2023-09-24 ENCOUNTER — Emergency Department (HOSPITAL_COMMUNITY)
Admission: EM | Admit: 2023-09-24 | Discharge: 2023-09-24 | Disposition: A | Discharge status: Home / Self Care | Attending: Emergency Medicine | Admitting: Emergency Medicine

## 2023-09-24 DIAGNOSIS — Z992 Dependence on renal dialysis: Secondary | ICD-10-CM | POA: Diagnosis not present

## 2023-09-24 DIAGNOSIS — I132 Hypertensive heart and chronic kidney disease with heart failure and with stage 5 chronic kidney disease, or end stage renal disease: Secondary | ICD-10-CM | POA: Diagnosis not present

## 2023-09-24 DIAGNOSIS — I120 Hypertensive chronic kidney disease with stage 5 chronic kidney disease or end stage renal disease: Secondary | ICD-10-CM | POA: Diagnosis not present

## 2023-09-24 DIAGNOSIS — N186 End stage renal disease: Secondary | ICD-10-CM | POA: Insufficient documentation

## 2023-09-24 DIAGNOSIS — Z79899 Other long term (current) drug therapy: Secondary | ICD-10-CM | POA: Insufficient documentation

## 2023-09-24 DIAGNOSIS — I509 Heart failure, unspecified: Secondary | ICD-10-CM | POA: Insufficient documentation

## 2023-09-24 DIAGNOSIS — Z4902 Encounter for fitting and adjustment of peritoneal dialysis catheter: Secondary | ICD-10-CM | POA: Diagnosis present

## 2023-09-24 LAB — RENAL FUNCTION PANEL
Albumin: 3.3 g/dL — ABNORMAL LOW (ref 3.5–5.0)
Anion gap: 13 (ref 5–15)
BUN: 68 mg/dL — ABNORMAL HIGH (ref 6–20)
CO2: 21 mmol/L — ABNORMAL LOW (ref 22–32)
Calcium: 7.1 mg/dL — ABNORMAL LOW (ref 8.9–10.3)
Chloride: 101 mmol/L (ref 98–111)
Creatinine, Ser: 11.26 mg/dL — ABNORMAL HIGH (ref 0.61–1.24)
GFR, Estimated: 5 mL/min — ABNORMAL LOW
Glucose, Bld: 86 mg/dL (ref 70–99)
Phosphorus: 8.6 mg/dL — ABNORMAL HIGH (ref 2.5–4.6)
Potassium: 4.9 mmol/L (ref 3.5–5.1)
Sodium: 135 mmol/L (ref 135–145)

## 2023-09-24 LAB — CBC
HCT: 22.9 % — ABNORMAL LOW (ref 39.0–52.0)
Hemoglobin: 7.7 g/dL — ABNORMAL LOW (ref 13.0–17.0)
MCH: 34.2 pg — ABNORMAL HIGH (ref 26.0–34.0)
MCHC: 33.6 g/dL (ref 30.0–36.0)
MCV: 101.8 fL — ABNORMAL HIGH (ref 80.0–100.0)
Platelets: 232 K/uL (ref 150–400)
RBC: 2.25 MIL/uL — ABNORMAL LOW (ref 4.22–5.81)
RDW: 15.6 % — ABNORMAL HIGH (ref 11.5–15.5)
WBC: 5.7 K/uL (ref 4.0–10.5)
nRBC: 0 % (ref 0.0–0.2)

## 2023-09-24 MED ORDER — CHLORHEXIDINE GLUCONATE CLOTH 2 % EX PADS: 6.0000 | MEDICATED_PAD | Freq: Every day | CUTANEOUS | Status: DC

## 2023-09-24 NOTE — Progress Notes (Signed)
   09/24/23 1653  Vitals  Temp 98 F (36.7 C)  Pulse Rate 77  Resp 16  BP (!) 151/87  SpO2 100 %  O2 Device Room Air  Weight  (stretcher)  Post Treatment  Dialyzer Clearance Lightly streaked  Hemodialysis Intake (mL) 0 mL  Liters Processed 69.1  Fluid Removed (mL) 2000 mL  Tolerated HD Treatment Yes  AVG/AVF Arterial Site Held (minutes) 10 minutes  AVG/AVF Venous Site Held (minutes) 10 minutes   Received patient in bed to unit.  Alert and oriented.  Informed consent signed and in chart.   TX duration:3.5hrs  Patient tolerated well.  Transported back to the room  Alert, without acute distress.  Hand-off given to patient's nurse.   Access used: RAVF Access issues: none  Total UF removed: 2L Medication(s) given: none    [name]

## 2023-09-24 NOTE — ED Triage Notes (Signed)
 Pt. Here for dialysis. Completed full treatment on Monday.

## 2023-09-24 NOTE — ED Provider Notes (Signed)
  EMERGENCY DEPARTMENT AT [facility] Provider Note   CSN: [id number] Arrival date & time: 09/24/23  7829     History  Chief Complaint  Patient presents with   Vascular Access Problem    Paul [name] is a 58 y.o. male.  Pt is a 58 y.o. male with history of ESRD, CHF, and schizophrenia presents the ED today for dialysis.  Patient received dialysis in the hospital on MWF and last dialysis was 09/22/23, denies missing any recent sessions.  No chest pain or shortness of breath.  No additional complaints or concerns at this time other than minimal SOB but is laying flat right now and states he is comfortable this way.  No cough or congestion.  The history is provided by the patient and medical records.       Home Medications Prior to Admission medications   Medication Sig Start Date End Date Taking? Authorizing Provider  carvedilol  (COREG ) 6.25 MG tablet Take 1 tablet (6.25 mg total) by mouth 2 (two) times daily with a meal. Patient taking differently: Take 6.25 mg by mouth daily. 07/29/23 09/23/23  Tawkaliyar, Roya, DO  isosorbide  mononitrate (IMDUR ) 30 MG 24 hr tablet Take 1 tablet (30 mg total) by mouth daily. 07/29/23   Tawkaliyar, Roya, DO  OLANZapine  zydis (ZYPREXA ) 15 MG disintegrating tablet Take 15 mg by mouth at bedtime. Patient not taking: Reported on 09/03/2023    [provider]  rosuvastatin  (CRESTOR ) 5 MG tablet Take 1 tablet (5 mg total) by mouth daily. 03/24/23     sucroferric oxyhydroxide (VELPHORO ) 500 MG chewable tablet Chew 3 tablets (1,500 mg total) by mouth 3 (three) times daily with meals. Patient taking differently: Chew 500 mg by mouth in the morning and at bedtime. 07/29/23   Tawkaliyar, Roya, DO      Allergies    Penicillin g    Review of Systems   Review of Systems  Physical Exam Updated Vital Signs BP (!) 189/89 (BP Location: Left Arm)   Pulse 82   Temp 98 F (36.7 C)   Resp 16   SpO2 99%  Physical Exam Vitals and  nursing note reviewed.  Constitutional:      General: He is not in acute distress.    Appearance: He is well-developed.  HENT:     Head: Normocephalic and atraumatic.  Eyes:     Conjunctiva/sclera: Conjunctivae normal.     Pupils: Pupils are equal, round, and reactive to light.  Cardiovascular:     Rate and Rhythm: Normal rate and regular rhythm.     Heart sounds: No murmur heard. Pulmonary:     Effort: Pulmonary effort is normal. No respiratory distress.     Breath sounds: Normal breath sounds. No wheezing or rales.  Musculoskeletal:        General: No tenderness. Normal range of motion.     Cervical back: Normal range of motion and neck supple.  Skin:    General: Skin is warm and dry.     Findings: No erythema or rash.  Neurological:     Mental Status: He is alert and oriented to person, place, and time. Mental status is at baseline.  Psychiatric:        Behavior: Behavior normal.     ED Results / Procedures / Treatments   Labs (all labs ordered are listed, but only abnormal results are displayed) Labs Reviewed  RENAL FUNCTION PANEL  CBC    EKG None  Radiology No results found.  Procedures Procedures    Medications Ordered in ED Medications  Chlorhexidine  Gluconate Cloth 2 % PADS 6 each (has no administration in time range)    ED Course/ Medical Decision Making/ A&P                                 Medical Decision Making  Pt here for his regular scheduled dialysis.  No other issues at this time.  Hypertensive but o/w VS are stable.  Consulted Dr. Yvonnie Heritage with nephrology who has put him on the list for dialysis.        Final Clinical Impression(s) / ED Diagnoses Final diagnoses:  None    Rx / DC Orders ED Discharge Orders     None         Almond Army, MD 09/24/23 1521

## 2023-09-24 NOTE — ED Notes (Signed)
 Pt arrived to [facility]16 via transporter from Dialysis. Pt immediately left stating "I'm just going to go, I've been here long enough."

## 2023-09-26 ENCOUNTER — Other Ambulatory Visit (HOSPITAL_COMMUNITY): Payer: Self-pay

## 2023-09-26 ENCOUNTER — Encounter (HOSPITAL_COMMUNITY): Payer: Self-pay | Admitting: Psychiatry

## 2023-09-26 ENCOUNTER — Emergency Department (HOSPITAL_COMMUNITY): Admission: EM | Admit: 2023-09-26 | Discharge: 2023-09-26 | Disposition: A | Discharge status: Home / Self Care

## 2023-09-26 ENCOUNTER — Encounter (HOSPITAL_COMMUNITY): Payer: Self-pay

## 2023-09-26 ENCOUNTER — Telehealth: Payer: Self-pay | Admitting: *Deleted

## 2023-09-26 ENCOUNTER — Ambulatory Visit (HOSPITAL_BASED_OUTPATIENT_CLINIC_OR_DEPARTMENT_OTHER): Admitting: Psychiatry

## 2023-09-26 ENCOUNTER — Other Ambulatory Visit: Payer: Self-pay | Admitting: Student

## 2023-09-26 ENCOUNTER — Other Ambulatory Visit: Payer: Self-pay

## 2023-09-26 VITALS — BP 168/89 | HR 72 | Ht 63.0 in | Wt 180.6 lb

## 2023-09-26 DIAGNOSIS — I120 Hypertensive chronic kidney disease with stage 5 chronic kidney disease or end stage renal disease: Secondary | ICD-10-CM | POA: Diagnosis not present

## 2023-09-26 DIAGNOSIS — Z992 Dependence on renal dialysis: Secondary | ICD-10-CM | POA: Insufficient documentation

## 2023-09-26 DIAGNOSIS — F250 Schizoaffective disorder, bipolar type: Secondary | ICD-10-CM | POA: Diagnosis not present

## 2023-09-26 DIAGNOSIS — N186 End stage renal disease: Secondary | ICD-10-CM | POA: Insufficient documentation

## 2023-09-26 LAB — BASIC METABOLIC PANEL WITH GFR
Anion gap: 14 (ref 5–15)
BUN: 69 mg/dL — ABNORMAL HIGH (ref 6–20)
CO2: 23 mmol/L (ref 22–32)
Calcium: 7.5 mg/dL — ABNORMAL LOW (ref 8.9–10.3)
Chloride: 100 mmol/L (ref 98–111)
Creatinine, Ser: 10.5 mg/dL — ABNORMAL HIGH (ref 0.61–1.24)
GFR, Estimated: 5 mL/min — ABNORMAL LOW (ref >60)
Glucose, Bld: 93 mg/dL (ref 70–99)
Potassium: 4.8 mmol/L (ref 3.5–5.1)
Sodium: 137 mmol/L (ref 135–145)

## 2023-09-26 LAB — CBC
HCT: 24.1 % — ABNORMAL LOW (ref 39.0–52.0)
Hemoglobin: 8 g/dL — ABNORMAL LOW (ref 13.0–17.0)
MCH: 34 pg (ref 26.0–34.0)
MCHC: 33.2 g/dL (ref 30.0–36.0)
MCV: 102.6 fL — ABNORMAL HIGH (ref 80.0–100.0)
Platelets: 228 10*3/uL (ref 150–400)
RBC: 2.35 MIL/uL — ABNORMAL LOW (ref 4.22–5.81)
RDW: 15.4 % (ref 11.5–15.5)
WBC: 6.3 10*3/uL (ref 4.0–10.5)
nRBC: 0.3 % — ABNORMAL HIGH (ref 0.0–0.2)

## 2023-09-26 MED ORDER — CHLORHEXIDINE GLUCONATE CLOTH 2 % EX PADS: 6.0000 | MEDICATED_PAD | Freq: Every day | CUTANEOUS | Status: DC

## 2023-09-26 MED ORDER — ARIPIPRAZOLE 5 MG PO TABS
5.0000 mg | ORAL_TABLET | Freq: Every day | ORAL | 3 refills | Status: DC
  Filled 2023-09-26: qty 30, 30d supply, fill #0

## 2023-09-26 NOTE — ED Notes (Signed)
 Dialysis aware patient is here in the ED.

## 2023-09-26 NOTE — ED Notes (Signed)
 Patient left states it was taking to long for dialysis , states he would come back on Monday, dialysis aware

## 2023-09-26 NOTE — Telephone Encounter (Signed)
 NOT [facility] PATIENT
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
## 2023-09-26 NOTE — ED Triage Notes (Signed)
 Pt here for dialysis and last had tx Wednesday. No other complaints at this time.

## 2023-09-26 NOTE — Progress Notes (Signed)
 Late Note Entry- Sep 26, 2023  Pt had an appt to meet with psychiatrist today. Navigator received a call from local psychiatrist who requested that navigator return his call. Navigator returned call and was advised that pt did attend appt today and that pt requested that psychiatrist speak with navigator. Psychiatrist stated pt was calm and appropriate for appt. Provider did prescribe medication to pt and did request to see pt back in 3-4 weeks. Contacted pt via phone. Pt confirms he went to appt and it went ok. Pt confirms that he was prescribed medication by psychiatrist. Pt states that he plans to get med filled at local pharmacy and have it mailed to his home since pt does not drive. Pt confirms that he has a f/u appt with provider in 3-4 weeks. Pt's case discussed very briefly with local nephrologist. Navigator inquired if pt is receiving phychiatric care and taking medications as prescribed, would/could pt be re-considered as a pt at a local HD clinic. Navigator advised that is unknown at this time. Will assist as needed.   Lauraine Polite Renal Navigator [phone]

## 2023-09-26 NOTE — ED Provider Notes (Signed)
 [facility] EM[facility]GENCY DEPARTMENT AT [facility] Provider Note   CSN: [id number] Arrival date & time: 09/26/23  1102     History  Chief Complaint  Patient presents with   Dialysis     Paul [name] is a 58 y.o. male.  58 year old male with past medical history of schizoaffective disorder and end-stage renal disease presenting to the emergency department today requesting dialysis.  The patient states his last dialysis was on Wednesday.  He denies any acute complaints.        Home Medications Prior to Admission medications   Medication Sig Start Date End Date Taking? Authorizing Provider  ARIPiprazole (ABILIFY) 5 MG tablet Take 1 tablet (5 mg total) by mouth at bedtime. 09/26/23   Plovsky, Doroteo Gasmen, MD  carvedilol  (COREG ) 6.25 MG tablet Take 1 tablet (6.25 mg total) by mouth 2 (two) times daily with a meal. Patient taking differently: Take 6.25 mg by mouth daily. 07/29/23 09/23/23  Tawkaliyar, Roya, DO  isosorbide  mononitrate (IMDUR ) 30 MG 24 hr tablet Take 1 tablet (30 mg total) by mouth daily. 07/29/23   Tawkaliyar, Roya, DO  rosuvastatin  (CRESTOR ) 5 MG tablet Take 1 tablet (5 mg total) by mouth daily. 03/24/23     sucroferric oxyhydroxide (VELPHORO ) 500 MG chewable tablet Chew 3 tablets (1,500 mg total) by mouth 3 (three) times daily with meals. Patient taking differently: Chew 500 mg by mouth in the morning and at bedtime. 07/29/23   Tawkaliyar, Roya, DO      Allergies    Penicillin g    Review of Systems   Review of Systems  All other systems reviewed and are negative.   Physical Exam Updated Vital Signs BP (!) 160/95 (BP Location: Left Arm)   Pulse 70   Temp (!) 97.5 F (36.4 C)   Resp 16   Ht 5\' 3"  (1.6 m)   Wt 81.6 kg   SpO2 100%   BMI 31.89 kg/m  Physical Exam Vitals and nursing note reviewed.   Gen: NAD, chronically ill-appearing HEENT: no oropharyngeal swelling Neck: trachea midline Resp: clear to auscultation bilaterally Card: RRR, no  murmurs, rubs, or gallops Extremities: no calf tenderness, no edema Skin: no rashes Psyc: acting appropriately   ED Results / Procedures / Treatments   Labs (all labs ordered are listed, but only abnormal results are displayed) Labs Reviewed  BASIC METABOLIC PANEL WITH GFR - Abnormal; Notable for the following components:      Result Value   BUN 69 (*)    Creatinine, Ser 10.50 (*)    Calcium  7.5 (*)    GFR, Estimated 5 (*)    All other components within normal limits  CBC - Abnormal; Notable for the following components:   RBC 2.35 (*)    Hemoglobin 8.0 (*)    HCT 24.1 (*)    MCV 102.6 (*)    nRBC 0.3 (*)    All other components within normal limits  HEPATITIS B SURFACE ANTIGEN  HEPATITIS B SURFACE ANTIBODY, QUANTITATIVE    EKG None  Radiology No results found.  Procedures Procedures    Medications Ordered in ED Medications  Chlorhexidine  Gluconate Cloth 2 % PADS 6 each (has no administration in time range)    ED Course/ Medical Decision Making/ A&P                                 Medical Decision Making 58 year old male with past  medical history of schizoaffective disorder and end-stage renal disease presenting to the emergency department today requesting dialysis.  His case is discussed with nephrology.  Will take the patient here for dialysis today.  Will discharge after dialysis.  The patient eloped prior to dialysis.  Amount and/or Complexity of Data Reviewed Labs: ordered.           Final Clinical Impression(s) / ED Diagnoses Final diagnoses:  End stage renal disease (HCC)    Rx / DC Orders ED Discharge Orders     None         Carin Charleston, MD 09/26/23 1413

## 2023-09-26 NOTE — Progress Notes (Signed)
 Complex Care Management Care Guide Note  09/26/2023 Name: Paul [name] MRN: [id number] DOB: 08-05-1965  Paul [name] is a 58 y.o. year old male who is a primary care patient of Lawrance Presume, MD and is actively engaged with the care management team. I reached out to Paul [name] by phone today to assist with scheduling  with the BSW. Patient declined.   Follow up plan: None  Patient stated he is poor but does not need call at this time for resources.    Barnie Bora  [facility]  [facility], [facility] Guide  Direct Dial : [phone]  Fax [phone]

## 2023-09-26 NOTE — ED Notes (Signed)
 Patient  only here for dialysis no complaints.

## 2023-09-29 ENCOUNTER — Emergency Department (HOSPITAL_COMMUNITY)
Admission: EM | Admit: 2023-09-29 | Discharge: 2023-09-30 | Disposition: A | Discharge status: Home / Self Care | Attending: Emergency Medicine | Admitting: Emergency Medicine

## 2023-09-29 ENCOUNTER — Encounter (HOSPITAL_COMMUNITY): Payer: Self-pay | Admitting: *Deleted

## 2023-09-29 ENCOUNTER — Other Ambulatory Visit: Payer: Self-pay

## 2023-09-29 DIAGNOSIS — N186 End stage renal disease: Secondary | ICD-10-CM | POA: Insufficient documentation

## 2023-09-29 DIAGNOSIS — Z992 Dependence on renal dialysis: Secondary | ICD-10-CM | POA: Insufficient documentation

## 2023-09-29 DIAGNOSIS — E875 Hyperkalemia: Secondary | ICD-10-CM | POA: Diagnosis not present

## 2023-09-29 DIAGNOSIS — D631 Anemia in chronic kidney disease: Secondary | ICD-10-CM | POA: Insufficient documentation

## 2023-09-29 DIAGNOSIS — I509 Heart failure, unspecified: Secondary | ICD-10-CM | POA: Insufficient documentation

## 2023-09-29 DIAGNOSIS — I120 Hypertensive chronic kidney disease with stage 5 chronic kidney disease or end stage renal disease: Secondary | ICD-10-CM | POA: Diagnosis not present

## 2023-09-29 LAB — CBC WITH DIFFERENTIAL/PLATELET
Abs Immature Granulocytes: 0.04 10*3/uL (ref 0.00–0.07)
Basophils Absolute: 0 10*3/uL (ref 0.0–0.1)
Basophils Relative: 0 %
Eosinophils Absolute: 0.1 10*3/uL (ref 0.0–0.5)
Eosinophils Relative: 2 %
HCT: 22.9 % — ABNORMAL LOW (ref 39.0–52.0)
Hemoglobin: 7.7 g/dL — ABNORMAL LOW (ref 13.0–17.0)
Immature Granulocytes: 1 %
Lymphocytes Relative: 12 %
Lymphs Abs: 0.8 10*3/uL (ref 0.7–4.0)
MCH: 35 pg — ABNORMAL HIGH (ref 26.0–34.0)
MCHC: 33.6 g/dL (ref 30.0–36.0)
MCV: 104.1 fL — ABNORMAL HIGH (ref 80.0–100.0)
Monocytes Absolute: 0.6 10*3/uL (ref 0.1–1.0)
Monocytes Relative: 8 %
Neutro Abs: 5.5 10*3/uL (ref 1.7–7.7)
Neutrophils Relative %: 77 %
Platelets: 209 10*3/uL (ref 150–400)
RBC: 2.2 MIL/uL — ABNORMAL LOW (ref 4.22–5.81)
RDW: 15.5 % (ref 11.5–15.5)
WBC: 7 10*3/uL (ref 4.0–10.5)
nRBC: 0 % (ref 0.0–0.2)

## 2023-09-29 LAB — BASIC METABOLIC PANEL WITH GFR
Anion gap: 20 — ABNORMAL HIGH (ref 5–15)
BUN: 117 mg/dL — ABNORMAL HIGH (ref 6–20)
CO2: 14 mmol/L — ABNORMAL LOW (ref 22–32)
Calcium: 6.9 mg/dL — ABNORMAL LOW (ref 8.9–10.3)
Chloride: 101 mmol/L (ref 98–111)
Creatinine, Ser: 15.8 mg/dL — ABNORMAL HIGH (ref 0.61–1.24)
GFR, Estimated: 3 mL/min — ABNORMAL LOW (ref >60)
Glucose, Bld: 102 mg/dL — ABNORMAL HIGH (ref 70–99)
Potassium: 5.3 mmol/L — ABNORMAL HIGH (ref 3.5–5.1)
Sodium: 135 mmol/L (ref 135–145)

## 2023-09-29 MED ORDER — HEPARIN SODIUM (PORCINE) 1000 UNIT/ML DIALYSIS: 1000.0000 [IU] | INTRAMUSCULAR | Status: DC | PRN

## 2023-09-29 MED ORDER — CHLORHEXIDINE GLUCONATE CLOTH 2 % EX PADS: 6.0000 | MEDICATED_PAD | Freq: Every day | CUTANEOUS | Status: DC

## 2023-09-29 MED ORDER — HEPARIN SODIUM (PORCINE) 1000 UNIT/ML IJ SOLN
INTRAMUSCULAR | Status: AC
  Filled 2023-09-29: qty 3

## 2023-09-29 MED ORDER — HEPARIN SODIUM (PORCINE) 1000 UNIT/ML DIALYSIS
2400.0000 [IU] | Freq: Once | INTRAMUSCULAR | Status: AC
  Administered 2023-09-29: 2400 [IU] via INTRAVENOUS_CENTRAL

## 2023-09-29 NOTE — ED Notes (Signed)
 Pt transported to dialysis

## 2023-09-29 NOTE — ED Notes (Signed)
 Dialysis unit called and aware patient is here.

## 2023-09-29 NOTE — ED Provider Notes (Signed)
 Emergency Department Provider Note   I have reviewed the triage vital signs and the nursing notes.   HISTORY  Chief Complaint Vascular Access Problem   HPI Paul [name] is a 58 y.o. male CHF, ESRD, and Schizophrenia presents to the emergency department for routine dialysis this morning.  He regularly gets his hemodialysis from the emergency department his last dialysis session was Wednesday of last week.  He skipped Friday due to [facility] wait times.  He is having no symptoms currently.   Past Medical History:  Diagnosis Date   Bipolar disorder (HCC)    Central retinal artery occlusion    with macular infarction of the right eye. status post posterior vitrectomy and photocoagulation with insertion of a shunt in 2006.   CHF (congestive heart failure) (HCC)    chronic mixed syst/diast. 02/2009 echo: Systolic function was mildly reduced. The estimated ejection fraction was in 45%, global HK, Grade I Diast dysfxn.   Chronic kidney disease    ESRD   Heart failure    Obesity    Osteoarthritis    s/p right hip replacement in 2001   Schizophrenia (HCC)    Severe uncontrolled hypertension     Review of Systems  Constitutional: No fever/chills Cardiovascular: Denies chest pain. Respiratory: Denies shortness of breath. Gastrointestinal: No abdominal pain.  No nausea, no vomiting.  Neurological: Negative for headache.  ____________________________________________   PHYSICAL EXAM:  VITAL SIGNS: ED Triage Vitals  Encounter Vitals Group     BP 09/29/23 0704 (!) 191/108     Pulse Rate 09/29/23 0704 86     Resp 09/29/23 0704 18     Temp 09/29/23 0704 97.6 F (36.4 C)     Temp src --      SpO2 09/29/23 0704 100 %     Weight 09/29/23 0704 179 lb 14.3 oz (81.6 kg)     Height 09/29/23 0704 5\' 3"  (1.6 m)   Constitutional: Alert and oriented. Well appearing and in no acute distress. Eyes: Conjunctivae are normal.  Head: Atraumatic. Nose: No  congestion/rhinnorhea. Mouth/Throat: Mucous membranes are moist.  Neck: No stridor.   Cardiovascular: Good peripheral circulation.  Respiratory: Normal respiratory effort.  Gastrointestinal:  No distention.  Musculoskeletal: No gross deformities of extremities. Neurologic:  Normal speech and language. Skin:  Skin is warm, dry and intact. No rash noted.  ____________________________________________   LABS (all labs ordered are listed, but only abnormal results are displayed)  Labs Reviewed  BASIC METABOLIC PANEL WITH GFR - Abnormal; Notable for the following components:      Result Value   Potassium 5.3 (*)    CO2 14 (*)    Glucose, Bld 102 (*)    BUN 117 (*)    Creatinine, Ser 15.80 (*)    Calcium  6.9 (*)    GFR, Estimated 3 (*)    Anion gap 20 (*)    All other components within normal limits  CBC WITH DIFFERENTIAL/PLATELET - Abnormal; Notable for the following components:   RBC 2.20 (*)    Hemoglobin 7.7 (*)    HCT 22.9 (*)    MCV 104.1 (*)    MCH 35.0 (*)    All other components within normal limits    ____________________________________________   PROCEDURES  Procedure(s) performed:   Procedures  None  ____________________________________________   INITIAL IMPRESSION / ASSESSMENT AND PLAN / ED COURSE  Pertinent labs & imaging results that were available during my care of the patient were reviewed by me and  considered in my medical decision making (see chart for details).   This patient is Presenting for Evaluation of HD, which does require a range of treatment options, and is a complaint that involves a high risk of morbidity and mortality.  The Differential Diagnoses include routine HD, volume overload, hyperkalemia, etc.   Clinical Laboratory Tests Ordered, included basic metabolic panel with minimally elevated potassium at 5.3.  Mild anemia at 7.7 similar to baseline values.   Medical Decision Making: Summary:  Patient presents to the ED for routine  HD from the ED. Dr. Zana Hesselbach with Nephrology consulted who will coordinate HD from the ED.   Patient's presentation is most consistent with acute presentation with potential threat to life or bodily function.   Disposition: HD unit  ____________________________________________  FINAL CLINICAL IMPRESSION(S) / ED DIAGNOSES  Final diagnoses:  ESRD (end stage renal disease) (HCC)      Note:  This document was prepared using Dragon voice recognition software and may include unintentional dictation errors.  Abby Hocking, MD, [facility] Emergency Medicine    Darya Bigler, Shereen Dike, MD 09/29/23 0900

## 2023-09-29 NOTE — ED Triage Notes (Signed)
 Patient is here for his normal dialysis M-W-F no other complaints.

## 2023-09-29 NOTE — Progress Notes (Signed)
 Asked to see this patient for hospital dialysis. The plan will be for "ED HD". Pt is not to be admitted at this time. Pt will go to the dialysis unit when they are ready for the patient. When dialysis is completed pt will be sent back to ED for reassessment.   Last OP HD unit info --> just released 07/2023 from [facility] 4[facility]  B400  76.5kg  2K bath    RUA AVF  Heparin  2400    Clinical issues other than volume: 1) Anemia of esrd:  - pt dose not have a nephrologist or HD unit so does not have access to ESA or IV iron  other than when here for hospital HD sessions - recent Hb's have been in the 7- 8 range - last 3 doses of esa were: - darbe 40 mcg 09/03/23 - darbe 40 mcg 09/10/23 - darbe 100 mcg 09/22/23 - last tsat 24% and ferritin 1828 on 09/05/23   Plan - esa this week - consider IV fe   I was present at the procedure, reviewed the HD regimen and made appropriate changes.   Larry Poag MD  [facility] 09/15/2023, 11:11 AM

## 2023-09-30 NOTE — Progress Notes (Signed)
 HD Note:  Some information was entered later than the data was gathered due to patient care needs. The stated time with the data is accurate.  Received patient in bed to unit.   Alert and oriented.   Informed consent signed and in chart.   Access used: Right AVF Access issues: None  Patient tolerated treatment well.   TX duration: 3 hours   Alert, without acute distress.  VSS.   Total UF removed: 3L  Hand-off given to patient's nurse.   Transported back to the ED.   Lavance Beazer RN [facility].

## 2023-09-30 NOTE — ED Provider Notes (Signed)
  2:37 AM Returned from dialysis.  No acute complaints.  States he is feeling well.  He was given coffee at his request.  Stable for discharge.  States he will take the local bus.   Coretha Dew, PA-C 09/30/23 1308    Alissa April, MD 09/30/23 [phone]

## 2023-10-01 ENCOUNTER — Other Ambulatory Visit: Payer: Self-pay

## 2023-10-01 ENCOUNTER — Encounter (HOSPITAL_COMMUNITY): Payer: Self-pay | Admitting: Emergency Medicine

## 2023-10-01 ENCOUNTER — Emergency Department (HOSPITAL_COMMUNITY)
Admission: EM | Admit: 2023-10-01 | Discharge: 2023-10-01 | Disposition: A | Discharge status: Home / Self Care | Attending: Emergency Medicine | Admitting: Emergency Medicine

## 2023-10-01 DIAGNOSIS — Z992 Dependence on renal dialysis: Secondary | ICD-10-CM | POA: Diagnosis not present

## 2023-10-01 DIAGNOSIS — I120 Hypertensive chronic kidney disease with stage 5 chronic kidney disease or end stage renal disease: Secondary | ICD-10-CM | POA: Diagnosis not present

## 2023-10-01 DIAGNOSIS — N179 Acute kidney failure, unspecified: Secondary | ICD-10-CM | POA: Insufficient documentation

## 2023-10-01 DIAGNOSIS — N186 End stage renal disease: Secondary | ICD-10-CM | POA: Diagnosis not present

## 2023-10-01 LAB — CBC WITH DIFFERENTIAL/PLATELET
Abs Immature Granulocytes: 0.03 10*3/uL (ref 0.00–0.07)
Basophils Absolute: 0 10*3/uL (ref 0.0–0.1)
Basophils Relative: 1 %
Eosinophils Absolute: 0.1 10*3/uL (ref 0.0–0.5)
Eosinophils Relative: 2 %
HCT: 27.5 % — ABNORMAL LOW (ref 39.0–52.0)
Hemoglobin: 9.3 g/dL — ABNORMAL LOW (ref 13.0–17.0)
Immature Granulocytes: 1 %
Lymphocytes Relative: 11 %
Lymphs Abs: 0.7 10*3/uL (ref 0.7–4.0)
MCH: 34.2 pg — ABNORMAL HIGH (ref 26.0–34.0)
MCHC: 33.8 g/dL (ref 30.0–36.0)
MCV: 101.1 fL — ABNORMAL HIGH (ref 80.0–100.0)
Monocytes Absolute: 0.6 10*3/uL (ref 0.1–1.0)
Monocytes Relative: 10 %
Neutro Abs: 4.5 10*3/uL (ref 1.7–7.7)
Neutrophils Relative %: 75 %
Platelets: 243 10*3/uL (ref 150–400)
RBC: 2.72 MIL/uL — ABNORMAL LOW (ref 4.22–5.81)
RDW: 15.6 % — ABNORMAL HIGH (ref 11.5–15.5)
WBC: 6 10*3/uL (ref 4.0–10.5)
nRBC: 0 % (ref 0.0–0.2)

## 2023-10-01 LAB — BASIC METABOLIC PANEL WITH GFR
Anion gap: 14 (ref 5–15)
BUN: 79 mg/dL — ABNORMAL HIGH (ref 6–20)
CO2: 23 mmol/L (ref 22–32)
Calcium: 7.3 mg/dL — ABNORMAL LOW (ref 8.9–10.3)
Chloride: 101 mmol/L (ref 98–111)
Creatinine, Ser: 12.41 mg/dL — ABNORMAL HIGH (ref 0.61–1.24)
GFR, Estimated: 4 mL/min — ABNORMAL LOW (ref >60)
Glucose, Bld: 108 mg/dL — ABNORMAL HIGH (ref 70–99)
Potassium: 4.2 mmol/L (ref 3.5–5.1)
Sodium: 138 mmol/L (ref 135–145)

## 2023-10-01 LAB — GLUCOSE, CAPILLARY: Glucose-Capillary: 130 mg/dL — ABNORMAL HIGH (ref 70–99)

## 2023-10-01 MED ORDER — DARBEPOETIN ALFA 100 MCG/0.5ML IJ SOSY
100.0000 ug | PREFILLED_SYRINGE | Freq: Once | INTRAMUSCULAR | Status: DC
  Filled 2023-10-01: qty 0.5

## 2023-10-01 MED ORDER — CHLORHEXIDINE GLUCONATE CLOTH 2 % EX PADS: 6.0000 | MEDICATED_PAD | Freq: Every day | CUTANEOUS | Status: DC

## 2023-10-01 NOTE — ED Notes (Signed)
Pt transported to hemodialysis  

## 2023-10-01 NOTE — ED Notes (Signed)
 Pt in room resting. Blankets provided.

## 2023-10-01 NOTE — ED Notes (Signed)
 Pt resting with eyes closed; respirations spontaneous, even, unlabored

## 2023-10-01 NOTE — ED Notes (Signed)
 Patient verbalizes understanding of discharge instructions. Opportunity for questioning and answers were provided. Pt discharged from ED.

## 2023-10-01 NOTE — ED Triage Notes (Signed)
 Pt states that he is here for dialysis, endorses N/V. Last treatment was 5/26.

## 2023-10-01 NOTE — Discharge Instructions (Addendum)
 Return as planned for dialysis

## 2023-10-01 NOTE — ED Notes (Addendum)
 Report called to dialysis.

## 2023-10-01 NOTE — Significant Event (Addendum)
 Rapid Response Event Note   Reason for Call :  Brief loss of consciousness  Patient had 40 minutes left in his hemodialysis treatment when he had brief loss of consciousness, lasting less than a minute. He was noted to have a full body twitch and brief change in his BP (82/64 from 109/68). BP returned to baseline with aborting treatment.   Initial Focused Assessment:  Pt in bed, AO. Warm, diffuse diaphoresis, pink. Heart rate regular, pulse 2+. No distress. Endorses some improving lightheadedness. No dizziness, denies pain. Clear breath sounds. Equal grips, equal plantar flexion. EOMI, left eye tested. Following commands.   Pt does not remember passing out, however he remembers conversation he had with HD RN just minutes prior.   VS: BP 161/85, HR 53, RR 17, SpO2 100% on room air CBG: 130  Interventions:  -CBG  Plan of Care:     Event Summary:  MD Notified: per HD RN Call Time: 1305 Arrival Time: 1310 End Time: 1325  Washington Hacker, RN

## 2023-10-01 NOTE — Procedures (Addendum)
 Asked to see this patient for hospital dialysis. The plan will be for "ED HD". Pt is not to be admitted at this time. Pt will go to the dialysis unit when they are ready for the patient. When dialysis is completed pt will be sent back to ED for reassessment.    Last OP HD unit info --> just released 07/2023 from [facility]  B400  76.5kg  2K bath    RUA AVF  Heparin  2400    Clinical issues other than volume: 1) Anemia of esrd:  - pt dose not have a nephrologist or HD unit so does not have access to ESA or IV iron  other than when here for hospital HD sessions - recent Hb in the 7- 8 range - last 3 doses of esa were: - darbe 40 mcg 09/03/23 - darbe 40 mcg 09/10/23 - darbe 100 mcg 09/22/23 - last tsat 24% and ferritin 1828 on 09/05/23   Plan - HD upstairs  - ordered darbe 100 mcg sq today - can't give IV fe due to ^^ferritin

## 2023-10-01 NOTE — ED Provider Notes (Addendum)
 [place] EMERGENCY DEPARTMENT AT [facility] Provider Note   CSN: [id number] Arrival date & time: 10/01/23  1914     History  Chief Complaint  Patient presents with   Needs Dialysis    Paul [name] is a 58 y.o. male.  Pt here for dialysis.  Pt reports he comes here for dialysis.  He states he has been kicked out of his dialysis center. Pt denies fever or chills.  Pt vomited in lobby.  He reports some nausea.  Pt denies any shortness of breath.  No abdominal pain.   The history is provided by the patient. No language interpreter was used.       Home Medications Prior to Admission medications   Medication Sig Start Date End Date Taking? Authorizing Provider  ARIPiprazole  (ABILIFY ) 5 MG tablet Take 1 tablet (5 mg total) by mouth at bedtime. 09/26/23   Plovsky, Doroteo Gasmen, MD  carvedilol  (COREG ) 6.25 MG tablet Take 1 tablet (6.25 mg total) by mouth 2 (two) times daily with a meal. Patient taking differently: Take 6.25 mg by mouth daily. 07/29/23 09/23/23  Tawkaliyar, Roya, DO  isosorbide  mononitrate (IMDUR ) 30 MG 24 hr tablet Take 1 tablet (30 mg total) by mouth daily. 07/29/23   Tawkaliyar, Roya, DO  rosuvastatin  (CRESTOR ) 5 MG tablet Take 1 tablet (5 mg total) by mouth daily. 03/24/23     sucroferric oxyhydroxide (VELPHORO ) 500 MG chewable tablet Chew 3 tablets (1,500 mg total) by mouth 3 (three) times daily with meals. Patient taking differently: Chew 500 mg by mouth in the morning and at bedtime. 07/29/23   Tawkaliyar, Roya, DO      Allergies    Penicillin g    Review of Systems   Review of Systems  All other systems reviewed and are negative.   Physical Exam Updated Vital Signs BP (!) 159/107 (BP Location: Left Arm)   Pulse 84   Temp 98 F (36.7 C)   Resp 18   Ht 5' 3 (1.6 m)   Wt 80.4 kg   SpO2 100%   BMI 31.40 kg/m  Physical Exam Vitals and nursing note reviewed.  Constitutional:      Appearance: He is well-developed.  HENT:     Head:  Normocephalic.  Cardiovascular:     Rate and Rhythm: Normal rate.  Pulmonary:     Effort: Pulmonary effort is normal.  Abdominal:     General: There is no distension.  Musculoskeletal:        General: Normal range of motion.     Cervical back: Normal range of motion.  Skin:    General: Skin is warm.  Neurological:     General: No focal deficit present.     Mental Status: He is alert and oriented to person, place, and time.     ED Results / Procedures / Treatments   Labs (all labs ordered are listed, but only abnormal results are displayed) Labs Reviewed  CBC WITH DIFFERENTIAL/PLATELET  BASIC METABOLIC PANEL WITH GFR    EKG None  Radiology No results found.  Procedures Procedures    Medications Ordered in ED Medications - No data to display  ED Course/ Medical Decision Making/ A&P                                 Medical Decision Making Pt here for dialysis   Amount and/or Complexity of Data Reviewed Labs: ordered. Decision-making details  documented in ED Course.    Details: Labs ordered reviewed and interpreted         Pt returned from Dialysis.  Pt reports he feels well.  He wants to go home.     Final Clinical Impression(s) / ED Diagnoses Final diagnoses:  Acute renal failure superimposed on chronic kidney disease, on chronic dialysis, unspecified acute renal failure type [facility])    Rx / DC Orders ED Discharge Orders     None         Evelyn Hire 10/01/23 4098    Roberts Ching, MD 10/01/23 0817    Sandi Crosby, PA-C 10/01/23 1435    Long, Joshua G, MD 10/16/23 0005

## 2023-10-02 ENCOUNTER — Other Ambulatory Visit: Payer: Self-pay | Admitting: Internal Medicine

## 2023-10-02 ENCOUNTER — Other Ambulatory Visit (HOSPITAL_COMMUNITY): Payer: Self-pay

## 2023-10-03 ENCOUNTER — Emergency Department (HOSPITAL_COMMUNITY)
Admission: EM | Admit: 2023-10-03 | Discharge: 2023-10-03 | Discharge status: Against Medical Advice | Attending: Emergency Medicine | Admitting: Emergency Medicine

## 2023-10-03 ENCOUNTER — Other Ambulatory Visit: Payer: Self-pay

## 2023-10-03 ENCOUNTER — Encounter (HOSPITAL_COMMUNITY): Payer: Self-pay

## 2023-10-03 DIAGNOSIS — N186 End stage renal disease: Secondary | ICD-10-CM | POA: Insufficient documentation

## 2023-10-03 DIAGNOSIS — I509 Heart failure, unspecified: Secondary | ICD-10-CM | POA: Diagnosis not present

## 2023-10-03 DIAGNOSIS — Z992 Dependence on renal dialysis: Secondary | ICD-10-CM | POA: Diagnosis not present

## 2023-10-03 DIAGNOSIS — I120 Hypertensive chronic kidney disease with stage 5 chronic kidney disease or end stage renal disease: Secondary | ICD-10-CM | POA: Diagnosis not present

## 2023-10-03 MED ORDER — NEPRO/CARBSTEADY PO LIQD: 237.0000 mL | ORAL | Status: DC | PRN

## 2023-10-03 MED ORDER — ALTEPLASE 2 MG IJ SOLR: 2.0000 mg | Freq: Once | INTRAMUSCULAR | Status: DC | PRN

## 2023-10-03 MED ORDER — LIDOCAINE-PRILOCAINE 2.5-2.5 % EX CREA: 1.0000 | TOPICAL_CREAM | CUTANEOUS | Status: DC | PRN

## 2023-10-03 MED ORDER — HEPARIN SODIUM (PORCINE) 1000 UNIT/ML DIALYSIS: 1000.0000 [IU] | INTRAMUSCULAR | Status: DC | PRN

## 2023-10-03 MED ORDER — LIDOCAINE HCL (PF) 1 % IJ SOLN: 5.0000 mL | INTRAMUSCULAR | Status: DC | PRN

## 2023-10-03 MED ORDER — CHLORHEXIDINE GLUCONATE CLOTH 2 % EX PADS: 6.0000 | MEDICATED_PAD | Freq: Every day | CUTANEOUS | Status: DC

## 2023-10-03 MED ORDER — PENTAFLUOROPROP-TETRAFLUOROETH EX AERO
1.0000 | INHALATION_SPRAY | CUTANEOUS | Status: DC | PRN
Start: 2023-10-03 — End: 2023-10-03

## 2023-10-03 MED ORDER — HEPARIN SODIUM (PORCINE) 1000 UNIT/ML DIALYSIS
2400.0000 [IU] | Freq: Once | INTRAMUSCULAR | Status: DC
  Filled 2023-10-03: qty 3

## 2023-10-03 MED ORDER — ANTICOAGULANT SODIUM CITRATE 4% (200MG/5ML) IV SOLN: 5.0000 mL | Status: DC | PRN

## 2023-10-03 NOTE — ED Triage Notes (Signed)
 Pt states that he is here for dialysis, did vomit yesterday but no complaints today. Last treatment was 5/28

## 2023-10-03 NOTE — ED Provider Notes (Signed)
 [place] EMERGENCY DEPARTMENT AT [facility] Provider Note   CSN: [id number] Arrival date & time: 10/03/23  7829     History  No chief complaint on file.   Paul [name] is a 58 y.o. male.  Patient with history of CHF, ESRD, and Schizophrenia presents to the emergency department for his routine dialysis. Was reportedly kicked out of his dialysis center and now gets his hemodialysis through the emergency department. He was last dialyzed 2 days ago. Upon my evaluation he is resting comfortably and is without symptoms or complaints.  The history is provided by the patient. No language interpreter was used.       Home Medications Prior to Admission medications   Medication Sig Start Date End Date Taking? Authorizing Provider  ARIPiprazole (ABILIFY) 5 MG tablet Take 1 tablet (5 mg total) by mouth at bedtime. 09/26/23   Plovsky, Doroteo Gasmen, MD  carvedilol  (COREG ) 6.25 MG tablet Take 1 tablet (6.25 mg total) by mouth 2 (two) times daily with a meal. Patient taking differently: Take 6.25 mg by mouth daily. 07/29/23 09/23/23  Tawkaliyar, Roya, DO  isosorbide  mononitrate (IMDUR ) 30 MG 24 hr tablet Take 1 tablet (30 mg total) by mouth daily. 07/29/23   Tawkaliyar, Roya, DO  rosuvastatin  (CRESTOR ) 5 MG tablet Take 1 tablet (5 mg total) by mouth daily. 03/24/23     sucroferric oxyhydroxide (VELPHORO ) 500 MG chewable tablet Chew 3 tablets (1,500 mg total) by mouth 3 (three) times daily with meals. Patient taking differently: Chew 500 mg by mouth in the morning and at bedtime. 07/29/23   Tawkaliyar, Roya, DO      Allergies    Penicillin g    Review of Systems   Review of Systems  All other systems reviewed and are negative.   Physical Exam Updated Vital Signs BP (!) 170/103 (BP Location: Right Arm)   Pulse 82   Temp 98.4 F (36.9 C)   Resp 16   Ht 5\' 3"  (1.6 m)   Wt 79.8 kg   SpO2 100%   BMI 31.18 kg/m  Physical Exam Vitals and nursing note reviewed.  Constitutional:       General: He is not in acute distress.    Appearance: Normal appearance. He is normal weight. He is not ill-appearing, toxic-appearing or diaphoretic.  HENT:     Head: Normocephalic and atraumatic.  Cardiovascular:     Rate and Rhythm: Normal rate.  Pulmonary:     Effort: Pulmonary effort is normal. No respiratory distress.  Musculoskeletal:        General: Normal range of motion.     Cervical back: Normal range of motion.  Skin:    General: Skin is warm and dry.  Neurological:     General: No focal deficit present.     Mental Status: He is alert.  Psychiatric:        Mood and Affect: Mood normal.        Behavior: Behavior normal.     ED Results / Procedures / Treatments   Labs (all labs ordered are listed, but only abnormal results are displayed) Labs Reviewed - No data to display  EKG None  Radiology No results found.  Procedures Procedures    Medications Ordered in ED Medications  Chlorhexidine  Gluconate Cloth 2 % PADS 6 each (has no administration in time range)  pentafluoroprop-tetrafluoroeth (GEBAUERS) aerosol 1 Application (has no administration in time range)  lidocaine  (PF) (XYLOCAINE ) 1 % injection 5 mL (has no administration in  time range)  lidocaine -prilocaine  (EMLA ) cream 1 Application (has no administration in time range)  feeding supplement (NEPRO CARB STEADY) liquid 237 mL (has no administration in time range)  heparin  injection 1,000 Units (has no administration in time range)  anticoagulant sodium citrate  solution 5 mL (has no administration in time range)  alteplase  (CATHFLO ACTIVASE ) injection 2 mg (has no administration in time range)  heparin  injection 2,400 Units (has no administration in time range)    ED Course/ Medical Decision Making/ A&P                                 Medical Decision Making  Patient presents today for his routine hemodialysis. Was last dialyzed 2 days ago. Has no complaints. Discussed patient with nephrology  Dr. Zana Hesselbach who put patient on schedule for dialysis later today.   Final Clinical Impression(s) / ED Diagnoses Final diagnoses:  ESRD needing dialysis [facility])    Rx / DC Orders ED Discharge Orders     None         Sherra Dk, PA-C 10/03/23 1352    Kingsley, Victoria K, DO 10/03/23 1443

## 2023-10-03 NOTE — ED Notes (Signed)
 Unable to locate pt after multiple attempts at looking. Believe pt left AMA.

## 2023-10-04 ENCOUNTER — Encounter (HOSPITAL_COMMUNITY): Payer: Self-pay

## 2023-10-04 ENCOUNTER — Other Ambulatory Visit: Payer: Self-pay

## 2023-10-04 ENCOUNTER — Emergency Department (HOSPITAL_COMMUNITY)
Admission: EM | Admit: 2023-10-04 | Discharge: 2023-10-04 | Discharge status: Against Medical Advice | Attending: Emergency Medicine | Admitting: Emergency Medicine

## 2023-10-04 DIAGNOSIS — I120 Hypertensive chronic kidney disease with stage 5 chronic kidney disease or end stage renal disease: Secondary | ICD-10-CM | POA: Diagnosis not present

## 2023-10-04 DIAGNOSIS — I132 Hypertensive heart and chronic kidney disease with heart failure and with stage 5 chronic kidney disease, or end stage renal disease: Secondary | ICD-10-CM | POA: Insufficient documentation

## 2023-10-04 DIAGNOSIS — T82838A Hemorrhage of vascular prosthetic devices, implants and grafts, initial encounter: Secondary | ICD-10-CM | POA: Diagnosis not present

## 2023-10-04 DIAGNOSIS — Z992 Dependence on renal dialysis: Secondary | ICD-10-CM | POA: Diagnosis not present

## 2023-10-04 DIAGNOSIS — N186 End stage renal disease: Secondary | ICD-10-CM | POA: Insufficient documentation

## 2023-10-04 DIAGNOSIS — Z79899 Other long term (current) drug therapy: Secondary | ICD-10-CM | POA: Diagnosis not present

## 2023-10-04 DIAGNOSIS — I509 Heart failure, unspecified: Secondary | ICD-10-CM | POA: Insufficient documentation

## 2023-10-04 LAB — CBC WITH DIFFERENTIAL/PLATELET
Abs Immature Granulocytes: 0.03 10*3/uL (ref 0.00–0.07)
Basophils Absolute: 0 10*3/uL (ref 0.0–0.1)
Basophils Relative: 1 %
Eosinophils Absolute: 0.1 10*3/uL (ref 0.0–0.5)
Eosinophils Relative: 2 %
HCT: 25.2 % — ABNORMAL LOW (ref 39.0–52.0)
Hemoglobin: 8.8 g/dL — ABNORMAL LOW (ref 13.0–17.0)
Immature Granulocytes: 1 %
Lymphocytes Relative: 17 %
Lymphs Abs: 1.1 10*3/uL (ref 0.7–4.0)
MCH: 34.6 pg — ABNORMAL HIGH (ref 26.0–34.0)
MCHC: 34.9 g/dL (ref 30.0–36.0)
MCV: 99.2 fL (ref 80.0–100.0)
Monocytes Absolute: 1 10*3/uL (ref 0.1–1.0)
Monocytes Relative: 16 %
Neutro Abs: 4.2 10*3/uL (ref 1.7–7.7)
Neutrophils Relative %: 63 %
Platelets: 228 10*3/uL (ref 150–400)
RBC: 2.54 MIL/uL — ABNORMAL LOW (ref 4.22–5.81)
RDW: 14.8 % (ref 11.5–15.5)
WBC: 6.6 10*3/uL (ref 4.0–10.5)
nRBC: 0 % (ref 0.0–0.2)

## 2023-10-04 LAB — COMPREHENSIVE METABOLIC PANEL WITH GFR
ALT: 10 U/L (ref 0–44)
AST: 12 U/L — ABNORMAL LOW (ref 15–41)
Albumin: 3.6 g/dL (ref 3.5–5.0)
Alkaline Phosphatase: 65 U/L (ref 38–126)
Anion gap: 18 — ABNORMAL HIGH (ref 5–15)
BUN: 79 mg/dL — ABNORMAL HIGH (ref 6–20)
CO2: 24 mmol/L (ref 22–32)
Calcium: 7 mg/dL — ABNORMAL LOW (ref 8.9–10.3)
Chloride: 92 mmol/L — ABNORMAL LOW (ref 98–111)
Creatinine, Ser: 14.06 mg/dL — ABNORMAL HIGH (ref 0.61–1.24)
GFR, Estimated: 4 mL/min — ABNORMAL LOW (ref >60)
Glucose, Bld: 94 mg/dL (ref 70–99)
Potassium: 4 mmol/L (ref 3.5–5.1)
Sodium: 134 mmol/L — ABNORMAL LOW (ref 135–145)
Total Bilirubin: 0.7 mg/dL (ref 0.0–1.2)
Total Protein: 7 g/dL (ref 6.5–8.1)

## 2023-10-04 LAB — HEPATITIS B SURFACE ANTIGEN: Hepatitis B Surface Ag: NONREACTIVE

## 2023-10-04 MED ORDER — LIDOCAINE HCL (PF) 1 % IJ SOLN: 5.0000 mL | INTRAMUSCULAR | Status: DC | PRN

## 2023-10-04 MED ORDER — ALTEPLASE 2 MG IJ SOLR: 2.0000 mg | Freq: Once | INTRAMUSCULAR | Status: DC | PRN

## 2023-10-04 MED ORDER — CHLORHEXIDINE GLUCONATE CLOTH 2 % EX PADS: 6.0000 | MEDICATED_PAD | Freq: Every day | CUTANEOUS | Status: DC

## 2023-10-04 MED ORDER — LIDOCAINE-PRILOCAINE 2.5-2.5 % EX CREA: 1.0000 | TOPICAL_CREAM | CUTANEOUS | Status: DC | PRN

## 2023-10-04 MED ORDER — HEPARIN SODIUM (PORCINE) 1000 UNIT/ML DIALYSIS: 2400.0000 [IU] | Freq: Once | INTRAMUSCULAR | Status: DC

## 2023-10-04 MED ORDER — HEPARIN SODIUM (PORCINE) 1000 UNIT/ML DIALYSIS: 1000.0000 [IU] | INTRAMUSCULAR | Status: DC | PRN

## 2023-10-04 MED ORDER — PENTAFLUOROPROP-TETRAFLUOROETH EX AERO: 1.0000 | INHALATION_SPRAY | CUTANEOUS | Status: DC | PRN

## 2023-10-04 MED ORDER — NEPRO/CARBSTEADY PO LIQD
237.0000 mL | ORAL | Status: DC | PRN
  Filled 2023-10-04: qty 237

## 2023-10-04 MED ORDER — ANTICOAGULANT SODIUM CITRATE 4% (200MG/5ML) IV SOLN
5.0000 mL | Status: DC | PRN
  Filled 2023-10-04: qty 5

## 2023-10-04 NOTE — ED Provider Notes (Signed)
 Patient has returned from dialysis.  He is now yelling at our nurses and demanding to leave.  He is appropriate for discharge.   Afton Horse T, DO 10/04/23 [phone]

## 2023-10-04 NOTE — ED Provider Notes (Signed)
 [place] EMERGENCY DEPARTMENT AT [facility] Provider Note   CSN: [id number] Arrival date & time: 10/04/23  1115     History  Chief Complaint  Patient presents with   Dialysis    Paul [name] is a 58 y.o. male with history of ESRD on HD (M, W, F), CHF, schizophrenia, bipolar presents for dialysis.  Patient came yesterday but ended up leaving due to the wait.  Last completed dialysis session was this past Wednesday.  Patient is asymptomatic.  HPI    Past Medical History:  Diagnosis Date   Bipolar disorder (HCC)    Central retinal artery occlusion    with macular infarction of the right eye. status post posterior vitrectomy and photocoagulation with insertion of a shunt in 2006.   CHF (congestive heart failure) (HCC)    chronic mixed syst/diast. 02/2009 echo: Systolic function was mildly reduced. The estimated ejection fraction was in 45%, global HK, Grade I Diast dysfxn.   Chronic kidney disease    ESRD   Heart failure    Obesity    Osteoarthritis    s/p right hip replacement in 2001   Schizophrenia (HCC)    Severe uncontrolled hypertension      Home Medications Prior to Admission medications   Medication Sig Start Date End Date Taking? Authorizing Provider  ARIPiprazole  (ABILIFY ) 5 MG tablet Take 1 tablet (5 mg total) by mouth at bedtime. 09/26/23   Plovsky, Doroteo Gasmen, MD  carvedilol  (COREG ) 6.25 MG tablet Take 1 tablet (6.25 mg total) by mouth 2 (two) times daily with a meal. Patient taking differently: Take 6.25 mg by mouth daily. 07/29/23 09/23/23  Tawkaliyar, Roya, DO  isosorbide  mononitrate (IMDUR ) 30 MG 24 hr tablet Take 1 tablet (30 mg total) by mouth daily. 07/29/23   Tawkaliyar, Roya, DO  rosuvastatin  (CRESTOR ) 5 MG tablet Take 1 tablet (5 mg total) by mouth daily. 03/24/23     sucroferric oxyhydroxide (VELPHORO ) 500 MG chewable tablet Chew 3 tablets (1,500 mg total) by mouth 3 (three) times daily with meals. Patient taking differently: Chew 500 mg by  mouth in the morning and at bedtime. 07/29/23   Tawkaliyar, Roya, DO      Allergies    Penicillin g    Review of Systems   Review of Systems  All other systems reviewed and are negative.   Physical Exam Updated Vital Signs BP (!) 155/96 (BP Location: Left Arm)   Pulse 73   Temp 97.9 F (36.6 C) (Oral)   Resp 17   SpO2 100%  Physical Exam Vitals and nursing note reviewed.  Constitutional:      General: He is not in acute distress.    Appearance: He is well-developed.  HENT:     Head: Normocephalic and atraumatic.  Eyes:     Conjunctiva/sclera: Conjunctivae normal.  Cardiovascular:     Rate and Rhythm: Normal rate and regular rhythm.     Heart sounds: No murmur heard. Pulmonary:     Effort: Pulmonary effort is normal. No respiratory distress.     Breath sounds: Normal breath sounds.  Abdominal:     Palpations: Abdomen is soft.     Tenderness: There is no abdominal tenderness.  Musculoskeletal:        General: Swelling present.     Cervical back: Neck supple.     Comments: Trace bilateral lower extremity edema  Skin:    General: Skin is warm and dry.     Capillary Refill: Capillary refill takes less  than 2 seconds.  Neurological:     Mental Status: He is alert.  Psychiatric:        Mood and Affect: Mood normal.     ED Results / Procedures / Treatments   Labs (all labs ordered are listed, but only abnormal results are displayed) Labs Reviewed  CBC WITH DIFFERENTIAL/PLATELET  COMPREHENSIVE METABOLIC PANEL WITH GFR    EKG EKG Interpretation Date/Time:  Saturday Oct 04 2023 14:34:00 EDT Ventricular Rate:  76 PR Interval:  210 QRS Duration:  94 QT Interval:  438 QTC Calculation: 492 R Axis:   32  Text Interpretation: Sinus rhythm with 1st degree A-V block T wave abnormality, consider inferolateral ischemia Prolonged QT Abnormal ECG Confirmed by Jerilynn Montenegro [phone]) on 10/04/2023 2:55:27 PM  Radiology No results found.  Procedures Procedures     Medications Ordered in ED Medications  Chlorhexidine  Gluconate Cloth 2 % PADS 6 each (has no administration in time range)  pentafluoroprop-tetrafluoroeth (GEBAUERS) aerosol 1 Application (has no administration in time range)  lidocaine  (PF) (XYLOCAINE ) 1 % injection 5 mL (has no administration in time range)  lidocaine -prilocaine  (EMLA ) cream 1 Application (has no administration in time range)  feeding supplement (NEPRO CARB STEADY) liquid 237 mL (has no administration in time range)  heparin  injection 1,000 Units (has no administration in time range)  anticoagulant sodium citrate  solution 5 mL (has no administration in time range)  alteplase  (CATHFLO ACTIVASE ) injection 2 mg (has no administration in time range)  heparin  injection 2,400 Units (has no administration in time range)    ED Course/ Medical Decision Making/ A&P                                 Medical Decision Making Amount and/or Complexity of Data Reviewed Labs: ordered.   This patient presents to the ED with chief complaint(s) of dialysis.  The complaint involves an extensive differential diagnosis and also carries with it a high risk of complications and morbidity.   Pertinent past medical history as listed in HPI   Additional history obtained: Records reviewed Care Everywhere/External Records  Assessment and management:   Hemodynamically stable, nontoxic-appearing patient presenting for his dialysis.  Patient receives his dialysis Monday, Wednesday and Friday.  His last completed session was this past Wednesday.  Patient is asymptomatic without any complaints.  He has trace bilateral lower extremity edema on exam.  He was here yesterday but did not stay due to the weight.  Labs will be drawn in dialysis.  Independent ECG interpretation:  Sinus rhythm first-degree block  Independent labs interpretation:  The following labs were independently interpreted:  Labs will be drawn and analysis  Independent  visualization and interpretation of imaging: I independently visualized the following imaging with scope of interpretation limited to determining acute life threatening conditions related to emergency care: none    Consultations obtained:   Nephrology Dr. Myron Asters, plan for dialysis today.  Labs will be drawn at dialysis  Disposition:   Patient will undergo dialysis today.  He is asymptomatic and plan for discharge following dialysis. Social Determinants of Health:   none  This note was dictated with voice recognition software.  Despite best efforts at proofreading, errors may have occurred which can change the documentation meaning.          Final Clinical Impression(s) / ED Diagnoses Final diagnoses:  ESRD on dialysis [facility])    Rx / DC Orders ED Discharge Orders  None         Stanton Earthly 10/04/23 1524    Jerilynn Montenegro, MD 10/05/23 1320

## 2023-10-04 NOTE — ED Notes (Signed)
 Pt arrived back to the ED from hemodialysis. Pt immediately starts to get out of the stretcher while it was moving. Pt encouraged to wait and let the EDP see him. Pt starts yelling "what the hell do you people not understand. I'm going home now, I've been in this damn place all day long". Pt refused for this RN to get VS or be seen by a provider. EDP made aware. Pt seen ambulating out of the department

## 2023-10-04 NOTE — ED Notes (Signed)
 Patient transported to Hemodialysis. Signed consent form with patient.

## 2023-10-04 NOTE — ED Triage Notes (Signed)
 Pt here for dialysis, came yesterday but did not receive it due to the wait. Denies any new complaints

## 2023-10-04 NOTE — H&P (View-Only) (Signed)
 Hospital Consult    Reason for Consult: Bleeding right arm AV fistula  MRN #:  [id number]  History of Present Illness: This is a 58 y.o. male with history of ESRD that vascular surgery has been consulted for bleeding from his right arm AV fistula.  Currently in dialysis and bleeding from a needle hole in his right arm fistula.  Pressures been held for over 30 to 40 minutes without stopping.  Apparently this happened about 6 weeks ago.  He has a right brachiocephalic fistula that was placed 07/2022 by Dr. Rosalva Comber  Past Medical History:  Diagnosis Date   Bipolar disorder [facility])    Central retinal artery occlusion    with macular infarction of the right eye. status post posterior vitrectomy and photocoagulation with insertion of a shunt in 2006.   CHF (congestive heart failure) (HCC)    chronic mixed syst/diast. 02/2009 echo: Systolic function was mildly reduced. The estimated ejection fraction was in 45%, global HK, Grade I Diast dysfxn.   Chronic kidney disease    ESRD   Heart failure    Obesity    Osteoarthritis    s/p right hip replacement in 2001   Schizophrenia (HCC)    Severe uncontrolled hypertension     Past Surgical History:  Procedure Laterality Date   AV FISTULA PLACEMENT Right 07/08/2022   Procedure: RIGHT RADIOCEPHALIC ARTERIOVENOUS (AV) FISTULA CREATION;  Surgeon: Kayla Part, MD;  Location: [facility] OR;  Service: Vascular;  Laterality: Right;   IR FLUORO GUIDE CV LINE RIGHT  05/13/2022   IR US  GUIDE VASC ACCESS RIGHT  05/13/2022   JOINT REPLACEMENT     right hip replacement   right eye surgery     for glaucoma   TOTAL HIP ARTHROPLASTY Bilateral 1997, 2001    Allergies  Allergen Reactions   Penicillin G Nausea And Vomiting    Prior to Admission medications   Medication Sig Start Date End Date Taking? Authorizing Provider  ARIPiprazole  (ABILIFY ) 5 MG tablet Take 1 tablet (5 mg total) by mouth at bedtime. 09/26/23   Plovsky, Doroteo Gasmen, MD  carvedilol  (COREG ) 6.25 MG  tablet Take 1 tablet (6.25 mg total) by mouth 2 (two) times daily with a meal. Patient taking differently: Take 6.25 mg by mouth daily. 07/29/23 09/23/23  Tawkaliyar, Roya, DO  isosorbide  mononitrate (IMDUR ) 30 MG 24 hr tablet Take 1 tablet (30 mg total) by mouth daily. 07/29/23   Tawkaliyar, Roya, DO  rosuvastatin  (CRESTOR ) 5 MG tablet Take 1 tablet (5 mg total) by mouth daily. 03/24/23     sucroferric oxyhydroxide (VELPHORO ) 500 MG chewable tablet Chew 3 tablets (1,500 mg total) by mouth 3 (three) times daily with meals. Patient taking differently: Chew 500 mg by mouth in the morning and at bedtime. 07/29/23   Lanney Pitts, DO    Social History   Socioeconomic History   Marital status: Legally Separated    Spouse name: Not on file   Number of children: Not on file   Years of education: Not on file   Highest education level: Not on file  Occupational History   Occupation: machinist  Tobacco Use   Smoking status: Former   Smokeless tobacco: Never   Tobacco comments:    Quit smoking 20-30 years ago, smoked for 1 year.  Vaping Use   Vaping status: Never Used  Substance and Sexual Activity   Alcohol use: No   Drug use: No   Sexual activity: Yes    Birth control/protection: None  Other  Topics Concern   Not on file  Social History Narrative   Not on file   Social Drivers of Health   Financial Resource Strain: High Risk (09/15/2023)   Overall Financial Resource Strain (CARDIA)    Difficulty of Paying Living Expenses: Very hard  Food Insecurity: No Food Insecurity (09/15/2023)   Hunger Vital Sign    Worried About Running Out of Food in the Last Year: Never true    Ran Out of Food in the Last Year: Never true  Transportation Needs: Unmet Transportation Needs (09/15/2023)   PRAPARE - Transportation    Lack of Transportation (Medical): Yes    Lack of Transportation (Non-Medical): Yes  Physical Activity: Not on file  Stress: No Stress Concern Present (09/15/2023)   [employer] of Occupational Health - Occupational Stress Questionnaire    Feeling of Stress : Only a little  Social Connections: Moderately Integrated (07/01/2023)   Social Connection and Isolation Panel [NHANES]    Frequency of Communication with Friends and Family: Three times a week    Frequency of Social Gatherings with Friends and Family: Never    Attends Religious Services: More than 4 times per year    Active Member of [employer] or Organizations: Yes    Attends Engineer, structural: More than 4 times per year    Marital Status: Divorced  Intimate Partner Violence: Not At Risk (08/26/2023)   Humiliation, Afraid, Rape, and Kick questionnaire    Fear of Current or Ex-Partner: No    Emotionally Abused: No    Physically Abused: No    Sexually Abused: No     Family History  Problem Relation Age of Onset   Hypertension Father    Stroke Father     ROS: [x]  Positive   [ ]  Negative   [ ]  All sytems reviewed and are negative  Cardiovascular: []  chest pain/pressure []  palpitations []  SOB lying flat []  DOE []  pain in legs while walking []  pain in legs at rest []  pain in legs at night []  non-healing ulcers []  hx of DVT []  swelling in legs  Pulmonary: []  productive cough []  asthma/wheezing []  home O2  Neurologic: []  weakness in []  arms []  legs []  numbness in []  arms []  legs []  hx of CVA []  mini stroke [] difficulty speaking or slurred speech []  temporary loss of vision in one eye []  dizziness  Hematologic: []  hx of cancer []  bleeding problems []  problems with blood clotting easily  Endocrine:   []  diabetes []  thyroid disease  GI []  vomiting blood []  blood in stool  GU: []  CKD/renal failure []  HD--[]  M/W/F or []  T/T/S []  burning with urination []  blood in urine  Psychiatric: []  anxiety []  depression  Musculoskeletal: []  arthritis []  joint pain  Integumentary: []  rashes []  ulcers  Constitutional: []  fever []  chills   Physical  Examination  Vitals:   10/04/23 1730 10/04/23 1800  BP: (!) 174/82 (!) 171/98  Pulse:    Resp:    Temp:    SpO2:     There is no height or weight on file to calculate BMI.  General:  WDWN in NAD Gait: Not observed HENT: WNL, normocephalic Pulmonary: normal non-labored breathing Cardiac: regular, without  Murmurs, rubs or gallops Abdomen:  soft, NT/ND, no masses Vascular Exam/Pulses: Right brachiocephalic AV fistula pulsatile Musculoskeletal: no muscle wasting or atrophy  Neurologic: A&O X 3; Appropriate Affect ; SENSATION: normal; MOTOR FUNCTION:  moving all extremities equally. Speech is fluent/normal   CBC  Component Value Date/Time   WBC 6.6 10/04/2023 1510   RBC 2.54 (L) 10/04/2023 1510   HGB 8.8 (L) 10/04/2023 1510   HCT 25.2 (L) 10/04/2023 1510   PLT 228 10/04/2023 1510   MCV 99.2 10/04/2023 1510   MCH 34.6 (H) 10/04/2023 1510   MCHC 34.9 10/04/2023 1510   RDW 14.8 10/04/2023 1510   LYMPHSABS 1.1 10/04/2023 1510   MONOABS 1.0 10/04/2023 1510   EOSABS 0.1 10/04/2023 1510   BASOSABS 0.0 10/04/2023 1510    BMET    Component Value Date/Time   NA 134 (L) 10/04/2023 1510   NA 138 06/14/2021 1350   K 4.0 10/04/2023 1510   CL 92 (L) 10/04/2023 1510   CO2 24 10/04/2023 1510   GLUCOSE 94 10/04/2023 1510   BUN 79 (H) 10/04/2023 1510   BUN 52 (H) 06/14/2021 1350   CREATININE 14.06 (H) 10/04/2023 1510   CALCIUM  7.0 (L) 10/04/2023 1510   CALCIUM  5.8 (LL) 05/12/2022 0636   GFRNONAA 4 (L) 10/04/2023 1510   GFRAA 64 (L) 04/20/2013 1915    COAGS: Lab Results  Component Value Date   INR 1.0 07/24/2023   INR 1.1 01/23/2023   INR 1.2 05/11/2022     Non-Invasive Vascular Imaging:    N/A   ASSESSMENT/PLAN: This is a 58 y.o. male with history of ESRD that vascular surgery has been consulted for bleeding from his right arm AV fistula.  Currently in dialysis and bleeding from a needle hole in his right arm fistula.  Pressures had been held for over 30 to 40  minutes without stopping.  Apparently this happened about 6 weeks ago.  He has a right brachiocephalic fistula that was placed 07/2022 by Dr. Rosalva Comber  I placed a 4-0 Monocryl stitch in the needle hole in dialysis with hemostasis.  I have instructed nursing to place gentle Ace wrap on his arm.  I will ask my office to schedule urgent fistulogram early this week given fistula is pulsatile.  Young Hensen, MD [facility] Office: [phone]  Young Hensen

## 2023-10-04 NOTE — Progress Notes (Addendum)
 Received patient in bed to unit. [facility] Alert and oriented. A& O X4 Informed consent signed and in chart. yes  TX duration: 3.25 HOURS  Patient tolerated well. Yes Transported back to the room . ER via stretcher Alert, without acute distress.  Hand-off given to patient's nurse. Tiffany RN  Access used: Right Fistula Access issues:  Arterial site bled excessively after needle was pulled. More than 45 minutes. Nephrology was notified and came to bedside along with surgeon. Three sutures were placed and plans for fistulagram next week prior to next dialysis session were made   Total UF removed: 2000 Medication(s) given: None Post HD weight: 77.9kg Post HD VS: 1176/99, 100% RA, 94P, 28 Resp   Paul [name] S Paul Loper RN, DNP [facility]

## 2023-10-04 NOTE — Consult Note (Signed)
 Hospital Consult    Reason for Consult: Bleeding right arm AV fistula  MRN #:  [id number]  History of Present Illness: This is a 58 y.o. male with history of ESRD that vascular surgery has been consulted for bleeding from his right arm AV fistula.  Currently in dialysis and bleeding from a needle hole in his right arm fistula.  Pressures been held for over 30 to 40 minutes without stopping.  Apparently this happened about 6 weeks ago.  He has a right brachiocephalic fistula that was placed 07/2022 by Dr. Rosalva Comber  Past Medical History:  Diagnosis Date   Bipolar disorder [facility])    Central retinal artery occlusion    with macular infarction of the right eye. status post posterior vitrectomy and photocoagulation with insertion of a shunt in 2006.   CHF (congestive heart failure) (HCC)    chronic mixed syst/diast. 02/2009 echo: Systolic function was mildly reduced. The estimated ejection fraction was in 45%, global HK, Grade I Diast dysfxn.   Chronic kidney disease    ESRD   Heart failure    Obesity    Osteoarthritis    s/p right hip replacement in 2001   Schizophrenia (HCC)    Severe uncontrolled hypertension     Past Surgical History:  Procedure Laterality Date   AV FISTULA PLACEMENT Right 07/08/2022   Procedure: RIGHT RADIOCEPHALIC ARTERIOVENOUS (AV) FISTULA CREATION;  Surgeon: Kayla Part, MD;  Location: [facility] OR;  Service: Vascular;  Laterality: Right;   IR FLUORO GUIDE CV LINE RIGHT  05/13/2022   IR US  GUIDE VASC ACCESS RIGHT  05/13/2022   JOINT REPLACEMENT     right hip replacement   right eye surgery     for glaucoma   TOTAL HIP ARTHROPLASTY Bilateral 1997, 2001    Allergies  Allergen Reactions   Penicillin G Nausea And Vomiting    Prior to Admission medications   Medication Sig Start Date End Date Taking? Authorizing Provider  ARIPiprazole  (ABILIFY ) 5 MG tablet Take 1 tablet (5 mg total) by mouth at bedtime. 09/26/23   Plovsky, Doroteo Gasmen, MD  carvedilol  (COREG ) 6.25 MG  tablet Take 1 tablet (6.25 mg total) by mouth 2 (two) times daily with a meal. Patient taking differently: Take 6.25 mg by mouth daily. 07/29/23 09/23/23  Tawkaliyar, Roya, DO  isosorbide  mononitrate (IMDUR ) 30 MG 24 hr tablet Take 1 tablet (30 mg total) by mouth daily. 07/29/23   Tawkaliyar, Roya, DO  rosuvastatin  (CRESTOR ) 5 MG tablet Take 1 tablet (5 mg total) by mouth daily. 03/24/23     sucroferric oxyhydroxide (VELPHORO ) 500 MG chewable tablet Chew 3 tablets (1,500 mg total) by mouth 3 (three) times daily with meals. Patient taking differently: Chew 500 mg by mouth in the morning and at bedtime. 07/29/23   Lanney Pitts, DO    Social History   Socioeconomic History   Marital status: Legally Separated    Spouse name: Not on file   Number of children: Not on file   Years of education: Not on file   Highest education level: Not on file  Occupational History   Occupation: machinist  Tobacco Use   Smoking status: Former   Smokeless tobacco: Never   Tobacco comments:    Quit smoking 20-30 years ago, smoked for 1 year.  Vaping Use   Vaping status: Never Used  Substance and Sexual Activity   Alcohol use: No   Drug use: No   Sexual activity: Yes    Birth control/protection: None  Other  Topics Concern   Not on file  Social History Narrative   Not on file   Social Drivers of Health   Financial Resource Strain: High Risk (09/15/2023)   Overall Financial Resource Strain (CARDIA)    Difficulty of Paying Living Expenses: Very hard  Food Insecurity: No Food Insecurity (09/15/2023)   Hunger Vital Sign    Worried About Running Out of Food in the Last Year: Never true    Ran Out of Food in the Last Year: Never true  Transportation Needs: Unmet Transportation Needs (09/15/2023)   PRAPARE - Transportation    Lack of Transportation (Medical): Yes    Lack of Transportation (Non-Medical): Yes  Physical Activity: Not on file  Stress: No Stress Concern Present (09/15/2023)   [employer] of Occupational Health - Occupational Stress Questionnaire    Feeling of Stress : Only a little  Social Connections: Moderately Integrated (07/01/2023)   Social Connection and Isolation Panel [NHANES]    Frequency of Communication with Friends and Family: Three times a week    Frequency of Social Gatherings with Friends and Family: Never    Attends Religious Services: More than 4 times per year    Active Member of [employer] or Organizations: Yes    Attends Engineer, structural: More than 4 times per year    Marital Status: Divorced  Intimate Partner Violence: Not At Risk (08/26/2023)   Humiliation, Afraid, Rape, and Kick questionnaire    Fear of Current or Ex-Partner: No    Emotionally Abused: No    Physically Abused: No    Sexually Abused: No     Family History  Problem Relation Age of Onset   Hypertension Father    Stroke Father     ROS: [x]  Positive   [ ]  Negative   [ ]  All sytems reviewed and are negative  Cardiovascular: []  chest pain/pressure []  palpitations []  SOB lying flat []  DOE []  pain in legs while walking []  pain in legs at rest []  pain in legs at night []  non-healing ulcers []  hx of DVT []  swelling in legs  Pulmonary: []  productive cough []  asthma/wheezing []  home O2  Neurologic: []  weakness in []  arms []  legs []  numbness in []  arms []  legs []  hx of CVA []  mini stroke [] difficulty speaking or slurred speech []  temporary loss of vision in one eye []  dizziness  Hematologic: []  hx of cancer []  bleeding problems []  problems with blood clotting easily  Endocrine:   []  diabetes []  thyroid disease  GI []  vomiting blood []  blood in stool  GU: []  CKD/renal failure []  HD--[]  M/W/F or []  T/T/S []  burning with urination []  blood in urine  Psychiatric: []  anxiety []  depression  Musculoskeletal: []  arthritis []  joint pain  Integumentary: []  rashes []  ulcers  Constitutional: []  fever []  chills   Physical  Examination  Vitals:   10/04/23 1730 10/04/23 1800  BP: (!) 174/82 (!) 171/98  Pulse:    Resp:    Temp:    SpO2:     There is no height or weight on file to calculate BMI.  General:  WDWN in NAD Gait: Not observed HENT: WNL, normocephalic Pulmonary: normal non-labored breathing Cardiac: regular, without  Murmurs, rubs or gallops Abdomen:  soft, NT/ND, no masses Vascular Exam/Pulses: Right brachiocephalic AV fistula pulsatile Musculoskeletal: no muscle wasting or atrophy  Neurologic: A&O X 3; Appropriate Affect ; SENSATION: normal; MOTOR FUNCTION:  moving all extremities equally. Speech is fluent/normal   CBC  Component Value Date/Time   WBC 6.6 10/04/2023 1510   RBC 2.54 (L) 10/04/2023 1510   HGB 8.8 (L) 10/04/2023 1510   HCT 25.2 (L) 10/04/2023 1510   PLT 228 10/04/2023 1510   MCV 99.2 10/04/2023 1510   MCH 34.6 (H) 10/04/2023 1510   MCHC 34.9 10/04/2023 1510   RDW 14.8 10/04/2023 1510   LYMPHSABS 1.1 10/04/2023 1510   MONOABS 1.0 10/04/2023 1510   EOSABS 0.1 10/04/2023 1510   BASOSABS 0.0 10/04/2023 1510    BMET    Component Value Date/Time   NA 134 (L) 10/04/2023 1510   NA 138 06/14/2021 1350   K 4.0 10/04/2023 1510   CL 92 (L) 10/04/2023 1510   CO2 24 10/04/2023 1510   GLUCOSE 94 10/04/2023 1510   BUN 79 (H) 10/04/2023 1510   BUN 52 (H) 06/14/2021 1350   CREATININE 14.06 (H) 10/04/2023 1510   CALCIUM  7.0 (L) 10/04/2023 1510   CALCIUM  5.8 (LL) 05/12/2022 0636   GFRNONAA 4 (L) 10/04/2023 1510   GFRAA 64 (L) 04/20/2013 1915    COAGS: Lab Results  Component Value Date   INR 1.0 07/24/2023   INR 1.1 01/23/2023   INR 1.2 05/11/2022     Non-Invasive Vascular Imaging:    N/A   ASSESSMENT/PLAN: This is a 58 y.o. male with history of ESRD that vascular surgery has been consulted for bleeding from his right arm AV fistula.  Currently in dialysis and bleeding from a needle hole in his right arm fistula.  Pressures had been held for over 30 to 40  minutes without stopping.  Apparently this happened about 6 weeks ago.  He has a right brachiocephalic fistula that was placed 07/2022 by Dr. Rosalva Comber  I placed a 4-0 Monocryl stitch in the needle hole in dialysis with hemostasis.  I have instructed nursing to place gentle Ace wrap on his arm.  I will ask my office to schedule urgent fistulogram early this week given fistula is pulsatile.  Young Hensen, MD [facility] Office: [phone]  Young Hensen

## 2023-10-04 NOTE — ED Notes (Signed)
 Two attempts made to collect labs.

## 2023-10-06 ENCOUNTER — Other Ambulatory Visit (HOSPITAL_COMMUNITY): Payer: Self-pay

## 2023-10-08 ENCOUNTER — Encounter (HOSPITAL_COMMUNITY): Payer: Self-pay | Admitting: Emergency Medicine

## 2023-10-08 ENCOUNTER — Emergency Department (HOSPITAL_COMMUNITY)

## 2023-10-08 ENCOUNTER — Ambulatory Visit (HOSPITAL_COMMUNITY)
Admission: RE | Admit: 2023-10-08 | Discharge: 2023-10-08 | Disposition: A | Discharge status: Home / Self Care | Attending: Vascular Surgery | Admitting: Vascular Surgery

## 2023-10-08 ENCOUNTER — Emergency Department (HOSPITAL_COMMUNITY)
Admission: EM | Admit: 2023-10-08 | Discharge: 2023-10-09 | Discharge status: Against Medical Advice | Source: Home / Self Care | Attending: Emergency Medicine | Admitting: Emergency Medicine

## 2023-10-08 ENCOUNTER — Other Ambulatory Visit: Payer: Self-pay

## 2023-10-08 ENCOUNTER — Encounter (HOSPITAL_COMMUNITY)
Admission: RE | Disposition: A | Discharge status: Home / Self Care | Payer: Self-pay | Source: Home / Self Care | Attending: Vascular Surgery

## 2023-10-08 DIAGNOSIS — N186 End stage renal disease: Secondary | ICD-10-CM

## 2023-10-08 DIAGNOSIS — R0602 Shortness of breath: Secondary | ICD-10-CM | POA: Diagnosis not present

## 2023-10-08 DIAGNOSIS — I5022 Chronic systolic (congestive) heart failure: Secondary | ICD-10-CM | POA: Insufficient documentation

## 2023-10-08 DIAGNOSIS — Z87891 Personal history of nicotine dependence: Secondary | ICD-10-CM | POA: Insufficient documentation

## 2023-10-08 DIAGNOSIS — E875 Hyperkalemia: Secondary | ICD-10-CM | POA: Insufficient documentation

## 2023-10-08 DIAGNOSIS — E8779 Other fluid overload: Secondary | ICD-10-CM | POA: Insufficient documentation

## 2023-10-08 DIAGNOSIS — Z5986 Financial insecurity: Secondary | ICD-10-CM | POA: Insufficient documentation

## 2023-10-08 DIAGNOSIS — I132 Hypertensive heart and chronic kidney disease with heart failure and with stage 5 chronic kidney disease, or end stage renal disease: Secondary | ICD-10-CM | POA: Insufficient documentation

## 2023-10-08 DIAGNOSIS — Z992 Dependence on renal dialysis: Secondary | ICD-10-CM

## 2023-10-08 DIAGNOSIS — I5020 Unspecified systolic (congestive) heart failure: Secondary | ICD-10-CM | POA: Insufficient documentation

## 2023-10-08 DIAGNOSIS — T82858A Stenosis of vascular prosthetic devices, implants and grafts, initial encounter: Secondary | ICD-10-CM | POA: Diagnosis not present

## 2023-10-08 DIAGNOSIS — Z5329 Procedure and treatment not carried out because of patient's decision for other reasons: Secondary | ICD-10-CM | POA: Insufficient documentation

## 2023-10-08 DIAGNOSIS — E8770 Fluid overload, unspecified: Secondary | ICD-10-CM | POA: Diagnosis not present

## 2023-10-08 DIAGNOSIS — T82838A Hemorrhage of vascular prosthetic devices, implants and grafts, initial encounter: Secondary | ICD-10-CM | POA: Diagnosis not present

## 2023-10-08 DIAGNOSIS — R0600 Dyspnea, unspecified: Secondary | ICD-10-CM | POA: Diagnosis not present

## 2023-10-08 DIAGNOSIS — Y832 Surgical operation with anastomosis, bypass or graft as the cause of abnormal reaction of the patient, or of later complication, without mention of misadventure at the time of the procedure: Secondary | ICD-10-CM | POA: Diagnosis not present

## 2023-10-08 DIAGNOSIS — Z5982 Transportation insecurity: Secondary | ICD-10-CM | POA: Diagnosis not present

## 2023-10-08 DIAGNOSIS — I120 Hypertensive chronic kidney disease with stage 5 chronic kidney disease or end stage renal disease: Secondary | ICD-10-CM | POA: Diagnosis not present

## 2023-10-08 HISTORY — PX: UPPER EXTREMITY INTERVENTION: CATH118271

## 2023-10-08 HISTORY — PX: A/V SHUNT INTERVENTION: CATH118220

## 2023-10-08 HISTORY — PX: VENOUS ANGIOPLASTY: CATH118376

## 2023-10-08 LAB — CBC WITH DIFFERENTIAL/PLATELET
Abs Immature Granulocytes: 0.07 10*3/uL (ref 0.00–0.07)
Basophils Absolute: 0 10*3/uL (ref 0.0–0.1)
Basophils Relative: 1 %
Eosinophils Absolute: 0.2 10*3/uL (ref 0.0–0.5)
Eosinophils Relative: 2 %
HCT: 22.7 % — ABNORMAL LOW (ref 39.0–52.0)
Hemoglobin: 7.6 g/dL — ABNORMAL LOW (ref 13.0–17.0)
Immature Granulocytes: 1 %
Lymphocytes Relative: 10 %
Lymphs Abs: 0.9 10*3/uL (ref 0.7–4.0)
MCH: 34.1 pg — ABNORMAL HIGH (ref 26.0–34.0)
MCHC: 33.5 g/dL (ref 30.0–36.0)
MCV: 101.8 fL — ABNORMAL HIGH (ref 80.0–100.0)
Monocytes Absolute: 0.5 10*3/uL (ref 0.1–1.0)
Monocytes Relative: 6 %
Neutro Abs: 6.7 10*3/uL (ref 1.7–7.7)
Neutrophils Relative %: 80 %
Platelets: 228 10*3/uL (ref 150–400)
RBC: 2.23 MIL/uL — ABNORMAL LOW (ref 4.22–5.81)
RDW: 14.5 % (ref 11.5–15.5)
WBC: 8.3 10*3/uL (ref 4.0–10.5)
nRBC: 0 % (ref 0.0–0.2)

## 2023-10-08 LAB — COMPREHENSIVE METABOLIC PANEL WITH GFR
ALT: 13 U/L (ref 0–44)
AST: 9 U/L — ABNORMAL LOW (ref 15–41)
Albumin: 3.6 g/dL (ref 3.5–5.0)
Alkaline Phosphatase: 53 U/L (ref 38–126)
Anion gap: 19 — ABNORMAL HIGH (ref 5–15)
BUN: 121 mg/dL — ABNORMAL HIGH (ref 6–20)
CO2: 17 mmol/L — ABNORMAL LOW (ref 22–32)
Calcium: 6.8 mg/dL — ABNORMAL LOW (ref 8.9–10.3)
Chloride: 99 mmol/L (ref 98–111)
Creatinine, Ser: 15.18 mg/dL — ABNORMAL HIGH (ref 0.61–1.24)
GFR, Estimated: 3 mL/min — ABNORMAL LOW (ref >60)
Glucose, Bld: 89 mg/dL (ref 70–99)
Potassium: 5.6 mmol/L — ABNORMAL HIGH (ref 3.5–5.1)
Sodium: 135 mmol/L (ref 135–145)
Total Bilirubin: 0.8 mg/dL (ref 0.0–1.2)
Total Protein: 7.1 g/dL (ref 6.5–8.1)

## 2023-10-08 LAB — BRAIN NATRIURETIC PEPTIDE: B Natriuretic Peptide: 1019.2 pg/mL — ABNORMAL HIGH (ref 0.0–100.0)

## 2023-10-08 LAB — PHOSPHORUS: Phosphorus: >30 mg/dL — ABNORMAL HIGH (ref 2.5–4.6)

## 2023-10-08 LAB — MAGNESIUM: Magnesium: 2.8 mg/dL — ABNORMAL HIGH (ref 1.7–2.4)

## 2023-10-08 SURGERY — A/V SHUNT INTERVENTION
Anesthesia: LOCAL

## 2023-10-08 MED ORDER — CHLORHEXIDINE GLUCONATE CLOTH 2 % EX PADS: 6.0000 | MEDICATED_PAD | Freq: Every day | CUTANEOUS | Status: DC

## 2023-10-08 MED ORDER — LIDOCAINE HCL (PF) 1 % IJ SOLN
INTRAMUSCULAR | Status: DC | PRN
  Administered 2023-10-08: 5 mL via SUBCUTANEOUS

## 2023-10-08 MED ORDER — IODIXANOL 320 MG/ML IV SOLN
INTRAVENOUS | Status: DC | PRN
  Administered 2023-10-08: 25 mL via INTRAVENOUS

## 2023-10-08 MED ORDER — HEPARIN (PORCINE) IN NACL 1000-0.9 UT/500ML-% IV SOLN
INTRAVENOUS | Status: DC | PRN
  Administered 2023-10-08: 500 mL

## 2023-10-08 SURGICAL SUPPLY — 11 items
BALLOON MUSTANG 7.0X40 75 (BALLOONS) IMPLANT
BALLOON MUSTANG 7X80X75 (BALLOONS) IMPLANT
CATH BEACON 5 .035 65 KMP TIP (CATHETERS) IMPLANT
GUIDEWIRE ANGLED .035 180CM (WIRE) IMPLANT
KIT ENCORE 26 ADVANTAGE (KITS) IMPLANT
SET MICROPUNCTURE 5F STIFF (MISCELLANEOUS) IMPLANT
SHEATH PINNACLE R/O II 6F 4CM (SHEATH) IMPLANT
SHEATH PINNACLE R/O II 7F 4CM (SHEATH) IMPLANT
STENT VIABAHN 8X50X75 (Permanent Stent) IMPLANT
TRAY PV CATH (CUSTOM PROCEDURE TRAY) ×2 IMPLANT
WIRE BENTSON .035X145CM (WIRE) IMPLANT

## 2023-10-08 NOTE — ED Notes (Signed)
 Lab contacted to run blood work off of tubes already in lab.

## 2023-10-08 NOTE — ED Provider Notes (Signed)
 [facility] AT [facility] Provider Note   CSN: [id number] Arrival date & time: 10/08/23  1210     History Chief Complaint  Patient presents with   Needs Dialysis    Paul [name] is a 58 y.o. male with a past medical history of ESRD on hemodialysis Monday Wednesday Friday, schizoaffective disorder, bipolar type, HFrEF who presents to the ED for dialysis.  Patient states he had issue with his fistula which was fixed in clinic today subsequently sent for hemodialysis.  Last hemodialysis session Saturday.  Patient reports shortness of breath.  No chest pain.      Home Medications Prior to Admission medications   Medication Sig Start Date End Date Taking? Authorizing Provider  ARIPiprazole  (ABILIFY ) 5 MG tablet Take 1 tablet (5 mg total) by mouth at bedtime. 09/26/23   Plovsky, Doroteo Gasmen, MD  carvedilol  (COREG ) 6.25 MG tablet Take 1 tablet (6.25 mg total) by mouth 2 (two) times daily with a meal. Patient taking differently: Take 6.25 mg by mouth daily. 07/29/23 09/23/23  Tawkaliyar, Roya, DO  isosorbide  mononitrate (IMDUR ) 30 MG 24 hr tablet Take 1 tablet (30 mg total) by mouth daily. 07/29/23   Tawkaliyar, Roya, DO  rosuvastatin  (CRESTOR ) 5 MG tablet Take 1 tablet (5 mg total) by mouth daily. 03/24/23     sucroferric oxyhydroxide (VELPHORO ) 500 MG chewable tablet Chew 3 tablets (1,500 mg total) by mouth 3 (three) times daily with meals. Patient taking differently: Chew 500 mg by mouth in the morning and at bedtime. 07/29/23   Tawkaliyar, Roya, DO      Allergies    Penicillin g    Review of Systems   Review of Systems See HPI Physical Exam Updated Vital Signs BP (!) 162/86 (BP Location: Right Arm)   Pulse (!) 101   Temp 98.2 F (36.8 C) (Oral)   Resp 18   Wt 82.3 kg   SpO2 100%   BMI 32.14 kg/m  Physical Exam Vitals and nursing note reviewed.  Constitutional:      General: He is not in acute distress.    Appearance: He is well-developed. He is  obese. He is not ill-appearing or diaphoretic.  Eyes:     Comments: Chronic right eye changes  Cardiovascular:     Rate and Rhythm: Normal rate and regular rhythm.     Pulses: Normal pulses.  Pulmonary:     Effort: Pulmonary effort is normal. No respiratory distress.     Breath sounds: Rales present.  Musculoskeletal:     Right lower leg: Edema present.     Left lower leg: Edema present.  Skin:    General: Skin is warm and dry.     Capillary Refill: Capillary refill takes less than 2 seconds.  Neurological:     Mental Status: He is alert.     ED Results / Procedures / Treatments   Labs (all labs ordered are listed, but only abnormal results are displayed) Labs Reviewed  COMPREHENSIVE METABOLIC PANEL WITH GFR - Abnormal; Notable for the following components:      Result Value   Potassium 5.6 (*)    CO2 17 (*)    BUN 121 (*)    Creatinine, Ser 15.18 (*)    Calcium  6.8 (*)    AST 9 (*)    GFR, Estimated 3 (*)    Anion gap 19 (*)    All other components within normal limits  BRAIN NATRIURETIC PEPTIDE - Abnormal; Notable for the following  components:   B Natriuretic Peptide 1,019.2 (*)    All other components within normal limits  CBC WITH DIFFERENTIAL/PLATELET - Abnormal; Notable for the following components:   RBC 2.23 (*)    Hemoglobin 7.6 (*)    HCT 22.7 (*)    MCV 101.8 (*)    MCH 34.1 (*)    All other components within normal limits  MAGNESIUM - Abnormal; Notable for the following components:   Magnesium 2.8 (*)    All other components within normal limits  PHOSPHORUS - Abnormal; Notable for the following components:   Phosphorus >30.0 (*)    All other components within normal limits      Medications Ordered in ED Medications  Chlorhexidine  Gluconate Cloth 2 % PADS 6 each (has no administration in time range)    ED Course/ Medical Decision Making/ A&P    Paul [name] is a 58 y.o. male with a past medical history of ESRD on hemodialysis Monday  Wednesday Friday, schizoaffective disorder, bipolar type, HFrEF who presents to the ED for dialysis.  Patient states he had issue with his fistula which was fixed in clinic today subsequently sent for hemodialysis.  On arrival patient afebrile hemodynamically stable no respiratory distress or hypoxia.  Per chart review patient seen by vascular specialist earlier today for fistula complication.  Patient had shuntogram stent and balloon angioplasty of stent.  Patient was subsequently cleared to use fistula today.  Hyperkalemia 5.6.  Remaining CMP findings consistent with ESRD requiring dialysis.  BNP over 1000.  Hemoglobin 7.6.  Likely delusional secondary to volume overload secondary to ESRD.  No evidence of acute bleeding.  EKG without peaked T waves or arrhythmias  Nephrology consulted while take to dialysis.  Nephrology agrees to defer hyperkalemia treatment as can be taken to dialysis immediately  Patient was seen with supervising physician who agrees with plan.  At time of signout patient was still at dialysis. Per chart review, patient eloped from HD suit  Final Clinical Impression(s) / ED Diagnoses Final diagnoses:  Other hypervolemia  ESRD (end stage renal disease) [facility])    Rx / DC Orders ED Discharge Orders     None         Angele Keller, DO 10/08/23 2337    Mordecai Applebaum, MD 10/09/23 [phone]

## 2023-10-08 NOTE — ED Triage Notes (Signed)
 Patient last had dialysis on Saturday, had a graft today on his fistula, needs dialysis today.  Patient gives verbal consent for MSE.

## 2023-10-08 NOTE — Interval H&P Note (Signed)
 History and Physical Interval Note:  10/08/2023 10:03 AM  Paul [name]  has presented today for surgery, with the diagnosis of Fistulagram.  The various methods of treatment have been discussed with the patient and family. After consideration of risks, benefits and other options for treatment, the patient has consented to  Procedure(s): A/V SHUNT INTERVENTION (N/A) as a surgical intervention.  The patient's history has been reviewed, patient examined, no change in status, stable for surgery.  I have reviewed the patient's chart and labs.  Questions were answered to the patient's satisfaction.     [name]

## 2023-10-08 NOTE — ED Notes (Addendum)
 This pt returned from dialysis via transported. Transporter had not locked the bed yet when pt pushed himself to the end of the bed. Pt then proceeded walked out. This RN asked pt if he would allow us  to recheck his VS. Pt continued to walk out. Did not allow this RN to evaluate pt.

## 2023-10-08 NOTE — ED Notes (Signed)
 LG, Lav & 2 SST drawn and sent to main lab

## 2023-10-08 NOTE — ED Notes (Signed)
 Current EDP Bero informed of pts departure.

## 2023-10-08 NOTE — Op Note (Signed)
    Patient name: Paul [name] MRN: [id number] DOB: January 09, 1966 Sex: male  10/08/2023 Pre-operative Diagnosis: End-stage renal disease, malfunction right arm AV fistula Post-operative diagnosis:  Same Surgeon:  Sarajane Cumming. Vikki Graves, MD Procedure Performed: 1.  Percutaneous ultrasound-guided access right upper arm AV fistula 2.  Right upper extremity shuntogram 3.  Stent right cephalic arch with 8 x 50 mm Viabahn 4.  Plain balloon angioplasty right cephalic vein fistula with 7 mm balloon   Indications: 58 year old male with history of end-stage renal disease dialyzing via cephalic vein fistula he has had bleeding after dialysis and has had a stitch placed with pulsatile fistula is indicated for fistulogram with possible intervention.  Findings: The arteriovenous anastomosis is patent and the fistula is very healthy up to the level of the more cephalad arm where it becomes quite sclerotic with approximately 80% stenosis which was ballooned open to less than 20% and at the arch there is a subtotal occlusion which was stented to only 20% residual stenosis and at completion there is a much improved thrill.  Fistula is okay for use today and remains amenable to future percutaneous options although he may require cephalic vein turndown versus graft in the future.   Procedure:  The patient was identified in the holding area and taken to room 8.  The patient was then placed supine on the table and prepped and draped in the usual sterile fashion.  A time out was called.  Ultrasound was used to evaluate the right arm AV fistula.  The area was anesthetized 1% lidocaine  and cannulated with a micropuncture needle followed by wire and sheath.  An ultrasound image saved the permanent record.  We performed right upper extremity fistulogram with the above findings we placed a Bentson wire followed by a 7 [place] sheath.  Kumpe catheter and Glidewire were used to cross the stenosed and subtotally occluded cephalic arch  we confirmed intraluminal access and placed a Bentson wire in the right atrium.  We then performed balloon angioplasty with 7 mm balloon of the arch followed back through the sclerotic area in the fistula.  Unfortunately there remained very tight stenosis of the cephalic arch and this was stented primarily with Viabahn 8 x 50 mm and this was postdilated with a 740 mm balloon but remained with 20% residual stenosis.  At completion there was a much improved thrill in the fistula and the balloon and wire were removed.  The access site was suture-ligated with 4-0 Monocryl suture.  Patient tolerated procedure without immediate complication.  Contrast: 25 cc  Akeria Hedstrom C. Vikki Graves, MD [facility] Office: [phone] Pager: [phone]

## 2023-10-08 NOTE — ED Provider Triage Note (Signed)
 Emergency Medicine Provider Triage Evaluation Note  Paul [name] , a 58 y.o. male  was evaluated in triage.  Pt complains of needing dialysis no other complaints  Review of Systems  Positive: Negative: Shortness of breath  Physical Exam  BP (!) 178/100 (BP Location: Right Arm)   Pulse 88   Temp 97.7 F (36.5 C)   Resp 18   Wt 80 kg   SpO2 99%   BMI 31.24 kg/m  Gen:   Awake, no distress   Resp:  Normal effort  MSK:   Moves extremities without difficulty  Other:    Medical Decision Making  Medically screening exam initiated at 1:37 PM.  Appropriate orders placed.  Paul [name] was informed that the remainder of the evaluation will be completed by another provider, this initial triage assessment does not replace that evaluation, and the importance of remaining in the ED until their evaluation is complete.     Sandi Crosby, PA-C 10/08/23 1337

## 2023-10-08 NOTE — Progress Notes (Signed)
 Received patient in bed to unit from day shift HD nurses Alert and oriented.  Informed consent signed and in chart. TX duration: 3.5hrs   Patient tolerated well.  Transported back to the room Alert, without acute distress.  Hand-off given to patient's nurse.   Access used: AVF  Access issues: none  Total UF removed:  Medication(s) given: none  Post HD VS: see table  Post HD weight: UTA      10/08/23 2205  Vitals  Temp 98.2 F (36.8 C)  Temp Source Oral  BP (!) 172/91  MAP (mmHg) 112  BP Location Right Arm  BP Method Automatic  Patient Position (if appropriate) Lying  Pulse Rate 98  Pulse Rate Source Monitor  ECG Heart Rate 99  Resp 17  Oxygen Therapy  SpO2 100 %  O2 Device Room Air  Patient Activity (if Appropriate) In bed  Pulse Oximetry Type Continuous  During Treatment Monitoring  Blood Flow Rate (mL/min) 0 mL/min  Arterial Pressure (mmHg) -7.88 mmHg  Venous Pressure (mmHg) 188.88 mmHg  TMP (mmHg) 24.24 mmHg  Ultrafiltration Rate (mL/min) 962 mL/min  Dialysate Flow Rate (mL/min) 299 ml/min  Dialysate Potassium Concentration 2  Dialysate Calcium  Concentration 2.5  Duration of HD Treatment -hour(s) 3.5 hour(s)  Cumulative Fluid Removed (mL) per Treatment  2500.19  HD Safety Checks Performed Yes  Intra-Hemodialysis Comments Tx completed  Post Treatment  Dialyzer Clearance Lightly streaked  Hemodialysis Intake (mL) 0 mL  Liters Processed 43.5  Fluid Removed (mL) 2500 mL  Tolerated HD Treatment Yes  Post-Hemodialysis Comments HD done, treatment torelated.  AVG/AVF Arterial Site Held (minutes) 10 minutes  AVG/AVF Venous Site Held (minutes) 10 minutes  Fistula / Graft Right Other (Comment) Arteriovenous fistula  Placement Date/Time: 07/08/22 0981   Placed prior to admission: No  Orientation: Right  Access Location: (c) Other (Comment)  Access Type: Arteriovenous fistula  Site Condition No complications  Fistula / Graft Assessment Present;Thrill;Bruit   Status Deaccessed;Patent  Drainage Description None     Staci Dykes, BSN, RN [facility]

## 2023-10-09 ENCOUNTER — Ambulatory Visit: Admitting: Podiatry

## 2023-10-09 ENCOUNTER — Encounter (HOSPITAL_COMMUNITY): Payer: Self-pay | Admitting: Vascular Surgery

## 2023-10-09 NOTE — Progress Notes (Signed)
   10/08/23 2205  Vitals  Temp 98.2 F (36.8 C)  Temp Source Oral  BP (!) 172/91  MAP (mmHg) 112  BP Location Right Arm  BP Method Automatic  Patient Position (if appropriate) Lying  Pulse Rate 98  Pulse Rate Source Monitor  ECG Heart Rate 99  Resp 17  Oxygen Therapy  SpO2 100 %  O2 Device Room Air  Patient Activity (if Appropriate) In bed  Pulse Oximetry Type Continuous  During Treatment Monitoring  Blood Flow Rate (mL/min) 0 mL/min  Arterial Pressure (mmHg) -7.88 mmHg  Venous Pressure (mmHg) 188.88 mmHg  TMP (mmHg) 24.24 mmHg  Ultrafiltration Rate (mL/min) 962 mL/min  Dialysate Flow Rate (mL/min) 299 ml/min  Dialysate Potassium Concentration 2  Dialysate Calcium  Concentration 2.5  Duration of HD Treatment -hour(s) 3.5 hour(s)  Cumulative Fluid Removed (mL) per Treatment  2500.19  HD Safety Checks Performed Yes  Intra-Hemodialysis Comments Tx completed  Post Treatment  Dialyzer Clearance Lightly streaked  Hemodialysis Intake (mL) 0 mL  Liters Processed 43.5  Fluid Removed (mL) 2500 mL  Tolerated HD Treatment Yes  Post-Hemodialysis Comments HD done, treatment torelated.  AVG/AVF Arterial Site Held (minutes) 10 minutes  AVG/AVF Venous Site Held (minutes) 10 minutes  Fistula / Graft Right Other (Comment) Arteriovenous fistula  Placement Date/Time: 07/08/22 7829   Placed prior to admission: No  Orientation: Right  Access Location: (c) Other (Comment)  Access Type: Arteriovenous fistula  Site Condition No complications  Fistula / Graft Assessment Present;Thrill;Bruit  Status Deaccessed;Patent  Drainage Description None

## 2023-10-10 ENCOUNTER — Emergency Department (HOSPITAL_COMMUNITY)
Admission: EM | Admit: 2023-10-10 | Discharge: 2023-10-10 | Disposition: A | Discharge status: Home / Self Care | Attending: Emergency Medicine | Admitting: Emergency Medicine

## 2023-10-10 ENCOUNTER — Other Ambulatory Visit: Payer: Self-pay

## 2023-10-10 DIAGNOSIS — N186 End stage renal disease: Secondary | ICD-10-CM | POA: Diagnosis not present

## 2023-10-10 DIAGNOSIS — D631 Anemia in chronic kidney disease: Secondary | ICD-10-CM | POA: Insufficient documentation

## 2023-10-10 DIAGNOSIS — Z992 Dependence on renal dialysis: Secondary | ICD-10-CM | POA: Insufficient documentation

## 2023-10-10 DIAGNOSIS — I120 Hypertensive chronic kidney disease with stage 5 chronic kidney disease or end stage renal disease: Secondary | ICD-10-CM | POA: Diagnosis not present

## 2023-10-10 LAB — COMPREHENSIVE METABOLIC PANEL WITH GFR
ALT: 15 U/L (ref 0–44)
AST: 13 U/L — ABNORMAL LOW (ref 15–41)
Albumin: 3.8 g/dL (ref 3.5–5.0)
Alkaline Phosphatase: 65 U/L (ref 38–126)
Anion gap: 18 — ABNORMAL HIGH (ref 5–15)
BUN: 74 mg/dL — ABNORMAL HIGH (ref 6–20)
CO2: 19 mmol/L — ABNORMAL LOW (ref 22–32)
Calcium: 7.6 mg/dL — ABNORMAL LOW (ref 8.9–10.3)
Chloride: 101 mmol/L (ref 98–111)
Creatinine, Ser: 11.35 mg/dL — ABNORMAL HIGH (ref 0.61–1.24)
GFR, Estimated: 5 mL/min — ABNORMAL LOW (ref >60)
Glucose, Bld: 94 mg/dL (ref 70–99)
Potassium: 4.1 mmol/L (ref 3.5–5.1)
Sodium: 138 mmol/L (ref 135–145)
Total Bilirubin: 0.9 mg/dL (ref 0.0–1.2)
Total Protein: 7.3 g/dL (ref 6.5–8.1)

## 2023-10-10 LAB — CBC WITH DIFFERENTIAL/PLATELET
Abs Immature Granulocytes: 0.05 10*3/uL (ref 0.00–0.07)
Basophils Absolute: 0 10*3/uL (ref 0.0–0.1)
Basophils Relative: 1 %
Eosinophils Absolute: 0.2 10*3/uL (ref 0.0–0.5)
Eosinophils Relative: 2 %
HCT: 23.7 % — ABNORMAL LOW (ref 39.0–52.0)
Hemoglobin: 8.1 g/dL — ABNORMAL LOW (ref 13.0–17.0)
Immature Granulocytes: 1 %
Lymphocytes Relative: 11 %
Lymphs Abs: 0.9 10*3/uL (ref 0.7–4.0)
MCH: 34.8 pg — ABNORMAL HIGH (ref 26.0–34.0)
MCHC: 34.2 g/dL (ref 30.0–36.0)
MCV: 101.7 fL — ABNORMAL HIGH (ref 80.0–100.0)
Monocytes Absolute: 1.1 10*3/uL — ABNORMAL HIGH (ref 0.1–1.0)
Monocytes Relative: 13 %
Neutro Abs: 5.9 10*3/uL (ref 1.7–7.7)
Neutrophils Relative %: 72 %
Platelets: 212 10*3/uL (ref 150–400)
RBC: 2.33 MIL/uL — ABNORMAL LOW (ref 4.22–5.81)
RDW: 14.4 % (ref 11.5–15.5)
WBC: 8.1 10*3/uL (ref 4.0–10.5)
nRBC: 0 % (ref 0.0–0.2)

## 2023-10-10 MED ORDER — HEPARIN SODIUM (PORCINE) 1000 UNIT/ML DIALYSIS
1000.0000 [IU] | INTRAMUSCULAR | Status: DC | PRN
Start: 2023-10-10 — End: 2023-10-10

## 2023-10-10 MED ORDER — CHLORHEXIDINE GLUCONATE CLOTH 2 % EX PADS: 6.0000 | MEDICATED_PAD | Freq: Every day | CUTANEOUS | Status: DC

## 2023-10-10 MED ORDER — HEPARIN SODIUM (PORCINE) 1000 UNIT/ML DIALYSIS
2400.0000 [IU] | Freq: Once | INTRAMUSCULAR | Status: AC
  Administered 2023-10-10: 2400 [IU] via INTRAVENOUS_CENTRAL

## 2023-10-10 MED ORDER — HEPARIN SODIUM (PORCINE) 1000 UNIT/ML IJ SOLN
INTRAMUSCULAR | Status: AC
  Filled 2023-10-10: qty 4

## 2023-10-10 MED ORDER — DARBEPOETIN ALFA 100 MCG/0.5ML IJ SOSY
100.0000 ug | PREFILLED_SYRINGE | Freq: Once | INTRAMUSCULAR | Status: AC
  Administered 2023-10-10: 100 ug via SUBCUTANEOUS
  Filled 2023-10-10: qty 0.5

## 2023-10-10 NOTE — ED Triage Notes (Addendum)
 Patient is here for his routine hemodialysis treatment q Mon/Wed/Fri. Respirations unlabored . Hypertensive at triage .

## 2023-10-10 NOTE — ED Provider Notes (Signed)
 [place] EMERGENCY DEPARTMENT AT [facility] Provider Note   CSN: [id number] Arrival date & time: 10/10/23  7846     History  Chief Complaint  Patient presents with   Hemodialysis    Paul [name] is a 58 y.o. male.  The history is provided by the patient and medical records. No language interpreter was used.     58 year old male history of end-stage renal disease presenting today for his routine hemodialysis treatment.  He normally receives treatment on Monday Wednesday and Friday.  Last treatment was 2 days ago.  No other complaints at this time.  Home Medications Prior to Admission medications   Medication Sig Start Date End Date Taking? Authorizing Provider  ARIPiprazole  (ABILIFY ) 5 MG tablet Take 1 tablet (5 mg total) by mouth at bedtime. 09/26/23   Plovsky, Doroteo Gasmen, MD  carvedilol  (COREG ) 6.25 MG tablet Take 1 tablet (6.25 mg total) by mouth 2 (two) times daily with a meal. Patient taking differently: Take 6.25 mg by mouth daily. 07/29/23 09/23/23  Tawkaliyar, Roya, DO  isosorbide  mononitrate (IMDUR ) 30 MG 24 hr tablet Take 1 tablet (30 mg total) by mouth daily. 07/29/23   Tawkaliyar, Roya, DO  rosuvastatin  (CRESTOR ) 5 MG tablet Take 1 tablet (5 mg total) by mouth daily. 03/24/23     sucroferric oxyhydroxide (VELPHORO ) 500 MG chewable tablet Chew 3 tablets (1,500 mg total) by mouth 3 (three) times daily with meals. Patient taking differently: Chew 500 mg by mouth in the morning and at bedtime. 07/29/23   Tawkaliyar, Roya, DO      Allergies    Penicillin g    Review of Systems   Review of Systems  All other systems reviewed and are negative.   Physical Exam Updated Vital Signs BP (!) 180/99 (BP Location: Left Arm)   Pulse 92   Temp 98.1 F (36.7 C) (Oral)   Resp 16   SpO2 100%  Physical Exam Constitutional:      General: He is not in acute distress.    Appearance: He is well-developed.  HENT:     Head: Atraumatic.  Eyes:     Conjunctiva/sclera:  Conjunctivae normal.  Cardiovascular:     Rate and Rhythm: Normal rate and regular rhythm.     Pulses: Normal pulses.     Heart sounds: Normal heart sounds.  Pulmonary:     Effort: Pulmonary effort is normal.     Breath sounds: Normal breath sounds. No wheezing, rhonchi or rales.  Abdominal:     Palpations: Abdomen is soft.  Musculoskeletal:        General: No swelling.     Cervical back: Normal range of motion and neck supple.  Skin:    Findings: No rash.  Neurological:     Mental Status: He is alert.     ED Results / Procedures / Treatments   Labs (all labs ordered are listed, but only abnormal results are displayed) Labs Reviewed  COMPREHENSIVE METABOLIC PANEL WITH GFR - Abnormal; Notable for the following components:      Result Value   CO2 19 (*)    BUN 74 (*)    Creatinine, Ser 11.35 (*)    Calcium  7.6 (*)    AST 13 (*)    GFR, Estimated 5 (*)    Anion gap 18 (*)    All other components within normal limits  CBC WITH DIFFERENTIAL/PLATELET - Abnormal; Notable for the following components:   RBC 2.33 (*)    Hemoglobin 8.1 (*)  HCT 23.7 (*)    MCV 101.7 (*)    MCH 34.8 (*)    Monocytes Absolute 1.1 (*)    All other components within normal limits  CBC WITH DIFFERENTIAL/PLATELET    EKG None  Radiology DG Chest 2 View Result Date: 10/08/2023 CLINICAL DATA:  Dyspnea. EXAM: CHEST - 2 VIEW COMPARISON:  Sep 10, 2023. FINDINGS: Stable cardiomediastinal silhouette. No acute pulmonary disease is noted. Bony thorax is unremarkable. IMPRESSION: No active cardiopulmonary disease. Electronically Signed   By: Rosalene Colon M.D.   On: 10/08/2023 16:47   PERIPHERAL VASCULAR CATHETERIZATION Result Date: 10/08/2023 Images from the original result were not included. Patient name: Paul [name] MRN: [id number] DOB: 07-Dec-1965 Sex: male 10/08/2023 Pre-operative Diagnosis: End-stage renal disease, malfunction right arm AV fistula Post-operative diagnosis:  Same Surgeon:  Sarajane Cumming.  Vikki Graves, MD Procedure Performed: 1.  Percutaneous ultrasound-guided access right upper arm AV fistula 2.  Right upper extremity shuntogram 3.  Stent right cephalic arch with 8 x 50 mm Viabahn 4.  Plain balloon angioplasty right cephalic vein fistula with 7 mm balloon Indications: 58 year old male with history of end-stage renal disease dialyzing via cephalic vein fistula he has had bleeding after dialysis and has had a stitch placed with pulsatile fistula is indicated for fistulogram with possible intervention. Findings: The arteriovenous anastomosis is patent and the fistula is very healthy up to the level of the more cephalad arm where it becomes quite sclerotic with approximately 80% stenosis which was ballooned open to less than 20% and at the arch there is a subtotal occlusion which was stented to only 20% residual stenosis and at completion there is a much improved thrill. Fistula is okay for use today and remains amenable to future percutaneous options although he may require cephalic vein turndown versus graft in the future.  Procedure:  The patient was identified in the holding area and taken to room 8.  The patient was then placed supine on the table and prepped and draped in the usual sterile fashion.  A time out was called.  Ultrasound was used to evaluate the right arm AV fistula.  The area was anesthetized 1% lidocaine  and cannulated with a micropuncture needle followed by wire and sheath.  An ultrasound image saved the permanent record.  We performed right upper extremity fistulogram with the above findings we placed a Bentson wire followed by a 7 [place] sheath.  Kumpe catheter and Glidewire were used to cross the stenosed and subtotally occluded cephalic arch we confirmed intraluminal access and placed a Bentson wire in the right atrium.  We then performed balloon angioplasty with 7 mm balloon of the arch followed back through the sclerotic area in the fistula.  Unfortunately there remained very tight  stenosis of the cephalic arch and this was stented primarily with Viabahn 8 x 50 mm and this was postdilated with a 740 mm balloon but remained with 20% residual stenosis.  At completion there was a much improved thrill in the fistula and the balloon and wire were removed.  The access site was suture-ligated with 4-0 Monocryl suture.  Patient tolerated procedure without immediate complication. Contrast: 25 cc Brandon C. Vikki Graves, MD [facility] Office: [phone] Pager: [phone]    Procedures Procedures    Medications Ordered in ED Medications  Chlorhexidine  Gluconate Cloth 2 % PADS 6 each (has no administration in time range)    ED Course/ Medical Decision Making/ A&P  Medical Decision Making Amount and/or Complexity of Data Reviewed Labs: ordered.   BP (!) 167/103 (BP Location: Right Arm)   Pulse 93   Temp 97.8 F (36.6 C) (Oral)   Resp 16   SpO2 93%   63:98 PM  59 year old male history of end-stage renal disease presenting today for his routine hemodialysis treatment.  He normally receives treatment on Monday Wednesday and Friday.  Last treatment was 2 days ago.  No other complaints at this time.  On exam patient is resting comfortably appears to be in no acute discomfort.  He is watching video on his phone.  He is in no respiratory discomfort, lungs are clear, right AV fistula with palpable thrill and bruit.  Nephrologist Dr. Zana Hesselbach is aware of patient and will have patient dialyze for his routine dialysis.  Labs obtained and reviewed interpreted by me and overall reassuring and at baseline.        Final Clinical Impression(s) / ED Diagnoses Final diagnoses:  Dialysis patient [facility])    Rx / DC Orders ED Discharge Orders     None         Debbra Fairy, PA-C 10/10/23 1214    Lind Repine, MD 10/11/23 1501

## 2023-10-10 NOTE — Procedures (Signed)
 Asked to see this patient for hospital dialysis. The plan will be for "ED HD". Pt is not to be admitted at this time. Pt will go to the dialysis unit when they are ready for the patient. When dialysis is completed pt will be sent back to ED for reassessment.    Last OP HD unit info --> was released 07/2023 from [facility] 4h  B400  76.5kg  2K bath    RUA AVF  Heparin  2400    Clinical issues other than volume: 1) Anemia of esrd:  - pt dose not have a nephrologist or HD unit so does not have access to ESA or IV iron  other than when here for hospital HD sessions - recent Hb in the 7- 8 range - last 3 doses of esa were: - darbe 40 mcg 09/03/23 - darbe 40 mcg 09/10/23 - darbe 100 mcg 09/22/23 - last tsat 24% and ferritin 1828 on 09/05/23-> ferritin too high to give IV fe - plan --> will order darbe 100mcg once SQ while here today  I was present at the procedure, reviewed the HD regimen and made appropriate changes.   Larry Poag MD  [facility] 10/10/2023, 4:50 PM

## 2023-10-10 NOTE — Progress Notes (Signed)
 Received patient in stretcher  to unit. [place] 3 Alert and oriented. A & O x 3 Informed consent signed and in chart. Yes  TX duration: 2 hours and 25 minutes   Patient tolerated well. Off 1 hour early in order to get transportation Transported back to the room via stretcher Alert, without acute distress.  Hand-off given to patient's nurse. Gurney Lefort RN  Access used: Right AVG Access issues: None  Total UF removed: 1330 Medication(s) given: Epogen SQ Post HD weight: Unable to weight due to being on stretcher Post HD VS: 191/98, 100% on RA, 16 Resp, 109P   Mia Milan S Rhys Lichty RN, DNP [facility]

## 2023-10-12 IMAGING — US US RENAL
1 series · 13 of 25 positions shown · non-contrast
Comparison: None.
COMPARISON: None.

Addendum:
CLINICAL DATA: Renal dysfunction

EXAM:
RENAL / URINARY TRACT ULTRASOUND COMPLETE

[Series 1: us renal · 13 of 36 slices shown]
[im 1/36]
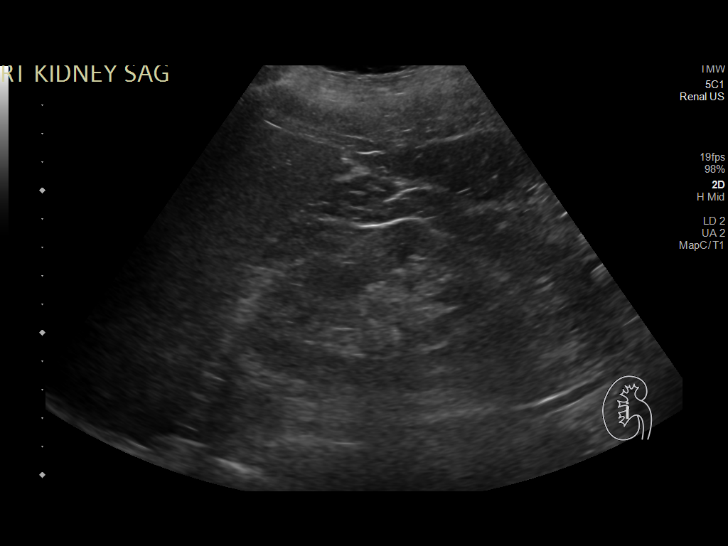
[im 3/36]
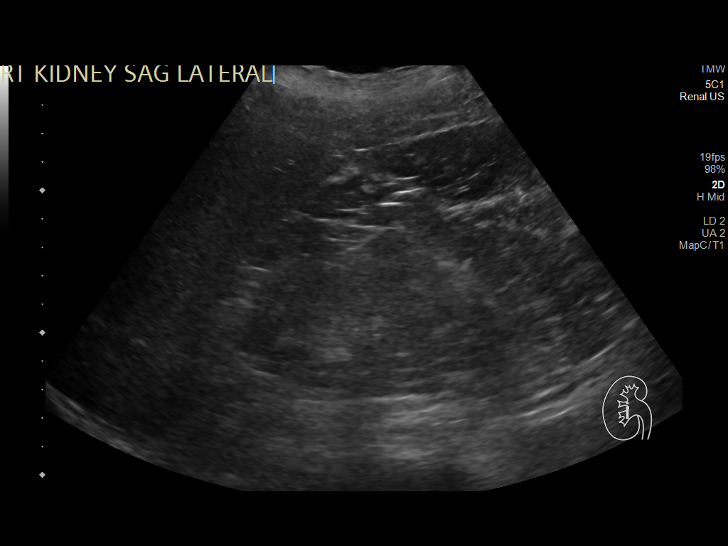
[im 6/36]
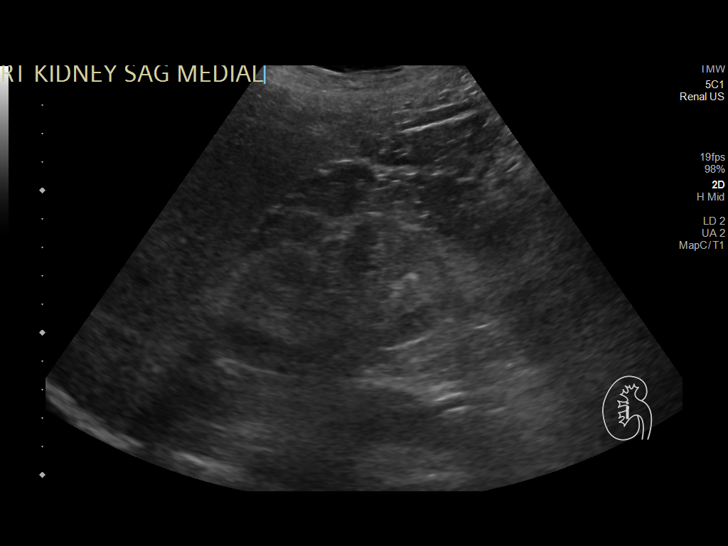
[im 9/36]
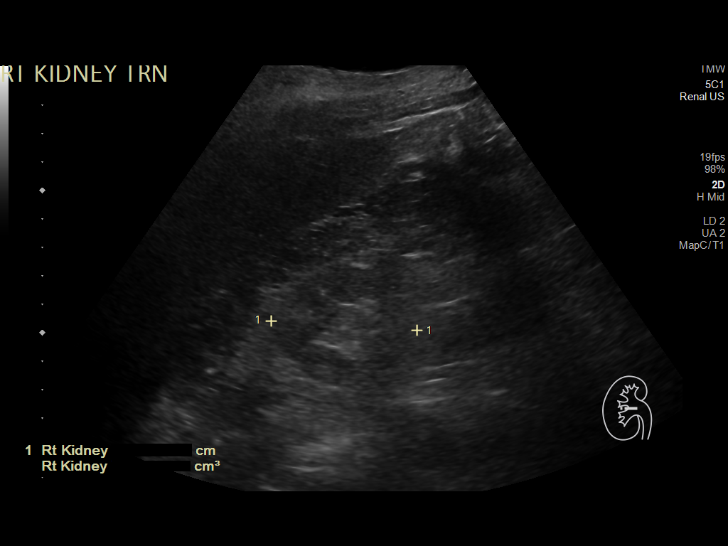
[im 12/36]
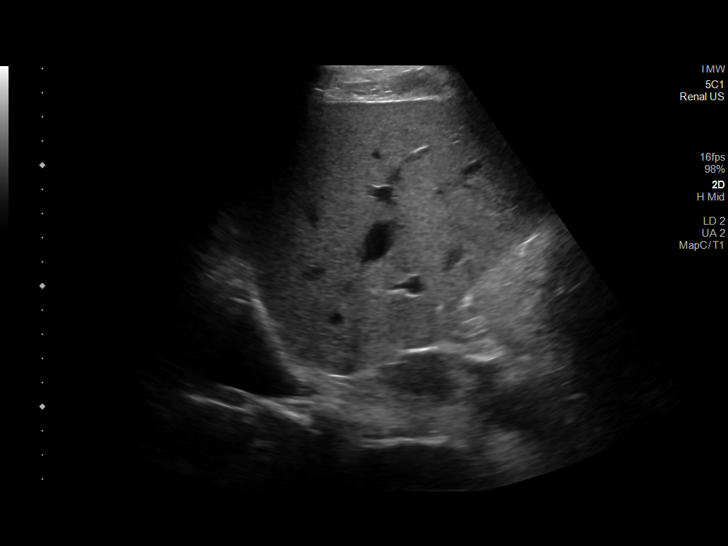
[im 15/36]
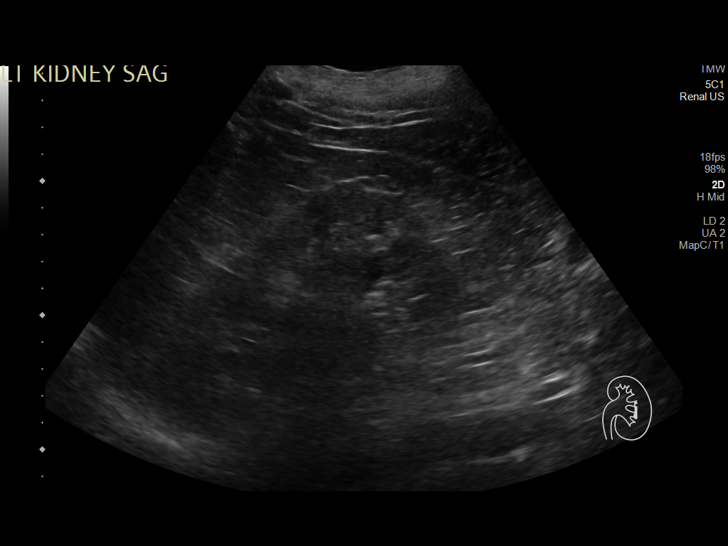
[im 18/36]
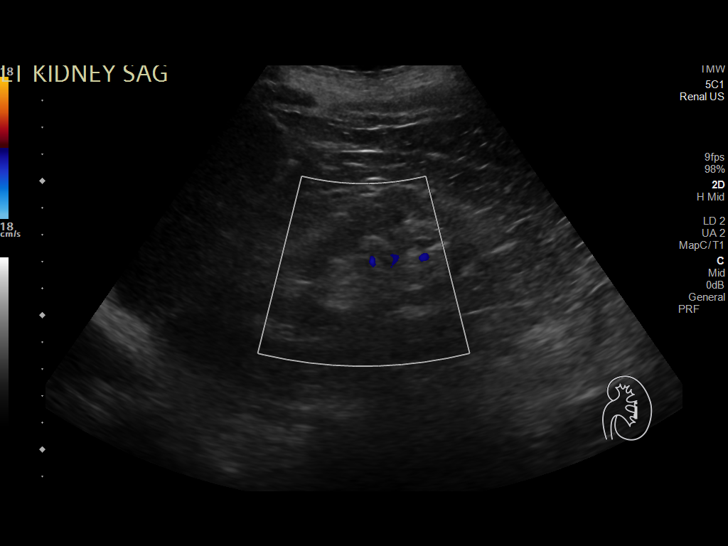
[im 21/36]
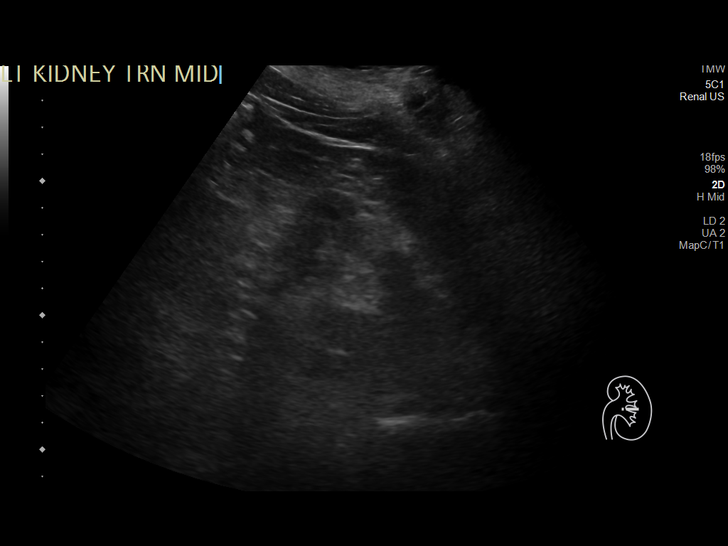
[im 24/36]
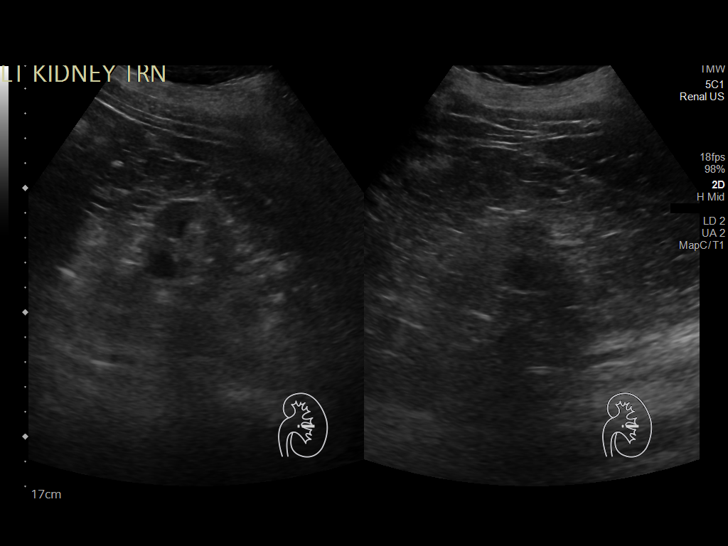
[im 27/36]
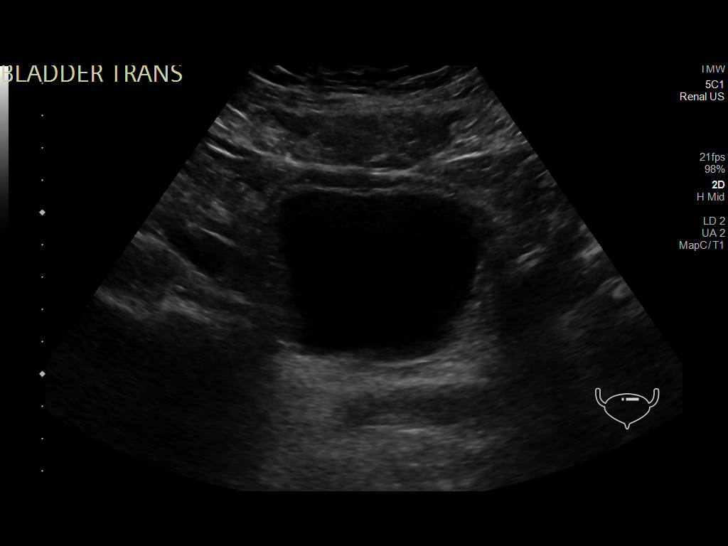
[im 30/36]
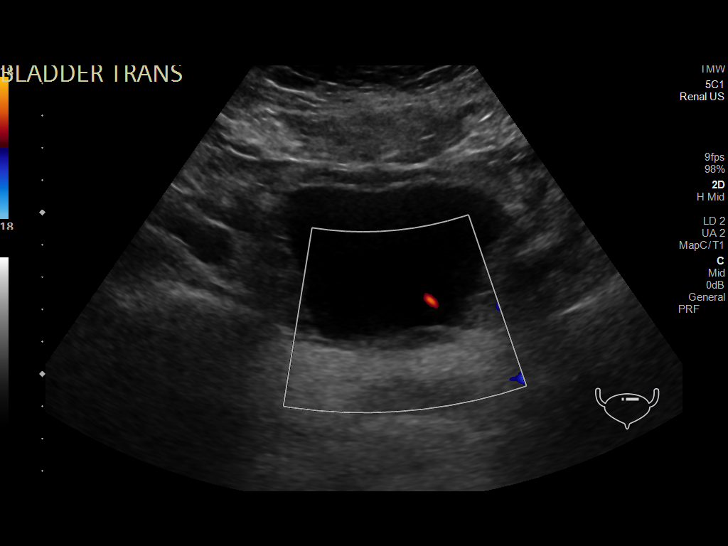
[im 33/36]
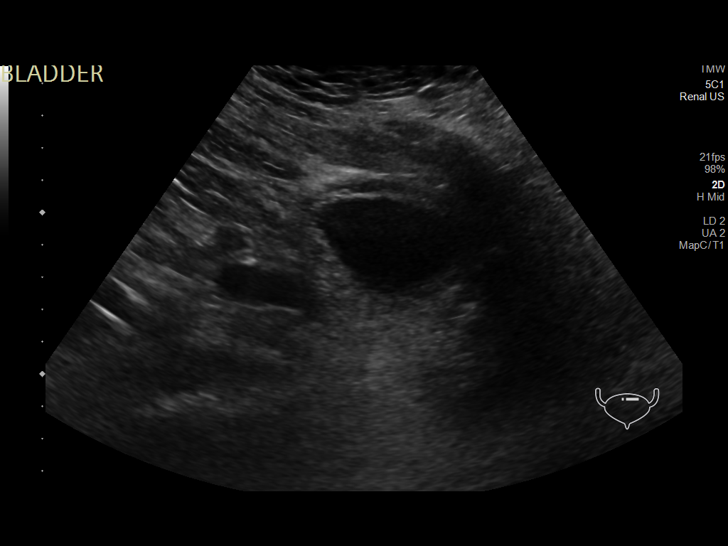
[im 36/36]
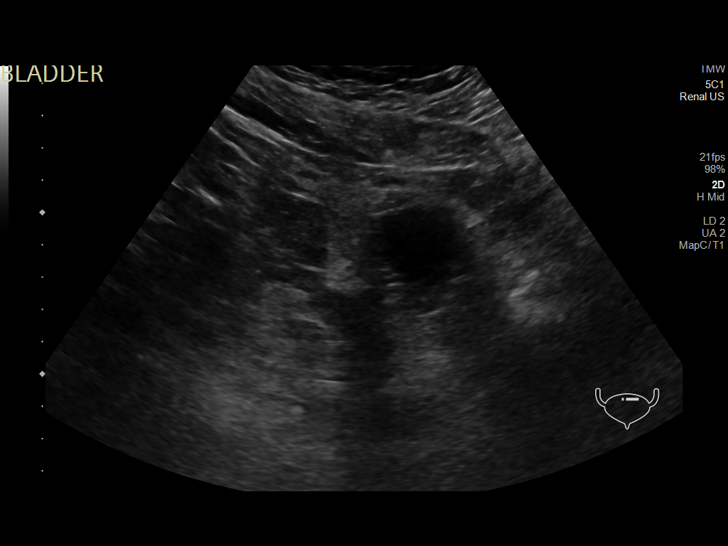

[13 of 25 positions shown; findings below may reference images not displayed]

FINDINGS: Right Kidney:

Renal measurements: 8.6 x 5.4 x 5.1 cm = volume: 123.6 mL. There is
no hydronephrosis. There is increased cortical echogenicity.

Left Kidney:

Renal measurements: 9.2 x 5.6 x 5.1 cm = volume: 136.9 mL. There is
no hydronephrosis. There is increased cortical echogenicity. There
is 1.6 cm anechoic structure in the lower pole of left kidney.

Bladder:

Bilateral ureteral jets are observed.

Other:

None.
IMPRESSION: There is no hydronephrosis. There is increased cortical echogenicity
in both kidneys suggesting medical renal disease. 1.6 cm cyst is
seen in the left kidney.

ADDENDUM:
Incidental note is made of small right pleural effusion.

*** End of Addendum ***
FINDINGS: Right Kidney:

Renal measurements: 8.6 x 5.4 x 5.1 cm = volume: 123.6 mL. There is
no hydronephrosis. There is increased cortical echogenicity.

Left Kidney:

Renal measurements: 9.2 x 5.6 x 5.1 cm = volume: 136.9 mL. There is
no hydronephrosis. There is increased cortical echogenicity. There
is 1.6 cm anechoic structure in the lower pole of left kidney.

Bladder:

Bilateral ureteral jets are observed.

Other:

None.
IMPRESSION: There is no hydronephrosis. There is increased cortical echogenicity
in both kidneys suggesting medical renal disease. 1.6 cm cyst is
seen in the left kidney.

## 2023-10-13 ENCOUNTER — Other Ambulatory Visit: Payer: Self-pay

## 2023-10-13 ENCOUNTER — Encounter (HOSPITAL_COMMUNITY): Payer: Self-pay

## 2023-10-13 ENCOUNTER — Emergency Department (HOSPITAL_COMMUNITY)
Admission: EM | Admit: 2023-10-13 | Discharge: 2023-10-13 | Discharge status: Against Medical Advice | Attending: Emergency Medicine | Admitting: Emergency Medicine

## 2023-10-13 DIAGNOSIS — Z992 Dependence on renal dialysis: Secondary | ICD-10-CM | POA: Insufficient documentation

## 2023-10-13 DIAGNOSIS — R0602 Shortness of breath: Secondary | ICD-10-CM | POA: Diagnosis not present

## 2023-10-13 DIAGNOSIS — N186 End stage renal disease: Secondary | ICD-10-CM | POA: Insufficient documentation

## 2023-10-13 DIAGNOSIS — D631 Anemia in chronic kidney disease: Secondary | ICD-10-CM | POA: Diagnosis not present

## 2023-10-13 DIAGNOSIS — D649 Anemia, unspecified: Secondary | ICD-10-CM | POA: Diagnosis not present

## 2023-10-13 DIAGNOSIS — Z79899 Other long term (current) drug therapy: Secondary | ICD-10-CM | POA: Diagnosis not present

## 2023-10-13 DIAGNOSIS — I120 Hypertensive chronic kidney disease with stage 5 chronic kidney disease or end stage renal disease: Secondary | ICD-10-CM | POA: Diagnosis not present

## 2023-10-13 DIAGNOSIS — I10 Essential (primary) hypertension: Secondary | ICD-10-CM

## 2023-10-13 LAB — CBC
HCT: 21.7 % — ABNORMAL LOW (ref 39.0–52.0)
Hemoglobin: 7.1 g/dL — ABNORMAL LOW (ref 13.0–17.0)
MCH: 33.6 pg (ref 26.0–34.0)
MCHC: 32.7 g/dL (ref 30.0–36.0)
MCV: 102.8 fL — ABNORMAL HIGH (ref 80.0–100.0)
Platelets: 200 10*3/uL (ref 150–400)
RBC: 2.11 MIL/uL — ABNORMAL LOW (ref 4.22–5.81)
RDW: 14.2 % (ref 11.5–15.5)
WBC: 8.7 10*3/uL (ref 4.0–10.5)
nRBC: 0 % (ref 0.0–0.2)

## 2023-10-13 LAB — BASIC METABOLIC PANEL WITH GFR
Anion gap: 16 — ABNORMAL HIGH (ref 5–15)
BUN: 85 mg/dL — ABNORMAL HIGH (ref 6–20)
CO2: 20 mmol/L — ABNORMAL LOW (ref 22–32)
Calcium: 6.6 mg/dL — ABNORMAL LOW (ref 8.9–10.3)
Chloride: 102 mmol/L (ref 98–111)
Creatinine, Ser: 13.06 mg/dL — ABNORMAL HIGH (ref 0.61–1.24)
GFR, Estimated: 4 mL/min — ABNORMAL LOW (ref >60)
Glucose, Bld: 112 mg/dL — ABNORMAL HIGH (ref 70–99)
Potassium: 5 mmol/L (ref 3.5–5.1)
Sodium: 138 mmol/L (ref 135–145)

## 2023-10-13 LAB — HEPATITIS B SURFACE ANTIGEN: Hepatitis B Surface Ag: NONREACTIVE

## 2023-10-13 IMAGING — DX DG CHEST 1V PORT
1 series · 1 of 1 positions shown · non-contrast
Comparison: 06/02/2021 and older studies.

CLINICAL DATA: Short of breath.

EXAM:
PORTABLE CHEST 1 VIEW

[chest]
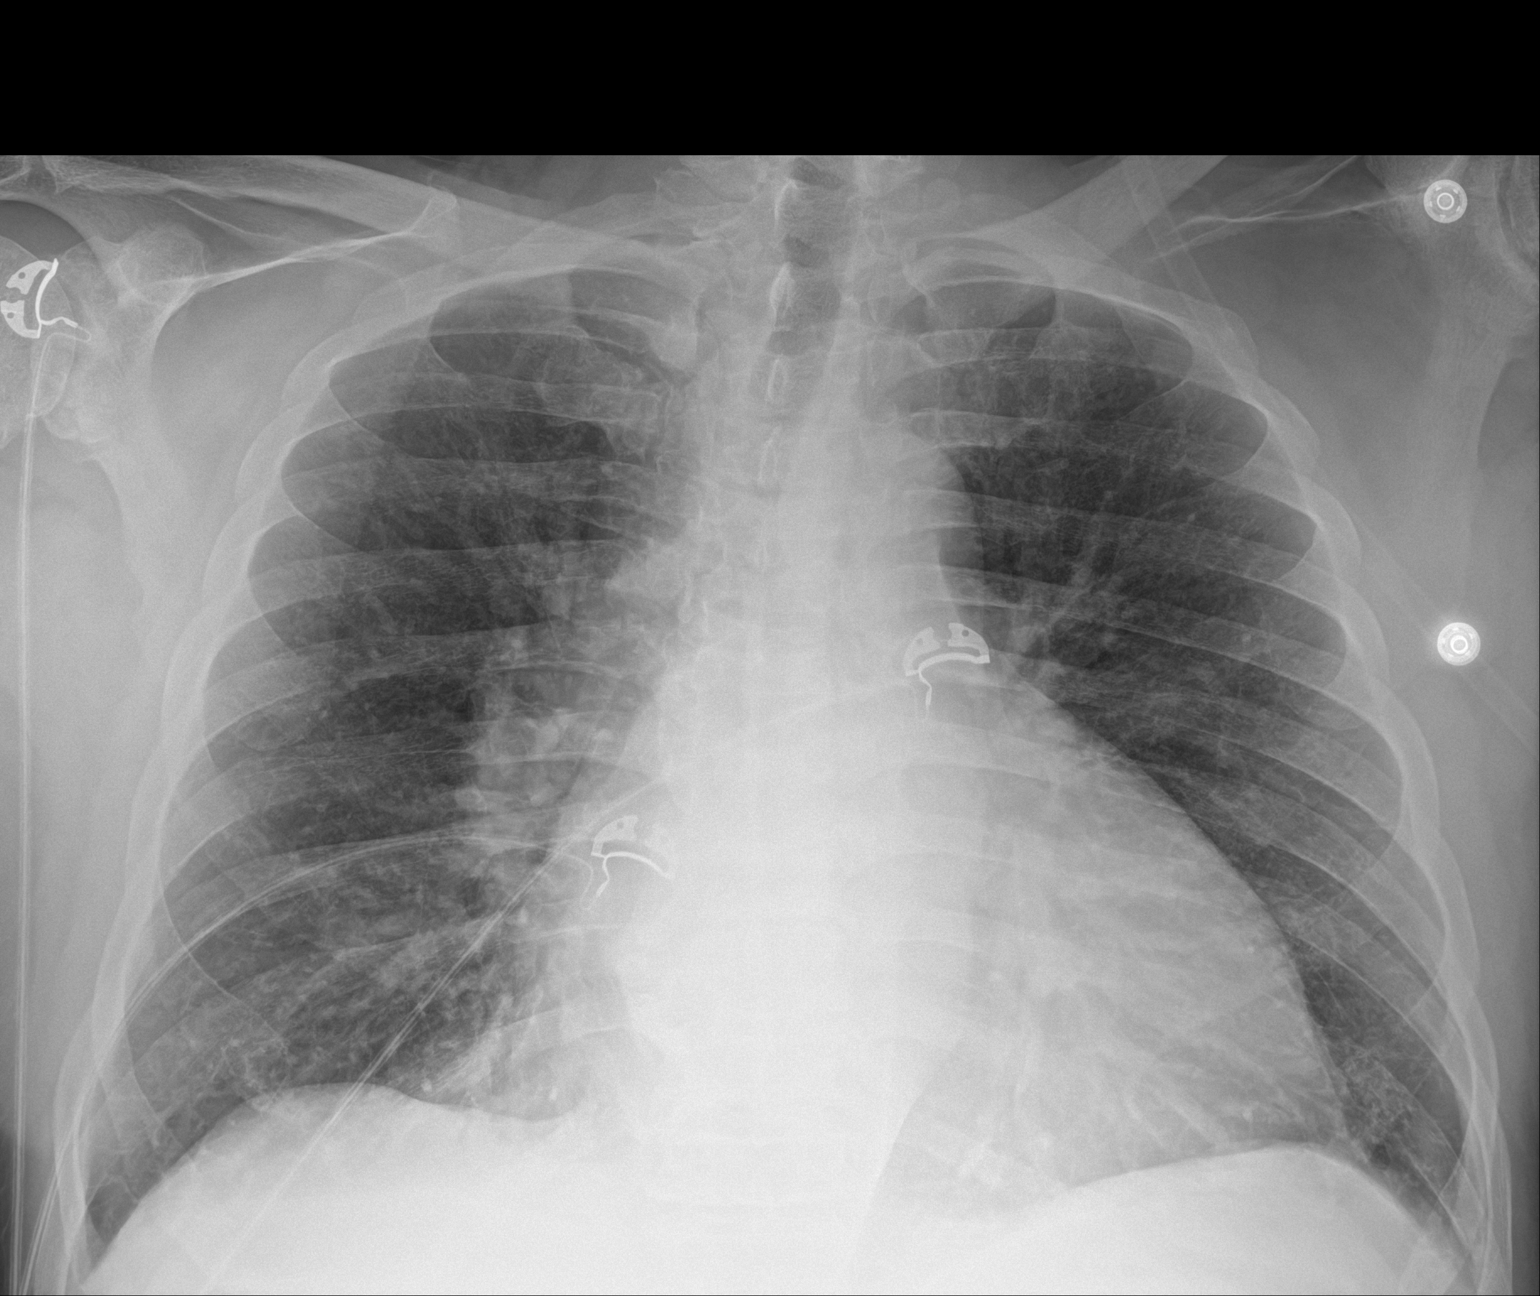

[1 of 1 positions shown; findings below may reference images not displayed]

FINDINGS: Cardiac silhouette mildly enlarged.  No mediastinal or hilar masses.

No convincing change in the opacity noted at the retrocardiac left
lung base on the previous day's study. Lungs show prominent vascular
interstitial markings that are stable, but are otherwise clear.

No pleural effusion or pneumothorax.
IMPRESSION: 1. No change from the previous day's exam. Left base opacity is
similar to the previous exam, likely atelectasis. No definite acute
cardiopulmonary disease.

## 2023-10-13 MED ORDER — HEPARIN SODIUM (PORCINE) 1000 UNIT/ML DIALYSIS
20.0000 [IU]/kg | INTRAMUSCULAR | Status: DC | PRN
  Administered 2023-10-13: 1500 [IU] via INTRAVENOUS_CENTRAL

## 2023-10-13 MED ORDER — CHLORHEXIDINE GLUCONATE CLOTH 2 % EX PADS: 6.0000 | MEDICATED_PAD | Freq: Every day | CUTANEOUS | Status: DC

## 2023-10-13 MED ORDER — HEPARIN SODIUM (PORCINE) 1000 UNIT/ML IJ SOLN
INTRAMUSCULAR | Status: AC
  Filled 2023-10-13: qty 2

## 2023-10-13 NOTE — ED Notes (Signed)
 Phleb stuck twice, wasn't able to get blood

## 2023-10-13 NOTE — ED Provider Notes (Signed)
 [place] EMERGENCY DEPARTMENT AT [facility] Provider Note   CSN: [id number] Arrival date & time: 10/13/23  9528     History  Chief Complaint  Patient presents with   needs dialysis    Paul [name] is a 58 y.o. male.  Pt with hx ESRD/HD M/W/F indicates here for dialysis as has no outpatient dialysis center.  Feels mildly sob. Last had dialysis 3 days ago. Denies chest pain or discomfort. No cough or uri symptoms. No fever or chills. Indicates used to go to outpatient dialysis on [address] but had disagreement with staff and has not been able to go back there.  The history is provided by the patient and medical records.       Home Medications Prior to Admission medications   Medication Sig Start Date End Date Taking? Authorizing Provider  ARIPiprazole  (ABILIFY ) 5 MG tablet Take 1 tablet (5 mg total) by mouth at bedtime. 09/26/23   Plovsky, Doroteo Gasmen, MD  carvedilol  (COREG ) 6.25 MG tablet Take 1 tablet (6.25 mg total) by mouth 2 (two) times daily with a meal. Patient taking differently: Take 6.25 mg by mouth daily. 07/29/23 09/23/23  Tawkaliyar, Roya, DO  isosorbide  mononitrate (IMDUR ) 30 MG 24 hr tablet Take 1 tablet (30 mg total) by mouth daily. 07/29/23   Tawkaliyar, Roya, DO  rosuvastatin  (CRESTOR ) 5 MG tablet Take 1 tablet (5 mg total) by mouth daily. 03/24/23     sucroferric oxyhydroxide (VELPHORO ) 500 MG chewable tablet Chew 3 tablets (1,500 mg total) by mouth 3 (three) times daily with meals. Patient taking differently: Chew 500 mg by mouth in the morning and at bedtime. 07/29/23   Tawkaliyar, Roya, DO      Allergies    Penicillin g    Review of Systems   Review of Systems  Constitutional:  Negative for fever.  HENT:  Negative for sore throat.   Respiratory:  Negative for cough.   Cardiovascular:  Negative for chest pain and leg swelling.  Gastrointestinal:  Negative for abdominal pain.  Genitourinary:  Negative for flank pain.  Neurological:  Negative  for headaches.    Physical Exam Updated Vital Signs BP (!) 191/102 (BP Location: Left Arm)   Pulse 99   Temp 98.2 F (36.8 C)   Resp 18   Ht 1.6 m (5\' 3" )   Wt 72.6 kg   SpO2 100%   BMI 28.34 kg/m  Physical Exam Vitals and nursing note reviewed.  Constitutional:      Appearance: Normal appearance. He is well-developed.  HENT:     Head: Atraumatic.     Nose: Nose normal.     Mouth/Throat:     Mouth: Mucous membranes are moist.     Pharynx: Oropharynx is clear.  Eyes:     General: No scleral icterus.    Conjunctiva/sclera: Conjunctivae normal.  Neck:     Trachea: No tracheal deviation.  Cardiovascular:     Rate and Rhythm: Normal rate and regular rhythm.     Pulses: Normal pulses.     Heart sounds: Normal heart sounds. No murmur heard.    No friction rub. No gallop.  Pulmonary:     Effort: Pulmonary effort is normal. No accessory muscle usage or respiratory distress.     Breath sounds: Normal breath sounds.  Abdominal:     General: There is no distension.     Palpations: Abdomen is soft.     Tenderness: There is no abdominal tenderness.  Musculoskeletal:  General: No swelling.     Cervical back: Neck supple.     Comments: RUE HD access with palpable thrill and without sign of infection.   Skin:    General: Skin is warm and dry.     Findings: No rash.  Neurological:     Mental Status: He is alert.     Comments: Alert, speech clear.   Psychiatric:        Mood and Affect: Mood normal.     ED Results / Procedures / Treatments   Labs (all labs ordered are listed, but only abnormal results are displayed) Results for orders placed or performed during the hospital encounter of 10/13/23  CBC   Collection Time: 10/13/23  8:19 AM  Result Value Ref Range   WBC 8.7 4.0 - 10.5 K/uL   RBC 2.11 (L) 4.22 - 5.81 MIL/uL   Hemoglobin 7.1 (L) 13.0 - 17.0 g/dL   HCT 62.1 (L) 30.8 - 65.7 %   MCV 102.8 (H) 80.0 - 100.0 fL   MCH 33.6 26.0 - 34.0 pg   MCHC 32.7 30.0 -  36.0 g/dL   RDW 84.6 96.2 - 95.2 %   Platelets 200 150 - 400 K/uL   nRBC 0.0 0.0 - 0.2 %  Basic metabolic panel with GFR   Collection Time: 10/13/23  8:19 AM  Result Value Ref Range   Sodium 138 135 - 145 mmol/L   Potassium 5.0 3.5 - 5.1 mmol/L   Chloride 102 98 - 111 mmol/L   CO2 20 (L) 22 - 32 mmol/L   Glucose, Bld 112 (H) 70 - 99 mg/dL   BUN 85 (H) 6 - 20 mg/dL   Creatinine, Ser 84.13 (H) 0.61 - 1.24 mg/dL   Calcium  6.6 (L) 8.9 - 10.3 mg/dL   GFR, Estimated 4 (L) >60 mL/min   Anion gap 16 (H) 5 - 15  Hepatitis B surface antigen   Collection Time: 10/13/23  9:16 AM  Result Value Ref Range   Hepatitis B Surface Ag NON REACTIVE NON REACTIVE   DG Chest 2 View Result Date: 10/08/2023 CLINICAL DATA:  Dyspnea. EXAM: CHEST - 2 VIEW COMPARISON:  Sep 10, 2023. FINDINGS: Stable cardiomediastinal silhouette. No acute pulmonary disease is noted. Bony thorax is unremarkable. IMPRESSION: No active cardiopulmonary disease. Electronically Signed   By: Rosalene Colon M.D.   On: 10/08/2023 16:47   PERIPHERAL VASCULAR CATHETERIZATION Result Date: 10/08/2023 Images from the original result were not included. Patient name: Paul [name] MRN: [id number] DOB: 09/22/1965 Sex: male 10/08/2023 Pre-operative Diagnosis: End-stage renal disease, malfunction right arm AV fistula Post-operative diagnosis:  Same Surgeon:  Sarajane Cumming. Vikki Graves, MD Procedure Performed: 1.  Percutaneous ultrasound-guided access right upper arm AV fistula 2.  Right upper extremity shuntogram 3.  Stent right cephalic arch with 8 x 50 mm Viabahn 4.  Plain balloon angioplasty right cephalic vein fistula with 7 mm balloon Indications: 58 year old male with history of end-stage renal disease dialyzing via cephalic vein fistula he has had bleeding after dialysis and has had a stitch placed with pulsatile fistula is indicated for fistulogram with possible intervention. Findings: The arteriovenous anastomosis is patent and the fistula is very healthy up  to the level of the more cephalad arm where it becomes quite sclerotic with approximately 80% stenosis which was ballooned open to less than 20% and at the arch there is a subtotal occlusion which was stented to only 20% residual stenosis and at completion there is a much improved thrill.  Fistula is okay for use today and remains amenable to future percutaneous options although he may require cephalic vein turndown versus graft in the future.  Procedure:  The patient was identified in the holding area and taken to room 8.  The patient was then placed supine on the table and prepped and draped in the usual sterile fashion.  A time out was called.  Ultrasound was used to evaluate the right arm AV fistula.  The area was anesthetized 1% lidocaine  and cannulated with a micropuncture needle followed by wire and sheath.  An ultrasound image saved the permanent record.  We performed right upper extremity fistulogram with the above findings we placed a Bentson wire followed by a 7 [place] sheath.  Kumpe catheter and Glidewire were used to cross the stenosed and subtotally occluded cephalic arch we confirmed intraluminal access and placed a Bentson wire in the right atrium.  We then performed balloon angioplasty with 7 mm balloon of the arch followed back through the sclerotic area in the fistula.  Unfortunately there remained very tight stenosis of the cephalic arch and this was stented primarily with Viabahn 8 x 50 mm and this was postdilated with a 740 mm balloon but remained with 20% residual stenosis.  At completion there was a much improved thrill in the fistula and the balloon and wire were removed.  The access site was suture-ligated with 4-0 Monocryl suture.  Patient tolerated procedure without immediate complication. Contrast: 25 cc Brandon C. Vikki Graves, MD [facility] Office: [phone] Pager: [phone]     EKG None  Radiology No results found.  Procedures Procedures     Medications Ordered in ED Medications  Chlorhexidine  Gluconate Cloth 2 % PADS 6 each (0 each Topical Hold 10/13/23 0920)    ED Course/ Medical Decision Making/ A&P                                 Medical Decision Making Problems Addressed: Chronic anemia: chronic illness or injury with exacerbation, progression, or side effects of treatment that poses a threat to life or bodily functions ESRD needing dialysis [facility]): chronic illness or injury with exacerbation, progression, or side effects of treatment that poses a threat to life or bodily functions Essential hypertension: chronic illness or injury with exacerbation, progression, or side effects of treatment that poses a threat to life or bodily functions Shortness of breath: acute illness or injury with systemic symptoms that poses a threat to life or bodily functions Uncontrolled hypertension: acute illness or injury  Amount and/or Complexity of Data Reviewed External Data Reviewed: labs and notes. Labs: ordered. Decision-making details documented in ED Course.  Risk Decision regarding hospitalization.   abs ordered/sent.   Differential diagnosis includes need for dialysis, fluid overload, hyperkalemia, etc. Dispo decision including potential need for admission considered - will get labs and reassess.   Reviewed nursing notes and prior charts for additional history. External reports reviewed.  Charts reviewed - while patient does have several case management notes in computer, unfortunately patients most pressing need is re-establishing with an outpatient dialysis center - it seems patient and nephrology/CM team need to pursue a better plan for his ongoing dialysis.   Labs reviewed/interpreted by me - ckd/esrd, k is normal. Chr anemia, w low hgb.   Nephrology consulted for dialysis - discussed pt, indicates they will arrange hospital HD as soon as able.   Recheck pt, resp status  stable from prior. No chest pain. No headache.    CRITICAL CARE RE: ESRD w sob, uncontrolled htn, requiring emergent dialysis. Performed by: Joory Gough E Phill Steck Total critical care time: 40 minutes Critical care time was exclusive of separately billable procedures and treating other patients. Critical care was necessary to treat or prevent imminent or life-threatening deterioration. Critical care was time spent personally by me on the following activities: development of treatment plan with patient and/or surrogate as well as nursing, discussions with consultants, evaluation of patient's response to treatment, examination of patient, obtaining history from patient or surrogate, ordering and performing treatments and interventions, ordering and review of laboratory studies, ordering and review of radiographic studies, pulse oximetry and re-evaluation of patient's condition.          Final Clinical Impression(s) / ED Diagnoses Final diagnoses:  ESRD needing dialysis (HCC)  Uncontrolled hypertension  Essential hypertension  Chronic anemia    Rx / DC Orders ED Discharge Orders     None         Guadalupe Lee, MD 10/13/23 1059

## 2023-10-13 NOTE — Discharge Instructions (Signed)
 It was our pleasure to provide your ER care today - we hope that you feel better.  Follow up closely with your doctor/kidney doctors in the coming week for your chronic kidney disease/dialysis needs, and importantly discussion of finding a new outpatient dialysis center, as well as for your chronic anemia, and your blood pressure that is high today.   Return to ER if worse, new symptoms, fevers, new/severe pain, chest pain, increased trouble breathing, or other concern.

## 2023-10-13 NOTE — Progress Notes (Signed)
   10/13/23 1830  Vitals  Temp 97.7 F (36.5 C)  Pulse Rate 96  Resp 19  BP (!) 156/81  SpO2 100 %  Weight  (STRETCHER)  Post Treatment  Dialyzer Clearance Lightly streaked  Hemodialysis Intake (mL) 0 mL  Liters Processed 84  Fluid Removed (mL) 2000 mL  Tolerated HD Treatment Yes  AVG/AVF Arterial Site Held (minutes) 8 minutes  AVG/AVF Venous Site Held (minutes) 8 minutes   Received patient in bed to unit.  Alert and oriented.  Informed consent signed and in chart.   TX duration:3.5HRS  Patient tolerated well.  Transported back to the room  Alert, without acute distress.  Hand-off given to patient's nurse.   Access used: RAVF Access issues: NONE  Total UF removed: 2L Medication(s) given: NONE   Na'Shaminy T Atiana Levier [facility]

## 2023-10-13 NOTE — ED Triage Notes (Signed)
 Pt complaining of a little shortness of breath for the last 2 days. Wants to get his dialysis treatment.

## 2023-10-14 LAB — HEPATITIS B SURFACE ANTIBODY, QUANTITATIVE: Hep B S AB Quant (Post): 7325 m[IU]/mL

## 2023-10-15 ENCOUNTER — Encounter (HOSPITAL_COMMUNITY): Payer: Self-pay | Admitting: Emergency Medicine

## 2023-10-15 ENCOUNTER — Emergency Department (HOSPITAL_COMMUNITY)
Admission: EM | Admit: 2023-10-15 | Discharge: 2023-10-17 | Disposition: A | Discharge status: Home / Self Care | Attending: Emergency Medicine | Admitting: Emergency Medicine

## 2023-10-15 ENCOUNTER — Other Ambulatory Visit: Payer: Self-pay

## 2023-10-15 DIAGNOSIS — N186 End stage renal disease: Secondary | ICD-10-CM | POA: Diagnosis not present

## 2023-10-15 DIAGNOSIS — Z09 Encounter for follow-up examination after completed treatment for conditions other than malignant neoplasm: Secondary | ICD-10-CM | POA: Diagnosis not present

## 2023-10-15 DIAGNOSIS — D649 Anemia, unspecified: Secondary | ICD-10-CM

## 2023-10-15 DIAGNOSIS — D631 Anemia in chronic kidney disease: Secondary | ICD-10-CM | POA: Diagnosis not present

## 2023-10-15 DIAGNOSIS — I10 Essential (primary) hypertension: Secondary | ICD-10-CM

## 2023-10-15 DIAGNOSIS — Z992 Dependence on renal dialysis: Secondary | ICD-10-CM | POA: Diagnosis not present

## 2023-10-15 DIAGNOSIS — I132 Hypertensive heart and chronic kidney disease with heart failure and with stage 5 chronic kidney disease, or end stage renal disease: Secondary | ICD-10-CM | POA: Diagnosis not present

## 2023-10-15 DIAGNOSIS — I120 Hypertensive chronic kidney disease with stage 5 chronic kidney disease or end stage renal disease: Secondary | ICD-10-CM | POA: Diagnosis not present

## 2023-10-15 DIAGNOSIS — R7989 Other specified abnormal findings of blood chemistry: Secondary | ICD-10-CM | POA: Diagnosis not present

## 2023-10-15 DIAGNOSIS — Z5321 Procedure and treatment not carried out due to patient leaving prior to being seen by health care provider: Secondary | ICD-10-CM | POA: Diagnosis not present

## 2023-10-15 DIAGNOSIS — I509 Heart failure, unspecified: Secondary | ICD-10-CM | POA: Diagnosis not present

## 2023-10-15 DIAGNOSIS — I129 Hypertensive chronic kidney disease with stage 1 through stage 4 chronic kidney disease, or unspecified chronic kidney disease: Secondary | ICD-10-CM | POA: Diagnosis not present

## 2023-10-15 LAB — CBC WITH DIFFERENTIAL/PLATELET
Abs Immature Granulocytes: 0.04 10*3/uL (ref 0.00–0.07)
Basophils Absolute: 0 10*3/uL (ref 0.0–0.1)
Basophils Relative: 1 %
Eosinophils Absolute: 0.2 10*3/uL (ref 0.0–0.5)
Eosinophils Relative: 2 %
HCT: 23.4 % — ABNORMAL LOW (ref 39.0–52.0)
Hemoglobin: 7.6 g/dL — ABNORMAL LOW (ref 13.0–17.0)
Immature Granulocytes: 1 %
Lymphocytes Relative: 14 %
Lymphs Abs: 1.2 10*3/uL (ref 0.7–4.0)
MCH: 33.6 pg (ref 26.0–34.0)
MCHC: 32.5 g/dL (ref 30.0–36.0)
MCV: 103.5 fL — ABNORMAL HIGH (ref 80.0–100.0)
Monocytes Absolute: 1 10*3/uL (ref 0.1–1.0)
Monocytes Relative: 12 %
Neutro Abs: 6 10*3/uL (ref 1.7–7.7)
Neutrophils Relative %: 70 %
Platelets: 240 10*3/uL (ref 150–400)
RBC: 2.26 MIL/uL — ABNORMAL LOW (ref 4.22–5.81)
RDW: 14.3 % (ref 11.5–15.5)
WBC: 8.4 10*3/uL (ref 4.0–10.5)
nRBC: 0 % (ref 0.0–0.2)

## 2023-10-15 LAB — BASIC METABOLIC PANEL WITH GFR
Anion gap: 17 — ABNORMAL HIGH (ref 5–15)
BUN: 55 mg/dL — ABNORMAL HIGH (ref 6–20)
CO2: 23 mmol/L (ref 22–32)
Calcium: 7.3 mg/dL — ABNORMAL LOW (ref 8.9–10.3)
Chloride: 98 mmol/L (ref 98–111)
Creatinine, Ser: 10.3 mg/dL — ABNORMAL HIGH (ref 0.61–1.24)
GFR, Estimated: 5 mL/min — ABNORMAL LOW (ref >60)
Glucose, Bld: 97 mg/dL (ref 70–99)
Potassium: 4.6 mmol/L (ref 3.5–5.1)
Sodium: 138 mmol/L (ref 135–145)

## 2023-10-15 MED ORDER — CHLORHEXIDINE GLUCONATE CLOTH 2 % EX PADS: 6.0000 | MEDICATED_PAD | Freq: Every day | CUTANEOUS | Status: DC

## 2023-10-15 MED ORDER — DARBEPOETIN ALFA 60 MCG/0.3ML IJ SOSY: 60.0000 ug | PREFILLED_SYRINGE | Freq: Once | INTRAMUSCULAR | Status: DC

## 2023-10-15 NOTE — Procedures (Signed)
 Received patient in bed to unit.  Alert and oriented.  Informed consent signed and in chart.   TX duration: 3.5 hours  Patient tolerated well.  Transported back to the ED.  Alert, without acute distress.  Hand-off given to patient's nurse.   Access used: right fistula Access issues: none  Total UF removed: 2.5 liters Medication(s) given: none  Clover Dao, RN [facility]

## 2023-10-15 NOTE — ED Notes (Signed)
 Bed was noted with pt labels and dialysis paperwork. Pt was not located by EMT-P. Charge Nurse Carolynne Citron, RN also had not seen pt.

## 2023-10-15 NOTE — ED Provider Notes (Signed)
 [place] EMERGENCY DEPARTMENT AT [facility] Provider Note   CSN: [id number] Arrival date & time: 10/15/23  4098     History  Chief Complaint  Patient presents with   Needs Dialysis    Paul [name] is a 58 y.o. male with PMHx bipolar disorder, CHF, ESRD on MWF dialysis, schizophrenia, HTN who presents to ED for dialysis treatment. Patient is well known to ED d/t his dialysis treatments. Patient declining any complaints today and states that he feels fine overall. Denies fever, chest pain, dyspnea, nausea, vomiting, diarrhea. Last dialysis session was 2 days ago.   Aaron Aas        Home Medications Prior to Admission medications   Medication Sig Start Date End Date Taking? Authorizing Provider  ARIPiprazole  (ABILIFY ) 5 MG tablet Take 1 tablet (5 mg total) by mouth at bedtime. 09/26/23   Plovsky, Doroteo Gasmen, MD  carvedilol  (COREG ) 6.25 MG tablet Take 1 tablet (6.25 mg total) by mouth 2 (two) times daily with a meal. Patient taking differently: Take 6.25 mg by mouth daily. 07/29/23 09/23/23  Tawkaliyar, Roya, DO  isosorbide  mononitrate (IMDUR ) 30 MG 24 hr tablet Take 1 tablet (30 mg total) by mouth daily. 07/29/23   Tawkaliyar, Roya, DO  rosuvastatin  (CRESTOR ) 5 MG tablet Take 1 tablet (5 mg total) by mouth daily. 03/24/23     sucroferric oxyhydroxide (VELPHORO ) 500 MG chewable tablet Chew 3 tablets (1,500 mg total) by mouth 3 (three) times daily with meals. Patient taking differently: Chew 500 mg by mouth in the morning and at bedtime. 07/29/23   Tawkaliyar, Roya, DO      Allergies    Penicillin g    Review of Systems   Review of Systems  Genitourinary:        Dialysis    Physical Exam Updated Vital Signs BP (!) 174/93 (BP Location: Right Arm)   Pulse 99   Temp 98.3 F (36.8 C)   Resp 18   Ht 5' 3 (1.6 m)   SpO2 100%   BMI 28.34 kg/m  Physical Exam Vitals and nursing note reviewed.  Constitutional:      General: He is not in acute distress.    Appearance: He  is not ill-appearing or toxic-appearing.  HENT:     Head: Normocephalic and atraumatic.     Mouth/Throat:     Mouth: Mucous membranes are moist.  Eyes:     General: No scleral icterus.       Right eye: No discharge.        Left eye: No discharge.     Conjunctiva/sclera: Conjunctivae normal.  Cardiovascular:     Rate and Rhythm: Normal rate and regular rhythm.     Pulses: Normal pulses.     Heart sounds: Normal heart sounds. No murmur heard. Pulmonary:     Effort: Pulmonary effort is normal. No respiratory distress.     Breath sounds: Normal breath sounds. No wheezing, rhonchi or rales.  Abdominal:     General: Abdomen is flat. Bowel sounds are normal.     Palpations: Abdomen is soft.  Skin:    General: Skin is warm and dry.     Findings: No rash.  Neurological:     General: No focal deficit present.     Mental Status: He is alert and oriented to person, place, and time. Mental status is at baseline.  Psychiatric:        Mood and Affect: Mood normal.        Behavior:  Behavior normal.     ED Results / Procedures / Treatments   Labs (all labs ordered are listed, but only abnormal results are displayed) Labs Reviewed  CBC WITH DIFFERENTIAL/PLATELET - Abnormal; Notable for the following components:      Result Value   RBC 2.26 (*)    Hemoglobin 7.6 (*)    HCT 23.4 (*)    MCV 103.5 (*)    All other components within normal limits  BASIC METABOLIC PANEL WITH GFR - Abnormal; Notable for the following components:   BUN 55 (*)    Creatinine, Ser 10.30 (*)    Calcium  7.3 (*)    GFR, Estimated 5 (*)    Anion gap 17 (*)    All other components within normal limits    EKG None  Radiology No results found.  Procedures .Critical Care  Performed by: Ed[place]ston Bureau, PA-C Authorized by: Daniels Bureau, PA-C   Critical care provider statement:    Critical care time (minutes):  30   Critical care was necessary to treat or prevent imminent or life-threatening  deterioration of the following conditions:  Renal failure (ESRD, uncontrolled htn, requiring emergent dialysis.)   Critical care was time spent personally by me on the following activities:  Development of treatment plan with patient or surrogate, discussions with consultants, evaluation of patient's response to treatment, examination of patient, ordering and review of laboratory studies, ordering and review of radiographic studies, ordering and performing treatments and interventions, pulse oximetry, re-evaluation of patient's condition and review of old charts   Care discussed with: admitting provider       Medications Ordered in ED Medications - No data to display  ED Course/ Medical Decision Making/ A&P                                 Medical Decision Making Amount and/or Complexity of Data Reviewed Labs: ordered.   This patient presents to the ED for concern of dialysis, this involves an extensive number of treatment options, and is a complaint that carries with it a high risk of complications and morbidity.  The differential diagnosis includes  need for dialysis, fluid overload, hyperkalemia, etc    Co morbidities that complicate the patient evaluation  bipolar disorder, CHF, ESRD on MWF dialysis, schizophrenia, HTN   Additional history obtained:  Patient with ED visit 6/9 for dialysis.    Problem List / ED Course / Critical interventions / Medication management  Patient here for dialysis. Denies any concerning symptoms today.  Physical exam reassuring.  Patient afebrile with stable vitals. I Ordered, and personally interpreted labs.  CBC with chronic anemia with hemoglobin at 7.6 today.  No leukocytosis.  BMP with elevated BUN/creatinine at patient's baseline - currently 5 5/10.30.  Mild anion gap at 17. I requested consultation with the nephrologist on-call, Dr. Edson Graces,  and discussed lab and imaging findings as well as pertinent plan - they agree to bring patient in for  dialysis. Patient is currently stable and should be appropriate for discharge after he receives his dialysis session. I have reviewed the patients home medicines and have made adjustments as needed   Social Determinants of Health:  none         Final Clinical Impression(s) / ED Diagnoses Final diagnoses:  ESRD on dialysis [facility])  Primary hypertension  Chronic anemia    Rx / DC Orders ED Discharge Orders  None         [place] Bureau, [place] 10/15/23 1610    Jerilynn Montenegro, MD 10/15/23 [phone]

## 2023-10-15 NOTE — ED Notes (Signed)
 Paul [name]

## 2023-10-15 NOTE — ED Triage Notes (Signed)
 Pt here for dialysis, no other complaints at time of triage.

## 2023-10-15 NOTE — Progress Notes (Signed)
 Met with pt at bedside once HD completed. Explained to pt that navigator contacted a local nephrologist yesterday to inquire if any local nephrologist would be willing to accept pt as a pt at local HD clinic. Navigator advised that at this time, it is unlikely that pt would be accepted by a local nephrologist that sees pt at local [facility] pt. Pt made aware of this info. Explained that once a new local clinic opens and are taking new pts, navigator could make a referral to new clinic for review. Encouraged pt to continue to go to appts with mental health provider and to continue taking prescribed meds. Explained to pt that these things are important and that his compliance with appts and meds are necessary. Pt voices understanding. Pt has limited transportation options outside of [place] (per pt). Will continue to assist as able.   Lauraine Polite Renal Navigator [phone]

## 2023-10-15 NOTE — Procedures (Signed)
 Patient was seen on dialysis and the procedure was supervised.  BFR 400  Via AVF BP is  173/107.   Patient appears to be tolerating treatment well. Aranesp  for anemia.  Paul [name] [place] 10/15/2023

## 2023-10-17 ENCOUNTER — Other Ambulatory Visit: Payer: Self-pay

## 2023-10-17 ENCOUNTER — Encounter (HOSPITAL_COMMUNITY): Payer: Self-pay | Admitting: *Deleted

## 2023-10-17 ENCOUNTER — Emergency Department (HOSPITAL_COMMUNITY)
Admission: EM | Admit: 2023-10-17 | Discharge: 2023-10-17 | Discharge status: Against Medical Advice | Source: Home / Self Care

## 2023-10-17 DIAGNOSIS — D631 Anemia in chronic kidney disease: Secondary | ICD-10-CM | POA: Insufficient documentation

## 2023-10-17 DIAGNOSIS — Z09 Encounter for follow-up examination after completed treatment for conditions other than malignant neoplasm: Secondary | ICD-10-CM | POA: Insufficient documentation

## 2023-10-17 DIAGNOSIS — I132 Hypertensive heart and chronic kidney disease with heart failure and with stage 5 chronic kidney disease, or end stage renal disease: Secondary | ICD-10-CM | POA: Insufficient documentation

## 2023-10-17 DIAGNOSIS — R7989 Other specified abnormal findings of blood chemistry: Secondary | ICD-10-CM | POA: Insufficient documentation

## 2023-10-17 DIAGNOSIS — Z992 Dependence on renal dialysis: Secondary | ICD-10-CM | POA: Insufficient documentation

## 2023-10-17 DIAGNOSIS — I120 Hypertensive chronic kidney disease with stage 5 chronic kidney disease or end stage renal disease: Secondary | ICD-10-CM | POA: Insufficient documentation

## 2023-10-17 DIAGNOSIS — Z5321 Procedure and treatment not carried out due to patient leaving prior to being seen by health care provider: Secondary | ICD-10-CM | POA: Insufficient documentation

## 2023-10-17 DIAGNOSIS — N186 End stage renal disease: Secondary | ICD-10-CM | POA: Insufficient documentation

## 2023-10-17 DIAGNOSIS — I509 Heart failure, unspecified: Secondary | ICD-10-CM | POA: Insufficient documentation

## 2023-10-17 DIAGNOSIS — I10 Essential (primary) hypertension: Secondary | ICD-10-CM

## 2023-10-17 DIAGNOSIS — I129 Hypertensive chronic kidney disease with stage 1 through stage 4 chronic kidney disease, or unspecified chronic kidney disease: Secondary | ICD-10-CM | POA: Diagnosis not present

## 2023-10-17 LAB — CBC WITH DIFFERENTIAL/PLATELET
Abs Immature Granulocytes: 0.03 10*3/uL (ref 0.00–0.07)
Basophils Absolute: 0.1 10*3/uL (ref 0.0–0.1)
Basophils Relative: 1 %
Eosinophils Absolute: 0.2 10*3/uL (ref 0.0–0.5)
Eosinophils Relative: 3 %
HCT: 23.7 % — ABNORMAL LOW (ref 39.0–52.0)
Hemoglobin: 7.8 g/dL — ABNORMAL LOW (ref 13.0–17.0)
Immature Granulocytes: 0 %
Lymphocytes Relative: 15 %
Lymphs Abs: 1.3 10*3/uL (ref 0.7–4.0)
MCH: 33.9 pg (ref 26.0–34.0)
MCHC: 32.9 g/dL (ref 30.0–36.0)
MCV: 103 fL — ABNORMAL HIGH (ref 80.0–100.0)
Monocytes Absolute: 0.9 10*3/uL (ref 0.1–1.0)
Monocytes Relative: 10 %
Neutro Abs: 6.2 10*3/uL (ref 1.7–7.7)
Neutrophils Relative %: 71 %
Platelets: 240 10*3/uL (ref 150–400)
RBC: 2.3 MIL/uL — ABNORMAL LOW (ref 4.22–5.81)
RDW: 14.4 % (ref 11.5–15.5)
WBC: 8.7 10*3/uL (ref 4.0–10.5)
nRBC: 0 % (ref 0.0–0.2)

## 2023-10-17 LAB — BASIC METABOLIC PANEL WITH GFR
Anion gap: 18 — ABNORMAL HIGH (ref 5–15)
BUN: 60 mg/dL — ABNORMAL HIGH (ref 6–20)
CO2: 20 mmol/L — ABNORMAL LOW (ref 22–32)
Calcium: 7.7 mg/dL — ABNORMAL LOW (ref 8.9–10.3)
Chloride: 100 mmol/L (ref 98–111)
Creatinine, Ser: 9.99 mg/dL — ABNORMAL HIGH (ref 0.61–1.24)
GFR, Estimated: 5 mL/min — ABNORMAL LOW (ref >60)
Glucose, Bld: 84 mg/dL (ref 70–99)
Potassium: 4.9 mmol/L (ref 3.5–5.1)
Sodium: 138 mmol/L (ref 135–145)

## 2023-10-17 MED ORDER — DARBEPOETIN ALFA 200 MCG/0.4ML IJ SOSY
200.0000 ug | PREFILLED_SYRINGE | Freq: Once | INTRAMUSCULAR | Status: AC
  Administered 2023-10-17: 200 ug via SUBCUTANEOUS
  Filled 2023-10-17: qty 0.4

## 2023-10-17 MED ORDER — CHLORHEXIDINE GLUCONATE CLOTH 2 % EX PADS: 6.0000 | MEDICATED_PAD | Freq: Every day | CUTANEOUS | Status: DC

## 2023-10-17 NOTE — ED Notes (Signed)
 Patient leaving the floor in stable condition, AOX4,  for dialysis with his belongings and staff.

## 2023-10-17 NOTE — Procedures (Signed)
 Asked to see this patient for hospital dialysis. The plan will be for ED HD. Pt is not to be admitted at this time. Pt will go to the dialysis unit when they are ready for the patient. When dialysis is completed pt will be sent back to ED for reassessment.    Last OP HD unit info --> was released 07/2023 from [facility]  B400  76.5kg  2K bath    RUA AVF  Heparin  2400    Clinical issues other than volume: 1) Anemia of esrd:  - pt dose not have a nephrologist or HD unit so does not have access to ESA or IV iron  other than when here for hospital HD sessions - recent Hb in the 7- 8 range - last 3 doses of esa were: - darbe 40 mcg 09/03/23 - darbe 40 mcg 09/10/23 - darbe 100 mcg 09/22/23 -darbe 100mcg  10/10/22 - last tsat 24% and ferritin 1828 on 09/05/23-> ferritin too high to give IV fe - plan --> will order darbe 100mcg once SQ while here today  I was present at the procedure, reviewed the HD regimen and made appropriate changes.    Wt today pre HD 82.3kg - denies sx of volume overload, no peripheral edema and lungs clear.  UF 3L today.    Adrian Alba MD [facility] Pager [phone]

## 2023-10-17 NOTE — ED Provider Notes (Signed)
 [place] EMERGENCY DEPARTMENT AT [facility] Provider Note   CSN: [id number] Arrival date & time: 10/17/23  9353     Patient presents with: Vascular Access Problem (Patient is here for his routine dialysis)   Paul [name] is a 58 y.o. male past medical history of ESRD on MWF dialysis, CHF, hypertension, schizophrenia, bipolar presents for dialysis.  Patient is well-known to this facility as he receives his dialysis treatments here.  He is without any complaints today.  No chest pain, shortness of breath, abdominal pain.  Last completed session was 6/11.   HPI     Prior to Admission medications   Medication Sig Start Date End Date Taking? Authorizing Provider  ARIPiprazole  (ABILIFY ) 5 MG tablet Take 1 tablet (5 mg total) by mouth at bedtime. 09/26/23   Plovsky, Elna, MD  carvedilol  (COREG ) 6.25 MG tablet Take 1 tablet (6.25 mg total) by mouth 2 (two) times daily with a meal. Patient taking differently: Take 6.25 mg by mouth daily. 07/29/23 09/23/23  Tawkaliyar, Roya, DO  isosorbide  mononitrate (IMDUR ) 30 MG 24 hr tablet Take 1 tablet (30 mg total) by mouth daily. 07/29/23   Tawkaliyar, Roya, DO  rosuvastatin  (CRESTOR ) 5 MG tablet Take 1 tablet (5 mg total) by mouth daily. 03/24/23     sucroferric oxyhydroxide (VELPHORO ) 500 MG chewable tablet Chew 3 tablets (1,500 mg total) by mouth 3 (three) times daily with meals. Patient taking differently: Chew 500 mg by mouth in the morning and at bedtime. 07/29/23   Tawkaliyar, Roya, DO    Allergies: Penicillin g    Review of Systems  Respiratory:  Negative for shortness of breath.     Updated Vital Signs BP (!) 145/96   Pulse 78   Temp (!) 97.2 F (36.2 C)   Resp 16   Ht 5' 3 (1.6 m)   Wt 80.3 kg Comment: standing weight  SpO2 100%   BMI 31.36 kg/m   Physical Exam Vitals and nursing note reviewed.  Constitutional:      General: He is not in acute distress.    Appearance: He is well-developed.  HENT:     Head:  Normocephalic and atraumatic.   Eyes:     Conjunctiva/sclera: Conjunctivae normal.    Cardiovascular:     Rate and Rhythm: Normal rate and regular rhythm.     Heart sounds: No murmur heard. Pulmonary:     Effort: Pulmonary effort is normal. No respiratory distress.     Breath sounds: Normal breath sounds.  Abdominal:     Palpations: Abdomen is soft.     Tenderness: There is no abdominal tenderness.   Musculoskeletal:        General: No swelling.     Cervical back: Neck supple.   Skin:    General: Skin is warm and dry.     Capillary Refill: Capillary refill takes less than 2 seconds.   Neurological:     Mental Status: He is alert.   Psychiatric:        Mood and Affect: Mood normal.     (all labs ordered are listed, but only abnormal results are displayed) Labs Reviewed  CBC WITH DIFFERENTIAL/PLATELET - Abnormal; Notable for the following components:      Result Value   RBC 2.30 (*)    Hemoglobin 7.8 (*)    HCT 23.7 (*)    MCV 103.0 (*)    All other components within normal limits  BASIC METABOLIC PANEL WITH GFR - Abnormal; Notable  for the following components:   CO2 20 (*)    BUN 60 (*)    Creatinine, Ser 9.99 (*)    Calcium  7.7 (*)    GFR, Estimated 5 (*)    Anion gap 18 (*)    All other components within normal limits    EKG: None  Radiology: No results found.   Procedures   Medications Ordered in the ED  Darbepoetin Alfa  (ARANESP ) injection 200 mcg (200 mcg Subcutaneous Given 10/17/23 1456)                                    Medical Decision Making Amount and/or Complexity of Data Reviewed Labs: ordered.   This patient presents to the ED with chief complaint(s) of dialysis .  The complaint involves an extensive differential diagnosis and also carries with it a high risk of complications and morbidity.   Pertinent past medical history as listed in HPI  The differential diagnosis includes  Fluid overload, electrolyte abnormality Additional  history obtained: Records reviewed Care Everywhere/External Records  Assessment and management:   Patient presents hypertensive for need of dialysis.  He is MWF, last completed was this past Wednesday on 6/11.  He is entirely without any symptoms.  Exam is reassuring.  Will obtain routine labs.  Independent ECG interpretation:  none  Independent labs interpretation:  The following labs were independently interpreted:  BMP with creatinine of 9.99, calcium  7.7, anion gap of 18, CO2 of 20, BUN greater than 60, CBC with hemoglobin stable at 7.8  Independent visualization and interpretation of imaging: I independently visualized the following imaging with scope of interpretation limited to determining acute life threatening conditions related to emergency care: none    Consultations obtained:   Nephrology Dr. Norine, patient cleared for dialysis  Disposition:   Patient medically cleared for dialysis   Social Determinants of Health:   none  This note was dictated with voice recognition software.  Despite best efforts at proofreading, errors may have occurred which can change the documentation meaning.       Final diagnoses:  ESRD (end stage renal disease) on dialysis [facility])  Primary hypertension    ED Discharge Orders     None          Paul Mcgranahan H, PA-C 10/22/23 0507    Paul Prentice SAUNDERS, MD 10/25/23 [phone]

## 2023-10-17 NOTE — Progress Notes (Signed)
 Patient received Hemodialysis. Goal was set for 3L. Patient was unable to achieve the goal of 3L due to cramping. Patient removed 2.3L successfully. Standing weight is 80.3kg. Patient received 200 mcg of Aranesp .

## 2023-10-17 NOTE — ED Triage Notes (Signed)
 Here for routine dialysis no other complaints , last dialysis Wed.

## 2023-10-20 ENCOUNTER — Encounter (HOSPITAL_COMMUNITY): Payer: Self-pay

## 2023-10-20 ENCOUNTER — Other Ambulatory Visit: Payer: Self-pay

## 2023-10-20 ENCOUNTER — Emergency Department (HOSPITAL_COMMUNITY)
Admission: EM | Admit: 2023-10-20 | Discharge: 2023-10-20 | Discharge status: Against Medical Advice | Attending: Emergency Medicine | Admitting: Emergency Medicine

## 2023-10-20 DIAGNOSIS — Z992 Dependence on renal dialysis: Secondary | ICD-10-CM | POA: Diagnosis not present

## 2023-10-20 DIAGNOSIS — I132 Hypertensive heart and chronic kidney disease with heart failure and with stage 5 chronic kidney disease, or end stage renal disease: Secondary | ICD-10-CM | POA: Diagnosis not present

## 2023-10-20 DIAGNOSIS — N186 End stage renal disease: Secondary | ICD-10-CM | POA: Insufficient documentation

## 2023-10-20 DIAGNOSIS — I509 Heart failure, unspecified: Secondary | ICD-10-CM | POA: Insufficient documentation

## 2023-10-20 DIAGNOSIS — Z5329 Procedure and treatment not carried out because of patient's decision for other reasons: Secondary | ICD-10-CM | POA: Diagnosis not present

## 2023-10-20 DIAGNOSIS — I120 Hypertensive chronic kidney disease with stage 5 chronic kidney disease or end stage renal disease: Secondary | ICD-10-CM | POA: Diagnosis not present

## 2023-10-20 DIAGNOSIS — Z79899 Other long term (current) drug therapy: Secondary | ICD-10-CM | POA: Insufficient documentation

## 2023-10-20 MED ORDER — ACETAMINOPHEN 325 MG PO TABS: 650.0000 mg | ORAL_TABLET | Freq: Once | ORAL | Status: DC

## 2023-10-20 MED ORDER — CHLORHEXIDINE GLUCONATE CLOTH 2 % EX PADS
6.0000 | MEDICATED_PAD | Freq: Every day | CUTANEOUS | Status: DC
Start: 2023-10-20 — End: 2023-10-20

## 2023-10-20 NOTE — ED Provider Notes (Signed)
 [name] EMERGENCY DEPARTMENT AT [facility] Provider Note   CSN: [id number] Arrival date & time: 10/20/23  5784     Patient presents with: Vascular Access Problem   Paul [name] is a 58 y.o. male  with PMHx bipolar disorder, CHF, ESRD on MWF dialysis, schizophrenia, HTN who presents to ED for dialysis treatment. Patient is well known to ED d/t his dialysis treatments. Patient declining any complaints today and states that he feels fine overall. Denies fever, chest pain, dyspnea, nausea, vomiting, diarrhea. Last dialysis session was 6/13.   HPI     Prior to Admission medications   Medication Sig Start Date End Date Taking? Authorizing Provider  ARIPiprazole  (ABILIFY ) 5 MG tablet Take 1 tablet (5 mg total) by mouth at bedtime. 09/26/23   Plovsky, Doroteo Gasmen, MD  carvedilol  (COREG ) 6.25 MG tablet Take 1 tablet (6.25 mg total) by mouth 2 (two) times daily with a meal. Patient taking differently: Take 6.25 mg by mouth daily. 07/29/23 09/23/23  Tawkaliyar, Roya, DO  isosorbide  mononitrate (IMDUR ) 30 MG 24 hr tablet Take 1 tablet (30 mg total) by mouth daily. 07/29/23   Tawkaliyar, Roya, DO  rosuvastatin  (CRESTOR ) 5 MG tablet Take 1 tablet (5 mg total) by mouth daily. 03/24/23     sucroferric oxyhydroxide (VELPHORO ) 500 MG chewable tablet Chew 3 tablets (1,500 mg total) by mouth 3 (three) times daily with meals. Patient taking differently: Chew 500 mg by mouth in the morning and at bedtime. 07/29/23   Tawkaliyar, Roya, DO    Allergies: Penicillin g    Review of Systems  Constitutional:        Dialysis    Updated Vital Signs BP (!) 172/100 (BP Location: Left Arm)   Pulse 84   Temp (!) 97.5 F (36.4 C)   Resp 16   SpO2 100%   Physical Exam Vitals and nursing note reviewed.  Constitutional:      General: He is not in acute distress.    Appearance: He is not ill-appearing or toxic-appearing.  HENT:     Head: Normocephalic and atraumatic.     Mouth/Throat:     Mouth:  Mucous membranes are moist.   Eyes:     General: No scleral icterus.       Right eye: No discharge.        Left eye: No discharge.     Conjunctiva/sclera: Conjunctivae normal.    Cardiovascular:     Rate and Rhythm: Normal rate and regular rhythm.     Pulses: Normal pulses.     Heart sounds: Normal heart sounds. No murmur heard. Pulmonary:     Effort: Pulmonary effort is normal. No respiratory distress.     Breath sounds: Normal breath sounds. No wheezing, rhonchi or rales.  Abdominal:     General: Abdomen is flat.     Palpations: Abdomen is soft.   Skin:    General: Skin is warm and dry.     Findings: No rash.   Neurological:     General: No focal deficit present.     Mental Status: He is alert and oriented to person, place, and time. Mental status is at baseline.   Psychiatric:        Mood and Affect: Mood normal.        Behavior: Behavior normal.     (all labs ordered are listed, but only abnormal results are displayed) Labs Reviewed - No data to display  EKG: None  Radiology: No results found.   Aaron Aas  Critical Care  Performed by: [place] Bureau, PA-C Authorized by: [place] Bureau, PA-C   Critical care provider statement:    Critical care time (minutes):  30   Critical care was time spent personally by me on the following activities:  Development of treatment plan with patient or surrogate, discussions with consultants, evaluation of patient's response to treatment, examination of patient, ordering and review of laboratory studies, ordering and review of radiographic studies, ordering and performing treatments and interventions, pulse oximetry, re-evaluation of patient's condition and review of old charts Comments:     ESRD on dilaysis and uncontrolled HTN requiring HD    Medications Ordered in the ED - No data to display                                  Medical Decision Making  This patient presents to the ED for concern of dialysis, this  involves an extensive number of treatment options, and is a complaint that carries with it a high risk of complications and morbidity.  The differential diagnosis includes  need for dialysis, fluid overload, hyperkalemia, etc      Co morbidities that complicate the patient evaluation   bipolar disorder, CHF, ESRD on MWF dialysis, schizophrenia, HTN     Additional history obtained:   Last dialysis session 6/13       Problem List / ED Course / Critical interventions / Medication management   Patient here for dialysis. Denies any concerning symptoms today.  Physical exam reassuring.  Patient afebrile with stable vitals. I requested consultation with the nephrologist on-call, Dr. Christianne Cowper - they agree to bring patient in for dialysis. They do not need labs in ED as patient will get these labs drawn during dialysis and patient is currently asymptomatic.  Patient is currently stable and should be appropriate for discharge after he receives his dialysis session. I have reviewed the patients home medicines and have made adjustments as needed     Social Determinants of Health:   none     Final diagnoses:  ESRD (end stage renal disease) on dialysis [facility])    ED Discharge Orders     None          [place] Bureau, PA-C 10/20/23 0818    Mozell Arias, MD 10/20/23 1505

## 2023-10-20 NOTE — Progress Notes (Signed)
 Attempted to reach pt via phone. Unable to speak to pt and unable to leave a message. Will attempt to reach pt at a later time.   Lauraine Polite Renal Navigator [phone]

## 2023-10-20 NOTE — ED Notes (Signed)
 Patient transported to dialysis

## 2023-10-20 NOTE — ED Triage Notes (Signed)
 Pt here for dialysis , denies any other symptoms. Completed full treatment on Friday.

## 2023-10-21 ENCOUNTER — Other Ambulatory Visit: Payer: Self-pay

## 2023-10-21 NOTE — Patient Outreach (Signed)
 Complex Care Management   Visit Note  10/21/2023  Name:  Paul [name] MRN: [id number] DOB: Mar 05, 1966  Situation: Referral received for Complex Care Management related to ESRD I obtained verbal consent from Patient.  Visit completed with [name]  on the phone  Background:   Past Medical History:  Diagnosis Date   Bipolar disorder [facility])    Central retinal artery occlusion    with macular infarction of the right eye. status post posterior vitrectomy and photocoagulation with insertion of a shunt in 2006.   CHF (congestive heart failure) (HCC)    chronic mixed syst/diast. 02/2009 echo: Systolic function was mildly reduced. The estimated ejection fraction was in 45%, global HK, Grade I Diast dysfxn.   Chronic kidney disease    ESRD   Heart failure    Obesity    Osteoarthritis    s/p right hip replacement in 2001   Schizophrenia (HCC)    Severe uncontrolled hypertension     Assessment: Patient Reported Symptoms:  Cognitive Cognitive Status: Able to follow simple commands, Alert and oriented to person, place, and time, Normal speech and language skills Cognitive/Intellectual Conditions Management [RPT]: None reported or documented in medical history or problem list      Neurological Neurological Review of Symptoms: Not assessed    HEENT HEENT Symptoms Reported: Not assessed      Cardiovascular Cardiovascular Symptoms Reported: Not assessed    Respiratory Respiratory Symptoms Reported: Not assesed    Endocrine Patient reports the following symptoms related to hypoglycemia or hyperglycemia : Not assessed    Gastrointestinal Gastrointestinal Symptoms Reported: Not assessed      Genitourinary Genitourinary Symptoms Reported: No symptoms reported Genitourinary Conditions: End-stage renal disease Genitourinary Management Strategies: Hemodialysis, Fluid modification Hemodialysis Schedule: M,W,F Hemodialysis Last Treatment: 10/20/23 Genitourinary Comment: Patient  has continued with dialysis treatments in the ED. Working with renal navigator to be placed into an outpatient center. Encouraged to continue to attend all treatments.  Integumentary Integumentary Symptoms Reported: Not assessed    Musculoskeletal Musculoskelatal Symptoms Reviewed: Not assessed        Psychosocial Psychosocial Symptoms Reported: No symptoms reported Additional Psychological Details: Patient has established with BH since previous CMRN visit. Next f/u scheduled for 10/28/23. He has already scheduled transportation for this appoinment. He discusses thinking about stopping f/u due to being a victim of wickedness. Confirmed that patient is taking Abilify  as scheduled daily. Behavioral Health Conditions: Other Other Behavorial Health Conditions: Schizoaffective disorder, bipolar type; Schizophrenia          09/15/2023    1:57 PM  Depression screen PHQ 2/9  Decreased Interest 0  Down, Depressed, Hopeless 0  PHQ - 2 Score 0    There were no vitals filed for this visit.  Medications Reviewed Today     Reviewed by Valaria Garland, RN (Registered Nurse) on 10/21/23 at 1408  Med List Status: <None>   Medication Order Taking? Sig Documenting Provider Last Dose Status Informant  ARIPiprazole  (ABILIFY ) 5 MG tablet [id number]  Take 1 tablet (5 mg total) by mouth at bedtime. Alba Ally, MD  Active   carvedilol  (COREG ) 6.25 MG tablet [id number]  Take 1 tablet (6.25 mg total) by mouth 2 (two) times daily with a meal.  Patient taking differently: Take 6.25 mg by mouth daily.   Lanney Pitts, DO  Expired 09/23/23 2359 Self, Pharmacy Records           Med Note Marylen Snowman Sep 08, 2023  7:50  AM)    isosorbide  mononitrate (IMDUR ) 30 MG 24 hr tablet [id number]  Take 1 tablet (30 mg total) by mouth daily. Lanney Pitts, DO  Active Self, Pharmacy Records  rosuvastatin  (CRESTOR ) 5 MG tablet [id number]  Take 1 tablet (5 mg total) by mouth daily.   Active Self,  Pharmacy Records           Med Note (CRUTHIS, CHLOE C   Wed Sep 03, 2023  7:18 AM)    sucroferric oxyhydroxide (VELPHORO ) 500 MG chewable tablet [id number]  Chew 3 tablets (1,500 mg total) by mouth 3 (three) times daily with meals.  Patient taking differently: Chew 500 mg by mouth in the morning and at bedtime.   Lanney Pitts, DO  Active Self, Pharmacy Records           Med Note (CRUTHIS, CHLOE C   Wed Sep 03, 2023  7:39 AM) Pt stated he is only taking 500 mg BID.   Med List Note Alline Ivans, CPhT 09/05/23 1324): Dialysis MWF [facility], d/c from local HD clinic due to behaviors. Pt's chart indicates that pt is not currently taking prescribed medications as prescribed by psychiatric staff.             Recommendation:   Continue Current Plan of Care Continue to follow with PCP and BH as scheduled Continue to attend all dialysis sessions and follow-up with renal navigator for placement into outpatient facility  Follow Up Plan:   Closing From:  Complex Care Management  Theodora Fish, RN MSN [place]  [facility] RN Care Manager Direct Dial : [phone]  Fax: [phone]

## 2023-10-21 NOTE — Patient Instructions (Signed)
 Visit Information  Thank you for taking time to visit with me today. Please don't hesitate to contact me if I can be of assistance to you before our next scheduled appointment.  Your next care management appointment is no further scheduled appointments.   Please call the care guide team at [phone] if you need to cancel, schedule, or reschedule an appointment.   Please call the Suicide and Crisis Lifeline: 988 call 1-800-273-TALK (toll free, 24 hour hotline) if you are experiencing a Mental Health or Behavioral Health Crisis or need someone to talk to.  Theodora Fish, RN MSN [place]  [facility] RN Care Manager Direct Dial : [phone]  Fax: [phone]

## 2023-10-22 ENCOUNTER — Emergency Department (HOSPITAL_COMMUNITY)
Admission: EM | Admit: 2023-10-22 | Discharge: 2023-10-22 | Discharge status: Against Medical Advice | Attending: Emergency Medicine | Admitting: Emergency Medicine

## 2023-10-22 ENCOUNTER — Other Ambulatory Visit: Payer: Self-pay

## 2023-10-22 DIAGNOSIS — I509 Heart failure, unspecified: Secondary | ICD-10-CM | POA: Diagnosis not present

## 2023-10-22 DIAGNOSIS — Z992 Dependence on renal dialysis: Secondary | ICD-10-CM | POA: Insufficient documentation

## 2023-10-22 DIAGNOSIS — Z5329 Procedure and treatment not carried out because of patient's decision for other reasons: Secondary | ICD-10-CM | POA: Diagnosis not present

## 2023-10-22 DIAGNOSIS — Z79899 Other long term (current) drug therapy: Secondary | ICD-10-CM | POA: Diagnosis not present

## 2023-10-22 DIAGNOSIS — N186 End stage renal disease: Secondary | ICD-10-CM | POA: Insufficient documentation

## 2023-10-22 DIAGNOSIS — I132 Hypertensive heart and chronic kidney disease with heart failure and with stage 5 chronic kidney disease, or end stage renal disease: Secondary | ICD-10-CM | POA: Diagnosis not present

## 2023-10-22 DIAGNOSIS — I120 Hypertensive chronic kidney disease with stage 5 chronic kidney disease or end stage renal disease: Secondary | ICD-10-CM | POA: Diagnosis not present

## 2023-10-22 LAB — CBC
HCT: 25 % — ABNORMAL LOW (ref 39.0–52.0)
Hemoglobin: 8 g/dL — ABNORMAL LOW (ref 13.0–17.0)
MCH: 32.8 pg (ref 26.0–34.0)
MCHC: 32 g/dL (ref 30.0–36.0)
MCV: 102.5 fL — ABNORMAL HIGH (ref 80.0–100.0)
Platelets: 268 10*3/uL (ref 150–400)
RBC: 2.44 MIL/uL — ABNORMAL LOW (ref 4.22–5.81)
RDW: 14.6 % (ref 11.5–15.5)
WBC: 7.3 10*3/uL (ref 4.0–10.5)
nRBC: 0 % (ref 0.0–0.2)

## 2023-10-22 LAB — RENAL FUNCTION PANEL
Albumin: 3.4 g/dL — ABNORMAL LOW (ref 3.5–5.0)
Anion gap: 15 (ref 5–15)
BUN: 61 mg/dL — ABNORMAL HIGH (ref 6–20)
CO2: 23 mmol/L (ref 22–32)
Calcium: 6.9 mg/dL — ABNORMAL LOW (ref 8.9–10.3)
Chloride: 97 mmol/L — ABNORMAL LOW (ref 98–111)
Creatinine, Ser: 10.58 mg/dL — ABNORMAL HIGH (ref 0.61–1.24)
GFR, Estimated: 5 mL/min — ABNORMAL LOW (ref >60)
Glucose, Bld: 98 mg/dL (ref 70–99)
Phosphorus: 8.4 mg/dL — ABNORMAL HIGH (ref 2.5–4.6)
Potassium: 4.3 mmol/L (ref 3.5–5.1)
Sodium: 135 mmol/L (ref 135–145)

## 2023-10-22 MED ORDER — PENTAFLUOROPROP-TETRAFLUOROETH EX AERO: 1.0000 | INHALATION_SPRAY | CUTANEOUS | Status: DC | PRN

## 2023-10-22 MED ORDER — LIDOCAINE-PRILOCAINE 2.5-2.5 % EX CREA: 1.0000 | TOPICAL_CREAM | CUTANEOUS | Status: DC | PRN

## 2023-10-22 MED ORDER — CHLORHEXIDINE GLUCONATE CLOTH 2 % EX PADS: 6.0000 | MEDICATED_PAD | Freq: Every day | CUTANEOUS | Status: DC

## 2023-10-22 MED ORDER — HEPARIN SODIUM (PORCINE) 1000 UNIT/ML DIALYSIS: 1000.0000 [IU] | INTRAMUSCULAR | Status: DC | PRN

## 2023-10-22 MED ORDER — ANTICOAGULANT SODIUM CITRATE 4% (200MG/5ML) IV SOLN: 5.0000 mL | Status: DC | PRN

## 2023-10-22 MED ORDER — LIDOCAINE HCL (PF) 1 % IJ SOLN: 5.0000 mL | INTRAMUSCULAR | Status: DC | PRN

## 2023-10-22 MED ORDER — ALTEPLASE 2 MG IJ SOLR: 2.0000 mg | Freq: Once | INTRAMUSCULAR | Status: DC | PRN

## 2023-10-22 MED ORDER — HEPARIN SODIUM (PORCINE) 1000 UNIT/ML IJ SOLN
3000.0000 [IU] | Freq: Once | INTRAMUSCULAR | Status: DC
  Filled 2023-10-22: qty 3

## 2023-10-22 MED ORDER — NEPRO/CARBSTEADY PO LIQD: 237.0000 mL | ORAL | Status: DC | PRN

## 2023-10-22 NOTE — ED Notes (Signed)
 Paper consent for dialysis signed by patient and witnessed by this RN.

## 2023-10-22 NOTE — ED Notes (Signed)
 Patient provided lunch bag and drink

## 2023-10-22 NOTE — ED Notes (Signed)
 Dialysis RN called this RN stating transport couldn't find patient. This RN to check patient's bed when out of patient room. Dialysis RN at bedside, patient not in assigned bed. MD and PA notified. NO IV in place. Patient elopement

## 2023-10-22 NOTE — Progress Notes (Signed)
 Patient not present for transport to take up to ER.  I went down to ER to confirm that patient had left AMA.  ER nurse also did not see patient leave due to her being in another room.  ER charge and Dialysis charge nurses informed.  Dr. Christianne Cowper informed.

## 2023-10-22 NOTE — ED Triage Notes (Signed)
 Pt arrives via POV for his routine dialysis. Dialysis schedule MWF. Last dialysis Monday. No complaints. Denies CP/SOB.

## 2023-10-22 NOTE — ED Provider Notes (Signed)
 [place] EMERGENCY DEPARTMENT AT [facility] Provider Note   CSN: [id number] Arrival date & time: 10/22/23  7829     Patient presents with: Routine Dialysis   Paul [name] is a 58 y.o. male with a history of CHF, uncontrolled hypertension, and ESRD who presents to the ED today for dialysis. Patient gets dialysis MWF, last treatment was 2 days ago, 6/16. Denies any chest pain, shortness of breath, or abdominal pain. No new complaints or concerns at this time.    Prior to Admission medications   Medication Sig Start Date End Date Taking? Authorizing Provider  ARIPiprazole  (ABILIFY ) 5 MG tablet Take 1 tablet (5 mg total) by mouth at bedtime. 09/26/23   Plovsky, Doroteo Gasmen, MD  carvedilol  (COREG ) 6.25 MG tablet Take 1 tablet (6.25 mg total) by mouth 2 (two) times daily with a meal. Patient taking differently: Take 6.25 mg by mouth daily. 07/29/23 09/23/23  Tawkaliyar, Roya, DO  isosorbide  mononitrate (IMDUR ) 30 MG 24 hr tablet Take 1 tablet (30 mg total) by mouth daily. 07/29/23   Tawkaliyar, Roya, DO  rosuvastatin  (CRESTOR ) 5 MG tablet Take 1 tablet (5 mg total) by mouth daily. 03/24/23     sucroferric oxyhydroxide (VELPHORO ) 500 MG chewable tablet Chew 3 tablets (1,500 mg total) by mouth 3 (three) times daily with meals. Patient taking differently: Chew 500 mg by mouth in the morning and at bedtime. 07/29/23   Tawkaliyar, Roya, DO    Allergies: Penicillin g    Review of Systems  Hematological:        Dialysis  All other systems reviewed and are negative.   Updated Vital Signs BP (!) 173/108 (BP Location: Left Arm)   Pulse 83   Temp 97.8 F (36.6 C) (Oral)   Resp 18   SpO2 100%   Physical Exam Vitals and nursing note reviewed.  Constitutional:      General: He is not in acute distress.    Appearance: Normal appearance.  HENT:     Head: Normocephalic and atraumatic.     Mouth/Throat:     Mouth: Mucous membranes are moist.   Eyes:     Conjunctiva/sclera:  Conjunctivae normal.     Pupils: Pupils are equal, round, and reactive to light.    Cardiovascular:     Rate and Rhythm: Normal rate and regular rhythm.     Pulses: Normal pulses.     Heart sounds: Normal heart sounds.  Pulmonary:     Effort: Pulmonary effort is normal.     Breath sounds: Normal breath sounds.  Abdominal:     Palpations: Abdomen is soft.     Tenderness: There is no abdominal tenderness.   Musculoskeletal:        General: Normal range of motion.     Cervical back: Normal range of motion.   Skin:    General: Skin is warm and dry.     Findings: No rash.   Neurological:     General: No focal deficit present.     Mental Status: He is alert.   Psychiatric:        Mood and Affect: Mood normal.        Behavior: Behavior normal.    (all labs ordered are listed, but only abnormal results are displayed) Labs Reviewed  RENAL FUNCTION PANEL - Abnormal; Notable for the following components:      Result Value   Chloride 97 (*)    BUN 61 (*)    Creatinine, Ser 10.58 (*)  Calcium  6.9 (*)    Phosphorus 8.4 (*)    Albumin 3.4 (*)    GFR, Estimated 5 (*)    All other components within normal limits  CBC - Abnormal; Notable for the following components:   RBC 2.44 (*)    Hemoglobin 8.0 (*)    HCT 25.0 (*)    MCV 102.5 (*)    All other components within normal limits    EKG: None  Radiology: No results found.   Procedures   Medications Ordered in the ED  Chlorhexidine  Gluconate Cloth 2 % PADS 6 each (0 each Topical Hold 10/22/23 0847)  pentafluoroprop-tetrafluoroeth (GEBAUERS) aerosol 1 Application (has no administration in time range)  lidocaine  (PF) (XYLOCAINE ) 1 % injection 5 mL (has no administration in time range)  lidocaine -prilocaine  (EMLA ) cream 1 Application (has no administration in time range)  heparin  injection 1,000 Units (has no administration in time range)  anticoagulant sodium citrate  solution 5 mL (has no administration in time range)   alteplase  (CATHFLO ACTIVASE ) injection 2 mg (has no administration in time range)  feeding supplement (NEPRO CARB STEADY) liquid 237 mL (has no administration in time range)  heparin  sodium (porcine) injection 3,000 Units (0 Units Intravenous Hold 10/22/23 1118)                                    Medical Decision Making  This patient presents to the ED for concern of dialysis, this involves an extensive number of treatment options, and is a complaint that carries with it a high risk of complications and morbidity.    Comorbidities  See HPI above   Additional History  Additional history obtained from prior ED visits   Lab Tests  Renal function panel and CBC ordered by nephrology and within normal limits for patient   Consultations  I requested consultation with Dr. Christianne Cowper with nephrology for ESRD,  and discussed lab and imaging findings as well as pertinent plan - they recommend: dialysis treatment today   Problem List / ED Course / Critical Interventions / Medication Management  Patient came to the ED today for his routine MWF dialysis treatment. Last treatment was 2 days ago, as scheduled. No CP or SOB. No concerns or complaints at this time.   Social Determinants of Health  Access to healthcare   Test / Admission - Considered  Patient eloped prior to dialysis treatment.    Final diagnoses:  Hemodialysis patient [facility])    ED Discharge Orders     None          Sonnie Dusky, PA-C 10/22/23 1317    Russella Courts A, DO 10/23/23 1543

## 2023-10-24 ENCOUNTER — Other Ambulatory Visit: Payer: Self-pay | Admitting: Internal Medicine

## 2023-10-24 ENCOUNTER — Encounter (HOSPITAL_COMMUNITY): Payer: Self-pay

## 2023-10-24 ENCOUNTER — Other Ambulatory Visit: Payer: Self-pay

## 2023-10-24 ENCOUNTER — Other Ambulatory Visit (HOSPITAL_COMMUNITY): Payer: Self-pay

## 2023-10-24 ENCOUNTER — Emergency Department (HOSPITAL_COMMUNITY)
Admission: EM | Admit: 2023-10-24 | Discharge: 2023-10-24 | Discharge status: Against Medical Advice | Attending: Emergency Medicine | Admitting: Emergency Medicine

## 2023-10-24 DIAGNOSIS — I132 Hypertensive heart and chronic kidney disease with heart failure and with stage 5 chronic kidney disease, or end stage renal disease: Secondary | ICD-10-CM | POA: Diagnosis not present

## 2023-10-24 DIAGNOSIS — I120 Hypertensive chronic kidney disease with stage 5 chronic kidney disease or end stage renal disease: Secondary | ICD-10-CM | POA: Diagnosis not present

## 2023-10-24 DIAGNOSIS — I5040 Unspecified combined systolic (congestive) and diastolic (congestive) heart failure: Secondary | ICD-10-CM | POA: Insufficient documentation

## 2023-10-24 DIAGNOSIS — N186 End stage renal disease: Secondary | ICD-10-CM | POA: Diagnosis not present

## 2023-10-24 DIAGNOSIS — Z79899 Other long term (current) drug therapy: Secondary | ICD-10-CM | POA: Insufficient documentation

## 2023-10-24 DIAGNOSIS — Z96643 Presence of artificial hip joint, bilateral: Secondary | ICD-10-CM | POA: Insufficient documentation

## 2023-10-24 DIAGNOSIS — Z87891 Personal history of nicotine dependence: Secondary | ICD-10-CM | POA: Insufficient documentation

## 2023-10-24 DIAGNOSIS — D649 Anemia, unspecified: Secondary | ICD-10-CM | POA: Insufficient documentation

## 2023-10-24 DIAGNOSIS — Z992 Dependence on renal dialysis: Secondary | ICD-10-CM | POA: Insufficient documentation

## 2023-10-24 DIAGNOSIS — I10 Essential (primary) hypertension: Secondary | ICD-10-CM

## 2023-10-24 LAB — CBC
HCT: 25.4 % — ABNORMAL LOW (ref 39.0–52.0)
Hemoglobin: 8.2 g/dL — ABNORMAL LOW (ref 13.0–17.0)
MCH: 33.1 pg (ref 26.0–34.0)
MCHC: 32.3 g/dL (ref 30.0–36.0)
MCV: 102.4 fL — ABNORMAL HIGH (ref 80.0–100.0)
Platelets: 296 10*3/uL (ref 150–400)
RBC: 2.48 MIL/uL — ABNORMAL LOW (ref 4.22–5.81)
RDW: 14.6 % (ref 11.5–15.5)
WBC: 7 10*3/uL (ref 4.0–10.5)
nRBC: 0 % (ref 0.0–0.2)

## 2023-10-24 LAB — BASIC METABOLIC PANEL WITH GFR
Anion gap: 21 — ABNORMAL HIGH (ref 5–15)
BUN: 86 mg/dL — ABNORMAL HIGH (ref 6–20)
CO2: 17 mmol/L — ABNORMAL LOW (ref 22–32)
Calcium: 7 mg/dL — ABNORMAL LOW (ref 8.9–10.3)
Chloride: 103 mmol/L (ref 98–111)
Creatinine, Ser: 13.51 mg/dL — ABNORMAL HIGH (ref 0.61–1.24)
GFR, Estimated: 4 mL/min — ABNORMAL LOW (ref >60)
Glucose, Bld: 86 mg/dL (ref 70–99)
Potassium: 4.8 mmol/L (ref 3.5–5.1)
Sodium: 141 mmol/L (ref 135–145)

## 2023-10-24 MED ORDER — HEPARIN SODIUM (PORCINE) 1000 UNIT/ML IJ SOLN
INTRAMUSCULAR | Status: AC
Start: 2023-10-24 — End: 2023-10-24
  Filled 2023-10-24: qty 3

## 2023-10-24 MED ORDER — CHLORHEXIDINE GLUCONATE CLOTH 2 % EX PADS: 6.0000 | MEDICATED_PAD | Freq: Every day | CUTANEOUS | Status: DC

## 2023-10-24 NOTE — Discharge Instructions (Signed)
 It was a pleasure caring for you today in the emergency department.  Please return to the emergency department for any worsening or worrisome symptoms.

## 2023-10-24 NOTE — ED Notes (Signed)
 Pt resting with eyes closed.

## 2023-10-24 NOTE — ED Notes (Signed)
  Patient returned from dialysis and promptly got out of stretcher and started walking towards exit.  Asked patient if he was leaving and he stated yes.  Asked if I could get some discharge vitals and he refused.
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
## 2023-10-24 NOTE — ED Notes (Signed)
 Transported to dialysis.

## 2023-10-24 NOTE — ED Triage Notes (Signed)
 M, W, F Dialysis . Says he left early Wednesday and missed that day.   Denies any cp. Shob.

## 2023-10-24 NOTE — ED Provider Notes (Signed)
 Patient left before I could evaluate him after his dialysis.   Jerilynn Montenegro, MD 10/24/23 [phone]

## 2023-10-24 NOTE — Progress Notes (Signed)
 Met with pt at bedside while receiving HD. Pt confirms that he continues to take meds as prescribed and has an appt for Tuesday, June 24 to f/u with mental health provider. Pt plans to attend appt and has made arrangements for transportation to/from appt on Tuesday. Asked pt additional questions regarding transportation provider. Pt states he uses [employer] and thinks that is through his medicaid. If that is the case, then navigator could attempt referral to HD clinics in [facility] since that is still [facility]. Discussed with pt the option of referral being made to [facility] for a clinic in [facility]. Pt agreeable to this option and gave navigator permission to make referral on Monday. Pt's compliance with meds and mental health appts will be a positive factor when referral is made. Encouraged pt to continue taking prescribed meds and going to f/u appts as he has been doing as of recent. Pt knows he will need to continue coming to ED for assessment until a new clinic can be located. Will make plans to make referral to [facility] once new clinic in [place] is able to take new pts. Will assist as needed.   Lauraine Polite Renal Navigator [phone]

## 2023-10-24 NOTE — Progress Notes (Signed)
   10/24/23 1829  Vitals  Temp 97.7 F (36.5 C)  Pulse Rate 86  Resp 18  BP (!) 174/94  SpO2 100 %  O2 Device Room Air  Weight 81.8 kg  Type of Weight Actual  Oxygen Therapy  Patient Activity (if Appropriate) In bed  Pulse Oximetry Type Continuous  Oximetry Probe Site Changed No  Post Treatment  Dialyzer Clearance Lightly streaked  Hemodialysis Intake (mL) 0 mL  Liters Processed 77.9  Fluid Removed (mL) 1800 mL  Tolerated HD Treatment Yes  AVG/AVF Arterial Site Held (minutes) 5 minutes  AVG/AVF Venous Site Held (minutes) 5 minutes   Pt requested to end treatment 10 minutes early. Pt signed AMA form.   Treatment duration: Three hours and twenty minutes.

## 2023-10-25 ENCOUNTER — Other Ambulatory Visit (HOSPITAL_COMMUNITY): Payer: Self-pay

## 2023-10-25 MED ORDER — ISOSORBIDE MONONITRATE ER 30 MG PO TB24
30.0000 mg | ORAL_TABLET | Freq: Every day | ORAL | 6 refills | Status: DC
  Filled 2023-10-25: qty 30, 30d supply, fill #0
  Filled 2024-01-29: qty 30, 30d supply, fill #1
  Filled 2024-03-18: qty 30, 30d supply, fill #2

## 2023-10-27 ENCOUNTER — Encounter (HOSPITAL_COMMUNITY): Payer: Self-pay

## 2023-10-27 ENCOUNTER — Other Ambulatory Visit: Payer: Self-pay

## 2023-10-27 ENCOUNTER — Emergency Department (HOSPITAL_COMMUNITY)
Admission: EM | Admit: 2023-10-27 | Discharge: 2023-10-27 | Discharge status: Against Medical Advice | Attending: Emergency Medicine | Admitting: Emergency Medicine

## 2023-10-27 DIAGNOSIS — N185 Chronic kidney disease, stage 5: Secondary | ICD-10-CM | POA: Diagnosis not present

## 2023-10-27 DIAGNOSIS — Z992 Dependence on renal dialysis: Secondary | ICD-10-CM | POA: Insufficient documentation

## 2023-10-27 DIAGNOSIS — Z5329 Procedure and treatment not carried out because of patient's decision for other reasons: Secondary | ICD-10-CM | POA: Insufficient documentation

## 2023-10-27 DIAGNOSIS — I120 Hypertensive chronic kidney disease with stage 5 chronic kidney disease or end stage renal disease: Secondary | ICD-10-CM | POA: Diagnosis not present

## 2023-10-27 LAB — CBC WITH DIFFERENTIAL/PLATELET
Abs Immature Granulocytes: 0.03 10*3/uL (ref 0.00–0.07)
Basophils Absolute: 0.1 10*3/uL (ref 0.0–0.1)
Basophils Relative: 1 %
Eosinophils Absolute: 0.2 10*3/uL (ref 0.0–0.5)
Eosinophils Relative: 3 %
HCT: 25.1 % — ABNORMAL LOW (ref 39.0–52.0)
Hemoglobin: 8.1 g/dL — ABNORMAL LOW (ref 13.0–17.0)
Immature Granulocytes: 1 %
Lymphocytes Relative: 18 %
Lymphs Abs: 1.2 10*3/uL (ref 0.7–4.0)
MCH: 32.4 pg (ref 26.0–34.0)
MCHC: 32.3 g/dL (ref 30.0–36.0)
MCV: 100.4 fL — ABNORMAL HIGH (ref 80.0–100.0)
Monocytes Absolute: 0.7 10*3/uL (ref 0.1–1.0)
Monocytes Relative: 11 %
Neutro Abs: 4.5 10*3/uL (ref 1.7–7.7)
Neutrophils Relative %: 66 %
Platelets: 274 10*3/uL (ref 150–400)
RBC: 2.5 MIL/uL — ABNORMAL LOW (ref 4.22–5.81)
RDW: 14.6 % (ref 11.5–15.5)
WBC: 6.7 10*3/uL (ref 4.0–10.5)
nRBC: 0 % (ref 0.0–0.2)

## 2023-10-27 LAB — BASIC METABOLIC PANEL WITH GFR
Anion gap: 18 — ABNORMAL HIGH (ref 5–15)
BUN: 80 mg/dL — ABNORMAL HIGH (ref 6–20)
CO2: 21 mmol/L — ABNORMAL LOW (ref 22–32)
Calcium: 7.2 mg/dL — ABNORMAL LOW (ref 8.9–10.3)
Chloride: 98 mmol/L (ref 98–111)
Creatinine, Ser: 12.88 mg/dL — ABNORMAL HIGH (ref 0.61–1.24)
GFR, Estimated: 4 mL/min — ABNORMAL LOW (ref >60)
Glucose, Bld: 98 mg/dL (ref 70–99)
Potassium: 4.3 mmol/L (ref 3.5–5.1)
Sodium: 137 mmol/L (ref 135–145)

## 2023-10-27 MED ORDER — ALTEPLASE 2 MG IJ SOLR: 2.0000 mg | Freq: Once | INTRAMUSCULAR | Status: DC | PRN

## 2023-10-27 MED ORDER — PENTAFLUOROPROP-TETRAFLUOROETH EX AERO: 1.0000 | INHALATION_SPRAY | CUTANEOUS | Status: DC | PRN

## 2023-10-27 MED ORDER — CHLORHEXIDINE GLUCONATE CLOTH 2 % EX PADS: 6.0000 | MEDICATED_PAD | Freq: Every day | CUTANEOUS | Status: DC

## 2023-10-27 MED ORDER — ANTICOAGULANT SODIUM CITRATE 4% (200MG/5ML) IV SOLN: 5.0000 mL | Status: DC | PRN

## 2023-10-27 MED ORDER — HEPARIN SODIUM (PORCINE) 1000 UNIT/ML DIALYSIS
2400.0000 [IU] | Freq: Once | INTRAMUSCULAR | Status: AC
  Administered 2023-10-27: 2400 [IU] via INTRAVENOUS_CENTRAL
  Filled 2023-10-27: qty 3

## 2023-10-27 MED ORDER — NEPRO/CARBSTEADY PO LIQD: 237.0000 mL | ORAL | Status: DC | PRN

## 2023-10-27 MED ORDER — HEPARIN SODIUM (PORCINE) 1000 UNIT/ML IJ SOLN
INTRAMUSCULAR | Status: AC
  Filled 2023-10-27: qty 3

## 2023-10-27 MED ORDER — LIDOCAINE-PRILOCAINE 2.5-2.5 % EX CREA: 1.0000 | TOPICAL_CREAM | CUTANEOUS | Status: DC | PRN

## 2023-10-27 MED ORDER — HEPARIN SODIUM (PORCINE) 1000 UNIT/ML DIALYSIS: 1000.0000 [IU] | INTRAMUSCULAR | Status: DC | PRN

## 2023-10-27 MED ORDER — LIDOCAINE HCL (PF) 1 % IJ SOLN: 5.0000 mL | INTRAMUSCULAR | Status: DC | PRN

## 2023-10-27 NOTE — Progress Notes (Addendum)
 Asked to see this patient for hospital dialysis. Pt was discharged from his outpatient unit about in March 2025 for behavior issues. The plan will be for ED HD. Pt is not to be admitted at this time. Pt will go to the dialysis unit when they are ready for the patient. When dialysis is completed pt will be sent back to ED for reassessment.      Last OP HD unit info --> just released 07/2023 from [facility] 4h  B400  76.5kg  2K bath    RUA AVF  Heparin  2400    Clinical issues other than volume: 1) Anemia of esrd:  - pt dose not have a nephrologist or HD unit so does not have access to ESA or IV iron  other than when here for hospital HD sessions - last 3 doses of esa were: - darbe 100 mcg 09/22/23 - darbe 100 mcg 10/10/23 - darbe 200 mcg 10/17/23   - last tsat 24% on 09/05/23, ferritin 1828 - last 2 Hb's --> 8.0 and 8.2    Plan - darbe dose ^'d to 200mcg on 6/13 - not candidate for IV fe due to ^^ferritin - next esa dose due this week  Myer Fret  MD  [facility] 10/27/2023, 5:42 PM

## 2023-10-27 NOTE — ED Triage Notes (Signed)
 Pt presents to the ER for dialysis. Has dialysis on M,W,F.  Denies any shortness of breath or chest pain.

## 2023-10-27 NOTE — ED Provider Notes (Signed)
  [place] EMERGENCY DEPARTMENT AT [facility] Provider Note   CSN: [id number] Arrival date & time: 10/27/23  9372     Patient presents with: needs dialysis   Paul [name] is a 58 y.o. male.   Patient is here for dialysis.  He denies any complaints.  His last dialysis appears to have been 6/18.  He denies experiencing any shortness of breath he is not having any chest pain.  Patient came in on 6/20 but left before dialysis.  The history is provided by the patient. No language interpreter was used.       Prior to Admission medications   Medication Sig Start Date End Date Taking? Authorizing Provider  ARIPiprazole  (ABILIFY ) 5 MG tablet Take 1 tablet (5 mg total) by mouth at bedtime. 09/26/23   Plovsky, Elna, MD  carvedilol  (COREG ) 6.25 MG tablet Take 1 tablet (6.25 mg total) by mouth 2 (two) times daily with a meal. Patient taking differently: Take 6.25 mg by mouth daily. 07/29/23 09/23/23  Tawkaliyar, Roya, DO  isosorbide  mononitrate (IMDUR ) 30 MG 24 hr tablet Take 1 tablet (30 mg total) by mouth daily. 10/25/23   [name], MD  rosuvastatin  (CRESTOR ) 5 MG tablet Take 1 tablet (5 mg total) by mouth daily. 03/24/23     sucroferric oxyhydroxide (VELPHORO ) 500 MG chewable tablet Chew 3 tablets (1,500 mg total) by mouth 3 (three) times daily with meals. Patient taking differently: Chew 500 mg by mouth in the morning and at bedtime. 07/29/23   Tawkaliyar, Roya, DO    Allergies: Penicillin g    Review of Systems  All other systems reviewed and are negative.   Updated Vital Signs BP (!) 166/104 (BP Location: Left Arm)   Pulse 86   Temp 97.8 F (36.6 C)   Resp 18   Ht 5' 3 (1.6 m)   Wt 80.7 kg   SpO2 100%   BMI 31.53 kg/m   Physical Exam Vitals reviewed.  Constitutional:      Appearance: Normal appearance.  HENT:     Nose: Nose normal.     Mouth/Throat:     Mouth: Mucous membranes are moist.   Cardiovascular:     Rate and Rhythm: Normal rate.   Pulmonary:     Effort: Pulmonary effort is normal.   Neurological:     General: No focal deficit present.     Mental Status: He is alert.     (all labs ordered are listed, but only abnormal results are displayed) Labs Reviewed - No data to display  EKG: None  Radiology: No results found.   Procedures   Medications Ordered in the ED - No data to display                                  Medical Decision Making Pt is here for dialysis.    Amount and/or Complexity of Data Reviewed Labs: ordered.    Details: Labs ordered reviewed and interpreted  Discussion of management or test interpretation with external provider(s): Dr. Susannah nephrology notified that patient is here and needs dialysis        Final diagnoses:  Stage 5 chronic kidney disease on chronic dialysis [facility])    ED Discharge Orders     None          Flint Sonny POUR, PA-C 10/27/23 9257    Lenor Hollering, MD 10/27/23 1153

## 2023-10-27 NOTE — Progress Notes (Signed)
 Received patient in bed to unit.  Alert and oriented.  Informed consent signed and in chart.   TX duration:3.5 hours  Patient tolerated well.  Transported back to the room  Alert, without acute distress.  Hand-off given to patient's nurse.   Access used: Right AV Fistula Upper ARm Access issues: none  Total UF removed: 1.8L Medication(s) given: none   10/27/23 1625  Vitals  Temp 97.8 F (36.6 C)  Temp Source Oral  BP (!) 169/97  MAP (mmHg) 116  BP Location Left Arm  BP Method Automatic  Patient Position (if appropriate) Lying  Pulse Rate 76  Pulse Rate Source Monitor  ECG Heart Rate 80  Resp 16  Oxygen Therapy  SpO2 100 %  O2 Device Room Air  During Treatment Monitoring  Duration of HD Treatment -hour(s) 3.5 hour(s)  HD Safety Checks Performed Yes  Intra-Hemodialysis Comments Tx completed  Dialysis Fluid Bolus Normal Saline  Bolus Amount (mL) 300 mL  Post Treatment  Dialyzer Clearance Clear  Liters Processed 84  Fluid Removed (mL) 1800 mL  Tolerated HD Treatment Yes  Post-Hemodialysis Comments Patient had cramping in the last of session.  AVG/AVF Arterial Site Held (minutes) 7 minutes  AVG/AVF Venous Site Held (minutes) 7 minutes  Fistula / Graft Right Upper arm Arteriovenous fistula  No placement date or time found.   Placed prior to admission: Yes  Orientation: Right  Access Location: Upper arm  Access Type: Arteriovenous fistula  Status Deaccessed     Camellia Brasil LPN Kidney Dialysis Unit

## 2023-10-27 NOTE — Procedures (Signed)
 I was present at the procedure, reviewed the HD regimen and made appropriate changes.   Myer Fret MD  [facility] 10/27/2023, 5:42 PM

## 2023-10-27 NOTE — Progress Notes (Signed)
 Referral submitted to [facility] out-pt HD with request for [facility] as discussed with pt on Friday. Will await determination.   Randine Mungo Renal Navigator [phone]   Addendum at 3:10 pm on 10/28/23: Referral to [facility] is still pending per Luke.

## 2023-10-27 NOTE — ED Notes (Signed)
To Dialysis now 

## 2023-10-28 ENCOUNTER — Encounter (HOSPITAL_COMMUNITY): Payer: Self-pay | Admitting: Psychiatry

## 2023-10-28 ENCOUNTER — Other Ambulatory Visit: Payer: Self-pay

## 2023-10-28 ENCOUNTER — Ambulatory Visit (HOSPITAL_BASED_OUTPATIENT_CLINIC_OR_DEPARTMENT_OTHER): Admitting: Psychiatry

## 2023-10-28 VITALS — BP 191/87 | HR 94 | Ht 63.0 in | Wt 180.0 lb

## 2023-10-28 DIAGNOSIS — F22 Delusional disorders: Secondary | ICD-10-CM | POA: Diagnosis not present

## 2023-10-28 MED ORDER — ARIPIPRAZOLE 5 MG PO TABS
5.0000 mg | ORAL_TABLET | Freq: Every day | ORAL | 3 refills | Status: DC
  Filled 2023-10-28: qty 30, 30d supply, fill #0
  Filled 2023-12-12: qty 30, 30d supply, fill #1

## 2023-10-28 MED ORDER — ARIPIPRAZOLE 5 MG PO TABS
5.0000 mg | ORAL_TABLET | Freq: Every day | ORAL | 3 refills | Status: DC
  Filled 2023-10-28: qty 30, 30d supply, fill #0

## 2023-10-28 NOTE — Progress Notes (Signed)
 Psychiatric Initial Adult Assessment   Patient Identification: Paul [name] MRN:  [id number] Date of Evaluation:  10/28/2023 Referral Source: Barnie Louder Chief Complaint: I cannot think straight No chief complaint on file.  Visit Diagnosis: Schizoaffective disorder  History of Present Illness:    Today the patient is stable.  To be clear he still has generally negative feelings about women and thinks that women have caused illness.  But is nothing against any particular remote woman except for his ex-wife and his sister.  He describes him as being witches.  But he does not want to hurt anybody.  He has not renal navigator named Randine he likes her a lot.  Generally he is not aggressive and wants to hurt anybody.  At this time he denies any homicidal thinking.  He has not been in any fights.  He drinks no alcohol and uses no drugs.  He says he has been taking the Abilify  every night at 9:00.  He is poorly controlled hypertension.  He goes to dialysis 3 days a week.  He takes 4 hours when he goes.  The patient seems to be calm and claims the Abilify  helps him.  I am not clear if it makes a big difference with delusional thinking but I do not think the patient is overtly aggressive at this time.  Noted he says a history of brain injury.  The patient wishes to talk more but today we focused on just a few topics mainly compliancy.  I offered him the possibility of having Abilify  injections once a month but he declined.  Abilify  is safe for a person with renal failure. Depression Symptoms:  depressed mood, (Hypo) Manic Symptoms:   Anxiety Symptoms:   Psychotic Symptoms:   PTSD Symptoms: Negative Past Psychiatric History: 3 psychiatric hospitalizations  Previous Psychotropic Medications: Yes   Substance Abuse History in the last 12 months:  Yes.    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 status post posterior vitrectomy and photocoagulation with insertion of a shunt in 2006.   CHF (congestive heart failure) (HCC)    chronic mixed syst/diast. 02/2009 echo: Systolic function was mildly reduced. The estimated ejection fraction was in 45%, global HK, Grade I Diast dysfxn.   Chronic kidney disease    ESRD   Heart failure    Obesity    Osteoarthritis    s/p right hip replacement in 2001   Schizophrenia (HCC)    Severe uncontrolled hypertension     Past Surgical History:  Procedure Laterality Date   A/V SHUNT INTERVENTION N/A 10/08/2023   Procedure: A/V SHUNT INTERVENTION;  Surgeon: Sheree Penne Bruckner, MD;  Location: [facility];  Service: Cardiovascular;  Laterality: N/A;   AV FISTULA PLACEMENT Right 07/08/2022   Procedure: RIGHT RADIOCEPHALIC ARTERIOVENOUS (AV) FISTULA CREATION;  Surgeon: Lanis Fonda BRAVO, MD;  Location: [facility] OR;  Service: Vascular;  Laterality: Right;   IR FLUORO GUIDE CV LINE RIGHT  05/13/2022   IR US  GUIDE VASC ACCESS RIGHT  05/13/2022   JOINT REPLACEMENT     right hip replacement   right eye surgery     for glaucoma   TOTAL HIP ARTHROPLASTY Bilateral 1997, 2001   UPPER EXTREMITY INTERVENTION  10/08/2023   Procedure: UPPER EXTREMITY INTERVENTION;  Surgeon: Sheree Penne Bruckner, MD;  Location: [facility];  Service: Cardiovascular;;  Stent   VENOUS ANGIOPLASTY  10/08/2023   Procedure: VENOUS ANGIOPLASTY;  Surgeon: Sheree Penne Bruckner, MD;  Location: [facility];  Service: Cardiovascular;;    Family Psychiatric History:   Family History:  Family History  Problem Relation Age of Onset   Hypertension Father    Stroke Father     Social History:   Social History   Socioeconomic History   Marital status: Legally Separated    Spouse name: Not on file   Number of children: Not on file   Years of education: Not on file   Highest education level: Not on file  Occupational  History   Occupation: Chartered certified accountant  Tobacco Use   Smoking status: Former   Smokeless tobacco: Never   Tobacco comments:    Quit smoking 20-30 years ago, smoked for 1 year.  Vaping Use   Vaping status: Never Used  Substance and Sexual Activity   Alcohol use: No   Drug use: No   Sexual activity: Yes    Birth control/protection: None  Other Topics Concern   Not on file  Social History Narrative   Not on file   Social Drivers of Health   Financial Resource Strain: High Risk (09/15/2023)   Overall Financial Resource Strain (CARDIA)    Difficulty of Paying Living Expenses: Very hard  Food Insecurity: No Food Insecurity (09/15/2023)   Hunger Vital Sign    Worried About Running Out of Food in the Last Year: Never true    Ran Out of Food in the Last Year: Never true  Transportation Needs: Unmet Transportation Needs (09/15/2023)   PRAPARE - Transportation    Lack of Transportation (Medical): Yes    Lack of Transportation (Non-Medical): Yes  Physical Activity: Not on file  Stress: No Stress Concern Present (09/15/2023)   [employer] of Occupational Health - Occupational Stress Questionnaire    Feeling of Stress : Only a little  Social Connections: Moderately Integrated (07/01/2023)   Social Connection and Isolation Panel    Frequency of Communication with Friends and Family: Three times a week    Frequency of Social Gatherings with Friends and Family: Never    Attends Religious Services: More than 4 times per year    Active Member of [employer] or Organizations: Yes    Attends Engineer, structural: More than 4 times per year    Marital Status: Divorced    Additional Social History:   Allergies:   Allergies  Allergen Reactions   Penicillin G Nausea And Vomiting    Metabolic Disorder Labs: Lab Results  Component Value Date   HGBA1C 5.5 06/04/2021   MPG 111.15 06/04/2021   No results found for: PROLACTIN Lab Results  Component Value Date   CHOL 163 06/05/2021    TRIG 230 (H) 06/05/2021   HDL 37 (L) 06/05/2021   CHOLHDL 4.4 06/05/2021   VLDL 46 (H) 06/05/2021   LDLCALC 80 06/05/2021   LDLCALC  12/16/2008    86        Total Cholesterol/HDL:CHD Risk Coronary Heart Disease Risk Table                     Men   Women  1/2 Average Risk   3.4   3.3  Average Risk       5.0   4.4  2 X Average Risk   9.6   7.1  3 X Average Risk  23.4   11.0  Use the calculated Patient Ratio above and the CHD Risk Table to determine the patient's CHD Risk.        ATP III CLASSIFICATION (LDL):  <100     mg/dL   Optimal  899-870  mg/dL   Near or Above                    Optimal  130-159  mg/dL   Borderline  839-810  mg/dL   High  >809     mg/dL   Very High     Therapeutic Level Labs: No results found for: LITHIUM No results found for: CBMZ No results found for: VALPROATE  Current Medications: Current Outpatient Medications  Medication Sig Dispense Refill   carvedilol  (COREG ) 6.25 MG tablet Take 1 tablet (6.25 mg total) by mouth 2 (two) times daily with a meal. 60 tablet 0   rosuvastatin  (CRESTOR ) 5 MG tablet Take 1 tablet (5 mg total) by mouth daily. 90 tablet 3   ARIPiprazole  (ABILIFY ) 5 MG tablet Take 1 tablet (5 mg total) by mouth at bedtime. 30 tablet 3   isosorbide  mononitrate (IMDUR ) 30 MG 24 hr tablet Take 1 tablet (30 mg total) by mouth daily. (Patient not taking: Reported on 10/28/2023) 30 tablet 6   sucroferric oxyhydroxide (VELPHORO ) 500 MG chewable tablet Chew 3 tablets (1,500 mg total) by mouth 3 (three) times daily with meals. (Patient not taking: Reported on 10/28/2023) 90 tablet 0   No current facility-administered medications for this visit.    Musculoskeletal: Strength & Muscle Tone: abnormal Gait & Station: ataxic Patient leans: Right  Psychiatric Specialty Exam: Review of Systems  Blood pressure (!) 191/87, pulse 94, height 5' 3 (1.6 m), weight 180 lb (81.6 kg).Body mass index is 31.89 kg/m.  General Appearance: Casual   Eye Contact:  Fair  Speech:  Clear and Coherent  Volume:  Normal  Mood:  NA  Affect:  Appropriate  Thought Process:  Coherent  Orientation:  Full (Time, Place, and Person)  Thought Content:  Delusions  Suicidal Thoughts:  No  Homicidal Thoughts:  No  Memory:  Negative  Judgement:  Poor  Insight:  Lacking  Psychomotor Activity:  Normal  Concentration:    Recall:  Fair  Fund of Knowledge:Fair  Language: Good  Akathisia:  No  Handed:  Right  AIMS (if indicated  Assets:  Desire for Improvement  ADL's:  Intact  Cognition: WNL  Sleep:  Good   Screenings: CAGE-AID    Flowsheet Row ED to Hosp-Admission (Discharged) from 07/24/2023 in [facility]  CAGE-AID Score 0   GAD-7    Flowsheet Row Office Visit from 09/15/2023 in [facility] [place]. [facility]  Total GAD-7 Score 1   PHQ2-9    Flowsheet Row Office Visit from 09/15/2023 in [facility] Of [place]. [facility] Office Visit from 06/07/2021 in [facility] - [facility] Dept [facility] [place]. [facility] Office Visit from 11/03/2019 in [facility] Of [place]. [facility]  PHQ-2 Total Score 0 0 0  PHQ-9 Total Score -- -- 3   Flowsheet Row ED from 10/27/2023 in [facility] Emergency Department at [facility] ED from 10/24/2023 in [facility] Emergency Department at [facility] ED from 10/22/2023 in [facility] Emergency Department at [facility]  C-SSRS RISK  CATEGORY No Risk No Risk No Risk    Assessment and Plan:    At this time the patient seems to be stable.  He will continue taking 5 mg of Abilify .  He has a history of alcohol dependency.  Most likely I believe he has a delusional disorder.  He will return to see me in about 6 weeks we will continue establishing a relationship.  Collaboration of Care:   Patient/Guardian was advised Release of  Information must be obtained prior to any record release in order to collaborate their care with an outside provider. Patient/Guardian was advised if they have not already done so to contact the registration department to sign all necessary forms in order for us  to release information regarding their care.   Consent: Patient/Guardian gives verbal consent for treatment and assignment of benefits for services provided during this visit. Patient/Guardian expressed understanding and agreed to proceed.   Elna LILLETTE Lo, MD 6/24/20253:12 PM

## 2023-10-29 ENCOUNTER — Other Ambulatory Visit: Payer: Self-pay

## 2023-10-30 ENCOUNTER — Ambulatory Visit: Payer: Self-pay | Admitting: Internal Medicine

## 2023-10-31 ENCOUNTER — Emergency Department (HOSPITAL_COMMUNITY): Admission: EM | Admit: 2023-10-31 | Discharge: 2023-11-01 | Disposition: A | Discharge status: Home / Self Care

## 2023-10-31 ENCOUNTER — Encounter (HOSPITAL_COMMUNITY): Payer: Self-pay | Admitting: *Deleted

## 2023-10-31 ENCOUNTER — Other Ambulatory Visit: Payer: Self-pay

## 2023-10-31 ENCOUNTER — Emergency Department (HOSPITAL_COMMUNITY)

## 2023-10-31 DIAGNOSIS — N186 End stage renal disease: Secondary | ICD-10-CM | POA: Insufficient documentation

## 2023-10-31 DIAGNOSIS — I120 Hypertensive chronic kidney disease with stage 5 chronic kidney disease or end stage renal disease: Secondary | ICD-10-CM | POA: Diagnosis not present

## 2023-10-31 DIAGNOSIS — Z992 Dependence on renal dialysis: Secondary | ICD-10-CM | POA: Insufficient documentation

## 2023-10-31 DIAGNOSIS — I517 Cardiomegaly: Secondary | ICD-10-CM | POA: Diagnosis not present

## 2023-10-31 DIAGNOSIS — R0602 Shortness of breath: Secondary | ICD-10-CM | POA: Insufficient documentation

## 2023-10-31 DIAGNOSIS — R0989 Other specified symptoms and signs involving the circulatory and respiratory systems: Secondary | ICD-10-CM | POA: Diagnosis not present

## 2023-10-31 LAB — BASIC METABOLIC PANEL WITH GFR
Anion gap: 18 — ABNORMAL HIGH (ref 5–15)
BUN: 90 mg/dL — ABNORMAL HIGH (ref 6–20)
CO2: 16 mmol/L — ABNORMAL LOW (ref 22–32)
Calcium: 6.7 mg/dL — ABNORMAL LOW (ref 8.9–10.3)
Chloride: 103 mmol/L (ref 98–111)
Creatinine, Ser: 14.55 mg/dL — ABNORMAL HIGH (ref 0.61–1.24)
GFR, Estimated: 3 mL/min — ABNORMAL LOW (ref >60)
Glucose, Bld: 79 mg/dL (ref 70–99)
Potassium: 4.9 mmol/L (ref 3.5–5.1)
Sodium: 137 mmol/L (ref 135–145)

## 2023-10-31 LAB — CBC
HCT: 24 % — ABNORMAL LOW (ref 39.0–52.0)
Hemoglobin: 7.8 g/dL — ABNORMAL LOW (ref 13.0–17.0)
MCH: 31.2 pg (ref 26.0–34.0)
MCHC: 32.5 g/dL (ref 30.0–36.0)
MCV: 96 fL (ref 80.0–100.0)
Platelets: 258 10*3/uL (ref 150–400)
RBC: 2.5 MIL/uL — ABNORMAL LOW (ref 4.22–5.81)
RDW: 15.1 % (ref 11.5–15.5)
WBC: 8.8 10*3/uL (ref 4.0–10.5)
nRBC: 0 % (ref 0.0–0.2)

## 2023-10-31 MED ORDER — NEPRO/CARBSTEADY PO LIQD
237.0000 mL | ORAL | Status: DC | PRN
Start: 2023-10-31 — End: 2023-11-01

## 2023-10-31 MED ORDER — LIDOCAINE HCL (PF) 1 % IJ SOLN: 5.0000 mL | INTRAMUSCULAR | Status: DC | PRN

## 2023-10-31 MED ORDER — HEPARIN SODIUM (PORCINE) 1000 UNIT/ML IJ SOLN
INTRAMUSCULAR | Status: AC
  Filled 2023-10-31: qty 4

## 2023-10-31 MED ORDER — ANTICOAGULANT SODIUM CITRATE 4% (200MG/5ML) IV SOLN
5.0000 mL | Status: DC | PRN
Start: 2023-10-31 — End: 2023-11-01

## 2023-10-31 MED ORDER — PENTAFLUOROPROP-TETRAFLUOROETH EX AERO: 1.0000 | INHALATION_SPRAY | CUTANEOUS | Status: DC | PRN

## 2023-10-31 MED ORDER — LIDOCAINE-PRILOCAINE 2.5-2.5 % EX CREA: 1.0000 | TOPICAL_CREAM | CUTANEOUS | Status: DC | PRN

## 2023-10-31 MED ORDER — ALTEPLASE 2 MG IJ SOLR: 2.0000 mg | Freq: Once | INTRAMUSCULAR | Status: DC | PRN

## 2023-10-31 MED ORDER — HEPARIN SODIUM (PORCINE) 1000 UNIT/ML DIALYSIS
1000.0000 [IU] | INTRAMUSCULAR | Status: DC | PRN
Start: 2023-10-31 — End: 2023-11-01

## 2023-10-31 MED ORDER — CHLORHEXIDINE GLUCONATE CLOTH 2 % EX PADS: 6.0000 | MEDICATED_PAD | Freq: Every day | CUTANEOUS | Status: DC

## 2023-10-31 MED ORDER — HEPARIN SODIUM (PORCINE) 1000 UNIT/ML DIALYSIS
2400.0000 [IU] | Freq: Once | INTRAMUSCULAR | Status: DC
Start: 2023-10-31 — End: 2023-11-01
  Filled 2023-10-31: qty 3

## 2023-10-31 MED ORDER — HEPARIN SODIUM (PORCINE) 1000 UNIT/ML DIALYSIS
1000.0000 [IU] | INTRAMUSCULAR | Status: AC | PRN
Start: 2023-10-31 — End: 2023-10-31
  Administered 2023-10-31: 1000 [IU] via INTRAVENOUS_CENTRAL

## 2023-10-31 NOTE — Procedures (Signed)
 I was present at the procedure, reviewed the HD regimen and made appropriate changes.   Myer Fret MD  [facility] 10/31/2023, 5:08 PM

## 2023-10-31 NOTE — ED Provider Notes (Signed)
 [place] [facility]OSPITAL Provider Note   CSN: [id number] Arrival date & time: 10/31/23  9365     Patient presents with: Vascular Access Problem   Paul [name] is a 58 y.o. male.   58 year old male with past medical history of end-stage renal disease presenting to the emergency department today with dyspnea.  The patient states that he missed his last dialysis session and has had some shortness of breath since.  He normally has dialysis Mondays, Wednesdays, and Fridays.  Denies any associated chest pain or leg swelling.  He came to the ER today for further evaluation regarding this due to these ongoing symptoms.        Prior to Admission medications   Medication Sig Start Date End Date Taking? Authorizing Provider  ARIPiprazole  (ABILIFY ) 5 MG tablet Take 1 tablet (5 mg total) by mouth at bedtime. 10/28/23   Plovsky, Elna, MD  carvedilol  (COREG ) 6.25 MG tablet Take 1 tablet (6.25 mg total) by mouth 2 (two) times daily with a meal. 07/29/23 10/28/23  Tawkaliyar, Roya, DO  isosorbide  mononitrate (IMDUR ) 30 MG 24 hr tablet Take 1 tablet (30 mg total) by mouth daily. Patient not taking: Reported on 10/28/2023 10/25/23   [name], MD  rosuvastatin  (CRESTOR ) 5 MG tablet Take 1 tablet (5 mg total) by mouth daily. 03/24/23     sucroferric oxyhydroxide (VELPHORO ) 500 MG chewable tablet Chew 3 tablets (1,500 mg total) by mouth 3 (three) times daily with meals. Patient not taking: Reported on 10/28/2023 07/29/23   Tawkaliyar, Roya, DO    Allergies: Penicillin g    Review of Systems  Respiratory:  Positive for shortness of breath.   All other systems reviewed and are negative.   Updated Vital Signs BP (!) 171/109   Pulse 94   Temp 98.3 F (36.8 C)   Resp 20   Ht 5' 3 (1.6 m)   Wt 79.6 kg   SpO2 98%   BMI 31.09 kg/m   Physical Exam Vitals and nursing note reviewed.   Gen: NAD Eyes: PERRL, EOMI HEENT: no oropharyngeal swelling Neck:  trachea midline Resp: clear to auscultation bilaterally Card: RRR, no murmurs, rubs, or gallops Abd: nontender, nondistended Extremities: no calf tenderness, no edema Vascular: 2+ radial pulses bilaterally, 2+ DP pulses bilaterally Skin: no rashes Psyc: acting appropriately    (all labs ordered are listed, but only abnormal results are displayed) Labs Reviewed  CBC - Abnormal; Notable for the following components:      Result Value   RBC 2.50 (*)    Hemoglobin 7.8 (*)    HCT 24.0 (*)    All other components within normal limits  BASIC METABOLIC PANEL WITH GFR - Abnormal; Notable for the following components:   CO2 16 (*)    BUN 90 (*)    Creatinine, Ser 14.55 (*)    Calcium  6.7 (*)    GFR, Estimated 3 (*)    Anion gap 18 (*)    All other components within normal limits  HEPATITIS B SURFACE ANTIGEN    EKG: EKG Interpretation Date/Time:  Friday October 31 2023 06:46:43 EDT Ventricular Rate:  78 PR Interval:  194 QRS Duration:  94 QT Interval:  442 QTC Calculation: 503 R Axis:   -22  Text Interpretation: Normal sinus rhythm Septal infarct , age undetermined Prolonged QT Abnormal ECG When compared with ECG of 08-Oct-2023 17:20, PREVIOUS ECG IS PRESENT Confirmed by Ula Barter [phone]) on 10/31/2023 9:18:16 AM  Radiology: DG Chest 1 View Result Date: 10/31/2023 CLINICAL DATA:  Shortness of breath EXAM: CHEST  1 VIEW COMPARISON:  Chest x-ray performed October 08, 2023 FINDINGS: Heart is enlarged. Interstitial airspace opacities. Right axillary stent material. No pleural effusion or pneumothorax. IMPRESSION: 1. Enlarged heart with pulmonary vascular congestion, increased from prior. Electronically Signed   By: Maude Naegeli M.D.   On: 10/31/2023 09:38     Procedures   Medications Ordered in the ED  Chlorhexidine  Gluconate Cloth 2 % PADS 6 each (has no administration in time range)  pentafluoroprop-tetrafluoroeth (GEBAUERS) aerosol 1 Application (has no administration in time range)   lidocaine  (PF) (XYLOCAINE ) 1 % injection 5 mL (has no administration in time range)  lidocaine -prilocaine  (EMLA ) cream 1 Application (has no administration in time range)  feeding supplement (NEPRO CARB STEADY) liquid 237 mL (has no administration in time range)  heparin  injection 1,000 Units (has no administration in time range)  anticoagulant sodium citrate  solution 5 mL (has no administration in time range)  alteplase  (CATHFLO ACTIVASE ) injection 2 mg (has no administration in time range)  heparin  injection 2,400 Units (has no administration in time range)  heparin  injection 1,000 Units (1,000 Units Dialysis Given 10/31/23 1428)                                    Medical Decision Making 58 year old male with past medical history of end-stage renal disease presenting to the emergency department today with shortness of breath after missing dialysis on Wednesday.  Will further evaluate the patient here with basic labs well as a chest x-ray.  SPECT this is likely due to his missed dialysis.  He has not required any oxygen.  Is 93% on room air.  Will call nephrology to have the patient dialyzed here.  If symptoms improve after dialysis he will likely be discharged.  Plan is for discharge after dialysis.  Amount and/or Complexity of Data Reviewed Labs: ordered. Radiology: ordered.        Final diagnoses:  ESRD (end stage renal disease) [facility])    ED Discharge Orders     None          Ula Prentice SAUNDERS, MD 10/31/23 1702

## 2023-10-31 NOTE — ED Notes (Signed)
Transported to hemodialysis.  

## 2023-10-31 NOTE — ED Triage Notes (Signed)
 Patient is here for his regular dialysis treatment , states he feels more sob today however didn't go to dialysis on Wed. So he last had dialysis on Monday.

## 2023-10-31 NOTE — Progress Notes (Signed)
 Spoke to [employer] with [facility] out-pt HD. Paul [name] is awaiting clinicals from [facility] in order to send referral to [facility] for review. Will await determination.   Randine Mungo Renal Navigator [phone]

## 2023-10-31 NOTE — Progress Notes (Signed)
   10/31/23 1619  Vitals  Pulse Rate 94  Resp 20  BP (!) 171/109  SpO2 98 %  O2 Device Nasal Cannula  Weight 79.6 kg  Type of Weight Post-Dialysis  Oxygen Therapy  O2 Flow Rate (L/min) 2 L/min  Patient Activity (if Appropriate) In bed  Pulse Oximetry Type Continuous  Post Treatment  Dialyzer Clearance Clear  Liters Processed 84  Fluid Removed (mL) 2000 mL  Tolerated HD Treatment Yes  AVG/AVF Arterial Site Held (minutes) 7 minutes  AVG/AVF Venous Site Held (minutes) 7 minutes   Received patient in bed to unit.  Alert and oriented.  Informed consent signed and in chart.    TX duration: 3.5hrs   Patient tolerated well.  Transported back to the room  Alert, without acute distress.  Hand-off given to patient's nurse.    Access used: R AVF Access issues: none   Total UF removed: 2L Medication(s) given: see eMAR   Woodfin LITTIE Dolores, RN Dialysis Kidney Unit

## 2023-10-31 NOTE — ED Notes (Signed)
 Here for routine dialysis. No distress noted lying on the stretcher.

## 2023-10-31 NOTE — ED Notes (Signed)
 Dialysis unit called and aware patient is here.

## 2023-11-03 ENCOUNTER — Emergency Department (HOSPITAL_COMMUNITY)
Admission: EM | Admit: 2023-11-03 | Discharge: 2023-11-03 | Discharge status: Against Medical Advice | Attending: Emergency Medicine | Admitting: Emergency Medicine

## 2023-11-03 ENCOUNTER — Other Ambulatory Visit: Payer: Self-pay

## 2023-11-03 ENCOUNTER — Encounter (HOSPITAL_COMMUNITY): Payer: Self-pay

## 2023-11-03 DIAGNOSIS — R0609 Other forms of dyspnea: Secondary | ICD-10-CM | POA: Insufficient documentation

## 2023-11-03 DIAGNOSIS — Z5329 Procedure and treatment not carried out because of patient's decision for other reasons: Secondary | ICD-10-CM | POA: Insufficient documentation

## 2023-11-03 DIAGNOSIS — I120 Hypertensive chronic kidney disease with stage 5 chronic kidney disease or end stage renal disease: Secondary | ICD-10-CM | POA: Diagnosis not present

## 2023-11-03 DIAGNOSIS — Z992 Dependence on renal dialysis: Secondary | ICD-10-CM | POA: Diagnosis not present

## 2023-11-03 DIAGNOSIS — N186 End stage renal disease: Secondary | ICD-10-CM | POA: Diagnosis not present

## 2023-11-03 MED ORDER — CHLORHEXIDINE GLUCONATE CLOTH 2 % EX PADS: 6.0000 | MEDICATED_PAD | Freq: Every day | CUTANEOUS | Status: DC

## 2023-11-03 NOTE — ED Triage Notes (Signed)
 Pt coming in because he needs dialysis.

## 2023-11-03 NOTE — Progress Notes (Signed)
 Late Note Entry- November 03, 2023  Spoke to pt via phone. Pt advised that referral to [facility] in [facility] is still pending. Spoke to [employer] with [facility] out-pt HD who confirms that there is not an accepting MD as of yet. Kim awaiting final determination from clinic.   Randine Mungo Renal Navigator [phone]

## 2023-11-03 NOTE — ED Provider Notes (Addendum)
  [place] EMERGENCY DEPARTMENT AT [facility] Provider Note   CSN: [id number] Arrival date & time: 11/03/23  9374     Patient presents with: Vascular Access Problem   RYLEY BACHTEL is a 58 y.o. male.   58 year old male here today because he needs dialysis.  Endorses ongoing dyspnea with exertion.        Prior to Admission medications   Medication Sig Start Date End Date Taking? Authorizing Provider  ARIPiprazole  (ABILIFY ) 5 MG tablet Take 1 tablet (5 mg total) by mouth at bedtime. 10/28/23   Plovsky, Elna, MD  carvedilol  (COREG ) 6.25 MG tablet Take 1 tablet (6.25 mg total) by mouth 2 (two) times daily with a meal. 07/29/23 10/28/23  Tawkaliyar, Roya, DO  isosorbide  mononitrate (IMDUR ) 30 MG 24 hr tablet Take 1 tablet (30 mg total) by mouth daily. Patient not taking: Reported on 10/28/2023 10/25/23   [name], MD  rosuvastatin  (CRESTOR ) 5 MG tablet Take 1 tablet (5 mg total) by mouth daily. 03/24/23     sucroferric oxyhydroxide (VELPHORO ) 500 MG chewable tablet Chew 3 tablets (1,500 mg total) by mouth 3 (three) times daily with meals. Patient not taking: Reported on 10/28/2023 07/29/23   Tawkaliyar, Roya, DO    Allergies: Penicillin g    Review of Systems  Updated Vital Signs BP (!) 174/111   Pulse 78   Temp (!) 97.5 F (36.4 C)   Resp 16   Ht 5' 3 (1.6 m)   Wt 79.6 kg   SpO2 100%   BMI 31.09 kg/m   Physical Exam Vitals and nursing note reviewed.  HENT:     Head: Normocephalic.   Cardiovascular:     Rate and Rhythm: Normal rate.  Pulmonary:     Effort: Pulmonary effort is normal.     Breath sounds: Normal breath sounds.     (all labs ordered are listed, but only abnormal results are displayed) Labs Reviewed - No data to display  EKG: None  Radiology: No results found.   Procedures   Medications Ordered in the ED  Chlorhexidine  Gluconate Cloth 2 % PADS 6 each (0 each Topical Hold 11/03/23 1221)                                     Medical Decision Making 58 year old male here today for dialysis, endorses ongoing dyspnea on exertion.  Plan-well-appearing on exam.  Normal vital signs.  Respirations are 16, 98% on room air.  I have reached out to dialysis team, they have placed him on their list.  Patient had chest x-ray done couple of days ago that was normal.  11 follow-up with his PCP for his ongoing dyspnea on exertion which has been ongoing for several months.  Reassessment 2:30 PM-patient eloped from the emergency department because he had not yet received dialysis.        Final diagnoses:  ESRD (end stage renal disease) [facility])    ED Discharge Orders     None          Mannie Pac T, DO 11/03/23 1417    Mannie Pac T, DO 11/03/23 1429

## 2023-11-04 ENCOUNTER — Encounter (HOSPITAL_COMMUNITY): Payer: Self-pay

## 2023-11-04 ENCOUNTER — Other Ambulatory Visit: Payer: Self-pay

## 2023-11-04 ENCOUNTER — Emergency Department (HOSPITAL_COMMUNITY)

## 2023-11-04 ENCOUNTER — Emergency Department (HOSPITAL_COMMUNITY)
Admission: EM | Admit: 2023-11-04 | Discharge: 2023-11-04 | Disposition: A | Discharge status: Home / Self Care | Attending: Emergency Medicine | Admitting: Emergency Medicine

## 2023-11-04 DIAGNOSIS — I132 Hypertensive heart and chronic kidney disease with heart failure and with stage 5 chronic kidney disease, or end stage renal disease: Secondary | ICD-10-CM | POA: Diagnosis not present

## 2023-11-04 DIAGNOSIS — I509 Heart failure, unspecified: Secondary | ICD-10-CM | POA: Diagnosis not present

## 2023-11-04 DIAGNOSIS — I120 Hypertensive chronic kidney disease with stage 5 chronic kidney disease or end stage renal disease: Secondary | ICD-10-CM | POA: Diagnosis not present

## 2023-11-04 DIAGNOSIS — N186 End stage renal disease: Secondary | ICD-10-CM | POA: Insufficient documentation

## 2023-11-04 DIAGNOSIS — I10 Essential (primary) hypertension: Secondary | ICD-10-CM | POA: Diagnosis not present

## 2023-11-04 DIAGNOSIS — R0689 Other abnormalities of breathing: Secondary | ICD-10-CM | POA: Diagnosis not present

## 2023-11-04 DIAGNOSIS — Z87891 Personal history of nicotine dependence: Secondary | ICD-10-CM | POA: Diagnosis not present

## 2023-11-04 DIAGNOSIS — Z992 Dependence on renal dialysis: Secondary | ICD-10-CM | POA: Diagnosis not present

## 2023-11-04 DIAGNOSIS — R0902 Hypoxemia: Secondary | ICD-10-CM | POA: Diagnosis not present

## 2023-11-04 DIAGNOSIS — R0989 Other specified symptoms and signs involving the circulatory and respiratory systems: Secondary | ICD-10-CM | POA: Diagnosis not present

## 2023-11-04 DIAGNOSIS — R0602 Shortness of breath: Secondary | ICD-10-CM | POA: Insufficient documentation

## 2023-11-04 DIAGNOSIS — R079 Chest pain, unspecified: Secondary | ICD-10-CM | POA: Diagnosis not present

## 2023-11-04 DIAGNOSIS — I517 Cardiomegaly: Secondary | ICD-10-CM | POA: Diagnosis not present

## 2023-11-04 LAB — CBC WITH DIFFERENTIAL/PLATELET
Abs Immature Granulocytes: 0.02 10*3/uL (ref 0.00–0.07)
Basophils Absolute: 0.1 10*3/uL (ref 0.0–0.1)
Basophils Relative: 1 %
Eosinophils Absolute: 0.2 10*3/uL (ref 0.0–0.5)
Eosinophils Relative: 2 %
HCT: 23.1 % — ABNORMAL LOW (ref 39.0–52.0)
Hemoglobin: 7.6 g/dL — ABNORMAL LOW (ref 13.0–17.0)
Immature Granulocytes: 0 %
Lymphocytes Relative: 9 %
Lymphs Abs: 0.8 10*3/uL (ref 0.7–4.0)
MCH: 32.2 pg (ref 26.0–34.0)
MCHC: 32.9 g/dL (ref 30.0–36.0)
MCV: 97.9 fL (ref 80.0–100.0)
Monocytes Absolute: 0.7 10*3/uL (ref 0.1–1.0)
Monocytes Relative: 8 %
Neutro Abs: 7.5 10*3/uL (ref 1.7–7.7)
Neutrophils Relative %: 80 %
Platelets: 269 10*3/uL (ref 150–400)
RBC: 2.36 MIL/uL — ABNORMAL LOW (ref 4.22–5.81)
RDW: 15.2 % (ref 11.5–15.5)
WBC: 9.3 10*3/uL (ref 4.0–10.5)
nRBC: 0 % (ref 0.0–0.2)

## 2023-11-04 LAB — I-STAT CHEM 8, ED
BUN: 112 mg/dL — ABNORMAL HIGH (ref 6–20)
Calcium, Ion: 0.79 mmol/L — CL (ref 1.15–1.40)
Chloride: 102 mmol/L (ref 98–111)
Creatinine, Ser: 15.9 mg/dL — ABNORMAL HIGH (ref 0.61–1.24)
Glucose, Bld: 88 mg/dL (ref 70–99)
HCT: 23 % — ABNORMAL LOW (ref 39.0–52.0)
Hemoglobin: 7.8 g/dL — ABNORMAL LOW (ref 13.0–17.0)
Potassium: 6.5 mmol/L (ref 3.5–5.1)
Sodium: 137 mmol/L (ref 135–145)
TCO2: 20 mmol/L — ABNORMAL LOW (ref 22–32)

## 2023-11-04 LAB — BASIC METABOLIC PANEL WITH GFR
Anion gap: 15 (ref 5–15)
BUN: 103 mg/dL — ABNORMAL HIGH (ref 6–20)
CO2: 20 mmol/L — ABNORMAL LOW (ref 22–32)
Calcium: 6.5 mg/dL — ABNORMAL LOW (ref 8.9–10.3)
Chloride: 102 mmol/L (ref 98–111)
Creatinine, Ser: 14.62 mg/dL — ABNORMAL HIGH (ref 0.61–1.24)
GFR, Estimated: 3 mL/min — ABNORMAL LOW (ref >60)
Glucose, Bld: 91 mg/dL (ref 70–99)
Potassium: 6.5 mmol/L (ref 3.5–5.1)
Sodium: 137 mmol/L (ref 135–145)

## 2023-11-04 MED ORDER — CALCIUM GLUCONATE-NACL 1-0.675 GM/50ML-% IV SOLN
1.0000 g | Freq: Once | INTRAVENOUS | Status: AC
  Administered 2023-11-04: 1000 mg via INTRAVENOUS
  Filled 2023-11-04: qty 50

## 2023-11-04 MED ORDER — HEPARIN SODIUM (PORCINE) 1000 UNIT/ML DIALYSIS
40.0000 [IU]/kg | INTRAMUSCULAR | Status: DC | PRN
  Administered 2023-11-04: 3200 [IU] via INTRAVENOUS_CENTRAL
  Filled 2023-11-04: qty 4

## 2023-11-04 MED ORDER — CHLORHEXIDINE GLUCONATE CLOTH 2 % EX PADS: 6.0000 | MEDICATED_PAD | Freq: Every day | CUTANEOUS | Status: DC

## 2023-11-04 MED ORDER — HEPARIN SODIUM (PORCINE) 1000 UNIT/ML IJ SOLN
INTRAMUSCULAR | Status: AC
  Filled 2023-11-04: qty 3

## 2023-11-04 MED ORDER — PENTAFLUOROPROP-TETRAFLUOROETH EX AERO: 1.0000 | INHALATION_SPRAY | CUTANEOUS | Status: DC | PRN

## 2023-11-04 MED ORDER — SODIUM ZIRCONIUM CYCLOSILICATE 10 G PO PACK
10.0000 g | PACK | Freq: Once | ORAL | Status: AC
Start: 2023-11-04 — End: 2023-11-04
  Administered 2023-11-04: 10 g via ORAL
  Filled 2023-11-04: qty 1

## 2023-11-04 NOTE — ED Provider Notes (Cosign Needed Addendum)
 Accepted handoff at shift change from LM PA-C. Please see prior provider note for more detail.   Briefly: Patient is 58 y.o.   Patient is a 58 year old male CKD on dialysis missed last dialysis which was planned for yesterday.  Presents emergency department with potassium of 6.5 and evidence of volume overload with hypoxia stable on room air after nitroglycerin initial nasal cannula oxygen which was titrated to 0 after arrival.    Plan: Dialysis   @7 :35 am discussed with nephrology who will plan to dialyze patient     Physical Exam  BP (!) 165/98 (BP Location: Left Arm)   Pulse 84   Temp 98.2 F (36.8 C) (Oral)   Resp 20   Ht 5' 3 (1.6 m)   Wt 79.6 kg   SpO2 100%   BMI 31.09 kg/m   Physical Exam  Procedures  Procedures  ED Course / MDM    Medical Decision Making Amount and/or Complexity of Data Reviewed Labs: ordered. Radiology: ordered.  Risk Prescription drug management.   Patient awaiting dialysis.  Anticipate patient will be reasonable to discharge after completion of dialysis.     Neldon Hamp RAMAN, [place] 11/04/23 0812    Neldon Hamp RAMAN, PA 11/04/23 9186    Freddi Hamilton, MD 11/05/23 [phone]

## 2023-11-04 NOTE — Procedures (Signed)
 HD Note:  Some information was entered later than the data was gathered due to patient care needs. The stated time with the data is accurate.  Received patient from ED on stretcher for dialysis treatment and anticipated discharge to home.  Alert and oriented.   Informed consent signed and in chart.   Access used: upper right arm fistula Access issues: No issues  Patient experienced leg cramping and UF was paused.  See flowsheet for details   TX duration: 4 hours  Alert, without acute distress.  Total UF removed: 4500 ml  Hand-off given to patient's nurse.   Transported back to the room   Shuntay Everetts L. Lenon, RN Kidney Dialysis Unit.

## 2023-11-04 NOTE — ED Provider Notes (Signed)
 Reassessed patient after dialysis who is feeling well with no shortness of breath.  He is discharged with return precautions.   Ula Prentice SAUNDERS, MD 11/04/23 [phone]

## 2023-11-04 NOTE — Progress Notes (Signed)
 Spoke to [employer] with [facility] out-pt HD. Pt was denied by providers at [facility]. Met with pt at bedside while receiving HD. Pt made aware of this information. Pt discouraged by this news. Navigator advised pt that he will need to continue to come to ED to be assessed for HD needs and that navigator is planning on submitting a referral to [facility] admissions once new [facility] clinic opens in [place] (hopefully this month). Encouraged pt to continue going to mental health appts and to continue taking medications as prescribed by all of his providers. Renal PA made aware of this info and will update nephrologist as well. At this time, pt does not have an accepting nephrologist that will accept pt at a local HD clinic.   Randine Mungo Renal Navigator [phone]

## 2023-11-04 NOTE — ED Notes (Signed)
 Called [facility].

## 2023-11-04 NOTE — ED Provider Notes (Signed)
 [place] EMERGENCY DEPARTMENT AT [facility] Provider Note   CSN: [id number] Arrival date & time: 11/04/23  9449     Patient presents with: Shortness of Breath   Paul [name] is a 58 y.o. male.  Patient with past medical history significant CHF, ESRD on Monday Wednesday Friday dialysis presents the emergency department via EMS complaining of shortness of breath.  Patient came to the emergency department for dialysis yesterday but left prior to being seen due to wait.  This morning his shortness of breath worsened significantly.  EMS reports bilateral rails and initial oxygen saturation of 77%.  Patient was hypertensive with an initial blood pressure of 220/120.  EMS administered 2 doses of nitroglycerin sublingually.  He was placed on 4 L/min nasal cannula and oxygen saturations improved to 94% and blood pressure improved to 190/120.  Patient currently continues to complain of shortness of breath and is tachypneic upon arrival.  He denies chest pain, abdominal pain, nausea, vomiting.    Shortness of Breath      Prior to Admission medications   Medication Sig Start Date End Date Taking? Authorizing Provider  ARIPiprazole  (ABILIFY ) 5 MG tablet Take 1 tablet (5 mg total) by mouth at bedtime. 10/28/23   Plovsky, Elna, MD  carvedilol  (COREG ) 6.25 MG tablet Take 1 tablet (6.25 mg total) by mouth 2 (two) times daily with a meal. 07/29/23 10/28/23  Tawkaliyar, Roya, DO  isosorbide  mononitrate (IMDUR ) 30 MG 24 hr tablet Take 1 tablet (30 mg total) by mouth daily. Patient not taking: Reported on 10/28/2023 10/25/23   [name], MD  rosuvastatin  (CRESTOR ) 5 MG tablet Take 1 tablet (5 mg total) by mouth daily. 03/24/23     sucroferric oxyhydroxide (VELPHORO ) 500 MG chewable tablet Chew 3 tablets (1,500 mg total) by mouth 3 (three) times daily with meals. Patient not taking: Reported on 10/28/2023 07/29/23   Tawkaliyar, Roya, DO    Allergies: Penicillin g    Review of Systems   Respiratory:  Positive for shortness of breath.     Updated Vital Signs BP (!) 165/98 (BP Location: Left Arm)   Pulse 84   Temp 98.2 F (36.8 C) (Oral)   Resp 20   Ht 5' 3 (1.6 m)   Wt 79.6 kg   SpO2 100%   BMI 31.09 kg/m   Physical Exam Vitals and nursing note reviewed.  Constitutional:      General: He is not in acute distress.    Appearance: He is well-developed.  HENT:     Head: Normocephalic and atraumatic.   Eyes:     Conjunctiva/sclera: Conjunctivae normal.    Cardiovascular:     Rate and Rhythm: Normal rate and regular rhythm.     Heart sounds: No murmur heard. Pulmonary:     Effort: Pulmonary effort is normal. Tachypnea present. No respiratory distress.     Breath sounds: Rales present.  Chest:     Chest wall: No tenderness.  Abdominal:     Palpations: Abdomen is soft.     Tenderness: There is no abdominal tenderness.   Musculoskeletal:        General: No swelling.     Cervical back: Neck supple.   Skin:    General: Skin is warm and dry.     Capillary Refill: Capillary refill takes less than 2 seconds.   Neurological:     Mental Status: He is alert.   Psychiatric:        Mood and Affect: Mood  normal.     (all labs ordered are listed, but only abnormal results are displayed) Labs Reviewed  I-STAT CHEM 8, ED - Abnormal; Notable for the following components:      Result Value   Potassium 6.5 (*)    BUN 112 (*)    Creatinine, Ser 15.90 (*)    Calcium , Ion 0.79 (*)    TCO2 20 (*)    Hemoglobin 7.8 (*)    HCT 23.0 (*)    All other components within normal limits  CBC WITH DIFFERENTIAL/PLATELET  BASIC METABOLIC PANEL WITH GFR    EKG: EKG Interpretation Date/Time:  Tuesday November 04 2023 06:00:29 EDT Ventricular Rate:  83 PR Interval:  196 QRS Duration:  116 QT Interval:  441 QTC Calculation: 519 R Axis:   -60  Text Interpretation: Sinus rhythm Left anterior fascicular block Anterior infarct, old Confirmed by Midge Golas ([id number])  on 11/04/2023 6:04:02 AM  Radiology: [facility] Chest Portable 1 View Result Date: 11/04/2023 CLINICAL DATA:  58 year old male with shortness of breath, progressive for several days. EXAM: PORTABLE CHEST 1 VIEW COMPARISON:  Portable chest 10/31/2023 and earlier. FINDINGS: Portable AP semi upright view at 0613 hours. Stable lung volumes and mediastinal contours, borderline to mild cardiomegaly. Stable mild pulmonary vascular congestion, no overt edema. No pneumothorax, pleural effusion or confluent lung opacity. Right subclavian vascular stent redemonstrated. No acute osseous abnormality identified. Negative visible bowel gas. IMPRESSION: Stable mild cardiomegaly and vascular congestion without overt edema. Electronically Signed   By: VEAR Hurst M.D.   On: 11/04/2023 06:26     .Critical Care  Performed by: Logan Ubaldo NOVAK, PA-C Authorized by: Logan Ubaldo NOVAK, PA-C   Critical care provider statement:    Critical care time (minutes):  30   Critical care was necessary to treat or prevent imminent or life-threatening deterioration of the following conditions:  Renal failure   Critical care was time spent personally by me on the following activities:  Development of treatment plan with patient or surrogate, discussions with consultants, evaluation of patient's response to treatment, examination of patient, ordering and review of laboratory studies, ordering and review of radiographic studies, ordering and performing treatments and interventions, pulse oximetry, re-evaluation of patient's condition and review of old charts    Medications Ordered in the ED  calcium  gluconate 1 g/ 50 mL sodium chloride  IVPB (has no administration in time range)  sodium zirconium cyclosilicate  (LOKELMA ) packet 10 g (has no administration in time range)                                    Medical Decision Making Amount and/or Complexity of Data Reviewed Labs: ordered. Radiology: ordered.   This patient presents to the ED  for concern of shortness of breath, this involves an extensive number of treatment options, and is a complaint that carries with it a high risk of complications and morbidity.  The differential diagnosis includes fluid overload, pneumonia, ACS, others   Co morbidities / Chronic conditions that complicate the patient evaluation  ESRD on hemodialysis, missed session yesterday   Additional history obtained:  Additional history obtained from EMR   Lab Tests:  I Ordered, and personally interpreted labs.  The pertinent results include: Initial potassium 6.5   Imaging Studies ordered:  I ordered imaging studies including chest x-ray I independently visualized and interpreted imaging which showed  Stable mild cardiomegaly and vascular congestion without overt  edema.   I agree with the radiologist interpretation   Cardiac Monitoring: / EKG:  The patient was maintained on a cardiac monitor.  I personally viewed and interpreted the cardiac monitored which showed an underlying rhythm of: Sinus rhythm   Problem List / ED Course / Critical interventions / Medication management   I ordered medication including calcium  gluconate, Lokelma   Consultations Obtained:  I requested consultation with the nephrologist   Social Determinants of Health:  Patient is a former smoker   Test / Admission - Considered:  Patient care being transferred to [facility], PA-C at shift handoff.  Patient will need hemodialysis for signs of fluid overload and hyperkalemia.      Final diagnoses:  Shortness of breath  Acute hemodialysis encounter [facility])    ED Discharge Orders     None          Logan Ubaldo KATHEE DEVONNA 11/04/23 9366    Midge Golas, MD 11/04/23 [phone]

## 2023-11-04 NOTE — ED Notes (Signed)
 Pt walked out without being discharged. IV remaining in his arm. Attempted to call pt 2x, no answer.

## 2023-11-04 NOTE — ED Triage Notes (Signed)
 Pt BIB [facility] from home. Pt has been having SHOB for the past couple of days but reports it has gotten worse this morning. EMS reports bilateral rales. EMS gave 2 nitroglycerins en route.   EMS 220/120, 190/120  77% RA, 94% 4LNC 18 LAC

## 2023-11-05 ENCOUNTER — Encounter (HOSPITAL_COMMUNITY): Payer: Self-pay

## 2023-11-05 ENCOUNTER — Other Ambulatory Visit: Payer: Self-pay

## 2023-11-05 ENCOUNTER — Emergency Department (HOSPITAL_COMMUNITY)
Admission: EM | Admit: 2023-11-05 | Discharge: 2023-11-05 | Disposition: A | Discharge status: Home / Self Care | Attending: Emergency Medicine | Admitting: Emergency Medicine

## 2023-11-05 DIAGNOSIS — I5042 Chronic combined systolic (congestive) and diastolic (congestive) heart failure: Secondary | ICD-10-CM | POA: Diagnosis not present

## 2023-11-05 DIAGNOSIS — Z79899 Other long term (current) drug therapy: Secondary | ICD-10-CM | POA: Insufficient documentation

## 2023-11-05 DIAGNOSIS — I132 Hypertensive heart and chronic kidney disease with heart failure and with stage 5 chronic kidney disease, or end stage renal disease: Secondary | ICD-10-CM | POA: Diagnosis not present

## 2023-11-05 DIAGNOSIS — N186 End stage renal disease: Secondary | ICD-10-CM | POA: Diagnosis not present

## 2023-11-05 DIAGNOSIS — Z87891 Personal history of nicotine dependence: Secondary | ICD-10-CM | POA: Insufficient documentation

## 2023-11-05 DIAGNOSIS — Z992 Dependence on renal dialysis: Secondary | ICD-10-CM | POA: Diagnosis not present

## 2023-11-05 DIAGNOSIS — I120 Hypertensive chronic kidney disease with stage 5 chronic kidney disease or end stage renal disease: Secondary | ICD-10-CM | POA: Diagnosis not present

## 2023-11-05 LAB — CBC
HCT: 30.2 % — ABNORMAL LOW (ref 39.0–52.0)
Hemoglobin: 10.1 g/dL — ABNORMAL LOW (ref 13.0–17.0)
MCH: 31.8 pg (ref 26.0–34.0)
MCHC: 33.4 g/dL (ref 30.0–36.0)
MCV: 95 fL (ref 80.0–100.0)
Platelets: 293 10*3/uL (ref 150–400)
RBC: 3.18 MIL/uL — ABNORMAL LOW (ref 4.22–5.81)
RDW: 14.9 % (ref 11.5–15.5)
WBC: 7.1 10*3/uL (ref 4.0–10.5)
nRBC: 0 % (ref 0.0–0.2)

## 2023-11-05 LAB — BASIC METABOLIC PANEL WITH GFR
Anion gap: 19 — ABNORMAL HIGH (ref 5–15)
BUN: 54 mg/dL — ABNORMAL HIGH (ref 6–20)
CO2: 25 mmol/L (ref 22–32)
Calcium: 8.4 mg/dL — ABNORMAL LOW (ref 8.9–10.3)
Chloride: 96 mmol/L — ABNORMAL LOW (ref 98–111)
Creatinine, Ser: 9.7 mg/dL — ABNORMAL HIGH (ref 0.61–1.24)
GFR, Estimated: 6 mL/min — ABNORMAL LOW (ref >60)
Glucose, Bld: 128 mg/dL — ABNORMAL HIGH (ref 70–99)
Potassium: 3.6 mmol/L (ref 3.5–5.1)
Sodium: 140 mmol/L (ref 135–145)

## 2023-11-05 NOTE — Discharge Instructions (Signed)
 It was a pleasure caring for you today in the emergency department.  Please establish care with outpatient dialysis clinic  Please return to the emergency department for any worsening or worrisome symptoms.

## 2023-11-05 NOTE — ED Notes (Signed)
 Pt in bed, pt states that he is here for dialysis, states that he hasn't been able to find a place to get his dialysis, pt has no other complaints, pt denies shortness of breath, resps even and unlabored, pt awake and oriented.

## 2023-11-05 NOTE — ED Notes (Signed)
 Pt standing by nurses station, states that he is ready to go home, pt denies pain, pt states that he doesn't want to wait for discharge vitals, states that he is ready to leave. Pt verbalized understanding d/c and follow up. Pt ambulatory from department with steady gait.
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
## 2023-11-05 NOTE — ED Triage Notes (Signed)
 Pt here for routine dialysis, last dialysis was yesterday due to missing Monday and feeling sob. Pt states his breathing has improved today.

## 2023-11-06 ENCOUNTER — Emergency Department (HOSPITAL_COMMUNITY)
Admission: EM | Admit: 2023-11-06 | Discharge: 2023-11-06 | Disposition: A | Discharge status: Home / Self Care | Attending: Emergency Medicine | Admitting: Emergency Medicine

## 2023-11-06 ENCOUNTER — Encounter (HOSPITAL_COMMUNITY): Payer: Self-pay | Admitting: *Deleted

## 2023-11-06 ENCOUNTER — Other Ambulatory Visit: Payer: Self-pay

## 2023-11-06 DIAGNOSIS — N186 End stage renal disease: Secondary | ICD-10-CM | POA: Insufficient documentation

## 2023-11-06 DIAGNOSIS — Z992 Dependence on renal dialysis: Secondary | ICD-10-CM | POA: Insufficient documentation

## 2023-11-06 DIAGNOSIS — I120 Hypertensive chronic kidney disease with stage 5 chronic kidney disease or end stage renal disease: Secondary | ICD-10-CM | POA: Diagnosis not present

## 2023-11-06 LAB — BASIC METABOLIC PANEL WITH GFR
Anion gap: 19 — ABNORMAL HIGH (ref 5–15)
BUN: 75 mg/dL — ABNORMAL HIGH (ref 6–20)
CO2: 20 mmol/L — ABNORMAL LOW (ref 22–32)
Calcium: 7.2 mg/dL — ABNORMAL LOW (ref 8.9–10.3)
Chloride: 98 mmol/L (ref 98–111)
Creatinine, Ser: 11.66 mg/dL — ABNORMAL HIGH (ref 0.61–1.24)
GFR, Estimated: 5 mL/min — ABNORMAL LOW (ref >60)
Glucose, Bld: 90 mg/dL (ref 70–99)
Potassium: 3.8 mmol/L (ref 3.5–5.1)
Sodium: 137 mmol/L (ref 135–145)

## 2023-11-06 LAB — CBC
HCT: 28.2 % — ABNORMAL LOW (ref 39.0–52.0)
Hemoglobin: 9.1 g/dL — ABNORMAL LOW (ref 13.0–17.0)
MCH: 31.6 pg (ref 26.0–34.0)
MCHC: 32.3 g/dL (ref 30.0–36.0)
MCV: 97.9 fL (ref 80.0–100.0)
Platelets: 269 10*3/uL (ref 150–400)
RBC: 2.88 MIL/uL — ABNORMAL LOW (ref 4.22–5.81)
RDW: 15 % (ref 11.5–15.5)
WBC: 6 10*3/uL (ref 4.0–10.5)
nRBC: 0 % (ref 0.0–0.2)

## 2023-11-06 MED ORDER — CHLORHEXIDINE GLUCONATE CLOTH 2 % EX PADS: 6.0000 | MEDICATED_PAD | Freq: Every day | CUTANEOUS | Status: DC

## 2023-11-06 NOTE — ED Triage Notes (Signed)
 Pt is here for routine HD.  He states that he is feeling pretty good right now.  Last treatment was Tuesday 7/1.  No CP and no SOB.

## 2023-11-06 NOTE — ED Notes (Signed)
 No complaints reported.  No acute distress at this time.

## 2023-11-06 NOTE — ED Notes (Signed)
 Pt has fistula on right upper arm.  Pt reports he normally gets hemodialysis [facility], [facility], [facility] but decided not to stay yesterday as they were taking too long.

## 2023-11-06 NOTE — ED Provider Notes (Signed)
 [place] EMERGENCY DEPARTMENT AT [facility] Provider Note   CSN: [id number] Arrival date & time: 11/06/23  9256     Patient presents with: Vascular Access Problem (Needs Dialysis)   Paul [name] is a 58 y.o. male.   Patient reports he is here for dialysis.  He denies any complaints.  Patient reports he feels like he is a little more volume overloaded than usual because he drank more fluid than he should.  Patient denies any other complaints his last dialysis was 2 days ago.  Patient denies any current shortness of breath he is not having any swelling.  The history is provided by the patient. No language interpreter was used.       Prior to Admission medications   Medication Sig Start Date End Date Taking? Authorizing Provider  ARIPiprazole  (ABILIFY ) 5 MG tablet Take 1 tablet (5 mg total) by mouth at bedtime. 10/28/23   Plovsky, Elna, MD  carvedilol  (COREG ) 6.25 MG tablet Take 1 tablet (6.25 mg total) by mouth 2 (two) times daily with a meal. 07/29/23 10/28/23  Tawkaliyar, Roya, DO  isosorbide  mononitrate (IMDUR ) 30 MG 24 hr tablet Take 1 tablet (30 mg total) by mouth daily. Patient not taking: Reported on 10/28/2023 10/25/23   [name], MD  rosuvastatin  (CRESTOR ) 5 MG tablet Take 1 tablet (5 mg total) by mouth daily. 03/24/23     sucroferric oxyhydroxide (VELPHORO ) 500 MG chewable tablet Chew 3 tablets (1,500 mg total) by mouth 3 (three) times daily with meals. Patient not taking: Reported on 10/28/2023 07/29/23   Tawkaliyar, Roya, DO    Allergies: Penicillin g    Review of Systems  All other systems reviewed and are negative.   Updated Vital Signs BP (!) 160/109   Pulse 86   Temp 97.9 F (36.6 C) (Oral)   Resp 16   SpO2 100%   Physical Exam Vitals and nursing note reviewed.  Constitutional:      Appearance: He is well-developed.  HENT:     Head: Normocephalic.  Cardiovascular:     Rate and Rhythm: Normal rate.  Pulmonary:     Effort:  Pulmonary effort is normal.  Abdominal:     General: There is no distension.  Musculoskeletal:        General: Normal range of motion.     Cervical back: Normal range of motion.  Skin:    General: Skin is warm.  Neurological:     General: No focal deficit present.     Mental Status: He is alert and oriented to person, place, and time.     (all labs ordered are listed, but only abnormal results are displayed) Labs Reviewed  CBC  BASIC METABOLIC PANEL WITH GFR    EKG: None  Radiology: No results found.   Procedures   Medications Ordered in the ED  Chlorhexidine  Gluconate Cloth 2 % PADS 6 each (0 each Topical Hold 11/06/23 0834)                                    Medical Decision Making Patient presents requesting dialysis.  Amount and/or Complexity of Data Reviewed Labs: ordered. Decision-making details documented in ED Course.    Details: Labs ordered reviewed Discussion of management or test interpretation with external provider(s): Nephrologist consulted.        Final diagnoses:  ESRD (end stage renal disease) [facility])    ED Discharge  Orders     None          Flint Sonny POUR, PA-C 11/06/23 9152    Bernard Drivers, MD 11/06/23 [phone]

## 2023-11-06 NOTE — ED Provider Notes (Signed)
 Pt returns to the emergency department from dialysis.  Patient left prior to my evaluation.    Yolande Lamar BROCKS, MD 11/06/23 [phone]

## 2023-11-10 ENCOUNTER — Encounter (HOSPITAL_COMMUNITY): Payer: Self-pay

## 2023-11-10 ENCOUNTER — Other Ambulatory Visit: Payer: Self-pay

## 2023-11-10 ENCOUNTER — Emergency Department (HOSPITAL_COMMUNITY)
Admission: EM | Admit: 2023-11-10 | Discharge: 2023-11-10 | Discharge status: Against Medical Advice | Attending: Emergency Medicine | Admitting: Emergency Medicine

## 2023-11-10 DIAGNOSIS — Z5329 Procedure and treatment not carried out because of patient's decision for other reasons: Secondary | ICD-10-CM | POA: Insufficient documentation

## 2023-11-10 DIAGNOSIS — I120 Hypertensive chronic kidney disease with stage 5 chronic kidney disease or end stage renal disease: Secondary | ICD-10-CM | POA: Diagnosis not present

## 2023-11-10 DIAGNOSIS — N186 End stage renal disease: Secondary | ICD-10-CM | POA: Diagnosis not present

## 2023-11-10 DIAGNOSIS — I509 Heart failure, unspecified: Secondary | ICD-10-CM | POA: Diagnosis not present

## 2023-11-10 DIAGNOSIS — Z992 Dependence on renal dialysis: Secondary | ICD-10-CM | POA: Diagnosis not present

## 2023-11-10 DIAGNOSIS — I132 Hypertensive heart and chronic kidney disease with heart failure and with stage 5 chronic kidney disease, or end stage renal disease: Secondary | ICD-10-CM | POA: Diagnosis not present

## 2023-11-10 LAB — CBC WITH DIFFERENTIAL/PLATELET
Abs Immature Granulocytes: 0.06 K/uL (ref 0.00–0.07)
Basophils Absolute: 0 K/uL (ref 0.0–0.1)
Basophils Relative: 1 %
Eosinophils Absolute: 0.2 K/uL (ref 0.0–0.5)
Eosinophils Relative: 2 %
HCT: 23.9 % — ABNORMAL LOW (ref 39.0–52.0)
Hemoglobin: 7.9 g/dL — ABNORMAL LOW (ref 13.0–17.0)
Immature Granulocytes: 1 %
Lymphocytes Relative: 16 %
Lymphs Abs: 1.3 K/uL (ref 0.7–4.0)
MCH: 31.1 pg (ref 26.0–34.0)
MCHC: 33.1 g/dL (ref 30.0–36.0)
MCV: 94.1 fL (ref 80.0–100.0)
Monocytes Absolute: 0.8 K/uL (ref 0.1–1.0)
Monocytes Relative: 10 %
Neutro Abs: 5.8 K/uL (ref 1.7–7.7)
Neutrophils Relative %: 70 %
Platelets: 235 K/uL (ref 150–400)
RBC: 2.54 MIL/uL — ABNORMAL LOW (ref 4.22–5.81)
RDW: 15 % (ref 11.5–15.5)
WBC: 8.2 K/uL (ref 4.0–10.5)
nRBC: 0 % (ref 0.0–0.2)

## 2023-11-10 LAB — BASIC METABOLIC PANEL WITH GFR
Anion gap: 19 — ABNORMAL HIGH (ref 5–15)
BUN: 95 mg/dL — ABNORMAL HIGH (ref 6–20)
CO2: 19 mmol/L — ABNORMAL LOW (ref 22–32)
Calcium: 7 mg/dL — ABNORMAL LOW (ref 8.9–10.3)
Chloride: 102 mmol/L (ref 98–111)
Creatinine, Ser: 14.67 mg/dL — ABNORMAL HIGH (ref 0.61–1.24)
GFR, Estimated: 3 mL/min — ABNORMAL LOW (ref >60)
Glucose, Bld: 99 mg/dL (ref 70–99)
Potassium: 4.3 mmol/L (ref 3.5–5.1)
Sodium: 140 mmol/L (ref 135–145)

## 2023-11-10 MED ORDER — HEPARIN SODIUM (PORCINE) 1000 UNIT/ML DIALYSIS: 2400.0000 [IU] | Freq: Once | INTRAMUSCULAR | Status: DC

## 2023-11-10 MED ORDER — CHLORHEXIDINE GLUCONATE CLOTH 2 % EX PADS: 6.0000 | MEDICATED_PAD | Freq: Every day | CUTANEOUS | Status: DC

## 2023-11-10 NOTE — Procedures (Signed)
 Asked to see this patient for hospital dialysis. Pt was discharged from his outpatient unit about 4 mos ago for behavior issues. The plan will be for ED HD. Pt is not to be admitted at this time. Pt will go to the dialysis unit when they are ready for the patient. When dialysis is completed pt will be sent back to ED for reassessment.   I was present at the procedure, reviewed the HD regimen and made appropriate changes.   Myer Fret MD  [facility] 11/10/2023, 4:38 PM

## 2023-11-10 NOTE — ED Provider Notes (Signed)
 [place] EMERGENCY DEPARTMENT AT [facility] Provider Note   CSN: [id number] Arrival date & time: 11/10/23  9368     Patient presents with: needs dialysis   Paul [name] is a 58 y.o. male PMHx bipolar disorder, CHF, ESRD on MWF dialysis, schizophrenia, HTN who presents to ED for dialysis treatment. Patient is well known to ED d/t his dialysis treatments. Patient stating that he is starting to feel mildly SOB since he missed his Friday dialysis session, but denies any other acute concerns today.   HPI     Prior to Admission medications   Medication Sig Start Date End Date Taking? Authorizing Provider  ARIPiprazole  (ABILIFY ) 5 MG tablet Take 1 tablet (5 mg total) by mouth at bedtime. 10/28/23   Plovsky, Elna, MD  carvedilol  (COREG ) 6.25 MG tablet Take 1 tablet (6.25 mg total) by mouth 2 (two) times daily with a meal. 07/29/23 10/28/23  Tawkaliyar, Roya, DO  isosorbide  mononitrate (IMDUR ) 30 MG 24 hr tablet Take 1 tablet (30 mg total) by mouth daily. Patient not taking: Reported on 10/28/2023 10/25/23   [name], MD  rosuvastatin  (CRESTOR ) 5 MG tablet Take 1 tablet (5 mg total) by mouth daily. 03/24/23     sucroferric oxyhydroxide (VELPHORO ) 500 MG chewable tablet Chew 3 tablets (1,500 mg total) by mouth 3 (three) times daily with meals. Patient not taking: Reported on 10/28/2023 07/29/23   Tawkaliyar, Roya, DO    Allergies: Penicillin g    Review of Systems  Gastrointestinal:        Dialysis    Updated Vital Signs BP (!) 180/97 (BP Location: Left Arm)   Pulse 90   Temp (!) 97.5 F (36.4 C)   Resp 20   Ht 5' 3 (1.6 m)   Wt 79.6 kg   SpO2 100%   BMI 31.09 kg/m   Physical Exam Vitals and nursing note reviewed.  Constitutional:      General: He is not in acute distress.    Appearance: He is not ill-appearing or toxic-appearing.  HENT:     Head: Normocephalic and atraumatic.     Mouth/Throat:     Mouth: Mucous membranes are moist.  Eyes:      General: No scleral icterus.       Right eye: No discharge.        Left eye: No discharge.     Conjunctiva/sclera: Conjunctivae normal.  Cardiovascular:     Rate and Rhythm: Normal rate and regular rhythm.     Pulses: Normal pulses.     Heart sounds: Normal heart sounds. No murmur heard. Pulmonary:     Effort: Pulmonary effort is normal. No respiratory distress.     Breath sounds: Normal breath sounds. No wheezing, rhonchi or rales.  Abdominal:     General: Abdomen is flat.  Musculoskeletal:     Right lower leg: No edema.     Left lower leg: No edema.  Skin:    General: Skin is warm and dry.     Findings: No rash.  Neurological:     General: No focal deficit present.     Mental Status: He is alert and oriented to person, place, and time. Mental status is at baseline.  Psychiatric:        Mood and Affect: Mood normal.        Behavior: Behavior normal.     (all labs ordered are listed, but only abnormal results are displayed) Labs Reviewed  HEPATITIS B SURFACE ANTIGEN  HEPATITIS B SURFACE ANTIBODY, QUANTITATIVE    EKG: None  Radiology: No results found.   Procedures   Medications Ordered in the ED  Chlorhexidine  Gluconate Cloth 2 % PADS 6 each (has no administration in time range)                                    Medical Decision Making  This patient presents to the ED for concern of dialysis, this involves an extensive number of treatment options, and is a complaint that carries with it a high risk of complications and morbidity.  The differential diagnosis includes  need for dialysis, fluid overload, hyperkalemia, etc      Co morbidities that complicate the patient evaluation   bipolar disorder, CHF, ESRD on MWF dialysis, schizophrenia, HTN     Additional history obtained:   Last dialysis session 7/03       Problem List / ED Course / Critical interventions / Medication management   Patient here for dialysis. Endorses mild SOB which he states is  from missing dialysis session on Friday.  Physical exam reassuring.  Patient afebrile with stable vitals. I requested consultation with the nephrologist on-call, Dr. Geralynn - they agree to bring patient in for dialysis. They do not need labs in ED as patient will get these labs drawn during dialysis. Patient is currently stable and I expect he should be appropriate for discharge after he receives his dialysis session. I have reviewed the patients home medicines and have made adjustments as needed     Social Determinants of Health:   none     Final diagnoses:  ESRD on dialysis [facility])    ED Discharge Orders     None          Hoy Nidia FALCON, [place] 11/10/23 1001    Levander Houston, MD 11/11/23 [phone]

## 2023-11-10 NOTE — Progress Notes (Signed)
   11/10/23 1640  Vitals  Temp 97.9 F (36.6 C)  Pulse Rate 80  Resp 17  BP (!) 170/95  SpO2 100 %  O2 Device Room Air  Oxygen Therapy  Patient Activity (if Appropriate) In bed  Pulse Oximetry Type Continuous  Oximetry Probe Site Changed No  Post Treatment  Dialyzer Clearance Lightly streaked  Liters Processed 72  Fluid Removed (mL) 3000 mL  Tolerated HD Treatment Yes  AVG/AVF Arterial Site Held (minutes) 10 minutes  AVG/AVF Venous Site Held (minutes) 10 minutes   Received patient in bed to unit.  Alert and oriented.  Informed consent signed and in chart.   TX duration: 3  Patient tolerated well.  Transported back to the room  Alert, without acute distress.  Hand-off given to patient's nurse.   Access used: Yes Access issues: No  Total UF removed: 3000 Medication(s) given: No Medication given Post HD VS: See Above Grid Post HD weight: 81 kg   [name]

## 2023-11-10 NOTE — ED Triage Notes (Signed)
 Pt arrived form home via POV stating that he needs dialysis

## 2023-11-12 ENCOUNTER — Other Ambulatory Visit: Payer: Self-pay

## 2023-11-12 ENCOUNTER — Encounter (HOSPITAL_COMMUNITY): Payer: Self-pay | Admitting: Emergency Medicine

## 2023-11-12 ENCOUNTER — Emergency Department (HOSPITAL_COMMUNITY)
Admission: EM | Admit: 2023-11-12 | Discharge: 2023-11-12 | Discharge status: Against Medical Advice | Attending: Emergency Medicine | Admitting: Emergency Medicine

## 2023-11-12 DIAGNOSIS — N186 End stage renal disease: Secondary | ICD-10-CM | POA: Insufficient documentation

## 2023-11-12 DIAGNOSIS — Z5329 Procedure and treatment not carried out because of patient's decision for other reasons: Secondary | ICD-10-CM | POA: Diagnosis not present

## 2023-11-12 DIAGNOSIS — Z992 Dependence on renal dialysis: Secondary | ICD-10-CM | POA: Insufficient documentation

## 2023-11-12 DIAGNOSIS — I120 Hypertensive chronic kidney disease with stage 5 chronic kidney disease or end stage renal disease: Secondary | ICD-10-CM | POA: Diagnosis not present

## 2023-11-12 LAB — HEPATITIS B SURFACE ANTIGEN: Hepatitis B Surface Ag: NONREACTIVE

## 2023-11-12 MED ORDER — CHLORHEXIDINE GLUCONATE CLOTH 2 % EX PADS: 6.0000 | MEDICATED_PAD | Freq: Every day | CUTANEOUS | Status: DC

## 2023-11-12 MED ORDER — DARBEPOETIN ALFA 200 MCG/0.4ML IJ SOSY
200.0000 ug | PREFILLED_SYRINGE | Freq: Once | INTRAMUSCULAR | Status: DC
  Filled 2023-11-12: qty 0.4

## 2023-11-12 NOTE — ED Notes (Signed)
 ED Provider at bedside.

## 2023-11-12 NOTE — ED Triage Notes (Signed)
 Patient arrives POV stating he is here for dialysis. MWF is his regularly scheduled day. No complaints at this time.

## 2023-11-12 NOTE — ED Provider Notes (Signed)
 [place] EMERGENCY DEPARTMENT AT [facility] Provider Note   CSN: [id number] Arrival date & time: 11/12/23  9358     Patient presents with: Needs Dialysis    Paul [name] is a 58 y.o. male.   Patient is a 58 year old male with a history of end-stage renal disease on dialysis Monday Wednesday Friday who is well-known to our department who is presenting today for his scheduled dialysis.  He has no complaints at this time.  The history is provided by the patient and medical records.       Prior to Admission medications   Medication Sig Start Date End Date Taking? Authorizing Provider  ARIPiprazole  (ABILIFY ) 5 MG tablet Take 1 tablet (5 mg total) by mouth at bedtime. 10/28/23   Plovsky, Elna, MD  carvedilol  (COREG ) 6.25 MG tablet Take 1 tablet (6.25 mg total) by mouth 2 (two) times daily with a meal. 07/29/23 10/28/23  Tawkaliyar, Roya, DO  isosorbide  mononitrate (IMDUR ) 30 MG 24 hr tablet Take 1 tablet (30 mg total) by mouth daily. Patient not taking: Reported on 10/28/2023 10/25/23   [name], MD  rosuvastatin  (CRESTOR ) 5 MG tablet Take 1 tablet (5 mg total) by mouth daily. 03/24/23     sucroferric oxyhydroxide (VELPHORO ) 500 MG chewable tablet Chew 3 tablets (1,500 mg total) by mouth 3 (three) times daily with meals. Patient not taking: Reported on 10/28/2023 07/29/23   Tawkaliyar, Roya, DO    Allergies: Penicillin g    Review of Systems  Updated Vital Signs BP (!) 190/112 (BP Location: Left Arm)   Pulse 96   Temp 98.5 F (36.9 C)   Resp 15   SpO2 99%   Physical Exam Vitals and nursing note reviewed.  Constitutional:      General: He is not in acute distress.    Appearance: He is well-developed.     Comments: Sleeping and in no acute distress  HENT:     Head: Normocephalic and atraumatic.  Eyes:     Conjunctiva/sclera: Conjunctivae normal.     Pupils: Pupils are equal, round, and reactive to light.  Cardiovascular:     Rate and Rhythm:  Normal rate and regular rhythm.     Heart sounds: No murmur heard. Pulmonary:     Effort: Pulmonary effort is normal. No respiratory distress.     Breath sounds: Normal breath sounds. No wheezing or rales.     Comments: Laying flat on the bed without shortness of breath Abdominal:     General: There is no distension.     Palpations: Abdomen is soft.     Tenderness: There is no abdominal tenderness. There is no guarding or rebound.  Musculoskeletal:     Cervical back: Normal range of motion and neck supple.  Skin:    General: Skin is warm and dry.     Findings: No erythema or rash.  Neurological:     Mental Status: He is oriented to person, place, and time.  Psychiatric:        Behavior: Behavior normal.     (all labs ordered are listed, but only abnormal results are displayed) Labs Reviewed - No data to display  EKG: None  Radiology: No results found.   Procedures   Medications Ordered in the ED - No data to display  Medical Decision Making  Patient presenting today for his scheduled dialysis.  He is noted to be hypertensive upon arrival but has no complaints at this time.  Consulted with Dr. Geralynn with nephrology who will get patient on the schedule for dialysis.     Final diagnoses:  ESRD needing dialysis [facility])    ED Discharge Orders     None          Doretha Folks, MD 11/12/23 1527

## 2023-11-12 NOTE — ED Notes (Signed)
 Finished with dialysis then eloped

## 2023-11-12 NOTE — Procedures (Signed)
 Asked to see this patient for hospital dialysis. The plan will be for ED HD. Pt is not to be admitted at this time. Pt will go to the dialysis unit when they are ready for the patient. When dialysis is completed pt will be sent back to ED for reassessment.    Last OP HD unit info --> pt released 07/2023 from [facility] 4h  B400  76.5kg  2K bath    RUA AVF  Heparin  2400    Clinical issues other than volume: 1) Anemia of esrd:  - pt dose not have a nephrologist or HD unit so does not have access to ESA or IV iron  other than when here for hospital HD sessions - recent Hb in the 7.9- 10  range - last 3 doses of esa were: - darbe 100 mcg 09/22/23 - darbe 100 mcg 6/06 - darbe 200 mcg 10/17/23 - last tsat 24% and ferritin 1828 on 09/05/23-> ferritin too high to give IV fe - plan --> darbepoetin 200 mcg sq once today   I was present at the procedure, reviewed the HD regimen and made appropriate changes.   Myer Fret MD  [facility] 11/12/2023, 8:06 AM

## 2023-11-12 NOTE — ED Notes (Signed)
 PT given a diet order and a snack bag with a ginger ale.

## 2023-11-12 NOTE — ED Notes (Signed)
 Report given to dialysis floor.PT taken to dialysis

## 2023-11-12 NOTE — Progress Notes (Signed)
 Received patient in bed to unit.  Alert and oriented.  Informed consent signed and in chart.   TX duration:  Patient tolerated well.  Transported back to the room  Alert, without acute distress.  Hand-off given to patient's nurse.   Access used: AVF Access issues: n/a  Total UF removed: 3L Medication(s) given: n/a Post HD weight: 80.8kg    Paul [name]

## 2023-11-14 ENCOUNTER — Encounter (HOSPITAL_COMMUNITY): Payer: Self-pay | Admitting: Emergency Medicine

## 2023-11-14 ENCOUNTER — Other Ambulatory Visit: Payer: Self-pay

## 2023-11-14 ENCOUNTER — Emergency Department (HOSPITAL_COMMUNITY)
Admission: EM | Admit: 2023-11-14 | Discharge: 2023-11-14 | Discharge status: Against Medical Advice | Attending: Emergency Medicine | Admitting: Emergency Medicine

## 2023-11-14 DIAGNOSIS — Z5329 Procedure and treatment not carried out because of patient's decision for other reasons: Secondary | ICD-10-CM | POA: Insufficient documentation

## 2023-11-14 DIAGNOSIS — I120 Hypertensive chronic kidney disease with stage 5 chronic kidney disease or end stage renal disease: Secondary | ICD-10-CM | POA: Diagnosis not present

## 2023-11-14 DIAGNOSIS — N186 End stage renal disease: Secondary | ICD-10-CM | POA: Insufficient documentation

## 2023-11-14 DIAGNOSIS — Z992 Dependence on renal dialysis: Secondary | ICD-10-CM | POA: Insufficient documentation

## 2023-11-14 LAB — CBC WITH DIFFERENTIAL/PLATELET
Abs Immature Granulocytes: 0.04 K/uL (ref 0.00–0.07)
Basophils Absolute: 0 K/uL (ref 0.0–0.1)
Basophils Relative: 0 %
Eosinophils Absolute: 0.2 K/uL (ref 0.0–0.5)
Eosinophils Relative: 3 %
HCT: 23.7 % — ABNORMAL LOW (ref 39.0–52.0)
Hemoglobin: 8.1 g/dL — ABNORMAL LOW (ref 13.0–17.0)
Immature Granulocytes: 1 %
Lymphocytes Relative: 14 %
Lymphs Abs: 1.1 K/uL (ref 0.7–4.0)
MCH: 32 pg (ref 26.0–34.0)
MCHC: 34.2 g/dL (ref 30.0–36.0)
MCV: 93.7 fL (ref 80.0–100.0)
Monocytes Absolute: 1 K/uL (ref 0.1–1.0)
Monocytes Relative: 13 %
Neutro Abs: 5.3 K/uL (ref 1.7–7.7)
Neutrophils Relative %: 69 %
Platelets: 222 K/uL (ref 150–400)
RBC: 2.53 MIL/uL — ABNORMAL LOW (ref 4.22–5.81)
RDW: 15.3 % (ref 11.5–15.5)
WBC: 7.6 K/uL (ref 4.0–10.5)
nRBC: 0 % (ref 0.0–0.2)

## 2023-11-14 LAB — RENAL FUNCTION PANEL
Albumin: 3.3 g/dL — ABNORMAL LOW (ref 3.5–5.0)
Anion gap: 19 — ABNORMAL HIGH (ref 5–15)
BUN: 84 mg/dL — ABNORMAL HIGH (ref 6–20)
CO2: 19 mmol/L — ABNORMAL LOW (ref 22–32)
Calcium: 7.2 mg/dL — ABNORMAL LOW (ref 8.9–10.3)
Chloride: 99 mmol/L (ref 98–111)
Creatinine, Ser: 10.55 mg/dL — ABNORMAL HIGH (ref 0.61–1.24)
GFR, Estimated: 5 mL/min — ABNORMAL LOW (ref >60)
Glucose, Bld: 90 mg/dL (ref 70–99)
Phosphorus: 10.9 mg/dL — ABNORMAL HIGH (ref 2.5–4.6)
Potassium: 5 mmol/L (ref 3.5–5.1)
Sodium: 137 mmol/L (ref 135–145)

## 2023-11-14 MED ORDER — NEPRO/CARBSTEADY PO LIQD: 237.0000 mL | ORAL | Status: DC | PRN

## 2023-11-14 MED ORDER — LIDOCAINE HCL (PF) 1 % IJ SOLN: 5.0000 mL | INTRAMUSCULAR | Status: DC | PRN

## 2023-11-14 MED ORDER — HEPARIN SODIUM (PORCINE) 1000 UNIT/ML DIALYSIS: 2600.0000 [IU] | Freq: Once | INTRAMUSCULAR | Status: DC

## 2023-11-14 MED ORDER — PENTAFLUOROPROP-TETRAFLUOROETH EX AERO: 1.0000 | INHALATION_SPRAY | CUTANEOUS | Status: DC | PRN

## 2023-11-14 MED ORDER — ANTICOAGULANT SODIUM CITRATE 4% (200MG/5ML) IV SOLN: 5.0000 mL | Status: DC | PRN

## 2023-11-14 MED ORDER — CHLORHEXIDINE GLUCONATE CLOTH 2 % EX PADS: 6.0000 | MEDICATED_PAD | Freq: Every day | CUTANEOUS | Status: DC

## 2023-11-14 MED ORDER — DARBEPOETIN ALFA 200 MCG/0.4ML IJ SOSY
200.0000 ug | PREFILLED_SYRINGE | Freq: Once | INTRAMUSCULAR | Status: DC
  Filled 2023-11-14: qty 0.4

## 2023-11-14 MED ORDER — HEPARIN SODIUM (PORCINE) 1000 UNIT/ML DIALYSIS: 1000.0000 [IU] | INTRAMUSCULAR | Status: DC | PRN

## 2023-11-14 MED ORDER — ALTEPLASE 2 MG IJ SOLR: 2.0000 mg | Freq: Once | INTRAMUSCULAR | Status: DC | PRN

## 2023-11-14 MED ORDER — LIDOCAINE-PRILOCAINE 2.5-2.5 % EX CREA: 1.0000 | TOPICAL_CREAM | CUTANEOUS | Status: DC | PRN

## 2023-11-14 NOTE — Procedures (Addendum)
 Goal increased to 3 L per PA verbal order.

## 2023-11-14 NOTE — ED Provider Notes (Signed)
 [place] EMERGENCY DEPARTMENT AT [facility] Provider Note   CSN: [id number] Arrival date & time: 11/14/23  9346     Patient presents with: Needs Dialysis   Paul [name] is a 58 y.o. male.  {Add pertinent medical, surgical, social history, OB history to YEP:67052} The history is provided by the patient. No language interpreter was used.     58 year old male history of end-stage renal disease currently on MWF dialysis schedule presents today requesting for dialysis.  Patient is well-known to the ED and come to the ER to get dialyzed.  He is without any other complaint.  Prior to Admission medications   Medication Sig Start Date End Date Taking? Authorizing Provider  ARIPiprazole  (ABILIFY ) 5 MG tablet Take 1 tablet (5 mg total) by mouth at bedtime. 10/28/23   Plovsky, Elna, MD  carvedilol  (COREG ) 6.25 MG tablet Take 1 tablet (6.25 mg total) by mouth 2 (two) times daily with a meal. 07/29/23 10/28/23  Tawkaliyar, Roya, DO  isosorbide  mononitrate (IMDUR ) 30 MG 24 hr tablet Take 1 tablet (30 mg total) by mouth daily. Patient not taking: Reported on 10/28/2023 10/25/23   [name], MD  rosuvastatin  (CRESTOR ) 5 MG tablet Take 1 tablet (5 mg total) by mouth daily. 03/24/23     sucroferric oxyhydroxide (VELPHORO ) 500 MG chewable tablet Chew 3 tablets (1,500 mg total) by mouth 3 (three) times daily with meals. Patient not taking: Reported on 10/28/2023 07/29/23   Tawkaliyar, Roya, DO    Allergies: Penicillin g    Review of Systems  All other systems reviewed and are negative.   Updated Vital Signs BP (!) 175/97   Pulse 92   Temp 97.8 F (36.6 C)   Resp 18   Wt 81 kg   SpO2 100%   BMI 31.63 kg/m   Physical Exam Constitutional:      General: He is not in acute distress.    Appearance: He is well-developed.  HENT:     Head: Atraumatic.  Eyes:     Conjunctiva/sclera: Conjunctivae normal.  Cardiovascular:     Rate and Rhythm: Normal rate and regular  rhythm.     Pulses: Normal pulses.     Heart sounds: Normal heart sounds.  Pulmonary:     Effort: Pulmonary effort is normal.     Breath sounds: Normal breath sounds. No wheezing, rhonchi or rales.  Abdominal:     Palpations: Abdomen is soft.  Musculoskeletal:     Cervical back: Normal range of motion and neck supple.  Skin:    Findings: No rash.  Neurological:     Mental Status: He is alert.     (all labs ordered are listed, but only abnormal results are displayed) Labs Reviewed - No data to display  EKG: None  Radiology: No results found.  {Document cardiac monitor, telemetry assessment procedure when appropriate:32947} Procedures   Medications Ordered in the ED - No data to display    {Click here for ABCD2, HEART and other calculators REFRESH Note before signing:1}                              Medical Decision Making Amount and/or Complexity of Data Reviewed Labs: ordered.   BP (!) 175/97   Pulse 92   Temp 97.8 F (36.6 C)   Resp 18   Wt 81 kg   SpO2 100%   BMI 31.63 kg/m   67:50 AM 58 year old male  history of end-stage renal disease currently on MWF dialysis schedule presents today requesting for dialysis.  Patient is well-known to the ED and come to the ER to get dialyzed.  He is without any other complaint.  Patient is resting comfortably in no acute discomfort.  Lungs clear  7:43 AM I have notified nephrologist DR. Schertz who will aid with dialysis  {Document critical care time when appropriate  Document review of labs and clinical decision tools ie CHADS2VASC2, etc  Document your independent review of radiology images and any outside records  Document your discussion with family members, caretakers and with consultants  Document social determinants of health affecting pt's care  Document your decision making why or why not admission, treatments were needed:32947:::1}   Final diagnoses:  None    ED Discharge Orders     None

## 2023-11-14 NOTE — ED Triage Notes (Signed)
 Patient here for dialysis.  MWF, last got dialysis on Wednesday.  Patient denies complaints at this time.

## 2023-11-14 NOTE — Procedures (Addendum)
 Received patient in bed to unit.  Alert and oriented.  Informed consent signed and in chart.   TX duration: 2 hours 47 min  Patient tolerated well.  Transported back to the ED.  Alert, without acute distress.  Hand-off given to patient's nurse.   Access used: right arm fistula Access issues: none  Total UF removed: 2.5 liters Medication(s) given: none  Greig Silvan, RN [facility]

## 2023-11-14 NOTE — Procedures (Signed)
 Patient called this nurse over to him, nurse went to patients side and patient was unresponsive. CN notified. BP 81/61 Rinsed patient back. Sternal rub performed. Patient woke up and responding. 200 ml NS bolus given. BP 104/78 Nephrology PA notified.

## 2023-11-14 NOTE — ED Notes (Signed)
 Pt left after returning from dialysis without being seen by provider.

## 2023-11-17 ENCOUNTER — Emergency Department (HOSPITAL_COMMUNITY)
Admission: EM | Admit: 2023-11-17 | Discharge: 2023-11-17 | Discharge status: Against Medical Advice | Attending: Emergency Medicine | Admitting: Emergency Medicine

## 2023-11-17 ENCOUNTER — Other Ambulatory Visit: Payer: Self-pay

## 2023-11-17 ENCOUNTER — Other Ambulatory Visit (HOSPITAL_BASED_OUTPATIENT_CLINIC_OR_DEPARTMENT_OTHER): Payer: Self-pay

## 2023-11-17 ENCOUNTER — Encounter (HOSPITAL_COMMUNITY): Payer: Self-pay

## 2023-11-17 DIAGNOSIS — Z992 Dependence on renal dialysis: Secondary | ICD-10-CM | POA: Insufficient documentation

## 2023-11-17 DIAGNOSIS — I120 Hypertensive chronic kidney disease with stage 5 chronic kidney disease or end stage renal disease: Secondary | ICD-10-CM | POA: Diagnosis not present

## 2023-11-17 DIAGNOSIS — R0602 Shortness of breath: Secondary | ICD-10-CM | POA: Diagnosis present

## 2023-11-17 DIAGNOSIS — N186 End stage renal disease: Secondary | ICD-10-CM | POA: Insufficient documentation

## 2023-11-17 LAB — HEPATITIS B SURFACE ANTIGEN: Hepatitis B Surface Ag: NONREACTIVE

## 2023-11-17 MED ORDER — CHLORHEXIDINE GLUCONATE CLOTH 2 % EX PADS: 6.0000 | MEDICATED_PAD | Freq: Every day | CUTANEOUS | Status: DC

## 2023-11-17 NOTE — ED Provider Notes (Signed)
 [place] EMERGENCY DEPARTMENT AT [facility] Provider Note   CSN: [id number] Arrival date & time: 11/17/23  9375     Patient presents with: No chief complaint on file.   Paul [name] is a 58 y.o. male.  Patient with past history of ESRD here for dialysis.  Patient is well-known to the emergency department and continually receives his Monday, Wednesday, Friday dialysis sessions to the emergency department.  Endorses some slight shortness of breath with ambulation but denies any feelings of shortness of breath at rest.  States that he thinks he likely drink more than his fluid restricted amount.  HPI     Prior to Admission medications   Medication Sig Start Date End Date Taking? Authorizing Provider  ARIPiprazole  (ABILIFY ) 5 MG tablet Take 1 tablet (5 mg total) by mouth at bedtime. 10/28/23   Plovsky, Elna, MD  carvedilol  (COREG ) 6.25 MG tablet Take 1 tablet (6.25 mg total) by mouth 2 (two) times daily with a meal. 07/29/23 10/28/23  Tawkaliyar, Roya, DO  isosorbide  mononitrate (IMDUR ) 30 MG 24 hr tablet Take 1 tablet (30 mg total) by mouth daily. Patient not taking: Reported on 10/28/2023 10/25/23   [name], MD  rosuvastatin  (CRESTOR ) 5 MG tablet Take 1 tablet (5 mg total) by mouth daily. 03/24/23     sucroferric oxyhydroxide (VELPHORO ) 500 MG chewable tablet Chew 3 tablets (1,500 mg total) by mouth 3 (three) times daily with meals. Patient not taking: Reported on 10/28/2023 07/29/23   Tawkaliyar, Roya, DO    Allergies: Penicillin g    Review of Systems  Constitutional:  Negative for appetite change.  All other systems reviewed and are negative.   Updated Vital Signs BP (!) 177/100   Pulse 90   Temp 98.3 F (36.8 C)   Resp 16   Ht 5' 3 (1.6 m)   Wt 79 kg   SpO2 97%   BMI 30.85 kg/m   Physical Exam Vitals and nursing note reviewed.  Constitutional:      General: He is not in acute distress.    Appearance: He is well-developed.  HENT:      Head: Normocephalic and atraumatic.  Eyes:     Conjunctiva/sclera: Conjunctivae normal.  Cardiovascular:     Rate and Rhythm: Normal rate and regular rhythm.     Heart sounds: No murmur heard. Pulmonary:     Effort: Pulmonary effort is normal. No respiratory distress.     Breath sounds: Normal breath sounds. No wheezing or rales.  Abdominal:     General: Abdomen is flat. Bowel sounds are normal.     Palpations: Abdomen is soft.     Tenderness: There is no abdominal tenderness.  Musculoskeletal:        General: No swelling.     Cervical back: Neck supple.     Right lower leg: No edema.     Left lower leg: No edema.  Skin:    General: Skin is warm and dry.     Capillary Refill: Capillary refill takes less than 2 seconds.  Neurological:     Mental Status: He is alert.  Psychiatric:        Mood and Affect: Mood normal.     (all labs ordered are listed, but only abnormal results are displayed) Labs Reviewed  HEPATITIS B SURFACE ANTIGEN  HEPATITIS B SURFACE ANTIBODY, QUANTITATIVE    EKG: None  Radiology: No results found.   Procedures   Medications Ordered in the ED  Chlorhexidine   Gluconate Cloth 2 % PADS 6 each (has no administration in time range)                                    Medical Decision Making  Problem List / ED Course:  Patient well known to the ED presents for dialysis. He receives dialysis MWF and has been compliant with sessions for several weeks. Here for dialysis today. Endorses slight SOB when walking but none at rest. He is attributing this to likely exceeding fluid restriction amounts. No other reported symptoms. Consulted with Dr. Jerrye, nephrology, who will coordinate for dialysis session today. Patient likely stable to discharge after dialysis session completed.  Final diagnoses:  ESRD needing dialysis [facility])    ED Discharge Orders     None          Cecily Legrand LABOR, PA-C 11/17/23 1431    Freddi Hamilton, MD 11/21/23  1502

## 2023-11-17 NOTE — ED Notes (Signed)
 HD nurse called to say the pt would not get HD today. Pt notified, pt then stated that he would like to leave and that he would come back tomorrow.

## 2023-11-17 NOTE — ED Triage Notes (Signed)
 Pt coming in today for dialysis . Pt reports last dialysis on Friday.

## 2023-11-18 ENCOUNTER — Other Ambulatory Visit: Payer: Self-pay

## 2023-11-18 ENCOUNTER — Encounter (HOSPITAL_COMMUNITY): Payer: Self-pay | Admitting: Pharmacy Technician

## 2023-11-18 ENCOUNTER — Emergency Department (HOSPITAL_COMMUNITY)
Admission: EM | Admit: 2023-11-18 | Discharge: 2023-11-18 | Discharge status: Against Medical Advice | Attending: Emergency Medicine | Admitting: Emergency Medicine

## 2023-11-18 DIAGNOSIS — I120 Hypertensive chronic kidney disease with stage 5 chronic kidney disease or end stage renal disease: Secondary | ICD-10-CM | POA: Diagnosis not present

## 2023-11-18 DIAGNOSIS — D631 Anemia in chronic kidney disease: Secondary | ICD-10-CM | POA: Diagnosis not present

## 2023-11-18 DIAGNOSIS — H54413A Blindness right eye category 3, normal vision left eye: Secondary | ICD-10-CM | POA: Insufficient documentation

## 2023-11-18 DIAGNOSIS — Z992 Dependence on renal dialysis: Secondary | ICD-10-CM | POA: Diagnosis not present

## 2023-11-18 DIAGNOSIS — Z5329 Procedure and treatment not carried out because of patient's decision for other reasons: Secondary | ICD-10-CM | POA: Diagnosis not present

## 2023-11-18 DIAGNOSIS — I509 Heart failure, unspecified: Secondary | ICD-10-CM | POA: Insufficient documentation

## 2023-11-18 DIAGNOSIS — N186 End stage renal disease: Secondary | ICD-10-CM | POA: Diagnosis not present

## 2023-11-18 DIAGNOSIS — I132 Hypertensive heart and chronic kidney disease with heart failure and with stage 5 chronic kidney disease, or end stage renal disease: Secondary | ICD-10-CM | POA: Insufficient documentation

## 2023-11-18 LAB — CBC
HCT: 23.9 % — ABNORMAL LOW (ref 39.0–52.0)
Hemoglobin: 8 g/dL — ABNORMAL LOW (ref 13.0–17.0)
MCH: 31.1 pg (ref 26.0–34.0)
MCHC: 33.5 g/dL (ref 30.0–36.0)
MCV: 93 fL (ref 80.0–100.0)
Platelets: 251 K/uL (ref 150–400)
RBC: 2.57 MIL/uL — ABNORMAL LOW (ref 4.22–5.81)
RDW: 15.5 % (ref 11.5–15.5)
WBC: 10.8 K/uL — ABNORMAL HIGH (ref 4.0–10.5)
nRBC: 0 % (ref 0.0–0.2)

## 2023-11-18 LAB — RENAL FUNCTION PANEL
Albumin: 3.7 g/dL (ref 3.5–5.0)
Anion gap: 24 — ABNORMAL HIGH (ref 5–15)
BUN: 115 mg/dL — ABNORMAL HIGH (ref 6–20)
CO2: 13 mmol/L — ABNORMAL LOW (ref 22–32)
Calcium: 6.6 mg/dL — ABNORMAL LOW (ref 8.9–10.3)
Chloride: 101 mmol/L (ref 98–111)
Creatinine, Ser: 14.82 mg/dL — ABNORMAL HIGH (ref 0.61–1.24)
GFR, Estimated: 3 mL/min — ABNORMAL LOW (ref >60)
Glucose, Bld: 91 mg/dL (ref 70–99)
Phosphorus: >30 mg/dL — ABNORMAL HIGH (ref 2.5–4.6)
Potassium: 6 mmol/L — ABNORMAL HIGH (ref 3.5–5.1)
Sodium: 138 mmol/L (ref 135–145)

## 2023-11-18 LAB — HEPATITIS B SURFACE ANTIBODY, QUANTITATIVE: Hep B S AB Quant (Post): 6424 m[IU]/mL

## 2023-11-18 MED ORDER — HEPARIN SODIUM (PORCINE) 1000 UNIT/ML DIALYSIS
1000.0000 [IU] | INTRAMUSCULAR | Status: DC | PRN
Start: 2023-11-18 — End: 2023-11-18

## 2023-11-18 MED ORDER — CALCIUM GLUCONATE-NACL 1-0.675 GM/50ML-% IV SOLN
1.0000 g | Freq: Once | INTRAVENOUS | Status: AC
  Administered 2023-11-18: 1000 mg via INTRAVENOUS
  Filled 2023-11-18: qty 50

## 2023-11-18 MED ORDER — DARBEPOETIN ALFA 100 MCG/0.5ML IJ SOSY
100.0000 ug | PREFILLED_SYRINGE | Freq: Once | INTRAMUSCULAR | Status: AC
  Administered 2023-11-18: 100 ug via SUBCUTANEOUS
  Filled 2023-11-18: qty 0.5

## 2023-11-18 MED ORDER — PENTAFLUOROPROP-TETRAFLUOROETH EX AERO: 1.0000 | INHALATION_SPRAY | CUTANEOUS | Status: DC | PRN

## 2023-11-18 MED ORDER — NEPRO/CARBSTEADY PO LIQD: 237.0000 mL | ORAL | Status: DC | PRN

## 2023-11-18 MED ORDER — ANTICOAGULANT SODIUM CITRATE 4% (200MG/5ML) IV SOLN: 5.0000 mL | Status: DC | PRN

## 2023-11-18 MED ORDER — LIDOCAINE HCL (PF) 1 % IJ SOLN: 5.0000 mL | INTRAMUSCULAR | Status: DC | PRN

## 2023-11-18 MED ORDER — LIDOCAINE-PRILOCAINE 2.5-2.5 % EX CREA: 1.0000 | TOPICAL_CREAM | CUTANEOUS | Status: DC | PRN

## 2023-11-18 MED ORDER — CHLORHEXIDINE GLUCONATE CLOTH 2 % EX PADS: 6.0000 | MEDICATED_PAD | Freq: Every day | CUTANEOUS | Status: DC

## 2023-11-18 MED ORDER — ALTEPLASE 2 MG IJ SOLR: 2.0000 mg | Freq: Once | INTRAMUSCULAR | Status: DC | PRN

## 2023-11-18 NOTE — ED Notes (Signed)
 Pt left after being brought back from dialysis. Pt encouraged to stay but declined.

## 2023-11-18 NOTE — ED Triage Notes (Signed)
 Pt here for dialysis. States came yesterday but they were unable to do his tx.

## 2023-11-18 NOTE — Progress Notes (Signed)
 Navigator received a voicemail from pt last evening and returned call to pt this morning. Pt did not answer and was unable to leave a message for pt due to voicemail being full. Will continue to try to reach pt as able. Pt continues to not have an accepting nephrologist or out-pt HD clinic. Plan to make a referral to new [facility] in [place] once clinic is taking new pts.   Randine Mungo Renal Navigator [phone]

## 2023-11-18 NOTE — Progress Notes (Signed)
 Received patient in bed to unit.  Alert and oriented.  Informed consent signed and in chart.   TX duration:3 hours  Patient tolerated well.  Transported back to the room  Alert, without acute distress.  Hand-off given to patient's nurse.   Access used: Right AV fistula Upper arm Access issues: none  Total UF removed: 3L Medication(s) given: Arenesp, Calcium  gluconate   11/18/23 1713  Vitals  Temp 97.6 F (36.4 C)  Temp Source Oral  BP (!) 146/96 (Simultaneous filing. User may not have seen previous data.)  MAP (mmHg) 111  Pulse Rate 95 (Simultaneous filing. User may not have seen previous data.)  Resp 19  Oxygen Therapy  SpO2 100 %  O2 Device Room Air  Patient Activity (if Appropriate) In bed  Pulse Oximetry Type Continuous  Oximetry Probe Site Changed No  During Treatment Monitoring  Blood Flow Rate (mL/min) 399 mL/min  Arterial Pressure (mmHg) -146.05 mmHg  Venous Pressure (mmHg) 338.16 mmHg  TMP (mmHg) 32.93 mmHg  Ultrafiltration Rate (mL/min) 1263 mL/min  Dialysate Flow Rate (mL/min) 299 ml/min  Dialysate Potassium Concentration 2  Dialysate Calcium  Concentration 2.5  Duration of HD Treatment -hour(s) 3 hour(s) (Simultaneous filing. User may not have seen previous data.)  Cumulative Fluid Removed (mL) per Treatment  2996.26  HD Safety Checks Performed Yes (Simultaneous filing. User may not have seen previous data.)  Intra-Hemodialysis Comments Tx completed (Simultaneous filing. User may not have seen previous data.)  Dialysis Fluid Bolus Normal Saline (Simultaneous filing. User may not have seen previous data.)  Bolus Amount (mL) 300 mL (Simultaneous filing. User may not have seen previous data.)  Fistula / Graft Right Upper arm Arteriovenous fistula  No placement date or time found.   Placed prior to admission: Yes  Orientation: Right  Access Location: Upper arm  Access Type: Arteriovenous fistula  Status Deaccessed     Camellia Brasil LPN [facility]

## 2023-11-18 NOTE — ED Provider Notes (Signed)
 [name] EMERGENCY DEPARTMENT AT [facility] Provider Note   CSN: [id number] Arrival date & time: 11/18/23  9279     Patient presents with: No chief complaint on file.   Paul [name] is a 58 y.o. male.   Pt is a 58 yo male with pmhx significant for ESRD on HD, HTN, HLD, CHF, CRAO, bipolar d/o and schizophrenia.  Pt has no outpatient dialysis center, so he comes here for dialysis.  He normally comes MWF and came yesterday and waited until close to 3 pm when the dialysis unit called and said they could not fit him in and he needed to come back today.  He is a little sob today, but otherwise feels ok.  Last dialysis was on Friday, July 11.       Prior to Admission medications   Medication Sig Start Date End Date Taking? Authorizing Provider  ARIPiprazole  (ABILIFY ) 5 MG tablet Take 1 tablet (5 mg total) by mouth at bedtime. 10/28/23   Plovsky, Elna, MD  carvedilol  (COREG ) 6.25 MG tablet Take 1 tablet (6.25 mg total) by mouth 2 (two) times daily with a meal. 07/29/23 10/28/23  Tawkaliyar, Roya, DO  isosorbide  mononitrate (IMDUR ) 30 MG 24 hr tablet Take 1 tablet (30 mg total) by mouth daily. Patient not taking: Reported on 10/28/2023 10/25/23   [name], MD  rosuvastatin  (CRESTOR ) 5 MG tablet Take 1 tablet (5 mg total) by mouth daily. 03/24/23     sucroferric oxyhydroxide (VELPHORO ) 500 MG chewable tablet Chew 3 tablets (1,500 mg total) by mouth 3 (three) times daily with meals. Patient not taking: Reported on 10/28/2023 07/29/23   Tawkaliyar, Roya, DO    Allergies: Penicillin g    Review of Systems  Respiratory:  Positive for shortness of breath.   All other systems reviewed and are negative.   Updated Vital Signs BP (!) 157/93 (BP Location: Left Arm)   Pulse 85   Temp 97.9 F (36.6 C) (Oral)   Resp 18   SpO2 97%   Physical Exam Vitals and nursing note reviewed.  Constitutional:      Appearance: Normal appearance.  HENT:     Head: Normocephalic and  atraumatic.     Right Ear: External ear normal.     Left Ear: External ear normal.     Nose: Nose normal.     Mouth/Throat:     Mouth: Mucous membranes are moist.     Pharynx: Oropharynx is clear.  Eyes:     Extraocular Movements: Extraocular movements intact.     Conjunctiva/sclera: Conjunctivae normal.     Comments: Right eye blind  Cardiovascular:     Rate and Rhythm: Normal rate and regular rhythm.     Pulses: Normal pulses.     Heart sounds: Normal heart sounds.  Pulmonary:     Effort: Pulmonary effort is normal.     Breath sounds: Normal breath sounds.  Abdominal:     General: Abdomen is flat. Bowel sounds are normal.     Palpations: Abdomen is soft.  Musculoskeletal:        General: Normal range of motion.     Cervical back: Normal range of motion and neck supple.  Skin:    General: Skin is warm.     Capillary Refill: Capillary refill takes less than 2 seconds.  Neurological:     General: No focal deficit present.     Mental Status: He is alert and oriented to person, place, and time.  Psychiatric:        Mood and Affect: Mood normal.        Behavior: Behavior normal.     (all labs ordered are listed, but only abnormal results are displayed) Labs Reviewed - No data to display  EKG: None  Radiology: No results found.   Procedures   Medications Ordered in the ED - No data to display                                  Medical Decision Making  This patient presents to the ED for concern of needing dialysis, this involves an extensive number of treatment options, and is a complaint that carries with it a high risk of complications and morbidity.  The differential diagnosis includes ESRD on HD   Co morbidities that complicate the patient evaluation  SRD on HD, HTN, HLD, CHF, CRAO, bipolar d/o and schizophrenia   Additional history obtained:  Additional history obtained from epic chart review  Critical Interventions:  dialysis   Consultations  Obtained:  I requested consultation with the nephrologist (Dr. Jerrye),  and discussed lab and imaging findings as well as pertinent plan -she will take him to dialysis   Problem List / ED Course:  ESRD on HD:  pt needs dialysis today.  I anticipate d/c after dialysis.   Social Determinants of Health:  Lives at home   Dispostion:  After consideration of the diagnostic results and the patients response to treatment, I feel that the patent would benefit from dialysis then likely d/c.       Final diagnoses:  ESRD on hemodialysis [facility])    ED Discharge Orders     None          Dean Clarity, MD 11/18/23 [phone]

## 2023-11-19 LAB — HEPATITIS B SURFACE ANTIBODY, QUANTITATIVE: Hep B S AB Quant (Post): 6139 m[IU]/mL

## 2023-11-21 ENCOUNTER — Other Ambulatory Visit: Payer: Self-pay

## 2023-11-21 ENCOUNTER — Emergency Department (HOSPITAL_COMMUNITY): Admission: EM | Admit: 2023-11-21 | Discharge: 2023-11-22 | Discharge status: Against Medical Advice

## 2023-11-21 DIAGNOSIS — R519 Headache, unspecified: Secondary | ICD-10-CM | POA: Insufficient documentation

## 2023-11-21 DIAGNOSIS — N186 End stage renal disease: Secondary | ICD-10-CM | POA: Diagnosis not present

## 2023-11-21 DIAGNOSIS — Z992 Dependence on renal dialysis: Secondary | ICD-10-CM | POA: Insufficient documentation

## 2023-11-21 DIAGNOSIS — Z5329 Procedure and treatment not carried out because of patient's decision for other reasons: Secondary | ICD-10-CM | POA: Insufficient documentation

## 2023-11-21 DIAGNOSIS — I120 Hypertensive chronic kidney disease with stage 5 chronic kidney disease or end stage renal disease: Secondary | ICD-10-CM | POA: Diagnosis not present

## 2023-11-21 LAB — CBC
HCT: 27.5 % — ABNORMAL LOW (ref 39.0–52.0)
Hemoglobin: 9.1 g/dL — ABNORMAL LOW (ref 13.0–17.0)
MCH: 30.8 pg (ref 26.0–34.0)
MCHC: 33.1 g/dL (ref 30.0–36.0)
MCV: 93.2 fL (ref 80.0–100.0)
Platelets: 250 K/uL (ref 150–400)
RBC: 2.95 MIL/uL — ABNORMAL LOW (ref 4.22–5.81)
RDW: 15.4 % (ref 11.5–15.5)
WBC: 7.6 K/uL (ref 4.0–10.5)
nRBC: 0 % (ref 0.0–0.2)

## 2023-11-21 LAB — RENAL FUNCTION PANEL
Albumin: 3.8 g/dL (ref 3.5–5.0)
Anion gap: 23 — ABNORMAL HIGH (ref 5–15)
BUN: 93 mg/dL — ABNORMAL HIGH (ref 6–20)
CO2: 18 mmol/L — ABNORMAL LOW (ref 22–32)
Calcium: 6.9 mg/dL — ABNORMAL LOW (ref 8.9–10.3)
Chloride: 99 mmol/L (ref 98–111)
Creatinine, Ser: 14.24 mg/dL — ABNORMAL HIGH (ref 0.61–1.24)
GFR, Estimated: 4 mL/min — ABNORMAL LOW (ref >60)
Glucose, Bld: 96 mg/dL (ref 70–99)
Phosphorus: >30 mg/dL — ABNORMAL HIGH (ref 2.5–4.6)
Potassium: 4.8 mmol/L (ref 3.5–5.1)
Sodium: 140 mmol/L (ref 135–145)

## 2023-11-21 LAB — HEPATITIS B SURFACE ANTIGEN: Hepatitis B Surface Ag: NONREACTIVE

## 2023-11-21 MED ORDER — ACETAMINOPHEN 325 MG PO TABS
650.0000 mg | ORAL_TABLET | Freq: Once | ORAL | Status: AC
  Administered 2023-11-21: 650 mg via ORAL
  Filled 2023-11-21: qty 2

## 2023-11-21 MED ORDER — CHLORHEXIDINE GLUCONATE CLOTH 2 % EX PADS: 6.0000 | MEDICATED_PAD | Freq: Every day | CUTANEOUS | Status: DC

## 2023-11-21 NOTE — ED Triage Notes (Addendum)
 Patient is here for his routine hemodialysis treatment . No distress, ambulatory/respirations unlabored . Hypertensive .

## 2023-11-21 NOTE — ED Provider Notes (Signed)
 [place] EMERGENCY DEPARTMENT AT [facility] Provider Note   CSN: [id number] Arrival date & time: 11/21/23  9379     Patient presents with: Hemodialysis   Paul [name] is a 58 y.o. male.   58 year old male here for his routine dialysis.  He no longer has an outpatient facility that he can go to.  Usually comes Monday Wednesday Friday.  He is complaining of a mild headache at this time.  Denies any other symptoms or concerns.        Prior to Admission medications   Medication Sig Start Date End Date Taking? Authorizing Provider  ARIPiprazole  (ABILIFY ) 5 MG tablet Take 1 tablet (5 mg total) by mouth at bedtime. 10/28/23   Plovsky, Elna, MD  carvedilol  (COREG ) 6.25 MG tablet Take 1 tablet (6.25 mg total) by mouth 2 (two) times daily with a meal. 07/29/23 10/28/23  Tawkaliyar, Roya, DO  isosorbide  mononitrate (IMDUR ) 30 MG 24 hr tablet Take 1 tablet (30 mg total) by mouth daily. Patient not taking: Reported on 10/28/2023 10/25/23   [name], MD  rosuvastatin  (CRESTOR ) 5 MG tablet Take 1 tablet (5 mg total) by mouth daily. 03/24/23     sucroferric oxyhydroxide (VELPHORO ) 500 MG chewable tablet Chew 3 tablets (1,500 mg total) by mouth 3 (three) times daily with meals. Patient not taking: Reported on 10/28/2023 07/29/23   Tawkaliyar, Roya, DO    Allergies: Penicillin g    Review of Systems  Constitutional:  Negative for chills and fever.  HENT:  Negative for ear pain and sore throat.   Eyes:  Negative for pain and visual disturbance.  Respiratory:  Negative for cough and shortness of breath.   Cardiovascular:  Negative for chest pain and palpitations.  Gastrointestinal:  Negative for abdominal pain and vomiting.  Genitourinary:  Negative for dysuria and hematuria.  Musculoskeletal:  Negative for arthralgias and back pain.  Skin:  Negative for color change and rash.  Neurological:  Positive for headaches. Negative for seizures and syncope.  All other systems  reviewed and are negative.   Updated Vital Signs BP (!) 180/104   Pulse 94   Temp 98.7 F (37.1 C)   Resp 18   SpO2 99%   Physical Exam Vitals and nursing note reviewed.  Constitutional:      General: He is not in acute distress.    Appearance: Normal appearance. He is well-developed. He is not ill-appearing.  HENT:     Head: Normocephalic and atraumatic.  Eyes:     Conjunctiva/sclera: Conjunctivae normal.  Cardiovascular:     Rate and Rhythm: Normal rate and regular rhythm.     Heart sounds: No murmur heard. Pulmonary:     Effort: Pulmonary effort is normal. No respiratory distress.     Breath sounds: Normal breath sounds.  Abdominal:     Palpations: Abdomen is soft.     Tenderness: There is no abdominal tenderness.  Musculoskeletal:        General: No swelling.     Cervical back: Neck supple.  Skin:    General: Skin is warm and dry.     Capillary Refill: Capillary refill takes less than 2 seconds.  Neurological:     Mental Status: He is alert.  Psychiatric:        Mood and Affect: Mood normal.     (all labs ordered are listed, but only abnormal results are displayed) Labs Reviewed  HEPATITIS B SURFACE ANTIGEN  HEPATITIS B SURFACE ANTIBODY,  QUANTITATIVE  RENAL FUNCTION PANEL  CBC    EKG: None  Radiology: No results found.   Procedures   Medications Ordered in the ED  Chlorhexidine  Gluconate Cloth 2 % PADS 6 each (has no administration in time range)  acetaminophen  (TYLENOL ) tablet 650 mg (650 mg Oral Given 11/21/23 9173)                                    Medical Decision Making Patient here for dialysis.  He is here usually Monday Wednesday Friday for routine dialysis.  Complaining of mild headache.  Given Tylenol .  Nephrology consult they will put in orders and plan to do his dialysis.  Problems Addressed: End stage renal disease on dialysis [facility]): chronic illness or injury with exacerbation, progression, or side effects of  treatment Nonintractable headache, unspecified chronicity pattern, unspecified headache type: self-limited or minor problem  Amount and/or Complexity of Data Reviewed External Data Reviewed: notes.    Details: Previous records reviewed patient presents for usually Monday Wednesday Friday for dialysis Labs: ordered. Discussion of management or test interpretation with external provider(s): I spoke with nephrology on-call and they will put orders in for the patient and prepped him for dialysis and he will receive dialysis today.  Risk OTC drugs. Prescription drug management. Drug therapy requiring intensive monitoring for toxicity.    Final diagnoses:  Nonintractable headache, unspecified chronicity pattern, unspecified headache type  End stage renal disease on dialysis [facility])    ED Discharge Orders     None          Gennaro Duwaine CROME, DO 11/21/23 [id number]

## 2023-11-21 NOTE — Plan of Care (Signed)
 Patient here for routine HD.    Note that he received aranesp  100 mcg on 7/15.  Appreciate assistance of pharmacy.    Orders placed for HD today  Katheryn JAYSON Saba, MD 8:48 AM 11/21/2023

## 2023-11-22 LAB — HEPATITIS B SURFACE ANTIBODY, QUANTITATIVE: Hep B S AB Quant (Post): 6544 m[IU]/mL

## 2023-11-24 ENCOUNTER — Encounter (HOSPITAL_COMMUNITY): Payer: Self-pay

## 2023-11-24 ENCOUNTER — Emergency Department (HOSPITAL_COMMUNITY)
Admission: EM | Admit: 2023-11-24 | Discharge: 2023-11-24 | Disposition: A | Discharge status: Home / Self Care | Attending: Emergency Medicine | Admitting: Emergency Medicine

## 2023-11-24 ENCOUNTER — Other Ambulatory Visit: Payer: Self-pay

## 2023-11-24 DIAGNOSIS — N186 End stage renal disease: Secondary | ICD-10-CM | POA: Diagnosis not present

## 2023-11-24 DIAGNOSIS — I120 Hypertensive chronic kidney disease with stage 5 chronic kidney disease or end stage renal disease: Secondary | ICD-10-CM | POA: Diagnosis not present

## 2023-11-24 DIAGNOSIS — Z992 Dependence on renal dialysis: Secondary | ICD-10-CM | POA: Diagnosis not present

## 2023-11-24 LAB — RENAL FUNCTION PANEL
Albumin: 3.6 g/dL (ref 3.5–5.0)
Anion gap: 18 — ABNORMAL HIGH (ref 5–15)
BUN: 84 mg/dL — ABNORMAL HIGH (ref 6–20)
CO2: 18 mmol/L — ABNORMAL LOW (ref 22–32)
Calcium: 7.1 mg/dL — ABNORMAL LOW (ref 8.9–10.3)
Chloride: 101 mmol/L (ref 98–111)
Creatinine, Ser: 13.74 mg/dL — ABNORMAL HIGH (ref 0.61–1.24)
GFR, Estimated: 4 mL/min — ABNORMAL LOW (ref >60)
Glucose, Bld: 92 mg/dL (ref 70–99)
Phosphorus: 10 mg/dL — ABNORMAL HIGH (ref 2.5–4.6)
Potassium: 5 mmol/L (ref 3.5–5.1)
Sodium: 137 mmol/L (ref 135–145)

## 2023-11-24 LAB — CBC
HCT: 24.9 % — ABNORMAL LOW (ref 39.0–52.0)
Hemoglobin: 8.2 g/dL — ABNORMAL LOW (ref 13.0–17.0)
MCH: 31.1 pg (ref 26.0–34.0)
MCHC: 32.9 g/dL (ref 30.0–36.0)
MCV: 94.3 fL (ref 80.0–100.0)
Platelets: 268 K/uL (ref 150–400)
RBC: 2.64 MIL/uL — ABNORMAL LOW (ref 4.22–5.81)
RDW: 15.3 % (ref 11.5–15.5)
WBC: 7.3 K/uL (ref 4.0–10.5)
nRBC: 0 % (ref 0.0–0.2)

## 2023-11-24 MED ORDER — NEPRO/CARBSTEADY PO LIQD: 237.0000 mL | ORAL | Status: DC | PRN

## 2023-11-24 MED ORDER — PENTAFLUOROPROP-TETRAFLUOROETH EX AERO: 1.0000 | INHALATION_SPRAY | CUTANEOUS | Status: DC | PRN

## 2023-11-24 MED ORDER — CHLORHEXIDINE GLUCONATE CLOTH 2 % EX PADS: 6.0000 | MEDICATED_PAD | Freq: Every day | CUTANEOUS | Status: DC

## 2023-11-24 MED ORDER — LIDOCAINE-PRILOCAINE 2.5-2.5 % EX CREA: 1.0000 | TOPICAL_CREAM | CUTANEOUS | Status: DC | PRN

## 2023-11-24 MED ORDER — ANTICOAGULANT SODIUM CITRATE 4% (200MG/5ML) IV SOLN: 5.0000 mL | Status: DC | PRN

## 2023-11-24 MED ORDER — ALTEPLASE 2 MG IJ SOLR: 2.0000 mg | Freq: Once | INTRAMUSCULAR | Status: DC | PRN

## 2023-11-24 MED ORDER — HEPARIN SODIUM (PORCINE) 1000 UNIT/ML DIALYSIS
2400.0000 [IU] | Freq: Once | INTRAMUSCULAR | Status: AC
  Administered 2023-11-24: 2400 [IU] via INTRAVENOUS_CENTRAL
  Filled 2023-11-24: qty 3

## 2023-11-24 MED ORDER — LIDOCAINE HCL (PF) 1 % IJ SOLN: 5.0000 mL | INTRAMUSCULAR | Status: DC | PRN

## 2023-11-24 MED ORDER — HEPARIN SODIUM (PORCINE) 1000 UNIT/ML DIALYSIS: 1000.0000 [IU] | INTRAMUSCULAR | Status: DC | PRN

## 2023-11-24 NOTE — Progress Notes (Addendum)
 Asked to see this patient for hospital dialysis. Pt was discharged from his outpatient unit about 4-6 mos ago for behavior issues. The plan will be for ED HD. Pt is not to be admitted at this time. Pt will go to the dialysis unit when they are ready for the patient. When dialysis is completed pt will be sent back to ED for reassessment.    Last OP HD unit info --> pt released 07/2023 from [facility] 4h  B400  76.5kg  2K bath    RUA AVF  Heparin  2400    Clinical issues other than volume: 1) Anemia of esrd:  - pt dose not have a nephrologist or HD unit so does not have access to ESA or IV iron  other than when here for hospital HD sessions - recent Hb in the 7.9- 10  range - last 3 doses of darbepoeitin here -->  - 100 mcg 10/10/23 - 200 mcg 10/17/23 - 100 mcg 11/18/23   - last tsat 24% and ferritin 1828 on 09/05/23-> ferritin too high to give IV fe  PLAN: --> give esa later this week   Myer Fret  MD  [facility] 11/24/2023, 1:49 PM  Recent Labs  Lab 11/21/23 1102 11/24/23 0928  HGB 9.1* 8.2*  ALBUMIN 3.8 3.6  CALCIUM  6.9* 7.1*  PHOS >30.0* 10.0*  CREATININE 14.24* 13.74*  K 4.8 5.0    Inpatient medications:  Chlorhexidine  Gluconate Cloth  6 each Topical Q0600    anticoagulant sodium citrate      alteplase , anticoagulant sodium citrate , feeding supplement (NEPRO CARB STEADY), heparin , lidocaine  (PF), lidocaine -prilocaine , pentafluoroprop-tetrafluoroeth

## 2023-11-24 NOTE — ED Notes (Signed)
 Went to Dialysis

## 2023-11-24 NOTE — ED Triage Notes (Signed)
 Pt coming in from home pt reports needing dialysis today

## 2023-11-24 NOTE — Procedures (Addendum)
 I was present at the procedure, reviewed the HD regimen and made appropriate changes.   Myer Fret MD  [facility] 11/24/2023, 9:31 AM

## 2023-11-24 NOTE — ED Provider Notes (Signed)
 Paul [name] AT [facility] Provider Note   CSN: [id number] Arrival date & time: 11/24/23  0630     Patient presents with: Vascular Access Problem   Paul [name] is a 58 y.o. male who is presenting today for routine dialysis.  He does not have a outpatient center to follow, and requires 3 times weekly dialysis and is normally on a schedule of Monday, Wednesday, Friday.  He is asymptomatic at present, does not have any complaints, and states he is presenting today for his routine dialysis treatment.   HPI     Prior to Admission medications   Medication Sig Start Date End Date Taking? Authorizing Provider  ARIPiprazole  (ABILIFY ) 5 MG tablet Take 1 tablet (5 mg total) by mouth at bedtime. 10/28/23   [name], Elna, MD  carvedilol  (COREG ) 6.25 MG tablet Take 1 tablet (6.25 mg total) by mouth 2 (two) times daily with a meal. 07/29/23 10/28/23  [name], Roya, DO  isosorbide  mononitrate (IMDUR ) 30 MG 24 hr tablet Take 1 tablet (30 mg total) by mouth daily. Patient not taking: Reported on 10/28/2023 10/25/23   Paul [name] NOVAK, MD  rosuvastatin  (CRESTOR ) 5 MG tablet Take 1 tablet (5 mg total) by mouth daily. 03/24/23     sucroferric oxyhydroxide (VELPHORO ) 500 MG chewable tablet Chew 3 tablets (1,500 mg total) by mouth 3 (three) times daily with meals. Patient not taking: Reported on 10/28/2023 07/29/23   [name], Roya, DO    Allergies: Penicillin g    Review of Systems  Constitutional:  Negative for chills and fever.  HENT:  Negative for sore throat.   Eyes:  Negative for visual disturbance.  Respiratory:  Negative for cough and shortness of breath.   Cardiovascular:  Negative for chest pain.  Gastrointestinal:  Negative for abdominal pain, diarrhea, nausea and vomiting.  Genitourinary:  Negative for dysuria and frequency.  Musculoskeletal:  Negative for back pain and neck pain.  Skin:  Negative for rash.  Neurological:  Negative for weakness,  numbness and headaches.  Hematological:  Negative for adenopathy.  Psychiatric/Behavioral:  Negative for behavioral problems.     Updated Vital Signs BP (!) 140/92   Pulse 84   Temp 97.8 F (36.6 C)   Resp 15   Ht 5' 3 (1.6 m)   Wt 80.2 kg   SpO2 100%   BMI 31.32 kg/m   Physical Exam Vitals and nursing note reviewed.  Constitutional:      General: He is not in acute distress.    Appearance: He is well-developed.  HENT:     Head: Normocephalic and atraumatic.  Eyes:     Conjunctiva/sclera: Conjunctivae normal.  Cardiovascular:     Rate and Rhythm: Normal rate and regular rhythm.     Heart sounds: No murmur heard.    Comments: Patient has AV fistula on the right upper arm, thrill is palpated and bruit is appreciated. Pulmonary:     Effort: Pulmonary effort is normal. No respiratory distress.     Breath sounds: Normal breath sounds.  Abdominal:     Palpations: Abdomen is soft.     Tenderness: There is no abdominal tenderness.  Musculoskeletal:        General: No swelling.     Cervical back: Neck supple.  Skin:    General: Skin is warm and dry.     Capillary Refill: Capillary refill takes less than 2 seconds.  Neurological:     Mental Status: He is alert.  Psychiatric:  Mood and Affect: Mood normal.     (all labs ordered are listed, but only abnormal results are displayed) Labs Reviewed  CBC - Abnormal; Notable for the following components:      Result Value   RBC 2.64 (*)    Hemoglobin 8.2 (*)    HCT 24.9 (*)    All other components within normal limits  RENAL FUNCTION PANEL - Abnormal; Notable for the following components:   CO2 18 (*)    BUN 84 (*)    Creatinine, Ser 13.74 (*)    Calcium  7.1 (*)    Phosphorus 10.0 (*)    GFR, Estimated 4 (*)    Anion gap 18 (*)    All other components within normal limits  RENAL FUNCTION PANEL    EKG: None  Radiology: No results found.   Procedures   Medications Ordered in the ED  Chlorhexidine   Gluconate Cloth 2 % PADS 6 each (has no administration in time range)  pentafluoroprop-tetrafluoroeth (GEBAUERS) aerosol 1 Application (has no administration in time range)  lidocaine  (PF) (XYLOCAINE ) 1 % injection 5 mL (has no administration in time range)  lidocaine -prilocaine  (EMLA ) cream 1 Application (has no administration in time range)  feeding supplement (NEPRO CARB STEADY) liquid 237 mL (has no administration in time range)  heparin  injection 1,000 Units (has no administration in time range)  anticoagulant sodium citrate  solution 5 mL (has no administration in time range)  alteplase  (CATHFLO ACTIVASE ) injection 2 mg (has no administration in time range)  heparin  injection 2,400 Units (2,400 Units Dialysis Given 11/24/23 9071)                                    Medical Decision Making  Medical Decision Making:   Paul [name] is a 58 y.o. male who presented to the ED today with request for routine dialysis detailed above.     Complete initial physical exam performed, notably the patient  was alert and oriented in no apparent distress.  He is asymptomatic and has no complaints at this time, presenting for routine dialysis.  AV fistula is present on the right upper arm with palpable thrill and bruit auscultated..    Reviewed and confirmed nursing documentation for past medical history, family history, social history.    Initial Assessment:   Patient presents today as asymptomatic and requesting routine dialysis.  Initial Plan:  Plan to consult with nephrology for dialysis. Patient presentation and present history, labs and imaging deferred at this time.    Consults: Case discussed with nephrology who will take patient for his routine dialysis.   Reassessment and Plan:   Patient taken by nephrology for his routine dialysis.  After his dialysis he was brought back to the emergency [name], did not stay for reassessment and left before reevaluation.  As such continue to plan  to discharge as he is uncomplicated routine dialysis.       Final diagnoses:  ESRD (end stage renal disease) Paul [name])  Dialysis patient Paul [name])    ED Discharge Orders     None          Paul [name], [place] 11/24/23 1401    Paul Lamar BROCKS, MD 11/29/23 1354

## 2023-11-24 NOTE — Progress Notes (Signed)
 Received patient in bed to unit.  Alert and oriented.  Informed consent signed and in chart.   TX duration:3  Patient tolerated well.  Transported back to the room  Alert, without acute distress.  Hand-off given to patient's nurse.   Access used: AVF Access issues: n/a  Total UF removed: 2.5L Medication(s) given: n/a Post HD weight: 80.2KG   11/24/23 1306  Vitals  Temp 97.8 F (36.6 C)  BP (!) 162/98  MAP (mmHg) 118  Pulse Rate 83  ECG Heart Rate 83  Weight 80.2 kg  Type of Weight Post-Dialysis  Oxygen Therapy  SpO2 100 %  O2 Device Room Air  During Treatment Monitoring  Blood Flow Rate (mL/min) 400 mL/min  Arterial Pressure (mmHg) -124.84 mmHg  Venous Pressure (mmHg) 293.12 mmHg  TMP (mmHg) 21.82 mmHg  Ultrafiltration Rate (mL/min) 879 mL/min  Dialysate Flow Rate (mL/min) 299 ml/min  Duration of HD Treatment -hour(s) 3.41 hour(s)  Cumulative Fluid Removed (mL) per Treatment  2418.43  HD Safety Checks Performed Yes  Intra-Hemodialysis Comments Tx completed  Post Treatment  Dialyzer Clearance Clear  Liters Processed 84  Fluid Removed (mL) 2500 mL  Tolerated HD Treatment Yes  AVG/AVF Arterial Site Held (minutes) 5 minutes  AVG/AVF Venous Site Held (minutes) 5 minutes  Fistula / Graft Right Upper arm Arteriovenous fistula  No placement date or time found.   Placed prior to admission: Yes  Orientation: Right  Access Location: Upper arm  Access Type: Arteriovenous fistula  Site Condition No complications  Fistula / Graft Assessment Present;Thrill;Bruit  Status Deaccessed      Ollen LITTIE Bunker [facility]

## 2023-11-26 ENCOUNTER — Emergency Department (HOSPITAL_COMMUNITY)
Admission: EM | Admit: 2023-11-26 | Discharge: 2023-11-26 | Discharge status: Against Medical Advice | Attending: Emergency Medicine | Admitting: Emergency Medicine

## 2023-11-26 ENCOUNTER — Other Ambulatory Visit: Payer: Self-pay

## 2023-11-26 ENCOUNTER — Encounter (HOSPITAL_COMMUNITY): Payer: Self-pay

## 2023-11-26 DIAGNOSIS — N186 End stage renal disease: Secondary | ICD-10-CM | POA: Diagnosis not present

## 2023-11-26 DIAGNOSIS — Z5329 Procedure and treatment not carried out because of patient's decision for other reasons: Secondary | ICD-10-CM | POA: Insufficient documentation

## 2023-11-26 DIAGNOSIS — I120 Hypertensive chronic kidney disease with stage 5 chronic kidney disease or end stage renal disease: Secondary | ICD-10-CM | POA: Diagnosis not present

## 2023-11-26 DIAGNOSIS — Z992 Dependence on renal dialysis: Secondary | ICD-10-CM | POA: Diagnosis not present

## 2023-11-26 LAB — CBC
HCT: 28.7 % — ABNORMAL LOW (ref 39.0–52.0)
Hemoglobin: 9.3 g/dL — ABNORMAL LOW (ref 13.0–17.0)
MCH: 31.5 pg (ref 26.0–34.0)
MCHC: 32.4 g/dL (ref 30.0–36.0)
MCV: 97.3 fL (ref 80.0–100.0)
Platelets: 294 K/uL (ref 150–400)
RBC: 2.95 MIL/uL — ABNORMAL LOW (ref 4.22–5.81)
RDW: 15.9 % — ABNORMAL HIGH (ref 11.5–15.5)
WBC: 7.5 K/uL (ref 4.0–10.5)
nRBC: 0 % (ref 0.0–0.2)

## 2023-11-26 LAB — BASIC METABOLIC PANEL WITH GFR
Anion gap: 17 — ABNORMAL HIGH (ref 5–15)
BUN: 54 mg/dL — ABNORMAL HIGH (ref 6–20)
CO2: 22 mmol/L (ref 22–32)
Calcium: 7.5 mg/dL — ABNORMAL LOW (ref 8.9–10.3)
Chloride: 96 mmol/L — ABNORMAL LOW (ref 98–111)
Creatinine, Ser: 10.38 mg/dL — ABNORMAL HIGH (ref 0.61–1.24)
GFR, Estimated: 5 mL/min — ABNORMAL LOW (ref >60)
Glucose, Bld: 85 mg/dL (ref 70–99)
Potassium: 5.3 mmol/L — ABNORMAL HIGH (ref 3.5–5.1)
Sodium: 135 mmol/L (ref 135–145)

## 2023-11-26 MED ORDER — HEPARIN SODIUM (PORCINE) 1000 UNIT/ML IJ SOLN
INTRAMUSCULAR | Status: AC
  Filled 2023-11-26: qty 3

## 2023-11-26 MED ORDER — CHLORHEXIDINE GLUCONATE CLOTH 2 % EX PADS: 6.0000 | MEDICATED_PAD | Freq: Every day | CUTANEOUS | Status: DC

## 2023-11-26 MED ORDER — HEPARIN SODIUM (PORCINE) 1000 UNIT/ML DIALYSIS
2400.0000 [IU] | Freq: Once | INTRAMUSCULAR | Status: AC
  Administered 2023-11-26: 2400 [IU] via INTRAVENOUS_CENTRAL

## 2023-11-26 MED ORDER — ISOSORBIDE MONONITRATE ER 30 MG PO TB24: 30.0000 mg | ORAL_TABLET | Freq: Every day | ORAL | Status: DC

## 2023-11-26 NOTE — ED Provider Notes (Signed)
 [place] EMERGENCY DEPARTMENT AT [facility] Provider Note   CSN: [id number] Arrival date & time: 11/26/23  9373     Patient presents with: No chief complaint on file.   Paul [name] is a 58 y.o. male.   Patient here for dialysis.  Gets her dialysis on Monday Wednesday and Friday here.  Has no symptoms.  No chest pain or shortness of breath.  Had dialysis session Monday.  Compliant with meds.  The history is provided by the patient.       Prior to Admission medications   Medication Sig Start Date End Date Taking? Authorizing Provider  ARIPiprazole  (ABILIFY ) 5 MG tablet Take 1 tablet (5 mg total) by mouth at bedtime. 10/28/23   Plovsky, Elna, MD  carvedilol  (COREG ) 6.25 MG tablet Take 1 tablet (6.25 mg total) by mouth 2 (two) times daily with a meal. 07/29/23 10/28/23  Tawkaliyar, Roya, DO  isosorbide  mononitrate (IMDUR ) 30 MG 24 hr tablet Take 1 tablet (30 mg total) by mouth daily. Patient not taking: Reported on 10/28/2023 10/25/23   [name], MD  rosuvastatin  (CRESTOR ) 5 MG tablet Take 1 tablet (5 mg total) by mouth daily. 03/24/23     sucroferric oxyhydroxide (VELPHORO ) 500 MG chewable tablet Chew 3 tablets (1,500 mg total) by mouth 3 (three) times daily with meals. Patient not taking: Reported on 10/28/2023 07/29/23   Tawkaliyar, Roya, DO    Allergies: Penicillin g    Review of Systems  Updated Vital Signs BP (!) 182/89 (BP Location: Right Arm)   Pulse 82   Temp 97.9 F (36.6 C) (Oral)   Resp 20   Ht 5' 3 (1.6 m)   Wt 79.4 kg   SpO2 100%   BMI 31.00 kg/m   Physical Exam Constitutional:      General: He is not in acute distress.    Appearance: He is not ill-appearing.  HENT:     Nose: Nose normal.  Cardiovascular:     Pulses: Normal pulses.  Pulmonary:     Effort: Pulmonary effort is normal.  Skin:    Capillary Refill: Capillary refill takes less than 2 seconds.  Neurological:     General: No focal deficit present.     Mental  Status: He is alert.     (all labs ordered are listed, but only abnormal results are displayed) Labs Reviewed  CBC - Abnormal; Notable for the following components:      Result Value   RBC 2.95 (*)    Hemoglobin 9.3 (*)    HCT 28.7 (*)    RDW 15.9 (*)    All other components within normal limits  BASIC METABOLIC PANEL WITH GFR - Abnormal; Notable for the following components:   Potassium 5.3 (*)    Chloride 96 (*)    BUN 54 (*)    Creatinine, Ser 10.38 (*)    Calcium  7.5 (*)    GFR, Estimated 5 (*)    Anion gap 17 (*)    All other components within normal limits    EKG: None  Radiology: No results found.   Procedures   Medications Ordered in the ED  Chlorhexidine  Gluconate Cloth 2 % PADS 6 each (has no administration in time range)                                    Medical Decision Making Amount and/or Complexity of Data  Reviewed Labs: ordered.   Paul [name] is here for his routine dialysis session.  He receives dialysis through the ED 3 times a week.  He has no symptoms.  Blood pressure is mildly elevated.  I have made nephrology team aware.  Blood work was already done in triage which is unremarkable.  Patient to go to dialysis and anticipate discharge afterwards.  This chart was dictated using voice recognition software.  Despite best efforts to proofread,  errors can occur which can change the documentation meaning.      Final diagnoses:  ESRD (end stage renal disease) [facility])    ED Discharge Orders     None          Ruthe Cornet, DO 11/26/23 [id number]

## 2023-11-26 NOTE — ED Triage Notes (Signed)
 M, W, F. Seeking dialysis.   Denies cp / shob or other associated sx.

## 2023-11-26 NOTE — Progress Notes (Signed)
   11/26/23 1254  Vitals  Temp 98.1 F (36.7 C)  Pulse Rate 96  Resp 12  BP (!) 136/92  SpO2 100 %  O2 Device Room Air  Oxygen Therapy  Patient Activity (if Appropriate) In bed  Pulse Oximetry Type Continuous  Oximetry Probe Site Changed No  Post Treatment  Dialyzer Clearance Lightly streaked  Liters Processed 78  Fluid Removed (mL) 2400 mL  Tolerated HD Treatment Yes  AVG/AVF Arterial Site Held (minutes) 5 minutes  AVG/AVF Venous Site Held (minutes) 5 minutes   Received patient in bed to unit.  Alert and oriented.  Informed consent signed and in chart.   TX duration:3.25  Patient tolerated well.  Transported back to the room  Alert, without acute distress.  Hand-off given to patient's nurse.   Access used: Yes Access issues: No  Total UF removed: 2400 Medication(s) given: See MAR Post HD VS: See Above Grid Post HD weight: 79.7 kg   Zebedee DELE[facility]

## 2023-11-26 NOTE — ED Notes (Signed)
 Patient got up and left after dialysis.

## 2023-11-28 ENCOUNTER — Other Ambulatory Visit: Payer: Self-pay

## 2023-11-28 ENCOUNTER — Emergency Department (HOSPITAL_COMMUNITY)
Admission: EM | Admit: 2023-11-28 | Discharge: 2023-11-28 | Discharge status: Against Medical Advice | Attending: Emergency Medicine | Admitting: Emergency Medicine

## 2023-11-28 DIAGNOSIS — N186 End stage renal disease: Secondary | ICD-10-CM | POA: Insufficient documentation

## 2023-11-28 DIAGNOSIS — Z992 Dependence on renal dialysis: Secondary | ICD-10-CM | POA: Diagnosis not present

## 2023-11-28 DIAGNOSIS — I120 Hypertensive chronic kidney disease with stage 5 chronic kidney disease or end stage renal disease: Secondary | ICD-10-CM | POA: Diagnosis not present

## 2023-11-28 LAB — CBC WITH DIFFERENTIAL/PLATELET
Abs Immature Granulocytes: 0.09 K/uL — ABNORMAL HIGH (ref 0.00–0.07)
Basophils Absolute: 0 K/uL (ref 0.0–0.1)
Basophils Relative: 0 %
Eosinophils Absolute: 0.1 K/uL (ref 0.0–0.5)
Eosinophils Relative: 2 %
HCT: 25.9 % — ABNORMAL LOW (ref 39.0–52.0)
Hemoglobin: 8.5 g/dL — ABNORMAL LOW (ref 13.0–17.0)
Immature Granulocytes: 2 %
Lymphocytes Relative: 14 %
Lymphs Abs: 0.8 K/uL (ref 0.7–4.0)
MCH: 31.7 pg (ref 26.0–34.0)
MCHC: 32.8 g/dL (ref 30.0–36.0)
MCV: 96.6 fL (ref 80.0–100.0)
Monocytes Absolute: 0.6 K/uL (ref 0.1–1.0)
Monocytes Relative: 10 %
Neutro Abs: 4.3 K/uL (ref 1.7–7.7)
Neutrophils Relative %: 72 %
Platelets: 185 K/uL (ref 150–400)
RBC: 2.68 MIL/uL — ABNORMAL LOW (ref 4.22–5.81)
RDW: 15.9 % — ABNORMAL HIGH (ref 11.5–15.5)
WBC: 5.9 K/uL (ref 4.0–10.5)
nRBC: 0 % (ref 0.0–0.2)

## 2023-11-28 MED ORDER — CHLORHEXIDINE GLUCONATE CLOTH 2 % EX PADS: 6.0000 | MEDICATED_PAD | Freq: Every day | CUTANEOUS | Status: DC

## 2023-11-28 NOTE — Progress Notes (Signed)
 Met with pt at bedside while receiving HD. Pt state he continues to take his prescribed medications and plans to see his mental health provider for next appt beginning of August. Advised pt that new [facility] clinic to notify navigator when new clinic is able to accept new pts and navigator will make referral at that time. Pt voices understanding. Will continue to follow.   Randine Mungo Dialysis Navigator [phone]

## 2023-11-28 NOTE — Progress Notes (Addendum)
 Received patient in bed to unit.  Alert and oriented.  Informed consent signed and in chart.   TX duration:3:25  Patient tolerated well.  Transported back to the room  Alert, without acute distress.  Hand-off given to patient's nurse.   Access used: Yes Access issues: No   Total UF removed: 2500 Medication(s) given: No Medication Post HD VS: See Below Grid Post HD weight: 79.1 kg   [facility]  11/28/23 1232  Vitals  Pulse Rate 91  Resp 20  BP (!) 142/95  SpO2 100 %  O2 Device Room Air  Weight (S)  79.1 kg (Standing Scale)  Type of Weight Post-Dialysis  Oxygen Therapy  Patient Activity (if Appropriate) In bed  Pulse Oximetry Type Continuous  Oximetry Probe Site Changed No  During Treatment Monitoring  Blood Flow Rate (mL/min) 400 mL/min  Arterial Pressure (mmHg) -146.46 mmHg  Venous Pressure (mmHg) 376.75 mmHg  TMP (mmHg) 15.75 mmHg  Ultrafiltration Rate (mL/min) 940 mL/min  Dialysate Flow Rate (mL/min) 299 ml/min  Dialysate Potassium Concentration 2  Dialysate Calcium  Concentration 2.5  Duration of HD Treatment -hour(s) 3.22 hour(s)  Cumulative Fluid Removed (mL) per Treatment  2470.34  HD Safety Checks Performed Yes  Intra-Hemodialysis Comments Tolerated well;Tx completed  Dialysis Fluid Bolus Normal Saline

## 2023-11-28 NOTE — ED Notes (Signed)
 Patient was transported down from dialysis and was told to wait however he walked out.

## 2023-11-28 NOTE — ED Provider Notes (Signed)
  [place] EMERGENCY DEPARTMENT AT [facility] Provider Note   CSN: [id number] Arrival date & time: 11/28/23  9373     Patient presents with: Hemodialysis  HPI Paul [name] is a 58 y.o. male with ESRD here for dialysis.  Last dialysis was 2 days ago.  Has no other complaints at this time.   HPI     Prior to Admission medications   Medication Sig Start Date End Date Taking? Authorizing Provider  ARIPiprazole  (ABILIFY ) 5 MG tablet Take 1 tablet (5 mg total) by mouth at bedtime. 10/28/23   Plovsky, Elna, MD  carvedilol  (COREG ) 6.25 MG tablet Take 1 tablet (6.25 mg total) by mouth 2 (two) times daily with a meal. 07/29/23 10/28/23  Tawkaliyar, Roya, DO  isosorbide  mononitrate (IMDUR ) 30 MG 24 hr tablet Take 1 tablet (30 mg total) by mouth daily. Patient not taking: Reported on 10/28/2023 10/25/23   [name], MD  rosuvastatin  (CRESTOR ) 5 MG tablet Take 1 tablet (5 mg total) by mouth daily. 03/24/23     sucroferric oxyhydroxide (VELPHORO ) 500 MG chewable tablet Chew 3 tablets (1,500 mg total) by mouth 3 (three) times daily with meals. Patient not taking: Reported on 10/28/2023 07/29/23   Heddy Barren, DO    Allergies: Penicillin g    Review of Systems See HPI   Physical Exam   Vitals:   11/28/23 0631  BP: (!) 161/95  Pulse: 92  Resp: 16  Temp: 97.8 F (36.6 C)  SpO2: 99%    CONSTITUTIONAL:  well-appearing, NAD NEURO:  Alert and oriented x 3, CN 3-12 grossly intact EYES:  eyes equal and reactive ENT/NECK:  Supple, no stridor  CARDIO:  Regular rate and rhythm, appears well-perfused  PULM:  No respiratory distress, CTAB GI/GU:  non-distended, soft, non tender MSK/SPINE:  No gross deformities, no edema, moves all extremities  SKIN:  no rash, atraumatic  *Additional and/or pertinent findings included in MDM below  (all labs ordered are listed, but only abnormal results are displayed) Labs Reviewed  CBC WITH DIFFERENTIAL/PLATELET     EKG: None  Radiology: No results found.   Procedures   Medications Ordered in the ED  Chlorhexidine  Gluconate Cloth 2 % PADS 6 each (0 each Topical Hold 11/28/23 0756)                                    Medical Decision Making  58 year old well-appearing male well-known to the department presenting for dialysis.  He looks well, exam was unremarkable.  Reached out to Dr. Geralynn of nephrology who agreed to list him today for dialysis.  He has been transferred to dialysis unit.  Anticipate discharge upon his return to the ED after his treatment.     Final diagnoses:  ESRD (end stage renal disease) [facility])    ED Discharge Orders     None          Lang Norleen POUR, PA-C 11/28/23 0849    Trine Raynell Moder, MD 12/05/23 1705

## 2023-11-28 NOTE — Procedures (Signed)
 Asked to see this patient for hospital dialysis. Pt was discharged from his outpatient unit several months ago for behavior issues. The plan will be for ED HD. Pt is not to be admitted at this time. Pt will go to the dialysis unit when they are ready for the patient. When dialysis is completed pt will be sent back to ED for reassessment.   I was present at the procedure, reviewed the HD regimen and made appropriate changes.   Myer Fret MD  [facility] 11/28/2023, 9:18 AM

## 2023-11-28 NOTE — ED Triage Notes (Signed)
 Patient is here for his routine hemodialysis treatment . No distress,respirations unlabored / ambulatory .

## 2023-12-01 ENCOUNTER — Other Ambulatory Visit: Payer: Self-pay | Admitting: Internal Medicine

## 2023-12-01 ENCOUNTER — Other Ambulatory Visit (HOSPITAL_COMMUNITY): Payer: Self-pay

## 2023-12-01 ENCOUNTER — Encounter (HOSPITAL_COMMUNITY): Payer: Self-pay | Admitting: Emergency Medicine

## 2023-12-01 ENCOUNTER — Telehealth: Payer: Self-pay | Admitting: Internal Medicine

## 2023-12-01 ENCOUNTER — Other Ambulatory Visit: Payer: Self-pay

## 2023-12-01 ENCOUNTER — Emergency Department (HOSPITAL_COMMUNITY)
Admission: EM | Admit: 2023-12-01 | Discharge: 2023-12-01 | Disposition: A | Discharge status: Home / Self Care | Attending: Emergency Medicine | Admitting: Emergency Medicine

## 2023-12-01 DIAGNOSIS — Z992 Dependence on renal dialysis: Secondary | ICD-10-CM | POA: Insufficient documentation

## 2023-12-01 DIAGNOSIS — N186 End stage renal disease: Secondary | ICD-10-CM | POA: Diagnosis not present

## 2023-12-01 DIAGNOSIS — I120 Hypertensive chronic kidney disease with stage 5 chronic kidney disease or end stage renal disease: Secondary | ICD-10-CM | POA: Diagnosis not present

## 2023-12-01 LAB — CBC WITH DIFFERENTIAL/PLATELET
Abs Immature Granulocytes: 0.03 K/uL (ref 0.00–0.07)
Basophils Absolute: 0 K/uL (ref 0.0–0.1)
Basophils Relative: 1 %
Eosinophils Absolute: 0.2 K/uL (ref 0.0–0.5)
Eosinophils Relative: 3 %
HCT: 26.1 % — ABNORMAL LOW (ref 39.0–52.0)
Hemoglobin: 8.7 g/dL — ABNORMAL LOW (ref 13.0–17.0)
Immature Granulocytes: 1 %
Lymphocytes Relative: 20 %
Lymphs Abs: 1.2 K/uL (ref 0.7–4.0)
MCH: 31.1 pg (ref 26.0–34.0)
MCHC: 33.3 g/dL (ref 30.0–36.0)
MCV: 93.2 fL (ref 80.0–100.0)
Monocytes Absolute: 0.6 K/uL (ref 0.1–1.0)
Monocytes Relative: 10 %
Neutro Abs: 4.1 K/uL (ref 1.7–7.7)
Neutrophils Relative %: 65 %
Platelets: 193 K/uL (ref 150–400)
RBC: 2.8 MIL/uL — ABNORMAL LOW (ref 4.22–5.81)
RDW: 15.9 % — ABNORMAL HIGH (ref 11.5–15.5)
WBC: 6.2 K/uL (ref 4.0–10.5)
nRBC: 0 % (ref 0.0–0.2)

## 2023-12-01 LAB — HEPATITIS B SURFACE ANTIGEN: Hepatitis B Surface Ag: NONREACTIVE

## 2023-12-01 LAB — RENAL FUNCTION PANEL
Albumin: 3.6 g/dL (ref 3.5–5.0)
Anion gap: 18 — ABNORMAL HIGH (ref 5–15)
BUN: 98 mg/dL — ABNORMAL HIGH (ref 6–20)
CO2: 20 mmol/L — ABNORMAL LOW (ref 22–32)
Calcium: 6.9 mg/dL — ABNORMAL LOW (ref 8.9–10.3)
Chloride: 101 mmol/L (ref 98–111)
Creatinine, Ser: 13.18 mg/dL — ABNORMAL HIGH (ref 0.61–1.24)
GFR, Estimated: 4 mL/min — ABNORMAL LOW (ref >60)
Glucose, Bld: 94 mg/dL (ref 70–99)
Phosphorus: >30 mg/dL — ABNORMAL HIGH (ref 2.5–4.6)
Potassium: 5.5 mmol/L — ABNORMAL HIGH (ref 3.5–5.1)
Sodium: 139 mmol/L (ref 135–145)

## 2023-12-01 MED ORDER — CHLORHEXIDINE GLUCONATE CLOTH 2 % EX PADS: 6.0000 | MEDICATED_PAD | Freq: Every day | CUTANEOUS | Status: DC

## 2023-12-01 MED ORDER — HEPARIN SODIUM (PORCINE) 1000 UNIT/ML IJ SOLN
INTRAMUSCULAR | Status: AC
  Filled 2023-12-01: qty 4

## 2023-12-01 NOTE — Telephone Encounter (Signed)
 Called pt to confirm appt for 7/29 LVM

## 2023-12-01 NOTE — Progress Notes (Signed)
   12/01/23 1611  Vitals  Temp (!) 97.4 F (36.3 C)  BP (!) 154/90  Pulse Rate 70  Resp 15  Type of Weight Post-Dialysis  Oxygen Therapy  SpO2 100 %  O2 Device Room Air  Patient Activity (if Appropriate) In bed  Pulse Oximetry Type Continuous  Oximetry Probe Site Changed No  Post Treatment  Dialyzer Clearance Clear  Liters Processed 53.2  Fluid Removed (mL) 1700 mL  Tolerated HD Treatment Yes  Post-Hemodialysis Comments Patient signed off with 54 minutes remaining. Patient received education regarding early termination. Patient acknowledged understanding  AVG/AVF Arterial Site Held (minutes) 7 minutes  AVG/AVF Venous Site Held (minutes) 5 minutes

## 2023-12-01 NOTE — ED Triage Notes (Signed)
 Pt reports needing to get dialysis.

## 2023-12-01 NOTE — ED Provider Notes (Signed)
 [place] EMERGENCY DEPARTMENT AT [facility] Provider Note   CSN: [id number] Arrival date & time: 12/01/23  9346     Patient presents with: Needs Dialysis   Paul [name] is a 58 y.o. male.   58 year old male with history of ESRD presents requesting dialysis.  Patient gets his dialysis through the ED.  Has no new complaints at this time.  Was last dialyzed 2 days ago       Prior to Admission medications   Medication Sig Start Date End Date Taking? Authorizing Provider  ARIPiprazole  (ABILIFY ) 5 MG tablet Take 1 tablet (5 mg total) by mouth at bedtime. 10/28/23   Plovsky, Elna, MD  carvedilol  (COREG ) 6.25 MG tablet Take 1 tablet (6.25 mg total) by mouth 2 (two) times daily with a meal. 07/29/23 10/28/23  Tawkaliyar, Roya, DO  isosorbide  mononitrate (IMDUR ) 30 MG 24 hr tablet Take 1 tablet (30 mg total) by mouth daily. Patient not taking: Reported on 10/28/2023 10/25/23   [name], MD  rosuvastatin  (CRESTOR ) 5 MG tablet Take 1 tablet (5 mg total) by mouth daily. 03/24/23     sucroferric oxyhydroxide (VELPHORO ) 500 MG chewable tablet Chew 3 tablets (1,500 mg total) by mouth 3 (three) times daily with meals. Patient not taking: Reported on 10/28/2023 07/29/23   Tawkaliyar, Roya, DO    Allergies: Penicillin g    Review of Systems  All other systems reviewed and are negative.   Updated Vital Signs BP (!) 150/88 (BP Location: Left Arm)   Pulse 81   Temp 97.6 F (36.4 C)   Resp 17   Ht 1.6 m (5' 3)   Wt 79.1 kg   SpO2 97%   BMI 30.89 kg/m   Physical Exam Vitals and nursing note reviewed.  Constitutional:      General: He is not in acute distress.    Appearance: Normal appearance. He is well-developed. He is not toxic-appearing.  HENT:     Head: Normocephalic and atraumatic.  Eyes:     General: Lids are normal.     Conjunctiva/sclera: Conjunctivae normal.     Pupils: Pupils are equal, round, and reactive to light.  Neck:     Thyroid: No thyroid  mass.     Trachea: No tracheal deviation.  Cardiovascular:     Rate and Rhythm: Normal rate and regular rhythm.     Heart sounds: Normal heart sounds. No murmur heard.    No gallop.  Pulmonary:     Effort: Pulmonary effort is normal. No respiratory distress.     Breath sounds: Normal breath sounds. No stridor. No decreased breath sounds, wheezing, rhonchi or rales.  Abdominal:     General: There is no distension.     Palpations: Abdomen is soft.     Tenderness: There is no abdominal tenderness. There is no rebound.  Musculoskeletal:        General: No tenderness. Normal range of motion.     Cervical back: Normal range of motion and neck supple.  Skin:    General: Skin is warm and dry.     Findings: No abrasion or rash.  Neurological:     Mental Status: He is alert and oriented to person, place, and time. Mental status is at baseline.     GCS: GCS eye subscore is 4. GCS verbal subscore is 5. GCS motor subscore is 6.     Cranial Nerves: No cranial nerve deficit.     Sensory: No sensory deficit.  Motor: Motor function is intact.  Psychiatric:        Attention and Perception: Attention normal.        Speech: Speech normal.        Behavior: Behavior normal.     (all labs ordered are listed, but only abnormal results are displayed) Labs Reviewed - No data to display  EKG: None  Radiology: No results found.   Procedures   Medications Ordered in the ED - No data to display                                  Medical Decision Making  Discussed with nephrology who will call for patient to be dialyzed.     Final diagnoses:  None    ED Discharge Orders     None          Dasie Faden, MD 12/01/23 [phone]

## 2023-12-01 NOTE — ED Notes (Signed)
 Unsucessful attempt to get blood. Phlebotomy will collect.

## 2023-12-02 ENCOUNTER — Other Ambulatory Visit: Payer: Self-pay | Admitting: Internal Medicine

## 2023-12-02 ENCOUNTER — Ambulatory Visit: Admitting: Internal Medicine

## 2023-12-02 LAB — HEPATITIS B SURFACE ANTIBODY, QUANTITATIVE: Hep B S AB Quant (Post): 2170 m[IU]/mL

## 2023-12-02 NOTE — Telephone Encounter (Unsigned)
 Copied from CRM #[id number]. Topic: Clinical - Medication Refill >> Dec 02, 2023  9:58 AM Carlatta H wrote: Medication: carvedilol  (COREG ) 6.25 MG tablet  Has the patient contacted their pharmacy? Yes (Agent: If no, request that the patient contact the pharmacy for the refill. If patient does not wish to contact the pharmacy document the reason why and proceed with request.) (Agent: If yes, when and what did the pharmacy advise?)Sent a request to office  This is the patient's preferred pharmacy:  [facility] [address] [place] [address] Phone: [phone] Fax: [phone]  Is this the correct pharmacy for this prescription? Yes If no, delete pharmacy and type the correct one.   Has the prescription been filled recently? No  Is the patient out of the medication? No  Has the patient been seen for an appointment in the last year OR does the patient have an upcoming appointment? Yes  Can we respond through MyChart? No  Agent: Please be advised that Rx refills may take up to 3 business days. We ask that you follow-up with your pharmacy.

## 2023-12-03 ENCOUNTER — Other Ambulatory Visit: Payer: Self-pay

## 2023-12-03 ENCOUNTER — Ambulatory Visit (HOSPITAL_COMMUNITY)
Admission: EM | Admit: 2023-12-03 | Discharge: 2023-12-03 | Disposition: A | Discharge status: Home / Self Care | Attending: Internal Medicine | Admitting: Internal Medicine

## 2023-12-03 DIAGNOSIS — D631 Anemia in chronic kidney disease: Secondary | ICD-10-CM | POA: Insufficient documentation

## 2023-12-03 DIAGNOSIS — Z992 Dependence on renal dialysis: Secondary | ICD-10-CM | POA: Insufficient documentation

## 2023-12-03 DIAGNOSIS — N186 End stage renal disease: Secondary | ICD-10-CM | POA: Diagnosis not present

## 2023-12-03 MED ORDER — CHLORHEXIDINE GLUCONATE CLOTH 2 % EX PADS: 6.0000 | MEDICATED_PAD | Freq: Every day | CUTANEOUS | Status: DC

## 2023-12-03 NOTE — Telephone Encounter (Signed)
 Requested medication (s) are due for refill today: yes  Requested medication (s) are on the active medication list: yes  Last refill:  07/29/23  Future visit scheduled: yes  Notes to clinic:  Unable to refill per protocol, last refill by another provider.      Requested Prescriptions  Pending Prescriptions Disp Refills   carvedilol  (COREG ) 6.25 MG tablet 60 tablet 0    Sig: Take 1 tablet (6.25 mg total) by mouth 2 (two) times daily with a meal.     Cardiovascular: Beta Blockers 3 Failed - 12/03/2023 11:57 AM      Failed - Cr in normal range and within 360 days    Creatinine, Ser  Date Value Ref Range Status  12/01/2023 13.18 (H) 0.61 - 1.24 mg/dL Final   Creatinine, Urine  Date Value Ref Range Status  05/12/2022 30 mg/dL Final    Comment:    Performed at [facility], [address], [place], [place] [address]         Failed - AST in normal range and within 360 days    AST  Date Value Ref Range Status  10/10/2023 13 (L) 15 - 41 U/L Final         Passed - ALT in normal range and within 360 days    ALT  Date Value Ref Range Status  10/10/2023 15 0 - 44 U/L Final         Passed - Last BP in normal range    BP Readings from Last 1 Encounters:  12/03/23 129/89         Passed - Last Heart Rate in normal range    Pulse Readings from Last 1 Encounters:  12/03/23 76         Passed - Valid encounter within last 6 months    Recent Outpatient Visits           2 months ago Schizoaffective disorder, bipolar type (HCC)   [facility] [place]. [facility] [name], MD   4 months ago Hospital discharge follow-up   [facility] [place]. [facility] [name], MD   1 year ago ESRD on hemodialysis [facility])   [facility] [place]. [facility] [name], MD   1 year ago Hospital discharge follow-up   [facility] [place]. [facility] [name], MD   2 years ago Encounter to establish care   [facility] [place] [facility] [place]. [facility] [name], MD

## 2023-12-03 NOTE — Progress Notes (Signed)
   12/03/23 1715  Vitals  Temp 97.8 F (36.6 C)  Pulse Rate 75  Resp 16  BP (!) 140/76  SpO2 100 %  O2 Device Room Air  Weight (S)  80 kg (Standind Scale)  Type of Weight Post-Dialysis  Oxygen Therapy  Patient Activity (if Appropriate) In bed  Pulse Oximetry Type Continuous  Post Treatment  Dialyzer Clearance Clear  Hemodialysis Intake (mL) 0 mL  Liters Processed 70.6  Fluid Removed (mL) 2500 mL  Tolerated HD Treatment Yes  Post-Hemodialysis Comments Tx. Complete without difficulties and pt voice no complaints. UF goal obtained and Report call to ED  AVG/AVF Arterial Site Held (minutes) 5 minutes  AVG/AVF Venous Site Held (minutes) 5 minutes   Received patient in bed to unit.  Alert and oriented.  Informed consent signed and in chart.   TX duration: 3.25  Patient tolerated well.  Transported back to the room  Alert, without acute distress.  Hand-off given to patient's nurse.   Access used: Yes Access issues: No  Total UF removed: 2500 Medication(s) given: No Meds Post HD VS: See Above Grid Post HD weight: 80 kg   [facility]

## 2023-12-03 NOTE — ED Triage Notes (Signed)
 Pt. Here for dialysis , Completed full treatment on Monday. Denies any other problems

## 2023-12-03 NOTE — Progress Notes (Signed)
 Paul [name] presents to hospital for ED dialysis. Patient was discharged from his outpatient HD center 4-6 months ago 2nd behavioral issues. Patient is not to be admitted at this time. Plan for patient to go to the dialysis unit when ready. Once treatment is completed, patient will return to the ED for re-assessment.  Last OP HD unit info --> pt released 07/2023 from [facility] 4h  B400  76.5kg  2K bath    RUA AVF  Heparin  2400    Clinical issues other than volume: 1) Anemia of esrd:  - pt dose not have a nephrologist or HD unit so does not have access to ESA or IV iron  other than when here for hospital HD sessions - recent Hb in the 7.9- 10  range - last 3 doses of darbepoeitin here -->  - 100 mcg 10/10/23 - 200 mcg 10/17/23 - 100 mcg 11/18/23    - last tsat 24% and ferritin 1828 on 09/05/23-> ferritin too high to give IV fe   PLAN: --> checking RFP an CBC pre-HD   Charmaine Piety, NP [facility]

## 2023-12-04 ENCOUNTER — Telehealth: Payer: Self-pay | Admitting: Internal Medicine

## 2023-12-04 NOTE — Telephone Encounter (Signed)
 Noted

## 2023-12-04 NOTE — Telephone Encounter (Signed)
 Copied from CRM [id number]. Topic: General - Other >> Dec 04, 2023  2:14 PM Carlatta H wrote:  Reason for CRM: Patient received a call today with not message left//Please call back if needed//

## 2023-12-05 ENCOUNTER — Emergency Department (HOSPITAL_COMMUNITY): Admission: EM | Admit: 2023-12-05 | Discharge: 2023-12-06 | Disposition: A | Discharge status: Home / Self Care

## 2023-12-05 ENCOUNTER — Other Ambulatory Visit: Payer: Self-pay

## 2023-12-05 ENCOUNTER — Encounter (HOSPITAL_COMMUNITY): Payer: Self-pay

## 2023-12-05 DIAGNOSIS — I509 Heart failure, unspecified: Secondary | ICD-10-CM | POA: Diagnosis not present

## 2023-12-05 DIAGNOSIS — Z79899 Other long term (current) drug therapy: Secondary | ICD-10-CM | POA: Diagnosis not present

## 2023-12-05 DIAGNOSIS — N186 End stage renal disease: Secondary | ICD-10-CM | POA: Diagnosis not present

## 2023-12-05 DIAGNOSIS — Z992 Dependence on renal dialysis: Secondary | ICD-10-CM | POA: Insufficient documentation

## 2023-12-05 DIAGNOSIS — I132 Hypertensive heart and chronic kidney disease with heart failure and with stage 5 chronic kidney disease, or end stage renal disease: Secondary | ICD-10-CM | POA: Insufficient documentation

## 2023-12-05 DIAGNOSIS — I120 Hypertensive chronic kidney disease with stage 5 chronic kidney disease or end stage renal disease: Secondary | ICD-10-CM | POA: Diagnosis not present

## 2023-12-05 LAB — RENAL FUNCTION PANEL
Albumin: 3.3 g/dL — ABNORMAL LOW (ref 3.5–5.0)
Anion gap: 18 — ABNORMAL HIGH (ref 5–15)
BUN: 89 mg/dL — ABNORMAL HIGH (ref 6–20)
CO2: 22 mmol/L (ref 22–32)
Calcium: 6.8 mg/dL — ABNORMAL LOW (ref 8.9–10.3)
Chloride: 95 mmol/L — ABNORMAL LOW (ref 98–111)
Creatinine, Ser: 12.02 mg/dL — ABNORMAL HIGH (ref 0.61–1.24)
GFR, Estimated: 4 mL/min — ABNORMAL LOW (ref >60)
Glucose, Bld: 103 mg/dL — ABNORMAL HIGH (ref 70–99)
Phosphorus: 11.1 mg/dL — ABNORMAL HIGH (ref 2.5–4.6)
Potassium: 4.5 mmol/L (ref 3.5–5.1)
Sodium: 135 mmol/L (ref 135–145)

## 2023-12-05 LAB — CBC
HCT: 24.6 % — ABNORMAL LOW (ref 39.0–52.0)
Hemoglobin: 8.3 g/dL — ABNORMAL LOW (ref 13.0–17.0)
MCH: 31 pg (ref 26.0–34.0)
MCHC: 33.7 g/dL (ref 30.0–36.0)
MCV: 91.8 fL (ref 80.0–100.0)
Platelets: 218 K/uL (ref 150–400)
RBC: 2.68 MIL/uL — ABNORMAL LOW (ref 4.22–5.81)
RDW: 15.5 % (ref 11.5–15.5)
WBC: 5.9 K/uL (ref 4.0–10.5)
nRBC: 0 % (ref 0.0–0.2)

## 2023-12-05 MED ORDER — PENTAFLUOROPROP-TETRAFLUOROETH EX AERO: 1.0000 | INHALATION_SPRAY | CUTANEOUS | Status: DC | PRN

## 2023-12-05 MED ORDER — HEPARIN SODIUM (PORCINE) 1000 UNIT/ML IJ SOLN
INTRAMUSCULAR | Status: AC
Start: 2023-12-05 — End: 2023-12-05
  Filled 2023-12-05: qty 2

## 2023-12-05 MED ORDER — LIDOCAINE-PRILOCAINE 2.5-2.5 % EX CREA: 1.0000 | TOPICAL_CREAM | CUTANEOUS | Status: DC | PRN

## 2023-12-05 MED ORDER — CHLORHEXIDINE GLUCONATE CLOTH 2 % EX PADS: 6.0000 | MEDICATED_PAD | Freq: Every day | CUTANEOUS | Status: DC

## 2023-12-05 MED ORDER — LIDOCAINE HCL (PF) 1 % IJ SOLN: 5.0000 mL | INTRAMUSCULAR | Status: DC | PRN

## 2023-12-05 MED ORDER — HEPARIN SODIUM (PORCINE) 1000 UNIT/ML DIALYSIS: 1000.0000 [IU] | INTRAMUSCULAR | Status: DC | PRN

## 2023-12-05 MED ORDER — HEPARIN SODIUM (PORCINE) 1000 UNIT/ML DIALYSIS
2000.0000 [IU] | Freq: Once | INTRAMUSCULAR | Status: AC
  Administered 2023-12-05: 2000 [IU] via INTRAVENOUS_CENTRAL
  Filled 2023-12-05: qty 2

## 2023-12-05 NOTE — Progress Notes (Signed)
   12/05/23 1643  Vitals  Temp 98.5 F (36.9 C)  Pulse Rate 79  Resp 12  BP 123/77  SpO2 100 %  O2 Device Room Air  Oxygen Therapy  Patient Activity (if Appropriate) In bed  Pulse Oximetry Type Continuous  Oximetry Probe Site Changed No  Post Treatment  Dialyzer Clearance Clear  Liters Processed 67.5  Fluid Removed (mL) 2500 mL  Tolerated HD Treatment Yes  AVG/AVF Arterial Site Held (minutes) 10 minutes  AVG/AVF Venous Site Held (minutes) 10 minutes     Received patient in bed to unit.  Alert and oriented.  Informed consent signed and in chart.    TX duration:3 hrs   Patient tolerated well.  Transported back to the room  Alert, without acute distress.  Hand-off given to patient's nurse.    Access used: AVF Access issues: none   Total UF removed: 2.5L Medication(s) given:  see eMar  Woodfin Dolores RN [facility]

## 2023-12-05 NOTE — ED Triage Notes (Signed)
 Pt came in POV requesting dialysis, last received his Tx on Wednesday. A/Ox4, denies any pain or SOB.

## 2023-12-05 NOTE — ED Provider Notes (Signed)
 [place] EMERGENCY DEPARTMENT AT [facility] Provider Note   CSN: [id number] Arrival date & time: 12/05/23  9296     Patient presents with: Request Dialysis   Paul [name] is a 58 y.o. male.   58 year old male with a history of hypertension, obesity, CRAO, CHF, ESRD on HD, schizophrenia and bipolar disorder presents to the emergency department requesting dialysis.  Reports that he was discharged from his dialysis center due to being a perceived threat to staff. Last dialyzed on Wednesday. Denies any complaints. No CP, SOB, lightheadedness, syncope, V/D.  The history is provided by the patient. No language interpreter was used.       Prior to Admission medications   Medication Sig Start Date End Date Taking? Authorizing Provider  ARIPiprazole  (ABILIFY ) 5 MG tablet Take 1 tablet (5 mg total) by mouth at bedtime. 10/28/23   Plovsky, Elna, MD  carvedilol  (COREG ) 6.25 MG tablet Take 1 tablet (6.25 mg total) by mouth 2 (two) times daily with a meal. 07/29/23 10/28/23  Tawkaliyar, Roya, DO  isosorbide  mononitrate (IMDUR ) 30 MG 24 hr tablet Take 1 tablet (30 mg total) by mouth daily. Patient not taking: Reported on 10/28/2023 10/25/23   [name], MD  rosuvastatin  (CRESTOR ) 5 MG tablet Take 1 tablet (5 mg total) by mouth daily. 03/24/23     sucroferric oxyhydroxide (VELPHORO ) 500 MG chewable tablet Chew 3 tablets (1,500 mg total) by mouth 3 (three) times daily with meals. Patient not taking: Reported on 10/28/2023 07/29/23   Tawkaliyar, Roya, DO    Allergies: Penicillin g    Review of Systems Ten systems reviewed and are negative for acute change, except as noted in the HPI.    Updated Vital Signs BP (!) 143/82   Pulse 61   Temp 97.9 F (36.6 C)   Resp 15   Ht 5' 3 (1.6 m)   Wt 82.9 kg   SpO2 100%   BMI 32.37 kg/m   Physical Exam Vitals and nursing note reviewed.  Constitutional:      General: He is not in acute distress.    Appearance: He is  well-developed. He is not diaphoretic.     Comments: Nontoxic appearing. Calm and cooperative.  HENT:     Head: Normocephalic and atraumatic.  Eyes:     General: No scleral icterus.    Conjunctiva/sclera: Conjunctivae normal.  Cardiovascular:     Rate and Rhythm: Normal rate and regular rhythm.     Pulses: Normal pulses.  Pulmonary:     Effort: Pulmonary effort is normal. No respiratory distress.     Breath sounds: No wheezing.     Comments: Respirations even and unlabored Musculoskeletal:        General: Normal range of motion.     Cervical back: Normal range of motion.  Skin:    General: Skin is warm and dry.     Coloration: Skin is not pale.     Findings: No erythema or rash.  Neurological:     Mental Status: He is alert and oriented to person, place, and time.     Coordination: Coordination normal.  Psychiatric:        Behavior: Behavior normal.     (all labs ordered are listed, but only abnormal results are displayed) Labs Reviewed  RENAL FUNCTION PANEL  CBC    EKG: None  Radiology: No results found.   Procedures   Medications Ordered in the ED  Chlorhexidine  Gluconate Cloth 2 % PADS 6 each (  has no administration in time range)  pentafluoroprop-tetrafluoroeth (GEBAUERS) aerosol 1 Application (has no administration in time range)  lidocaine  (PF) (XYLOCAINE ) 1 % injection 5 mL (has no administration in time range)  lidocaine -prilocaine  (EMLA ) cream 1 Application (has no administration in time range)  heparin  injection 1,000 Units (has no administration in time range)  heparin  injection 2,000 Units (has no administration in time range)    Clinical Course as of 12/05/23 1333  Fri Dec 05, 2023  0750 Dr. Macel of Nephrology aware of patient. Plan to initiate orders for inpatient dialysis. [KH]  1113 Eating sandwich in ED, NAD noted. [KH]  1330 Transferred to HD floor [KH]    Clinical Course User Index [KH] Keith Sor, PA-C                                  Medical Decision Making  This patient presents to the ED for concern of needing dialysis, this involves an extensive number of treatment options, and is a complaint that carries with it a high risk of complications and morbidity.  The differential diagnosis includes pulmonary edema vs electrolyte derangement vs sepsis.   Co morbidities that complicate the patient evaluation  HTN ESRD Schizophrenia   Additional history obtained:  External records from outside source obtained and reviewed including renal function panel from 12/01/23   Cardiac Monitoring:  The patient was maintained on a cardiac monitor.  I personally viewed and interpreted the cardiac monitored which showed an underlying rhythm of: NSR   Medicines ordered and prescription drug management:  I have reviewed the patients home medicines and have made adjustments as needed   Test Considered:  BMP   Consultations Obtained:  I requested consultation with Dr. Macel of Nephrology - they will coordinate in hospital dialysis for patient.   Problem List / ED Course:  As above   Reevaluation:  After the interventions noted above, I reevaluated the patient and found that they have :stayed the same   Social Determinants of Health:  Lives independently   Dispostion:  Patient transferred to floor for HD.       Final diagnoses:  ESRD needing dialysis [facility])    ED Discharge Orders     None          Keith Sor, PA-C 12/05/23 1333    Simon Lavonia SAILOR, MD 12/05/23 1434

## 2023-12-05 NOTE — ED Notes (Signed)
 Pt being transported to dialysis by transport specialist.

## 2023-12-08 ENCOUNTER — Other Ambulatory Visit: Payer: Self-pay

## 2023-12-08 ENCOUNTER — Emergency Department (HOSPITAL_COMMUNITY): Admission: EM | Admit: 2023-12-08 | Discharge: 2023-12-08 | Disposition: A | Discharge status: Home / Self Care

## 2023-12-08 ENCOUNTER — Emergency Department (HOSPITAL_COMMUNITY)

## 2023-12-08 ENCOUNTER — Encounter (HOSPITAL_COMMUNITY): Payer: Self-pay

## 2023-12-08 DIAGNOSIS — Z992 Dependence on renal dialysis: Secondary | ICD-10-CM | POA: Diagnosis not present

## 2023-12-08 DIAGNOSIS — E875 Hyperkalemia: Secondary | ICD-10-CM | POA: Diagnosis not present

## 2023-12-08 DIAGNOSIS — R030 Elevated blood-pressure reading, without diagnosis of hypertension: Secondary | ICD-10-CM | POA: Diagnosis not present

## 2023-12-08 DIAGNOSIS — R0602 Shortness of breath: Secondary | ICD-10-CM | POA: Diagnosis not present

## 2023-12-08 DIAGNOSIS — E8770 Fluid overload, unspecified: Secondary | ICD-10-CM | POA: Insufficient documentation

## 2023-12-08 DIAGNOSIS — I517 Cardiomegaly: Secondary | ICD-10-CM | POA: Diagnosis not present

## 2023-12-08 LAB — RENAL FUNCTION PANEL
Albumin: 3.4 g/dL — ABNORMAL LOW (ref 3.5–5.0)
Anion gap: 20 — ABNORMAL HIGH (ref 5–15)
BUN: 121 mg/dL — ABNORMAL HIGH (ref 6–20)
CO2: 17 mmol/L — ABNORMAL LOW (ref 22–32)
Calcium: 7 mg/dL — ABNORMAL LOW (ref 8.9–10.3)
Chloride: 104 mmol/L (ref 98–111)
Creatinine, Ser: 12.79 mg/dL — ABNORMAL HIGH (ref 0.61–1.24)
GFR, Estimated: 4 mL/min — ABNORMAL LOW (ref >60)
Glucose, Bld: 92 mg/dL (ref 70–99)
Phosphorus: >30 mg/dL — ABNORMAL HIGH (ref 2.5–4.6)
Potassium: 5.6 mmol/L — ABNORMAL HIGH (ref 3.5–5.1)
Sodium: 141 mmol/L (ref 135–145)

## 2023-12-08 LAB — CBC
HCT: 24.3 % — ABNORMAL LOW (ref 39.0–52.0)
Hemoglobin: 7.9 g/dL — ABNORMAL LOW (ref 13.0–17.0)
MCH: 30.5 pg (ref 26.0–34.0)
MCHC: 32.5 g/dL (ref 30.0–36.0)
MCV: 93.8 fL (ref 80.0–100.0)
Platelets: 234 K/uL (ref 150–400)
RBC: 2.59 MIL/uL — ABNORMAL LOW (ref 4.22–5.81)
RDW: 15.4 % (ref 11.5–15.5)
WBC: 6.5 K/uL (ref 4.0–10.5)
nRBC: 0 % (ref 0.0–0.2)

## 2023-12-08 MED ORDER — ANTICOAGULANT SODIUM CITRATE 4% (200MG/5ML) IV SOLN
5.0000 mL | Status: DC | PRN
  Filled 2023-12-08: qty 5

## 2023-12-08 MED ORDER — HEPARIN SODIUM (PORCINE) 1000 UNIT/ML DIALYSIS: 1000.0000 [IU] | INTRAMUSCULAR | Status: DC | PRN

## 2023-12-08 MED ORDER — CHLORHEXIDINE GLUCONATE CLOTH 2 % EX PADS: 6.0000 | MEDICATED_PAD | Freq: Every day | CUTANEOUS | Status: DC

## 2023-12-08 MED ORDER — PENTAFLUOROPROP-TETRAFLUOROETH EX AERO: 1.0000 | INHALATION_SPRAY | CUTANEOUS | Status: DC | PRN

## 2023-12-08 MED ORDER — LIDOCAINE-PRILOCAINE 2.5-2.5 % EX CREA: 1.0000 | TOPICAL_CREAM | CUTANEOUS | Status: DC | PRN

## 2023-12-08 MED ORDER — LIDOCAINE HCL (PF) 1 % IJ SOLN: 5.0000 mL | INTRAMUSCULAR | Status: DC | PRN

## 2023-12-08 MED ORDER — ALTEPLASE 2 MG IJ SOLR: 2.0000 mg | Freq: Once | INTRAMUSCULAR | Status: DC | PRN

## 2023-12-08 MED ORDER — NEPRO/CARBSTEADY PO LIQD
237.0000 mL | ORAL | Status: DC | PRN
  Filled 2023-12-08: qty 237

## 2023-12-08 MED ORDER — HEPARIN SODIUM (PORCINE) 1000 UNIT/ML DIALYSIS: 2400.0000 [IU] | Freq: Once | INTRAMUSCULAR | Status: DC

## 2023-12-08 NOTE — ED Provider Notes (Signed)
 [place] EMERGENCY DEPARTMENT AT [facility] Provider Note   CSN: [id number] Arrival date & time: 12/08/23  9373     Patient presents with: needs dialysis   Paul [name] is a 58 y.o. male.   HPI     Presents because of need for dialysis.  Last dialysis session was on Friday.  Endorses maybe some slight shortness of breath because he feels like he drink too much water .  No chest pain.  Exertional chest pain.  No clear chest pain.  No fever no chills.  No hemoptysis.  Otherwise denies all complaints.  Previous medical history reviewed : Patient was last seen in the ED on August 1.  Was seen because of need for dialysis.   Prior to Admission medications   Medication Sig Start Date End Date Taking? Authorizing Provider  ARIPiprazole  (ABILIFY ) 5 MG tablet Take 1 tablet (5 mg total) by mouth at bedtime. 10/28/23   Plovsky, Elna, MD  carvedilol  (COREG ) 6.25 MG tablet Take 1 tablet (6.25 mg total) by mouth 2 (two) times daily with a meal. 07/29/23 10/28/23  Tawkaliyar, Roya, DO  isosorbide  mononitrate (IMDUR ) 30 MG 24 hr tablet Take 1 tablet (30 mg total) by mouth daily. Patient not taking: Reported on 10/28/2023 10/25/23   [name], MD  rosuvastatin  (CRESTOR ) 5 MG tablet Take 1 tablet (5 mg total) by mouth daily. 03/24/23     sucroferric oxyhydroxide (VELPHORO ) 500 MG chewable tablet Chew 3 tablets (1,500 mg total) by mouth 3 (three) times daily with meals. Patient not taking: Reported on 10/28/2023 07/29/23   Tawkaliyar, Roya, DO    Allergies: Penicillin g    Review of Systems  Constitutional:  Negative for chills and fever.  HENT:  Negative for ear pain and sore throat.   Eyes:  Negative for pain and visual disturbance.  Respiratory:  Negative for cough and shortness of breath.   Cardiovascular:  Negative for chest pain and palpitations.  Gastrointestinal:  Negative for abdominal pain and vomiting.  Genitourinary:  Negative for dysuria and hematuria.   Musculoskeletal:  Negative for arthralgias and back pain.  Skin:  Negative for color change and rash.  Neurological:  Negative for seizures and syncope.  All other systems reviewed and are negative.   Updated Vital Signs BP 133/77   Pulse 80   Temp 97.6 F (36.4 C) (Oral)   Resp 18   Ht 5' 3 (1.6 m)   Wt 81.5 kg   SpO2 99%   BMI 31.83 kg/m   Physical Exam Vitals and nursing note reviewed.  Constitutional:      General: He is not in acute distress.    Appearance: He is well-developed.  HENT:     Head: Normocephalic and atraumatic.  Eyes:     Conjunctiva/sclera: Conjunctivae normal.  Cardiovascular:     Rate and Rhythm: Normal rate and regular rhythm.     Heart sounds: No murmur heard. Pulmonary:     Effort: Pulmonary effort is normal. No respiratory distress.     Breath sounds: Normal breath sounds.  Abdominal:     Palpations: Abdomen is soft.     Tenderness: There is no abdominal tenderness.  Musculoskeletal:        General: No swelling.     Cervical back: Neck supple.  Skin:    General: Skin is warm and dry.     Capillary Refill: Capillary refill takes less than 2 seconds.  Neurological:     Mental Status: He  is alert.  Psychiatric:        Mood and Affect: Mood normal.     (all labs ordered are listed, but only abnormal results are displayed) Labs Reviewed  CBC - Abnormal; Notable for the following components:      Result Value   RBC 2.59 (*)    Hemoglobin 7.9 (*)    HCT 24.3 (*)    All other components within normal limits  RENAL FUNCTION PANEL - Abnormal; Notable for the following components:   Potassium 5.6 (*)    CO2 17 (*)    BUN 121 (*)    Creatinine, Ser 12.79 (*)    Calcium  7.0 (*)    Phosphorus >30.0 (*)    Albumin 3.4 (*)    GFR, Estimated 4 (*)    Anion gap 20 (*)    All other components within normal limits    EKG: EKG Interpretation Date/Time:  Monday December 08 2023 06:41:23 EDT Ventricular Rate:  84 PR Interval:  194 QRS  Duration:  96 QT Interval:  420 QTC Calculation: 496 R Axis:   -33  Text Interpretation: Normal sinus rhythm Left axis deviation Prolonged QT Abnormal ECG When compared with ECG of 04-Nov-2023 06:00, PREVIOUS ECG IS PRESENT Confirmed by Simon Rea [phone]) on 12/08/2023 8:37:25 AM  Radiology: [facility] Chest 1 View Result Date: 12/08/2023 CLINICAL DATA:  Shortness of breath EXAM: CHEST  1 VIEW COMPARISON:  11/04/2023 FINDINGS: Midline trachea. Mild cardiomegaly. No pleural effusion or pneumothorax. No congestive failure. Clear lungs. Apical lordotic positioning IMPRESSION: Cardiomegaly without congestive failure. Electronically Signed   By: Rockey Kilts M.D.   On: 12/08/2023 09:16     Procedures   Medications Ordered in the ED  Chlorhexidine  Gluconate Cloth 2 % PADS 6 each (has no administration in time range)                                    Medical Decision Making Amount and/or Complexity of Data Reviewed Radiology: ordered.    Presents because of need for dialysis.  Last dialysis session was on Friday.  Endorses maybe some slight shortness of breath because he feels like he drink too much water .  No chest pain.  Exertional chest pain.  No clear chest pain.  No fever no chills.  No hemoptysis.  Otherwise denies all complaints.  Previous medical history reviewed : Patient was last seen in the ED on August 1.  Was seen because of need for dialysis.   On exam, patient no acute distress.  Hematoma stable.  ANO x 3 GCS 15.  Slightly hypertensive but 99% on room air.  No acute tachypnea or tachycardia.   No rales rhonchi or wheezing. '  Endorses  maybe some minimal shortness of breath.  Feels like he drink too much fluid.  Likely the cause.  No concern for pneumonia.  No concern for any DVT or PE.  EKG shows no acute changes compared to prior. No stemi.    Labs obtained.  Potassium 5.6.  Creatinine elevated.  BUN elevated.  Patient had dialysis after this.  Tolerated session well.   Anion gap likely because of BUN.  Chest x-ray unremarkable.   Patient received dialysis.  Subsequently eloped from the hospital before I can see the patient again.     Final diagnoses:  Hypervolemia, unspecified hypervolemia type  Hyperkalemia    ED Discharge Orders  None          Simon Lavonia SAILOR, MD 12/08/23 1415

## 2023-12-08 NOTE — ED Notes (Signed)
 Transported to dialysis.

## 2023-12-08 NOTE — ED Notes (Signed)
 Patient transported to X-ray

## 2023-12-08 NOTE — Progress Notes (Addendum)
 Received patient in bed to unit.  Alert and oriented.  Informed consent signed and in chart.   TX duration:3.5   Patient tolerated well.  Transported back to the room  Alert, without acute distress.  Hand-off given to patient's nurse.   Access used: AVF Access issues: NA  Total UF removed: 2L Medication(s) given: NA Post HD weight: 81.5KG   12/08/23 1304  Vitals  Temp 97.6 F (36.4 C)  Temp Source Oral  BP 131/84  MAP (mmHg) 98  Pulse Rate 79  ECG Heart Rate 85  Resp 14  Weight 81.5 kg  Type of Weight Post-Dialysis  Oxygen Therapy  SpO2 97 %  O2 Device Room Air  During Treatment Monitoring  Blood Flow Rate (mL/min) 399 mL/min  Arterial Pressure (mmHg) -242.41 mmHg  Venous Pressure (mmHg) 350.49 mmHg  TMP (mmHg) 28.89 mmHg  Ultrafiltration Rate (mL/min) 736 mL/min  Dialysate Flow Rate (mL/min) 300 ml/min  Duration of HD Treatment -hour(s) 3.48 hour(s)  Cumulative Fluid Removed (mL) per Treatment  1988.46  HD Safety Checks Performed Yes  Intra-Hemodialysis Comments Tx completed  Fistula / Graft Right Upper arm Arteriovenous fistula  No placement date or time found.   Placed prior to admission: Yes  Orientation: Right  Access Location: Upper arm  Access Type: Arteriovenous fistula  Site Condition No complications  Fistula / Graft Assessment Present;Thrill;Bruit  Status Deaccessed  Drainage Description None    [name]

## 2023-12-08 NOTE — ED Triage Notes (Signed)
 Pt requesting dialysis, last dialysis 3 days ago. Pt states he is feeling more short of breath than usual.

## 2023-12-09 ENCOUNTER — Ambulatory Visit (HOSPITAL_BASED_OUTPATIENT_CLINIC_OR_DEPARTMENT_OTHER): Admitting: Psychiatry

## 2023-12-09 VITALS — BP 160/94 | HR 70 | Ht 63.0 in | Wt 180.4 lb

## 2023-12-09 DIAGNOSIS — F22 Delusional disorders: Secondary | ICD-10-CM

## 2023-12-09 NOTE — Progress Notes (Signed)
 Psychiatric Initial Adult Assessment   Patient Identification: Paul [name] MRN:  [id number] Date of Evaluation:  12/09/2023 Referral Source: Barnie Louder Chief Complaint: I cannot think straight No chief complaint on file.  Visit Diagnosis: Schizoaffective disorder  History of Present Illness:    Today the patient is doing fairly well.  He takes his Abilify  as prescribed.  He is not drinking any alcohol or using any drugs.  The patient lives in a sober house.  He has little contact with others.  He is still somewhat hyperreligious.  He is not interested in hurting anyone.  Generally he is sleeping actually very well.  He says the Abilify  is very helpful in this regard.  He is eating well.  He functions independently.  He is now back to getting dialysis. Depression Symptoms:  depressed mood, (Hypo) Manic Symptoms:   Anxiety Symptoms:   Psychotic Symptoms:   PTSD Symptoms: Negative Past Psychiatric History: 3 psychiatric hospitalizations  Previous Psychotropic Medications: Yes   Substance Abuse History in the last 12 months:  Yes.    Consequences of Substance Abuse: NA  Past Medical History:  Past Medical History:  Diagnosis Date   Bipolar disorder (HCC)    Central retinal artery occlusion    with macular infarction of the right eye. status post posterior vitrectomy and photocoagulation with insertion of a shunt in 2006.   CHF (congestive heart failure) (HCC)    chronic mixed syst/diast. 02/2009 echo: Systolic function was mildly reduced. The estimated ejection fraction was in 45%, global HK, Grade I Diast dysfxn.   Chronic kidney disease    ESRD   Heart failure    Obesity    Osteoarthritis    s/p right hip replacement in 2001   Schizophrenia (HCC)    Severe uncontrolled hypertension     Past Surgical History:  Procedure Laterality Date   A/V SHUNT INTERVENTION N/A 10/08/2023   Procedure: A/V SHUNT INTERVENTION;  Surgeon: Sheree Penne Bruckner, MD;  Location:  [facility];  Service: Cardiovascular;  Laterality: N/A;   AV FISTULA PLACEMENT Right 07/08/2022   Procedure: RIGHT RADIOCEPHALIC ARTERIOVENOUS (AV) FISTULA CREATION;  Surgeon: Lanis Fonda BRAVO, MD;  [facility];  Service: Vascular;  Laterality: Right;   IR FLUORO GUIDE CV LINE RIGHT  05/13/2022   IR US  GUIDE VASC ACCESS RIGHT  05/13/2022   JOINT REPLACEMENT     right hip replacement   right eye surgery     for glaucoma   TOTAL HIP ARTHROPLASTY Bilateral 1997, 2001   UPPER EXTREMITY INTERVENTION  10/08/2023   Procedure: UPPER EXTREMITY INTERVENTION;  Surgeon: Sheree Penne Bruckner, MD;  Location: [facility];  Service: Cardiovascular;;  Stent   VENOUS ANGIOPLASTY  10/08/2023   Procedure: VENOUS ANGIOPLASTY;  Surgeon: Sheree Penne Bruckner, MD;  Location: [facility];  Service: Cardiovascular;;    Family Psychiatric History:   Family History:  Family History  Problem Relation Age of Onset   Hypertension Father    Stroke Father     Social History:   Social History   Socioeconomic History   Marital status: Legally Separated    Spouse name: Not on file   Number of children: Not on file   Years of education: Not on file   Highest education level: Not on file  Occupational History   Occupation: Chartered certified accountant  Tobacco Use   Smoking status: Former   Smokeless tobacco: Never   Tobacco comments:    Quit smoking 20-30 years ago, smoked for  1 year.  Vaping Use   Vaping status: Never Used  Substance and Sexual Activity   Alcohol use: No   Drug use: No   Sexual activity: Yes    Birth control/protection: None  Other Topics Concern   Not on file  Social History Narrative   Not on file   Social Drivers of Health   Financial Resource Strain: High Risk (09/15/2023)   Overall Financial Resource Strain (CARDIA)    Difficulty of Paying Living Expenses: Very hard  Food Insecurity: No Food Insecurity (09/15/2023)   Hunger Vital Sign    Worried About Running Out of Food in the Last Year:  Never true    Ran Out of Food in the Last Year: Never true  Transportation Needs: Unmet Transportation Needs (09/15/2023)   PRAPARE - Transportation    Lack of Transportation (Medical): Yes    Lack of Transportation (Non-Medical): Yes  Physical Activity: Not on file  Stress: No Stress Concern Present (09/15/2023)   [employer] of Occupational Health - Occupational Stress Questionnaire    Feeling of Stress : Only a little  Social Connections: Moderately Integrated (07/01/2023)   Social Connection and Isolation Panel    Frequency of Communication with Friends and Family: Three times a week    Frequency of Social Gatherings with Friends and Family: Never    Attends Religious Services: More than 4 times per year    Active Member of [employer] or Organizations: Yes    Attends Engineer, structural: More than 4 times per year    Marital Status: Divorced    Additional Social History:   Allergies:   Allergies  Allergen Reactions   Penicillin G Nausea And Vomiting    Metabolic Disorder Labs: Lab Results  Component Value Date   HGBA1C 5.5 06/04/2021   MPG 111.15 06/04/2021   No results found for: PROLACTIN Lab Results  Component Value Date   CHOL 163 06/05/2021   TRIG 230 (H) 06/05/2021   HDL 37 (L) 06/05/2021   CHOLHDL 4.4 06/05/2021   VLDL 46 (H) 06/05/2021   LDLCALC 80 06/05/2021   LDLCALC  12/16/2008    86        Total Cholesterol/HDL:CHD Risk Coronary Heart Disease Risk Table                     Men   Women  1/2 Average Risk   3.4   3.3  Average Risk       5.0   4.4  2 X Average Risk   9.6   7.1  3 X Average Risk  23.4   11.0        Use the calculated Patient Ratio above and the CHD Risk Table to determine the patient's CHD Risk.        ATP III CLASSIFICATION (LDL):  <100     mg/dL   Optimal  899-870  mg/dL   Near or Above                    Optimal  130-159  mg/dL   Borderline  839-810  mg/dL   High  >809     mg/dL   Very High     Therapeutic  Level Labs: No results found for: LITHIUM No results found for: CBMZ No results found for: VALPROATE  Current Medications: Current Outpatient Medications  Medication Sig Dispense Refill   ARIPiprazole  (ABILIFY ) 5 MG tablet Take 1 tablet (5 mg total) by mouth  at bedtime. 30 tablet 3   carvedilol  (COREG ) 6.25 MG tablet Take 1 tablet (6.25 mg total) by mouth 2 (two) times daily with a meal. 60 tablet 0   isosorbide  mononitrate (IMDUR ) 30 MG 24 hr tablet Take 1 tablet (30 mg total) by mouth daily. (Patient not taking: Reported on 10/28/2023) 30 tablet 6   rosuvastatin  (CRESTOR ) 5 MG tablet Take 1 tablet (5 mg total) by mouth daily. 90 tablet 3   sucroferric oxyhydroxide (VELPHORO ) 500 MG chewable tablet Chew 3 tablets (1,500 mg total) by mouth 3 (three) times daily with meals. (Patient not taking: Reported on 10/28/2023) 90 tablet 0   No current facility-administered medications for this visit.    Musculoskeletal: Strength & Muscle Tone: abnormal Gait & Station: ataxic Patient leans: Right  Psychiatric Specialty Exam: Review of Systems  Blood pressure (!) 160/94, pulse 70, height 5' 3 (1.6 m), weight 180 lb 6.4 oz (81.8 kg), SpO2 100%.Body mass index is 31.96 kg/m.  General Appearance: Casual  Eye Contact:  Fair  Speech:  Clear and Coherent  Volume:  Normal  Mood:  NA  Affect:  Appropriate  Thought Process:  Coherent  Orientation:  Full (Time, Place, and Person)  Thought Content:  Delusions  Suicidal Thoughts:  No  Homicidal Thoughts:  No  Memory:  Negative  Judgement:  Poor  Insight:  Lacking  Psychomotor Activity:  Normal  Concentration:    Recall:  Fair  Fund of Knowledge:Fair  Language: Good  Akathisia:  No  Handed:  Right  AIMS (if indicated  Assets:  Desire for Improvement  ADL's:  Intact  Cognition: WNL  Sleep:  Good   Screenings: CAGE-AID    Flowsheet Row ED to Hosp-Admission (Discharged) from 07/24/2023 in [place] [facility]  CAGE-AID  Score 0   GAD-7    Flowsheet Row Office Visit from 09/15/2023 in [facility] Dept Of [place]. [facility]  Total GAD-7 Score 1   PHQ2-9    Flowsheet Row Office Visit from 09/15/2023 in [facility] [place] - A Dept Of [place]. [facility] Office Visit from 06/07/2021 in [facility] - A Dept Of [place]. [facility] Office Visit from 11/03/2019 in [facility] - A Dept Of [place]. [facility]  PHQ-2 Total Score 0 0 0  PHQ-9 Total Score -- -- 3   Flowsheet Row ED from 12/08/2023 in [facility] at [facility] ED to Hosp-Admission (Discharged) from 12/05/2023 in [facility] ED to Hosp-Admission (Discharged) from 12/03/2023 in [facility]  C-SSRS RISK CATEGORY No Risk No Risk No Risk    Assessment and Plan:    Today the patient seems to be stable.  We will continue developing a patient Dr. Relationship.  We will continue taking Abilify  10 mg every night.  He is not abusing any substances.  He has a very isolated lifestyle.  Overall though he functions reasonably well.  Collaboration of Care:   Patient/Guardian was advised Release of Information must be obtained prior to any record release in order to collaborate their care with an outside provider. Patient/Guardian was advised if they have not already done so to contact the registration department to sign all necessary forms in order for us  to release information regarding their care.   Consent: Patient/Guardian gives verbal consent for treatment  and assignment of benefits for services provided during this visit. Patient/Guardian expressed understanding and agreed to proceed.   Elna LILLETTE Lo, MD 8/5/20254:36 PM

## 2023-12-10 ENCOUNTER — Other Ambulatory Visit: Payer: Self-pay

## 2023-12-10 ENCOUNTER — Other Ambulatory Visit (HOSPITAL_COMMUNITY): Payer: Self-pay

## 2023-12-10 ENCOUNTER — Emergency Department (HOSPITAL_COMMUNITY): Admission: EM | Admit: 2023-12-10 | Discharge: 2023-12-10 | Discharge status: Against Medical Advice

## 2023-12-10 DIAGNOSIS — N186 End stage renal disease: Secondary | ICD-10-CM | POA: Insufficient documentation

## 2023-12-10 DIAGNOSIS — Z5329 Procedure and treatment not carried out because of patient's decision for other reasons: Secondary | ICD-10-CM | POA: Diagnosis not present

## 2023-12-10 DIAGNOSIS — Z992 Dependence on renal dialysis: Secondary | ICD-10-CM | POA: Insufficient documentation

## 2023-12-10 DIAGNOSIS — I120 Hypertensive chronic kidney disease with stage 5 chronic kidney disease or end stage renal disease: Secondary | ICD-10-CM | POA: Diagnosis not present

## 2023-12-10 LAB — RENAL FUNCTION PANEL
Albumin: 3.5 g/dL (ref 3.5–5.0)
Anion gap: 15 (ref 5–15)
BUN: 77 mg/dL — ABNORMAL HIGH (ref 6–20)
CO2: 23 mmol/L (ref 22–32)
Calcium: 7.6 mg/dL — ABNORMAL LOW (ref 8.9–10.3)
Chloride: 101 mmol/L (ref 98–111)
Creatinine, Ser: 10.91 mg/dL — ABNORMAL HIGH (ref 0.61–1.24)
GFR, Estimated: 5 mL/min — ABNORMAL LOW (ref >60)
Glucose, Bld: 101 mg/dL — ABNORMAL HIGH (ref 70–99)
Phosphorus: 10.1 mg/dL — ABNORMAL HIGH (ref 2.5–4.6)
Potassium: 4.7 mmol/L (ref 3.5–5.1)
Sodium: 139 mmol/L (ref 135–145)

## 2023-12-10 LAB — CBC WITH DIFFERENTIAL/PLATELET
Abs Immature Granulocytes: 0.03 K/uL (ref 0.00–0.07)
Basophils Absolute: 0.1 K/uL (ref 0.0–0.1)
Basophils Relative: 1 %
Eosinophils Absolute: 0.1 K/uL (ref 0.0–0.5)
Eosinophils Relative: 2 %
HCT: 27.7 % — ABNORMAL LOW (ref 39.0–52.0)
Hemoglobin: 9.2 g/dL — ABNORMAL LOW (ref 13.0–17.0)
Immature Granulocytes: 1 %
Lymphocytes Relative: 19 %
Lymphs Abs: 1.1 K/uL (ref 0.7–4.0)
MCH: 31.1 pg (ref 26.0–34.0)
MCHC: 33.2 g/dL (ref 30.0–36.0)
MCV: 93.6 fL (ref 80.0–100.0)
Monocytes Absolute: 0.5 K/uL (ref 0.1–1.0)
Monocytes Relative: 9 %
Neutro Abs: 4.2 K/uL (ref 1.7–7.7)
Neutrophils Relative %: 68 %
Platelets: 258 K/uL (ref 150–400)
RBC: 2.96 MIL/uL — ABNORMAL LOW (ref 4.22–5.81)
RDW: 15.2 % (ref 11.5–15.5)
WBC: 6 K/uL (ref 4.0–10.5)
nRBC: 0 % (ref 0.0–0.2)

## 2023-12-10 MED ORDER — CHLORHEXIDINE GLUCONATE CLOTH 2 % EX PADS: 6.0000 | MEDICATED_PAD | Freq: Every day | CUTANEOUS | Status: DC

## 2023-12-10 NOTE — ED Triage Notes (Signed)
 Patient is here for his routine hemodialysis treatment . Respirations unlabored /ambulatory .

## 2023-12-10 NOTE — Progress Notes (Signed)
   12/10/23 1612  Vitals  Temp 97.8 F (36.6 C)  Pulse Rate 76  Resp 12  BP 127/77  SpO2 100 %  Post Treatment  Dialyzer Clearance Lightly streaked  Hemodialysis Intake (mL) 0 mL  Liters Processed 84  Fluid Removed (mL) 2000 mL  Tolerated HD Treatment Yes  AVG/AVF Arterial Site Held (minutes) 10 minutes  AVG/AVF Venous Site Held (minutes) 10 minutes   Received patient in bed to unit.  Alert and oriented.  Informed consent signed and in chart.   TX duration:3.5HRS  Patient tolerated well.  Transported back to the room  Alert, without acute distress.  Hand-off given to patient's nurse.   Access used: RAVF Access issues: NONE  Total UF removed: 2L Medication(s) given: NONE   [name]

## 2023-12-10 NOTE — ED Provider Notes (Signed)
 [place] EMERGENCY DEPARTMENT AT [facility] Provider Note   CSN: [id number] Arrival date & time: 12/10/23  9376     Patient presents with: Hemodialysis   Paul [name] is a 58 y.o. male.  {Add pertinent medical, surgical, social history, OB history to HPI:1230} 58 year old male presents today for concern of routine dialysis treatment.  He denies any shortness of breath or other complaints.  He comes to the emergency department for his routine dialysis.  Most recently had this on Monday.  Has not missed any recent sessions.  The history is provided by the patient. No language interpreter was used.       Prior to Admission medications   Medication Sig Start Date End Date Taking? Authorizing Provider  ARIPiprazole  (ABILIFY ) 5 MG tablet Take 1 tablet (5 mg total) by mouth at bedtime. 10/28/23   Plovsky, Elna, MD  carvedilol  (COREG ) 6.25 MG tablet Take 1 tablet (6.25 mg total) by mouth 2 (two) times daily with a meal. 07/29/23 10/28/23  Tawkaliyar, Roya, DO  isosorbide  mononitrate (IMDUR ) 30 MG 24 hr tablet Take 1 tablet (30 mg total) by mouth daily. Patient not taking: Reported on 10/28/2023 10/25/23   [name], MD  rosuvastatin  (CRESTOR ) 5 MG tablet Take 1 tablet (5 mg total) by mouth daily. 03/24/23     sucroferric oxyhydroxide (VELPHORO ) 500 MG chewable tablet Chew 3 tablets (1,500 mg total) by mouth 3 (three) times daily with meals. Patient not taking: Reported on 10/28/2023 07/29/23   Tawkaliyar, Roya, DO    Allergies: Penicillin g    Review of Systems  Constitutional:  Negative for chills and fever.  Respiratory:  Negative for shortness of breath.   Cardiovascular:  Negative for chest pain and leg swelling.  All other systems reviewed and are negative.   Updated Vital Signs BP (!) 186/105 (BP Location: Left Arm)   Pulse 91   Temp 98 F (36.7 C)   Resp 15   SpO2 98%   Physical Exam Vitals and nursing note reviewed.  Constitutional:       General: He is not in acute distress.    Appearance: Normal appearance. He is not ill-appearing.  HENT:     Head: Normocephalic and atraumatic.     Nose: Nose normal.  Eyes:     Conjunctiva/sclera: Conjunctivae normal.  Cardiovascular:     Rate and Rhythm: Normal rate and regular rhythm.  Pulmonary:     Effort: Pulmonary effort is normal. No respiratory distress.  Abdominal:     General: There is no distension.     Palpations: Abdomen is soft.     Tenderness: There is no abdominal tenderness. There is no guarding.  Musculoskeletal:        General: No deformity. Normal range of motion.     Cervical back: Normal range of motion.     Right lower leg: No edema.     Left lower leg: No edema.  Skin:    Findings: No rash.  Neurological:     Mental Status: He is alert.     (all labs ordered are listed, but only abnormal results are displayed) Labs Reviewed  CBC WITH DIFFERENTIAL/PLATELET - Abnormal; Notable for the following components:      Result Value   RBC 2.96 (*)    Hemoglobin 9.2 (*)    HCT 27.7 (*)    All other components within normal limits  RENAL FUNCTION PANEL    EKG: None  Radiology: DG Chest 1 View  Result Date: 12/08/2023 CLINICAL DATA:  Shortness of breath EXAM: CHEST  1 VIEW COMPARISON:  11/04/2023 FINDINGS: Midline trachea. Mild cardiomegaly. No pleural effusion or pneumothorax. No congestive failure. Clear lungs. Apical lordotic positioning IMPRESSION: Cardiomegaly without congestive failure. Electronically Signed   By: Rockey Kilts M.D.   On: 12/08/2023 09:16    {Document cardiac monitor, telemetry assessment procedure when appropriate:32947} Procedures   Medications Ordered in the ED - No data to display    {Click here for ABCD2, HEART and other calculators REFRESH Note before signing:1}                              Medical Decision Making Amount and/or Complexity of Data Reviewed Labs: ordered.   58 year old male presents today for concern of  needing routine dialysis.  He has ESRD and is on Monday Wednesday and Friday schedule.  Does not have a primary dialysis center and comes to the emergency department as his primary dialysis center. Discussed with Dr. Vassie of nephrology.  He will set him up for dialysis. Recently his potassium was 5.6. Will repeat renal function panel. CBC and renal function panel without acute concern.  Electrolytes normal.  Creatinine elevated at baseline.  Final diagnoses:  ESRD (end stage renal disease) [facility])    ED Discharge Orders     None

## 2023-12-10 NOTE — ED Notes (Signed)
 Pt returned from dialysis and left prior to ed physician reassessment

## 2023-12-10 NOTE — Procedures (Signed)
 Asked to see this patient for hospital dialysis. Pt was discharged from his outpatient unit for behavioral issues. The plan will be for ED HD. Pt is not to be admitted at this time. Pt will go to the dialysis unit when they are ready for the patient. When dialysis is completed pt will be sent back to ED for reassessment.   I was present at the procedure, reviewed the HD regimen and made appropriate changes.   Myer Fret MD  [facility] 12/10/2023, 3:52 PM

## 2023-12-12 ENCOUNTER — Encounter (HOSPITAL_COMMUNITY): Payer: Self-pay | Admitting: *Deleted

## 2023-12-12 ENCOUNTER — Emergency Department (HOSPITAL_COMMUNITY)
Admission: EM | Admit: 2023-12-12 | Discharge: 2023-12-12 | Discharge status: Against Medical Advice | Attending: Emergency Medicine | Admitting: Emergency Medicine

## 2023-12-12 ENCOUNTER — Other Ambulatory Visit (HOSPITAL_COMMUNITY): Payer: Self-pay

## 2023-12-12 ENCOUNTER — Other Ambulatory Visit: Payer: Self-pay

## 2023-12-12 DIAGNOSIS — I509 Heart failure, unspecified: Secondary | ICD-10-CM | POA: Diagnosis not present

## 2023-12-12 DIAGNOSIS — Z5329 Procedure and treatment not carried out because of patient's decision for other reasons: Secondary | ICD-10-CM | POA: Insufficient documentation

## 2023-12-12 DIAGNOSIS — Z96643 Presence of artificial hip joint, bilateral: Secondary | ICD-10-CM | POA: Insufficient documentation

## 2023-12-12 DIAGNOSIS — N186 End stage renal disease: Secondary | ICD-10-CM | POA: Insufficient documentation

## 2023-12-12 DIAGNOSIS — Z992 Dependence on renal dialysis: Secondary | ICD-10-CM | POA: Diagnosis not present

## 2023-12-12 DIAGNOSIS — I120 Hypertensive chronic kidney disease with stage 5 chronic kidney disease or end stage renal disease: Secondary | ICD-10-CM | POA: Diagnosis not present

## 2023-12-12 LAB — CBC WITH DIFFERENTIAL/PLATELET
Abs Granulocyte: 3.9 K/uL (ref 1.5–6.5)
Abs Immature Granulocytes: 0.03 K/uL (ref 0.00–0.07)
Basophils Absolute: 0 K/uL (ref 0.0–0.1)
Basophils Relative: 1 %
Eosinophils Absolute: 0.1 K/uL (ref 0.0–0.5)
Eosinophils Relative: 2 %
HCT: 28.8 % — ABNORMAL LOW (ref 39.0–52.0)
Hemoglobin: 9.7 g/dL — ABNORMAL LOW (ref 13.0–17.0)
Immature Granulocytes: 1 %
Lymphocytes Relative: 19 %
Lymphs Abs: 1.1 K/uL (ref 0.7–4.0)
MCH: 30.9 pg (ref 26.0–34.0)
MCHC: 33.7 g/dL (ref 30.0–36.0)
MCV: 91.7 fL (ref 80.0–100.0)
Monocytes Absolute: 0.6 K/uL (ref 0.1–1.0)
Monocytes Relative: 11 %
Neutro Abs: 3.9 K/uL (ref 1.7–7.7)
Neutrophils Relative %: 66 %
Platelets: 192 K/uL (ref 150–400)
RBC: 3.14 MIL/uL — ABNORMAL LOW (ref 4.22–5.81)
RDW: 15.3 % (ref 11.5–15.5)
WBC: 5.9 K/uL (ref 4.0–10.5)
nRBC: 0 % (ref 0.0–0.2)

## 2023-12-12 LAB — RENAL FUNCTION PANEL
Albumin: 3.8 g/dL (ref 3.5–5.0)
Anion gap: 16 — ABNORMAL HIGH (ref 5–15)
BUN: 57 mg/dL — ABNORMAL HIGH (ref 6–20)
CO2: 22 mmol/L (ref 22–32)
Calcium: 7.7 mg/dL — ABNORMAL LOW (ref 8.9–10.3)
Chloride: 101 mmol/L (ref 98–111)
Creatinine, Ser: 9.79 mg/dL — ABNORMAL HIGH (ref 0.61–1.24)
GFR, Estimated: 6 mL/min — ABNORMAL LOW (ref >60)
Glucose, Bld: 83 mg/dL (ref 70–99)
Phosphorus: 9 mg/dL — ABNORMAL HIGH (ref 2.5–4.6)
Potassium: 4.9 mmol/L (ref 3.5–5.1)
Sodium: 139 mmol/L (ref 135–145)

## 2023-12-12 LAB — IRON AND TIBC
Iron: 63 ug/dL (ref 45–182)
Saturation Ratios: 24 % (ref 17.9–39.5)
TIBC: 265 ug/dL (ref 250–450)
UIBC: 202 ug/dL

## 2023-12-12 LAB — HEPATITIS B SURFACE ANTIGEN: Hepatitis B Surface Ag: NONREACTIVE

## 2023-12-12 LAB — FERRITIN: Ferritin: 993 ng/mL — ABNORMAL HIGH (ref 24–336)

## 2023-12-12 MED ORDER — HEPARIN SODIUM (PORCINE) 1000 UNIT/ML DIALYSIS: 1000.0000 [IU] | INTRAMUSCULAR | Status: DC | PRN

## 2023-12-12 MED ORDER — LIDOCAINE HCL (PF) 1 % IJ SOLN: 5.0000 mL | INTRAMUSCULAR | Status: DC | PRN

## 2023-12-12 MED ORDER — HEPARIN SODIUM (PORCINE) 1000 UNIT/ML DIALYSIS
2400.0000 [IU] | Freq: Once | INTRAMUSCULAR | Status: AC
  Administered 2023-12-12: 2400 [IU] via INTRAVENOUS_CENTRAL

## 2023-12-12 MED ORDER — PENTAFLUOROPROP-TETRAFLUOROETH EX AERO
1.0000 | INHALATION_SPRAY | CUTANEOUS | Status: DC | PRN
Start: 2023-12-12 — End: 2023-12-12

## 2023-12-12 MED ORDER — LIDOCAINE-PRILOCAINE 2.5-2.5 % EX CREA: 1.0000 | TOPICAL_CREAM | CUTANEOUS | Status: DC | PRN

## 2023-12-12 MED ORDER — ALTEPLASE 2 MG IJ SOLR: 2.0000 mg | Freq: Once | INTRAMUSCULAR | Status: DC | PRN

## 2023-12-12 MED ORDER — CHLORHEXIDINE GLUCONATE CLOTH 2 % EX PADS: 6.0000 | MEDICATED_PAD | Freq: Every day | CUTANEOUS | Status: DC

## 2023-12-12 MED ORDER — ANTICOAGULANT SODIUM CITRATE 4% (200MG/5ML) IV SOLN: 5.0000 mL | Status: DC | PRN

## 2023-12-12 MED ORDER — NEPRO/CARBSTEADY PO LIQD: 237.0000 mL | ORAL | Status: DC | PRN

## 2023-12-12 NOTE — ED Provider Notes (Signed)
 [place] EMERGENCY DEPARTMENT AT [facility] Provider Note  MDM   HPI/ROS:  Paul [name] is a 58 y.o. male with a medical history as below who presents for routine dialysis.  Patient reports Monday Wednesday Friday dialysis schedule and states that he was most recently dialyzed 2 days ago.  He denies any medical problems at this time.    On my initial evaluation, patient is:  -Vital signs stable. Patient afebrile, hemodynamically stable, and non-toxic appearing. -Additional history obtained from chart review  This patient's current presentation, including their history and physical exam, is most consistent with routine dialysis. Differentials include electrolyte abnormalities, routine dialysis, ESRD.    Physical exam is reassuring as patient is comfortable appearing.  Clinical history reassuring as he had recent dialysis 2 days ago and has no medical complaints at this time.  Did reach out to nephrology for routine dialysis which they will plan for today.  He did not require further emergent intervention or care prior to leaving for dialysis.  Patient will likely return to the ED for reassessment however anticipate he will be appropriate for discharge pending any clinical course change.  Interpretations, interventions, and the patient's course of care are documented below.     Results of electrolytes returned after patient was taken for dialysis.  Shows mild hyperkalemia to 5.6 and uremia to 120, hypocalcemia and an anion gap acidosis likely 2/2 uremia.  CBC with anemia slightly worse than baseline.  Disposition:  I discussed the plan for discharge with the patient and/or their surrogate at bedside prior to discharge and they were in agreement with the plan and verbalized understanding of the return precautions provided. All questions answered to the best of my ability. Ultimately, the patient was discharged in stable condition with stable vital signs. I am reassured that they  are capable of close follow up and good social support at home.   Clinical Impression:  1. ESRD (end stage renal disease) (HCC)     The plan for this patient was discussed with Dr. Yolande, who voiced agreement and who oversaw evaluation and treatment of this patient.   Clinical Complexity A medically appropriate history, review of systems, and physical exam was performed.  My independent interpretations of EKG, labs, and radiology are documented in the ED course above.   If decision rules were used in this patient's evaluation, they are listed below.   Click here for ABCD2, HEART and other calculatorsREFRESH Note before signing   Patient's presentation is most consistent with exacerbation of chronic illness.  Medical Decision Making Amount and/or Complexity of Data Reviewed Labs: ordered.    HPI/ROS      See MDM section for pertinent HPI and ROS. A complete ROS was performed with pertinent positives/negatives noted above.   Past Medical History:  Diagnosis Date   Bipolar disorder (HCC)    Central retinal artery occlusion    with macular infarction of the right eye. status post posterior vitrectomy and photocoagulation with insertion of a shunt in 2006.   CHF (congestive heart failure) (HCC)    chronic mixed syst/diast. 02/2009 echo: Systolic function was mildly reduced. The estimated ejection fraction was in 45%, global HK, Grade I Diast dysfxn.   Chronic kidney disease    ESRD   Heart failure    Obesity    Osteoarthritis    s/p right hip replacement in 2001   Schizophrenia (HCC)    Severe uncontrolled hypertension     Past Surgical History:  Procedure Laterality  Date   A/V SHUNT INTERVENTION N/A 10/08/2023   Procedure: A/V SHUNT INTERVENTION;  Surgeon: Sheree Penne Bruckner, MD;  Location: [facility];  Service: Cardiovascular;  Laterality: N/A;   AV FISTULA PLACEMENT Right 07/08/2022   Procedure: RIGHT RADIOCEPHALIC ARTERIOVENOUS (AV) FISTULA CREATION;  Surgeon:  Lanis Fonda BRAVO, MD;  Location: [facility] OR;  Service: Vascular;  Laterality: Right;   IR FLUORO GUIDE CV LINE RIGHT  05/13/2022   IR US  GUIDE VASC ACCESS RIGHT  05/13/2022   JOINT REPLACEMENT     right hip replacement   right eye surgery     for glaucoma   TOTAL HIP ARTHROPLASTY Bilateral 1997, 2001   UPPER EXTREMITY INTERVENTION  10/08/2023   Procedure: UPPER EXTREMITY INTERVENTION;  Surgeon: Sheree Penne Bruckner, MD;  Location: [facility];  Service: Cardiovascular;;  Stent   VENOUS ANGIOPLASTY  10/08/2023   Procedure: VENOUS ANGIOPLASTY;  Surgeon: Sheree Penne Bruckner, MD;  Location: [facility];  Service: Cardiovascular;;      Physical Exam   Vitals:   12/12/23 0621 12/12/23 0624  BP:  (!) 161/85  Pulse:  84  Resp:  18  Temp:  98.5 F (36.9 C)  SpO2:  97%  Weight: 81 kg   Height: 5' 3 (1.6 m)     Physical Exam Vitals and nursing note reviewed.  Constitutional:      General: He is not in acute distress.    Appearance: He is well-developed.  HENT:     Head: Normocephalic and atraumatic.  Eyes:     Conjunctiva/sclera: Conjunctivae normal.  Cardiovascular:     Rate and Rhythm: Normal rate and regular rhythm.     Heart sounds: No murmur heard. Pulmonary:     Effort: Pulmonary effort is normal. No respiratory distress.     Breath sounds: Normal breath sounds.  Abdominal:     Palpations: Abdomen is soft.     Tenderness: There is no abdominal tenderness.  Musculoskeletal:        General: No swelling.     Cervical back: Neck supple.  Skin:    General: Skin is warm and dry.     Capillary Refill: Capillary refill takes less than 2 seconds.  Neurological:     Mental Status: He is alert.  Psychiatric:        Mood and Affect: Mood normal.      Procedures   If procedures were preformed on this patient, they are listed below:  Procedures   @BBSIG @   Please note that this documentation was produced with the assistance of voice-to-text technology and may contain  errors.    Sharyne Darina RAMAN, MD 12/12/23 1448    Yolande Lamar BROCKS, MD 12/19/23 [phone]

## 2023-12-12 NOTE — ED Triage Notes (Signed)
 Patient is here for routine dialysis , no other complaints.

## 2023-12-12 NOTE — Progress Notes (Signed)
 Received patient in bed to unit.  Alert and oriented.  Informed consent signed and in chart.   TX duration:3.5 hours  Patient tolerated well.  Transported back to the room  Alert, without acute distress.  Hand-off given to patient's nurse.   Access used: Right Upper Arm Fistula Access issues: none  Total UF removed: 3L Medication(s) given: none   12/12/23 1150  Vitals  Temp (!) 97.5 F (36.4 C)  BP 130/80  Pulse Rate 99  Resp 18  Oxygen Therapy  SpO2 100 %  O2 Device Room Air  Patient Activity (if Appropriate) In bed  Pulse Oximetry Type Continuous  During Treatment Monitoring  Blood Flow Rate (mL/min) 400 mL/min  Arterial Pressure (mmHg) -168.27 mmHg  Venous Pressure (mmHg) 273.32 mmHg  TMP (mmHg) 6.87 mmHg  Ultrafiltration Rate (mL/min) 751 mL/min  Dialysate Flow Rate (mL/min) 300 ml/min  Duration of HD Treatment -hour(s) 3.46 hour(s)  Cumulative Fluid Removed (mL) per Treatment  2971.13  HD Safety Checks Performed Yes  Intra-Hemodialysis Comments Tx completed;Tolerated well  Post Treatment  Dialyzer Clearance Clear  Liters Processed 84  Fluid Removed (mL) 3000 mL  Tolerated HD Treatment Yes  Fistula / Graft Right Upper arm Arteriovenous fistula  No placement date or time found.   Placed prior to admission: Yes  Orientation: Right  Access Location: Upper arm  Access Type: Arteriovenous fistula  Status Deaccessed     Camellia Brasil LPN Kidney Dialysis Unit

## 2023-12-12 NOTE — Procedures (Signed)
 Asked to see this patient for hospital dialysis.  The plan will be for ED HD. Pt is not to be admitted at this time. Pt will go to the dialysis unit when they are ready for the patient. When dialysis is completed pt will be sent back to ED for reassessment.     Last OP HD unit info --> pt released 07/2023 from [facility] 4h  B400  76.5kg  2K bath    RUA AVF  Heparin  2400    Clinical issues other than volume: 1) Anemia of esrd:  - pt dose not have a nephrologist or HD unit so does not have access to ESA or IV iron  other than when here for hospital HD sessions - last tsat 24% and ferritin 1828 on 09/05/23-> ferritin too high to give IV fe - recent Hb in the 7.9- 9.7  range - last 3 doses of darbepoeitin here -->  - 100 mcg 10/10/23 - 200 mcg 10/17/23 - 100 mcg 11/18/23   - is overdue for ESA   PLAN: --> give esa later this week

## 2023-12-12 NOTE — ED Notes (Signed)
 Patient was returned from dialysis , patient got up and left without talking  to anyone.

## 2023-12-12 NOTE — ED Notes (Signed)
 Phlembotomy to get labs

## 2023-12-13 LAB — HEPATITIS B SURFACE ANTIBODY, QUANTITATIVE: Hep B S AB Quant (Post): 6088 m[IU]/mL

## 2023-12-15 ENCOUNTER — Other Ambulatory Visit: Payer: Self-pay

## 2023-12-15 ENCOUNTER — Emergency Department (HOSPITAL_COMMUNITY)
Admission: EM | Admit: 2023-12-15 | Discharge: 2023-12-16 | Disposition: A | Discharge status: Home / Self Care | Attending: Emergency Medicine | Admitting: Emergency Medicine

## 2023-12-15 ENCOUNTER — Encounter (HOSPITAL_COMMUNITY): Payer: Self-pay | Admitting: *Deleted

## 2023-12-15 DIAGNOSIS — D631 Anemia in chronic kidney disease: Secondary | ICD-10-CM | POA: Diagnosis not present

## 2023-12-15 DIAGNOSIS — Z992 Dependence on renal dialysis: Secondary | ICD-10-CM | POA: Insufficient documentation

## 2023-12-15 DIAGNOSIS — I120 Hypertensive chronic kidney disease with stage 5 chronic kidney disease or end stage renal disease: Secondary | ICD-10-CM | POA: Diagnosis not present

## 2023-12-15 DIAGNOSIS — R9431 Abnormal electrocardiogram [ECG] [EKG]: Secondary | ICD-10-CM | POA: Insufficient documentation

## 2023-12-15 DIAGNOSIS — N186 End stage renal disease: Secondary | ICD-10-CM

## 2023-12-15 LAB — CBC WITH DIFFERENTIAL/PLATELET
Abs Immature Granulocytes: 0.03 K/uL (ref 0.00–0.07)
Basophils Absolute: 0 K/uL (ref 0.0–0.1)
Basophils Relative: 1 %
Eosinophils Absolute: 0.2 K/uL (ref 0.0–0.5)
Eosinophils Relative: 2 %
HCT: 26.3 % — ABNORMAL LOW (ref 39.0–52.0)
Hemoglobin: 8.6 g/dL — ABNORMAL LOW (ref 13.0–17.0)
Immature Granulocytes: 0 %
Lymphocytes Relative: 19 %
Lymphs Abs: 1.3 K/uL (ref 0.7–4.0)
MCH: 31.2 pg (ref 26.0–34.0)
MCHC: 32.7 g/dL (ref 30.0–36.0)
MCV: 95.3 fL (ref 80.0–100.0)
Monocytes Absolute: 0.6 K/uL (ref 0.1–1.0)
Monocytes Relative: 9 %
Neutro Abs: 4.9 K/uL (ref 1.7–7.7)
Neutrophils Relative %: 69 %
Platelets: 235 K/uL (ref 150–400)
RBC: 2.76 MIL/uL — ABNORMAL LOW (ref 4.22–5.81)
RDW: 15.5 % (ref 11.5–15.5)
WBC: 7.1 K/uL (ref 4.0–10.5)
nRBC: 0 % (ref 0.0–0.2)

## 2023-12-15 LAB — RENAL FUNCTION PANEL
Albumin: 3.6 g/dL (ref 3.5–5.0)
Anion gap: 19 — ABNORMAL HIGH (ref 5–15)
BUN: 85 mg/dL — ABNORMAL HIGH (ref 6–20)
CO2: 19 mmol/L — ABNORMAL LOW (ref 22–32)
Calcium: 7.4 mg/dL — ABNORMAL LOW (ref 8.9–10.3)
Chloride: 99 mmol/L (ref 98–111)
Creatinine, Ser: 12.38 mg/dL — ABNORMAL HIGH (ref 0.61–1.24)
GFR, Estimated: 4 mL/min — ABNORMAL LOW (ref >60)
Glucose, Bld: 118 mg/dL — ABNORMAL HIGH (ref 70–99)
Phosphorus: 10.5 mg/dL — ABNORMAL HIGH (ref 2.5–4.6)
Potassium: 5 mmol/L (ref 3.5–5.1)
Sodium: 137 mmol/L (ref 135–145)

## 2023-12-15 LAB — HEPATITIS B SURFACE ANTIGEN: Hepatitis B Surface Ag: NONREACTIVE

## 2023-12-15 MED ORDER — CHLORHEXIDINE GLUCONATE CLOTH 2 % EX PADS: 6.0000 | MEDICATED_PAD | Freq: Every day | CUTANEOUS | Status: DC

## 2023-12-15 MED ORDER — DARBEPOETIN ALFA 150 MCG/0.3ML IJ SOSY
150.0000 ug | PREFILLED_SYRINGE | Freq: Once | INTRAMUSCULAR | Status: AC
  Administered 2023-12-15 (×2): 150 ug via SUBCUTANEOUS
  Filled 2023-12-15: qty 0.3

## 2023-12-15 NOTE — Progress Notes (Signed)
 Received patient in bed to unit.  Alert and oriented.  Informed consent signed and in chart.   TX duration:3.5 hours  Patient 1st cartridge clotted off at 2 hour mark.  Had to change system.  Transported back to the room  Alert, without acute distress.  Hand-off given to patient's nurse.   Access used: Right Upper arm fistula Access issues: none  Total UF removed: 3L Medication(s) given: Arenesp   12/15/23 1712  Vitals  Temp 97.8 F (36.6 C)  Temp Source Oral  BP (!) 127/102  MAP (mmHg) 109  Pulse Rate 84  ECG Heart Rate 88  Resp 17  Oxygen Therapy  SpO2 100 %  O2 Device Room Air  During Treatment Monitoring  Duration of HD Treatment -hour(s) 3.5 hour(s)  HD Safety Checks Performed Yes  Intra-Hemodialysis Comments Tx completed  Post Treatment  Dialyzer Clearance Clear  Liters Processed 74.3  Fluid Removed (mL) 3000 mL  Tolerated HD Treatment No (Comment)  Post-Hemodialysis Comments Patient cartridge clotted off and had to change set up.  AVG/AVF Arterial Site Held (minutes) 7 minutes  AVG/AVF Venous Site Held (minutes) 7 minutes  Fistula / Graft Right Upper arm Arteriovenous fistula  No placement date or time found.   Placed prior to admission: Yes  Orientation: Right  Access Location: Upper arm  Access Type: Arteriovenous fistula  Status Deaccessed     Camellia Brasil LPN Kidney Dialysis Unit

## 2023-12-15 NOTE — ED Provider Notes (Signed)
 [place] EMERGENCY DEPARTMENT AT [facility] Provider Note   CSN: [id number] Arrival date & time: 12/15/23  9347     Patient presents with: Vascular Access Problem   Paul [name] is a 58 y.o. male. Patient with ESRD on HD MWF presents to the ED for routine dialysis. Last dialysis session was 3 days ago on Friday. Denies any acute concerns at this time. Reports that he does not believe he overdid it with fluids this weekend.   HPI     Prior to Admission medications   Medication Sig Start Date End Date Taking? Authorizing Provider  ARIPiprazole  (ABILIFY ) 5 MG tablet Take 1 tablet (5 mg total) by mouth at bedtime. 10/28/23   Plovsky, Elna, MD  carvedilol  (COREG ) 6.25 MG tablet Take 1 tablet (6.25 mg total) by mouth 2 (two) times daily with a meal. 07/29/23 10/28/23  Tawkaliyar, Roya, DO  isosorbide  mononitrate (IMDUR ) 30 MG 24 hr tablet Take 1 tablet (30 mg total) by mouth daily. Patient not taking: Reported on 10/28/2023 10/25/23   [name], MD  rosuvastatin  (CRESTOR ) 5 MG tablet Take 1 tablet (5 mg total) by mouth daily. 03/24/23     sucroferric oxyhydroxide (VELPHORO ) 500 MG chewable tablet Chew 3 tablets (1,500 mg total) by mouth 3 (three) times daily with meals. Patient not taking: Reported on 10/28/2023 07/29/23   Tawkaliyar, Roya, DO    Allergies: Penicillin g    Review of Systems  Constitutional:        Dialysis concerns  All other systems reviewed and are negative.   Updated Vital Signs BP (!) 178/84   Pulse 70   Temp 98 F (36.7 C) (Oral)   Resp 14   Ht 5' 3 (1.6 m)   Wt 81 kg   SpO2 99%   BMI 31.63 kg/m   Physical Exam Vitals and nursing note reviewed.  Constitutional:      General: He is not in acute distress.    Appearance: He is well-developed.  HENT:     Head: Normocephalic and atraumatic.  Eyes:     Conjunctiva/sclera: Conjunctivae normal.  Cardiovascular:     Rate and Rhythm: Normal rate and regular rhythm.     Heart  sounds: No murmur heard. Pulmonary:     Effort: Pulmonary effort is normal. No respiratory distress.     Breath sounds: Normal breath sounds.  Abdominal:     Palpations: Abdomen is soft.     Tenderness: There is no abdominal tenderness.  Musculoskeletal:        General: No swelling.     Cervical back: Neck supple.     Right lower leg: No edema.     Left lower leg: No edema.  Skin:    General: Skin is warm and dry.     Capillary Refill: Capillary refill takes less than 2 seconds.  Neurological:     Mental Status: He is alert.  Psychiatric:        Mood and Affect: Mood normal.     (all labs ordered are listed, but only abnormal results are displayed) Labs Reviewed  CBC WITH DIFFERENTIAL/PLATELET - Abnormal; Notable for the following components:      Result Value   RBC 2.76 (*)    Hemoglobin 8.6 (*)    HCT 26.3 (*)    All other components within normal limits  RENAL FUNCTION PANEL - Abnormal; Notable for the following components:   CO2 19 (*)    Glucose, Bld 118 (*)  BUN 85 (*)    Creatinine, Ser 12.38 (*)    Calcium  7.4 (*)    Phosphorus 10.5 (*)    GFR, Estimated 4 (*)    Anion gap 19 (*)    All other components within normal limits  HEPATITIS B SURFACE ANTIGEN  HEPATITIS B SURFACE ANTIBODY, QUANTITATIVE    EKG: None  Radiology: No results found.   Procedures   Medications Ordered in the ED  Chlorhexidine  Gluconate Cloth 2 % PADS 6 each (has no administration in time range)                                    Medical Decision Making Amount and/or Complexity of Data Reviewed Labs: ordered.   This patient presents to the ED for concern of ESRD on hemodialysis.  Differential diagnosis includes routine dialysis, hyperkalemia, dehydration   Lab Tests:  I Ordered, and personally interpreted labs.  The pertinent results include: CBC with hemoglobin 8.6 which is somewhat consistent with priors, renal function shows GFR at 4 and anion gap of at 19 with  creatinine at 12.38 all consistent with patient's ESRD   Problem List / ED Course:  \Patient with past history significant for ESRD on hemodialysis Monday, Wednesday, Friday presents to the emergency department for routine dialysis.  He reports no specific concerns at this time.  No reported chest pain, shortness of breath, leg swelling or edema.  States that he believes that he did not overdo it with fluid intake this weekend.  States his last dialysis session was 3 days ago on Friday, 12/12/2023. Physical exam unremarkable.  No normal heart or lung sounds.  No abdominal tenderness.  No lower extremity swelling or edema.  Will reach out to nephrology. Spoke with Dr. Rayburn, nephrology, to have patient brought in for dialysis session today. Patient will be taken for HD shortly and afterwards likely stable for discharge once returned back to the ED.   Social Determinants of Health:  ESRD on HD  Final diagnoses:  ESRD on dialysis [facility])    ED Discharge Orders     None          Cecily Legrand LABOR, PA-C 12/15/23 1118    Pamella Sharper A, DO 12/22/23 1113

## 2023-12-15 NOTE — ED Notes (Signed)
Pt transported to dialysis unit.  

## 2023-12-15 NOTE — Procedures (Signed)
 I was present at this dialysis session. I have reviewed the session itself and made appropriate changes. Hgb dropping, will order Aranesp  150 mcg today (last dose 11/18/23)  Vital signs in last 24 hours:  Temp:  [97.5 F (36.4 C)-98 F (36.7 C)] 97.6 F (36.4 C) (08/11 1247) Pulse Rate:  [70-91] 72 (08/11 1257) Resp:  [14-20] 18 (08/11 1257) BP: (136-194)/(83-100) 143/83 (08/11 1257) SpO2:  [99 %-100 %] 100 % (08/11 1257) Weight:  [81 kg-83.5 kg] 83.5 kg (08/11 1247) Weight change:  Filed Weights   12/15/23 0657 12/15/23 1245 12/15/23 1247  Weight: 81 kg 83.5 kg 83.5 kg    Recent Labs  Lab 12/15/23 0835  NA 137  K 5.0  CL 99  CO2 19*  GLUCOSE 118*  BUN 85*  CREATININE 12.38*  CALCIUM  7.4*  PHOS 10.5*    Recent Labs  Lab 12/10/23 0730 12/12/23 0742 12/15/23 0751  WBC 6.0 5.9 7.1  NEUTROABS 4.2 3.9 4.9  HGB 9.2* 9.7* 8.6*  HCT 27.7* 28.8* 26.3*  MCV 93.6 91.7 95.3  PLT 258 192 235    Scheduled Meds:  Chlorhexidine  Gluconate Cloth  6 each Topical Q0600   darbepoetin (ARANESP ) injection - DIALYSIS  150 mcg Subcutaneous Once   Continuous Infusions: PRN Meds:.   Fairy DELENA Sellar,  MD 12/15/2023, 1:01 PM

## 2023-12-15 NOTE — ED Triage Notes (Signed)
 Patient is here for routine dialysis , here fri and had full treatment

## 2023-12-16 LAB — HEPATITIS B SURFACE ANTIBODY, QUANTITATIVE: Hep B S AB Quant (Post): 5622 m[IU]/mL

## 2023-12-17 ENCOUNTER — Other Ambulatory Visit: Payer: Self-pay

## 2023-12-17 ENCOUNTER — Emergency Department (HOSPITAL_COMMUNITY)
Admission: EM | Admit: 2023-12-17 | Discharge: 2023-12-17 | Discharge status: Against Medical Advice | Attending: Emergency Medicine | Admitting: Emergency Medicine

## 2023-12-17 DIAGNOSIS — Z452 Encounter for adjustment and management of vascular access device: Secondary | ICD-10-CM | POA: Diagnosis not present

## 2023-12-17 DIAGNOSIS — Z5321 Procedure and treatment not carried out due to patient leaving prior to being seen by health care provider: Secondary | ICD-10-CM | POA: Diagnosis not present

## 2023-12-17 NOTE — ED Notes (Signed)
 Have not seen pt since this morning, pt name was called several times

## 2023-12-17 NOTE — Progress Notes (Addendum)
 Received a call from pt this morning requesting a referral be made to [facility] for out-pt HD. Pt made aware that the [facility] clinic is not open to new pts as of yet. Pt states he is agreeable to a referral being made to [facility] and states he can get transportation there if accepted there. Advised pt that if for some reason pt's referral is denied for [facility], it will likely delay when a referral can be made to [facility] clinic when they are able to accept new pt's. Pt wants to proceed with referral to [facility]. Will attempt to submit referral this afternoon vs tomorrow am.   Randine Mungo Dialysis Navigator [phone]  Addendum at 2:20 pm: Referral submitted to [facility] admissions for review.

## 2023-12-17 NOTE — Group Note (Deleted)
 Date:  12/17/2023 Time:  2:22 PM  Group Topic/Focus:  Wellness Toolbox:   The focus of this group is to discuss various aspects of wellness, balancing those aspects and exploring ways to increase the ability to experience wellness.  Patients will create a wellness toolbox for use upon discharge.     Participation Level:  {BHH PARTICIPATION OZCZO:77735}  Participation Quality:  {BHH PARTICIPATION QUALITY:22265}  Affect:  {BHH AFFECT:22266}  Cognitive:  {BHH COGNITIVE:22267}  Insight: {BHH Insight2:20797}  Engagement in Group:  {BHH ENGAGEMENT IN HMNLE:77731}  Modes of Intervention:  {BHH MODES OF INTERVENTION:22269}  Additional Comments:  ***  Myra Curtistine BROCKS 12/17/2023, 2:22 PM

## 2023-12-17 NOTE — ED Triage Notes (Signed)
 Pt. Here for dialysis, Last treatment Monday, completed treatment.

## 2023-12-19 ENCOUNTER — Other Ambulatory Visit: Payer: Self-pay

## 2023-12-19 ENCOUNTER — Emergency Department (HOSPITAL_COMMUNITY): Admission: EM | Admit: 2023-12-19 | Discharge: 2023-12-19 | Disposition: A | Discharge status: Home / Self Care

## 2023-12-19 DIAGNOSIS — N186 End stage renal disease: Secondary | ICD-10-CM | POA: Insufficient documentation

## 2023-12-19 DIAGNOSIS — Z992 Dependence on renal dialysis: Secondary | ICD-10-CM | POA: Diagnosis not present

## 2023-12-19 DIAGNOSIS — I120 Hypertensive chronic kidney disease with stage 5 chronic kidney disease or end stage renal disease: Secondary | ICD-10-CM | POA: Diagnosis not present

## 2023-12-19 LAB — BASIC METABOLIC PANEL WITH GFR
Anion gap: 17 — ABNORMAL HIGH (ref 5–15)
BUN: 109 mg/dL — ABNORMAL HIGH (ref 6–20)
CO2: 18 mmol/L — ABNORMAL LOW (ref 22–32)
Calcium: 7.6 mg/dL — ABNORMAL LOW (ref 8.9–10.3)
Chloride: 100 mmol/L (ref 98–111)
Creatinine, Ser: 14.41 mg/dL — ABNORMAL HIGH (ref 0.61–1.24)
GFR, Estimated: 4 mL/min — ABNORMAL LOW (ref >60)
Glucose, Bld: 93 mg/dL (ref 70–99)
Potassium: 5.2 mmol/L — ABNORMAL HIGH (ref 3.5–5.1)
Sodium: 135 mmol/L (ref 135–145)

## 2023-12-19 LAB — CBC
HCT: 25.9 % — ABNORMAL LOW (ref 39.0–52.0)
Hemoglobin: 8.6 g/dL — ABNORMAL LOW (ref 13.0–17.0)
MCH: 30.7 pg (ref 26.0–34.0)
MCHC: 33.2 g/dL (ref 30.0–36.0)
MCV: 92.5 fL (ref 80.0–100.0)
Platelets: 233 K/uL (ref 150–400)
RBC: 2.8 MIL/uL — ABNORMAL LOW (ref 4.22–5.81)
RDW: 15.2 % (ref 11.5–15.5)
WBC: 5.8 K/uL (ref 4.0–10.5)
nRBC: 0 % (ref 0.0–0.2)

## 2023-12-19 MED ORDER — CHLORHEXIDINE GLUCONATE CLOTH 2 % EX PADS: 6.0000 | MEDICATED_PAD | Freq: Every day | CUTANEOUS | Status: DC

## 2023-12-19 NOTE — Progress Notes (Signed)
   12/19/23 1650  Vitals  Temp 97.9 F (36.6 C)  Pulse Rate 96  Resp 18  BP (!) 131/91  SpO2 100 %  O2 Device Room Air  Weight 81.1 kg  Type of Weight Post-Dialysis  Oxygen Therapy  Patient Activity (if Appropriate) In bed  Pulse Oximetry Type Continuous  Oximetry Probe Site Changed No  Post Treatment  Dialyzer Clearance Lightly streaked  Liters Processed 82.8  Fluid Removed (mL) 2500 mL  Tolerated HD Treatment Yes  AVG/AVF Arterial Site Held (minutes) 6 minutes  AVG/AVF Venous Site Held (minutes) 6 minutes   Received patient in bed to unit.  Alert and oriented.  Informed consent signed and in chart.   TX duration:3.5  Patient tolerated well.  Transported back to the room  Alert, without acute distress.  Hand-off given to patient's nurse.   Access used: Yes Access issues: No  Total UF removed: 2500 Medication(s) given: See MAR Post HD VS: See Above Grid Post HD weight: 81.1 kg   Zebedee DELENA Mace Kidney Dialysis Unit

## 2023-12-19 NOTE — ED Triage Notes (Signed)
 Patient is here for his routine hemodialysis treatment . No other complaints . Respirations unlabored .

## 2023-12-19 NOTE — ED Provider Notes (Signed)
 [facility] Provider Note   CSN: [id number] Arrival date & time: 12/19/23  9347     Patient presents with: Hemodialysis   Paul [name] is a 58 y.o. male.   58 year old male presenting emergency department for dialysis.  Uses the emergency department for his routine dialysis.  Monday Wednesday Friday, however missed Wednesday.  He has no complaints.  No chest pain or shortness of breath.        Prior to Admission medications   Medication Sig Start Date End Date Taking? Authorizing Provider  ARIPiprazole  (ABILIFY ) 5 MG tablet Take 1 tablet (5 mg total) by mouth at bedtime. 10/28/23   Plovsky, Elna, MD  carvedilol  (COREG ) 6.25 MG tablet Take 1 tablet (6.25 mg total) by mouth 2 (two) times daily with a meal. 07/29/23 10/28/23  Tawkaliyar, Roya, DO  isosorbide  mononitrate (IMDUR ) 30 MG 24 hr tablet Take 1 tablet (30 mg total) by mouth daily. Patient not taking: Reported on 10/28/2023 10/25/23   [name], MD  rosuvastatin  (CRESTOR ) 5 MG tablet Take 1 tablet (5 mg total) by mouth daily. 03/24/23     sucroferric oxyhydroxide (VELPHORO ) 500 MG chewable tablet Chew 3 tablets (1,500 mg total) by mouth 3 (three) times daily with meals. Patient not taking: Reported on 10/28/2023 07/29/23   Tawkaliyar, Roya, DO    Allergies: Penicillin g    Review of Systems  Updated Vital Signs BP 129/74   Pulse 84   Temp 97.9 F (36.6 C)   Resp 14   Wt 83 kg   SpO2 100%   BMI 32.41 kg/m   Physical Exam Vitals and nursing note reviewed.  Constitutional:      General: He is not in acute distress.    Appearance: He is not toxic-appearing.  HENT:     Head: Normocephalic.  Eyes:     Conjunctiva/sclera: Conjunctivae normal.  Cardiovascular:     Pulses: Normal pulses.     Heart sounds: Normal heart sounds.  Pulmonary:     Effort: Pulmonary effort is normal.     Breath sounds: Normal breath sounds.  Abdominal:     General: Abdomen is flat.      Palpations: Abdomen is soft.  Neurological:     Mental Status: He is alert.     (all labs ordered are listed, but only abnormal results are displayed) Labs Reviewed  CBC - Abnormal; Notable for the following components:      Result Value   RBC 2.80 (*)    Hemoglobin 8.6 (*)    HCT 25.9 (*)    All other components within normal limits  BASIC METABOLIC PANEL WITH GFR - Abnormal; Notable for the following components:   Potassium 5.2 (*)    CO2 18 (*)    BUN 109 (*)    Creatinine, Ser 14.41 (*)    Calcium  7.6 (*)    GFR, Estimated 4 (*)    Anion gap 17 (*)    All other components within normal limits  HEPATITIS B SURFACE ANTIGEN  HEPATITIS B SURFACE ANTIBODY, QUANTITATIVE    EKG: None  Radiology: No results found.   Procedures   Medications Ordered in the ED  Chlorhexidine  Gluconate Cloth 2 % PADS 6 each (has no administration in time range)    Clinical Course as of 12/19/23 1601  Fri Dec 19, 2023  0719 Here for his routine dialysis.  He last had dialysis Monday.  He missed Wednesday.  He is asymptomatic.  Denies headache, chest pain, shortness of breath. [TY]  [id number] Spoke with nephrology.  Will plan to dialyze, but likely will not be till second shift.  They are recommending basic labs at this time. [TY]  [id number] Potassium(!): 5.2 Mild elevation.  No EKG changes consistent with hyperkalemia [TY]  0858 Basic metabolic panel(!) Consistent with known ESRD [TY]    Clinical Course User Index [TY] Paul Caron PARAS, DO                                 Medical Decision Making 58 year old male presenting emergency department for dialysis.  He is afebrile nontachycardic, slightly hypertensive maintaining his oxygen saturation on room air.  Does not appear to be in overt respiratory distress.  Fistula with palpable thrill.  Discussed with nephrology who will dialyze today.  Did have a minor elevated potassium, but no EKG changes.  Stable anemia.  Patient to be evaluated once he  returns to the emergency department, likely discharge home.  Amount and/or Complexity of Data Reviewed Labs: ordered. Decision-making details documented in ED Course. ECG/medicine tests: ordered.       Final diagnoses:  None    ED Discharge Orders     None          Paul Caron PARAS, DO 12/19/23 1601

## 2023-12-19 NOTE — Procedures (Signed)
 I was present at this dialysis session. I have reviewed the session itself and made appropriate changes.   Vital signs in last 24 hours:  Temp:  [97.3 F (36.3 C)-97.9 F (36.6 C)] 97.9 F (36.6 C) (08/15 1302) Pulse Rate:  [70-84] 84 (08/15 1330) Resp:  [13-19] 15 (08/15 1330) BP: (132-158)/(86-99) 154/87 (08/15 1305) SpO2:  [99 %-100 %] 100 % (08/15 1330) Weight:  [83 kg] 83 kg (08/15 1302) Weight change:  Filed Weights   12/19/23 1302  Weight: 83 kg    Recent Labs  Lab 12/15/23 0835 12/19/23 0740  NA 137 135  K 5.0 5.2*  CL 99 100  CO2 19* 18*  GLUCOSE 118* 93  BUN 85* 109*  CREATININE 12.38* 14.41*  CALCIUM  7.4* 7.6*  PHOS 10.5*  --     Recent Labs  Lab 12/15/23 0751 12/19/23 0740  WBC 7.1 5.8  NEUTROABS 4.9  --   HGB 8.6* 8.6*  HCT 26.3* 25.9*  MCV 95.3 92.5  PLT 235 233    Scheduled Meds:  Chlorhexidine  Gluconate Cloth  6 each Topical Q0600   Continuous Infusions: PRN Meds:.   Fairy DELENA Sellar,  MD 12/19/2023, 1:51 PM

## 2023-12-19 NOTE — Progress Notes (Signed)
 Navigator contacted by [facility] admissions this morning to say pt was denied by provider at [facility] and that pt's referral will be cancelled for this reason. Met with pt at bedside today while receiving HD. Pt made aware of this information. Navigator has also been advised that if pt was denied for [facility] then that would be a denial for [facility] as well when they open due to provider/providers cover both clinics. Pt advised he will need to continue to come to ED for assessment for HD needs. Pt currently does not have an accepting nephrologist or clinic. Pt has been denied by all local clinics and/or providers.   Randine Mungo Dialysis Navigator [phone]

## 2023-12-22 ENCOUNTER — Other Ambulatory Visit: Payer: Self-pay

## 2023-12-22 ENCOUNTER — Emergency Department (HOSPITAL_COMMUNITY)
Admission: EM | Admit: 2023-12-22 | Discharge: 2023-12-22 | Discharge status: Against Medical Advice | Attending: Emergency Medicine | Admitting: Emergency Medicine

## 2023-12-22 ENCOUNTER — Encounter (HOSPITAL_COMMUNITY): Payer: Self-pay | Admitting: *Deleted

## 2023-12-22 DIAGNOSIS — N186 End stage renal disease: Secondary | ICD-10-CM | POA: Insufficient documentation

## 2023-12-22 DIAGNOSIS — D631 Anemia in chronic kidney disease: Secondary | ICD-10-CM | POA: Diagnosis not present

## 2023-12-22 DIAGNOSIS — Z5329 Procedure and treatment not carried out because of patient's decision for other reasons: Secondary | ICD-10-CM | POA: Insufficient documentation

## 2023-12-22 DIAGNOSIS — Z992 Dependence on renal dialysis: Secondary | ICD-10-CM | POA: Insufficient documentation

## 2023-12-22 DIAGNOSIS — I120 Hypertensive chronic kidney disease with stage 5 chronic kidney disease or end stage renal disease: Secondary | ICD-10-CM | POA: Diagnosis not present

## 2023-12-22 LAB — BASIC METABOLIC PANEL WITH GFR
Anion gap: 19 — ABNORMAL HIGH (ref 5–15)
BUN: 84 mg/dL — ABNORMAL HIGH (ref 6–20)
CO2: 19 mmol/L — ABNORMAL LOW (ref 22–32)
Calcium: 7.4 mg/dL — ABNORMAL LOW (ref 8.9–10.3)
Chloride: 100 mmol/L (ref 98–111)
Creatinine, Ser: 14.12 mg/dL — ABNORMAL HIGH (ref 0.61–1.24)
GFR, Estimated: 4 mL/min — ABNORMAL LOW (ref >60)
Glucose, Bld: 96 mg/dL (ref 70–99)
Potassium: 4.7 mmol/L (ref 3.5–5.1)
Sodium: 138 mmol/L (ref 135–145)

## 2023-12-22 MED ORDER — LIDOCAINE-PRILOCAINE 2.5-2.5 % EX CREA
1.0000 | TOPICAL_CREAM | CUTANEOUS | Status: DC | PRN
Start: 2023-12-22 — End: 2023-12-22

## 2023-12-22 MED ORDER — NEPRO/CARBSTEADY PO LIQD: 237.0000 mL | ORAL | Status: DC | PRN

## 2023-12-22 MED ORDER — CHLORHEXIDINE GLUCONATE CLOTH 2 % EX PADS: 6.0000 | MEDICATED_PAD | Freq: Every day | CUTANEOUS | Status: DC

## 2023-12-22 MED ORDER — ALTEPLASE 2 MG IJ SOLR: 2.0000 mg | Freq: Once | INTRAMUSCULAR | Status: DC | PRN

## 2023-12-22 MED ORDER — HEPARIN SODIUM (PORCINE) 1000 UNIT/ML IJ SOLN
INTRAMUSCULAR | Status: AC
  Filled 2023-12-22: qty 3

## 2023-12-22 MED ORDER — DARBEPOETIN ALFA 150 MCG/0.3ML IJ SOSY
150.0000 ug | PREFILLED_SYRINGE | Freq: Once | INTRAMUSCULAR | Status: AC
  Administered 2023-12-22: 150 ug via SUBCUTANEOUS
  Filled 2023-12-22: qty 0.3

## 2023-12-22 MED ORDER — HEPARIN SODIUM (PORCINE) 1000 UNIT/ML DIALYSIS
2400.0000 [IU] | Freq: Once | INTRAMUSCULAR | Status: AC
Start: 2023-12-22 — End: 2023-12-22
  Administered 2023-12-22: 2400 [IU] via INTRAVENOUS_CENTRAL

## 2023-12-22 MED ORDER — LIDOCAINE HCL (PF) 1 % IJ SOLN: 5.0000 mL | INTRAMUSCULAR | Status: DC | PRN

## 2023-12-22 MED ORDER — PENTAFLUOROPROP-TETRAFLUOROETH EX AERO
1.0000 | INHALATION_SPRAY | CUTANEOUS | Status: DC | PRN
Start: 2023-12-22 — End: 2023-12-22

## 2023-12-22 MED ORDER — HEPARIN SODIUM (PORCINE) 1000 UNIT/ML DIALYSIS: 1000.0000 [IU] | INTRAMUSCULAR | Status: DC | PRN

## 2023-12-22 NOTE — Procedures (Signed)
 Asked to see this patient for hospital dialysis. Pt was discharged from his outpatient unit for behavior issues. The plan will be for ED HD. Pt is not to be admitted at this time. Pt will go to the dialysis unit when they are ready for the patient. When dialysis is completed pt will be sent back to ED for reassessment.   Last OP HD unit info -->  from 07/2023 --> 4h  B400 76.5kg  2K bath RUA AVF Heparin  2400    Clinical issues other than volume: 1) Anemia of esrd:  - pt dose not have a nephrologist or HD unit so does not have access to ESA or IV iron  other than when here for hospital HD sessions - last tsat 24% and ferritin 1828 on 09/05/23-> ferritin too high for IV Fe - recent Hb in the 7.9- 10  range - last 3 doses of darbepoeitin here -->  - 200 mcg 10/17/23 - 100 mcg 11/18/23  - 150 mcg 12/15/23   PLAN: --> give darbe 150 mcg today if possible   I was present at the procedure, reviewed the HD regimen and made appropriate changes.   Myer Fret MD  [facility] 12/22/2023, 3:03 PM

## 2023-12-22 NOTE — ED Provider Notes (Signed)
 [place] EMERGENCY DEPARTMENT AT [facility] Provider Note   CSN: [id number] Arrival date & time: 12/22/23  9352     Patient presents with: Vascular Access Problem   Paul [name] is a 58 y.o. male.  {Add pertinent medical, surgical, social history, OB history to YEP:67052} Patient with history of high blood pressure, dialysis last dialyzed Friday in the emergency room presents requiring dialysis.  Patient has no shortness of breath no chest pain no abdominal pain no leg edema.  Patient does not currently have a dialysis center.  No fevers or infectious symptoms.  Vascular access right upper extremity.  The history is provided by the patient.       Prior to Admission medications   Medication Sig Start Date End Date Taking? Authorizing Provider  ARIPiprazole  (ABILIFY ) 5 MG tablet Take 1 tablet (5 mg total) by mouth at bedtime. 10/28/23   Plovsky, Elna, MD  carvedilol  (COREG ) 6.25 MG tablet Take 1 tablet (6.25 mg total) by mouth 2 (two) times daily with a meal. 07/29/23 10/28/23  Tawkaliyar, Roya, DO  isosorbide  mononitrate (IMDUR ) 30 MG 24 hr tablet Take 1 tablet (30 mg total) by mouth daily. Patient not taking: Reported on 10/28/2023 10/25/23   [name], MD  rosuvastatin  (CRESTOR ) 5 MG tablet Take 1 tablet (5 mg total) by mouth daily. 03/24/23     sucroferric oxyhydroxide (VELPHORO ) 500 MG chewable tablet Chew 3 tablets (1,500 mg total) by mouth 3 (three) times daily with meals. Patient not taking: Reported on 10/28/2023 07/29/23   Tawkaliyar, Roya, DO    Allergies: Penicillin g    Review of Systems  Constitutional:  Negative for chills and fever.  HENT:  Negative for congestion.   Eyes:  Negative for visual disturbance.  Respiratory:  Negative for shortness of breath.   Cardiovascular:  Negative for chest pain.  Gastrointestinal:  Negative for abdominal pain and vomiting.  Genitourinary:  Negative for dysuria and flank pain.  Musculoskeletal:  Negative  for back pain, neck pain and neck stiffness.  Skin:  Negative for rash.  Neurological:  Negative for light-headedness and headaches.    Updated Vital Signs BP (!) 158/98 (BP Location: Left Arm)   Pulse 81   Temp (!) 97.5 F (36.4 C) (Oral)   Resp 18   Ht 5' 3 (1.6 m)   Wt 81 kg   SpO2 98%   BMI 31.63 kg/m   Physical Exam Vitals and nursing note reviewed.  Constitutional:      General: He is not in acute distress.    Appearance: He is well-developed.  HENT:     Head: Normocephalic and atraumatic.     Mouth/Throat:     Mouth: Mucous membranes are moist.  Eyes:     General:        Right eye: No discharge.        Left eye: No discharge.     Conjunctiva/sclera: Conjunctivae normal.  Neck:     Trachea: No tracheal deviation.  Cardiovascular:     Rate and Rhythm: Normal rate.  Pulmonary:     Effort: Pulmonary effort is normal.  Abdominal:     General: There is no distension.     Palpations: Abdomen is soft.     Tenderness: There is no abdominal tenderness. There is no guarding.  Musculoskeletal:     Cervical back: Normal range of motion and neck supple. No rigidity.  Skin:    General: Skin is warm.  Capillary Refill: Capillary refill takes less than 2 seconds.     Findings: No rash.  Neurological:     General: No focal deficit present.     Mental Status: He is alert.  Psychiatric:        Mood and Affect: Mood normal.     (all labs ordered are listed, but only abnormal results are displayed) Labs Reviewed  BASIC METABOLIC PANEL WITH GFR    EKG: None  Radiology: No results found.  {Document cardiac monitor, telemetry assessment procedure when appropriate:32947} Procedures   Medications Ordered in the ED  Chlorhexidine  Gluconate Cloth 2 % PADS 6 each (has no administration in time range)      {Click here for ABCD2, HEART and other calculators REFRESH Note before signing:1}                              Medical Decision Making Amount and/or  Complexity of Data Reviewed Labs: ordered.   Patient with history of end-stage renal disease on dialysis presents needing dialysis.  Patient has no signs or symptoms concerning for fluid overload or infection at this time.  BMP ordered and discussed with on-call nephrology who will arrange dialysis from the emergency room.  Patient comfortable plan.  {Document critical care time when appropriate  Document review of labs and clinical decision tools ie CHADS2VASC2, etc  Document your independent review of radiology images and any outside records  Document your discussion with family members, caretakers and with consultants  Document social determinants of health affecting pt's care  Document your decision making why or why not admission, treatments were needed:32947:::1}   Final diagnoses:  Encounter for dialysis [facility])    ED Discharge Orders     None

## 2023-12-22 NOTE — Discharge Instructions (Signed)
 Follow-up with primary doctor and continue to work towards having a dialysis center to utilize.

## 2023-12-22 NOTE — ED Notes (Signed)
Report called to HD  

## 2023-12-22 NOTE — Progress Notes (Signed)
 Received patient in bed. Alert and oriented x 4.He signed his consent for hd treatment.  Access used: Right avf that worked well.  Duration of treatment : 3 hours.  UF goal 2.5 liters.  Medicine given: Heparin  2400 units.                            Aranesp  150mg   Hand off to the patient's nurse,back to the ed for discharge with stable condition via transporter.

## 2023-12-22 NOTE — ED Notes (Signed)
 Attempted to call dialysis unit back for requested report. No answer at this time.

## 2023-12-22 NOTE — ED Triage Notes (Signed)
Patient is here for his routine dialysis, no other complaints.

## 2023-12-22 NOTE — ED Notes (Signed)
 Pt is here requesting dialysis, he has no complaints today.

## 2023-12-26 ENCOUNTER — Other Ambulatory Visit: Payer: Self-pay

## 2023-12-26 ENCOUNTER — Emergency Department (HOSPITAL_COMMUNITY)
Admission: EM | Admit: 2023-12-26 | Discharge: 2023-12-26 | Discharge status: Against Medical Advice | Attending: Emergency Medicine | Admitting: Emergency Medicine

## 2023-12-26 DIAGNOSIS — I120 Hypertensive chronic kidney disease with stage 5 chronic kidney disease or end stage renal disease: Secondary | ICD-10-CM | POA: Diagnosis not present

## 2023-12-26 DIAGNOSIS — N186 End stage renal disease: Secondary | ICD-10-CM | POA: Diagnosis not present

## 2023-12-26 DIAGNOSIS — Z4902 Encounter for fitting and adjustment of peritoneal dialysis catheter: Secondary | ICD-10-CM | POA: Diagnosis present

## 2023-12-26 DIAGNOSIS — Z992 Dependence on renal dialysis: Secondary | ICD-10-CM | POA: Diagnosis not present

## 2023-12-26 DIAGNOSIS — Z5321 Procedure and treatment not carried out due to patient leaving prior to being seen by health care provider: Secondary | ICD-10-CM | POA: Insufficient documentation

## 2023-12-26 LAB — I-STAT CHEM 8, ED
BUN: 99 mg/dL — ABNORMAL HIGH (ref 6–20)
Calcium, Ion: 0.9 mmol/L — ABNORMAL LOW (ref 1.15–1.40)
Chloride: 104 mmol/L (ref 98–111)
Creatinine, Ser: 16.3 mg/dL — ABNORMAL HIGH (ref 0.61–1.24)
Glucose, Bld: 95 mg/dL (ref 70–99)
HCT: 26 % — ABNORMAL LOW (ref 39.0–52.0)
Hemoglobin: 8.8 g/dL — ABNORMAL LOW (ref 13.0–17.0)
Potassium: 4.8 mmol/L (ref 3.5–5.1)
Sodium: 141 mmol/L (ref 135–145)
TCO2: 21 mmol/L — ABNORMAL LOW (ref 22–32)

## 2023-12-26 MED ORDER — HEPARIN SODIUM (PORCINE) 1000 UNIT/ML DIALYSIS: 1000.0000 [IU] | INTRAMUSCULAR | Status: DC | PRN

## 2023-12-26 MED ORDER — ANTICOAGULANT SODIUM CITRATE 4% (200MG/5ML) IV SOLN
5.0000 mL | Status: DC | PRN
Start: 2023-12-26 — End: 2023-12-26
  Filled 2023-12-26: qty 5

## 2023-12-26 MED ORDER — HEPARIN SODIUM (PORCINE) 1000 UNIT/ML DIALYSIS
2400.0000 [IU] | Freq: Once | INTRAMUSCULAR | Status: AC
  Administered 2023-12-26: 2400 [IU] via INTRAVENOUS_CENTRAL

## 2023-12-26 MED ORDER — LIDOCAINE HCL (PF) 1 % IJ SOLN: 5.0000 mL | INTRAMUSCULAR | Status: DC | PRN

## 2023-12-26 MED ORDER — HEPARIN SODIUM (PORCINE) 1000 UNIT/ML IJ SOLN
INTRAMUSCULAR | Status: AC
  Filled 2023-12-26: qty 3

## 2023-12-26 MED ORDER — PENTAFLUOROPROP-TETRAFLUOROETH EX AERO
1.0000 | INHALATION_SPRAY | CUTANEOUS | Status: DC | PRN
Start: 2023-12-26 — End: 2023-12-26

## 2023-12-26 MED ORDER — ALTEPLASE 2 MG IJ SOLR: 2.0000 mg | Freq: Once | INTRAMUSCULAR | Status: DC | PRN

## 2023-12-26 MED ORDER — CHLORHEXIDINE GLUCONATE CLOTH 2 % EX PADS: 6.0000 | MEDICATED_PAD | Freq: Every day | CUTANEOUS | Status: DC

## 2023-12-26 MED ORDER — NEPRO/CARBSTEADY PO LIQD
237.0000 mL | ORAL | Status: DC | PRN
Start: 2023-12-26 — End: 2023-12-26
  Filled 2023-12-26: qty 237

## 2023-12-26 MED ORDER — LIDOCAINE-PRILOCAINE 2.5-2.5 % EX CREA: 1.0000 | TOPICAL_CREAM | CUTANEOUS | Status: DC | PRN

## 2023-12-26 NOTE — ED Notes (Addendum)
 Pt states he does not want to wait to be seen by physician or for discharge paperwork. Pt educated on benefits of staying and risks of leaving AMA. Pt signed AMA form. Pt A&Ox4 and ambulatory with steady gait.

## 2023-12-26 NOTE — ED Notes (Addendum)
 Pt back from dialysis and requesting to leave. MD Randol informed.

## 2023-12-26 NOTE — Procedures (Signed)
 Asked to see this patient for hospital dialysis. Pt was discharged from his outpatient unit for behavior issues. The plan will be for ED HD. Pt is not to be admitted at this time. Pt will go to the dialysis unit when they are ready for the patient. When dialysis is completed pt will be sent back to ED for reassessment.   Vitals:   12/26/23 0930 12/26/23 1049 12/26/23 1415 12/26/23 1423  BP: (!) 170/103  (!) 174/102   Pulse: 75  71   Resp: 16  15   Temp:  97.7 F (36.5 C) 97.7 F (36.5 C)   TempSrc:  Oral Oral   SpO2: 100%  100%   Weight:    81.6 kg  Height:        Last OP HD unit info -->  from 07/2023 --> 4h  B400 76.5kg  2K bath RUA AVF Heparin  2400    Clinical issues other than volume: 1) Anemia of esrd:  - pt dose not have a nephrologist or HD unit so does not have access to ESA or IV iron  other than when here for hospital HD sessions - last tsat 24% and ferritin 1828 on 09/05/23-> ferritin too high for IV Fe - recent Hb in the 8-10  range - last 3 doses of darbepoeitin here -->  - 100 mcg 11/18/23  - 150 mcg 12/15/23 - 150 mcg 12/22/23   PLAN: --> give darbe 150 mcg on Sat or early next week    I was present at the procedure, reviewed the HD regimen and made appropriate changes.   Myer Fret MD  [facility] 12/26/2023, 2:25 PM    Recent Labs  Lab 12/22/23 0754 12/26/23 0804  HGB  --  8.8*  CALCIUM  7.4*  --   CREATININE 14.12* 16.30*  K 4.7 4.8   No results for input(s): IRON , TIBC, FERRITIN in the last 168 hours. Inpatient medications:  Chlorhexidine  Gluconate Cloth  6 each Topical Q0600    anticoagulant sodium citrate      alteplase , anticoagulant sodium citrate , feeding supplement (NEPRO CARB STEADY), heparin , lidocaine  (PF), lidocaine -prilocaine , pentafluoroprop-tetrafluoroeth

## 2023-12-26 NOTE — Progress Notes (Signed)
 Received patient in bed to unit.  Alert and oriented.  Informed consent signed and in chart.   TX duration:3 hours  Patient tolerated well.  Transported back to the room  Alert, without acute distress.  Hand-off given to patient's nurse.   Access used: Right Fistula upper arm Access issues: none  Total UF removed: 2.5L Medication(s) given: none   12/26/23 1729  Vitals  Temp 97.9 F (36.6 C)  Temp Source Oral  BP (!) 162/98  MAP (mmHg) 117  Pulse Rate 76  ECG Heart Rate 77  Resp 16  Oxygen Therapy  SpO2 98 %  O2 Device Room Air  During Treatment Monitoring  Duration of HD Treatment -hour(s) 3 hour(s)  HD Safety Checks Performed Yes  Intra-Hemodialysis Comments Tx completed  Post Treatment  Dialyzer Clearance Clear  Liters Processed 72  Fluid Removed (mL) 2500 mL  Tolerated HD Treatment Yes  AVG/AVF Arterial Site Held (minutes) 7 minutes  AVG/AVF Venous Site Held (minutes) 7 minutes  Fistula / Graft Right Upper arm Arteriovenous fistula  No placement date or time found.   Placed prior to admission: Yes  Orientation: Right  Access Location: Upper arm  Access Type: Arteriovenous fistula  Status Deaccessed     Camellia Brasil LPN Kidney Dialysis Unit

## 2023-12-26 NOTE — ED Provider Notes (Signed)
 [place] EMERGENCY DEPARTMENT AT [facility] Provider Note   CSN: [id number] Arrival date & time: 12/26/23  9350     Patient presents with: dialysis   Paul [name] is a 58 y.o. male.   HPI 58 year old male history of end-stage renal disease on chronic dialysis presents today for dialysis.  He he has missed 1 session.  He has been coming to the ED after being dismissed from his dialysis center.  He currently denies dyspnea, swelling, fever, chills.    Prior to Admission medications   Medication Sig Start Date End Date Taking? Authorizing Provider  ARIPiprazole  (ABILIFY ) 5 MG tablet Take 1 tablet (5 mg total) by mouth at bedtime. 10/28/23   Plovsky, Elna, MD  carvedilol  (COREG ) 6.25 MG tablet Take 1 tablet (6.25 mg total) by mouth 2 (two) times daily with a meal. 07/29/23 10/28/23  Tawkaliyar, Roya, DO  isosorbide  mononitrate (IMDUR ) 30 MG 24 hr tablet Take 1 tablet (30 mg total) by mouth daily. Patient not taking: Reported on 10/28/2023 10/25/23   [name], MD  rosuvastatin  (CRESTOR ) 5 MG tablet Take 1 tablet (5 mg total) by mouth daily. 03/24/23     sucroferric oxyhydroxide (VELPHORO ) 500 MG chewable tablet Chew 3 tablets (1,500 mg total) by mouth 3 (three) times daily with meals. Patient not taking: Reported on 10/28/2023 07/29/23   Tawkaliyar, Roya, DO    Allergies: Penicillin g    Review of Systems  Updated Vital Signs BP (!) 174/96   Pulse 75   Temp 97.9 F (36.6 C) (Oral)   Resp 16   Ht 1.6 m (5' 3)   Wt 82.3 kg Comment: standing  SpO2 100%   BMI 32.14 kg/m   Physical Exam Vitals and nursing note reviewed.  Constitutional:      Appearance: He is well-developed.  HENT:     Head: Normocephalic and atraumatic.     Right Ear: External ear normal.     Left Ear: External ear normal.     Nose: Nose normal.  Eyes:     Extraocular Movements: Extraocular movements intact.  Neck:     Trachea: No tracheal deviation.  Pulmonary:     Effort:  Pulmonary effort is normal.  Musculoskeletal:        General: Normal range of motion.  Skin:    General: Skin is warm and dry.  Neurological:     Mental Status: He is alert and oriented to person, place, and time.  Psychiatric:        Mood and Affect: Mood normal.        Behavior: Behavior normal.     (all labs ordered are listed, but only abnormal results are displayed) Labs Reviewed  I-STAT CHEM 8, ED - Abnormal; Notable for the following components:      Result Value   BUN 99 (*)    Creatinine, Ser 16.30 (*)    Calcium , Ion 0.90 (*)    TCO2 21 (*)    Hemoglobin 8.8 (*)    HCT 26.0 (*)    All other components within normal limits    EKG: None  Radiology: No results found.   Procedures   Medications Ordered in the ED  heparin  injection 2,400 Units (2,400 Units Dialysis Given 12/26/23 1420)    Clinical Course as of 12/29/23 0657  Fri Dec 26, 2023  0826 I-STAT reviewed interpreted significant for elevated BUN and creatinine with normal potassium and stable anemia [DR]    Clinical Course User  Index [DR] Levander Houston, MD                                 Medical Decision Making  Care discussed with nephrology.  They will dialyze patient today.     Final diagnoses:  Dialysis patient [facility])    ED Discharge Orders     None          Levander Houston, MD 12/29/23 [phone]

## 2023-12-26 NOTE — ED Notes (Signed)
 Extra LG, Lav & 2SST drawn

## 2023-12-26 NOTE — ED Triage Notes (Signed)
 Pt here for routine dialysis no other complaints. Last dialysis was monday

## 2023-12-29 ENCOUNTER — Encounter (HOSPITAL_COMMUNITY): Payer: Self-pay | Admitting: *Deleted

## 2023-12-29 ENCOUNTER — Other Ambulatory Visit: Payer: Self-pay

## 2023-12-29 ENCOUNTER — Emergency Department (HOSPITAL_COMMUNITY)
Admission: EM | Admit: 2023-12-29 | Discharge: 2023-12-29 | Discharge status: Against Medical Advice | Attending: Emergency Medicine | Admitting: Emergency Medicine

## 2023-12-29 DIAGNOSIS — I120 Hypertensive chronic kidney disease with stage 5 chronic kidney disease or end stage renal disease: Secondary | ICD-10-CM | POA: Diagnosis not present

## 2023-12-29 DIAGNOSIS — N186 End stage renal disease: Secondary | ICD-10-CM | POA: Diagnosis not present

## 2023-12-29 DIAGNOSIS — Z992 Dependence on renal dialysis: Secondary | ICD-10-CM | POA: Insufficient documentation

## 2023-12-29 DIAGNOSIS — D631 Anemia in chronic kidney disease: Secondary | ICD-10-CM | POA: Diagnosis not present

## 2023-12-29 LAB — HEPATITIS B SURFACE ANTIGEN: Hepatitis B Surface Ag: NONREACTIVE

## 2023-12-29 MED ORDER — CHLORHEXIDINE GLUCONATE CLOTH 2 % EX PADS: 6.0000 | MEDICATED_PAD | Freq: Every day | CUTANEOUS | Status: DC

## 2023-12-29 MED ORDER — DARBEPOETIN ALFA 150 MCG/0.3ML IJ SOSY
150.0000 ug | PREFILLED_SYRINGE | Freq: Once | INTRAMUSCULAR | Status: AC
  Administered 2023-12-29: 150 ug via SUBCUTANEOUS
  Filled 2023-12-29: qty 0.3

## 2023-12-29 NOTE — Procedures (Signed)
 Last OP HD unit info -->  from 07/2023 --> 4h  B400 76.5kg  2K bath RUA AVF (+bruit)  Heparin  2400  Clinical issues other than volume: 1) Anemia of esrd:  - pt does not have a nephrologist or HD unit so does not have access to ESA or IV iron  other than when here for hospital HD sessions - last tsat 24% and ferritin 1828 on 09/05/23-> ferritin too high for IV Fe - recent Hb in the 8-10  range - last 3 doses of darbepoeitin here -->  - 100 mcg 11/18/23  - 150 mcg 12/15/23 - 150 mcg 12/22/23   PLAN: --> give darbe 150 mcg today; I confirmed with the ED pharmacist who checked the chart and the last dose was given on 8/18.    Asked to see this patient for hospital dialysis. Pt was discharged from his outpatient unit for behavior issues. The plan will be for ED HD. Pt is not to be admitted at this time. Pt will go to the dialysis unit when they are ready for the patient; 2nd shift. When dialysis is completed pt will be sent back to ED for reassessment.   Vitals:   12/29/23 0647 12/29/23 0653  BP: (!) 181/99   Pulse: 85   Resp: 16   Temp: 97.7 F (36.5 C)   TempSrc: Oral   SpO2: 99%   Weight:  82 kg  Height:  5' 3 (1.6 m)      Recent Labs  Lab 12/26/23 0804  HGB 8.8*  CREATININE 16.30*  K 4.8   No results for input(s): IRON , TIBC, FERRITIN in the last 168 hours. Inpatient medications:  Chlorhexidine  Gluconate Cloth  6 each Topical Q0600   darbepoetin (ARANESP ) injection - DIALYSIS  150 mcg Subcutaneous Once

## 2023-12-29 NOTE — ED Provider Notes (Signed)
 [place] EMERGENCY DEPARTMENT AT [facility] Provider Note   CSN: [id number] Arrival date & time: 12/29/23  9356     Patient presents with: needs dialysis   Paul [name] is a 58 y.o. male who presents today to the ED with primary concerns of regular dialysis treatment.  He is asymptomatic at present, presenting for his dialysis secondary to ESRD, on Monday-Wednesday-Friday schedule.   HPI     Prior to Admission medications   Medication Sig Start Date End Date Taking? Authorizing Provider  ARIPiprazole  (ABILIFY ) 5 MG tablet Take 1 tablet (5 mg total) by mouth at bedtime. 10/28/23   Plovsky, Elna, MD  carvedilol  (COREG ) 6.25 MG tablet Take 1 tablet (6.25 mg total) by mouth 2 (two) times daily with a meal. 07/29/23 10/28/23  Tawkaliyar, Roya, DO  isosorbide  mononitrate (IMDUR ) 30 MG 24 hr tablet Take 1 tablet (30 mg total) by mouth daily. Patient not taking: Reported on 10/28/2023 10/25/23   [name], MD  rosuvastatin  (CRESTOR ) 5 MG tablet Take 1 tablet (5 mg total) by mouth daily. 03/24/23     sucroferric oxyhydroxide (VELPHORO ) 500 MG chewable tablet Chew 3 tablets (1,500 mg total) by mouth 3 (three) times daily with meals. Patient not taking: Reported on 10/28/2023 07/29/23   Tawkaliyar, Roya, DO    Allergies: Penicillin g    Review of Systems  Constitutional:  Negative for chills and fever.  HENT:  Negative for sore throat.   Eyes:  Negative for visual disturbance.  Respiratory:  Negative for cough and shortness of breath.   Cardiovascular:  Negative for chest pain.  Gastrointestinal:  Negative for abdominal pain, diarrhea, nausea and vomiting.  Genitourinary:  Negative for dysuria and frequency.  Musculoskeletal:  Negative for back pain and neck pain.  Skin:  Negative for rash.  Neurological:  Negative for weakness, numbness and headaches.  Hematological:  Negative for adenopathy.  Psychiatric/Behavioral:  Negative for behavioral problems.   All  other systems reviewed and are negative.   Updated Vital Signs BP (!) 181/99 (BP Location: Left Arm)   Pulse 85   Temp 97.7 F (36.5 C) (Oral)   Resp 16   Ht 5' 3 (1.6 m)   Wt 82 kg   SpO2 99%   BMI 32.02 kg/m   Physical Exam Vitals and nursing note reviewed.  HENT:     Head: Normocephalic and atraumatic.  Eyes:     General: No scleral icterus.    Conjunctiva/sclera: Conjunctivae normal.  Pulmonary:     Effort: Pulmonary effort is normal.  Abdominal:     General: Abdomen is flat.  Musculoskeletal:     Cervical back: Normal range of motion and neck supple.  Skin:    Findings: No rash.  Neurological:     Mental Status: He is alert.  Psychiatric:        Mood and Affect: Mood normal.     (all labs ordered are listed, but only abnormal results are displayed) Labs Reviewed - No data to display  EKG: None  Radiology: No results found.   Procedures   Medications Ordered in the ED - No data to display                                  Medical Decision Making  Patient presents for regular dialysis session, nephrology consulted for dialysis admission.  Patient taken for dialysis treatment, anticipate return to ED  for discharge.  Notification given to oncoming provider.     Final diagnoses:  None    ED Discharge Orders     None          Myriam Dorn BROCKS, [place] 12/29/23 1544    Levander Houston, MD 01/01/24 1106

## 2023-12-29 NOTE — ED Triage Notes (Signed)
 Patient is here for dialysis no other complaints

## 2023-12-29 NOTE — Progress Notes (Signed)
   12/29/23 1609  Vitals  Temp 97.7 F (36.5 C)  Pulse Rate 95  Resp 17  BP (!) 163/92  SpO2 99 %  Post Treatment  Dialyzer Clearance Clear  Hemodialysis Intake (mL) 0 mL  Liters Processed 86.7  Fluid Removed (mL) 2500 mL  Tolerated HD Treatment Yes  AVG/AVF Arterial Site Held (minutes) 10 minutes  AVG/AVF Venous Site Held (minutes) 10 minutes   Received patient in bed to unit.  Alert and oriented.  Informed consent signed and in chart.   TX duration:3.75HRS  Patient tolerated well.  Transported back to the room  Alert, without acute distress.  Hand-off given to patient's nurse.   Access used: RAVF Access issues: NONE  Total UF removed: 2.5L Medication(s) given: ARANESP    Na'Shaminy T Miroslav Gin [facility]

## 2023-12-31 ENCOUNTER — Encounter (HOSPITAL_COMMUNITY): Payer: Self-pay | Admitting: Emergency Medicine

## 2023-12-31 ENCOUNTER — Emergency Department (HOSPITAL_COMMUNITY): Admission: EM | Admit: 2023-12-31 | Discharge: 2024-01-01 | Discharge status: Against Medical Advice

## 2023-12-31 ENCOUNTER — Other Ambulatory Visit: Payer: Self-pay

## 2023-12-31 DIAGNOSIS — I509 Heart failure, unspecified: Secondary | ICD-10-CM | POA: Insufficient documentation

## 2023-12-31 DIAGNOSIS — I132 Hypertensive heart and chronic kidney disease with heart failure and with stage 5 chronic kidney disease, or end stage renal disease: Secondary | ICD-10-CM | POA: Insufficient documentation

## 2023-12-31 DIAGNOSIS — N186 End stage renal disease: Secondary | ICD-10-CM | POA: Diagnosis not present

## 2023-12-31 DIAGNOSIS — Z992 Dependence on renal dialysis: Secondary | ICD-10-CM | POA: Insufficient documentation

## 2023-12-31 DIAGNOSIS — Z79899 Other long term (current) drug therapy: Secondary | ICD-10-CM | POA: Insufficient documentation

## 2023-12-31 DIAGNOSIS — I120 Hypertensive chronic kidney disease with stage 5 chronic kidney disease or end stage renal disease: Secondary | ICD-10-CM | POA: Diagnosis not present

## 2023-12-31 LAB — CBC WITH DIFFERENTIAL/PLATELET
Abs Immature Granulocytes: 0.03 K/uL (ref 0.00–0.07)
Basophils Absolute: 0.1 K/uL (ref 0.0–0.1)
Basophils Relative: 1 %
Eosinophils Absolute: 0.2 K/uL (ref 0.0–0.5)
Eosinophils Relative: 3 %
HCT: 29.4 % — ABNORMAL LOW (ref 39.0–52.0)
Hemoglobin: 10.1 g/dL — ABNORMAL LOW (ref 13.0–17.0)
Immature Granulocytes: 1 %
Lymphocytes Relative: 19 %
Lymphs Abs: 1.3 K/uL (ref 0.7–4.0)
MCH: 31.2 pg (ref 26.0–34.0)
MCHC: 34.4 g/dL (ref 30.0–36.0)
MCV: 90.7 fL (ref 80.0–100.0)
Monocytes Absolute: 0.9 K/uL (ref 0.1–1.0)
Monocytes Relative: 14 %
Neutro Abs: 4.1 K/uL (ref 1.7–7.7)
Neutrophils Relative %: 62 %
Platelets: 243 K/uL (ref 150–400)
RBC: 3.24 MIL/uL — ABNORMAL LOW (ref 4.22–5.81)
RDW: 16.6 % — ABNORMAL HIGH (ref 11.5–15.5)
WBC: 6.5 K/uL (ref 4.0–10.5)
nRBC: 0 % (ref 0.0–0.2)

## 2023-12-31 LAB — BASIC METABOLIC PANEL WITH GFR
Anion gap: 18 — ABNORMAL HIGH (ref 5–15)
BUN: 69 mg/dL — ABNORMAL HIGH (ref 6–20)
CO2: 19 mmol/L — ABNORMAL LOW (ref 22–32)
Calcium: 7.2 mg/dL — ABNORMAL LOW (ref 8.9–10.3)
Chloride: 95 mmol/L — ABNORMAL LOW (ref 98–111)
Creatinine, Ser: 11.63 mg/dL — ABNORMAL HIGH (ref 0.61–1.24)
GFR, Estimated: 5 mL/min — ABNORMAL LOW (ref >60)
Glucose, Bld: 89 mg/dL (ref 70–99)
Potassium: 3.7 mmol/L (ref 3.5–5.1)
Sodium: 132 mmol/L — ABNORMAL LOW (ref 135–145)

## 2023-12-31 MED ORDER — CHLORHEXIDINE GLUCONATE CLOTH 2 % EX PADS: 6.0000 | MEDICATED_PAD | Freq: Every day | CUTANEOUS | Status: DC

## 2023-12-31 NOTE — ED Triage Notes (Signed)
 Pt. Here for dialysis. Completed full treatment on Monday.

## 2023-12-31 NOTE — Progress Notes (Signed)
  Received patient in bed to unit.   Informed consent signed and in chart.    TX duration: 3.5     Transported by  Hand-off given to patient's nurse. Alan Rn   Access used: Fistula RFA Access issues: None   Total UF removed: 2500 Medication(s) given: none Post HD VS: stable Post HD weight: 81.6     Wisdom Rickey Renie LPN [facility]

## 2023-12-31 NOTE — ED Provider Notes (Signed)
 [place] EMERGENCY DEPARTMENT AT [facility] Provider Note   CSN: [id number] Arrival date & time: 12/31/23  9265     Patient presents with: Vascular Access Problem   Paul [name] is a 58 y.o. male.   58 year old male with a history of CKD, CHF, hypertension, schizophrenia, bipolar disorder presents to the emergency department for dialysis.  Last dialyzed in the hospital 2 days ago.  He has no acute complaints.  Denies chest pain, shortness of breath, abdominal pain, nausea, vomiting.  The history is provided by the patient. No language interpreter was used.       Prior to Admission medications   Medication Sig Start Date End Date Taking? Authorizing Provider  ARIPiprazole  (ABILIFY ) 5 MG tablet Take 1 tablet (5 mg total) by mouth at bedtime. 10/28/23   Plovsky, Elna, MD  carvedilol  (COREG ) 6.25 MG tablet Take 1 tablet (6.25 mg total) by mouth 2 (two) times daily with a meal. 07/29/23 10/28/23  Tawkaliyar, Roya, DO  isosorbide  mononitrate (IMDUR ) 30 MG 24 hr tablet Take 1 tablet (30 mg total) by mouth daily. Patient not taking: Reported on 10/28/2023 10/25/23   [name], MD  rosuvastatin  (CRESTOR ) 5 MG tablet Take 1 tablet (5 mg total) by mouth daily. 03/24/23     sucroferric oxyhydroxide (VELPHORO ) 500 MG chewable tablet Chew 3 tablets (1,500 mg total) by mouth 3 (three) times daily with meals. Patient not taking: Reported on 10/28/2023 07/29/23   Tawkaliyar, Roya, DO    Allergies: Penicillin g    Review of Systems Ten systems reviewed and are negative for acute change, except as noted in the HPI.    Updated Vital Signs BP (!) 175/99   Pulse (!) 17   Temp 98.7 F (37.1 C)   Resp 17   Ht 5' 3 (1.6 m)   Wt 84.2 kg   SpO2 99%   BMI 32.88 kg/m   Physical Exam Vitals and nursing note reviewed.  Constitutional:      General: He is not in acute distress.    Appearance: He is well-developed. He is not diaphoretic.     Comments: Nontoxic appearing  and in NAD  HENT:     Head: Normocephalic and atraumatic.  Eyes:     General: No scleral icterus.    Conjunctiva/sclera: Conjunctivae normal.  Pulmonary:     Effort: Pulmonary effort is normal. No respiratory distress.     Comments: Respirations even and unlabored Musculoskeletal:        General: Normal range of motion.     Cervical back: Normal range of motion.  Skin:    General: Skin is warm and dry.     Coloration: Skin is not pale.     Findings: No erythema or rash.  Neurological:     Mental Status: He is alert and oriented to person, place, and time.  Psychiatric:        Behavior: Behavior normal.     (all labs ordered are listed, but only abnormal results are displayed) Labs Reviewed  BASIC METABOLIC PANEL WITH GFR  CBC WITH DIFFERENTIAL/PLATELET    EKG: None  Radiology: No results found.   Procedures   Medications Ordered in the ED  Chlorhexidine  Gluconate Cloth 2 % PADS 6 each (has no administration in time range)  Chlorhexidine  Gluconate Cloth 2 % PADS 6 each (has no administration in time range)    Clinical Course as of 12/31/23 1235  Wed Dec 31, 2023  9241 Dr. Melia of  nephrology aware of patient's arrival to the ED. [KH]    Clinical Course User Index [KH] Keith Sor, PA-C                                 Medical Decision Making  This patient presents to the ED for concern of needing dialysis, this involves an extensive number of treatment options, and is a complaint that carries with it a high risk of complications and morbidity.  The differential diagnosis includes pulmonary edema vs electrolyte derangement vs sepsis.     Co morbidities that complicate the patient evaluation   HTN ESRD Schizophrenia     Additional history obtained:   External records from outside source obtained and reviewed including renal function panel from 12/29/23     Cardiac Monitoring:   The patient was maintained on a cardiac monitor.  I personally viewed and  interpreted the cardiac monitored which showed an underlying rhythm of: NSR     Medicines ordered and prescription drug management:   I have reviewed the patients home medicines and have made adjustments as needed     Test Considered:   BMP     Consultations Obtained:   I requested consultation with Dr. Melia of Nephrology - they will coordinate in hospital dialysis for patient.     Problem List / ED Course:   As above     Reevaluation:   After the interventions noted above, I reevaluated the patient and found that they have :stayed the same     Social Determinants of Health:   Lives independently     Dispostion:   Patient transferred to floor for HD.      Final diagnoses:  ESRD (end stage renal disease) on dialysis [facility])    ED Discharge Orders     None          Keith Sor, PA-C 12/31/23 1235    [name], DO 12/31/23 1512

## 2023-12-31 NOTE — Procedures (Signed)
 Last OP HD unit info -->  from 07/2023 --> 4h  B400 76.5kg  2K bath RUA AVF (+bruit)  Heparin  2400  Clinical issues other than volume: 1) Anemia of esrd:  - pt does not have a nephrologist or HD unit so does not have access to ESA or IV iron  other than when here for hospital HD sessions - last tsat 24% and ferritin 1828 on 09/05/23-> ferritin too high for IV Fe - recent Hb in the 8-10  range - last 3 doses of darbepoeitin here -->  - 100 mcg 11/18/23  - 150 mcg 12/15/23 - 150 mcg 12/22/23 - 150 mcg 12/29/23   PLAN: --> Will need to check if he is on a binder as the phosphorus was elevated.  Asked to see this patient for hospital dialysis. Pt was discharged from his outpatient unit for behavior issues. The plan will be for ED HD. Pt is not to be admitted at this time. Pt will go to the dialysis unit when they are ready for the patient; 2nd shift. When dialysis is completed pt will be sent back to ED for reassessment.   Vitals:   12/31/23 0738 12/31/23 0741  BP:  (!) 187/104  Pulse:  78  Resp:  16  Temp:  97.9 F (36.6 C)  TempSrc:  Oral  SpO2:  100%  Weight: 82 kg   Height: 5' 3 (1.6 m)       Recent Labs  Lab 12/26/23 0804  HGB 8.8*  CREATININE 16.30*  K 4.8   No results for input(s): IRON , TIBC, FERRITIN in the last 168 hours. Inpatient medications:  Chlorhexidine  Gluconate Cloth  6 each Topical Q0600

## 2023-12-31 NOTE — ED Notes (Signed)
 Awaiting to be taken to dialysis.  Care handoff given to hemodialysis per [facility] previous RN.

## 2023-12-31 NOTE — ED Triage Notes (Signed)
 Pt here for dialysis, denies any complaints.

## 2024-01-01 LAB — HEPATITIS B SURFACE ANTIBODY, QUANTITATIVE: Hep B S AB Quant (Post): 9410 m[IU]/mL

## 2024-01-02 ENCOUNTER — Emergency Department (HOSPITAL_COMMUNITY)
Admission: EM | Admit: 2024-01-02 | Discharge: 2024-01-02 | Disposition: A | Discharge status: Home / Self Care | Attending: Emergency Medicine | Admitting: Emergency Medicine

## 2024-01-02 ENCOUNTER — Other Ambulatory Visit: Payer: Self-pay

## 2024-01-02 ENCOUNTER — Encounter (HOSPITAL_COMMUNITY): Payer: Self-pay

## 2024-01-02 DIAGNOSIS — N186 End stage renal disease: Secondary | ICD-10-CM | POA: Diagnosis not present

## 2024-01-02 DIAGNOSIS — Z992 Dependence on renal dialysis: Secondary | ICD-10-CM | POA: Diagnosis not present

## 2024-01-02 DIAGNOSIS — I509 Heart failure, unspecified: Secondary | ICD-10-CM | POA: Diagnosis not present

## 2024-01-02 DIAGNOSIS — I120 Hypertensive chronic kidney disease with stage 5 chronic kidney disease or end stage renal disease: Secondary | ICD-10-CM | POA: Diagnosis not present

## 2024-01-02 LAB — COMPREHENSIVE METABOLIC PANEL WITH GFR
ALT: 13 U/L (ref 0–44)
AST: 12 U/L — ABNORMAL LOW (ref 15–41)
Albumin: 3.7 g/dL (ref 3.5–5.0)
Alkaline Phosphatase: 92 U/L (ref 38–126)
Anion gap: 22 — ABNORMAL HIGH (ref 5–15)
BUN: 61 mg/dL — ABNORMAL HIGH (ref 6–20)
CO2: 19 mmol/L — ABNORMAL LOW (ref 22–32)
Calcium: 7.6 mg/dL — ABNORMAL LOW (ref 8.9–10.3)
Chloride: 101 mmol/L (ref 98–111)
Creatinine, Ser: 10.85 mg/dL — ABNORMAL HIGH (ref 0.61–1.24)
GFR, Estimated: 5 mL/min — ABNORMAL LOW (ref >60)
Glucose, Bld: 89 mg/dL (ref 70–99)
Potassium: 4.3 mmol/L (ref 3.5–5.1)
Sodium: 142 mmol/L (ref 135–145)
Total Bilirubin: 0.8 mg/dL (ref 0.0–1.2)
Total Protein: 7.4 g/dL (ref 6.5–8.1)

## 2024-01-02 LAB — CBC WITH DIFFERENTIAL/PLATELET
Abs Immature Granulocytes: 0.02 K/uL (ref 0.00–0.07)
Basophils Absolute: 0 K/uL (ref 0.0–0.1)
Basophils Relative: 1 %
Eosinophils Absolute: 0.1 K/uL (ref 0.0–0.5)
Eosinophils Relative: 2 %
HCT: 32.3 % — ABNORMAL LOW (ref 39.0–52.0)
Hemoglobin: 10.6 g/dL — ABNORMAL LOW (ref 13.0–17.0)
Immature Granulocytes: 0 %
Lymphocytes Relative: 19 %
Lymphs Abs: 1.2 K/uL (ref 0.7–4.0)
MCH: 30.9 pg (ref 26.0–34.0)
MCHC: 32.8 g/dL (ref 30.0–36.0)
MCV: 94.2 fL (ref 80.0–100.0)
Monocytes Absolute: 0.8 K/uL (ref 0.1–1.0)
Monocytes Relative: 12 %
Neutro Abs: 4.2 K/uL (ref 1.7–7.7)
Neutrophils Relative %: 66 %
Platelets: 254 K/uL (ref 150–400)
RBC: 3.43 MIL/uL — ABNORMAL LOW (ref 4.22–5.81)
RDW: 16.9 % — ABNORMAL HIGH (ref 11.5–15.5)
WBC: 6.4 K/uL (ref 4.0–10.5)
nRBC: 0 % (ref 0.0–0.2)

## 2024-01-02 MED ORDER — HEPARIN SODIUM (PORCINE) 1000 UNIT/ML DIALYSIS
2400.0000 [IU] | Freq: Once | INTRAMUSCULAR | Status: AC
  Administered 2024-01-02: 2400 [IU] via INTRAVENOUS_CENTRAL
  Filled 2024-01-02: qty 3

## 2024-01-02 MED ORDER — CHLORHEXIDINE GLUCONATE CLOTH 2 % EX PADS: 6.0000 | MEDICATED_PAD | Freq: Every day | CUTANEOUS | Status: DC

## 2024-01-02 MED ORDER — PENTAFLUOROPROP-TETRAFLUOROETH EX AERO: 1.0000 | INHALATION_SPRAY | CUTANEOUS | Status: DC | PRN

## 2024-01-02 MED ORDER — ANTICOAGULANT SODIUM CITRATE 4% (200MG/5ML) IV SOLN: 5.0000 mL | Status: DC | PRN

## 2024-01-02 MED ORDER — LIDOCAINE HCL (PF) 1 % IJ SOLN: 5.0000 mL | INTRAMUSCULAR | Status: DC | PRN

## 2024-01-02 MED ORDER — HEPARIN SODIUM (PORCINE) 1000 UNIT/ML IJ SOLN
INTRAMUSCULAR | Status: AC
  Filled 2024-01-02: qty 3

## 2024-01-02 MED ORDER — ALTEPLASE 2 MG IJ SOLR: 2.0000 mg | Freq: Once | INTRAMUSCULAR | Status: DC | PRN

## 2024-01-02 MED ORDER — LIDOCAINE-PRILOCAINE 2.5-2.5 % EX CREA: 1.0000 | TOPICAL_CREAM | CUTANEOUS | Status: DC | PRN

## 2024-01-02 MED ORDER — HEPARIN SODIUM (PORCINE) 1000 UNIT/ML DIALYSIS: 1000.0000 [IU] | INTRAMUSCULAR | Status: DC | PRN

## 2024-01-02 NOTE — Progress Notes (Signed)
 Received patient in bed to unit.  Alert and oriented.  Informed consent signed and in chart.   TX duration: 3 hours and 45 minutes  Patient tolerated well.  Transported back to the room  Alert, without acute distress.  Hand-off given to patient's nurse.   Access used: Right Upper arm graft Access issues: none  Total UF removed: 2.5L Medication(s) given: none   01/02/24 1746  Vitals  Temp 97.7 F (36.5 C)  Temp Source Oral  BP (!) 178/96  MAP (mmHg) 120  Pulse Rate 81  ECG Heart Rate 81  Resp 20  Oxygen Therapy  SpO2 100 %  O2 Device Room Air  During Treatment Monitoring  Duration of HD Treatment -hour(s) 3.5 hour(s)  HD Safety Checks Performed Yes  Intra-Hemodialysis Comments Tx completed  Post Treatment  Dialyzer Clearance Clear  Liters Processed 86.7  Fluid Removed (mL) 2500 mL  Tolerated HD Treatment Yes  Post-Hemodialysis Comments tolerated well  AVG/AVF Arterial Site Held (minutes) 7 minutes  AVG/AVF Venous Site Held (minutes) 7 minutes  Fistula / Graft Right Upper arm Arteriovenous fistula  No placement date or time found.   Placed prior to admission: Yes  Orientation: Right  Access Location: Upper arm  Access Type: Arteriovenous fistula  Status Deaccessed     Camellia Brasil LPN [facility]

## 2024-01-02 NOTE — ED Provider Notes (Signed)
 [place] EMERGENCY DEPARTMENT AT [facility] Provider Note   CSN: [id number] Arrival date & time: 01/02/24  9353     Patient presents with: Needs Dialysis   Paul [name] is a 58 y.o. male.   58yo male presents for ER/hospital dialysis. M/W/F with right arm access. States he comes to the ER for his dialysis. Last full session was on Wednesday. He is feeling well today without complaints.        Prior to Admission medications   Medication Sig Start Date End Date Taking? Authorizing Provider  ARIPiprazole  (ABILIFY ) 5 MG tablet Take 1 tablet (5 mg total) by mouth at bedtime. 10/28/23   Plovsky, Elna, MD  carvedilol  (COREG ) 6.25 MG tablet Take 1 tablet (6.25 mg total) by mouth 2 (two) times daily with a meal. 07/29/23 10/28/23  Tawkaliyar, Roya, DO  isosorbide  mononitrate (IMDUR ) 30 MG 24 hr tablet Take 1 tablet (30 mg total) by mouth daily. Patient not taking: Reported on 10/28/2023 10/25/23   [name], MD  rosuvastatin  (CRESTOR ) 5 MG tablet Take 1 tablet (5 mg total) by mouth daily. 03/24/23     sucroferric oxyhydroxide (VELPHORO ) 500 MG chewable tablet Chew 3 tablets (1,500 mg total) by mouth 3 (three) times daily with meals. Patient not taking: Reported on 10/28/2023 07/29/23   Tawkaliyar, Roya, DO    Allergies: Penicillin g    Review of Systems Negative except as per HPI Updated Vital Signs BP (!) 164/95   Pulse 80   Temp 97.7 F (36.5 C) (Oral)   Resp 16   Ht 5' 3 (1.6 m)   Wt 81.6 kg   SpO2 100%   BMI 31.89 kg/m   Physical Exam Vitals and nursing note reviewed.  Constitutional:      General: He is not in acute distress.    Appearance: He is well-developed. He is not diaphoretic.  HENT:     Head: Normocephalic and atraumatic.  Cardiovascular:     Rate and Rhythm: Normal rate and regular rhythm.     Heart sounds: Normal heart sounds.  Pulmonary:     Effort: Pulmonary effort is normal.     Breath sounds: Normal breath sounds.  Skin:     General: Skin is warm and dry.     Findings: No erythema or rash.  Neurological:     Mental Status: He is alert and oriented to person, place, and time.  Psychiatric:        Behavior: Behavior normal.     (all labs ordered are listed, but only abnormal results are displayed) Labs Reviewed  COMPREHENSIVE METABOLIC PANEL WITH GFR - Abnormal; Notable for the following components:      Result Value   CO2 19 (*)    BUN 61 (*)    Creatinine, Ser 10.85 (*)    Calcium  7.6 (*)    AST 12 (*)    GFR, Estimated 5 (*)    Anion gap 22 (*)    All other components within normal limits  CBC WITH DIFFERENTIAL/PLATELET - Abnormal; Notable for the following components:   RBC 3.43 (*)    Hemoglobin 10.6 (*)    HCT 32.3 (*)    RDW 16.9 (*)    All other components within normal limits    EKG: EKG Interpretation Date/Time:  Friday January 02 2024 07:01:02 EDT Ventricular Rate:  78 PR Interval:  200 QRS Duration:  98 QT Interval:  448 QTC Calculation: 510 R Axis:   -54  Text Interpretation: Normal sinus rhythm Left axis deviation Prolonged QT PREVIOUS ECG IS PRESENT Confirmed by Bernard Drivers ([id number]) on 01/02/2024 7:49:26 AM  Radiology: No results found.   Procedures   Medications Ordered in the ED  Chlorhexidine  Gluconate Cloth 2 % PADS 6 each (has no administration in time range)  pentafluoroprop-tetrafluoroeth (GEBAUERS) aerosol 1 Application (has no administration in time range)  lidocaine  (PF) (XYLOCAINE ) 1 % injection 5 mL (has no administration in time range)  lidocaine -prilocaine  (EMLA ) cream 1 Application (has no administration in time range)  heparin  injection 1,000 Units (has no administration in time range)  anticoagulant sodium citrate  solution 5 mL (has no administration in time range)  alteplase  (CATHFLO ACTIVASE ) injection 2 mg (has no administration in time range)  heparin  injection 2,400 Units (has no administration in time range)                                     Medical Decision Making Amount and/or Complexity of Data Reviewed Labs: ordered.   This patient presents to the ED for concern of request for dialysis, this involves an extensive number of treatment options, and is a complaint that carries with it a high risk of complications and morbidity.  The differential diagnosis includes electrolyte abnormality    Co morbidities / Chronic conditions that complicate the patient evaluation  CHF, CKD ESRD, schizophrenia, HLD, additional history on file reviewed    Additional history obtained:  Additional history obtained from EMR External records from outside source obtained and reviewed including prior labs and records, last HD 12/31/23   Lab Tests:  I Ordered, and personally interpreted labs.  The pertinent results include:  CBC stable/improved hgb. CMP without significant changes compared to baseline, K WNL.    Cardiac Monitoring: / EKG:  The patient was maintained on a cardiac monitor.  I personally viewed and interpreted the cardiac monitored which showed an underlying rhythm of: NSR, rate 78   Problem List / ED Course / Critical interventions / Medication management  58 yo male presents for dialysis. Stable without complaints otherwise. Discussed with nephrology who will arrange for dialysis today.  I have reviewed the patients home medicines and have made adjustments as needed   Consultations Obtained:  I requested consultation with the nephrologist, Dr. Melia,  and discussed lab and imaging findings as well as pertinent plan - they recommend: Will arrange for dialysis   Social Determinants of Health:  Unable to receive care at outpatient dialysis center   Test / Admission - Considered:  HD, likely dc afterwards       Final diagnoses:  ESRD (end stage renal disease) on dialysis [facility])    ED Discharge Orders     None          Beverley Leita LABOR, PA-C 01/02/24 1302    Bernard Drivers, MD 01/03/24 [phone]

## 2024-01-02 NOTE — ED Triage Notes (Signed)
 Pt here for dialysis, completed full treatment Wednesday. Pt denies any other complaints.

## 2024-01-02 NOTE — Procedures (Signed)
 Last OP HD unit info -->  from 07/2023 --> 4h  B400 76.5kg  2K bath RUA AVF (+bruit)  Heparin  2400  Clinical issues other than volume: 1) Anemia of esrd:  - pt does not have a nephrologist or HD unit so does not have access to ESA or IV iron  other than when here for hospital HD sessions - last tsat 24% and ferritin 1828 on 09/05/23-> ferritin too high for IV Fe - recent Hb in the 8-10  range - last 3 doses of darbepoeitin here -->  - 100 mcg 11/18/23  - 150 mcg 12/15/23 - 150 mcg 12/22/23 - 150 mcg 12/29/23   PLAN: --> Will need to check if he is on a binder as the phosphorus was elevated.  Asked to see this patient for hospital dialysis. Pt was discharged from his outpatient unit for behavior issues. The plan will be for ED HD. Pt is not to be admitted at this time. Pt will go to the dialysis unit when they are ready for the patient; 2nd shift. When dialysis is completed pt will be sent back to ED for reassessment.   Vitals:   01/02/24 0653 01/02/24 0655 01/02/24 0656  BP:   (!) 164/95  Pulse:  80   Resp:  16   Temp:  97.8 F (36.6 C)   TempSrc:  Oral   SpO2:  100%   Weight: 81.6 kg    Height: 5' 3 (1.6 m)        Recent Labs  Lab 12/26/23 0804 12/31/23 1200 01/02/24 0707  HGB 8.8* 10.1* 10.6*  CALCIUM   --  7.2*  --   CREATININE 16.30* 11.63*  --   K 4.8 3.7  --    No results for input(s): IRON , TIBC, FERRITIN in the last 168 hours. Inpatient medications:

## 2024-01-05 ENCOUNTER — Emergency Department (HOSPITAL_COMMUNITY)
Admission: EM | Admit: 2024-01-05 | Discharge: 2024-01-05 | Discharge status: Against Medical Advice | Attending: Student in an Organized Health Care Education/Training Program | Admitting: Student in an Organized Health Care Education/Training Program

## 2024-01-05 ENCOUNTER — Other Ambulatory Visit: Payer: Self-pay

## 2024-01-05 DIAGNOSIS — E875 Hyperkalemia: Secondary | ICD-10-CM | POA: Diagnosis not present

## 2024-01-05 DIAGNOSIS — Z992 Dependence on renal dialysis: Secondary | ICD-10-CM | POA: Insufficient documentation

## 2024-01-05 DIAGNOSIS — N186 End stage renal disease: Secondary | ICD-10-CM | POA: Diagnosis not present

## 2024-01-05 DIAGNOSIS — Z5329 Procedure and treatment not carried out because of patient's decision for other reasons: Secondary | ICD-10-CM | POA: Diagnosis not present

## 2024-01-05 DIAGNOSIS — I120 Hypertensive chronic kidney disease with stage 5 chronic kidney disease or end stage renal disease: Secondary | ICD-10-CM | POA: Diagnosis not present

## 2024-01-05 LAB — CBC
HCT: 31.4 % — ABNORMAL LOW (ref 39.0–52.0)
Hemoglobin: 10.2 g/dL — ABNORMAL LOW (ref 13.0–17.0)
MCH: 30 pg (ref 26.0–34.0)
MCHC: 32.5 g/dL (ref 30.0–36.0)
MCV: 92.4 fL (ref 80.0–100.0)
Platelets: 279 K/uL (ref 150–400)
RBC: 3.4 MIL/uL — ABNORMAL LOW (ref 4.22–5.81)
RDW: 16.8 % — ABNORMAL HIGH (ref 11.5–15.5)
WBC: 6.4 K/uL (ref 4.0–10.5)
nRBC: 0 % (ref 0.0–0.2)

## 2024-01-05 LAB — BASIC METABOLIC PANEL WITH GFR
Anion gap: 19 — ABNORMAL HIGH (ref 5–15)
BUN: 65 mg/dL — ABNORMAL HIGH (ref 6–20)
CO2: 25 mmol/L (ref 22–32)
Calcium: 7.9 mg/dL — ABNORMAL LOW (ref 8.9–10.3)
Chloride: 98 mmol/L (ref 98–111)
Creatinine, Ser: 12.26 mg/dL — ABNORMAL HIGH (ref 0.61–1.24)
GFR, Estimated: 4 mL/min — ABNORMAL LOW (ref >60)
Glucose, Bld: 96 mg/dL (ref 70–99)
Potassium: 5.3 mmol/L — ABNORMAL HIGH (ref 3.5–5.1)
Sodium: 142 mmol/L (ref 135–145)

## 2024-01-05 MED ORDER — DARBEPOETIN ALFA 150 MCG/0.3ML IJ SOSY
150.0000 ug | PREFILLED_SYRINGE | Freq: Once | INTRAMUSCULAR | Status: DC
  Filled 2024-01-05: qty 0.3

## 2024-01-05 MED ORDER — NEPRO/CARBSTEADY PO LIQD: 237.0000 mL | ORAL | Status: DC | PRN

## 2024-01-05 MED ORDER — HEPARIN SODIUM (PORCINE) 1000 UNIT/ML DIALYSIS
1000.0000 [IU] | INTRAMUSCULAR | Status: DC | PRN
Start: 2024-01-05 — End: 2024-01-05

## 2024-01-05 MED ORDER — CHLORHEXIDINE GLUCONATE CLOTH 2 % EX PADS: 6.0000 | MEDICATED_PAD | Freq: Every day | CUTANEOUS | Status: DC

## 2024-01-05 MED ORDER — LIDOCAINE HCL (PF) 1 % IJ SOLN: 5.0000 mL | INTRAMUSCULAR | Status: DC | PRN

## 2024-01-05 MED ORDER — ALTEPLASE 2 MG IJ SOLR: 2.0000 mg | Freq: Once | INTRAMUSCULAR | Status: DC | PRN

## 2024-01-05 MED ORDER — LIDOCAINE-PRILOCAINE 2.5-2.5 % EX CREA: 1.0000 | TOPICAL_CREAM | CUTANEOUS | Status: DC | PRN

## 2024-01-05 MED ORDER — ANTICOAGULANT SODIUM CITRATE 4% (200MG/5ML) IV SOLN
5.0000 mL | Status: DC | PRN
Start: 2024-01-05 — End: 2024-01-05

## 2024-01-05 MED ORDER — PENTAFLUOROPROP-TETRAFLUOROETH EX AERO: 1.0000 | INHALATION_SPRAY | CUTANEOUS | Status: DC | PRN

## 2024-01-05 MED ORDER — HEPARIN SODIUM (PORCINE) 1000 UNIT/ML DIALYSIS: 2400.0000 [IU] | Freq: Once | INTRAMUSCULAR | Status: DC

## 2024-01-05 NOTE — Procedures (Signed)
 Asked to see this patient for hospital dialysis. Pt was discharged from his outpatient unit for behavior issues. The plan will be for ED HD. Pt is not to be admitted at this time. Pt will go to the dialysis unit when they are ready for the patient. When dialysis is completed pt will be sent back to ED for reassessment.    Last OP HD unit info -->  from 07/2023 --> 4h  B400 76.5kg  2K bath RUA AVF Heparin  2400    Clinical issues other than volume: 1) Anemia of esrd:  - pt dose not have a nephrologist or HD unit so does not have access to ESA or IV iron , other than when here for hospital HD sessions - last tsat 24% and ferritin 993 on 12/12/23, consider IV Fe - recent Hb around 10   - last 3 doses of darbepoeitin here -->   - 150 mcg 8/11 - 150 mcg 8/18 - 150 mcg 12/29/23   PLAN: --> give darbe sq this week 150 mcg    I was present at the procedure, reviewed the HD regimen and made appropriate changes.   Myer Fret MD  [facility] 01/05/2024, 7:40 AM

## 2024-01-05 NOTE — Progress Notes (Addendum)
 Received patient in bed.Alert and oriented x 4. He signed his hd treatment's consent.  Access used:Right arm avf that worked well.  Duration of treatment: 3.5 hours.  Net uf 2.4 liters.   Hemo comment :Had a severe cramping on his last 30 minutes,thus net uf of 2.4 liters.  Hand off to the patient's nurse,back yo E.D for discharge.

## 2024-01-05 NOTE — ED Triage Notes (Signed)
 Patient is here for his routine hemodialysis treatment . No other complaints .

## 2024-01-05 NOTE — ED Provider Notes (Signed)
 [name] EMERGENCY DEPARTMENT AT [facility] Provider Note   CSN: [id number] Arrival date & time: 01/05/24  0630     Patient presents with: Hemodialysis   Paul [name] is a 58 y.o. male.   58 year old male with past medical history of end-stage renal disease on hemodialysis presenting to the emergency department for routine dialysis.  He received dialysis Monday Wednesday Fridays.  Last dialysis session was 2 days ago.  He currently has no complaints.        Prior to Admission medications   Medication Sig Start Date End Date Taking? Authorizing Provider  ARIPiprazole  (ABILIFY ) 5 MG tablet Take 1 tablet (5 mg total) by mouth at bedtime. 10/28/23   Plovsky, Elna, MD  carvedilol  (COREG ) 6.25 MG tablet Take 1 tablet (6.25 mg total) by mouth 2 (two) times daily with a meal. 07/29/23 10/28/23  Tawkaliyar, Roya, DO  isosorbide  mononitrate (IMDUR ) 30 MG 24 hr tablet Take 1 tablet (30 mg total) by mouth daily. Patient not taking: Reported on 10/28/2023 10/25/23   [name], MD  rosuvastatin  (CRESTOR ) 5 MG tablet Take 1 tablet (5 mg total) by mouth daily. 03/24/23     sucroferric oxyhydroxide (VELPHORO ) 500 MG chewable tablet Chew 3 tablets (1,500 mg total) by mouth 3 (three) times daily with meals. Patient not taking: Reported on 10/28/2023 07/29/23   Tawkaliyar, Roya, DO    Allergies: Penicillin g    Review of Systems  All other systems reviewed and are negative.   Updated Vital Signs BP (!) 181/93   Pulse 81   Temp 97.6 F (36.4 C)   Resp 17   SpO2 99%   Physical Exam Vitals and nursing note reviewed.  Constitutional:      General: He is not in acute distress. Eyes:     Conjunctiva/sclera: Conjunctivae normal.  Cardiovascular:     Rate and Rhythm: Normal rate.     Pulses: Normal pulses.  Pulmonary:     Effort: Pulmonary effort is normal. No respiratory distress.  Musculoskeletal:     Cervical back: Neck supple.  Skin:    General: Skin is warm.   Neurological:     Mental Status: He is alert. Mental status is at baseline.     (all labs ordered are listed, but only abnormal results are displayed) Labs Reviewed  BASIC METABOLIC PANEL WITH GFR - Abnormal; Notable for the following components:      Result Value   Potassium 5.3 (*)    BUN 65 (*)    Creatinine, Ser 12.26 (*)    Calcium  7.9 (*)    GFR, Estimated 4 (*)    Anion gap 19 (*)    All other components within normal limits    EKG: EKG Interpretation Date/Time:  Monday January 05 2024 07:13:03 EDT Ventricular Rate:  75 PR Interval:  194 QRS Duration:  96 QT Interval:  452 QTC Calculation: 504 R Axis:   95  Text Interpretation: Normal sinus rhythm Rightward axis Septal infarct , age undetermined Prolonged QT Abnormal ECG When compared with ECG of 02-Jan-2024 07:01, PREVIOUS ECG IS PRESENT Confirmed by Corinthia No [phone]) on 01/05/2024 7:26:18 AM  Radiology: No results found.   Procedures   Medications Ordered in the ED  Chlorhexidine  Gluconate Cloth 2 % PADS 6 each (has no administration in time range)  Darbepoetin Alfa  (ARANESP ) injection 150 mcg (has no administration in time range)    Clinical Course as of 01/05/24 0847  [facility] Jan 05, 2024  9272 Consult for nephrology placed [AL]  [phone] Nephrology accepts for HD [AL]    Clinical Course User Index [AL] Lanie Schelling, DO                                 Medical Decision Making Differential includes but not limited to hypertensive emergency, fluid overload, hyperkalemia, and others. Patient currently has no complaints and is here for scheduled hemodialysis.  Blood pressure elevated at 181/93.  EKG and BMP ordered for further evaluation.  Nephrology will be consulted for dialysis. EKG sinus rhythm rate 75, borderline prolonged QT, no evidence of acute ischemia or findings consistent with significant hyperkalemia. BMP showing slight hyperkalemia and severely elevated Cr. Pt will need emergent HD and Dr.  Geralynn with nephrology has accepted the patient for this to be performed today.  Amount and/or Complexity of Data Reviewed Labs: ordered.  Risk Decision regarding hospitalization.     Final diagnoses:  ESRD (end stage renal disease) on dialysis [facility])    ED Discharge Orders     None          Jerrard Bradburn, DO 01/05/24 [phone]

## 2024-01-07 ENCOUNTER — Emergency Department (HOSPITAL_COMMUNITY)
Admission: EM | Admit: 2024-01-07 | Discharge: 2024-01-07 | Discharge status: Against Medical Advice | Attending: Emergency Medicine | Admitting: Emergency Medicine

## 2024-01-07 DIAGNOSIS — N186 End stage renal disease: Secondary | ICD-10-CM | POA: Insufficient documentation

## 2024-01-07 DIAGNOSIS — Z5329 Procedure and treatment not carried out because of patient's decision for other reasons: Secondary | ICD-10-CM | POA: Insufficient documentation

## 2024-01-07 DIAGNOSIS — I120 Hypertensive chronic kidney disease with stage 5 chronic kidney disease or end stage renal disease: Secondary | ICD-10-CM | POA: Diagnosis not present

## 2024-01-07 DIAGNOSIS — Z992 Dependence on renal dialysis: Secondary | ICD-10-CM | POA: Diagnosis not present

## 2024-01-07 MED ORDER — CHLORHEXIDINE GLUCONATE CLOTH 2 % EX PADS: 6.0000 | MEDICATED_PAD | Freq: Every day | CUTANEOUS | Status: DC

## 2024-01-07 NOTE — Progress Notes (Signed)
  Received patient in bed to unit.   Informed consent signed and in chart.    TX duration:     Transported by   Hand-off given to patient's nurse. yes   Access used: fistula Access issues: None   Total UF removed: 1.9 Medication(s) given: none Post HD VS: WNL Post HD weight:      Hunter Hacking LPN Kidney Dialysis Unit

## 2024-01-07 NOTE — ED Notes (Signed)
 Pt transported to Dialysis ?

## 2024-01-07 NOTE — ED Triage Notes (Signed)
 Pt. Here for dialysis. Completed full treatment on Monday. Pt. Stated, feeling pretty good.

## 2024-01-07 NOTE — Procedures (Signed)
 Asked to see this patient for hospital dialysis. Pt was discharged from his outpatient unit for behavior issues. The plan will be for ED HD. Pt is not to be admitted at this time. Pt will go to the dialysis unit when they are ready for the patient. When dialysis is completed pt will be sent back to ED for reassessment.    Last OP HD unit info -->  from 07/2023 --> 4h  B400 76.5kg  2K bath RUA AVF Heparin  2400    Clinical issues other than volume: 1) Anemia of esrd:  - pt dose not have a nephrologist or HD unit so does not have access to ESA or IV iron , other than when here for hospital HD sessions - last tsat 24% and ferritin 993 on 12/12/23, consider IV Fe - recent Hb around 10   - last 3 doses of darbepoeitin here -->   - 150 mcg 8/11 - 150 mcg 8/18 - 150 mcg 12/29/23  Plan: ->  give next dose of esa (darbe 150 mcg) this week    I was present at the procedure, reviewed the HD regimen and made appropriate changes.   Myer Fret MD  [facility] 01/07/2024, 2:17 PM   Vitals:   01/07/24 1158 01/07/24 1300 01/07/24 1330 01/07/24 1400  BP: (!) 156/88 (!) 152/84 129/87 132/84  Pulse: 79 74 77 75  Resp: 12 14 14 15   Temp:      SpO2: 100% 100% 98% 100%    Recent Labs  Lab 01/02/24 0707 01/05/24 0703 01/05/24 1427  HGB 10.6*  --  10.2*  ALBUMIN 3.7  --   --   CALCIUM  7.6* 7.9*  --   CREATININE 10.85* 12.26*  --   K 4.3 5.3*  --     Inpatient medications:  Chlorhexidine  Gluconate Cloth  6 each Topical Q0600

## 2024-01-07 NOTE — ED Provider Notes (Signed)
  [place] EMERGENCY DEPARTMENT AT [facility] Provider Note   CSN: [id number] Arrival date & time: 01/07/24  0700     Patient presents with: Vascular Access Problem   Paul [name] is a 58 y.o. male.   Patient presents to the emergency department requesting dialysis.  Patient does not have a home dialysis facility.  Patient states he comes here Monday Wednesday and Friday for dialysis.  Patient is feeling well today.  He denies any complaints.  Patient last had dialysis on Monday.  The history is provided by the patient. No language interpreter was used.       Prior to Admission medications   Medication Sig Start Date End Date Taking? Authorizing Provider  ARIPiprazole  (ABILIFY ) 5 MG tablet Take 1 tablet (5 mg total) by mouth at bedtime. 10/28/23   Plovsky, Elna, MD  carvedilol  (COREG ) 6.25 MG tablet Take 1 tablet (6.25 mg total) by mouth 2 (two) times daily with a meal. 07/29/23 01/05/24  Tawkaliyar, Roya, DO  isosorbide  mononitrate (IMDUR ) 30 MG 24 hr tablet Take 1 tablet (30 mg total) by mouth daily. Patient not taking: Reported on 10/28/2023 10/25/23   [name], MD  rosuvastatin  (CRESTOR ) 5 MG tablet Take 1 tablet (5 mg total) by mouth daily. 03/24/23     sucroferric oxyhydroxide (VELPHORO ) 500 MG chewable tablet Chew 3 tablets (1,500 mg total) by mouth 3 (three) times daily with meals. Patient not taking: No sig reported 07/29/23   Heddy Barren, DO    Allergies: Penicillin g    Review of Systems  All other systems reviewed and are negative.   Updated Vital Signs BP (!) 171/98 (BP Location: Left Arm)   Pulse 81   Temp (!) 97.5 F (36.4 C)   Resp 16   SpO2 97%   Physical Exam Vitals reviewed.  Constitutional:      Appearance: Normal appearance.  Cardiovascular:     Rate and Rhythm: Normal rate.  Pulmonary:     Effort: Pulmonary effort is normal.  Abdominal:     General: Abdomen is flat.  Musculoskeletal:        General: Normal range  of motion.  Skin:    General: Skin is warm.  Neurological:     General: No focal deficit present.     Mental Status: He is alert.     (all labs ordered are listed, but only abnormal results are displayed) Labs Reviewed - No data to display  EKG: None  Radiology: No results found.   Procedures   Medications Ordered in the ED - No data to display                                  Medical Decision Making Pt here requesting dialysis.  Pt last had dialysis on Monday.  No currrent complaints   Amount and/or Complexity of Data Reviewed Independent Historian: parent Labs: ordered. Decision-making details documented in ED Course. Discussion of management or test interpretation with external provider(s): I discussed the pt with Dr. Geralynn  Nephrology who will arrange dialysis today         Final diagnoses:  ESRD (end stage renal disease) on dialysis [facility])    ED Discharge Orders     None          Burma Ketcher K, PA-C 01/07/24 0801    Ruthe Cornet, DO 01/07/24 1247

## 2024-01-09 ENCOUNTER — Emergency Department (HOSPITAL_COMMUNITY)
Admission: EM | Admit: 2024-01-09 | Discharge: 2024-01-09 | Discharge status: Against Medical Advice | Attending: Emergency Medicine | Admitting: Emergency Medicine

## 2024-01-09 ENCOUNTER — Encounter (HOSPITAL_COMMUNITY): Payer: Self-pay

## 2024-01-09 ENCOUNTER — Other Ambulatory Visit: Payer: Self-pay

## 2024-01-09 DIAGNOSIS — Z992 Dependence on renal dialysis: Secondary | ICD-10-CM | POA: Insufficient documentation

## 2024-01-09 DIAGNOSIS — I509 Heart failure, unspecified: Secondary | ICD-10-CM | POA: Diagnosis not present

## 2024-01-09 DIAGNOSIS — Z79899 Other long term (current) drug therapy: Secondary | ICD-10-CM | POA: Diagnosis not present

## 2024-01-09 DIAGNOSIS — Z87891 Personal history of nicotine dependence: Secondary | ICD-10-CM | POA: Insufficient documentation

## 2024-01-09 DIAGNOSIS — I120 Hypertensive chronic kidney disease with stage 5 chronic kidney disease or end stage renal disease: Secondary | ICD-10-CM | POA: Diagnosis not present

## 2024-01-09 DIAGNOSIS — N186 End stage renal disease: Secondary | ICD-10-CM | POA: Diagnosis not present

## 2024-01-09 DIAGNOSIS — I132 Hypertensive heart and chronic kidney disease with heart failure and with stage 5 chronic kidney disease, or end stage renal disease: Secondary | ICD-10-CM | POA: Diagnosis not present

## 2024-01-09 LAB — COMPREHENSIVE METABOLIC PANEL WITH GFR
ALT: 12 U/L (ref 0–44)
AST: 12 U/L — ABNORMAL LOW (ref 15–41)
Albumin: 3.8 g/dL (ref 3.5–5.0)
Alkaline Phosphatase: 81 U/L (ref 38–126)
Anion gap: 22 — ABNORMAL HIGH (ref 5–15)
BUN: 59 mg/dL — ABNORMAL HIGH (ref 6–20)
CO2: 19 mmol/L — ABNORMAL LOW (ref 22–32)
Calcium: 7.4 mg/dL — ABNORMAL LOW (ref 8.9–10.3)
Chloride: 99 mmol/L (ref 98–111)
Creatinine, Ser: 10.8 mg/dL — ABNORMAL HIGH (ref 0.61–1.24)
GFR, Estimated: 5 mL/min — ABNORMAL LOW (ref >60)
Glucose, Bld: 92 mg/dL (ref 70–99)
Potassium: 4.5 mmol/L (ref 3.5–5.1)
Sodium: 140 mmol/L (ref 135–145)
Total Bilirubin: 1 mg/dL (ref 0.0–1.2)
Total Protein: 7.4 g/dL (ref 6.5–8.1)

## 2024-01-09 LAB — CBC WITH DIFFERENTIAL/PLATELET
Abs Immature Granulocytes: 0.02 K/uL (ref 0.00–0.07)
Basophils Absolute: 0.1 K/uL (ref 0.0–0.1)
Basophils Relative: 1 %
Eosinophils Absolute: 0.2 K/uL (ref 0.0–0.5)
Eosinophils Relative: 3 %
HCT: 33.6 % — ABNORMAL LOW (ref 39.0–52.0)
Hemoglobin: 10.9 g/dL — ABNORMAL LOW (ref 13.0–17.0)
Immature Granulocytes: 0 %
Lymphocytes Relative: 19 %
Lymphs Abs: 1.2 K/uL (ref 0.7–4.0)
MCH: 29.9 pg (ref 26.0–34.0)
MCHC: 32.4 g/dL (ref 30.0–36.0)
MCV: 92.1 fL (ref 80.0–100.0)
Monocytes Absolute: 0.7 K/uL (ref 0.1–1.0)
Monocytes Relative: 11 %
Neutro Abs: 4.1 K/uL (ref 1.7–7.7)
Neutrophils Relative %: 66 %
Platelets: 289 K/uL (ref 150–400)
RBC: 3.65 MIL/uL — ABNORMAL LOW (ref 4.22–5.81)
RDW: 16.6 % — ABNORMAL HIGH (ref 11.5–15.5)
WBC: 6.1 K/uL (ref 4.0–10.5)
nRBC: 0 % (ref 0.0–0.2)

## 2024-01-09 LAB — I-STAT CHEM 8, ED
BUN: 54 mg/dL — ABNORMAL HIGH (ref 6–20)
Calcium, Ion: 0.87 mmol/L — CL (ref 1.15–1.40)
Chloride: 103 mmol/L (ref 98–111)
Creatinine, Ser: 11.1 mg/dL — ABNORMAL HIGH (ref 0.61–1.24)
Glucose, Bld: 86 mg/dL (ref 70–99)
HCT: 35 % — ABNORMAL LOW (ref 39.0–52.0)
Hemoglobin: 11.9 g/dL — ABNORMAL LOW (ref 13.0–17.0)
Potassium: 4.4 mmol/L (ref 3.5–5.1)
Sodium: 138 mmol/L (ref 135–145)
TCO2: 21 mmol/L — ABNORMAL LOW (ref 22–32)

## 2024-01-09 MED ORDER — HEPARIN SODIUM (PORCINE) 1000 UNIT/ML IJ SOLN
2400.0000 [IU] | Freq: Once | INTRAMUSCULAR | Status: AC
  Administered 2024-01-09: 2400 [IU] via INTRAVENOUS

## 2024-01-09 MED ORDER — ALTEPLASE 2 MG IJ SOLR: 2.0000 mg | Freq: Once | INTRAMUSCULAR | Status: DC | PRN

## 2024-01-09 MED ORDER — PENTAFLUOROPROP-TETRAFLUOROETH EX AERO: 1.0000 | INHALATION_SPRAY | CUTANEOUS | Status: DC | PRN

## 2024-01-09 MED ORDER — HEPARIN SODIUM (PORCINE) 1000 UNIT/ML IJ SOLN
INTRAMUSCULAR | Status: AC
  Filled 2024-01-09: qty 3

## 2024-01-09 MED ORDER — HEPARIN SODIUM (PORCINE) 1000 UNIT/ML DIALYSIS: 1000.0000 [IU] | INTRAMUSCULAR | Status: DC | PRN

## 2024-01-09 MED ORDER — CHLORHEXIDINE GLUCONATE CLOTH 2 % EX PADS: 6.0000 | MEDICATED_PAD | Freq: Every day | CUTANEOUS | Status: DC

## 2024-01-09 MED ORDER — LIDOCAINE-PRILOCAINE 2.5-2.5 % EX CREA: 1.0000 | TOPICAL_CREAM | CUTANEOUS | Status: DC | PRN

## 2024-01-09 MED ORDER — LIDOCAINE HCL (PF) 1 % IJ SOLN: 5.0000 mL | INTRAMUSCULAR | Status: DC | PRN

## 2024-01-09 NOTE — ED Notes (Signed)
 Awaiting pt from lobby

## 2024-01-09 NOTE — ED Notes (Signed)
 Phlebotomy to collect chem 8 blood work.

## 2024-01-09 NOTE — ED Triage Notes (Signed)
 Pt here for dialysis. Pt had dialysis 2 days ago, full treatment. Pt denies any other concerns.

## 2024-01-09 NOTE — Procedures (Signed)
 Asked to see this patient for hospital dialysis. Pt was discharged from his outpatient unit for behavior issues. The plan will be for ED HD. Pt is not to be admitted at this time. Pt will go to the dialysis unit when they are ready for the patient. When dialysis is completed pt will be sent back to ED for reassessment.   Vitals:   01/09/24 1430 01/09/24 1500 01/09/24 1530 01/09/24 1549  BP: (!) 147/98 (!) 149/97 (!) 168/87 (!) 140/74  Pulse: 69 76 81 85  Resp: 14 14 17 16   Temp:      TempSrc:      SpO2: 100% 100% 100% 100%  Weight:      Height:           I was present at the procedure, reviewed the HD regimen and made appropriate changes.   Myer Fret MD  [facility] 01/09/2024, 4:06 PM    Recent Labs  Lab 01/05/24 0703 01/05/24 1427 01/09/24 0704 01/09/24 0737  HGB  --    < > 10.9* 11.9*  ALBUMIN  --   --  3.8  --   CALCIUM  7.9*  --  7.4*  --   CREATININE 12.26*  --  10.80* 11.10*  K 5.3*  --  4.5 4.4   < > = values in this interval not displayed.   No results for input(s): IRON , TIBC, FERRITIN in the last 168 hours. Inpatient medications:  Chlorhexidine  Gluconate Cloth  6 each Topical Q0600    alteplase , heparin , lidocaine  (PF), lidocaine -prilocaine , pentafluoroprop-tetrafluoroeth

## 2024-01-09 NOTE — Progress Notes (Signed)
 Contacted local nephrology group to inquire if pt could/would ever be considered as a possible pt at any of the local HD clinics. Will await a response. At this time, pt does not have an accepting nephrologist or clinic for out-pt HD needs. Referrals have been submitted and pt has unfortunately been denied. There are no out-pt HD options at this time.   Randine Mungo Dialysis Navigator [phone]

## 2024-01-09 NOTE — Progress Notes (Signed)
   01/09/24 1734  Vitals  Temp 97.6 F (36.4 C)  Pulse Rate 78  Resp 17  BP (!) 165/88  SpO2 100 %  O2 Device Room Air  Weight 80.2 kg  Oxygen Therapy  Patient Activity (if Appropriate) In bed  Pulse Oximetry Type Continuous  Post Treatment  Hemodialysis Intake (mL) 0 mL  Liters Processed 73.5  Fluid Removed (mL) 2300 mL  Tolerated HD Treatment Yes  AVG/AVF Arterial Site Held (minutes) 10 minutes  AVG/AVF Venous Site Held (minutes) 10 minutes   Received patient in bed to unit.  Alert and oriented.  Informed consent signed and in chart.   TX duration:3.5  Patient tolerated well.  Transported back to the room  Alert, without acute distress.  Hand-off given to patient's nurse.   Access used: RUA access Access issues: no complications  Total UF removed: 2300 Medication(s) given: none Delon LITTIE Engel [facility]
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
## 2024-01-12 ENCOUNTER — Encounter (HOSPITAL_COMMUNITY): Payer: Self-pay | Admitting: *Deleted

## 2024-01-12 ENCOUNTER — Telehealth: Payer: Self-pay | Admitting: Internal Medicine

## 2024-01-12 ENCOUNTER — Emergency Department (HOSPITAL_COMMUNITY)
Admission: EM | Admit: 2024-01-12 | Discharge: 2024-01-12 | Disposition: A | Discharge status: Home / Self Care | Attending: Emergency Medicine | Admitting: Emergency Medicine

## 2024-01-12 ENCOUNTER — Other Ambulatory Visit: Payer: Self-pay

## 2024-01-12 DIAGNOSIS — Z992 Dependence on renal dialysis: Secondary | ICD-10-CM | POA: Diagnosis not present

## 2024-01-12 DIAGNOSIS — I132 Hypertensive heart and chronic kidney disease with heart failure and with stage 5 chronic kidney disease, or end stage renal disease: Secondary | ICD-10-CM | POA: Diagnosis not present

## 2024-01-12 DIAGNOSIS — N186 End stage renal disease: Secondary | ICD-10-CM | POA: Insufficient documentation

## 2024-01-12 DIAGNOSIS — E875 Hyperkalemia: Secondary | ICD-10-CM

## 2024-01-12 DIAGNOSIS — I509 Heart failure, unspecified: Secondary | ICD-10-CM | POA: Insufficient documentation

## 2024-01-12 DIAGNOSIS — I120 Hypertensive chronic kidney disease with stage 5 chronic kidney disease or end stage renal disease: Secondary | ICD-10-CM | POA: Diagnosis not present

## 2024-01-12 LAB — CBC
HCT: 32.8 % — ABNORMAL LOW (ref 39.0–52.0)
Hemoglobin: 10.7 g/dL — ABNORMAL LOW (ref 13.0–17.0)
MCH: 29.9 pg (ref 26.0–34.0)
MCHC: 32.6 g/dL (ref 30.0–36.0)
MCV: 91.6 fL (ref 80.0–100.0)
Platelets: 268 K/uL (ref 150–400)
RBC: 3.58 MIL/uL — ABNORMAL LOW (ref 4.22–5.81)
RDW: 16.4 % — ABNORMAL HIGH (ref 11.5–15.5)
WBC: 6 K/uL (ref 4.0–10.5)
nRBC: 0 % (ref 0.0–0.2)

## 2024-01-12 LAB — BASIC METABOLIC PANEL WITH GFR
Anion gap: 22 — ABNORMAL HIGH (ref 5–15)
BUN: 78 mg/dL — ABNORMAL HIGH (ref 6–20)
CO2: 17 mmol/L — ABNORMAL LOW (ref 22–32)
Calcium: 7.6 mg/dL — ABNORMAL LOW (ref 8.9–10.3)
Chloride: 99 mmol/L (ref 98–111)
Creatinine, Ser: 12.53 mg/dL — ABNORMAL HIGH (ref 0.61–1.24)
GFR, Estimated: 4 mL/min — ABNORMAL LOW (ref >60)
Glucose, Bld: 99 mg/dL (ref 70–99)
Potassium: 5.3 mmol/L — ABNORMAL HIGH (ref 3.5–5.1)
Sodium: 138 mmol/L (ref 135–145)

## 2024-01-12 MED ORDER — LIDOCAINE HCL (PF) 1 % IJ SOLN
5.0000 mL | INTRAMUSCULAR | Status: DC | PRN
Start: 2024-01-12 — End: 2024-01-12

## 2024-01-12 MED ORDER — CHLORHEXIDINE GLUCONATE CLOTH 2 % EX PADS: 6.0000 | MEDICATED_PAD | Freq: Every day | CUTANEOUS | Status: DC

## 2024-01-12 MED ORDER — LIDOCAINE-PRILOCAINE 2.5-2.5 % EX CREA: 1.0000 | TOPICAL_CREAM | CUTANEOUS | Status: DC | PRN

## 2024-01-12 MED ORDER — ALTEPLASE 2 MG IJ SOLR: 2.0000 mg | Freq: Once | INTRAMUSCULAR | Status: DC | PRN

## 2024-01-12 MED ORDER — HEPARIN SODIUM (PORCINE) 1000 UNIT/ML DIALYSIS: 1000.0000 [IU] | INTRAMUSCULAR | Status: DC | PRN

## 2024-01-12 MED ORDER — PENTAFLUOROPROP-TETRAFLUOROETH EX AERO: 1.0000 | INHALATION_SPRAY | CUTANEOUS | Status: DC | PRN

## 2024-01-12 MED ORDER — SODIUM ZIRCONIUM CYCLOSILICATE 10 G PO PACK
10.0000 g | PACK | Freq: Once | ORAL | Status: AC
  Administered 2024-01-12: 10 g via ORAL
  Filled 2024-01-12: qty 1

## 2024-01-12 MED ORDER — ANTICOAGULANT SODIUM CITRATE 4% (200MG/5ML) IV SOLN
5.0000 mL | Status: DC | PRN
Start: 2024-01-12 — End: 2024-01-12
  Filled 2024-01-12: qty 5

## 2024-01-12 MED ORDER — HEPARIN SODIUM (PORCINE) 1000 UNIT/ML DIALYSIS
2000.0000 [IU] | Freq: Once | INTRAMUSCULAR | Status: AC
  Administered 2024-01-12: 2000 [IU] via INTRAVENOUS_CENTRAL

## 2024-01-12 NOTE — ED Notes (Signed)
 Extra 2 SST drawn

## 2024-01-12 NOTE — ED Triage Notes (Signed)
 Patient is here for dialysis no other complaints

## 2024-01-12 NOTE — ED Notes (Signed)
 Pt returned from dialysis, no acute distress noted. Pt grabbed his belongings and ambulated with steady gait to lobby
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
## 2024-01-12 NOTE — Progress Notes (Signed)
   01/12/24 1906  Vitals  Temp 98.5 F (36.9 C)  Pulse Rate 78  Resp 15  BP (!) 150/93  SpO2 99 %  O2 Device Room Air  Weight 78 kg  Type of Weight Post-Dialysis  Oxygen Therapy  Patient Activity (if Appropriate) In bed  Pulse Oximetry Type Continuous  Post Treatment  Dialyzer Clearance Lightly streaked  Hemodialysis Intake (mL) 0 mL  Liters Processed 49  Fluid Removed (mL) 2000 mL  Tolerated HD Treatment Yes  AVG/AVF Arterial Site Held (minutes) 5 minutes  AVG/AVF Venous Site Held (minutes) 5 minutes   Received patient in bed to unit.  Alert and oriented.  Informed consent signed and in chart.   TX duration:3.5  Patient tolerated well.  Transported back to the room  Alert, without acute distress.  Hand-off given to patient's nurse.   Access used: LUAF Access issues: see progress notes  Total UF removed: 2000 Medication(s) given: none   Delon LITTIE [facility]

## 2024-01-12 NOTE — Telephone Encounter (Signed)
 Pt unconfirmed appt 9/9

## 2024-01-12 NOTE — Progress Notes (Signed)
 Made Dr. Norine and Izetta Boehringer PA that this pts access--RUAF---is regularly trending with increased venous pressures---350 to 380 with a BFR of 250 Arrangements to be made for an outpatient fistulogram per Dr. Norine Instructed the pt in the need to look for numbers from [facility] for the scheduling of this procedure---verbalized understanding

## 2024-01-13 ENCOUNTER — Ambulatory Visit: Payer: Self-pay | Attending: Family Medicine | Admitting: Family Medicine

## 2024-01-13 ENCOUNTER — Encounter: Payer: Self-pay | Admitting: Family Medicine

## 2024-01-13 ENCOUNTER — Other Ambulatory Visit (HOSPITAL_COMMUNITY): Payer: Self-pay

## 2024-01-13 VITALS — BP 157/91 | HR 80 | Temp 97.9°F | Resp 16 | Wt 182.0 lb

## 2024-01-13 DIAGNOSIS — F250 Schizoaffective disorder, bipolar type: Secondary | ICD-10-CM | POA: Diagnosis not present

## 2024-01-13 DIAGNOSIS — E782 Mixed hyperlipidemia: Secondary | ICD-10-CM | POA: Diagnosis not present

## 2024-01-13 DIAGNOSIS — Z992 Dependence on renal dialysis: Secondary | ICD-10-CM

## 2024-01-13 DIAGNOSIS — N186 End stage renal disease: Secondary | ICD-10-CM | POA: Diagnosis not present

## 2024-01-13 DIAGNOSIS — I120 Hypertensive chronic kidney disease with stage 5 chronic kidney disease or end stage renal disease: Secondary | ICD-10-CM | POA: Diagnosis not present

## 2024-01-13 MED ORDER — CARVEDILOL 6.25 MG PO TABS
6.2500 mg | ORAL_TABLET | Freq: Two times a day (BID) | ORAL | 1 refills | Status: AC
  Filled 2024-01-13: qty 180, 90d supply, fill #0
  Filled 2024-05-03: qty 180, 90d supply, fill #1

## 2024-01-13 NOTE — Progress Notes (Signed)
 Subjective:  Patient ID: Paul [name], male    DOB: [birth year]  Age: 58 y.o. MRN: [id number]  CC: Medication Refill     Discussed the use of AI scribe software for clinical note transcription with the patient, who gave verbal consent to proceed.  History of Present Illness Paul [name] is a 58 year old male patient of Dr. Vicci with hypertension, schizoaffective disorder and end-stage renal disease on dialysis who presents for medication refill.  He has been out of carvedilol  for two to three weeks, which he takes for blood pressure management. He continues to take isosorbide  for blood pressure control and has six refills available at the pharmacy.  He undergoes dialysis three times a week on Monday, Wednesday, and Friday at [facility]. He finds the process time-consuming and expresses frustration with having to go to the hospital for dialysis.  He has a history of schizoaffective disorder and currently sees a psychiatrist, although he questions the necessity of these visits. He is currently taking Crestor  for cholesterol management, which he describes as a very tiny pill taken once a day. He confirms he still has this medication available.  He does not receive flu or pneumonia shots and prefers not to take them.    Past Medical History:  Diagnosis Date   Bipolar disorder (HCC)    Central retinal artery occlusion    with macular infarction of the right eye. status post posterior vitrectomy and photocoagulation with insertion of a shunt in 2006.   CHF (congestive heart failure) (HCC)    chronic mixed syst/diast. 02/2009 echo: Systolic function was mildly reduced. The estimated ejection fraction was in 45%, global HK, Grade I Diast dysfxn.   Chronic kidney disease    ESRD   Heart failure    Obesity    Osteoarthritis    s/p right hip replacement in 2001   Schizophrenia (HCC)    Severe uncontrolled hypertension     Past Surgical History:  Procedure  Laterality Date   A/V SHUNT INTERVENTION N/A 10/08/2023   Procedure: A/V SHUNT INTERVENTION;  Surgeon: Sheree Penne Bruckner, MD;  Location: [facility];  Service: Cardiovascular;  Laterality: N/A;   AV FISTULA PLACEMENT Right 07/08/2022   Procedure: RIGHT RADIOCEPHALIC ARTERIOVENOUS (AV) FISTULA CREATION;  Surgeon: Lanis Fonda BRAVO, MD;  Location: [facility] OR;  Service: Vascular;  Laterality: Right;   IR FLUORO GUIDE CV LINE RIGHT  05/13/2022   IR US  GUIDE VASC ACCESS RIGHT  05/13/2022   JOINT REPLACEMENT     right hip replacement   right eye surgery     for glaucoma   TOTAL HIP ARTHROPLASTY Bilateral 1997, 2001   UPPER EXTREMITY INTERVENTION  10/08/2023   Procedure: UPPER EXTREMITY INTERVENTION;  Surgeon: Sheree Penne Bruckner, MD;  Location: [facility];  Service: Cardiovascular;;  Stent   VENOUS ANGIOPLASTY  10/08/2023   Procedure: VENOUS ANGIOPLASTY;  Surgeon: Sheree Penne Bruckner, MD;  Location: [facility];  Service: Cardiovascular;;    Family History  Problem Relation Age of Onset   Hypertension Father    Stroke Father     Social History   Socioeconomic History   Marital status: Legally Separated    Spouse name: Not on file   Number of children: Not on file   Years of education: Not on file   Highest education level: Not on file  Occupational History   Occupation: Chartered certified accountant  Tobacco Use   Smoking status: Former   Smokeless tobacco: Never  Tobacco comments:    Quit smoking 20-30 years ago, smoked for 1 year.  Vaping Use   Vaping status: Never Used  Substance and Sexual Activity   Alcohol use: No   Drug use: No   Sexual activity: Yes    Birth control/protection: None  Other Topics Concern   Not on file  Social History Narrative   Not on file   Social Drivers of Health   Financial Resource Strain: High Risk (09/15/2023)   Overall Financial Resource Strain (CARDIA)    Difficulty of Paying Living Expenses: Very hard  Food Insecurity: No Food Insecurity (09/15/2023)    Hunger Vital Sign    Worried About Running Out of Food in the Last Year: Never true    Ran Out of Food in the Last Year: Never true  Transportation Needs: Unmet Transportation Needs (09/15/2023)   PRAPARE - Transportation    Lack of Transportation (Medical): Yes    Lack of Transportation (Non-Medical): Yes  Physical Activity: Not on file  Stress: No Stress Concern Present (09/15/2023)   [employer] of Occupational Health - Occupational Stress Questionnaire    Feeling of Stress : Only a little  Social Connections: Moderately Integrated (07/01/2023)   Social Connection and Isolation Panel    Frequency of Communication with Friends and Family: Three times a week    Frequency of Social Gatherings with Friends and Family: Never    Attends Religious Services: More than 4 times per year    Active Member of Clubs or Organizations: Yes    Attends Engineer, structural: More than 4 times per year    Marital Status: Divorced    Allergies  Allergen Reactions   Penicillin G Nausea And Vomiting    Outpatient Medications Prior to Visit  Medication Sig Dispense Refill   ARIPiprazole  (ABILIFY ) 5 MG tablet Take 1 tablet (5 mg total) by mouth at bedtime. 30 tablet 3   isosorbide  mononitrate (IMDUR ) 30 MG 24 hr tablet Take 1 tablet (30 mg total) by mouth daily. 30 tablet 6   rosuvastatin  (CRESTOR ) 5 MG tablet Take 1 tablet (5 mg total) by mouth daily. 90 tablet 3   carvedilol  (COREG ) 6.25 MG tablet Take 1 tablet (6.25 mg total) by mouth 2 (two) times daily with a meal. 60 tablet 0   sucroferric oxyhydroxide (VELPHORO ) 500 MG chewable tablet Chew 3 tablets (1,500 mg total) by mouth 3 (three) times daily with meals. (Patient not taking: Reported on 01/13/2024) 90 tablet 0   No facility-administered medications prior to visit.     ROS Review of Systems  Constitutional:  Negative for activity change and appetite change.  HENT:  Negative for sinus pressure and sore throat.   Respiratory:   Negative for chest tightness, shortness of breath and wheezing.   Cardiovascular:  Negative for chest pain and palpitations.  Gastrointestinal:  Negative for abdominal distention, abdominal pain and constipation.  Genitourinary: Negative.   Musculoskeletal: Negative.   Psychiatric/Behavioral:  Negative for behavioral problems and dysphoric mood.     Objective:  BP (!) 157/91 (BP Location: Left Arm, Patient Position: Sitting, Cuff Size: Normal)   Pulse 80   Temp 97.9 F (36.6 C) (Oral)   Resp 16   Wt 182 lb (82.6 kg)   SpO2 99%   BMI 32.24 kg/m      01/13/2024    9:10 AM 01/13/2024    8:56 AM 01/12/2024    7:06 PM  BP/Weight  Systolic BP 157 175 150  Diastolic  BP 91 93 93  Wt. (Lbs)  182 171.96  BMI  32.24 kg/m2 30.46 kg/m2      Physical Exam Constitutional:      Appearance: He is well-developed.  Cardiovascular:     Rate and Rhythm: Normal rate.     Heart sounds: Normal heart sounds. No murmur heard. Pulmonary:     Effort: Pulmonary effort is normal.     Breath sounds: Normal breath sounds. No wheezing or rales.  Chest:     Chest wall: No tenderness.  Abdominal:     General: Bowel sounds are normal. There is no distension.     Palpations: Abdomen is soft. There is no mass.     Tenderness: There is no abdominal tenderness.  Musculoskeletal:        General: Normal range of motion.     Right lower leg: No edema.     Left lower leg: No edema.     Comments: Right arm AV fistula  Neurological:     Mental Status: He is alert and oriented to person, place, and time.  Psychiatric:        Mood and Affect: Mood normal.        Latest Ref Rng & Units 01/12/2024    7:18 AM 01/09/2024    7:37 AM 01/09/2024    7:04 AM  CMP  Glucose 70 - 99 mg/dL 99  86  92   BUN 6 - 20 mg/dL 78  54  59   Creatinine 0.61 - 1.24 mg/dL 87.46  88.89  89.19   Sodium 135 - 145 mmol/L 138  138  140   Potassium 3.5 - 5.1 mmol/L 5.3  4.4  4.5   Chloride 98 - 111 mmol/L 99  103  99   CO2 22 - 32  mmol/L 17   19   Calcium  8.9 - 10.3 mg/dL 7.6   7.4   Total Protein 6.5 - 8.1 g/dL   7.4   Total Bilirubin 0.0 - 1.2 mg/dL   1.0   Alkaline Phos 38 - 126 U/L   81   AST 15 - 41 U/L   12   ALT 0 - 44 U/L   12     Lipid Panel     Component Value Date/Time   CHOL 163 06/05/2021 0057   TRIG 230 (H) 06/05/2021 0057   HDL 37 (L) 06/05/2021 0057   CHOLHDL 4.4 06/05/2021 0057   VLDL 46 (H) 06/05/2021 0057   LDLCALC 80 06/05/2021 0057    CBC    Component Value Date/Time   WBC 6.0 01/12/2024 0718   RBC 3.58 (L) 01/12/2024 0718   HGB 10.7 (L) 01/12/2024 0718   HCT 32.8 (L) 01/12/2024 0718   PLT 268 01/12/2024 0718   MCV 91.6 01/12/2024 0718   MCH 29.9 01/12/2024 0718   MCHC 32.6 01/12/2024 0718   RDW 16.4 (H) 01/12/2024 0718   LYMPHSABS 1.2 01/09/2024 0704   MONOABS 0.7 01/09/2024 0704   EOSABS 0.2 01/09/2024 0704   BASOSABS 0.1 01/09/2024 0704    Lab Results  Component Value Date   HGBA1C 5.5 06/04/2021       Assessment & Plan Hypertensive CKD, ESRD on hemodialysis Uncontrolled due to non-adherence to carvedilol . - Refilled carvedilol  with a 90-day supply and an additional 90-day supply. - Ensured isosorbide  prescription has six refills. - Undergoing hemodialysis thrice weekly at [facility].  Hyperlipidemia Patient confirms taking Crestor   -Low-cholesterol diet  Schizoaffective disorder Currently under psychiatry  care Continue medications per psychiatry  General Health Maintenance Declined flu and pneumonia vaccinations.  Follow-Up - Follow-up with primary care provider in three months.     Meds ordered this encounter  Medications   carvedilol  (COREG ) 6.25 MG tablet    Sig: Take 1 tablet (6.25 mg total) by mouth 2 (two) times daily with a meal.    Dispense:  180 tablet    Refill:  1    Follow-up: Return in about 3 months (around 04/13/2024) for Medical conditions with PCP.       Corrina Sabin, MD, FAAFP. [facility]  [facility] [place], [place] [phone]   01/13/2024, 9:16 AM

## 2024-01-13 NOTE — Patient Instructions (Signed)
 VISIT SUMMARY:  You came in today for a medication refill. We discussed your current medications and dialysis schedule, and addressed your concerns about your treatment plan.  YOUR PLAN:  -HYPERTENSION: Hypertension means high blood pressure. Your blood pressure is currently not well controlled because you have not been taking your carvedilol . We have refilled your carvedilol  with a 90-day supply and an additional 90-day supply. Please continue taking your isosorbide  as prescribed, which has six refills available at the pharmacy.  -END-STAGE RENAL DISEASE ON HEMODIALYSIS: End-stage renal disease is the final stage of chronic kidney disease where the kidneys no longer function properly. You are undergoing hemodialysis three times a week at [facility]. We understand that this process is time-consuming and frustrating, but it is essential for your health.  -HYPERLIPIDEMIA: Hyperlipidemia means having high levels of fats (lipids) in your blood. You are currently taking Crestor  for cholesterol management. Please continue taking this medication as prescribed.  -GENERAL HEALTH MAINTENANCE: You have declined flu and pneumonia vaccinations. It is important to consider these vaccinations in the future to protect against infections.  INSTRUCTIONS:  Please follow up with your primary care provider in three months.

## 2024-01-13 NOTE — Progress Notes (Signed)
 Pt is here for medication refill   Per pt he has been out of his carvedilol  for X3 weeks

## 2024-01-14 ENCOUNTER — Other Ambulatory Visit: Payer: Self-pay

## 2024-01-14 ENCOUNTER — Emergency Department (HOSPITAL_COMMUNITY)
Admission: EM | Admit: 2024-01-14 | Discharge: 2024-01-14 | Disposition: A | Discharge status: Home / Self Care | Attending: Emergency Medicine | Admitting: Emergency Medicine

## 2024-01-14 DIAGNOSIS — Z5329 Procedure and treatment not carried out because of patient's decision for other reasons: Secondary | ICD-10-CM | POA: Insufficient documentation

## 2024-01-14 DIAGNOSIS — N186 End stage renal disease: Secondary | ICD-10-CM | POA: Diagnosis not present

## 2024-01-14 DIAGNOSIS — Z992 Dependence on renal dialysis: Secondary | ICD-10-CM | POA: Insufficient documentation

## 2024-01-14 DIAGNOSIS — I120 Hypertensive chronic kidney disease with stage 5 chronic kidney disease or end stage renal disease: Secondary | ICD-10-CM | POA: Diagnosis not present

## 2024-01-14 LAB — CBC
HCT: 33 % — ABNORMAL LOW (ref 39.0–52.0)
Hemoglobin: 10.7 g/dL — ABNORMAL LOW (ref 13.0–17.0)
MCH: 29.2 pg (ref 26.0–34.0)
MCHC: 32.4 g/dL (ref 30.0–36.0)
MCV: 90.2 fL (ref 80.0–100.0)
Platelets: 297 K/uL (ref 150–400)
RBC: 3.66 MIL/uL — ABNORMAL LOW (ref 4.22–5.81)
RDW: 16.5 % — ABNORMAL HIGH (ref 11.5–15.5)
WBC: 6.5 K/uL (ref 4.0–10.5)
nRBC: 0 % (ref 0.0–0.2)

## 2024-01-14 LAB — RENAL FUNCTION PANEL
Albumin: 3.7 g/dL (ref 3.5–5.0)
Anion gap: 18 — ABNORMAL HIGH (ref 5–15)
BUN: 71 mg/dL — ABNORMAL HIGH (ref 6–20)
CO2: 19 mmol/L — ABNORMAL LOW (ref 22–32)
Calcium: 7.3 mg/dL — ABNORMAL LOW (ref 8.9–10.3)
Chloride: 102 mmol/L (ref 98–111)
Creatinine, Ser: 12.47 mg/dL — ABNORMAL HIGH (ref 0.61–1.24)
GFR, Estimated: 4 mL/min — ABNORMAL LOW
Glucose, Bld: 90 mg/dL (ref 70–99)
Phosphorus: 11.2 mg/dL — ABNORMAL HIGH (ref 2.5–4.6)
Potassium: 4.6 mmol/L (ref 3.5–5.1)
Sodium: 139 mmol/L (ref 135–145)

## 2024-01-14 MED ORDER — CHLORHEXIDINE GLUCONATE CLOTH 2 % EX PADS: 6.0000 | MEDICATED_PAD | Freq: Every day | CUTANEOUS | Status: DC

## 2024-01-14 NOTE — ED Notes (Signed)
 Report given to nurse dialysis. Stated it would be ready in about one hour and half.

## 2024-01-14 NOTE — Progress Notes (Signed)
   01/14/24 1654  Vitals  Temp (!) 97.5 F (36.4 C)  Pulse Rate 75  Resp 15  BP 139/87  SpO2 100 %  Post Treatment  Dialyzer Clearance Lightly streaked  Hemodialysis Intake (mL) 0 mL  Liters Processed 70.4  Fluid Removed (mL) 1500 mL  Tolerated HD Treatment Yes  AVG/AVF Arterial Site Held (minutes) 8 minutes  AVG/AVF Venous Site Held (minutes) 8 minutes   Received patient in bed to unit.  Alert and oriented.  Informed consent signed and in chart.   TX duration:3.5hrs  Patient tolerated well.  Transported back to the room  Alert, without acute distress.  Hand-off given to patient's nurse.   Access used: RAVF Access issues: none  Total UF removed: 1.5L Medication(s) given: none    Na'Shaminy T Karalyne [facility]

## 2024-01-14 NOTE — ED Provider Notes (Signed)
 [place] EMERGENCY DEPARTMENT AT [facility] Provider Note   CSN: [id number] Arrival date & time: 01/14/24  9355     Patient presents with: Need Dialysis   Paul [name] is a 58 y.o. male.   58 year old male who presents for routine dialysis.  Patient has a history of ESRD.  Has no new complaints at this time.       Prior to Admission medications   Medication Sig Start Date End Date Taking? Authorizing Provider  ARIPiprazole  (ABILIFY ) 5 MG tablet Take 1 tablet (5 mg total) by mouth at bedtime. 10/28/23   Plovsky, Elna, MD  carvedilol  (COREG ) 6.25 MG tablet Take 1 tablet (6.25 mg total) by mouth 2 (two) times daily with a meal. 01/13/24   Delbert Clam, MD  isosorbide  mononitrate (IMDUR ) 30 MG 24 hr tablet Take 1 tablet (30 mg total) by mouth daily. 10/25/23   [name], MD  rosuvastatin  (CRESTOR ) 5 MG tablet Take 1 tablet (5 mg total) by mouth daily. 03/24/23     sucroferric oxyhydroxide (VELPHORO ) 500 MG chewable tablet Chew 3 tablets (1,500 mg total) by mouth 3 (three) times daily with meals. Patient not taking: Reported on 01/13/2024 07/29/23   Tawkaliyar, Roya, DO    Allergies: Penicillin g    Review of Systems  All other systems reviewed and are negative.   Updated Vital Signs BP (!) 140/91 (BP Location: Left Arm)   Pulse 91   Temp 98.2 F (36.8 C) (Oral)   Resp 16   SpO2 98%   Physical Exam Vitals and nursing note reviewed.  Constitutional:      General: He is not in acute distress.    Appearance: Normal appearance. He is well-developed. He is not toxic-appearing.  HENT:     Head: Normocephalic and atraumatic.  Eyes:     General: Lids are normal.     Conjunctiva/sclera: Conjunctivae normal.     Pupils: Pupils are equal, round, and reactive to light.  Neck:     Thyroid: No thyroid mass.     Trachea: No tracheal deviation.  Cardiovascular:     Rate and Rhythm: Normal rate and regular rhythm.     Heart sounds: Normal heart sounds.  No murmur heard.    No gallop.  Pulmonary:     Effort: Pulmonary effort is normal. No respiratory distress.     Breath sounds: Normal breath sounds. No stridor. No decreased breath sounds, wheezing, rhonchi or rales.  Abdominal:     General: There is no distension.     Palpations: Abdomen is soft.     Tenderness: There is no abdominal tenderness. There is no rebound.  Musculoskeletal:        General: No tenderness. Normal range of motion.     Cervical back: Normal range of motion and neck supple.  Skin:    General: Skin is warm and dry.     Findings: No abrasion or rash.  Neurological:     Mental Status: He is alert and oriented to person, place, and time. Mental status is at baseline.     GCS: GCS eye subscore is 4. GCS verbal subscore is 5. GCS motor subscore is 6.     Cranial Nerves: No cranial nerve deficit.     Sensory: No sensory deficit.     Motor: Motor function is intact.  Psychiatric:        Attention and Perception: Attention normal.        Speech: Speech normal.  Behavior: Behavior normal.     (all labs ordered are listed, but only abnormal results are displayed) Labs Reviewed - No data to display  EKG: None  Radiology: No results found.   Procedures   Medications Ordered in the ED  Chlorhexidine  Gluconate Cloth 2 % PADS 6 each (has no administration in time range)                                    Medical Decision Making  Patient stable this time.  Will consult nephrology for dialysis     Final diagnoses:  None    ED Discharge Orders     None          Dasie Faden, MD 01/14/24 1010

## 2024-01-14 NOTE — ED Notes (Signed)
 Pt returned from dialysis; declined to stay for discharge paperwork; pt stable, alert, oriented, ambulatory with steady gait

## 2024-01-14 NOTE — Progress Notes (Signed)
 Discussed with Dr. Norine. Patient's has been having prolong bleeding from his R AVF. Noted last shuntogram performed on 10/09/23 by Dr. Sheree. Consulted [facility] today for F'gram.  Charmaine Piety, NP [facility]

## 2024-01-14 NOTE — Progress Notes (Signed)
 Mr. Degroat is here for dialysis. Pt was discharged from his outpatient unit for behavior issues. The plan will be for ED HD. Pt is not to be admitted at this time. Pt will go to the dialysis unit when they are ready for the patient. When dialysis is completed pt will be sent back to ED for reassessment.   Last OP HD unit info -->  from 07/2023 --> 4h  B400 76.5kg  2K bath RUA AVF Heparin  2400    Clinical issues other than volume: 1) Anemia of esrd:  - pt dose not have a nephrologist or HD unit so does not have access to ESA or IV iron , other than when here for hospital HD sessions - last tsat 24% and ferritin 993 on 12/12/23, consider IV Fe - recent Hb around 10   - last 3 doses of darbepoeitin here -->   - 150 mcg 8/11 - 150 mcg 8/18 - 150 mcg 12/29/23  Charmaine Piety, NP [facility]

## 2024-01-14 NOTE — ED Triage Notes (Signed)
 Patient is here for his routine hemodialysis treatment , no other complaints , respirations unlabored / ambulatory .

## 2024-01-15 ENCOUNTER — Ambulatory Visit: Admitting: Internal Medicine

## 2024-01-16 ENCOUNTER — Ambulatory Visit (HOSPITAL_COMMUNITY): Admission: RE | Admit: 2024-01-16 | Source: Home / Self Care | Admitting: Vascular Surgery

## 2024-01-16 ENCOUNTER — Encounter (HOSPITAL_COMMUNITY): Payer: Self-pay

## 2024-01-16 ENCOUNTER — Emergency Department (HOSPITAL_COMMUNITY)
Admission: EM | Admit: 2024-01-16 | Discharge: 2024-01-16 | Disposition: A | Discharge status: Home / Self Care | Attending: Emergency Medicine | Admitting: Emergency Medicine

## 2024-01-16 DIAGNOSIS — N186 End stage renal disease: Secondary | ICD-10-CM | POA: Diagnosis not present

## 2024-01-16 DIAGNOSIS — Z79899 Other long term (current) drug therapy: Secondary | ICD-10-CM | POA: Insufficient documentation

## 2024-01-16 DIAGNOSIS — Z992 Dependence on renal dialysis: Secondary | ICD-10-CM | POA: Diagnosis not present

## 2024-01-16 DIAGNOSIS — I120 Hypertensive chronic kidney disease with stage 5 chronic kidney disease or end stage renal disease: Secondary | ICD-10-CM | POA: Diagnosis not present

## 2024-01-16 LAB — HEPATITIS B SURFACE ANTIGEN: Hepatitis B Surface Ag: NONREACTIVE

## 2024-01-16 MED ORDER — HEPARIN SODIUM (PORCINE) 1000 UNIT/ML DIALYSIS
2400.0000 [IU] | Freq: Once | INTRAMUSCULAR | Status: AC
  Administered 2024-01-16: 2400 [IU] via INTRAVENOUS_CENTRAL

## 2024-01-16 MED ORDER — HEPARIN SODIUM (PORCINE) 1000 UNIT/ML IJ SOLN
INTRAMUSCULAR | Status: AC
  Filled 2024-01-16: qty 3

## 2024-01-16 MED ORDER — CHLORHEXIDINE GLUCONATE CLOTH 2 % EX PADS
6.0000 | MEDICATED_PAD | Freq: Every day | CUTANEOUS | Status: DC
Start: 2024-01-16 — End: 2024-01-17

## 2024-01-16 NOTE — ED Notes (Signed)
 Pt returned from dialysis, no complaints at this time. Dried blood noted to bandage over fistula site. Instructed pt to check his bandage at home to ensure no further bleeding. Pt gathered belongings and ambulated to bus stop

## 2024-01-16 NOTE — Progress Notes (Signed)
 Received patient in bed to unit.  Alert and oriented.  Informed consent signed and in chart.   TX duration:3.5 hours  Patient tolerated well.  Transported back to the room  Alert, without acute distress.  Hand-off given to patient's nurse.   Access used: Right AV graft upper arm Access issues: none  Total UF removed: 2L Medication(s) given: none   01/16/24 1924  Vitals  Temp 97.9 F (36.6 C)  BP (!) 141/74  MAP (mmHg) 94  Pulse Rate 84  ECG Heart Rate 86  Resp 14  Oxygen Therapy  SpO2 98 %  O2 Device Room Air  During Treatment Monitoring  Duration of HD Treatment -hour(s) 3.5 hour(s)  HD Safety Checks Performed Yes  Intra-Hemodialysis Comments Tx completed  Post Treatment  Dialyzer Clearance Lightly streaked  Liters Processed 73.5  Fluid Removed (mL) 2000 mL  Tolerated HD Treatment Yes  AVG/AVF Arterial Site Held (minutes) 7 minutes  AVG/AVF Venous Site Held (minutes) 7 minutes  Fistula / Graft Right Upper arm Arteriovenous fistula  No placement date or time found.   Placed prior to admission: Yes  Orientation: Right  Access Location: Upper arm  Access Type: Arteriovenous fistula  Site Condition No complications  Fistula / Graft Assessment Present;Thrill;Bruit  Status Patent  Drainage Description None     Camellia Brasil LPN [facility]

## 2024-01-16 NOTE — ED Triage Notes (Signed)
 Pt is here for routine hemodialysis treatment. No complaints, no signs of distress, equal bilateral chest expansion observed.

## 2024-01-16 NOTE — ED Provider Notes (Signed)
  [place] EMERGENCY DEPARTMENT AT [facility] Provider Note   CSN: [id number] Arrival date & time: 01/16/24  9353     Patient presents with: No chief complaint on file.   Paul [name] is a 58 y.o. male.   58 year old male here today for his routine hemodialysis.        Prior to Admission medications   Medication Sig Start Date End Date Taking? Authorizing Provider  ARIPiprazole  (ABILIFY ) 5 MG tablet Take 1 tablet (5 mg total) by mouth at bedtime. 10/28/23   Plovsky, Elna, MD  carvedilol  (COREG ) 6.25 MG tablet Take 1 tablet (6.25 mg total) by mouth 2 (two) times daily with a meal. 01/13/24   Delbert Clam, MD  isosorbide  mononitrate (IMDUR ) 30 MG 24 hr tablet Take 1 tablet (30 mg total) by mouth daily. 10/25/23   [name], MD  rosuvastatin  (CRESTOR ) 5 MG tablet Take 1 tablet (5 mg total) by mouth daily. 03/24/23     sucroferric oxyhydroxide (VELPHORO ) 500 MG chewable tablet Chew 3 tablets (1,500 mg total) by mouth 3 (three) times daily with meals. Patient not taking: Reported on 01/13/2024 07/29/23   Tawkaliyar, Roya, DO    Allergies: Penicillin g    Review of Systems  Updated Vital Signs BP (!) 160/97   Pulse 84   Temp 97.7 F (36.5 C)   Resp 18   Ht 5' 3 (1.6 m)   Wt 81.2 kg   SpO2 100%   BMI 31.71 kg/m   Physical Exam Vitals and nursing note reviewed.  Cardiovascular:     Rate and Rhythm: Normal rate.  Abdominal:     General: Abdomen is flat.  Musculoskeletal:     Cervical back: Normal range of motion.  Skin:    General: Skin is warm.  Neurological:     General: No focal deficit present.     (all labs ordered are listed, but only abnormal results are displayed) Labs Reviewed - No data to display  EKG: None  Radiology: No results found.   Procedures   Medications Ordered in the ED  Chlorhexidine  Gluconate Cloth 2 % PADS 6 each (6 each Topical Not Given 01/16/24 1024)                                    Medical  Decision Making 58 year old male here today for routine hemodialysis.  Plan-reached out to the hemodialysis team.  He is scheduled for this afternoon.  When patient returns from hemodialysis, in the absence of event while at dialysis, patient is appropriate for discharge.        Final diagnoses:  None    ED Discharge Orders     None          Mannie Fairy DASEN, DO 01/16/24 1046

## 2024-01-17 LAB — HEPATITIS B SURFACE ANTIBODY, QUANTITATIVE: Hep B S AB Quant (Post): 6680 m[IU]/mL

## 2024-01-19 ENCOUNTER — Emergency Department (HOSPITAL_COMMUNITY): Admission: EM | Admit: 2024-01-19 | Discharge: 2024-01-20 | Discharge status: Against Medical Advice

## 2024-01-19 ENCOUNTER — Encounter (HOSPITAL_COMMUNITY): Payer: Self-pay | Admitting: *Deleted

## 2024-01-19 ENCOUNTER — Other Ambulatory Visit: Payer: Self-pay

## 2024-01-19 DIAGNOSIS — Z79899 Other long term (current) drug therapy: Secondary | ICD-10-CM | POA: Diagnosis not present

## 2024-01-19 DIAGNOSIS — I120 Hypertensive chronic kidney disease with stage 5 chronic kidney disease or end stage renal disease: Secondary | ICD-10-CM | POA: Diagnosis not present

## 2024-01-19 DIAGNOSIS — Z5329 Procedure and treatment not carried out because of patient's decision for other reasons: Secondary | ICD-10-CM | POA: Insufficient documentation

## 2024-01-19 DIAGNOSIS — I10 Essential (primary) hypertension: Secondary | ICD-10-CM | POA: Diagnosis not present

## 2024-01-19 DIAGNOSIS — Z992 Dependence on renal dialysis: Secondary | ICD-10-CM | POA: Diagnosis not present

## 2024-01-19 DIAGNOSIS — N186 End stage renal disease: Secondary | ICD-10-CM | POA: Insufficient documentation

## 2024-01-19 DIAGNOSIS — H3410 Central retinal artery occlusion, unspecified eye: Secondary | ICD-10-CM | POA: Diagnosis not present

## 2024-01-19 DIAGNOSIS — E669 Obesity, unspecified: Secondary | ICD-10-CM | POA: Diagnosis not present

## 2024-01-19 MED ORDER — LIDOCAINE-PRILOCAINE 2.5-2.5 % EX CREA: 1.0000 | TOPICAL_CREAM | CUTANEOUS | Status: DC | PRN

## 2024-01-19 MED ORDER — PENTAFLUOROPROP-TETRAFLUOROETH EX AERO: 1.0000 | INHALATION_SPRAY | CUTANEOUS | Status: DC | PRN

## 2024-01-19 MED ORDER — HEPARIN SODIUM (PORCINE) 1000 UNIT/ML DIALYSIS: 1000.0000 [IU] | INTRAMUSCULAR | Status: DC | PRN

## 2024-01-19 MED ORDER — HEPARIN SODIUM (PORCINE) 1000 UNIT/ML DIALYSIS: 2400.0000 [IU] | Freq: Once | INTRAMUSCULAR | Status: DC

## 2024-01-19 MED ORDER — NEPRO/CARBSTEADY PO LIQD: 237.0000 mL | ORAL | Status: DC | PRN

## 2024-01-19 MED ORDER — CHLORHEXIDINE GLUCONATE CLOTH 2 % EX PADS
6.0000 | MEDICATED_PAD | Freq: Every day | CUTANEOUS | Status: DC
Start: 2024-01-19 — End: 2024-01-20

## 2024-01-19 MED ORDER — LIDOCAINE HCL (PF) 1 % IJ SOLN: 5.0000 mL | INTRAMUSCULAR | Status: DC | PRN

## 2024-01-19 NOTE — ED Notes (Signed)
 Pt left prior to being given discharge papers

## 2024-01-19 NOTE — ED Triage Notes (Signed)
 Patient is here for dialysis , no other complaints

## 2024-01-19 NOTE — ED Provider Notes (Signed)
 [place] EMERGENCY DEPARTMENT AT [facility] Provider Note   CSN: [id number] Arrival date & time: 01/19/24  9352     Patient presents with: Vascular Access Problem   Paul [name] is a 58 y.o. male with past medical history seen for obesity, hypertension, ESRD on dialysis, schizophrenia who is here for routine dialysis.  No other complaints today.   HPI     Prior to Admission medications   Medication Sig Start Date End Date Taking? Authorizing Provider  ARIPiprazole  (ABILIFY ) 5 MG tablet Take 1 tablet (5 mg total) by mouth at bedtime. 10/28/23   Plovsky, Elna, MD  carvedilol  (COREG ) 6.25 MG tablet Take 1 tablet (6.25 mg total) by mouth 2 (two) times daily with a meal. 01/13/24   Delbert Clam, MD  isosorbide  mononitrate (IMDUR ) 30 MG 24 hr tablet Take 1 tablet (30 mg total) by mouth daily. 10/25/23   [name], MD  rosuvastatin  (CRESTOR ) 5 MG tablet Take 1 tablet (5 mg total) by mouth daily. 03/24/23     sucroferric oxyhydroxide (VELPHORO ) 500 MG chewable tablet Chew 3 tablets (1,500 mg total) by mouth 3 (three) times daily with meals. Patient not taking: Reported on 01/13/2024 07/29/23   Tawkaliyar, Roya, DO    Allergies: Penicillin g    Review of Systems  All other systems reviewed and are negative.   Updated Vital Signs BP 138/80 (BP Location: Left Arm)   Pulse 66   Temp 98.4 F (36.9 C)   Resp 14   Ht 5' 3 (1.6 m)   Wt 81 kg   SpO2 98%   BMI 31.63 kg/m   Physical Exam Vitals and nursing note reviewed.  Constitutional:      General: He is not in acute distress.    Appearance: Normal appearance.  HENT:     Head: Normocephalic and atraumatic.  Eyes:     General:        Right eye: No discharge.        Left eye: No discharge.  Cardiovascular:     Rate and Rhythm: Normal rate and regular rhythm.     Heart sounds: No murmur heard.    No friction rub. No gallop.  Pulmonary:     Effort: Pulmonary effort is normal.     Breath sounds:  Normal breath sounds.  Abdominal:     General: Bowel sounds are normal.     Palpations: Abdomen is soft.  Skin:    General: Skin is warm and dry.     Capillary Refill: Capillary refill takes less than 2 seconds.  Neurological:     Mental Status: He is alert and oriented to person, place, and time.  Psychiatric:        Mood and Affect: Mood normal.        Behavior: Behavior normal.     (all labs ordered are listed, but only abnormal results are displayed) Labs Reviewed - No data to display  EKG: None  Radiology: No results found.   Procedures   Medications Ordered in the ED  Chlorhexidine  Gluconate Cloth 2 % PADS 6 each (has no administration in time range)  pentafluoroprop-tetrafluoroeth (GEBAUERS) aerosol 1 Application (has no administration in time range)  lidocaine  (PF) (XYLOCAINE ) 1 % injection 5 mL (has no administration in time range)  lidocaine -prilocaine  (EMLA ) cream 1 Application (has no administration in time range)  feeding supplement (NEPRO CARB STEADY) liquid 237 mL (has no administration in time range)  heparin  injection 1,000 Units (has  no administration in time range)  heparin  injection 2,400 Units (has no administration in time range)                                    Medical Decision Making  Well-known patient to the department here for routine dialysis, he is on a Monday Wednesday Friday schedule.  I spoke with Dr. Susannah with nephrology who accepts patient for dialysis.  After he is dialyzed he will be stable for discharge.  He denies any shortness of breath, or other symptoms.  Will plan for discharge home after his dialysis is completed.  Final diagnoses:  None    ED Discharge Orders     None          Rosan Sherlean DEL, PA-C 01/19/24 1253    [name], [place] 01/19/24 1530

## 2024-01-19 NOTE — Procedures (Signed)
 Asked to see this patient for hospital dialysis. Pt was discharged from his outpatient unit for behavior issues. The plan will be for ED HD. Pt is not to be admitted at this time. Pt will go to the dialysis unit when they are ready for the patient. When dialysis is completed pt will be sent back to ED for reassessment.   Vitals:   01/19/24 1423 01/19/24 1512 01/19/24 1530 01/19/24 1600  BP: 125/87 (!) 135/95 (!) 140/77 (!) 135/93  Pulse: 64 71 68 71  Resp: 14 16 15 15   Temp: (!) 97.5 F (36.4 C)     TempSrc: Oral     SpO2:  100% 100% 100%  Weight: 83.6 kg     Height:           I was present at the procedure, reviewed the HD regimen and made appropriate changes.   Myer Fret MD  [facility] 01/19/2024, 5:40 PM

## 2024-01-21 ENCOUNTER — Emergency Department (HOSPITAL_COMMUNITY)
Admission: EM | Admit: 2024-01-21 | Discharge: 2024-01-21 | Disposition: A | Discharge status: Home / Self Care | Attending: Nephrology | Admitting: Nephrology

## 2024-01-21 DIAGNOSIS — Z992 Dependence on renal dialysis: Secondary | ICD-10-CM | POA: Insufficient documentation

## 2024-01-21 MED ORDER — PENTAFLUOROPROP-TETRAFLUOROETH EX AERO: 1.0000 | INHALATION_SPRAY | CUTANEOUS | Status: DC | PRN

## 2024-01-21 MED ORDER — HEPARIN SODIUM (PORCINE) 1000 UNIT/ML DIALYSIS: 1000.0000 [IU] | INTRAMUSCULAR | Status: DC | PRN

## 2024-01-21 MED ORDER — ALTEPLASE 2 MG IJ SOLR: 2.0000 mg | Freq: Once | INTRAMUSCULAR | Status: DC | PRN

## 2024-01-21 MED ORDER — ANTICOAGULANT SODIUM CITRATE 4% (200MG/5ML) IV SOLN: 5.0000 mL | Status: DC | PRN

## 2024-01-21 MED ORDER — LIDOCAINE-PRILOCAINE 2.5-2.5 % EX CREA: 1.0000 | TOPICAL_CREAM | CUTANEOUS | Status: DC | PRN

## 2024-01-21 MED ORDER — HEPARIN SODIUM (PORCINE) 1000 UNIT/ML DIALYSIS: 100.0000 [IU]/kg | INTRAMUSCULAR | Status: DC | PRN

## 2024-01-21 MED ORDER — CHLORHEXIDINE GLUCONATE CLOTH 2 % EX PADS: 6.0000 | MEDICATED_PAD | Freq: Every day | CUTANEOUS | Status: DC

## 2024-01-21 MED ORDER — LIDOCAINE HCL (PF) 1 % IJ SOLN: 5.0000 mL | INTRAMUSCULAR | Status: DC | PRN

## 2024-01-21 MED ORDER — HEPARIN SODIUM (PORCINE) 1000 UNIT/ML DIALYSIS: 2000.0000 [IU] | Freq: Once | INTRAMUSCULAR | Status: DC

## 2024-01-21 MED ORDER — NEPRO/CARBSTEADY PO LIQD: 237.0000 mL | ORAL | Status: DC | PRN

## 2024-01-21 NOTE — Progress Notes (Signed)
 Received patient in bed.Alert and oriented x 4. He signed his treatment 's consent.  Access used: Right arm avf that worked well.  Duration of treatment: 3 hours.  Uf goal: Met  2 liters, tolerated treatment.  Hand off to the patient's nurse,back via transport for discharge.

## 2024-01-21 NOTE — ED Triage Notes (Signed)
 Patient is here for his routine hemodialysis treatment . No other complaints . Respirations unlabored .

## 2024-01-21 NOTE — ED Notes (Signed)
 Report given to Lyle, Stated be here around lunch for dialysis

## 2024-01-23 ENCOUNTER — Other Ambulatory Visit: Payer: Self-pay

## 2024-01-23 ENCOUNTER — Encounter (HOSPITAL_COMMUNITY): Payer: Self-pay

## 2024-01-23 ENCOUNTER — Emergency Department (HOSPITAL_COMMUNITY): Admission: EM | Admit: 2024-01-23 | Discharge: 2024-01-23 | Disposition: A | Discharge status: Home / Self Care

## 2024-01-23 DIAGNOSIS — I120 Hypertensive chronic kidney disease with stage 5 chronic kidney disease or end stage renal disease: Secondary | ICD-10-CM | POA: Diagnosis not present

## 2024-01-23 DIAGNOSIS — Z79899 Other long term (current) drug therapy: Secondary | ICD-10-CM | POA: Diagnosis not present

## 2024-01-23 DIAGNOSIS — N186 End stage renal disease: Secondary | ICD-10-CM | POA: Insufficient documentation

## 2024-01-23 DIAGNOSIS — Z992 Dependence on renal dialysis: Secondary | ICD-10-CM | POA: Diagnosis not present

## 2024-01-23 DIAGNOSIS — D631 Anemia in chronic kidney disease: Secondary | ICD-10-CM | POA: Insufficient documentation

## 2024-01-23 MED ORDER — ALTEPLASE 2 MG IJ SOLR: 2.0000 mg | Freq: Once | INTRAMUSCULAR | Status: DC | PRN

## 2024-01-23 MED ORDER — ANTICOAGULANT SODIUM CITRATE 4% (200MG/5ML) IV SOLN: 5.0000 mL | Status: DC | PRN

## 2024-01-23 MED ORDER — DARBEPOETIN ALFA 100 MCG/0.5ML IJ SOSY
100.0000 ug | PREFILLED_SYRINGE | Freq: Once | INTRAMUSCULAR | Status: AC
Start: 2024-01-23 — End: 2024-01-23
  Administered 2024-01-23: 100 ug via SUBCUTANEOUS
  Filled 2024-01-23: qty 0.5

## 2024-01-23 MED ORDER — NEPRO/CARBSTEADY PO LIQD: 237.0000 mL | ORAL | Status: DC | PRN

## 2024-01-23 MED ORDER — HEPARIN SODIUM (PORCINE) 1000 UNIT/ML DIALYSIS: 2400.0000 [IU] | Freq: Once | INTRAMUSCULAR | Status: DC

## 2024-01-23 MED ORDER — LIDOCAINE-PRILOCAINE 2.5-2.5 % EX CREA: 1.0000 | TOPICAL_CREAM | CUTANEOUS | Status: DC | PRN

## 2024-01-23 MED ORDER — LIDOCAINE HCL (PF) 1 % IJ SOLN: 5.0000 mL | INTRAMUSCULAR | Status: DC | PRN

## 2024-01-23 MED ORDER — PENTAFLUOROPROP-TETRAFLUOROETH EX AERO: 1.0000 | INHALATION_SPRAY | CUTANEOUS | Status: DC | PRN

## 2024-01-23 MED ORDER — HEPARIN SODIUM (PORCINE) 1000 UNIT/ML DIALYSIS: 1000.0000 [IU] | INTRAMUSCULAR | Status: DC | PRN

## 2024-01-23 MED ORDER — CHLORHEXIDINE GLUCONATE CLOTH 2 % EX PADS: 6.0000 | MEDICATED_PAD | Freq: Every day | CUTANEOUS | Status: DC

## 2024-01-23 NOTE — ED Notes (Signed)
 Paul [name] from dialysis called to report that patient would be picked up in about an hour. Patient informed. Patient verbalized understanding.

## 2024-01-23 NOTE — ED Notes (Signed)
 Patient to dialysis via cart by transport.

## 2024-01-23 NOTE — ED Notes (Signed)
 Patient given warm blankets. Patient denies other needs at this time.

## 2024-01-23 NOTE — ED Notes (Signed)
 Patient sleeping soundly in bed with regular, unlabored breathing.

## 2024-01-23 NOTE — ED Notes (Signed)
 Patient sleeping in bed with regular, unlabored breathing.

## 2024-01-23 NOTE — ED Notes (Signed)
 Patient given food and drink per request. Patient is sitting up in bed eating and watching his phone. Patient appears to be in no distress. Patient denies other needs at this time while waiting to be picked up for his dialysis.

## 2024-01-23 NOTE — Progress Notes (Signed)
 Asked to see this patient for hospital dialysis. Pt was discharged from his outpatient unit for behavior issues. The plan will be for ED HD. Pt is not to be admitted at this time. Pt will go to the dialysis unit when they are ready for the patient. When dialysis is completed pt will be sent back to ED for reassessment.   Vitals:   01/23/24 0650 01/23/24 0701  BP: (!) 158/90   Pulse: 79   Resp: 16   Temp: 97.9 F (36.6 C)   SpO2: 98%   Weight:  79 kg  Height:  5' 3 (1.6 m)   Last OP HD unit info from 07/2023 -->  4h  B400 76.5kg  2K bath RUA AVF Heparin  2400    Clinical issues other than volume: 1) Anemia of esrd:  - pt dose not have a nephrologist or HD unit so does not have access to ESA or IV iron , other than when here for hospital HD sessions - last tsat 24% and ferritin 993 on 12/12/23, consider IV Fe - recent Hb around 10- 11 - last 3 doses of darbepoeitin here -->   - 150 mcg 8/11 - 150 mcg 8/18 - 150 mcg 8/25   Plan: ->  give lower dose of esa, darbe 100 mcg, while here today    I was present at the procedure, reviewed the HD regimen and made appropriate changes.   Myer Fret MD  [facility] 01/23/2024, 8:15 AM    No results for input(s): HGB, ALBUMIN, CALCIUM , PHOS, CREATININE, K in the last 168 hours. No results for input(s): IRON , TIBC, FERRITIN in the last 168 hours. Inpatient medications:

## 2024-01-23 NOTE — ED Provider Notes (Signed)
  Physical Exam  BP 130/82   Pulse 81   Temp 97.9 F (36.6 C) (Oral)   Resp 15   Ht 5' 3 (1.6 m)   Wt 79 kg   SpO2 97%   BMI 30.85 kg/m   Patient came back to the emergency department after routine dialysis.  I reviewed his vitals and they are normal.  Unfortunately, he eloped prior to my reevaluation.   Dionisio Blunt, MD 01/23/24 CELESTER    Patsey Lot, MD 01/23/24 [phone]

## 2024-01-23 NOTE — ED Triage Notes (Signed)
 Pt here for dialysis, last tx was Wednesday. No other complaints at this time.

## 2024-01-23 NOTE — ED Provider Notes (Signed)
 [place] EMERGENCY DEPARTMENT AT [facility] Provider Note   CSN: [id number] Arrival date & time: 01/23/24  9353     Patient presents with: Dialysis    Paul [name] is a 58 y.o. male with past medical history of obesity hypertension end-stage renal disease on dialysis is presented to emergency room for his routine dialysis.  He has no other complaints.  Last treatment was Wednesday.   HPI     Prior to Admission medications   Medication Sig Start Date End Date Taking? Authorizing Provider  ARIPiprazole  (ABILIFY ) 5 MG tablet Take 1 tablet (5 mg total) by mouth at bedtime. 10/28/23   Plovsky, Elna, MD  carvedilol  (COREG ) 6.25 MG tablet Take 1 tablet (6.25 mg total) by mouth 2 (two) times daily with a meal. 01/13/24   Delbert Clam, MD  isosorbide  mononitrate (IMDUR ) 30 MG 24 hr tablet Take 1 tablet (30 mg total) by mouth daily. 10/25/23   [name], MD  rosuvastatin  (CRESTOR ) 5 MG tablet Take 1 tablet (5 mg total) by mouth daily. 03/24/23     sucroferric oxyhydroxide (VELPHORO ) 500 MG chewable tablet Chew 3 tablets (1,500 mg total) by mouth 3 (three) times daily with meals. Patient not taking: Reported on 01/13/2024 07/29/23   Tawkaliyar, Roya, DO    Allergies: Penicillin g    Review of Systems  Constitutional:  Positive for activity change.    Updated Vital Signs BP (!) 158/90   Pulse 79   Temp 97.9 F (36.6 C)   Resp 16   Ht 5' 3 (1.6 m)   Wt 79 kg   SpO2 98%   BMI 30.85 kg/m   Physical Exam Vitals and nursing note reviewed.  Constitutional:      General: He is not in acute distress.    Appearance: He is not toxic-appearing.  HENT:     Head: Normocephalic and atraumatic.  Eyes:     General: No scleral icterus.    Conjunctiva/sclera: Conjunctivae normal.  Cardiovascular:     Rate and Rhythm: Normal rate and regular rhythm.     Pulses: Normal pulses.     Heart sounds: Normal heart sounds.  Pulmonary:     Effort: Pulmonary effort is  normal. No respiratory distress.     Breath sounds: Normal breath sounds.  Abdominal:     General: Abdomen is flat. Bowel sounds are normal.     Palpations: Abdomen is soft.     Tenderness: There is no abdominal tenderness.  Skin:    General: Skin is warm and dry.     Findings: No lesion.  Neurological:     General: No focal deficit present.     Mental Status: He is alert and oriented to person, place, and time. Mental status is at baseline.     (all labs ordered are listed, but only abnormal results are displayed) Labs Reviewed - No data to display  EKG: None  Radiology: No results found.   Procedures   Medications Ordered in the ED - No data to display                                  Medical Decision Making  This patient presents to the ED for concern of dialysis need, this involves an extensive number of treatment options, and is a complaint that carries with it a high risk of complications and morbidity.  The differential diagnosis includes emergent  dialysis, routine dialysis, electrolyte abnormality    Cardiac Monitoring: / EKG:  The patient was maintained on a cardiac monitor.     Problem List / ED Course / Critical interventions / Medication management  Patient is well-known to this department and here for routine dialysis.  Patient presents every Monday Wednesday Friday for such. Last here 01/21/24.  I will reach out to Dr. Geralynn nephrology to have dialysis.  Anticipate that patient will be stable for discharge after dialysis as he is not having any other complaints.  On arrival stable and well-appearing.        Final diagnoses:  ESRD (end stage renal disease) [facility])    ED Discharge Orders     None          Shermon Warren SAILOR, PA-C 01/23/24 9162    Simon Lavonia SAILOR, MD 01/25/24 [phone]

## 2024-01-26 ENCOUNTER — Encounter (HOSPITAL_COMMUNITY): Payer: Self-pay

## 2024-01-26 ENCOUNTER — Other Ambulatory Visit: Payer: Self-pay

## 2024-01-26 ENCOUNTER — Emergency Department (HOSPITAL_COMMUNITY)
Admission: EM | Admit: 2024-01-26 | Discharge: 2024-01-26 | Discharge status: Against Medical Advice | Attending: Emergency Medicine | Admitting: Emergency Medicine

## 2024-01-26 DIAGNOSIS — Z5329 Procedure and treatment not carried out because of patient's decision for other reasons: Secondary | ICD-10-CM | POA: Insufficient documentation

## 2024-01-26 DIAGNOSIS — Z992 Dependence on renal dialysis: Secondary | ICD-10-CM | POA: Insufficient documentation

## 2024-01-26 DIAGNOSIS — I120 Hypertensive chronic kidney disease with stage 5 chronic kidney disease or end stage renal disease: Secondary | ICD-10-CM | POA: Diagnosis not present

## 2024-01-26 DIAGNOSIS — N186 End stage renal disease: Secondary | ICD-10-CM | POA: Insufficient documentation

## 2024-01-26 LAB — CBC
HCT: 29 % — ABNORMAL LOW (ref 39.0–52.0)
Hemoglobin: 9.5 g/dL — ABNORMAL LOW (ref 13.0–17.0)
MCH: 29.8 pg (ref 26.0–34.0)
MCHC: 32.8 g/dL (ref 30.0–36.0)
MCV: 90.9 fL (ref 80.0–100.0)
Platelets: 217 K/uL (ref 150–400)
RBC: 3.19 MIL/uL — ABNORMAL LOW (ref 4.22–5.81)
RDW: 16.4 % — ABNORMAL HIGH (ref 11.5–15.5)
WBC: 7 K/uL (ref 4.0–10.5)
nRBC: 0 % (ref 0.0–0.2)

## 2024-01-26 LAB — RENAL FUNCTION PANEL
Albumin: 3.4 g/dL — ABNORMAL LOW (ref 3.5–5.0)
Anion gap: 16 — ABNORMAL HIGH (ref 5–15)
BUN: 82 mg/dL — ABNORMAL HIGH (ref 6–20)
CO2: 21 mmol/L — ABNORMAL LOW (ref 22–32)
Calcium: 7.2 mg/dL — ABNORMAL LOW (ref 8.9–10.3)
Chloride: 98 mmol/L (ref 98–111)
Creatinine, Ser: 13.64 mg/dL — ABNORMAL HIGH (ref 0.61–1.24)
GFR, Estimated: 4 mL/min — ABNORMAL LOW (ref >60)
Glucose, Bld: 107 mg/dL — ABNORMAL HIGH (ref 70–99)
Phosphorus: 8.9 mg/dL — ABNORMAL HIGH (ref 2.5–4.6)
Potassium: 4.6 mmol/L (ref 3.5–5.1)
Sodium: 135 mmol/L (ref 135–145)

## 2024-01-26 MED ORDER — LIDOCAINE-PRILOCAINE 2.5-2.5 % EX CREA: 1.0000 | TOPICAL_CREAM | CUTANEOUS | Status: DC | PRN

## 2024-01-26 MED ORDER — LIDOCAINE HCL (PF) 1 % IJ SOLN: 5.0000 mL | INTRAMUSCULAR | Status: DC | PRN

## 2024-01-26 MED ORDER — PENTAFLUOROPROP-TETRAFLUOROETH EX AERO: 1.0000 | INHALATION_SPRAY | CUTANEOUS | Status: DC | PRN

## 2024-01-26 MED ORDER — HEPARIN SODIUM (PORCINE) 1000 UNIT/ML IJ SOLN
INTRAMUSCULAR | Status: AC
  Filled 2024-01-26: qty 4

## 2024-01-26 MED ORDER — CHLORHEXIDINE GLUCONATE CLOTH 2 % EX PADS: 6.0000 | MEDICATED_PAD | Freq: Every day | CUTANEOUS | Status: DC

## 2024-01-26 MED ORDER — HEPARIN SODIUM (PORCINE) 1000 UNIT/ML DIALYSIS
40.0000 [IU]/kg | INTRAMUSCULAR | Status: DC | PRN
  Administered 2024-01-26: 3200 [IU] via INTRAVENOUS_CENTRAL
  Filled 2024-01-26: qty 4

## 2024-01-26 NOTE — Progress Notes (Signed)
 Patient ID: Paul [name], male   DOB: 1965-06-04, 58 y.o.   MRN: [id number] I was consulted by the ED provider to provide hemodialysis for this patient who has been discharged from his outpatient dialysis unit (threatening behavior). I have ordered for hemodialysis to be done through the ED dialysis orders and the patient will undergo hemodialysis with discharge back to the emergency room for triage and discharge.  Gordy Blanch MD [facility]. Office # [phone] Pager # [phone] 8:14 AM

## 2024-01-26 NOTE — ED Triage Notes (Signed)
 Pt here for dialysis, last tx was friday

## 2024-01-26 NOTE — ED Provider Notes (Signed)
 [place] EMERGENCY DEPARTMENT AT [facility] Provider Note   CSN: [id number] Arrival date & time: 01/26/24  9349     Patient presents with: No chief complaint on file.   Xayne Brumbaugh Bortle is a 58 y.o. male ESRD MWF presents to the ER today for dialysis. Patient is well known to the ER. Last session was on Friday. No complaints. Denies fever, chest pain, or SOB. Feels at his baseline of health.  HPI     Prior to Admission medications   Medication Sig Start Date End Date Taking? Authorizing Provider  ARIPiprazole  (ABILIFY ) 5 MG tablet Take 1 tablet (5 mg total) by mouth at bedtime. 10/28/23   Plovsky, Elna, MD  carvedilol  (COREG ) 6.25 MG tablet Take 1 tablet (6.25 mg total) by mouth 2 (two) times daily with a meal. 01/13/24   Delbert Clam, MD  isosorbide  mononitrate (IMDUR ) 30 MG 24 hr tablet Take 1 tablet (30 mg total) by mouth daily. 10/25/23   [name], MD  OVER THE COUNTER MEDICATION Take 1 capsule by mouth daily. Lion's Mane for memory    [provider]  rosuvastatin  (CRESTOR ) 5 MG tablet Take 1 tablet (5 mg total) by mouth daily. 03/24/23     sucroferric oxyhydroxide (VELPHORO ) 500 MG chewable tablet Chew 3 tablets (1,500 mg total) by mouth 3 (three) times daily with meals. 07/29/23   Tawkaliyar, Roya, DO    Allergies: Penicillin g    Review of Systems  Constitutional:  Negative for fever.  Respiratory:  Negative for shortness of breath.   Cardiovascular:  Negative for chest pain.    Updated Vital Signs BP (!) 153/87   Pulse 77   Temp 98.1 F (36.7 C)   Resp (!) 24   SpO2 97%   Physical Exam Constitutional:      General: He is not in acute distress.    Appearance: He is not ill-appearing or toxic-appearing.     Comments: On phone in no acute distress  Eyes:     Comments: Chronically cloudy right eye  Cardiovascular:     Rate and Rhythm: Normal rate.  Pulmonary:     Effort: Pulmonary effort is normal. No respiratory distress.      Breath sounds: Normal breath sounds.  Skin:    General: Skin is warm and dry.  Neurological:     Mental Status: He is alert.     (all labs ordered are listed, but only abnormal results are displayed) Labs Reviewed - No data to display  EKG: None  Radiology: No results found.  Procedures   Medications Ordered in the ED - No data to display  Clinical Course as of 01/26/24 0949  [facility] Jan 26, 2024  0804 Spoke with Dr. Tobie with nephrology who will place orders for dialysis.   [RR]    Clinical Course User Index [RR] Bernis Ernst, PA-C   Medical Decision Making  58 y.o. male presents to the ER for evaluation of dialysis. Vital signs unremarkable. Physical exam as noted above.   Patient's last visit was on 01/22/1994. He has no complaints and is hemodynamically stable. I have consulted nephrology and spoke with Dr. Tobie with nephrology who will put in orders for dialysis.   Patient to dialysis. Anticipate discharge home unless dialysis complication.   Portions of this report may have been transcribed using voice recognition software. Every effort was made to ensure accuracy; however, inadvertent computerized transcription errors may be present.    Final diagnoses:  ESRD  on hemodialysis [facility])    ED Discharge Orders     None          Bernis Ernst, DEVONNA 01/26/24 1339    Patsey Lot, MD 01/26/24 [phone]

## 2024-01-26 NOTE — Progress Notes (Signed)
   01/26/24 1719  Vitals  Temp 97.8 F (36.6 C)  Pulse Rate 76  Resp 17  BP 112/85  SpO2 93 %  Weight  (stretcher)  Post Treatment  Dialyzer Clearance Lightly streaked  Hemodialysis Intake (mL) 0 mL  Liters Processed 90  Fluid Removed (mL) 2500 mL  AVG/AVF Arterial Site Held (minutes) 10 minutes  AVG/AVF Venous Site Held (minutes) 10 minutes   Received patient in bed to unit.  Alert and oriented.  Informed consent signed and in chart.   TX duration:3.75hrs  Patient tolerated well.  Transported back to the room  Alert, without acute distress.  Hand-off given to patient's nurse.   Access used: RAVF Access issues: none  Total UF removed: 2.5L Medication(s) given: none    Na'Shaminy T [facility]

## 2024-01-28 ENCOUNTER — Emergency Department (HOSPITAL_COMMUNITY)
Admission: EM | Admit: 2024-01-28 | Discharge: 2024-01-28 | Discharge status: Against Medical Advice | Attending: Emergency Medicine | Admitting: Emergency Medicine

## 2024-01-28 ENCOUNTER — Other Ambulatory Visit: Payer: Self-pay

## 2024-01-28 DIAGNOSIS — Z452 Encounter for adjustment and management of vascular access device: Secondary | ICD-10-CM | POA: Diagnosis not present

## 2024-01-28 DIAGNOSIS — Z5321 Procedure and treatment not carried out due to patient leaving prior to being seen by health care provider: Secondary | ICD-10-CM | POA: Diagnosis not present

## 2024-01-28 NOTE — ED Triage Notes (Signed)
 Pt. Here for dialysis. Completed full treatment on Monday.

## 2024-01-28 NOTE — ED Notes (Signed)
 Pt decided to leave due to it being too late to go to dialysis won't be seen until 7p will come back Friday

## 2024-01-29 ENCOUNTER — Other Ambulatory Visit (HOSPITAL_COMMUNITY): Payer: Self-pay

## 2024-01-30 ENCOUNTER — Emergency Department (HOSPITAL_COMMUNITY): Admission: EM | Admit: 2024-01-30 | Discharge: 2024-01-31 | Discharge status: Against Medical Advice

## 2024-01-30 ENCOUNTER — Other Ambulatory Visit: Payer: Self-pay

## 2024-01-30 DIAGNOSIS — Z79899 Other long term (current) drug therapy: Secondary | ICD-10-CM | POA: Diagnosis not present

## 2024-01-30 DIAGNOSIS — I120 Hypertensive chronic kidney disease with stage 5 chronic kidney disease or end stage renal disease: Secondary | ICD-10-CM | POA: Diagnosis not present

## 2024-01-30 DIAGNOSIS — Z992 Dependence on renal dialysis: Secondary | ICD-10-CM | POA: Diagnosis not present

## 2024-01-30 DIAGNOSIS — Z5329 Procedure and treatment not carried out because of patient's decision for other reasons: Secondary | ICD-10-CM | POA: Diagnosis not present

## 2024-01-30 DIAGNOSIS — N186 End stage renal disease: Secondary | ICD-10-CM | POA: Diagnosis not present

## 2024-01-30 LAB — CBC
HCT: 28.8 % — ABNORMAL LOW (ref 39.0–52.0)
Hemoglobin: 9.5 g/dL — ABNORMAL LOW (ref 13.0–17.0)
MCH: 30.1 pg (ref 26.0–34.0)
MCHC: 33 g/dL (ref 30.0–36.0)
MCV: 91.1 fL (ref 80.0–100.0)
Platelets: 233 K/uL (ref 150–400)
RBC: 3.16 MIL/uL — ABNORMAL LOW (ref 4.22–5.81)
RDW: 16.7 % — ABNORMAL HIGH (ref 11.5–15.5)
WBC: 8.1 K/uL (ref 4.0–10.5)
nRBC: 0 % (ref 0.0–0.2)

## 2024-01-30 LAB — BASIC METABOLIC PANEL WITH GFR
Anion gap: 21 — ABNORMAL HIGH (ref 5–15)
BUN: 92 mg/dL — ABNORMAL HIGH (ref 6–20)
CO2: 18 mmol/L — ABNORMAL LOW (ref 22–32)
Calcium: 7.2 mg/dL — ABNORMAL LOW (ref 8.9–10.3)
Chloride: 98 mmol/L (ref 98–111)
Creatinine, Ser: 15.95 mg/dL — ABNORMAL HIGH (ref 0.61–1.24)
GFR, Estimated: 3 mL/min — ABNORMAL LOW (ref >60)
Glucose, Bld: 102 mg/dL — ABNORMAL HIGH (ref 70–99)
Potassium: 5 mmol/L (ref 3.5–5.1)
Sodium: 137 mmol/L (ref 135–145)

## 2024-01-30 MED ORDER — CHLORHEXIDINE GLUCONATE CLOTH 2 % EX PADS: 6.0000 | MEDICATED_PAD | Freq: Every day | CUTANEOUS | Status: DC

## 2024-01-30 NOTE — ED Notes (Signed)
Report given to dialysis nurse.

## 2024-01-30 NOTE — ED Provider Notes (Signed)
 [place] EMERGENCY DEPARTMENT AT [facility] Provider Note   CSN: [id number] Arrival date & time: 01/30/24  9364     Patient presents with: Vascular Access Problem   Paul [name] is a 58 y.o. male obesity, hypertension, ESRD on dialysis, schizophrenia who is here for routine dialysis. No other complaints today.  Reports that he did miss session on Wednesday because he could not wait.   HPI     Prior to Admission medications   Medication Sig Start Date End Date Taking? Authorizing Provider  ARIPiprazole  (ABILIFY ) 5 MG tablet Take 1 tablet (5 mg total) by mouth at bedtime. 10/28/23   Plovsky, Elna, MD  carvedilol  (COREG ) 6.25 MG tablet Take 1 tablet (6.25 mg total) by mouth 2 (two) times daily with a meal. 01/13/24   Delbert Clam, MD  isosorbide  mononitrate (IMDUR ) 30 MG 24 hr tablet Take 1 tablet (30 mg total) by mouth daily. 10/25/23   [name], MD  OVER THE COUNTER MEDICATION Take 1 capsule by mouth daily. Lion's Mane for memory    [provider]  rosuvastatin  (CRESTOR ) 5 MG tablet Take 1 tablet (5 mg total) by mouth daily. 03/24/23     sucroferric oxyhydroxide (VELPHORO ) 500 MG chewable tablet Chew 3 tablets (1,500 mg total) by mouth 3 (three) times daily with meals. 07/29/23   Tawkaliyar, Roya, DO    Allergies: Penicillin g    Review of Systems  All other systems reviewed and are negative.   Updated Vital Signs BP (!) 150/89 (BP Location: Left Arm)   Pulse 72   Temp 97.8 F (36.6 C)   Resp 16   SpO2 98%   Physical Exam Vitals and nursing note reviewed.  Constitutional:      General: He is not in acute distress.    Appearance: Normal appearance.  HENT:     Head: Normocephalic and atraumatic.  Eyes:     General:        Right eye: No discharge.        Left eye: No discharge.  Cardiovascular:     Rate and Rhythm: Normal rate and regular rhythm.     Heart sounds: No murmur heard.    No friction rub. No gallop.  Pulmonary:      Effort: Pulmonary effort is normal.     Breath sounds: Normal breath sounds.  Abdominal:     General: Bowel sounds are normal.     Palpations: Abdomen is soft.  Skin:    General: Skin is warm and dry.     Capillary Refill: Capillary refill takes less than 2 seconds.  Neurological:     Mental Status: He is alert and oriented to person, place, and time.  Psychiatric:        Mood and Affect: Mood normal.        Behavior: Behavior normal.     (all labs ordered are listed, but only abnormal results are displayed) Labs Reviewed  CBC - Abnormal; Notable for the following components:      Result Value   RBC 3.16 (*)    Hemoglobin 9.5 (*)    HCT 28.8 (*)    RDW 16.7 (*)    All other components within normal limits  BASIC METABOLIC PANEL WITH GFR - Abnormal; Notable for the following components:   CO2 18 (*)    Glucose, Bld 102 (*)    BUN 92 (*)    Creatinine, Ser 15.95 (*)    Calcium  7.2 (*)  GFR, Estimated 3 (*)    Anion gap 21 (*)    All other components within normal limits    EKG: None  Radiology: No results found.   Procedures   Medications Ordered in the ED  Chlorhexidine  Gluconate Cloth 2 % PADS 6 each (has no administration in time range)                                    Medical Decision Making  Well-known patient to the department here for routine dialysis, he is on a Monday Wednesday Friday schedule.  I spoke with Dr. Tobie with nephrology who accepts patient for dialysis.  Checked basic lab work since patient did miss a session, CBC stable compared to baseline, BMP shows, chronically elevated BUN, Creatinine, consistent with ESRD. No acute potassium abnormality. Chronic hypocalcemia, calcium  7.2. Anion gap noted 21, suspect secondary to uremia. after he is dialyzed he will be stable for discharge.  He denies any shortness of breath, or other symptoms.  Will plan for discharge home after his dialysis is completed.   Final diagnoses:  ESRD (end stage  renal disease) [facility])    ED Discharge Orders     None          Rosan Sherlean DEL, PA-C 01/30/24 9171    [name], DO 01/30/24 [phone]

## 2024-01-30 NOTE — ED Notes (Signed)
Lunch bag given to pt

## 2024-01-30 NOTE — ED Triage Notes (Signed)
 No complaints states he is just here for dailysis

## 2024-01-30 NOTE — ED Notes (Signed)
 Pt here for dialysis. Denies any pain. No acute distress at this time. Pt listening to music while resting.

## 2024-01-30 NOTE — Progress Notes (Addendum)
  Received patient in bed to unit.   Informed consent signed and in chart.    TX duration:3.5     Transported by  Hand-off given to patient's    Access used: L AVF Access issues: None   Total UF removed: 2500 Medication(s) given: None Post HD VS: 138/83      Hunter hacking LPN [facility]

## 2024-02-02 ENCOUNTER — Encounter (HOSPITAL_COMMUNITY): Payer: Self-pay

## 2024-02-02 ENCOUNTER — Other Ambulatory Visit: Payer: Self-pay

## 2024-02-02 ENCOUNTER — Emergency Department (HOSPITAL_COMMUNITY)
Admission: EM | Admit: 2024-02-02 | Discharge: 2024-02-04 | Discharge status: Against Medical Advice | Attending: Emergency Medicine | Admitting: Emergency Medicine

## 2024-02-02 DIAGNOSIS — Z79899 Other long term (current) drug therapy: Secondary | ICD-10-CM | POA: Diagnosis not present

## 2024-02-02 DIAGNOSIS — Z5329 Procedure and treatment not carried out because of patient's decision for other reasons: Secondary | ICD-10-CM | POA: Insufficient documentation

## 2024-02-02 DIAGNOSIS — N186 End stage renal disease: Secondary | ICD-10-CM | POA: Insufficient documentation

## 2024-02-02 DIAGNOSIS — Z992 Dependence on renal dialysis: Secondary | ICD-10-CM | POA: Diagnosis not present

## 2024-02-02 DIAGNOSIS — I120 Hypertensive chronic kidney disease with stage 5 chronic kidney disease or end stage renal disease: Secondary | ICD-10-CM | POA: Insufficient documentation

## 2024-02-02 DIAGNOSIS — D631 Anemia in chronic kidney disease: Secondary | ICD-10-CM | POA: Insufficient documentation

## 2024-02-02 MED ORDER — HEPARIN SODIUM (PORCINE) 1000 UNIT/ML IJ SOLN
INTRAMUSCULAR | Status: AC
  Filled 2024-02-02: qty 5

## 2024-02-02 MED ORDER — ANTICOAGULANT SODIUM CITRATE 4% (200MG/5ML) IV SOLN: 5.0000 mL | Status: DC | PRN

## 2024-02-02 MED ORDER — PENTAFLUOROPROP-TETRAFLUOROETH EX AERO: 1.0000 | INHALATION_SPRAY | CUTANEOUS | Status: DC | PRN

## 2024-02-02 MED ORDER — LIDOCAINE-PRILOCAINE 2.5-2.5 % EX CREA: 1.0000 | TOPICAL_CREAM | CUTANEOUS | Status: DC | PRN

## 2024-02-02 MED ORDER — LIDOCAINE HCL (PF) 1 % IJ SOLN: 5.0000 mL | INTRAMUSCULAR | Status: DC | PRN

## 2024-02-02 MED ORDER — HEPARIN SODIUM (PORCINE) 1000 UNIT/ML DIALYSIS: 1000.0000 [IU] | INTRAMUSCULAR | Status: DC | PRN

## 2024-02-02 MED ORDER — CHLORHEXIDINE GLUCONATE CLOTH 2 % EX PADS: 6.0000 | MEDICATED_PAD | Freq: Every day | CUTANEOUS | Status: DC

## 2024-02-02 MED ORDER — HEPARIN SODIUM (PORCINE) 1000 UNIT/ML DIALYSIS: 40.0000 [IU]/kg | INTRAMUSCULAR | Status: DC | PRN

## 2024-02-02 MED ORDER — NEPRO/CARBSTEADY PO LIQD: 237.0000 mL | ORAL | Status: DC | PRN

## 2024-02-02 MED ORDER — ALTEPLASE 2 MG IJ SOLR: 2.0000 mg | Freq: Once | INTRAMUSCULAR | Status: DC | PRN

## 2024-02-02 MED ORDER — HEPARIN SODIUM (PORCINE) 1000 UNIT/ML DIALYSIS: 2400.0000 [IU] | Freq: Once | INTRAMUSCULAR | Status: DC

## 2024-02-02 NOTE — ED Provider Notes (Signed)
 [place] EMERGENCY DEPARTMENT AT [facility] Provider Note   CSN: [id number] Arrival date & time: 02/02/24  9355     Patient presents with: Request Dialysis   Paul [name] is a 58 y.o. male with past medical history significant for obesity, hypertension, ESRD on dialysis, schizophrenia who is here for routine dialysis. No other complaints today. No missed sessions.   HPI     Prior to Admission medications   Medication Sig Start Date End Date Taking? Authorizing Provider  ARIPiprazole  (ABILIFY ) 5 MG tablet Take 1 tablet (5 mg total) by mouth at bedtime. 10/28/23   Plovsky, Elna, MD  carvedilol  (COREG ) 6.25 MG tablet Take 1 tablet (6.25 mg total) by mouth 2 (two) times daily with a meal. 01/13/24   Delbert Clam, MD  isosorbide  mononitrate (IMDUR ) 30 MG 24 hr tablet Take 1 tablet (30 mg total) by mouth daily. 10/25/23   [name], MD  OVER THE COUNTER MEDICATION Take 1 capsule by mouth daily. Lion's Mane for memory    [provider]  rosuvastatin  (CRESTOR ) 5 MG tablet Take 1 tablet (5 mg total) by mouth daily. 03/24/23     sucroferric oxyhydroxide (VELPHORO ) 500 MG chewable tablet Chew 3 tablets (1,500 mg total) by mouth 3 (three) times daily with meals. 07/29/23   Tawkaliyar, Roya, DO    Allergies: Penicillin g    Review of Systems  All other systems reviewed and are negative.   Updated Vital Signs BP (!) 170/94 (BP Location: Left Arm)   Pulse 72   Temp 97.6 F (36.4 C)   Resp 18   SpO2 100%   Physical Exam Vitals and nursing note reviewed.  Constitutional:      General: He is not in acute distress.    Appearance: Normal appearance.  HENT:     Head: Normocephalic and atraumatic.  Eyes:     General:        Right eye: No discharge.        Left eye: No discharge.  Cardiovascular:     Rate and Rhythm: Normal rate and regular rhythm.     Heart sounds: No murmur heard.    No friction rub. No gallop.  Pulmonary:     Effort:  Pulmonary effort is normal.     Breath sounds: Normal breath sounds.  Abdominal:     General: Bowel sounds are normal.     Palpations: Abdomen is soft.  Skin:    General: Skin is warm and dry.     Capillary Refill: Capillary refill takes less than 2 seconds.  Neurological:     Mental Status: He is alert and oriented to person, place, and time.  Psychiatric:        Mood and Affect: Mood normal.        Behavior: Behavior normal.     (all labs ordered are listed, but only abnormal results are displayed) Labs Reviewed - No data to display  EKG: None  Radiology: No results found.   Procedures   Medications Ordered in the ED - No data to display                                  Medical Decision Making  Well-known patient to the department here for routine dialysis, he is on a Monday Wednesday Friday schedule.  I spoke with Dr. Geralynn with nephrology who accepts patient for dialysis.  Considered routine  lab work, however patient has well-appearing, stable vital signs other than some hypertension, blood pressure 170/94, and has not missed any sessions, I think reasonable to forego lab work at this time.  Final diagnoses:  None    ED Discharge Orders     None          Rosan Sherlean VEAR DEVONNA 02/02/24 1336    Lenor Hollering, MD 02/05/24 1727

## 2024-02-02 NOTE — ED Triage Notes (Signed)
 Pt came in via POV requesting dialysis, no acute pain or distress.

## 2024-02-02 NOTE — Progress Notes (Signed)
  Received patient in bed to unit.   Informed consent signed and in chart.    TX duration:3.5     Transported by  Hand-off given to patient's nurse.    Access used: rt avf Access issues: none   Total UF removed: 1900 Medication(s) given: none Post HD VS: 159/68       Hunter Hacking LPN [facility]

## 2024-02-02 NOTE — Procedures (Signed)
 Asked to see this patient for hospital dialysis. Pt was discharged from his outpatient unit some time ago for behavior issues. The plan will be for ED HD. Pt is not to be admitted at this time. Pt will go to the dialysis unit when they are ready for the patient. When dialysis is completed pt will be sent back to ED for reassessment.   Vitals:   02/02/24 1430 02/02/24 1500 02/02/24 1530 02/02/24 1600  BP: 137/62 110/64 (!) 149/86 (!) 144/86  Pulse: 69 (!) 59 61 63  Resp: 15 14 14 13   Temp:      TempSrc:      SpO2: 98% 100% 100% 100%  Weight:           I was present at the procedure, reviewed the HD regimen and made appropriate changes.   Myer Fret MD  [facility] 02/02/2024, 4:03 PM    Recent Labs  Lab 01/30/24 0738  HGB 9.5*  CALCIUM  7.2*  CREATININE 15.95*  K 5.0   No results for input(s): IRON , TIBC, FERRITIN in the last 168 hours. Inpatient medications:  Chlorhexidine  Gluconate Cloth  6 each Topical Q0600   heparin   2,400 Units Dialysis Once in dialysis    anticoagulant sodium citrate      alteplase , anticoagulant sodium citrate , feeding supplement (NEPRO CARB STEADY), heparin , heparin , lidocaine  (PF), lidocaine -prilocaine , pentafluoroprop-tetrafluoroeth

## 2024-02-04 ENCOUNTER — Emergency Department (HOSPITAL_COMMUNITY)
Admission: EM | Admit: 2024-02-04 | Discharge: 2024-02-04 | Disposition: A | Discharge status: Home / Self Care | Attending: Emergency Medicine | Admitting: Emergency Medicine

## 2024-02-04 ENCOUNTER — Encounter (HOSPITAL_COMMUNITY): Payer: Self-pay | Admitting: Emergency Medicine

## 2024-02-04 ENCOUNTER — Other Ambulatory Visit: Payer: Self-pay

## 2024-02-04 DIAGNOSIS — I120 Hypertensive chronic kidney disease with stage 5 chronic kidney disease or end stage renal disease: Secondary | ICD-10-CM | POA: Diagnosis not present

## 2024-02-04 DIAGNOSIS — N186 End stage renal disease: Secondary | ICD-10-CM | POA: Insufficient documentation

## 2024-02-04 DIAGNOSIS — Z992 Dependence on renal dialysis: Secondary | ICD-10-CM | POA: Insufficient documentation

## 2024-02-04 DIAGNOSIS — Z5329 Procedure and treatment not carried out because of patient's decision for other reasons: Secondary | ICD-10-CM | POA: Insufficient documentation

## 2024-02-04 LAB — CBC
HCT: 25.8 % — ABNORMAL LOW (ref 39.0–52.0)
Hemoglobin: 8.8 g/dL — ABNORMAL LOW (ref 13.0–17.0)
MCH: 30.2 pg (ref 26.0–34.0)
MCHC: 34.1 g/dL (ref 30.0–36.0)
MCV: 88.7 fL (ref 80.0–100.0)
Platelets: 179 K/uL (ref 150–400)
RBC: 2.91 MIL/uL — ABNORMAL LOW (ref 4.22–5.81)
RDW: 17.2 % — ABNORMAL HIGH (ref 11.5–15.5)
WBC: 6.2 K/uL (ref 4.0–10.5)
nRBC: 0 % (ref 0.0–0.2)

## 2024-02-04 LAB — RENAL FUNCTION PANEL
Albumin: 3.2 g/dL — ABNORMAL LOW (ref 3.5–5.0)
Anion gap: 18 — ABNORMAL HIGH (ref 5–15)
BUN: 85 mg/dL — ABNORMAL HIGH (ref 6–20)
CO2: 20 mmol/L — ABNORMAL LOW (ref 22–32)
Calcium: 6.7 mg/dL — ABNORMAL LOW (ref 8.9–10.3)
Chloride: 98 mmol/L (ref 98–111)
Creatinine, Ser: 12.22 mg/dL — ABNORMAL HIGH (ref 0.61–1.24)
GFR, Estimated: 4 mL/min — ABNORMAL LOW (ref >60)
Glucose, Bld: 101 mg/dL — ABNORMAL HIGH (ref 70–99)
Phosphorus: 11.5 mg/dL — ABNORMAL HIGH (ref 2.5–4.6)
Potassium: 4.3 mmol/L (ref 3.5–5.1)
Sodium: 136 mmol/L (ref 135–145)

## 2024-02-04 MED ORDER — ALTEPLASE 2 MG IJ SOLR: 2.0000 mg | Freq: Once | INTRAMUSCULAR | Status: DC | PRN

## 2024-02-04 MED ORDER — ANTICOAGULANT SODIUM CITRATE 4% (200MG/5ML) IV SOLN
5.0000 mL | Status: DC | PRN
  Filled 2024-02-04: qty 5

## 2024-02-04 MED ORDER — CHLORHEXIDINE GLUCONATE CLOTH 2 % EX PADS
6.0000 | MEDICATED_PAD | Freq: Every day | CUTANEOUS | Status: DC
Start: 2024-02-04 — End: 2024-02-04

## 2024-02-04 MED ORDER — NEPRO/CARBSTEADY PO LIQD
237.0000 mL | ORAL | Status: DC | PRN
  Filled 2024-02-04: qty 237

## 2024-02-04 MED ORDER — HEPARIN SODIUM (PORCINE) 1000 UNIT/ML DIALYSIS: 1000.0000 [IU] | INTRAMUSCULAR | Status: DC | PRN

## 2024-02-04 MED ORDER — LIDOCAINE HCL (PF) 1 % IJ SOLN: 5.0000 mL | INTRAMUSCULAR | Status: DC | PRN

## 2024-02-04 MED ORDER — LIDOCAINE-PRILOCAINE 2.5-2.5 % EX CREA: 1.0000 | TOPICAL_CREAM | CUTANEOUS | Status: DC | PRN

## 2024-02-04 MED ORDER — PENTAFLUOROPROP-TETRAFLUOROETH EX AERO: 1.0000 | INHALATION_SPRAY | CUTANEOUS | Status: DC | PRN

## 2024-02-04 MED ORDER — DARBEPOETIN ALFA 150 MCG/0.3ML IJ SOSY
150.0000 ug | PREFILLED_SYRINGE | Freq: Once | INTRAMUSCULAR | Status: AC
  Administered 2024-02-04: 150 ug via SUBCUTANEOUS
  Filled 2024-02-04: qty 0.3

## 2024-02-04 MED ORDER — HEPARIN SODIUM (PORCINE) 1000 UNIT/ML DIALYSIS
2400.0000 [IU] | Freq: Once | INTRAMUSCULAR | Status: AC
  Administered 2024-02-04: 2400 [IU] via INTRAVENOUS_CENTRAL

## 2024-02-04 NOTE — Progress Notes (Signed)
 Received patient in bed to unit.  Alert and oriented.  Informed consent signed and in chart.   TX duration:3.5  Patient tolerated well.  Transported back to the room  Alert, without acute distress.  Hand-off given to patient's nurse.   Access used: AVF Access issues: NA  Total UF removed: 2.5L Medication(s) given: [place] Post HD weight: 83KG   02/04/24 1710  Vitals  Temp (!) 97.5 F (36.4 C)  Temp Source Oral  BP (!) 140/88  MAP (mmHg) 98  Pulse Rate 72  ECG Heart Rate 73  Resp 18  Oxygen Therapy  SpO2 98 %  O2 Device Room Air  During Treatment Monitoring  Blood Flow Rate (mL/min) 0 mL/min  Arterial Pressure (mmHg) -8.48 mmHg  Venous Pressure (mmHg) 132.92 mmHg  TMP (mmHg) 17.17 mmHg  Ultrafiltration Rate (mL/min) 1013 mL/min  Dialysate Flow Rate (mL/min) 299 ml/min  Dialysate Potassium Concentration 2  Dialysate Calcium  Concentration 2.5  Duration of HD Treatment -hour(s) 3 hour(s)  Cumulative Fluid Removed (mL) per Treatment  2500.23  HD Safety Checks Performed Yes  Intra-Hemodialysis Comments Tx completed  Post Treatment  Dialyzer Clearance Lightly streaked  Hemodialysis Intake (mL) 120 mL  Liters Processed 72  Fluid Removed (mL) 2500 mL  Tolerated HD Treatment Yes  AVG/AVF Arterial Site Held (minutes) 5 minutes  AVG/AVF Venous Site Held (minutes) 5 minutes  Fistula / Graft Right Upper arm Arteriovenous fistula  No placement date or time found.   Placed prior to admission: Yes  Orientation: Right  Access Location: Upper arm  Access Type: Arteriovenous fistula  Site Condition No complications  Fistula / Graft Assessment Present;Thrill  Status Accessed  Needle Size 15  Drainage Description None    Ollen LITTIE [facility]

## 2024-02-04 NOTE — ED Triage Notes (Signed)
 Patient arrives from home POV for dialysis today. Last received treatment on Monday of this week. Is due again today. No complaints otherwise.

## 2024-02-04 NOTE — ED Notes (Signed)
 Pt to dialysis.

## 2024-02-04 NOTE — ED Provider Notes (Signed)
 [place] EMERGENCY DEPARTMENT AT [facility] Provider Note   CSN: [id number] Arrival date & time: 02/04/24  9363     Patient presents with: Needs Dialysis    Paul [name] is a 58 y.o. male ESRD MWF presents to the ER today for dialysis. Patient is well known to the ER. Last session was on Monday. No complaints. Denies fever, chest pain, or SOB. Feels at his baseline of health.  HPI     Prior to Admission medications   Medication Sig Start Date End Date Taking? Authorizing Provider  ARIPiprazole  (ABILIFY ) 5 MG tablet Take 1 tablet (5 mg total) by mouth at bedtime. 10/28/23   Plovsky, Elna, MD  carvedilol  (COREG ) 6.25 MG tablet Take 1 tablet (6.25 mg total) by mouth 2 (two) times daily with a meal. 01/13/24   Delbert Clam, MD  isosorbide  mononitrate (IMDUR ) 30 MG 24 hr tablet Take 1 tablet (30 mg total) by mouth daily. 10/25/23   [name], MD  OVER THE COUNTER MEDICATION Take 1 capsule by mouth daily. Lion's Mane for memory    [provider]  rosuvastatin  (CRESTOR ) 5 MG tablet Take 1 tablet (5 mg total) by mouth daily. 03/24/23     sucroferric oxyhydroxide (VELPHORO ) 500 MG chewable tablet Chew 3 tablets (1,500 mg total) by mouth 3 (three) times daily with meals. 07/29/23   Tawkaliyar, Roya, DO    Allergies: Penicillin g    Review of Systems  Constitutional:  Negative for chills and fever.  Respiratory:  Negative for cough and shortness of breath.   Cardiovascular:  Negative for chest pain.    Updated Vital Signs BP (!) 184/89 (BP Location: Left Arm)   Pulse 75   Temp (!) 97.5 F (36.4 C)   Resp 20   Ht 5' 3 (1.6 m)   Wt 83.9 kg   SpO2 99%   BMI 32.77 kg/m   Physical Exam Vitals and nursing note reviewed.  Constitutional:      General: He is not in acute distress.    Appearance: He is not ill-appearing or toxic-appearing.  Eyes:     Comments: Chronic cloudy conjunctiva on the right   Pulmonary:     Effort: Pulmonary effort is  normal. No respiratory distress.  Skin:    General: Skin is warm and dry.  Neurological:     Mental Status: He is alert.  Psychiatric:        Mood and Affect: Mood normal.     (all labs ordered are listed, but only abnormal results are displayed) Labs Reviewed - No data to display  EKG: None  Radiology: No results found.  Procedures   Medications Ordered in the ED - No data to display  Clinical Course as of 02/04/24 1147  Wed Feb 04, 2024  0808 Spoke with nephrology, Dr. Geralynn who will place orders [RR]    Clinical Course User Index [RR] Bernis Ernst, PA-C                               Medical Decision Making  58 y.o. male presents to the ER today for evaluation of need dialysis. Differential diagnosis includes but is not limited to volume overload, electrolyte abnormality. Vital signs mildly elevated blood pressure at 133/92, otherwise unremarkable. Physical exam as noted above.   Patient is well-known to the emergency department for needing dialysis and regularly gets dialysis through the emergency department.  Last  visit was on 02-02-2024.  Last treatment duration was 3.5.  Documented no access issues.  He denies any symptoms or complaints.  I consulted nephrology and spoke with Dr. Geralynn alerting him to the patient's arrival to the emergency department.  He will put in orders for dialysis.  Anticipating patient being discharged after dialysis baring no complications.  Portions of this report may have been transcribed using voice recognition software. Every effort was made to ensure accuracy; however, inadvertent computerized transcription errors may be present.    Final diagnoses:  None    ED Discharge Orders     None          Bernis Ernst, [place] 02/04/24 1151    [name], [place] 02/04/24 1234

## 2024-02-04 NOTE — Procedures (Signed)
 Asked to see this patient for hospital dialysis. Pt was discharged from his outpatient unit for behavior issues. The plan will be for ED HD. Pt is not to be admitted at this time. Pt will go to the dialysis unit when they are ready for the patient. When dialysis is completed pt will be sent back to ED for reassessment.   Vitals:   02/04/24 0643 02/04/24 0647 02/04/24 0647  BP:  (!) 184/89 (!) 184/89  Pulse:  76 75  Resp:  16 20  Temp:  (!) 97.5 F (36.4 C) (!) 97.5 F (36.4 C)  TempSrc:  Oral   SpO2:  98% 99%  Weight: 83.9 kg    Height: 5' 3 (1.6 m)      Last OP HD unit info from 07/2023 -->  4h  B400 76.5kg  2K bath RUA AVF Heparin  2400    Clinical issues other than volume: 1) Anemia of esrd:  - pt dose not have a nephrologist or HD unit so does not have access to ESA or IV iron , other than when here for hospital HD sessions - last tsat 24% and ferritin 993 on 12/12/23, consider IV Fe - recent Hb around 9.5- 11 - last 3 doses of darbepoeitin here -->   - 150 mcg 8/18 - 150 mcg 8/25 - 100 mcg 01/23/24   Plan: ->  will ^back to 150 mcg and try to get a dose given today in HD      I was present at the procedure, reviewed the HD regimen and made appropriate changes.   Myer Fret MD  [facility] 02/04/2024, 8:26 AM    Recent Labs  Lab 01/30/24 0738  HGB 9.5*  CALCIUM  7.2*  CREATININE 15.95*  K 5.0   No results for input(s): IRON , TIBC, FERRITIN in the last 168 hours. Inpatient medications:

## 2024-02-05 ENCOUNTER — Ambulatory Visit (HOSPITAL_COMMUNITY)
Admission: RE | Admit: 2024-02-05 | Discharge: 2024-02-05 | Disposition: A | Discharge status: Home / Self Care | Source: Ambulatory Visit | Attending: Vascular Surgery | Admitting: Vascular Surgery

## 2024-02-05 ENCOUNTER — Encounter (HOSPITAL_COMMUNITY)
Admission: RE | Disposition: A | Discharge status: Home / Self Care | Payer: Self-pay | Source: Ambulatory Visit | Attending: Vascular Surgery

## 2024-02-05 ENCOUNTER — Other Ambulatory Visit: Payer: Self-pay

## 2024-02-05 DIAGNOSIS — Z992 Dependence on renal dialysis: Secondary | ICD-10-CM | POA: Insufficient documentation

## 2024-02-05 DIAGNOSIS — Y832 Surgical operation with anastomosis, bypass or graft as the cause of abnormal reaction of the patient, or of later complication, without mention of misadventure at the time of the procedure: Secondary | ICD-10-CM | POA: Insufficient documentation

## 2024-02-05 DIAGNOSIS — N186 End stage renal disease: Secondary | ICD-10-CM | POA: Insufficient documentation

## 2024-02-05 DIAGNOSIS — T82858A Stenosis of vascular prosthetic devices, implants and grafts, initial encounter: Secondary | ICD-10-CM | POA: Diagnosis not present

## 2024-02-05 HISTORY — PX: A/V FISTULAGRAM: CATH118298

## 2024-02-05 HISTORY — PX: VENOUS ANGIOPLASTY: CATH118376

## 2024-02-05 SURGERY — A/V FISTULAGRAM
Anesthesia: LOCAL | Laterality: Right

## 2024-02-05 MED ORDER — IODIXANOL 320 MG/ML IV SOLN
INTRAVENOUS | Status: DC | PRN
  Administered 2024-02-05: 20 mL via INTRAVENOUS

## 2024-02-05 MED ORDER — LIDOCAINE HCL (PF) 1 % IJ SOLN
INTRAMUSCULAR | Status: AC
  Filled 2024-02-05: qty 30

## 2024-02-05 MED ORDER — LIDOCAINE HCL (PF) 1 % IJ SOLN
INTRAMUSCULAR | Status: DC | PRN
  Administered 2024-02-05: 2 mL via SUBCUTANEOUS

## 2024-02-05 MED ORDER — HEPARIN (PORCINE) IN NACL 1000-0.9 UT/500ML-% IV SOLN
INTRAVENOUS | Status: DC | PRN
  Administered 2024-02-05: 500 mL

## 2024-02-05 SURGICAL SUPPLY — 9 items
BALLOON MUSTANG 7X100X75 (BALLOONS) IMPLANT
KIT ENCORE 26 ADVANTAGE (KITS) IMPLANT
KIT MICROPUNCTURE NIT STIFF (SHEATH) IMPLANT
SHEATH PINNACLE R/O II 7F 4CM (SHEATH) IMPLANT
SHEATH PROBE COVER 6X72 (BAG) IMPLANT
STOPCOCK MORSE 400PSI 3WAY (MISCELLANEOUS) IMPLANT
TRAY PV CATH (CUSTOM PROCEDURE TRAY) ×1 IMPLANT
TUBING CIL FLEX 10 FLL-RA (TUBING) IMPLANT
WIRE BENTSON .035X145CM (WIRE) IMPLANT

## 2024-02-05 NOTE — Op Note (Signed)
    Patient name: Paul [name] MRN: [id number] DOB: 01-01-1966 Sex: male  01/16/2024 - 02/05/2024 Pre-operative Diagnosis: ESRD Post-operative diagnosis:  Same Surgeon:  Malvina New Procedure Performed:  1.  Ultrasound-guided access, right brachiocephalic fistula  2.  Fistulogram  3.  Venoplasty, right cephalic vein (peripheral)   Indications: This is a 58 year old gentleman with end-stage renal disease having trouble with access.  He comes in today for fistulogram  Procedure:  The patient was identified in the holding area and taken to room 8.  The patient was then placed supine on the table and prepped and draped in the usual sterile fashion.  A time out was called. Ultrasound was used to evaluate the fistula.  The vein was patent and compressible.  A digital ultrasound image was acquired.  The fistula was then accessed under ultrasound guidance using a micropuncture needle.  An 018 wire was then asvanced without resistance and a micropuncture sheath was placed.  Contrast injections were then performed through the sheath.  Findings: No evidence of central venous stenosis.  There is a stent at the cephalic vein arch.  There is slight narrowing versus size mismatch at the proximal edge.  The proximal portion of the fistula is aneurysmal near the anastomosis and has significant luminal irregularity with diffused stenosis at least 50% or greater throughout the majority of the vein in the upper arm   Intervention: After the above images were acquired the decision was made to proceed with intervention.  Over a Bentson wire a 7 French sheath was placed.  I selected a 7 mm balloon and performed balloon venoplasty from the leading edge of the stent in the cephalic vein arch all the way back to the aneurysmal portion of the fistula.  With the each balloon inflation, the patient screamed out in pain.  I was able to have full effacement of the balloon.  Completion imaging showed resolution of the  stenosis to less than 20%.  I was concerned about upsizing the vein given the patient's discomfort and fear of rupture.  The majority of the vein does not appear healthy.  If he continues to have frequent issues, we could consider lining this with stents versus a new access.  Impression:  #1  Successful balloon venoplasty of diffuse cephalic vein stenosis  #2  Fistula remains amenable to percutaneous intervention   V. Malvina New, M.D., [facility] [place] Office: [phone] Pager:  [phone]

## 2024-02-05 NOTE — Progress Notes (Signed)
 Discharged instructions reviewed with patient; verbally repeats them back to me with understanding acknowledged. PT discharge home in stable condition.

## 2024-02-05 NOTE — H&P (Signed)
   Patient name: Paul [name] MRN: [id number] DOB: June 19, 1965 Sex: male  REASON FOR VISIT:     ESRD  HISTORY OF PRESENT ILLNESS:   Paul [name] is a 58 y.o. male who is status post right brachiocephalic fistula on 07/08/2022.  He underwent a fistulogram in June 2025 and had stenting of the right cephalic vein arch with an 8 mm Viabahn as well as angioplasty of the cephalic vein with a 7 mm balloon.  He is having trouble with flow rates and is here for fistulogram  CURRENT MEDICATIONS:    No current facility-administered medications for this encounter.    REVIEW OF SYSTEMS:   [X]  denotes positive finding, [ ]  denotes negative finding Cardiac  Comments:  Chest pain or chest pressure:    Shortness of breath upon exertion:    Short of breath when lying flat:    Irregular heart rhythm:    Constitutional    Fever or chills:      PHYSICAL EXAM:   Vitals:   02/05/24 0753  BP: 126/86  Pulse: 72  Resp: 16  SpO2: 95%    GENERAL: The patient is a well-nourished male, in no acute distress. The vital signs are documented above. CARDIOVASCULAR: There is a regular rate and rhythm. PULMONARY: Non-labored respirations Palpable thrill within fistula  STUDIES:      MEDICAL ISSUES:   ESRD: I discussed proceeding with a fistulogram and intervention as indicated.  All questions were answered.  Malvina Serene CLORE, MD, FACS [facility] [phone] Pager [phone]

## 2024-02-06 ENCOUNTER — Encounter (HOSPITAL_COMMUNITY): Payer: Self-pay

## 2024-02-06 ENCOUNTER — Emergency Department (HOSPITAL_COMMUNITY): Admission: EM | Admit: 2024-02-06 | Discharge: 2024-02-07 | Disposition: A | Discharge status: Home / Self Care

## 2024-02-06 DIAGNOSIS — Z992 Dependence on renal dialysis: Secondary | ICD-10-CM | POA: Insufficient documentation

## 2024-02-06 DIAGNOSIS — N186 End stage renal disease: Secondary | ICD-10-CM | POA: Insufficient documentation

## 2024-02-06 DIAGNOSIS — Z5329 Procedure and treatment not carried out because of patient's decision for other reasons: Secondary | ICD-10-CM | POA: Insufficient documentation

## 2024-02-06 DIAGNOSIS — I120 Hypertensive chronic kidney disease with stage 5 chronic kidney disease or end stage renal disease: Secondary | ICD-10-CM | POA: Diagnosis not present

## 2024-02-06 MED ORDER — ANTICOAGULANT SODIUM CITRATE 4% (200MG/5ML) IV SOLN: 5.0000 mL | Status: DC | PRN

## 2024-02-06 MED ORDER — LIDOCAINE-PRILOCAINE 2.5-2.5 % EX CREA: 1.0000 | TOPICAL_CREAM | CUTANEOUS | Status: DC | PRN

## 2024-02-06 MED ORDER — CHLORHEXIDINE GLUCONATE CLOTH 2 % EX PADS: 6.0000 | MEDICATED_PAD | Freq: Every day | CUTANEOUS | Status: DC

## 2024-02-06 MED ORDER — HEPARIN SODIUM (PORCINE) 1000 UNIT/ML DIALYSIS
2400.0000 [IU] | Freq: Once | INTRAMUSCULAR | Status: AC
Start: 2024-02-06 — End: 2024-02-06
  Administered 2024-02-06: 2400 [IU] via INTRAVENOUS_CENTRAL
  Filled 2024-02-06: qty 3

## 2024-02-06 MED ORDER — NEPRO/CARBSTEADY PO LIQD: 237.0000 mL | ORAL | Status: DC | PRN

## 2024-02-06 MED ORDER — HEPARIN SODIUM (PORCINE) 1000 UNIT/ML DIALYSIS: 1000.0000 [IU] | INTRAMUSCULAR | Status: DC | PRN

## 2024-02-06 MED ORDER — ALTEPLASE 2 MG IJ SOLR: 2.0000 mg | Freq: Once | INTRAMUSCULAR | Status: DC | PRN

## 2024-02-06 MED ORDER — HEPARIN SODIUM (PORCINE) 1000 UNIT/ML IJ SOLN
INTRAMUSCULAR | Status: AC
  Filled 2024-02-06: qty 3

## 2024-02-06 MED ORDER — PENTAFLUOROPROP-TETRAFLUOROETH EX AERO: 1.0000 | INHALATION_SPRAY | CUTANEOUS | Status: DC | PRN

## 2024-02-06 MED ORDER — LIDOCAINE HCL (PF) 1 % IJ SOLN: 5.0000 mL | INTRAMUSCULAR | Status: DC | PRN

## 2024-02-06 NOTE — Procedures (Signed)
 Asked to see this patient for hospital dialysis. Pt was discharged from his outpatient unit for behavior issues. The plan will be for ED HD. Pt is not to be admitted at this time. Pt will go to the dialysis unit when they are ready for the patient. When dialysis is completed pt will be sent back to ED for reassessment.   Vitals:   02/06/24 0717 02/06/24 1225 02/06/24 1330 02/06/24 1350  BP: (!) 171/89 126/80 139/86 123/89  Pulse: 83 66 68 72  Resp: 18 14 20 15   Temp: (!) 97.5 F (36.4 C) (!) 97.5 F (36.4 C) (!) 97.4 F (36.3 C)   TempSrc: Oral Oral    SpO2: 96% 100% 100% 100%  Weight:   85.5 kg    Last OP HD unit info from 07/2023 -->  4h  B400 76.5kg  2K bath RUA AVF Heparin  2400    Clinical issues other than volume: 1) Anemia of esrd:  - pt dose not have a nephrologist or HD unit so does not have access to ESA or IV iron , other than when here for hospital HD sessions - last tsat 24% and ferritin 993 on 12/12/23, consider IV Fe - recent Hb around 9.5- 11 - last 3 doses of darbepoeitin here -->   - 150 mcg 8/25 - 100 mcg 9/19 - 150 mcg 10/01   Plan: ->  next dose late next week      I was present at the procedure, reviewed the HD regimen and made appropriate changes.   Myer Fret MD  [facility] 02/06/2024, 1:58 PM    Recent Labs  Lab 02/04/24 1350  HGB 8.8*  ALBUMIN 3.2*  CALCIUM  6.7*  PHOS 11.5*  CREATININE 12.22*  K 4.3   No results for input(s): IRON , TIBC, FERRITIN in the last 168 hours. Inpatient medications:  Chlorhexidine  Gluconate Cloth  6 each Topical Q0600    anticoagulant sodium citrate      alteplase , anticoagulant sodium citrate , feeding supplement (NEPRO CARB STEADY), heparin , lidocaine  (PF), lidocaine -prilocaine , pentafluoroprop-tetrafluoroeth

## 2024-02-06 NOTE — ED Triage Notes (Signed)
 Pt arrives POV from home for dialysis. Pt last received Wednesday. Pt complaining of mild SHOB, no other complaints att.

## 2024-02-06 NOTE — ED Provider Notes (Signed)
 [place] EMERGENCY DEPARTMENT AT [facility] Provider Note   CSN: [id number] Arrival date & time: 02/06/24  9296     Patient presents with: needs dialysis   DARIS HARKINS is a 58 y.o. male.   58 year old male with past medical history of end-stage renal disease presenting to the emergency department today with no complaints requesting dialysis.  The patient states that he is in his normal state of health and denies any acute symptoms.        Prior to Admission medications   Medication Sig Start Date End Date Taking? Authorizing Provider  ARIPiprazole  (ABILIFY ) 5 MG tablet Take 1 tablet (5 mg total) by mouth at bedtime. 10/28/23   Plovsky, Elna, MD  carvedilol  (COREG ) 6.25 MG tablet Take 1 tablet (6.25 mg total) by mouth 2 (two) times daily with a meal. 01/13/24   Delbert Clam, MD  isosorbide  mononitrate (IMDUR ) 30 MG 24 hr tablet Take 1 tablet (30 mg total) by mouth daily. 10/25/23   [name], MD  OVER THE COUNTER MEDICATION Take 1 capsule by mouth daily. Lion's Mane for memory    [provider]  rosuvastatin  (CRESTOR ) 5 MG tablet Take 1 tablet (5 mg total) by mouth daily. 03/24/23     sucroferric oxyhydroxide (VELPHORO ) 500 MG chewable tablet Chew 3 tablets (1,500 mg total) by mouth 3 (three) times daily with meals. 07/29/23   Tawkaliyar, Roya, DO    Allergies: Penicillin g    Review of Systems  All other systems reviewed and are negative.   Updated Vital Signs BP 118/79   Pulse 75   Temp 97.8 F (36.6 C)   Resp 17   Wt 83 kg   SpO2 97%   BMI 32.41 kg/m   Physical Exam Vitals and nursing note reviewed.   Gen: NAD Eyes: PERRL, EOMI HEENT: no oropharyngeal swelling Neck: trachea midline Resp: clear to auscultation bilaterally Card: RRR, no murmurs, rubs, or gallops Abd: nontender, nondistended Extremities: no calf tenderness, no edema Vascular: 2+ radial pulses bilaterally, 2+ DP pulses bilaterally Skin: no rashes Psyc:  acting appropriately   (all labs ordered are listed, but only abnormal results are displayed) Labs Reviewed - No data to display  EKG: None  Radiology: DG Chest 2 View Result Date: 02/09/2024 CLINICAL DATA:  Shortness of breath. EXAM: CHEST - 2 VIEW COMPARISON:  Portable chest 12/08/2023 FINDINGS: The heart is borderline prominent. No vascular congestion is seen. Mild aortic tortuosity with stable mediastinum. The lungs are clear. There is chronic wedging and partial flattening of the lower thoracic and upper lumbar vertebra. There is advanced thoracic spondylosis. A vascular stent is again noted in the right axilla. IMPRESSION: 1. No evidence of acute chest disease. 2. Borderline cardiomegaly. 3. Chronic wedging and partial flattening of the lower thoracic and upper lumbar vertebra. Electronically Signed   By: Francis Quam M.D.   On: 02/09/2024 07:56     Procedures   Medications Ordered in the ED  heparin  injection 2,400 Units (2,400 Units Dialysis Given 02/06/24 1320)                                    Medical Decision Making 58 year old male with past medical history of end-stage renal disease presenting to the emergency department today requesting dialysis.  The patient is in normal state of health and is hemodynamically stable.  Will call and discussed this case with nephrology.  Final diagnoses:  End stage renal disease [facility])    ED Discharge Orders     None          Ula Prentice SAUNDERS, MD 02/09/24 1606

## 2024-02-09 ENCOUNTER — Emergency Department (HOSPITAL_COMMUNITY)
Admission: EM | Admit: 2024-02-09 | Discharge: 2024-02-09 | Discharge status: Against Medical Advice | Attending: Emergency Medicine | Admitting: Emergency Medicine

## 2024-02-09 ENCOUNTER — Other Ambulatory Visit: Payer: Self-pay

## 2024-02-09 ENCOUNTER — Encounter (HOSPITAL_COMMUNITY): Payer: Self-pay | Admitting: *Deleted

## 2024-02-09 ENCOUNTER — Emergency Department (HOSPITAL_COMMUNITY)

## 2024-02-09 DIAGNOSIS — R0602 Shortness of breath: Secondary | ICD-10-CM | POA: Insufficient documentation

## 2024-02-09 DIAGNOSIS — Z5329 Procedure and treatment not carried out because of patient's decision for other reasons: Secondary | ICD-10-CM | POA: Diagnosis not present

## 2024-02-09 DIAGNOSIS — N186 End stage renal disease: Secondary | ICD-10-CM | POA: Diagnosis not present

## 2024-02-09 DIAGNOSIS — Z992 Dependence on renal dialysis: Secondary | ICD-10-CM | POA: Insufficient documentation

## 2024-02-09 DIAGNOSIS — I120 Hypertensive chronic kidney disease with stage 5 chronic kidney disease or end stage renal disease: Secondary | ICD-10-CM | POA: Diagnosis not present

## 2024-02-09 DIAGNOSIS — I517 Cardiomegaly: Secondary | ICD-10-CM | POA: Diagnosis not present

## 2024-02-09 DIAGNOSIS — I771 Stricture of artery: Secondary | ICD-10-CM | POA: Diagnosis not present

## 2024-02-09 LAB — CBC
HCT: 27.6 % — ABNORMAL LOW (ref 39.0–52.0)
Hemoglobin: 9.2 g/dL — ABNORMAL LOW (ref 13.0–17.0)
MCH: 30 pg (ref 26.0–34.0)
MCHC: 33.3 g/dL (ref 30.0–36.0)
MCV: 89.9 fL (ref 80.0–100.0)
Platelets: 214 K/uL (ref 150–400)
RBC: 3.07 MIL/uL — ABNORMAL LOW (ref 4.22–5.81)
RDW: 17.7 % — ABNORMAL HIGH (ref 11.5–15.5)
WBC: 5.9 K/uL (ref 4.0–10.5)
nRBC: 0 % (ref 0.0–0.2)

## 2024-02-09 LAB — BASIC METABOLIC PANEL WITH GFR
Anion gap: 17 — ABNORMAL HIGH (ref 5–15)
BUN: 76 mg/dL — ABNORMAL HIGH (ref 6–20)
CO2: 19 mmol/L — ABNORMAL LOW (ref 22–32)
Calcium: 7 mg/dL — ABNORMAL LOW (ref 8.9–10.3)
Chloride: 102 mmol/L (ref 98–111)
Creatinine, Ser: 13.51 mg/dL — ABNORMAL HIGH (ref 0.61–1.24)
GFR, Estimated: 4 mL/min — ABNORMAL LOW (ref >60)
Glucose, Bld: 75 mg/dL (ref 70–99)
Potassium: 4.5 mmol/L (ref 3.5–5.1)
Sodium: 138 mmol/L (ref 135–145)

## 2024-02-09 MED ORDER — HEPARIN SODIUM (PORCINE) 1000 UNIT/ML DIALYSIS
20.0000 [IU]/kg | INTRAMUSCULAR | Status: DC | PRN
  Administered 2024-02-09: 1700 [IU] via INTRAVENOUS_CENTRAL

## 2024-02-09 MED ORDER — CHLORHEXIDINE GLUCONATE CLOTH 2 % EX PADS: 6.0000 | MEDICATED_PAD | Freq: Every day | CUTANEOUS | Status: DC

## 2024-02-09 MED ORDER — HEPARIN SODIUM (PORCINE) 1000 UNIT/ML IJ SOLN
INTRAMUSCULAR | Status: AC
  Filled 2024-02-09: qty 2

## 2024-02-09 NOTE — ED Notes (Signed)
 Pt transported to xray

## 2024-02-09 NOTE — ED Notes (Addendum)
 Pt ambulated to the bed, from the lobby. Pt sounded winded walking, MD at bedside. Attempted to collect labs, pt refused. Pt states that sometimes I just get tired of it.

## 2024-02-09 NOTE — ED Notes (Signed)
 Pt refused blood draw....KM

## 2024-02-09 NOTE — ED Triage Notes (Signed)
 Pt is here POV from home for routine HD.  Pt had his last HD treatment on Friday.  No distress, no pain, no sob, no CP

## 2024-02-09 NOTE — Progress Notes (Signed)
   02/09/24 1630  Vitals  Pulse Rate 75  Resp 16  BP (!) 172/94  SpO2 98 %  Post Treatment  Dialyzer Clearance Lightly streaked  Hemodialysis Intake (mL) 0 mL  Liters Processed 59.4  Fluid Removed (mL) 2100 mL  Tolerated HD Treatment Yes  Post-Hemodialysis Comments TERMINATED TX 19 MINS EARLY PER PT REQUEST  AVG/AVF Arterial Site Held (minutes) 10 minutes  AVG/AVF Venous Site Held (minutes) 10 minutes   Received patient in bed to unit.  Alert and oriented.  Informed consent signed and in chart.   TX duration:2HRS 41 MINS PER PT REQUEST ADVISED OF RISKS  Patient tolerated well.  Transported back to the room  Alert, without acute distress.  Hand-off given to patient's nurse.   Access used: RAVF Access issues: NONE  Total UF removed: 2.1L Medication(s) given: NONE   [name]

## 2024-02-09 NOTE — ED Provider Notes (Signed)
 [place] EMERGENCY DEPARTMENT AT [facility] Provider Note   CSN: [id number] Arrival date & time: 02/09/24  9354     Patient presents with: Needs Dialysis   Paul [name] is a 58 y.o. male.   58 yo M with a chief complaints of needing dialysis.  Last got dialysis on Friday.  He denies any significant edema denies feeling bad denies nausea vomiting or diarrhea.  He was noted to have some trouble breathing on the way from triage to his bed.  Denied any obvious known trouble breathing at home.  Thought it could be due to fluid.  Denies coughing denies fevers.        Prior to Admission medications   Medication Sig Start Date End Date Taking? Authorizing Provider  ARIPiprazole  (ABILIFY ) 5 MG tablet Take 1 tablet (5 mg total) by mouth at bedtime. 10/28/23   Plovsky, Elna, MD  carvedilol  (COREG ) 6.25 MG tablet Take 1 tablet (6.25 mg total) by mouth 2 (two) times daily with a meal. 01/13/24   Delbert Clam, MD  isosorbide  mononitrate (IMDUR ) 30 MG 24 hr tablet Take 1 tablet (30 mg total) by mouth daily. 10/25/23   [name], MD  OVER THE COUNTER MEDICATION Take 1 capsule by mouth daily. Lion's Mane for memory    [provider]  rosuvastatin  (CRESTOR ) 5 MG tablet Take 1 tablet (5 mg total) by mouth daily. 03/24/23     sucroferric oxyhydroxide (VELPHORO ) 500 MG chewable tablet Chew 3 tablets (1,500 mg total) by mouth 3 (three) times daily with meals. 07/29/23   Tawkaliyar, Roya, DO    Allergies: Penicillin g    Review of Systems  Updated Vital Signs BP (!) 169/82   Pulse 65   Temp 97.6 F (36.4 C)   Resp 13   Ht 5' 3 (1.6 m)   Wt 87.1 kg   SpO2 99%   BMI 34.01 kg/m   Physical Exam Vitals and nursing note reviewed.  Constitutional:      Appearance: He is well-developed.  HENT:     Head: Normocephalic and atraumatic.  Eyes:     Pupils: Pupils are equal, round, and reactive to light.  Neck:     Vascular: No JVD.  Cardiovascular:     Rate  and Rhythm: Normal rate and regular rhythm.     Heart sounds: No murmur heard.    No friction rub. No gallop.  Pulmonary:     Effort: No respiratory distress.     Breath sounds: No wheezing.  Abdominal:     General: There is no distension.     Tenderness: There is no abdominal tenderness. There is no guarding or rebound.  Musculoskeletal:        General: Normal range of motion.     Cervical back: Normal range of motion and neck supple.  Skin:    Coloration: Skin is not pale.     Findings: No rash.  Neurological:     Mental Status: He is alert and oriented to person, place, and time.  Psychiatric:        Behavior: Behavior normal.     (all labs ordered are listed, but only abnormal results are displayed) Labs Reviewed  CBC - Abnormal; Notable for the following components:      Result Value   RBC 3.07 (*)    Hemoglobin 9.2 (*)    HCT 27.6 (*)    RDW 17.7 (*)    All other components within normal limits  BASIC METABOLIC PANEL WITH GFR - Abnormal; Notable for the following components:   CO2 19 (*)    BUN 76 (*)    Creatinine, Ser 13.51 (*)    Calcium  7.0 (*)    GFR, Estimated 4 (*)    Anion gap 17 (*)    All other components within normal limits    EKG: None  Radiology: DG Chest 2 View Result Date: 02/09/2024 CLINICAL DATA:  Shortness of breath. EXAM: CHEST - 2 VIEW COMPARISON:  Portable chest 12/08/2023 FINDINGS: The heart is borderline prominent. No vascular congestion is seen. Mild aortic tortuosity with stable mediastinum. The lungs are clear. There is chronic wedging and partial flattening of the lower thoracic and upper lumbar vertebra. There is advanced thoracic spondylosis. A vascular stent is again noted in the right axilla. IMPRESSION: 1. No evidence of acute chest disease. 2. Borderline cardiomegaly. 3. Chronic wedging and partial flattening of the lower thoracic and upper lumbar vertebra. Electronically Signed   By: Francis Quam M.D.   On: 02/09/2024 07:56      Procedures   Medications Ordered in the ED  Chlorhexidine  Gluconate Cloth 2 % PADS 6 each (has no administration in time range)                                    Medical Decision Making Amount and/or Complexity of Data Reviewed Labs: ordered. Radiology: ordered.   58 yO M with a chief complaints of needing dialysis.  Patient gets dialysis Monday Wednesday and Friday.  Last dialyzed to the hospital on Friday.  Denies any symptoms.  Declining blood work.  He was reportedly wheezing per nursing on the way back to his bed.  Has clear lung sounds for me.  Not obviously fluid overloaded.  Will obtain a chest x-ray.  Will discuss with nephrology for dialysis.  Discussed with Dr. Gearline.  Patient up to the dialysis unit.  Likely reassess and discharge  The patients results and plan were reviewed and discussed.   Any x-rays performed were independently reviewed by myself.   Differential diagnosis were considered with the presenting HPI.  Medications  Chlorhexidine  Gluconate Cloth 2 % PADS 6 each (has no administration in time range)    Vitals:   02/09/24 1315 02/09/24 1330 02/09/24 1400 02/09/24 1430  BP: (!) 169/107 (!) 163/101 (!) 150/89 (!) 169/82  Pulse: 75  65   Resp: 14 13 13 13   Temp:      TempSrc:      SpO2: 100%  99%   Weight:      Height:        Final diagnoses:  Encounter for dialysis    Admission/ observation were discussed with the admitting physician, patient and/or family and they are comfortable with the plan.       Final diagnoses:  Encounter for dialysis    ED Discharge Orders     None          [name], DO 02/09/24 1441

## 2024-02-11 ENCOUNTER — Emergency Department (HOSPITAL_COMMUNITY): Admission: EM | Admit: 2024-02-11 | Discharge: 2024-02-12 | Discharge status: Against Medical Advice

## 2024-02-11 ENCOUNTER — Other Ambulatory Visit: Payer: Self-pay

## 2024-02-11 DIAGNOSIS — Z5329 Procedure and treatment not carried out because of patient's decision for other reasons: Secondary | ICD-10-CM | POA: Diagnosis not present

## 2024-02-11 DIAGNOSIS — I120 Hypertensive chronic kidney disease with stage 5 chronic kidney disease or end stage renal disease: Secondary | ICD-10-CM | POA: Diagnosis not present

## 2024-02-11 DIAGNOSIS — Z992 Dependence on renal dialysis: Secondary | ICD-10-CM | POA: Insufficient documentation

## 2024-02-11 DIAGNOSIS — N186 End stage renal disease: Secondary | ICD-10-CM | POA: Diagnosis not present

## 2024-02-11 LAB — CBC WITH DIFFERENTIAL/PLATELET
Abs Immature Granulocytes: 0.02 K/uL (ref 0.00–0.07)
Basophils Absolute: 0 K/uL (ref 0.0–0.1)
Basophils Relative: 1 %
Eosinophils Absolute: 0.2 K/uL (ref 0.0–0.5)
Eosinophils Relative: 4 %
HCT: 29.6 % — ABNORMAL LOW (ref 39.0–52.0)
Hemoglobin: 9.9 g/dL — ABNORMAL LOW (ref 13.0–17.0)
Immature Granulocytes: 0 %
Lymphocytes Relative: 18 %
Lymphs Abs: 1 K/uL (ref 0.7–4.0)
MCH: 30.5 pg (ref 26.0–34.0)
MCHC: 33.4 g/dL (ref 30.0–36.0)
MCV: 91.1 fL (ref 80.0–100.0)
Monocytes Absolute: 0.9 K/uL (ref 0.1–1.0)
Monocytes Relative: 17 %
Neutro Abs: 3.3 K/uL (ref 1.7–7.7)
Neutrophils Relative %: 60 %
Platelets: 229 K/uL (ref 150–400)
RBC: 3.25 MIL/uL — ABNORMAL LOW (ref 4.22–5.81)
RDW: 18 % — ABNORMAL HIGH (ref 11.5–15.5)
WBC: 5.4 K/uL (ref 4.0–10.5)
nRBC: 0 % (ref 0.0–0.2)

## 2024-02-11 LAB — I-STAT CHEM 8, ED
BUN: 57 mg/dL — ABNORMAL HIGH (ref 6–20)
Calcium, Ion: 0.86 mmol/L — CL (ref 1.15–1.40)
Chloride: 100 mmol/L (ref 98–111)
Creatinine, Ser: 13.4 mg/dL — ABNORMAL HIGH (ref 0.61–1.24)
Glucose, Bld: 111 mg/dL — ABNORMAL HIGH (ref 70–99)
HCT: 30 % — ABNORMAL LOW (ref 39.0–52.0)
Hemoglobin: 10.2 g/dL — ABNORMAL LOW (ref 13.0–17.0)
Potassium: 4.7 mmol/L (ref 3.5–5.1)
Sodium: 138 mmol/L (ref 135–145)
TCO2: 24 mmol/L (ref 22–32)

## 2024-02-11 MED ORDER — DARBEPOETIN ALFA 150 MCG/0.3ML IJ SOSY
150.0000 ug | PREFILLED_SYRINGE | Freq: Once | INTRAMUSCULAR | Status: DC
Start: 2024-02-11 — End: 2024-02-12
  Filled 2024-02-11: qty 0.3

## 2024-02-11 MED ORDER — SODIUM CHLORIDE 0.9 % IV SOLN
125.0000 mg | INTRAVENOUS | Status: DC
  Filled 2024-02-11: qty 10

## 2024-02-11 MED ORDER — CHLORHEXIDINE GLUCONATE CLOTH 2 % EX PADS: 6.0000 | MEDICATED_PAD | Freq: Every day | CUTANEOUS | Status: DC

## 2024-02-11 MED ORDER — HEPARIN SODIUM (PORCINE) 1000 UNIT/ML IJ SOLN
INTRAMUSCULAR | Status: AC
  Filled 2024-02-11: qty 2

## 2024-02-11 MED ORDER — HEPARIN SODIUM (PORCINE) 1000 UNIT/ML DIALYSIS
2000.0000 [IU] | INTRAMUSCULAR | Status: DC | PRN
  Administered 2024-02-11: 2000 [IU] via INTRAVENOUS_CENTRAL

## 2024-02-11 NOTE — ED Triage Notes (Signed)
 Pt. Here for dialysis , completed time on Monday. No symptoms today

## 2024-02-11 NOTE — ED Provider Notes (Signed)
 Patient had eloped after returning from dialysis unit prior to being evaluated by me.   Hildegard Loge, PA-C 02/11/24 1900    Tegeler, Lonni PARAS, MD 02/11/24 [phone]

## 2024-02-11 NOTE — Progress Notes (Signed)
   02/11/24 1708  Vitals  Temp 98.1 F (36.7 C)  Pulse Rate 72  Resp 15  BP 125/79  SpO2 98 %  O2 Device Room Air  Weight  (stretcher)  Post Treatment  Dialyzer Clearance Lightly streaked  Hemodialysis Intake (mL) 0 mL  Liters Processed 84  Fluid Removed (mL) 2500 mL  Tolerated HD Treatment Yes  AVG/AVF Arterial Site Held (minutes) 10 minutes  AVG/AVF Venous Site Held (minutes) 10 minutes   Received patient in bed to unit.  Alert and oriented.  Informed consent signed and in chart.   TX duration:3.5hrs  Patient tolerated well.  Transported back to the room  Alert, without acute distress.  Hand-off given to patient's nurse.   Access used: RAVF Access issues: none  Total UF removed: 2.5L Medication(s) given: none   Na'Shaminy T [facility]

## 2024-02-11 NOTE — ED Provider Notes (Signed)
 [place] EMERGENCY DEPARTMENT AT [facility] Provider Note   CSN: [id number] Arrival date & time: 02/11/24  9350     Patient presents with: Vascular Access Problem   Paul [name] is a 58 y.o. male.   58 year old male presents for dialysis.  Last dialysis on Monday.  He is in the ER regularly for dialysis sessions.  He has no other complaints or concerns at this time.        Prior to Admission medications   Medication Sig Start Date End Date Taking? Authorizing Provider  ARIPiprazole  (ABILIFY ) 5 MG tablet Take 1 tablet (5 mg total) by mouth at bedtime. 10/28/23   Plovsky, Elna, MD  carvedilol  (COREG ) 6.25 MG tablet Take 1 tablet (6.25 mg total) by mouth 2 (two) times daily with a meal. 01/13/24   Delbert Clam, MD  isosorbide  mononitrate (IMDUR ) 30 MG 24 hr tablet Take 1 tablet (30 mg total) by mouth daily. 10/25/23   [name], MD  OVER THE COUNTER MEDICATION Take 1 capsule by mouth daily. Lion's Mane for memory    [provider]  rosuvastatin  (CRESTOR ) 5 MG tablet Take 1 tablet (5 mg total) by mouth daily. 03/24/23     sucroferric oxyhydroxide (VELPHORO ) 500 MG chewable tablet Chew 3 tablets (1,500 mg total) by mouth 3 (three) times daily with meals. 07/29/23   Tawkaliyar, Roya, DO    Allergies: Penicillin g    Review of Systems  Constitutional:  Negative for chills and fever.  HENT:  Negative for ear pain and sore throat.   Eyes:  Negative for pain and visual disturbance.  Respiratory:  Negative for cough and shortness of breath.   Cardiovascular:  Negative for chest pain and palpitations.  Gastrointestinal:  Negative for abdominal pain and vomiting.  Genitourinary:  Negative for dysuria and hematuria.  Musculoskeletal:  Negative for arthralgias and back pain.  Skin:  Negative for color change and rash.  Neurological:  Negative for seizures and syncope.  All other systems reviewed and are negative.   Updated Vital Signs BP (!)  162/102 (BP Location: Left Arm)   Pulse 87   Temp 97.8 F (36.6 C)   Resp 18   SpO2 96%   Physical Exam Vitals and nursing note reviewed.  Constitutional:      General: He is not in acute distress.    Appearance: He is well-developed.  HENT:     Head: Normocephalic and atraumatic.  Eyes:     Conjunctiva/sclera: Conjunctivae normal.  Cardiovascular:     Rate and Rhythm: Normal rate and regular rhythm.     Heart sounds: No murmur heard. Pulmonary:     Effort: Pulmonary effort is normal. No respiratory distress.     Breath sounds: Normal breath sounds.  Abdominal:     Palpations: Abdomen is soft.     Tenderness: There is no abdominal tenderness.  Musculoskeletal:        General: No swelling.     Cervical back: Neck supple.  Skin:    General: Skin is warm and dry.     Capillary Refill: Capillary refill takes less than 2 seconds.  Neurological:     Mental Status: He is alert.  Psychiatric:        Mood and Affect: Mood normal.     (all labs ordered are listed, but only abnormal results are displayed) Labs Reviewed  HEPATITIS B SURFACE ANTIGEN  HEPATITIS B SURFACE ANTIBODY, QUANTITATIVE  CBC WITH DIFFERENTIAL/PLATELET  I-STAT CHEM 8,  ED    EKG: None  Radiology: No results found.   Procedures   Medications Ordered in the ED  Chlorhexidine  Gluconate Cloth 2 % PADS 6 each (0 each Topical Hold 02/11/24 0823)  Darbepoetin Alfa  (ARANESP ) injection 150 mcg (has no administration in time range)  ferric gluconate (FERRLECIT) 125 mg in sodium chloride  0.9 % 100 mL IVPB (has no administration in time range)                                    Medical Decision Making Social determinants of health: patient has no outpatient dialysis chair   I spoke with Dr. Gearline, nephrology, she is aware of the patient. She will plan to give him IV iron  with his dialysis today. Plan will likely be to dialyze and discharge home after. Patient otherwise stable with no other complaints.    Problems Addressed: ESRD (end stage renal disease) (HCC): chronic illness or injury  Amount and/or Complexity of Data Reviewed External Data Reviewed: notes.    Details: Prior ED records reviewed - patient here 10/6 for similar  Labs: ordered. Discussion of management or test interpretation with external provider(s): Dr. Gearline - nephrology - I spoke with her on the phone regarding the patient and plan will be for dialysis   Risk OTC drugs. Prescription drug management. Diagnosis or treatment significantly limited by social determinants of health.     Final diagnoses:  ESRD (end stage renal disease) [facility])    ED Discharge Orders     None          Gennaro Duwaine CROME, DO 02/11/24 0830

## 2024-02-13 ENCOUNTER — Other Ambulatory Visit: Payer: Self-pay

## 2024-02-13 ENCOUNTER — Emergency Department (HOSPITAL_COMMUNITY)
Admission: EM | Admit: 2024-02-13 | Discharge: 2024-02-13 | Discharge status: Against Medical Advice | Attending: Nephrology | Admitting: Nephrology

## 2024-02-13 ENCOUNTER — Encounter (HOSPITAL_COMMUNITY): Payer: Self-pay

## 2024-02-13 DIAGNOSIS — Z5321 Procedure and treatment not carried out due to patient leaving prior to being seen by health care provider: Secondary | ICD-10-CM | POA: Insufficient documentation

## 2024-02-13 DIAGNOSIS — R059 Cough, unspecified: Secondary | ICD-10-CM | POA: Diagnosis present

## 2024-02-13 LAB — RENAL FUNCTION PANEL
Albumin: 3.6 g/dL (ref 3.5–5.0)
Anion gap: 20 — ABNORMAL HIGH (ref 5–15)
BUN: 55 mg/dL — ABNORMAL HIGH (ref 6–20)
CO2: 20 mmol/L — ABNORMAL LOW (ref 22–32)
Calcium: 7.1 mg/dL — ABNORMAL LOW (ref 8.9–10.3)
Chloride: 96 mmol/L — ABNORMAL LOW (ref 98–111)
Creatinine, Ser: 11.74 mg/dL — ABNORMAL HIGH (ref 0.61–1.24)
GFR, Estimated: 4 mL/min — ABNORMAL LOW (ref >60)
Glucose, Bld: 101 mg/dL — ABNORMAL HIGH (ref 70–99)
Phosphorus: >30 mg/dL — ABNORMAL HIGH (ref 2.5–4.6)
Potassium: 4.3 mmol/L (ref 3.5–5.1)
Sodium: 136 mmol/L (ref 135–145)

## 2024-02-13 LAB — CBC
HCT: 28.7 % — ABNORMAL LOW (ref 39.0–52.0)
Hemoglobin: 9.5 g/dL — ABNORMAL LOW (ref 13.0–17.0)
MCH: 30 pg (ref 26.0–34.0)
MCHC: 33.1 g/dL (ref 30.0–36.0)
MCV: 90.5 fL (ref 80.0–100.0)
Platelets: 205 K/uL (ref 150–400)
RBC: 3.17 MIL/uL — ABNORMAL LOW (ref 4.22–5.81)
RDW: 17.7 % — ABNORMAL HIGH (ref 11.5–15.5)
WBC: 5.1 K/uL (ref 4.0–10.5)
nRBC: 0 % (ref 0.0–0.2)

## 2024-02-13 MED ORDER — CHLORHEXIDINE GLUCONATE CLOTH 2 % EX PADS: 6.0000 | MEDICATED_PAD | Freq: Every day | CUTANEOUS | Status: DC

## 2024-02-13 NOTE — Progress Notes (Signed)
 Received patient in bed to unit.  Alert and oriented.  Informed consent signed and in chart.   TX duration:3 hours  Patient tolerated well.  Transported back to the room  Alert, without acute distress.  Hand-off given to patient's nurse.   Access used: Right upper arm fistula Access issues: none  Total UF removed: 2L Medication(s) given: none   02/13/24 1746  Vitals  Temp 98.2 F (36.8 C)  Temp Source Oral  BP (!) 147/91  Pulse Rate 80  Resp 14  Type of Weight Post-Dialysis  Oxygen Therapy  SpO2 100 %  O2 Device Room Air  Patient Activity (if Appropriate) In bed  Pulse Oximetry Type Continuous  Oximetry Probe Site Changed No  During Treatment Monitoring  Dialysate Potassium Concentration 3  Dialysate Calcium  Concentration 2.5  Duration of HD Treatment -hour(s) 3 hour(s)  HD Safety Checks Performed Yes  Intra-Hemodialysis Comments Tx completed  Post Treatment  Dialyzer Clearance Lightly streaked  Liters Processed 72  Fluid Removed (mL) 2000 mL  Tolerated HD Treatment Yes  AVG/AVF Arterial Site Held (minutes) 7 minutes  AVG/AVF Venous Site Held (minutes) 7 minutes  Fistula / Graft Right Upper arm Arteriovenous fistula  No placement date or time found.   Placed prior to admission: Yes  Orientation: Right  Access Location: Upper arm  Access Type: Arteriovenous fistula  Site Condition No complications  Fistula / Graft Assessment Present;Bruit;Thrill  Status Deaccessed;Patent  Drainage Description None     Paul Brasil LPN [facility]

## 2024-02-13 NOTE — ED Triage Notes (Signed)
 Pt here for dialysis; endorses recent mild non productive cough; denies fevers

## 2024-02-13 NOTE — ED Notes (Signed)
 Pt left without waiting for provider or discharge paperowrk

## 2024-02-16 ENCOUNTER — Emergency Department (HOSPITAL_COMMUNITY)
Admission: EM | Admit: 2024-02-16 | Discharge: 2024-02-16 | Discharge status: Against Medical Advice | Attending: Emergency Medicine | Admitting: Emergency Medicine

## 2024-02-16 ENCOUNTER — Other Ambulatory Visit: Payer: Self-pay

## 2024-02-16 ENCOUNTER — Encounter (HOSPITAL_COMMUNITY): Payer: Self-pay

## 2024-02-16 DIAGNOSIS — Z992 Dependence on renal dialysis: Secondary | ICD-10-CM | POA: Diagnosis not present

## 2024-02-16 DIAGNOSIS — D631 Anemia in chronic kidney disease: Secondary | ICD-10-CM | POA: Insufficient documentation

## 2024-02-16 DIAGNOSIS — N186 End stage renal disease: Secondary | ICD-10-CM | POA: Insufficient documentation

## 2024-02-16 DIAGNOSIS — Z5329 Procedure and treatment not carried out because of patient's decision for other reasons: Secondary | ICD-10-CM | POA: Diagnosis not present

## 2024-02-16 DIAGNOSIS — I120 Hypertensive chronic kidney disease with stage 5 chronic kidney disease or end stage renal disease: Secondary | ICD-10-CM | POA: Diagnosis not present

## 2024-02-16 LAB — CBC WITH DIFFERENTIAL/PLATELET
Abs Immature Granulocytes: 0.02 K/uL (ref 0.00–0.07)
Basophils Absolute: 0 K/uL (ref 0.0–0.1)
Basophils Relative: 1 %
Eosinophils Absolute: 0.3 K/uL (ref 0.0–0.5)
Eosinophils Relative: 5 %
HCT: 30.8 % — ABNORMAL LOW (ref 39.0–52.0)
Hemoglobin: 10 g/dL — ABNORMAL LOW (ref 13.0–17.0)
Immature Granulocytes: 0 %
Lymphocytes Relative: 21 %
Lymphs Abs: 1.1 K/uL (ref 0.7–4.0)
MCH: 29.9 pg (ref 26.0–34.0)
MCHC: 32.5 g/dL (ref 30.0–36.0)
MCV: 92.2 fL (ref 80.0–100.0)
Monocytes Absolute: 0.5 K/uL (ref 0.1–1.0)
Monocytes Relative: 10 %
Neutro Abs: 3.5 K/uL (ref 1.7–7.7)
Neutrophils Relative %: 63 %
Platelets: 205 K/uL (ref 150–400)
RBC: 3.34 MIL/uL — ABNORMAL LOW (ref 4.22–5.81)
RDW: 17.6 % — ABNORMAL HIGH (ref 11.5–15.5)
WBC: 5.4 K/uL (ref 4.0–10.5)
nRBC: 0 % (ref 0.0–0.2)

## 2024-02-16 LAB — COMPREHENSIVE METABOLIC PANEL WITH GFR
ALT: 19 U/L (ref 0–44)
AST: 14 U/L — ABNORMAL LOW (ref 15–41)
Albumin: 3.8 g/dL (ref 3.5–5.0)
Alkaline Phosphatase: 73 U/L (ref 38–126)
Anion gap: 19 — ABNORMAL HIGH (ref 5–15)
BUN: 71 mg/dL — ABNORMAL HIGH (ref 6–20)
CO2: 19 mmol/L — ABNORMAL LOW (ref 22–32)
Calcium: 7 mg/dL — ABNORMAL LOW (ref 8.9–10.3)
Chloride: 101 mmol/L (ref 98–111)
Creatinine, Ser: 14 mg/dL — ABNORMAL HIGH (ref 0.61–1.24)
GFR, Estimated: 4 mL/min — ABNORMAL LOW (ref >60)
Glucose, Bld: 76 mg/dL (ref 70–99)
Potassium: 5.1 mmol/L (ref 3.5–5.1)
Sodium: 139 mmol/L (ref 135–145)
Total Bilirubin: 1 mg/dL (ref 0.0–1.2)
Total Protein: 7.5 g/dL (ref 6.5–8.1)

## 2024-02-16 MED ORDER — ALTEPLASE 2 MG IJ SOLR: 2.0000 mg | Freq: Once | INTRAMUSCULAR | Status: DC | PRN

## 2024-02-16 MED ORDER — PENTAFLUOROPROP-TETRAFLUOROETH EX AERO: 1.0000 | INHALATION_SPRAY | CUTANEOUS | Status: DC | PRN

## 2024-02-16 MED ORDER — ANTICOAGULANT SODIUM CITRATE 4% (200MG/5ML) IV SOLN: 5.0000 mL | Status: DC | PRN

## 2024-02-16 MED ORDER — HEPARIN SODIUM (PORCINE) 1000 UNIT/ML IJ SOLN
INTRAMUSCULAR | Status: AC
  Filled 2024-02-16: qty 3

## 2024-02-16 MED ORDER — HEPARIN SODIUM (PORCINE) 1000 UNIT/ML DIALYSIS
2400.0000 [IU] | Freq: Once | INTRAMUSCULAR | Status: AC
  Administered 2024-02-16: 2400 [IU] via INTRAVENOUS_CENTRAL
  Filled 2024-02-16: qty 3

## 2024-02-16 MED ORDER — LIDOCAINE HCL (PF) 1 % IJ SOLN: 5.0000 mL | INTRAMUSCULAR | Status: DC | PRN

## 2024-02-16 MED ORDER — HEPARIN SODIUM (PORCINE) 1000 UNIT/ML DIALYSIS: 2000.0000 [IU] | INTRAMUSCULAR | Status: DC | PRN

## 2024-02-16 MED ORDER — HEPARIN SODIUM (PORCINE) 1000 UNIT/ML DIALYSIS: 1000.0000 [IU] | INTRAMUSCULAR | Status: DC | PRN

## 2024-02-16 MED ORDER — CHLORHEXIDINE GLUCONATE CLOTH 2 % EX PADS: 6.0000 | MEDICATED_PAD | Freq: Every day | CUTANEOUS | Status: DC

## 2024-02-16 MED ORDER — LIDOCAINE-PRILOCAINE 2.5-2.5 % EX CREA: 1.0000 | TOPICAL_CREAM | CUTANEOUS | Status: DC | PRN

## 2024-02-16 MED ORDER — LIDOCAINE-PRILOCAINE 2.5-2.5 % EX CREA
1.0000 | TOPICAL_CREAM | CUTANEOUS | Status: DC | PRN
Start: 2024-02-16 — End: 2024-02-16

## 2024-02-16 MED ORDER — NEPRO/CARBSTEADY PO LIQD: 237.0000 mL | ORAL | Status: DC | PRN

## 2024-02-16 MED ORDER — SODIUM CHLORIDE 0.9 % IV SOLN
200.0000 mg | Freq: Once | INTRAVENOUS | Status: DC
  Filled 2024-02-16: qty 10

## 2024-02-16 MED ORDER — IRON SUCROSE 200 MG IVPB - SIMPLE MED
200.0000 mg | Freq: Once | Status: DC
  Filled 2024-02-16 (×2): qty 110

## 2024-02-16 MED ORDER — DARBEPOETIN ALFA 150 MCG/0.3ML IJ SOSY
150.0000 ug | PREFILLED_SYRINGE | Freq: Once | INTRAMUSCULAR | Status: AC
  Administered 2024-02-16: 150 ug via SUBCUTANEOUS
  Filled 2024-02-16 (×2): qty 0.3

## 2024-02-16 NOTE — Progress Notes (Signed)
 Pt did not receive ordered 200mg  venofer  dose IV, notified MD-[facility]. Medication will be given on Wednesday when pt returns.

## 2024-02-16 NOTE — ED Provider Notes (Signed)
 [place] EMERGENCY DEPARTMENT AT [facility] Provider Note   CSN: [id number] Arrival date & time: 02/16/24  1016     Patient presents with: needs dialysis   Paul [name] is a 58 y.o. male.   58 year old male history of ESRD on MWF IHD who presents emergency department for dialysis.  Patient presents for his routine dialysis.  Denies any symptoms including chest pain, shortness of breath, or cramping.  Last dialysis was on Friday.       Prior to Admission medications   Medication Sig Start Date End Date Taking? Authorizing Provider  ARIPiprazole  (ABILIFY ) 5 MG tablet Take 1 tablet (5 mg total) by mouth at bedtime. 10/28/23   Plovsky, Elna, MD  carvedilol  (COREG ) 6.25 MG tablet Take 1 tablet (6.25 mg total) by mouth 2 (two) times daily with a meal. 01/13/24   Delbert Clam, MD  isosorbide  mononitrate (IMDUR ) 30 MG 24 hr tablet Take 1 tablet (30 mg total) by mouth daily. 10/25/23   [name], MD  OVER THE COUNTER MEDICATION Take 1 capsule by mouth daily. Lion's Mane for memory    [provider]  rosuvastatin  (CRESTOR ) 5 MG tablet Take 1 tablet (5 mg total) by mouth daily. 03/24/23     sucroferric oxyhydroxide (VELPHORO ) 500 MG chewable tablet Chew 3 tablets (1,500 mg total) by mouth 3 (three) times daily with meals. 07/29/23   Tawkaliyar, Roya, DO    Allergies: Penicillin g    Review of Systems  Updated Vital Signs BP (!) 166/103 (BP Location: Left Arm)   Pulse 81   Temp 97.8 F (36.6 C)   Resp 15   Ht 5' 3 (1.6 m)   Wt 82.7 kg   SpO2 98%   BMI 32.30 kg/m   Physical Exam Constitutional:      Appearance: Normal appearance.  Cardiovascular:     Rate and Rhythm: Normal rate and regular rhythm.     Pulses: Normal pulses.     Heart sounds: Normal heart sounds.     Comments: Fistula in right upper extremity with bruit and thrill Pulmonary:     Effort: Pulmonary effort is normal.     Breath sounds: Normal breath sounds.   Musculoskeletal:     Right lower leg: No edema.     Left lower leg: No edema.  Neurological:     Mental Status: He is alert.     (all labs ordered are listed, but only abnormal results are displayed) Labs Reviewed  CBC WITH DIFFERENTIAL/PLATELET - Abnormal; Notable for the following components:      Result Value   RBC 3.34 (*)    Hemoglobin 10.0 (*)    HCT 30.8 (*)    RDW 17.6 (*)    All other components within normal limits  COMPREHENSIVE METABOLIC PANEL WITH GFR - Abnormal; Notable for the following components:   CO2 19 (*)    BUN 71 (*)    Creatinine, Ser 14.00 (*)    Calcium  7.0 (*)    AST 14 (*)    GFR, Estimated 4 (*)    Anion gap 19 (*)    All other components within normal limits  HEPATITIS B SURFACE ANTIGEN  HEPATITIS B SURFACE ANTIBODY, QUANTITATIVE  RENAL FUNCTION PANEL  CBC    EKG: EKG Interpretation Date/Time:  Monday February 16 2024 11:15:18 EDT Ventricular Rate:  75 PR Interval:  202 QRS Duration:  96 QT Interval:  444 QTC Calculation: 495 R Axis:   -49  Text Interpretation: Normal sinus rhythm Left axis deviation Prolonged QT Abnormal ECG When compared with ECG of 09-Jan-2024 06:59, PREVIOUS ECG IS PRESENT Confirmed by Yolande Charleston [phone]) on 02/16/2024 12:29:52 PM  Radiology: No results found.   Procedures   Medications Ordered in the ED  Chlorhexidine  Gluconate Cloth 2 % PADS 6 each (has no administration in time range)  pentafluoroprop-tetrafluoroeth (GEBAUERS) aerosol 1 Application (has no administration in time range)  lidocaine  (PF) (XYLOCAINE ) 1 % injection 5 mL (has no administration in time range)  lidocaine -prilocaine  (EMLA ) cream 1 Application (has no administration in time range)  feeding supplement (NEPRO CARB STEADY) liquid 237 mL (has no administration in time range)  heparin  injection 1,000 Units (has no administration in time range)  anticoagulant sodium citrate  solution 5 mL (has no administration in time range)   alteplase  (CATHFLO ACTIVASE ) injection 2 mg (has no administration in time range)  Darbepoetin Alfa  (ARANESP ) injection 150 mcg (has no administration in time range)  heparin  injection 2,400 Units (has no administration in time range)  iron  sucrose (VENOFER ) 200 mg in sodium chloride  0.9 % 100 mL IVPB (has no administration in time range)  pentafluoroprop-tetrafluoroeth (GEBAUERS) aerosol 1 Application (has no administration in time range)  lidocaine  (PF) (XYLOCAINE ) 1 % injection 5 mL (has no administration in time range)  lidocaine -prilocaine  (EMLA ) cream 1 Application (has no administration in time range)  heparin  injection 1,000 Units (has no administration in time range)  anticoagulant sodium citrate  solution 5 mL (has no administration in time range)  alteplase  (CATHFLO ACTIVASE ) injection 2 mg (has no administration in time range)  pentafluoroprop-tetrafluoroeth (GEBAUERS) aerosol 1 Application (has no administration in time range)  lidocaine  (PF) (XYLOCAINE ) 1 % injection 5 mL (has no administration in time range)  lidocaine -prilocaine  (EMLA ) cream 1 Application (has no administration in time range)  heparin  injection 1,000 Units (has no administration in time range)  anticoagulant sodium citrate  solution 5 mL (has no administration in time range)  alteplase  (CATHFLO ACTIVASE ) injection 2 mg (has no administration in time range)  pentafluoroprop-tetrafluoroeth (GEBAUERS) aerosol 1 Application (has no administration in time range)  lidocaine  (PF) (XYLOCAINE ) 1 % injection 5 mL (has no administration in time range)  lidocaine -prilocaine  (EMLA ) cream 1 Application (has no administration in time range)  heparin  injection 1,000 Units (has no administration in time range)  anticoagulant sodium citrate  solution 5 mL (has no administration in time range)  alteplase  (CATHFLO ACTIVASE ) injection 2 mg (has no administration in time range)  heparin  injection 2,000 Units (has no administration in  time range)                                    Medical Decision Making Amount and/or Complexity of Data Reviewed Labs: ordered. ECG/medicine tests: ordered.   58 year old male history of ESRD on MWF IHD who presents to the emergency department for routine dialysis  Initial Ddx:  Dialysis, volume overload, electrolyte abnormality  MDM/Course:  Patient presents emergency department for routine dialysis.  Apparently got dialysis Friday so low concern for significant electrolyte abnormality.  Does not appear to be volume overloaded on exam and is satting well on room air.  EKG with prolonged QT that is actually improved from prior.  No other signs of hyperkalemia.  Discussed with Dr. Geralynn from nephrology who is arranging for dialysis now.  Will reevaluate in in the emergency department after dialysis.  This patient presents to the  ED for concern of complaints listed in HPI, this involves an extensive number of treatment options, and is a complaint that carries with it a high risk of complications and morbidity. Disposition including potential need for admission considered.   Dispo: Dialysis  Records reviewed ED Visit Notes The following labs were independently interpreted: Chemistry and show anion gap metabolic acidosis and elevated BUN I personally reviewed and interpreted the pt's EKG: see above for interpretation  I have reviewed the patients home medications and made adjustments as needed Consults: Nephrology  Portions of this note were generated with Dragon dictation software. Dictation errors may occur despite best attempts at proofreading.    Final diagnoses:  ESRD on dialysis [facility])    ED Discharge Orders     None          Yolande Lamar BROCKS, MD 02/16/24 1231

## 2024-02-16 NOTE — ED Triage Notes (Signed)
 Pt here for dialysis, denies any other complaints.

## 2024-02-16 NOTE — Progress Notes (Signed)
 Asked to see this patient for hospital dialysis. Pt was discharged from his outpatient unit for behavior issues. The plan will be for ED HD. Pt is not to be admitted at this time. Pt will go to the dialysis unit when they are ready for the patient. When dialysis is completed pt will be sent back to ED for reassessment.   Vitals:   02/16/24 1032 02/16/24 1059  BP: (!) 166/103   Pulse: 81   Resp: 15   Temp: 97.8 F (36.6 C)   SpO2: 98%   Weight:  82.7 kg  Height:  5' 3 (1.6 m)   Last OP HD unit info from 07/2023 -->  4h  B400 76.5kg  2K bath RUA AVF Heparin  2400    Clinical issues other than volume: 1) Anemia of esrd:  - pt dose not have a nephrologist or HD unit so does not have access to ESA or IV iron , other than when here for hospital HD sessions - last tsat 24% and ferritin 993 on 12/12/23, consider IV Fe - recent Hb around 9.5- 11 - last 3 doses of darbepoeitin here -->   - 150 mcg 8/25 - 100 mcg 9/19 - 150 mcg 10/01  Plan- needs darbe 150 mcg sq w/ HD, and also IV fe venofer  200mg , will order both for today  I was present at the procedure, reviewed the HD regimen and made appropriate changes.   Myer Fret MD  [facility] 02/16/2024, 11:38 AM    Recent Labs  Lab 02/11/24 0832 02/13/24 1430 02/16/24 1109  HGB 10.2* 9.5* 10.0*  ALBUMIN  --  3.6  --   CALCIUM   --  7.1*  --   PHOS  --  >30.0*  --   CREATININE 13.40* 11.74*  --   K 4.7 4.3  --    No results for input(s): IRON , TIBC, FERRITIN in the last 168 hours. Inpatient medications:

## 2024-02-17 ENCOUNTER — Ambulatory Visit (INDEPENDENT_AMBULATORY_CARE_PROVIDER_SITE_OTHER): Admitting: Family Medicine

## 2024-02-17 VITALS — BP 124/78 | HR 82 | Ht 63.0 in | Wt 184.2 lb

## 2024-02-17 DIAGNOSIS — N186 End stage renal disease: Secondary | ICD-10-CM | POA: Diagnosis not present

## 2024-02-17 DIAGNOSIS — Z992 Dependence on renal dialysis: Secondary | ICD-10-CM

## 2024-02-17 NOTE — Progress Notes (Unsigned)
 Established Patient Office Visit  Subjective    Patient ID: Paul [name], male    DOB: 1965-10-24  Age: 58 y.o. MRN: [id number]  CC:  Chief Complaint  Patient presents with   Hospitalization Follow-up    Needs referral to nephrologist / dialysis clinic. Is requesting [facility]    HPI Paul [name] presents wanting to be referred to nephrology for further eval/management and options regarding his kidneys and dialysis. Patient is presently taking dialysis at [facility]. He would like to consider peritoneal dialysis. Patient denies acute complaints.   Outpatient Encounter Medications as of 02/17/2024  Medication Sig   ARIPiprazole  (ABILIFY ) 5 MG tablet Take 1 tablet (5 mg total) by mouth at bedtime.   carvedilol  (COREG ) 6.25 MG tablet Take 1 tablet (6.25 mg total) by mouth 2 (two) times daily with a meal.   isosorbide  mononitrate (IMDUR ) 30 MG 24 hr tablet Take 1 tablet (30 mg total) by mouth daily.   OVER THE COUNTER MEDICATION Take 1 capsule by mouth daily. Lion's Mane for memory   rosuvastatin  (CRESTOR ) 5 MG tablet Take 1 tablet (5 mg total) by mouth daily.   sucroferric oxyhydroxide (VELPHORO ) 500 MG chewable tablet Chew 3 tablets (1,500 mg total) by mouth 3 (three) times daily with meals. (Patient not taking: Reported on 02/17/2024)   No facility-administered encounter medications on file as of 02/17/2024.    Past Medical History:  Diagnosis Date   Bipolar disorder (HCC)    Central retinal artery occlusion    with macular infarction of the right eye. status post posterior vitrectomy and photocoagulation with insertion of a shunt in 2006.   CHF (congestive heart failure) (HCC)    chronic mixed syst/diast. 02/2009 echo: Systolic function was mildly reduced. The estimated ejection fraction was in 45%, global HK, Grade I Diast dysfxn.   Chronic kidney disease    ESRD   Heart failure    Obesity    Osteoarthritis    s/p right hip replacement in 2001    Schizophrenia (HCC)    Severe uncontrolled hypertension     Past Surgical History:  Procedure Laterality Date   A/V FISTULAGRAM Right 02/05/2024   Procedure: A/V Fistulagram;  Surgeon: Serene Gaile ORN, MD;  Location: [facility];  Service: Cardiovascular;  Laterality: Right;   A/V SHUNT INTERVENTION N/A 10/08/2023   Procedure: A/V SHUNT INTERVENTION;  Surgeon: Sheree Penne Bruckner, MD;  Location: [facility];  Service: Cardiovascular;  Laterality: N/A;   AV FISTULA PLACEMENT Right 07/08/2022   Procedure: RIGHT RADIOCEPHALIC ARTERIOVENOUS (AV) FISTULA CREATION;  Surgeon: Lanis Fonda BRAVO, MD;  Location: [facility] OR;  Service: Vascular;  Laterality: Right;   IR FLUORO GUIDE CV LINE RIGHT  05/13/2022   IR US  GUIDE VASC ACCESS RIGHT  05/13/2022   JOINT REPLACEMENT     right hip replacement   right eye surgery     for glaucoma   TOTAL HIP ARTHROPLASTY Bilateral 1997, 2001   UPPER EXTREMITY INTERVENTION  10/08/2023   Procedure: UPPER EXTREMITY INTERVENTION;  Surgeon: Sheree Penne Bruckner, MD;  Location: [facility];  Service: Cardiovascular;;  Stent   VENOUS ANGIOPLASTY  10/08/2023   Procedure: VENOUS ANGIOPLASTY;  Surgeon: Sheree Penne Bruckner, MD;  Location: [facility];  Service: Cardiovascular;;   VENOUS ANGIOPLASTY Right 02/05/2024   Procedure: VENOUS ANGIOPLASTY;  Surgeon: Serene Gaile ORN, MD;  Location: [facility];  Service: Cardiovascular;  Laterality: Right;  Cephalic Vein; Cephalic Arch    Family History  Problem Relation Age of Onset   Hypertension Father    Stroke Father     Social History   Socioeconomic History   Marital status: Legally Separated    Spouse name: Not on file   Number of children: Not on file   Years of education: Not on file   Highest education level: Not on file  Occupational History   Occupation: Chartered certified accountant  Tobacco Use   Smoking status: Former   Smokeless tobacco: Never   Tobacco comments:    Quit smoking 20-30 years ago, smoked for 1 year.  Vaping  Use   Vaping status: Never Used  Substance and Sexual Activity   Alcohol use: No   Drug use: No   Sexual activity: Yes    Birth control/protection: None  Other Topics Concern   Not on file  Social History Narrative   Not on file   Social Drivers of Health   Financial Resource Strain: High Risk (09/15/2023)   Overall Financial Resource Strain (CARDIA)    Difficulty of Paying Living Expenses: Very hard  Food Insecurity: No Food Insecurity (09/15/2023)   Hunger Vital Sign    Worried About Running Out of Food in the Last Year: Never true    Ran Out of Food in the Last Year: Never true  Transportation Needs: Unmet Transportation Needs (09/15/2023)   PRAPARE - Transportation    Lack of Transportation (Medical): Yes    Lack of Transportation (Non-Medical): Yes  Physical Activity: Not on file  Stress: No Stress Concern Present (09/15/2023)   [employer] of Occupational Health - Occupational Stress Questionnaire    Feeling of Stress : Only a little  Social Connections: Moderately Integrated (07/01/2023)   Social Connection and Isolation Panel    Frequency of Communication with Friends and Family: Three times a week    Frequency of Social Gatherings with Friends and Family: Never    Attends Religious Services: More than 4 times per year    Active Member of [employer] or Organizations: Yes    Attends Engineer, structural: More than 4 times per year    Marital Status: Divorced  Intimate Partner Violence: Not At Risk (08/26/2023)   Humiliation, Afraid, Rape, and Kick questionnaire    Fear of Current or Ex-Partner: No    Emotionally Abused: No    Physically Abused: No    Sexually Abused: No    Review of Systems  All other systems reviewed and are negative.       Objective    BP 124/78   Pulse 82   Ht 5' 3 (1.6 m)   Wt 184 lb 3.2 oz (83.6 kg)   SpO2 100%   BMI 32.63 kg/m   Physical Exam Vitals and nursing note reviewed.  Constitutional:      General: He is not  in acute distress. Cardiovascular:     Rate and Rhythm: Normal rate and regular rhythm.  Pulmonary:     Effort: Pulmonary effort is normal.     Breath sounds: Normal breath sounds.  Abdominal:     Palpations: Abdomen is soft.     Tenderness: There is no abdominal tenderness.  Neurological:     General: No focal deficit present.     Mental Status: He is alert and oriented to person, place, and time.         Assessment & Plan:   ESRD on dialysis [facility]) -     Ambulatory referral to Nephrology     Return if  symptoms worsen or fail to improve.   Tanda Raguel SQUIBB, MD

## 2024-02-18 ENCOUNTER — Other Ambulatory Visit: Payer: Self-pay

## 2024-02-18 ENCOUNTER — Encounter (HOSPITAL_COMMUNITY): Payer: Self-pay | Admitting: Emergency Medicine

## 2024-02-18 ENCOUNTER — Emergency Department (HOSPITAL_COMMUNITY)
Admission: EM | Admit: 2024-02-18 | Discharge: 2024-02-18 | Discharge status: Against Medical Advice | Attending: Emergency Medicine | Admitting: Emergency Medicine

## 2024-02-18 DIAGNOSIS — I132 Hypertensive heart and chronic kidney disease with heart failure and with stage 5 chronic kidney disease, or end stage renal disease: Secondary | ICD-10-CM | POA: Diagnosis present

## 2024-02-18 DIAGNOSIS — N186 End stage renal disease: Secondary | ICD-10-CM | POA: Diagnosis not present

## 2024-02-18 DIAGNOSIS — Z992 Dependence on renal dialysis: Secondary | ICD-10-CM | POA: Diagnosis not present

## 2024-02-18 DIAGNOSIS — I5020 Unspecified systolic (congestive) heart failure: Secondary | ICD-10-CM | POA: Diagnosis not present

## 2024-02-18 DIAGNOSIS — Z79899 Other long term (current) drug therapy: Secondary | ICD-10-CM | POA: Diagnosis not present

## 2024-02-18 DIAGNOSIS — Z5329 Procedure and treatment not carried out because of patient's decision for other reasons: Secondary | ICD-10-CM | POA: Insufficient documentation

## 2024-02-18 DIAGNOSIS — I120 Hypertensive chronic kidney disease with stage 5 chronic kidney disease or end stage renal disease: Secondary | ICD-10-CM | POA: Diagnosis not present

## 2024-02-18 LAB — GLUCOSE, CAPILLARY: Glucose-Capillary: 85 mg/dL (ref 70–99)

## 2024-02-18 LAB — HEPATITIS B SURFACE ANTIGEN: Hepatitis B Surface Ag: NONREACTIVE

## 2024-02-18 MED ORDER — HEPARIN SODIUM (PORCINE) 1000 UNIT/ML DIALYSIS
2400.0000 [IU] | Freq: Once | INTRAMUSCULAR | Status: AC
  Administered 2024-02-18: 2400 [IU] via INTRAVENOUS_CENTRAL
  Filled 2024-02-18: qty 3

## 2024-02-18 MED ORDER — HEPARIN SODIUM (PORCINE) 1000 UNIT/ML DIALYSIS: 1000.0000 [IU] | INTRAMUSCULAR | Status: DC | PRN

## 2024-02-18 MED ORDER — NEPRO/CARBSTEADY PO LIQD: 237.0000 mL | ORAL | Status: DC | PRN

## 2024-02-18 MED ORDER — ALTEPLASE 2 MG IJ SOLR: 2.0000 mg | Freq: Once | INTRAMUSCULAR | Status: DC | PRN

## 2024-02-18 MED ORDER — LIDOCAINE-PRILOCAINE 2.5-2.5 % EX CREA: 1.0000 | TOPICAL_CREAM | CUTANEOUS | Status: DC | PRN

## 2024-02-18 MED ORDER — ANTICOAGULANT SODIUM CITRATE 4% (200MG/5ML) IV SOLN: 5.0000 mL | Status: DC | PRN

## 2024-02-18 MED ORDER — CHLORHEXIDINE GLUCONATE CLOTH 2 % EX PADS: 6.0000 | MEDICATED_PAD | Freq: Every day | CUTANEOUS | Status: DC

## 2024-02-18 MED ORDER — SODIUM CHLORIDE 0.9 % IV SOLN
200.0000 mg | Freq: Once | INTRAVENOUS | Status: AC
  Administered 2024-02-18: 200 mg via INTRAVENOUS
  Filled 2024-02-18: qty 10

## 2024-02-18 MED ORDER — PENTAFLUOROPROP-TETRAFLUOROETH EX AERO: 1.0000 | INHALATION_SPRAY | CUTANEOUS | Status: DC | PRN

## 2024-02-18 MED ORDER — LIDOCAINE HCL (PF) 1 % IJ SOLN: 5.0000 mL | INTRAMUSCULAR | Status: DC | PRN

## 2024-02-18 NOTE — ED Triage Notes (Signed)
 Pt here for routine dialysis, no other needs at this time. No cp or sob

## 2024-02-18 NOTE — Progress Notes (Addendum)
 Navigator met with pt on Monday, Oct 13 while pt receiving HD. Pt advised navigator that he is going to request that primary provider make referral for out-pt HD clinic. Navigator explained to pt that navigator is willing and able to make referral for pt as has been done in the past. Discussed with pt it might be good for pt to get documentation from community providers as to if they feel pt is appropriate for out-pt HD. If providers willing and able to provide such documentation, then navigator can submit referrals with documentation from pt's providers. Pt voiced understanding and to discuss with community providers. Navigator also contacted a local nephrologist to inquire if pt could ever be considered for PD. Will continue to follow and assist as able. At this time, pt does not have an accepting nephrologist or clinic for out-pt HD and comes to ED when HD treatments are needed.   Randine Mungo Dialysis Navigator [phone]  Addendum at 11:00 am: Pt would not be a candidate for PD per local nephrologist.

## 2024-02-18 NOTE — ED Provider Notes (Signed)
 [place] EMERGENCY DEPARTMENT AT [facility] Provider Note   CSN: [id number] Arrival date & time: 02/18/24  9384     Patient presents with: No chief complaint on file.   Paul [name] is a 58 y.o. male.   HPI Patient is here for his routine dialysis.  Last dialyzed 2 days ago in the ER.  Without complaints.   Past Medical History:  Diagnosis Date   Bipolar disorder (HCC)    Central retinal artery occlusion    with macular infarction of the right eye. status post posterior vitrectomy and photocoagulation with insertion of a shunt in 2006.   CHF (congestive heart failure) (HCC)    chronic mixed syst/diast. 02/2009 echo: Systolic function was mildly reduced. The estimated ejection fraction was in 45%, global HK, Grade I Diast dysfxn.   Chronic kidney disease    ESRD   Heart failure    Obesity    Osteoarthritis    s/p right hip replacement in 2001   Schizophrenia (HCC)    Severe uncontrolled hypertension     Prior to Admission medications   Medication Sig Start Date End Date Taking? Authorizing Provider  ARIPiprazole  (ABILIFY ) 5 MG tablet Take 1 tablet (5 mg total) by mouth at bedtime. 10/28/23   Plovsky, Elna, MD  carvedilol  (COREG ) 6.25 MG tablet Take 1 tablet (6.25 mg total) by mouth 2 (two) times daily with a meal. 01/13/24   Delbert Clam, MD  isosorbide  mononitrate (IMDUR ) 30 MG 24 hr tablet Take 1 tablet (30 mg total) by mouth daily. 10/25/23   [name], MD  OVER THE COUNTER MEDICATION Take 1 capsule by mouth daily. Lion's Mane for memory    [provider]  rosuvastatin  (CRESTOR ) 5 MG tablet Take 1 tablet (5 mg total) by mouth daily. 03/24/23     sucroferric oxyhydroxide (VELPHORO ) 500 MG chewable tablet Chew 3 tablets (1,500 mg total) by mouth 3 (three) times daily with meals. Patient not taking: Reported on 02/17/2024 07/29/23   Tawkaliyar, Roya, DO    Allergies: Penicillin g    Review of Systems  Updated Vital Signs BP 130/84    Pulse 69   Temp (!) 97.5 F (36.4 C)   Resp 15   Wt 82.3 kg   SpO2 98%   BMI 32.14 kg/m   Physical Exam Vitals and nursing note reviewed.  Cardiovascular:     Rate and Rhythm: Normal rate.  Pulmonary:     Effort: Pulmonary effort is normal.  Neurological:     Mental Status: He is alert. Mental status is at baseline.     (all labs ordered are listed, but only abnormal results are displayed) Labs Reviewed  HEPATITIS B SURFACE ANTIGEN  GLUCOSE, CAPILLARY  HEPATITIS B SURFACE ANTIBODY, QUANTITATIVE    EKG: None  Radiology: No results found.   Procedures   Medications Ordered in the ED  Chlorhexidine  Gluconate Cloth 2 % PADS 6 each (0 each Topical Hold 02/18/24 0821)  heparin  injection 2,400 Units (2,400 Units Dialysis Given 02/18/24 1015)  iron  sucrose (VENOFER ) 200 mg in sodium chloride  0.9 % 100 mL IVPB (0 mg Intravenous Stopped 02/18/24 1440)                                    Medical Decision Making  Patient here for his routine dialysis.  Without complaints.  Will discuss with nephrology.  Discussed with Dr. Vassie who  will see patient.     Final diagnoses:  End stage renal disease on dialysis [facility])    ED Discharge Orders     None          Patsey Lot, MD 02/18/24 1517

## 2024-02-18 NOTE — Progress Notes (Signed)
   02/18/24 1327  Vitals  Temp (!) 97.5 F (36.4 C)  Pulse Rate 69  Resp 15  BP 130/84  SpO2 98 %  O2 Device Room Air  Weight 82.3 kg  Type of Weight Post-Dialysis  Oxygen Therapy  Patient Activity (if Appropriate) In bed  Pulse Oximetry Type Continuous  Oximetry Probe Site Changed No  Post Treatment  Dialyzer Clearance Lightly streaked  Hemodialysis Intake (mL) 200 mL  Liters Processed 70.9  Fluid Removed (mL) 2100 mL  Tolerated HD Treatment Yes  Post-Hemodialysis Comments pt had a hypotensive episode  x1---tx ended approx 15 minutes early---nephrology was notified  AVG/AVF Arterial Site Held (minutes) 10 minutes  AVG/AVF Venous Site Held (minutes) 10 minutes   Received patient in stretcher from ED Alert and oriented.  Informed consent signed and in chart.   TX duration:3.0---came off 15 minutes early  Patient tolerated well.  Transported back to ED Alert, without acute distress.  Hand-off given to patient's nurse.   Access used: RUAF Access issues: no complications  Total UF removed: 2100 Medication(s) given: venofer  200mg  iv x1 Notified Dr. Geralynn of a hypotensive episode x 1 with tx---to order for 1500cc of uf removal next time rather than 2000---pt is asymptomatic [facility]

## 2024-02-18 NOTE — ED Notes (Signed)
 Pt returned from dialysis, left prior to EDP reevaluation.

## 2024-02-18 NOTE — ED Notes (Signed)
Pt transported to HD.

## 2024-02-18 NOTE — ED Notes (Signed)
 Patient refuses lab draw in ED. MD [place] notified.

## 2024-02-19 ENCOUNTER — Encounter: Payer: Self-pay | Admitting: Family Medicine

## 2024-02-19 LAB — HEPATITIS B SURFACE ANTIBODY, QUANTITATIVE: Hep B S AB Quant (Post): 6018 m[IU]/mL

## 2024-02-20 ENCOUNTER — Emergency Department (HOSPITAL_COMMUNITY)
Admission: EM | Admit: 2024-02-20 | Discharge: 2024-02-20 | Disposition: A | Discharge status: Home / Self Care | Attending: Emergency Medicine | Admitting: Emergency Medicine

## 2024-02-20 ENCOUNTER — Other Ambulatory Visit: Payer: Self-pay

## 2024-02-20 DIAGNOSIS — N186 End stage renal disease: Secondary | ICD-10-CM | POA: Diagnosis not present

## 2024-02-20 DIAGNOSIS — R1110 Vomiting, unspecified: Secondary | ICD-10-CM | POA: Diagnosis present

## 2024-02-20 DIAGNOSIS — I120 Hypertensive chronic kidney disease with stage 5 chronic kidney disease or end stage renal disease: Secondary | ICD-10-CM | POA: Diagnosis not present

## 2024-02-20 DIAGNOSIS — Z992 Dependence on renal dialysis: Secondary | ICD-10-CM | POA: Diagnosis not present

## 2024-02-20 LAB — RENAL FUNCTION PANEL
Albumin: 3.6 g/dL (ref 3.5–5.0)
Anion gap: 20 — ABNORMAL HIGH (ref 5–15)
BUN: 61 mg/dL — ABNORMAL HIGH (ref 6–20)
CO2: 21 mmol/L — ABNORMAL LOW (ref 22–32)
Calcium: 7.2 mg/dL — ABNORMAL LOW (ref 8.9–10.3)
Chloride: 94 mmol/L — ABNORMAL LOW (ref 98–111)
Creatinine, Ser: 11.92 mg/dL — ABNORMAL HIGH (ref 0.61–1.24)
GFR, Estimated: 4 mL/min — ABNORMAL LOW (ref >60)
Glucose, Bld: 90 mg/dL (ref 70–99)
Phosphorus: 9.6 mg/dL — ABNORMAL HIGH (ref 2.5–4.6)
Potassium: 4.7 mmol/L (ref 3.5–5.1)
Sodium: 135 mmol/L (ref 135–145)

## 2024-02-20 LAB — CBC
HCT: 30.9 % — ABNORMAL LOW (ref 39.0–52.0)
Hemoglobin: 10.2 g/dL — ABNORMAL LOW (ref 13.0–17.0)
MCH: 29.9 pg (ref 26.0–34.0)
MCHC: 33 g/dL (ref 30.0–36.0)
MCV: 90.6 fL (ref 80.0–100.0)
Platelets: 272 K/uL (ref 150–400)
RBC: 3.41 MIL/uL — ABNORMAL LOW (ref 4.22–5.81)
RDW: 17.6 % — ABNORMAL HIGH (ref 11.5–15.5)
WBC: 7.7 K/uL (ref 4.0–10.5)
nRBC: 0 % (ref 0.0–0.2)

## 2024-02-20 MED ORDER — LIDOCAINE HCL (PF) 1 % IJ SOLN: 5.0000 mL | INTRAMUSCULAR | Status: DC | PRN

## 2024-02-20 MED ORDER — NEPRO/CARBSTEADY PO LIQD: 237.0000 mL | ORAL | Status: DC | PRN

## 2024-02-20 MED ORDER — ANTICOAGULANT SODIUM CITRATE 4% (200MG/5ML) IV SOLN: 5.0000 mL | Status: DC | PRN

## 2024-02-20 MED ORDER — LIDOCAINE-PRILOCAINE 2.5-2.5 % EX CREA: 1.0000 | TOPICAL_CREAM | CUTANEOUS | Status: DC | PRN

## 2024-02-20 MED ORDER — HEPARIN SODIUM (PORCINE) 1000 UNIT/ML DIALYSIS: 1000.0000 [IU] | INTRAMUSCULAR | Status: DC | PRN

## 2024-02-20 MED ORDER — HEPARIN SODIUM (PORCINE) 1000 UNIT/ML DIALYSIS
2400.0000 [IU] | Freq: Once | INTRAMUSCULAR | Status: AC
  Administered 2024-02-20: 2400 [IU] via INTRAVENOUS_CENTRAL
  Filled 2024-02-20: qty 3

## 2024-02-20 MED ORDER — HEPARIN SODIUM (PORCINE) 1000 UNIT/ML IJ SOLN
INTRAMUSCULAR | Status: AC
  Filled 2024-02-20: qty 3

## 2024-02-20 MED ORDER — ALTEPLASE 2 MG IJ SOLR: 2.0000 mg | Freq: Once | INTRAMUSCULAR | Status: DC | PRN

## 2024-02-20 MED ORDER — CHLORHEXIDINE GLUCONATE CLOTH 2 % EX PADS: 6.0000 | MEDICATED_PAD | Freq: Every day | CUTANEOUS | Status: DC

## 2024-02-20 NOTE — ED Triage Notes (Signed)
Patient is here for his routine hemodialysis treatment .

## 2024-02-20 NOTE — Progress Notes (Signed)
 Received patient in bed to unit.  Alert and oriented.  Informed consent signed and in chart.   TX duration:3.5  Patient tolerated well.  Transported back to the room  Alert, without acute distress.  Hand-off given to patient's nurse.   Access used: Right AV Fistula upper arm Access issues: none  Total UF removed: 1.5L Medication(s) given: none   02/20/24 1257  Vitals  Temp (!) 97.5 F (36.4 C)  Temp Source Oral  BP 113/67  MAP (mmHg) 80  Pulse Rate 90  ECG Heart Rate 93  Resp 16  Weight 82.5 kg  Type of Weight Post-Dialysis  Oxygen Therapy  SpO2 100 %  O2 Device Room Air  During Treatment Monitoring  Duration of HD Treatment -hour(s) 3.5 hour(s)  HD Safety Checks Performed Yes  Intra-Hemodialysis Comments Tx completed  Post Treatment  Dialyzer Clearance Lightly streaked  Liters Processed 84  Fluid Removed (mL) 1500 mL  Tolerated HD Treatment Yes  Post-Hemodialysis Comments Tolerated well  AVG/AVF Arterial Site Held (minutes) 7 minutes  AVG/AVF Venous Site Held (minutes) 7 minutes  Fistula / Graft Right Upper arm Arteriovenous fistula  No placement date or time found.   Placed prior to admission: Yes  Orientation: Right  Access Location: Upper arm  Access Type: Arteriovenous fistula  Site Condition No complications  Fistula / Graft Assessment Present;Thrill;Bruit  Status Deaccessed;Patent  Drainage Description None     Camellia Brasil LPN Kidney Dialysis Unit

## 2024-02-20 NOTE — ED Provider Notes (Signed)
  [place] EMERGENCY DEPARTMENT AT [facility] Provider Note   CSN: [id number] Arrival date & time: 02/20/24  9344     Patient presents with: Hemodialysis   Paul [name] is a 58 y.o. male.   58 year old male here today for dialysis.  He last had dialysis 2 days ago.  He states that he had 1 episode of emesis yesterday.  He does not feel nauseated or have any pain in his abdomen now.  Stated that he just wanted to let me know.        Prior to Admission medications   Medication Sig Start Date End Date Taking? Authorizing Provider  ARIPiprazole  (ABILIFY ) 5 MG tablet Take 1 tablet (5 mg total) by mouth at bedtime. 10/28/23   Plovsky, Elna, MD  carvedilol  (COREG ) 6.25 MG tablet Take 1 tablet (6.25 mg total) by mouth 2 (two) times daily with a meal. 01/13/24   Delbert Clam, MD  isosorbide  mononitrate (IMDUR ) 30 MG 24 hr tablet Take 1 tablet (30 mg total) by mouth daily. 10/25/23   [name], MD  OVER THE COUNTER MEDICATION Take 1 capsule by mouth daily. Lion's Mane for memory    [provider]  rosuvastatin  (CRESTOR ) 5 MG tablet Take 1 tablet (5 mg total) by mouth daily. 03/24/23     sucroferric oxyhydroxide (VELPHORO ) 500 MG chewable tablet Chew 3 tablets (1,500 mg total) by mouth 3 (three) times daily with meals. Patient not taking: Reported on 02/17/2024 07/29/23   Tawkaliyar, Roya, DO    Allergies: Penicillin g    Review of Systems  Updated Vital Signs BP (!) 171/95 (BP Location: Left Arm)   Pulse 84   Temp 97.6 F (36.4 C)   Resp 15   SpO2 100%   Physical Exam Vitals and nursing note reviewed.  HENT:     Head: Normocephalic.  Abdominal:     General: Abdomen is flat.     Palpations: Abdomen is soft.     Tenderness: There is no abdominal tenderness.  Neurological:     Mental Status: He is alert.     (all labs ordered are listed, but only abnormal results are displayed) Labs Reviewed - No data to  display  EKG: None  Radiology: No results found.   Procedures   Medications Ordered in the ED  Chlorhexidine  Gluconate Cloth 2 % PADS 6 each (has no administration in time range)                                    Medical Decision Making 58 year old male here today for his scheduled dialysis.  Plan-patient with a single episode of emesis.  No complaints now.  Is appropriate for hemodialysis.  Reached out to Dr. Geralynn who is placing dialysis orders for the patient.  Barring an event at dialysis, patient is appropriate to be discharged when he returns to the emergency room.        Final diagnoses:  Dialysis patient    ED Discharge Orders     None          Mannie Fairy DASEN, DO 02/20/24 [phone]

## 2024-02-23 ENCOUNTER — Other Ambulatory Visit: Payer: Self-pay

## 2024-02-23 ENCOUNTER — Emergency Department (HOSPITAL_COMMUNITY)
Admission: EM | Admit: 2024-02-23 | Discharge: 2024-02-23 | Disposition: A | Discharge status: Home / Self Care | Attending: Emergency Medicine | Admitting: Emergency Medicine

## 2024-02-23 DIAGNOSIS — N186 End stage renal disease: Secondary | ICD-10-CM | POA: Diagnosis not present

## 2024-02-23 DIAGNOSIS — Z79899 Other long term (current) drug therapy: Secondary | ICD-10-CM | POA: Insufficient documentation

## 2024-02-23 DIAGNOSIS — I509 Heart failure, unspecified: Secondary | ICD-10-CM | POA: Diagnosis not present

## 2024-02-23 DIAGNOSIS — I132 Hypertensive heart and chronic kidney disease with heart failure and with stage 5 chronic kidney disease, or end stage renal disease: Secondary | ICD-10-CM | POA: Diagnosis not present

## 2024-02-23 DIAGNOSIS — Z992 Dependence on renal dialysis: Secondary | ICD-10-CM | POA: Insufficient documentation

## 2024-02-23 DIAGNOSIS — I120 Hypertensive chronic kidney disease with stage 5 chronic kidney disease or end stage renal disease: Secondary | ICD-10-CM | POA: Diagnosis not present

## 2024-02-23 LAB — RENAL FUNCTION PANEL
Albumin: 3.3 g/dL — ABNORMAL LOW (ref 3.5–5.0)
Anion gap: 23 — ABNORMAL HIGH (ref 5–15)
BUN: 99 mg/dL — ABNORMAL HIGH (ref 6–20)
CO2: 20 mmol/L — ABNORMAL LOW (ref 22–32)
Calcium: 6.9 mg/dL — ABNORMAL LOW (ref 8.9–10.3)
Chloride: 93 mmol/L — ABNORMAL LOW (ref 98–111)
Creatinine, Ser: 13.05 mg/dL — ABNORMAL HIGH (ref 0.61–1.24)
GFR, Estimated: 4 mL/min — ABNORMAL LOW (ref >60)
Glucose, Bld: 81 mg/dL (ref 70–99)
Phosphorus: 11.3 mg/dL — ABNORMAL HIGH (ref 2.5–4.6)
Potassium: 4.5 mmol/L (ref 3.5–5.1)
Sodium: 136 mmol/L (ref 135–145)

## 2024-02-23 LAB — CBC
HCT: 27.8 % — ABNORMAL LOW (ref 39.0–52.0)
Hemoglobin: 9.6 g/dL — ABNORMAL LOW (ref 13.0–17.0)
MCH: 30.8 pg (ref 26.0–34.0)
MCHC: 34.5 g/dL (ref 30.0–36.0)
MCV: 89.1 fL (ref 80.0–100.0)
Platelets: 307 K/uL (ref 150–400)
RBC: 3.12 MIL/uL — ABNORMAL LOW (ref 4.22–5.81)
RDW: 17.8 % — ABNORMAL HIGH (ref 11.5–15.5)
WBC: 7.1 K/uL (ref 4.0–10.5)
nRBC: 0 % (ref 0.0–0.2)

## 2024-02-23 MED ORDER — PENTAFLUOROPROP-TETRAFLUOROETH EX AERO: 1.0000 | INHALATION_SPRAY | CUTANEOUS | Status: DC | PRN

## 2024-02-23 MED ORDER — LIDOCAINE-PRILOCAINE 2.5-2.5 % EX CREA: 1.0000 | TOPICAL_CREAM | CUTANEOUS | Status: DC | PRN

## 2024-02-23 MED ORDER — HEPARIN SODIUM (PORCINE) 1000 UNIT/ML DIALYSIS: 40.0000 [IU]/kg | INTRAMUSCULAR | Status: DC | PRN

## 2024-02-23 MED ORDER — CHLORHEXIDINE GLUCONATE CLOTH 2 % EX PADS: 6.0000 | MEDICATED_PAD | Freq: Every day | CUTANEOUS | Status: DC

## 2024-02-23 MED ORDER — LIDOCAINE HCL (PF) 1 % IJ SOLN: 5.0000 mL | INTRAMUSCULAR | Status: DC | PRN

## 2024-02-23 NOTE — ED Notes (Signed)
Patient updated on plan of care. Patient denies needs at this time.

## 2024-02-23 NOTE — Progress Notes (Signed)
 Received patient in bed to unit.  Alert and oriented.  Informed consent signed and in chart.   TX duration:3.5  Patient tolerated well.  Transported back to the room  Alert, without acute distress.  Hand-off given to patient's nurse.   Access used: left upper arm fistula Access issues: none  Total UF removed: 2L   02/23/24 1609  Vitals  Temp 97.7 F (36.5 C)  BP 129/83  Pulse Rate 78  Resp 16  MEWS COLOR  MEWS Score Color Green  Oxygen Therapy  SpO2 97 %  O2 Device Room Air  Patient Activity (if Appropriate) In bed  Pulse Oximetry Type Continuous  Oximetry Probe Site Changed No  Height and Weight  Weight 83.8 kg  Type of Weight Post-Dialysis  BMI (Calculated) 32.73  MEWS Score  MEWS Temp 0  MEWS Systolic 0  MEWS Pulse 0  MEWS RR 0  MEWS LOC 0  MEWS Score 0     Camellia Brasil LPN [facility]

## 2024-02-23 NOTE — ED Provider Notes (Signed)
 [place] EMERGENCY DEPARTMENT AT [facility] Provider Note   CSN: [id number] Arrival date & time: 02/23/24  9347     Patient presents with: Hemodialysis   Paul [name] is a 58 y.o. male.   Pt is a 58 yo male with pmhx significant for ESRD on HD, HTN, CRAO, to right eye (blind) OA, CHF, and schizophrenia.  Pt has been coming to the ED for HD as he was kicked out of his last dialysis center.  Nephrology has been trying to find him another outpatient center, but has not been able to so far.  Pt last had dialysis on Sat, 10/11.  Pt denies sob or cp.       Prior to Admission medications   Medication Sig Start Date End Date Taking? Authorizing Provider  ARIPiprazole  (ABILIFY ) 5 MG tablet Take 1 tablet (5 mg total) by mouth at bedtime. 10/28/23   Plovsky, Elna, MD  carvedilol  (COREG ) 6.25 MG tablet Take 1 tablet (6.25 mg total) by mouth 2 (two) times daily with a meal. 01/13/24   Delbert Clam, MD  isosorbide  mononitrate (IMDUR ) 30 MG 24 hr tablet Take 1 tablet (30 mg total) by mouth daily. 10/25/23   [name], MD  OVER THE COUNTER MEDICATION Take 1 capsule by mouth daily. Lion's Mane for memory    [provider]  rosuvastatin  (CRESTOR ) 5 MG tablet Take 1 tablet (5 mg total) by mouth daily. 03/24/23     sucroferric oxyhydroxide (VELPHORO ) 500 MG chewable tablet Chew 3 tablets (1,500 mg total) by mouth 3 (three) times daily with meals. Patient not taking: Reported on 02/17/2024 07/29/23   Tawkaliyar, Roya, DO    Allergies: Penicillin g    Review of Systems  All other systems reviewed and are negative.   Updated Vital Signs BP (!) 156/103 (BP Location: Left Arm)   Pulse 85   Temp 97.7 F (36.5 C)   Resp 18   SpO2 94%   Physical Exam Vitals and nursing note reviewed.  Constitutional:      Appearance: Normal appearance.  HENT:     Head: Normocephalic and atraumatic.     Right Ear: External ear normal.     Left Ear: External ear normal.      Mouth/Throat:     Mouth: Mucous membranes are dry.     Pharynx: Oropharynx is clear.  Eyes:     Comments: Right eye blind  Cardiovascular:     Rate and Rhythm: Normal rate and regular rhythm.     Pulses: Normal pulses.     Heart sounds: Normal heart sounds.  Pulmonary:     Effort: Pulmonary effort is normal.     Breath sounds: Normal breath sounds.  Abdominal:     General: Abdomen is flat. Bowel sounds are normal.     Palpations: Abdomen is soft.  Musculoskeletal:        General: Normal range of motion.     Cervical back: Normal range of motion and neck supple.  Skin:    General: Skin is warm.     Capillary Refill: Capillary refill takes less than 2 seconds.  Neurological:     General: No focal deficit present.     Mental Status: He is alert and oriented to person, place, and time.  Psychiatric:        Mood and Affect: Mood normal.        Behavior: Behavior normal.     (all labs ordered are listed, but only  abnormal results are displayed) Labs Reviewed - No data to display  EKG: None  Radiology: No results found.   Procedures   Medications Ordered in the ED  Chlorhexidine  Gluconate Cloth 2 % PADS 6 each (has no administration in time range)                                    Medical Decision Making  This patient presents to the ED for concern of needing HD, this involves an extensive number of treatment options, and is a complaint that carries with it a high risk of complications and morbidity.  The differential diagnosis includes ESRD on HD   Co morbidities that complicate the patient evaluation  ESRD on HD, HTN, CRAO, to right eye (blind) OA, CHF, and schizophrenia   Additional history obtained:  Additional history obtained from epic chart review   Medicines ordered and prescription drug management:   I have reviewed the patients home medicines and have made adjustments as needed   Consultations Obtained:  I requested consultation with the  nephrologist (Dr. DOROTHA Blanch),  and discussed lab and imaging findings as well as pertinent plan -he will order HD.   Problem List / ED Course:  ESRD on HD:  pt will go to HD when available.  Plan for likely d/c after dialysis.   Reevaluation:  After the interventions noted above, I reevaluated the patient and found that they have :improved   Social Determinants of Health:  Lives at home   Dispostion:  Likely d/c after dialysis.     Final diagnoses:  ESRD on hemodialysis [facility])    ED Discharge Orders     None          Dean Clarity, MD 02/23/24 [phone]

## 2024-02-23 NOTE — Progress Notes (Signed)
 Patient ID: Paul [name], male   DOB: 09/07/1965, 58 y.o.   MRN: [id number] Consulted by Dr. Dean to provide dialysis for this patient who does not have a regular/scheduled outpatient dialysis unit after discharge for behavioral issues. Patient clinical status discussed with Dr. Dean and labs reviewed. Will order for hemodialysis with the plan to send him back to the emergency room for reevaluation thereafter and possible discharge.  Gordy Blanch MD [facility]. Office # [phone] Pager # [phone] 8:06 AM

## 2024-02-23 NOTE — Progress Notes (Signed)
   02/23/24 1609  Vitals  Temp 97.7 F (36.5 C)  BP 129/83  Pulse Rate 78  Resp 16  Weight 83.8 kg  Type of Weight Post-Dialysis  Oxygen Therapy  SpO2 97 %  O2 Device Room Air  Patient Activity (if Appropriate) In bed  Pulse Oximetry Type Continuous  Oximetry Probe Site Changed No  During Treatment Monitoring  Duration of HD Treatment -hour(s) 3.5 hour(s)  HD Safety Checks Performed Yes  Intra-Hemodialysis Comments Tx completed;Tolerated well  Post Treatment  Dialyzer Clearance Clear  Liters Processed 84  Fluid Removed (mL) 2000 mL  Tolerated HD Treatment Yes  AVG/AVF Arterial Site Held (minutes) 7 minutes  AVG/AVF Venous Site Held (minutes) 7 minutes  Fistula / Graft Right Upper arm Arteriovenous fistula  No placement date or time found.   Placed prior to admission: Yes  Orientation: Right  Access Location: Upper arm  Access Type: Arteriovenous fistula  Site Condition No complications  Fistula / Graft Assessment Present;Thrill;Bruit  Status Deaccessed;Patent  Drainage Description None

## 2024-02-23 NOTE — ED Notes (Signed)
 Camellia Brasil called RN to report time to collect patient. Camellia reported that he would collect the ordered lab work.

## 2024-02-23 NOTE — ED Notes (Signed)
 Patient rounded on. Patient offered blanket. Patient denies. Patient denies questions, concerns or needs at this time.

## 2024-02-23 NOTE — ED Triage Notes (Signed)
 Patient requesting routine hemodialysis treatment .

## 2024-02-24 ENCOUNTER — Ambulatory Visit (HOSPITAL_COMMUNITY): Admitting: Psychiatry

## 2024-02-24 ENCOUNTER — Other Ambulatory Visit (HOSPITAL_COMMUNITY): Payer: Self-pay

## 2024-02-24 ENCOUNTER — Other Ambulatory Visit: Payer: Self-pay

## 2024-02-24 ENCOUNTER — Ambulatory Visit (HOSPITAL_BASED_OUTPATIENT_CLINIC_OR_DEPARTMENT_OTHER)

## 2024-02-24 ENCOUNTER — Encounter (HOSPITAL_COMMUNITY): Payer: Self-pay | Admitting: Psychiatry

## 2024-02-24 VITALS — BP 172/101 | HR 78 | Ht 63.0 in | Wt 184.0 lb

## 2024-02-24 DIAGNOSIS — F250 Schizoaffective disorder, bipolar type: Secondary | ICD-10-CM | POA: Diagnosis not present

## 2024-02-24 DIAGNOSIS — F22 Delusional disorders: Secondary | ICD-10-CM | POA: Diagnosis not present

## 2024-02-24 MED ORDER — ARIPIPRAZOLE ER 400 MG IM PRSY
400.0000 mg | PREFILLED_SYRINGE | INTRAMUSCULAR | Status: AC
  Administered 2024-02-24 – 2024-05-25 (×5): 400 mg via INTRAMUSCULAR

## 2024-02-24 MED ORDER — ARIPIPRAZOLE ER 400 MG IM PRSY: 400.0000 mg | PREFILLED_SYRINGE | Freq: Once | INTRAMUSCULAR | Status: DC

## 2024-02-24 MED ORDER — ARIPIPRAZOLE ER 400 MG IM PRSY
400.0000 mg | PREFILLED_SYRINGE | INTRAMUSCULAR | 11 refills | Status: DC
  Filled 2024-02-24 – 2024-02-25 (×2): qty 1, 28d supply, fill #0
  Filled 2024-03-17: qty 1, 28d supply, fill #1

## 2024-02-24 NOTE — Progress Notes (Signed)
 Pharmacy Patient Advocate Encounter  Insurance verification completed.   The patient is insured through [employer] test claim for Abilify  Maintena. Co-pay is $0.  This test claim was processed through [facility]- copay amounts may vary at other pharmacies due to pharmacy/plan contracts, or as the patient moves through the different stages of their insurance plan.

## 2024-02-24 NOTE — Progress Notes (Signed)
 Psychiatric Initial Adult Assessment   Patient Identification: Paul [name] MRN:  [id number] Date of Evaluation:  02/24/2024 Referral Source: Barnie Louder Chief Complaint: I cannot think straight No chief complaint on file.  Visit Diagnosis: Schizoaffective disorder  History of Present Illness:     Today the patient is doing quite well.  He seems to be calm.  He is coming with a typed letter that he typed where he is asking to be referred to a local dialysis clinic.  Presently he is getting dialysis at [facility] and it is very time-consuming.  Because it takes him much time he does not have the opportunity to apply for a job.  The patient openly acknowledges that he has made some mistakes.  He is talk to people with a negative way.  Presently he is even enjoying a men's group at [employer] to deal with anger and other medical issues.  He is doing everything he can to try to get more calm and more appropriate.  I do not believe he has any homicidal thinking at this time.  I think he acknowledges that he is made some mistakes in his thinking towards women in general.  He says to me that he would like to have a relationship and get back with some woman at some point.  Today we talked to him about the importance of taking his medicines.  He is somewhat erratic about taking his Abilify  and today we offered him Abilify  sustain a as an injection and he agreed to take it.  The patient generally is sleeping and eating well.  He likes where he lives.  He believes the people around him are really good people.  [facility] and he will return to see me in 1 month.  At that time I will be more than happy to write a letter of support to try to get him back into a dialysis clinic so his life will be better in about more time to do more productive things like getting back into the workforce. Depression Symptoms:  depressed mood, (Hypo) Manic Symptoms:    Anxiety Symptoms:   Psychotic Symptoms:   PTSD Symptoms: Negative Past Psychiatric History: 3 psychiatric hospitalizations  Previous Psychotropic Medications: Yes   Substance Abuse History in the last 12 months:  Yes.    Consequences of Substance Abuse: NA  Past Medical History:  Past Medical History:  Diagnosis Date   Bipolar disorder (HCC)    Central retinal artery occlusion    with macular infarction of the right eye. status post posterior vitrectomy and photocoagulation with insertion of a shunt in 2006.   CHF (congestive heart failure) (HCC)    chronic mixed syst/diast. 02/2009 echo: Systolic function was mildly reduced. The estimated ejection fraction was in 45%, global HK, Grade I Diast dysfxn.   Chronic kidney disease    ESRD   Heart failure    Obesity    Osteoarthritis    s/p right hip replacement in 2001   Schizophrenia (HCC)    Severe uncontrolled hypertension     Past Surgical History:  Procedure Laterality Date   A/V FISTULAGRAM Right 02/05/2024   Procedure: A/V Fistulagram;  Surgeon: Serene Gaile ORN, MD;  Location: [facility];  Service: Cardiovascular;  Laterality: Right;   A/V SHUNT INTERVENTION N/A 10/08/2023   Procedure: A/V SHUNT INTERVENTION;  Surgeon: Sheree Penne Bruckner, MD;  Location: [facility];  Service: Cardiovascular;  Laterality:  N/A;   AV FISTULA PLACEMENT Right 07/08/2022   Procedure: RIGHT RADIOCEPHALIC ARTERIOVENOUS (AV) FISTULA CREATION;  Surgeon: Lanis Fonda BRAVO, MD;  Location: [facility] OR;  Service: Vascular;  Laterality: Right;   IR FLUORO GUIDE CV LINE RIGHT  05/13/2022   IR US  GUIDE VASC ACCESS RIGHT  05/13/2022   JOINT REPLACEMENT     right hip replacement   right eye surgery     for glaucoma   TOTAL HIP ARTHROPLASTY Bilateral 1997, 2001   UPPER EXTREMITY INTERVENTION  10/08/2023   Procedure: UPPER EXTREMITY INTERVENTION;  Surgeon: Sheree Penne Bruckner, MD;  Location: [facility];  Service: Cardiovascular;;  Stent   VENOUS ANGIOPLASTY   10/08/2023   Procedure: VENOUS ANGIOPLASTY;  Surgeon: Sheree Penne Bruckner, MD;  Location: [facility];  Service: Cardiovascular;;   VENOUS ANGIOPLASTY Right 02/05/2024   Procedure: VENOUS ANGIOPLASTY;  Surgeon: Serene Gaile ORN, MD;  Location: [facility];  Service: Cardiovascular;  Laterality: Right;  Cephalic Vein; Cephalic Arch    Family Psychiatric History:   Family History:  Family History  Problem Relation Age of Onset   Hypertension Father    Stroke Father     Social History:   Social History   Socioeconomic History   Marital status: Legally Separated    Spouse name: Not on file   Number of children: Not on file   Years of education: Not on file   Highest education level: Not on file  Occupational History   Occupation: Chartered certified accountant  Tobacco Use   Smoking status: Former   Smokeless tobacco: Never   Tobacco comments:    Quit smoking 20-30 years ago, smoked for 1 year.  Vaping Use   Vaping status: Never Used  Substance and Sexual Activity   Alcohol use: No   Drug use: No   Sexual activity: Yes    Birth control/protection: None  Other Topics Concern   Not on file  Social History Narrative   Not on file   Social Drivers of Health   Financial Resource Strain: High Risk (09/15/2023)   Overall Financial Resource Strain (CARDIA)    Difficulty of Paying Living Expenses: Very hard  Food Insecurity: No Food Insecurity (09/15/2023)   Hunger Vital Sign    Worried About Running Out of Food in the Last Year: Never true    Ran Out of Food in the Last Year: Never true  Transportation Needs: Unmet Transportation Needs (09/15/2023)   PRAPARE - Transportation    Lack of Transportation (Medical): Yes    Lack of Transportation (Non-Medical): Yes  Physical Activity: Not on file  Stress: No Stress Concern Present (09/15/2023)   [employer] of Occupational Health - Occupational Stress Questionnaire    Feeling of Stress : Only a little  Social Connections: Moderately  Integrated (07/01/2023)   Social Connection and Isolation Panel    Frequency of Communication with Friends and Family: Three times a week    Frequency of Social Gatherings with Friends and Family: Never    Attends Religious Services: More than 4 times per year    Active Member of [employer] or Organizations: Yes    Attends Engineer, structural: More than 4 times per year    Marital Status: Divorced    Additional Social History:   Allergies:   Allergies  Allergen Reactions   Penicillin G Nausea And Vomiting    Metabolic Disorder Labs: Lab Results  Component Value Date   HGBA1C 5.5 06/04/2021   MPG 111.15 06/04/2021  No results found for: PROLACTIN Lab Results  Component Value Date   CHOL 163 06/05/2021   TRIG 230 (H) 06/05/2021   HDL 37 (L) 06/05/2021   CHOLHDL 4.4 06/05/2021   VLDL 46 (H) 06/05/2021   LDLCALC 80 06/05/2021   LDLCALC  12/16/2008    86        Total Cholesterol/HDL:CHD Risk Coronary Heart Disease Risk Table                     Men   Women  1/2 Average Risk   3.4   3.3  Average Risk       5.0   4.4  2 X Average Risk   9.6   7.1  3 X Average Risk  23.4   11.0        Use the calculated Patient Ratio above and the CHD Risk Table to determine the patient's CHD Risk.        ATP III CLASSIFICATION (LDL):  <100     mg/dL   Optimal  899-870  mg/dL   Near or Above                    Optimal  130-159  mg/dL   Borderline  839-810  mg/dL   High  >809     mg/dL   Very High     Therapeutic Level Labs: No results found for: LITHIUM No results found for: CBMZ No results found for: VALPROATE  Current Medications: Current Outpatient Medications  Medication Sig Dispense Refill   ARIPiprazole  (ABILIFY ) 5 MG tablet Take 1 tablet (5 mg total) by mouth at bedtime. 30 tablet 3   carvedilol  (COREG ) 6.25 MG tablet Take 1 tablet (6.25 mg total) by mouth 2 (two) times daily with a meal. 180 tablet 1   isosorbide  mononitrate (IMDUR ) 30 MG 24 hr tablet  Take 1 tablet (30 mg total) by mouth daily. 30 tablet 6   OVER THE COUNTER MEDICATION Take 1 capsule by mouth daily. Lion's Mane for memory     rosuvastatin  (CRESTOR ) 5 MG tablet Take 1 tablet (5 mg total) by mouth daily. 90 tablet 3   sucroferric oxyhydroxide (VELPHORO ) 500 MG chewable tablet Chew 3 tablets (1,500 mg total) by mouth 3 (three) times daily with meals. (Patient not taking: Reported on 02/24/2024) 90 tablet 0   No current facility-administered medications for this visit.    Musculoskeletal: Strength & Muscle Tone: abnormal Gait & Station: ataxic Patient leans: Right  Psychiatric Specialty Exam: Review of Systems  Blood pressure (!) 172/101, pulse 78, height 5' 3 (1.6 m), weight 184 lb (83.5 kg).Body mass index is 32.59 kg/m.  General Appearance: Casual  Eye Contact:  Fair  Speech:  Clear and Coherent  Volume:  Normal  Mood:  NA  Affect:  Appropriate  Thought Process:  Coherent  Orientation:  Full (Time, Place, and Person)  Thought Content:  Delusions  Suicidal Thoughts:  No  Homicidal Thoughts:  No  Memory:  Negative  Judgement:  Poor  Insight:  Lacking  Psychomotor Activity:  Normal  Concentration:    Recall:  Fair  Fund of Knowledge:Fair  Language: Good  Akathisia:  No  Handed:  Right  AIMS (if indicated  Assets:  Desire for Improvement  ADL's:  Intact  Cognition: WNL  Sleep:  Good   Screenings: CAGE-AID    Flowsheet Row ED to Hosp-Admission (Discharged) from 07/24/2023 in [place] [facility]  CAGE-AID Score  0   GAD-7    Flowsheet Row Office Visit from 09/15/2023 in [facility] [place] - A Dept Of [place]. [facility]  Total GAD-7 Score 1   PHQ2-9    Flowsheet Row Office Visit from 01/13/2024 in [facility] [place] - A Dept Of [place]. [facility] Office Visit from 09/15/2023 in [facility] - A Dept Of [place]. [facility] Office Visit from 06/07/2021 in [facility] - A Dept Of [place]. [facility] Office Visit from 11/03/2019 in [facility] - A Dept Of [place]. [facility]  PHQ-2 Total Score 2 0 0 0  PHQ-9 Total Score 7 -- -- 3   Flowsheet Row ED from 02/23/2024 in [facility] at [facility] ED from 02/20/2024 in [facility] ED from 02/18/2024 in [facility] Emergency Department at [facility]  C-SSRS RISK CATEGORY No Risk No Risk No Risk    Assessment and Plan:    Today the patient seems to be doing well.  He is rational and clear.  He has a reasonable plan to try to get into a local dialysis clinic.  I will write him a letter of support.  Today we will discontinue his oral Abilify  and will begin on 400 mg of Abilify  sustain.  He will return in 28 days get his second injection and at that time we will have a letter ready for him.  He will demonstrate to us  that he is coming back for the injections and we will reassess his thinking.  At this time he has little evidence of any psychotic symptomatology.  He will return to see me in 1 month.  Collaboration of Care:   Patient/Guardian was advised Release of Information must be obtained prior to any record release in order to collaborate their care with an outside provider. Patient/Guardian was advised if they have not already done so to contact the registration department to sign all necessary forms in order for us  to release information regarding their care.   Consent: Patient/Guardian gives verbal consent for treatment and assignment of benefits for services provided during this visit. Patient/Guardian expressed understanding and agreed to proceed.   Elna LILLETTE Lo, MD 10/21/20252:11 PM

## 2024-02-24 NOTE — Progress Notes (Cosign Needed)
 Patient arrived today for an appointment with Dr. Tasia. An injection of Abilify  Maintenna 400 mg was ordered. Patient had a pleasant affect and I answered all of his questions, patient agreed to the shot. Patient denies SI/HI or AVH. Injection was prepared as oreded and administered in patients Left Upper Outer Quadrant.    NDC: 40851-927-07 LOT: [id number] EXP: SEP 2027

## 2024-02-25 ENCOUNTER — Emergency Department (HOSPITAL_COMMUNITY)
Admission: EM | Admit: 2024-02-25 | Discharge: 2024-02-25 | Disposition: A | Discharge status: Home / Self Care | Attending: Emergency Medicine | Admitting: Emergency Medicine

## 2024-02-25 ENCOUNTER — Other Ambulatory Visit: Payer: Self-pay

## 2024-02-25 ENCOUNTER — Encounter (HOSPITAL_COMMUNITY): Payer: Self-pay

## 2024-02-25 DIAGNOSIS — I132 Hypertensive heart and chronic kidney disease with heart failure and with stage 5 chronic kidney disease, or end stage renal disease: Secondary | ICD-10-CM | POA: Diagnosis not present

## 2024-02-25 DIAGNOSIS — N186 End stage renal disease: Secondary | ICD-10-CM | POA: Insufficient documentation

## 2024-02-25 DIAGNOSIS — Z992 Dependence on renal dialysis: Secondary | ICD-10-CM | POA: Diagnosis not present

## 2024-02-25 DIAGNOSIS — I509 Heart failure, unspecified: Secondary | ICD-10-CM | POA: Diagnosis not present

## 2024-02-25 LAB — CBC WITH DIFFERENTIAL/PLATELET
Abs Immature Granulocytes: 0.05 K/uL (ref 0.00–0.07)
Basophils Absolute: 0.1 K/uL (ref 0.0–0.1)
Basophils Relative: 1 %
Eosinophils Absolute: 0.1 K/uL (ref 0.0–0.5)
Eosinophils Relative: 2 %
HCT: 28.9 % — ABNORMAL LOW (ref 39.0–52.0)
Hemoglobin: 9.7 g/dL — ABNORMAL LOW (ref 13.0–17.0)
Immature Granulocytes: 1 %
Lymphocytes Relative: 15 %
Lymphs Abs: 1.3 K/uL (ref 0.7–4.0)
MCH: 30.4 pg (ref 26.0–34.0)
MCHC: 33.6 g/dL (ref 30.0–36.0)
MCV: 90.6 fL (ref 80.0–100.0)
Monocytes Absolute: 0.9 K/uL (ref 0.1–1.0)
Monocytes Relative: 11 %
Neutro Abs: 5.9 K/uL (ref 1.7–7.7)
Neutrophils Relative %: 70 %
Platelets: 278 K/uL (ref 150–400)
RBC: 3.19 MIL/uL — ABNORMAL LOW (ref 4.22–5.81)
RDW: 18.3 % — ABNORMAL HIGH (ref 11.5–15.5)
WBC: 8.4 K/uL (ref 4.0–10.5)
nRBC: 0 % (ref 0.0–0.2)

## 2024-02-25 LAB — RENAL FUNCTION PANEL
Albumin: 3.5 g/dL (ref 3.5–5.0)
Anion gap: 24 — ABNORMAL HIGH (ref 5–15)
BUN: 84 mg/dL — ABNORMAL HIGH (ref 6–20)
CO2: 20 mmol/L — ABNORMAL LOW (ref 22–32)
Calcium: 7.2 mg/dL — ABNORMAL LOW (ref 8.9–10.3)
Chloride: 92 mmol/L — ABNORMAL LOW (ref 98–111)
Creatinine, Ser: 12.54 mg/dL — ABNORMAL HIGH (ref 0.61–1.24)
GFR, Estimated: 4 mL/min — ABNORMAL LOW (ref >60)
Glucose, Bld: 99 mg/dL (ref 70–99)
Phosphorus: >30 mg/dL — ABNORMAL HIGH (ref 2.5–4.6)
Potassium: 4.8 mmol/L (ref 3.5–5.1)
Sodium: 136 mmol/L (ref 135–145)

## 2024-02-25 MED ORDER — CHLORHEXIDINE GLUCONATE CLOTH 2 % EX PADS: 6.0000 | MEDICATED_PAD | Freq: Every day | CUTANEOUS | Status: DC

## 2024-02-25 NOTE — ED Notes (Signed)
 Pt transported to dialysis at this time.

## 2024-02-25 NOTE — ED Notes (Addendum)
 Upon return from dialysis, patient immediately leaves. Pt ambulatory in no acute distress. MD and charge aware. Received report from [place], dialysis RN. Per dialysis, pt tolerated 1.7L fluid off. Last vitals in dialysis: 107/78 bp, 64 hr, 14 rr, 97.7 temp.

## 2024-02-25 NOTE — ED Triage Notes (Signed)
 Pt reports he is here for dialysis

## 2024-02-25 NOTE — Progress Notes (Signed)
 Navigator received a call from pt this afternoon. Pt reports that he was seen by his psychiatrist yesterday and pt received an injection instead of pt taking oral psychiatric meds. Pt states he discussed with provider the possibility of provider providing a letter of recommendation/compliance that may aide in efforts for pt to be accepted at an out-pt HD unit. Provider advised pt he may consider providing such documentation in the future. Navigator also spoke to [employer] rep today. Navigator advised pt has not been d/c from [facility]. Pt would have to be accepted by a nephrologist (a non-[facility] provider) at a [facility] clinic [facility] would be the closest) in order for [facility] to be a possible options. Referral could be submitted for review to [facility] to see if nephrologist/clinic would be willing to accept pt for care. Navigator is also willing to contact [facility]) if an accepting nephrologist and clinic is located to see if pt can go to a new clinic under 2nd chance program if needed/appropriate. Navigator discussed the above info/situation with pt. Both pt and navigator feel is may be most beneficial to await possible letter from psychiatrist to aide in referral process. Pt states it would be possible to get his transportation to transport him to/from [place] area if a clinic there will accept him. Pt has been declined by [facility] and [facility] clinics in the past due to behavior hx. Will continue to speak with pt and assist as able.   Randine Mungo Dialysis Navigator [phone]

## 2024-02-25 NOTE — Progress Notes (Signed)
 Ultrafiltration 1.7 liters, condition stable , report given to primary RN, and patient transported to the hallway in the Emergency department HO- 11.

## 2024-02-25 NOTE — Progress Notes (Signed)
 Specialty Pharmacy Initial Fill Coordination Note  Paul [name] is a 58 y.o. male contacted today regarding initial fill of specialty medication(s) ARIPiprazole  (ABILIFY  MAINTENA)   Patient requested Courier to Provider Office   Delivery date: 02/26/24   Verified address: [facility]  [address] [place] [address]  [place],  [place]  [address]   Medication will be filled on 10/22.   Patient is aware of $0 copayment.

## 2024-02-25 NOTE — Progress Notes (Signed)
 Patient ID: Paul [name], male   DOB: 04-Aug-1965, 58 y.o.   MRN: [id number] Consulted by Dr. Bari to provide hemodialysis for Paul [name] who presented to the ER for this service.  He does not have a regular/scheduled outpatient hemodialysis unit after he was discharged for behavioral issues.  The patient's clinical status was discussed with Dr. Bari and labs from 02/23/2024 reviewed.  I will order for hemodialysis today with the plan to send him back to the emergency room for reevaluation thereafter and likely discharge.  Gordy Blanch MD [facility]. Office # [phone] Pager # [phone] 7:43 AM

## 2024-02-25 NOTE — ED Provider Notes (Signed)
 [place] EMERGENCY DEPARTMENT AT [facility] Provider Note   CSN: [id number] Arrival date & time: 02/25/24  9294     Patient presents with: No chief complaint on file.   Paul [name] is a 58 y.o. male.  Patient is a 58 year old male with past medical history significant for ESRD on hemodialysis, hypertension, CRAO, CHF, schizophrenia who presents today for dialysis.  Patient was previously established at a dialysis center but was kicked out.  He states that he has been unable to get set up by nephrology for another outpatient setting but they are actively working on this.  Last dialysis was on 02/22/2021.  No reported chest pain, short of breath, leg swelling, or any other acute changes that he has noted.  HPI     Prior to Admission medications   Medication Sig Start Date End Date Taking? Authorizing Provider  ARIPiprazole  ER (ABILIFY  MAINTENA) 400 MG PRSY prefilled syringe Inject 400 mg into the muscle every 28 (twenty-eight) days. 02/24/24   Plovsky, Elna, MD  ARIPiprazole  ER (ABILIFY  MAINTENA) 400 MG PRSY prefilled syringe Inject 400 mg into the muscle once for 1 dose. 02/24/24 02/24/24  Plovsky, Elna, MD  carvedilol  (COREG ) 6.25 MG tablet Take 1 tablet (6.25 mg total) by mouth 2 (two) times daily with a meal. 01/13/24   Delbert Clam, MD  isosorbide  mononitrate (IMDUR ) 30 MG 24 hr tablet Take 1 tablet (30 mg total) by mouth daily. 10/25/23   [name], MD  OVER THE COUNTER MEDICATION Take 1 capsule by mouth daily. Lion's Mane for memory    [provider]  rosuvastatin  (CRESTOR ) 5 MG tablet Take 1 tablet (5 mg total) by mouth daily. 03/24/23     sucroferric oxyhydroxide (VELPHORO ) 500 MG chewable tablet Chew 3 tablets (1,500 mg total) by mouth 3 (three) times daily with meals. Patient not taking: Reported on 02/24/2024 07/29/23   Tawkaliyar, Roya, DO    Allergies: Penicillin g    Review of Systems  All other systems reviewed and are  negative.   Updated Vital Signs There were no vitals taken for this visit.  Physical Exam Vitals and nursing note reviewed.  Constitutional:      General: He is not in acute distress.    Appearance: He is well-developed.  HENT:     Head: Normocephalic and atraumatic.  Eyes:     Conjunctiva/sclera: Conjunctivae normal.  Cardiovascular:     Rate and Rhythm: Normal rate and regular rhythm.     Heart sounds: No murmur heard. Pulmonary:     Effort: Pulmonary effort is normal. No respiratory distress.     Breath sounds: Normal breath sounds.  Abdominal:     Palpations: Abdomen is soft.     Tenderness: There is no abdominal tenderness.  Musculoskeletal:        General: No swelling.     Cervical back: Neck supple.  Skin:    General: Skin is warm and dry.     Capillary Refill: Capillary refill takes less than 2 seconds.  Neurological:     Mental Status: He is alert.  Psychiatric:        Mood and Affect: Mood normal.     (all labs ordered are listed, but only abnormal results are displayed) Labs Reviewed - No data to display  EKG: None  Radiology: No results found.   Procedures   Medications Ordered in the ED - No data to display  Medical Decision Making  This patient presents to the ED for concern of ESRD.  Differential diagnosis includes hyperkalemia, arrhythmia, CHF   Co morbidities that complicate patient evaluation:  ESRD on HD, HTN, CRAO to right eye, CHF, schizophrenia   Additional history obtained:  Additional history obtained from Epic chart review   Consultations obtained:  I requested consultation with the nephrologist, Dr. DOROTHA Blanch), and discussed lab and findings as well as pertinent plan -he will order HD.   Problem List / ED Course:  Patient here for hemodialysis.  History of ESRD and hemodialysis.  With lack of acute concerning findings, plan at this time is for hemodialysis after consultation with Dr.  DOROTHA Blanch, nephrology. Likely discharge after dialysis.   Social Determinants of Health:  ESRD on hemodialysis  Disposition:  Likely discharge after dialysis.  Final diagnoses:  ESRD on hemodialysis [facility])    ED Discharge Orders     None          Cecily Legrand LABOR, PA-C 02/25/24 1532    Horton, Roxie HERO, DO 02/27/24 0825

## 2024-02-27 ENCOUNTER — Emergency Department (HOSPITAL_COMMUNITY)
Admission: EM | Admit: 2024-02-27 | Discharge: 2024-02-27 | Disposition: A | Discharge status: Home / Self Care | Attending: Emergency Medicine | Admitting: Emergency Medicine

## 2024-02-27 ENCOUNTER — Other Ambulatory Visit: Payer: Self-pay

## 2024-02-27 DIAGNOSIS — Z992 Dependence on renal dialysis: Secondary | ICD-10-CM | POA: Insufficient documentation

## 2024-02-27 DIAGNOSIS — N186 End stage renal disease: Secondary | ICD-10-CM | POA: Diagnosis not present

## 2024-02-27 LAB — RENAL FUNCTION PANEL
Albumin: 3.5 g/dL (ref 3.5–5.0)
Anion gap: 23 — ABNORMAL HIGH (ref 5–15)
BUN: 96 mg/dL — ABNORMAL HIGH (ref 6–20)
CO2: 16 mmol/L — ABNORMAL LOW (ref 22–32)
Calcium: 6.8 mg/dL — ABNORMAL LOW (ref 8.9–10.3)
Chloride: 99 mmol/L (ref 98–111)
Creatinine, Ser: 14.07 mg/dL — ABNORMAL HIGH (ref 0.61–1.24)
GFR, Estimated: 4 mL/min — ABNORMAL LOW (ref >60)
Glucose, Bld: 112 mg/dL — ABNORMAL HIGH (ref 70–99)
Phosphorus: >30 mg/dL — ABNORMAL HIGH (ref 2.5–4.6)
Potassium: 4.9 mmol/L (ref 3.5–5.1)
Sodium: 138 mmol/L (ref 135–145)

## 2024-02-27 LAB — CBC
HCT: 28.7 % — ABNORMAL LOW (ref 39.0–52.0)
Hemoglobin: 9.7 g/dL — ABNORMAL LOW (ref 13.0–17.0)
MCH: 30.5 pg (ref 26.0–34.0)
MCHC: 33.8 g/dL (ref 30.0–36.0)
MCV: 90.3 fL (ref 80.0–100.0)
Platelets: 280 K/uL (ref 150–400)
RBC: 3.18 MIL/uL — ABNORMAL LOW (ref 4.22–5.81)
RDW: 18.3 % — ABNORMAL HIGH (ref 11.5–15.5)
WBC: 7.1 K/uL (ref 4.0–10.5)
nRBC: 0 % (ref 0.0–0.2)

## 2024-02-27 MED ORDER — ANTICOAGULANT SODIUM CITRATE 4% (200MG/5ML) IV SOLN: 5.0000 mL | Status: DC | PRN

## 2024-02-27 MED ORDER — CHLORHEXIDINE GLUCONATE CLOTH 2 % EX PADS: 6.0000 | MEDICATED_PAD | Freq: Every day | CUTANEOUS | Status: DC

## 2024-02-27 MED ORDER — HEPARIN SODIUM (PORCINE) 1000 UNIT/ML DIALYSIS: 1000.0000 [IU] | INTRAMUSCULAR | Status: DC | PRN

## 2024-02-27 MED ORDER — LIDOCAINE HCL (PF) 1 % IJ SOLN: 5.0000 mL | INTRAMUSCULAR | Status: DC | PRN

## 2024-02-27 MED ORDER — LIDOCAINE-PRILOCAINE 2.5-2.5 % EX CREA: 1.0000 | TOPICAL_CREAM | CUTANEOUS | Status: DC | PRN

## 2024-02-27 MED ORDER — ALTEPLASE 2 MG IJ SOLR: 2.0000 mg | Freq: Once | INTRAMUSCULAR | Status: DC | PRN

## 2024-02-27 MED ORDER — PENTAFLUOROPROP-TETRAFLUOROETH EX AERO: 1.0000 | INHALATION_SPRAY | CUTANEOUS | Status: DC | PRN

## 2024-02-27 NOTE — ED Provider Notes (Signed)
 [place] EMERGENCY DEPARTMENT AT [facility] Provider Note   CSN: [id number] Arrival date & time: 02/27/24  9340     Patient presents with: Vascular Access Problem   Paul [name] is a 58 y.o. male.   HPI 58 year old male end-stage renal disease on dialysis without dialysis at home.  Presents today for dialysis.  He is normally dialyzed Monday Wednesday Friday.  He reports dialysis on Wednesday with full session.  He reports mild dyspnea and no other problems today.  Specifically denies headache head injury chest pain, nausea, vomiting, diarrhea, abdominal pain. He reports that his fistula is in his right arm.    Prior to Admission medications   Medication Sig Start Date End Date Taking? Authorizing Provider  ARIPiprazole  ER (ABILIFY  MAINTENA) 400 MG PRSY prefilled syringe Inject 400 mg into the muscle every 28 (twenty-eight) days. 02/24/24   Plovsky, Elna, MD  ARIPiprazole  ER (ABILIFY  MAINTENA) 400 MG PRSY prefilled syringe Inject 400 mg into the muscle once for 1 dose. 02/24/24 02/24/24  Plovsky, Elna, MD  carvedilol  (COREG ) 6.25 MG tablet Take 1 tablet (6.25 mg total) by mouth 2 (two) times daily with a meal. 01/13/24   Delbert Clam, MD  isosorbide  mononitrate (IMDUR ) 30 MG 24 hr tablet Take 1 tablet (30 mg total) by mouth daily. 10/25/23   [name], MD  OVER THE COUNTER MEDICATION Take 1 capsule by mouth daily. Lion's Mane for memory    [provider]  rosuvastatin  (CRESTOR ) 5 MG tablet Take 1 tablet (5 mg total) by mouth daily. 03/24/23     sucroferric oxyhydroxide (VELPHORO ) 500 MG chewable tablet Chew 3 tablets (1,500 mg total) by mouth 3 (three) times daily with meals. Patient not taking: Reported on 02/24/2024 07/29/23   Tawkaliyar, Roya, DO    Allergies: Penicillin g    Review of Systems  Updated Vital Signs BP (!) 152/84 (BP Location: Left Arm)   Pulse 88   Temp (!) 97.5 F (36.4 C)   Resp 17   SpO2 100%   Physical  Exam Vitals and nursing note reviewed.  Constitutional:      Appearance: He is well-developed.  HENT:     Head: Normocephalic and atraumatic.     Right Ear: External ear normal.     Left Ear: External ear normal.     Nose: Nose normal.  Eyes:     Extraocular Movements: Extraocular movements intact.  Neck:     Trachea: No tracheal deviation.  Pulmonary:     Effort: Pulmonary effort is normal.  Musculoskeletal:        General: Normal range of motion.  Skin:    General: Skin is warm and dry.  Neurological:     Mental Status: He is alert and oriented to person, place, and time.  Psychiatric:        Mood and Affect: Mood normal.        Behavior: Behavior normal.     (all labs ordered are listed, but only abnormal results are displayed) Labs Reviewed - No data to display  EKG: None  Radiology: No results found.   Procedures   Medications Ordered in the ED  Chlorhexidine  Gluconate Cloth 2 % PADS 6 each (has no administration in time range)                                    Medical Decision Making  Patient with  end-stage renal disease on dialysis.  Patient presents today for dialysis. He is hemodynamically stable with respiratory status stable. Nephrology has been paged     Final diagnoses:  None    ED Discharge Orders     None          Levander Houston, MD 02/27/24 1538

## 2024-02-27 NOTE — ED Provider Notes (Signed)
 Pt returned from dialysis and felt well.  He's stable for d/c.   Dean Clarity, MD 02/27/24 [phone]

## 2024-02-27 NOTE — ED Triage Notes (Signed)
 Pt here for dialysis , Completed full treatment on Wednesday.

## 2024-02-27 NOTE — Progress Notes (Signed)
 Pt tolerated tx very well, no adverse events.  02/27/24 1648  Vitals  Temp 98.2 F (36.8 C)  BP 124/82  Pulse Rate 79  Resp 12  Weight 81.9 kg  Oxygen Therapy  SpO2 97 %  O2 Device Room Air  Patient Activity (if Appropriate) In bed  Pulse Oximetry Type Continuous  Oximetry Probe Site Changed No  Post Treatment  Dialyzer Clearance Clear  Liters Processed 68.3  Fluid Removed (mL) 1.5 mL  Tolerated HD Treatment Yes  AVG/AVF Arterial Site Held (minutes) 5 minutes  AVG/AVF Venous Site Held (minutes) 5 minutes

## 2024-02-27 NOTE — Progress Notes (Signed)
 Patient ID: Paul [name], male   DOB: [birth year], 58 y.o.   MRN: [id number] Consulted by Dr. Levander (EDP) to provide hemodialysis for Paul [name] who presents electively to the emergency room for this service.  He does not have an outpatient hemodialysis unit spot after discharge for behavioral problems.  His clinical status was discussed with Dr. Levander and recent labs reviewed to determine urgency.  Hemodialysis has been ordered for today with the plan to send him back to the emergency room for evaluation and likely discharge thereafter.  Gordy Blanch MD [facility]. Office # [phone] Pager # [phone] 8:40 AM

## 2024-02-27 NOTE — ED Notes (Signed)
 Unsuccessful attempt to draw PT labs, PT requests to be drawn upstairs

## 2024-03-01 ENCOUNTER — Encounter (HOSPITAL_COMMUNITY): Payer: Self-pay | Admitting: *Deleted

## 2024-03-01 ENCOUNTER — Emergency Department (HOSPITAL_COMMUNITY)
Admission: EM | Admit: 2024-03-01 | Discharge: 2024-03-01 | Discharge status: Against Medical Advice | Attending: Emergency Medicine | Admitting: Emergency Medicine

## 2024-03-01 ENCOUNTER — Other Ambulatory Visit: Payer: Self-pay

## 2024-03-01 DIAGNOSIS — Z992 Dependence on renal dialysis: Secondary | ICD-10-CM | POA: Insufficient documentation

## 2024-03-01 DIAGNOSIS — Z5329 Procedure and treatment not carried out because of patient's decision for other reasons: Secondary | ICD-10-CM | POA: Insufficient documentation

## 2024-03-01 DIAGNOSIS — I120 Hypertensive chronic kidney disease with stage 5 chronic kidney disease or end stage renal disease: Secondary | ICD-10-CM | POA: Diagnosis not present

## 2024-03-01 DIAGNOSIS — N186 End stage renal disease: Secondary | ICD-10-CM | POA: Insufficient documentation

## 2024-03-01 DIAGNOSIS — Z79899 Other long term (current) drug therapy: Secondary | ICD-10-CM | POA: Insufficient documentation

## 2024-03-01 MED ORDER — HEPARIN SODIUM (PORCINE) 1000 UNIT/ML DIALYSIS: 2500.0000 [IU] | Freq: Once | INTRAMUSCULAR | Status: DC

## 2024-03-01 MED ORDER — ALTEPLASE 2 MG IJ SOLR: 2.0000 mg | Freq: Once | INTRAMUSCULAR | Status: DC | PRN

## 2024-03-01 MED ORDER — CHLORHEXIDINE GLUCONATE CLOTH 2 % EX PADS: 6.0000 | MEDICATED_PAD | Freq: Every day | CUTANEOUS | Status: DC

## 2024-03-01 MED ORDER — PENTAFLUOROPROP-TETRAFLUOROETH EX AERO: 1.0000 | INHALATION_SPRAY | CUTANEOUS | Status: DC | PRN

## 2024-03-01 MED ORDER — ANTICOAGULANT SODIUM CITRATE 4% (200MG/5ML) IV SOLN
5.0000 mL | Status: DC | PRN
  Filled 2024-03-01: qty 5

## 2024-03-01 MED ORDER — HEPARIN SODIUM (PORCINE) 1000 UNIT/ML DIALYSIS: 1000.0000 [IU] | INTRAMUSCULAR | Status: DC | PRN

## 2024-03-01 MED ORDER — LIDOCAINE-PRILOCAINE 2.5-2.5 % EX CREA: 1.0000 | TOPICAL_CREAM | CUTANEOUS | Status: DC | PRN

## 2024-03-01 MED ORDER — LIDOCAINE HCL (PF) 1 % IJ SOLN: 5.0000 mL | INTRAMUSCULAR | Status: DC | PRN

## 2024-03-01 MED ORDER — NEPRO/CARBSTEADY PO LIQD
237.0000 mL | ORAL | Status: DC | PRN
  Filled 2024-03-01: qty 237

## 2024-03-01 MED ORDER — HEPARIN SODIUM (PORCINE) 1000 UNIT/ML IJ SOLN
INTRAMUSCULAR | Status: AC
  Filled 2024-03-01: qty 6

## 2024-03-01 MED ORDER — HEPARIN SODIUM (PORCINE) 1000 UNIT/ML DIALYSIS: 40.0000 [IU]/kg | INTRAMUSCULAR | Status: DC | PRN

## 2024-03-01 NOTE — Progress Notes (Signed)
  Received patient in bed to unit.   Informed consent signed and in chart.    TX duration: 3     Transported by  Hand-off given to patient's nurse.    Access used: rt AVF Access issues: none   Total UF removed: 1500 Medication(s) given: none Post HD VS: 132/55    Paul Mcnamara OakleyLPN [facility]

## 2024-03-01 NOTE — Procedures (Signed)
 Asked to see this patient for hospital dialysis. Pt was discharged from his outpatient unit for behavior issues. The plan will be for ED HD. Pt is not to be admitted at this time. Pt will go to the dialysis unit when they are ready for the patient. When dialysis is completed pt will be sent back to ED for reassessment.   Vitals:   03/01/24 0733 03/01/24 0734 03/01/24 1300 03/01/24 1330  BP:  (!) 146/80  (!) 144/93  Pulse:  80  71  Resp:  17  13  Temp:  (!) 97.4 F (36.3 C) (!) 97.4 F (36.3 C)   TempSrc:  Oral Oral   SpO2:  99% 100% 100%  Weight: 81.9 kg  85.2 kg   Height: 5' 3 (1.6 m)      Pt seen in HD unit, no complications at this time.     I was present at the procedure, reviewed the HD regimen and made appropriate changes.   Myer Fret MD  [facility] 03/01/2024, 2:13 PM    Recent Labs  Lab 02/25/24 1456 02/27/24 1047  HGB 9.7* 9.7*  ALBUMIN 3.5 3.5  CALCIUM  7.2* 6.8*  PHOS >30.0* >30.0*  CREATININE 12.54* 14.07*  K 4.8 4.9   No results for input(s): IRON , TIBC, FERRITIN in the last 168 hours. Inpatient medications:  ARIPiprazole  ER  400 mg Intramuscular Q28 days   Chlorhexidine  Gluconate Cloth  6 each Topical Q0600

## 2024-03-01 NOTE — ED Triage Notes (Signed)
 Here for dialysis not complaints

## 2024-03-01 NOTE — ED Provider Notes (Signed)
 [place] EMERGENCY DEPARTMENT AT [facility] Provider Note   CSN: [id number] Arrival date & time: 03/01/24  9270     Patient presents with: needs dialysis   Paul [name] is a 58 y.o. male with past medical history significant for obesity, hypertension, ESRD on dialysis, schizophrenia who is here for routine dialysis. No other complaints today. No missed sessions.    HPI     Prior to Admission medications   Medication Sig Start Date End Date Taking? Authorizing Provider  ARIPiprazole  ER (ABILIFY  MAINTENA) 400 MG PRSY prefilled syringe Inject 400 mg into the muscle every 28 (twenty-eight) days. 02/24/24   Plovsky, Elna, MD  ARIPiprazole  ER (ABILIFY  MAINTENA) 400 MG PRSY prefilled syringe Inject 400 mg into the muscle once for 1 dose. 02/24/24 02/24/24  Plovsky, Elna, MD  carvedilol  (COREG ) 6.25 MG tablet Take 1 tablet (6.25 mg total) by mouth 2 (two) times daily with a meal. 01/13/24   Delbert Clam, MD  isosorbide  mononitrate (IMDUR ) 30 MG 24 hr tablet Take 1 tablet (30 mg total) by mouth daily. 10/25/23   [name], MD  OVER THE COUNTER MEDICATION Take 1 capsule by mouth daily. Lion's Mane for memory    [provider]  rosuvastatin  (CRESTOR ) 5 MG tablet Take 1 tablet (5 mg total) by mouth daily. 03/24/23     sucroferric oxyhydroxide (VELPHORO ) 500 MG chewable tablet Chew 3 tablets (1,500 mg total) by mouth 3 (three) times daily with meals. Patient not taking: Reported on 02/24/2024 07/29/23   Tawkaliyar, Roya, DO    Allergies: Penicillin g    Review of Systems  All other systems reviewed and are negative.   Updated Vital Signs BP (!) 146/80 (BP Location: Left Arm)   Pulse 80   Temp (!) 97.4 F (36.3 C) (Oral)   Resp 17   Ht 5' 3 (1.6 m)   Wt 81.9 kg   SpO2 99%   BMI 31.98 kg/m   Physical Exam Vitals and nursing note reviewed.  Constitutional:      General: He is not in acute distress.    Appearance: Normal appearance.  HENT:      Head: Normocephalic and atraumatic.  Eyes:     General:        Right eye: No discharge.        Left eye: No discharge.  Cardiovascular:     Rate and Rhythm: Normal rate and regular rhythm.     Heart sounds: No murmur heard.    No friction rub. No gallop.  Pulmonary:     Effort: Pulmonary effort is normal.     Breath sounds: Normal breath sounds.  Abdominal:     General: Bowel sounds are normal.     Palpations: Abdomen is soft.  Skin:    General: Skin is warm and dry.     Capillary Refill: Capillary refill takes less than 2 seconds.  Neurological:     Mental Status: He is alert and oriented to person, place, and time.  Psychiatric:        Mood and Affect: Mood normal.        Behavior: Behavior normal.     (all labs ordered are listed, but only abnormal results are displayed) Labs Reviewed - No data to display  EKG: None  Radiology: No results found.   Procedures   Medications Ordered in the ED  Chlorhexidine  Gluconate Cloth 2 % PADS 6 each (has no administration in time range)  Medical Decision Making  Well-known patient to the department here for routine dialysis, he is on a Monday Wednesday Friday schedule.  I spoke with Dr. Geralynn with nephrology who accepts patient for dialysis.  Considered routine lab work, however patient has well-appearing, stable vital signs other than some hypertension, blood pressure 146/80 and has not missed any sessions, I think reasonable to forego lab work at this time. Will go to dialysis and discharge after the fact.   Final diagnoses:  None    ED Discharge Orders     None          Rosan Sherlean VEAR DEVONNA 03/01/24 9072    Melvenia Motto, MD 03/01/24 [phone]

## 2024-03-02 ENCOUNTER — Other Ambulatory Visit (HOSPITAL_COMMUNITY): Payer: Self-pay

## 2024-03-03 ENCOUNTER — Emergency Department (HOSPITAL_COMMUNITY)
Admission: EM | Admit: 2024-03-03 | Discharge: 2024-03-03 | Discharge status: Against Medical Advice | Attending: Emergency Medicine | Admitting: Emergency Medicine

## 2024-03-03 ENCOUNTER — Other Ambulatory Visit: Payer: Self-pay

## 2024-03-03 DIAGNOSIS — D631 Anemia in chronic kidney disease: Secondary | ICD-10-CM | POA: Insufficient documentation

## 2024-03-03 DIAGNOSIS — N186 End stage renal disease: Secondary | ICD-10-CM | POA: Diagnosis not present

## 2024-03-03 DIAGNOSIS — Z79899 Other long term (current) drug therapy: Secondary | ICD-10-CM | POA: Insufficient documentation

## 2024-03-03 DIAGNOSIS — Z992 Dependence on renal dialysis: Secondary | ICD-10-CM | POA: Diagnosis not present

## 2024-03-03 DIAGNOSIS — I120 Hypertensive chronic kidney disease with stage 5 chronic kidney disease or end stage renal disease: Secondary | ICD-10-CM | POA: Diagnosis not present

## 2024-03-03 LAB — CBC
HCT: 26.4 % — ABNORMAL LOW (ref 39.0–52.0)
Hemoglobin: 8.8 g/dL — ABNORMAL LOW (ref 13.0–17.0)
MCH: 30.3 pg (ref 26.0–34.0)
MCHC: 33.3 g/dL (ref 30.0–36.0)
MCV: 91 fL (ref 80.0–100.0)
Platelets: 236 K/uL (ref 150–400)
RBC: 2.9 MIL/uL — ABNORMAL LOW (ref 4.22–5.81)
RDW: 17.4 % — ABNORMAL HIGH (ref 11.5–15.5)
WBC: 5.9 K/uL (ref 4.0–10.5)
nRBC: 0 % (ref 0.0–0.2)

## 2024-03-03 LAB — RENAL FUNCTION PANEL
Albumin: 3.2 g/dL — ABNORMAL LOW (ref 3.5–5.0)
Anion gap: 17 — ABNORMAL HIGH (ref 5–15)
BUN: 62 mg/dL — ABNORMAL HIGH (ref 6–20)
CO2: 19 mmol/L — ABNORMAL LOW (ref 22–32)
Calcium: 7.1 mg/dL — ABNORMAL LOW (ref 8.9–10.3)
Chloride: 102 mmol/L (ref 98–111)
Creatinine, Ser: 12.24 mg/dL — ABNORMAL HIGH (ref 0.61–1.24)
GFR, Estimated: 4 mL/min — ABNORMAL LOW (ref >60)
Glucose, Bld: 99 mg/dL (ref 70–99)
Phosphorus: 11.3 mg/dL — ABNORMAL HIGH (ref 2.5–4.6)
Potassium: 4.4 mmol/L (ref 3.5–5.1)
Sodium: 138 mmol/L (ref 135–145)

## 2024-03-03 MED ORDER — DARBEPOETIN ALFA 150 MCG/0.3ML IJ SOSY
150.0000 ug | PREFILLED_SYRINGE | Freq: Once | INTRAMUSCULAR | Status: AC
  Administered 2024-03-03: 150 ug via SUBCUTANEOUS
  Filled 2024-03-03: qty 0.3

## 2024-03-03 MED ORDER — ALTEPLASE 2 MG IJ SOLR: 2.0000 mg | Freq: Once | INTRAMUSCULAR | Status: DC | PRN

## 2024-03-03 MED ORDER — HEPARIN SODIUM (PORCINE) 1000 UNIT/ML DIALYSIS: 1000.0000 [IU] | INTRAMUSCULAR | Status: DC | PRN

## 2024-03-03 MED ORDER — HEPARIN SODIUM (PORCINE) 1000 UNIT/ML DIALYSIS
2400.0000 [IU] | Freq: Once | INTRAMUSCULAR | Status: AC
  Administered 2024-03-03: 2400 [IU] via INTRAVENOUS_CENTRAL

## 2024-03-03 MED ORDER — CHLORHEXIDINE GLUCONATE CLOTH 2 % EX PADS: 6.0000 | MEDICATED_PAD | Freq: Every day | CUTANEOUS | Status: DC

## 2024-03-03 MED ORDER — HEPARIN SODIUM (PORCINE) 1000 UNIT/ML IJ SOLN: 2400.0000 [IU] | Freq: Once | INTRAMUSCULAR | Status: DC

## 2024-03-03 MED ORDER — PENTAFLUOROPROP-TETRAFLUOROETH EX AERO: 1.0000 | INHALATION_SPRAY | CUTANEOUS | Status: DC | PRN

## 2024-03-03 MED ORDER — ANTICOAGULANT SODIUM CITRATE 4% (200MG/5ML) IV SOLN
5.0000 mL | Status: DC | PRN
  Filled 2024-03-03: qty 5

## 2024-03-03 MED ORDER — LIDOCAINE-PRILOCAINE 2.5-2.5 % EX CREA: 1.0000 | TOPICAL_CREAM | CUTANEOUS | Status: DC | PRN

## 2024-03-03 MED ORDER — LIDOCAINE HCL (PF) 1 % IJ SOLN: 5.0000 mL | INTRAMUSCULAR | Status: DC | PRN

## 2024-03-03 MED ORDER — NEPRO/CARBSTEADY PO LIQD
237.0000 mL | ORAL | Status: DC | PRN
  Filled 2024-03-03: qty 237

## 2024-03-03 MED ORDER — HEPARIN SODIUM (PORCINE) 1000 UNIT/ML IJ SOLN
INTRAMUSCULAR | Status: AC
  Filled 2024-03-03: qty 3

## 2024-03-03 NOTE — ED Notes (Signed)
 Transported to dialysis.

## 2024-03-03 NOTE — ED Triage Notes (Signed)
 Patient is here for his routine hemodialysis treatment . No other complaints .

## 2024-03-03 NOTE — Procedures (Signed)
 HD Note:  Some information was entered later than the data was gathered due to patient care needs. The stated time with the data is accurate.  Received patient in bed to unit.   Alert and oriented.   Informed consent signed and in chart.   Access used: Upper right arm fistula Access issues: None  Patient tolerated treatment well.   TX duration:3.5 hours  Alert, without acute distress.  Total UF removed: 1500 ml  Hand-off given to patient's nurse.   Transported back to the room   Kenyada Hy L. Lenon, RN [facility].

## 2024-03-03 NOTE — ED Provider Notes (Signed)
 [name] Provider Note   CSN: [id number] Arrival date & time: 03/03/24  9295     Patient presents with: Hemodialysis   Paul [name] is a 58 y.o. male is a 58 y.o. male with past medical history significant for obesity, hypertension, ESRD on dialysis, schizophrenia who is here for routine dialysis. No other complaints today. No missed sessions.    HPI     Prior to Admission medications   Medication Sig Start Date End Date Taking? Authorizing Provider  ARIPiprazole  ER (ABILIFY  MAINTENA) 400 MG PRSY prefilled syringe Inject 400 mg into the muscle every 28 (twenty-eight) days. 02/24/24   Plovsky, Elna, MD  ARIPiprazole  ER (ABILIFY  MAINTENA) 400 MG PRSY prefilled syringe Inject 400 mg into the muscle once for 1 dose. 02/24/24 02/24/24  Plovsky, Elna, MD  carvedilol  (COREG ) 6.25 MG tablet Take 1 tablet (6.25 mg total) by mouth 2 (two) times daily with a meal. 01/13/24   Delbert Clam, MD  isosorbide  mononitrate (IMDUR ) 30 MG 24 hr tablet Take 1 tablet (30 mg total) by mouth daily. 10/25/23   [name], MD  OVER THE COUNTER MEDICATION Take 1 capsule by mouth daily. Lion's Mane for memory    [provider]  rosuvastatin  (CRESTOR ) 5 MG tablet Take 1 tablet (5 mg total) by mouth daily. 03/24/23     sucroferric oxyhydroxide (VELPHORO ) 500 MG chewable tablet Chew 3 tablets (1,500 mg total) by mouth 3 (three) times daily with meals. Patient not taking: Reported on 02/24/2024 07/29/23   Tawkaliyar, Roya, DO    Allergies: Penicillin g    Review of Systems  All other systems reviewed and are negative.   Updated Vital Signs BP (!) 147/95 (BP Location: Left Arm)   Pulse 83   Temp (!) 97.4 F (36.3 C) (Oral)   Resp 19   SpO2 100%   Physical Exam Vitals and nursing note reviewed.  Constitutional:      General: He is not in acute distress.    Appearance: Normal appearance.  HENT:     Head: Normocephalic and  atraumatic.  Eyes:     General:        Right eye: No discharge.        Left eye: No discharge.  Cardiovascular:     Rate and Rhythm: Normal rate and regular rhythm.  Pulmonary:     Effort: Pulmonary effort is normal. No respiratory distress.  Musculoskeletal:        General: No deformity.  Skin:    General: Skin is warm and dry.  Neurological:     Mental Status: He is alert and oriented to person, place, and time.  Psychiatric:        Mood and Affect: Mood normal.        Behavior: Behavior normal.     (all labs ordered are listed, but only abnormal results are displayed) Labs Reviewed - No data to display  EKG: None  Radiology: No results found.   Procedures   Medications Ordered in the ED  Chlorhexidine  Gluconate Cloth 2 % PADS 6 each (has no administration in time range)                                    Medical Decision Making  Well-known patient to the department here for routine dialysis, he is on a Monday Wednesday Friday schedule.  I spoke with  Dr. Geralynn with nephrology who accepts patient for dialysis.  Considered routine lab work, however patient has well-appearing, stable vital signs other than some hypertension, blood pressure 147/95, and has not missed any sessions, I think reasonable to forego lab work at this time. Will go to dialysis and discharge after the fact.   Final diagnoses:  None    ED Discharge Orders     None          Rosan Sherlean VEAR DEVONNA 03/03/24 0901    Randol Simmonds, MD 03/04/24 [phone]

## 2024-03-03 NOTE — Procedures (Signed)
 Asked to see this patient for hospital dialysis. Pt was discharged from his outpatient unit for behavior issues. The plan will be for ED HD. Pt is not to be admitted at this time. Pt will go to the dialysis unit when they are ready for the patient. When dialysis is completed pt will be sent back to ED for reassessment.   Vitals:   03/03/24 1402 03/03/24 1416 03/03/24 1430 03/03/24 1501  BP: (!) 145/100 (!) 160/95 (!) 152/95 (!) 141/97  Pulse: 71 70 79 64  Resp: (!) 21 14 16 13   Temp:      TempSrc:      SpO2: 97% 100% 98% 98%  Weight:       Last OP HD unit info from 07/2023 -->  4h  B400 76.5kg  2K bath RUA AVF Heparin  2400    Clinical issues other than volume: 1) Anemia of esrd:  - pt dose not have a nephrologist or HD unit so does not have access to ESA or IV iron , other than when here for hospital HD sessions - last tsat 24% on 12/12/23  - got IV venofer  200mg  10/05  - recent Hb around 8.5- 10 - last 3 doses of darbepoeitin here -->   - 100 mcg 9/19 - 150 mcg 10/01 - 150 mcg 10/13 - due for SQ darbe today, will order 150 mcg sq  I was present at the procedure, reviewed the HD regimen and made appropriate changes.   Myer Fret MD  [facility] 03/03/2024, 3:38 PM    Recent Labs  Lab 02/27/24 1047 03/03/24 1430  HGB 9.7* 8.8*  ALBUMIN 3.5  --   CALCIUM  6.8*  --   PHOS >30.0*  --   CREATININE 14.07*  --   K 4.9  --    No results for input(s): IRON , TIBC, FERRITIN in the last 168 hours. Inpatient medications:  Chlorhexidine  Gluconate Cloth  6 each Topical Q0600   heparin  sodium (porcine)  2,400 Units Intracatheter Once    anticoagulant sodium citrate      alteplase , anticoagulant sodium citrate , feeding supplement (NEPRO CARB STEADY), heparin , lidocaine  (PF), lidocaine -prilocaine , pentafluoroprop-tetrafluoroeth

## 2024-03-03 NOTE — ED Notes (Signed)
 Pt returned from dialysis, did not wait for discharge papers.

## 2024-03-05 ENCOUNTER — Encounter (HOSPITAL_COMMUNITY): Payer: Self-pay

## 2024-03-05 ENCOUNTER — Emergency Department (HOSPITAL_COMMUNITY)
Admission: EM | Admit: 2024-03-05 | Discharge: 2024-03-06 | Disposition: A | Discharge status: Home / Self Care | Attending: Emergency Medicine | Admitting: Emergency Medicine

## 2024-03-05 ENCOUNTER — Other Ambulatory Visit: Payer: Self-pay

## 2024-03-05 DIAGNOSIS — N186 End stage renal disease: Secondary | ICD-10-CM | POA: Diagnosis not present

## 2024-03-05 DIAGNOSIS — I509 Heart failure, unspecified: Secondary | ICD-10-CM | POA: Insufficient documentation

## 2024-03-05 DIAGNOSIS — I132 Hypertensive heart and chronic kidney disease with heart failure and with stage 5 chronic kidney disease, or end stage renal disease: Secondary | ICD-10-CM | POA: Diagnosis not present

## 2024-03-05 DIAGNOSIS — Z79899 Other long term (current) drug therapy: Secondary | ICD-10-CM | POA: Diagnosis not present

## 2024-03-05 DIAGNOSIS — Z992 Dependence on renal dialysis: Secondary | ICD-10-CM | POA: Insufficient documentation

## 2024-03-05 DIAGNOSIS — I120 Hypertensive chronic kidney disease with stage 5 chronic kidney disease or end stage renal disease: Secondary | ICD-10-CM | POA: Diagnosis not present

## 2024-03-05 LAB — CBC WITH DIFFERENTIAL/PLATELET
Abs Immature Granulocytes: 0.05 K/uL (ref 0.00–0.07)
Basophils Absolute: 0.1 K/uL (ref 0.0–0.1)
Basophils Relative: 1 %
Eosinophils Absolute: 0.3 K/uL (ref 0.0–0.5)
Eosinophils Relative: 5 %
HCT: 26.1 % — ABNORMAL LOW (ref 39.0–52.0)
Hemoglobin: 8.6 g/dL — ABNORMAL LOW (ref 13.0–17.0)
Immature Granulocytes: 1 %
Lymphocytes Relative: 18 %
Lymphs Abs: 1.2 K/uL (ref 0.7–4.0)
MCH: 30.4 pg (ref 26.0–34.0)
MCHC: 33 g/dL (ref 30.0–36.0)
MCV: 92.2 fL (ref 80.0–100.0)
Monocytes Absolute: 0.8 K/uL (ref 0.1–1.0)
Monocytes Relative: 12 %
Neutro Abs: 4.1 K/uL (ref 1.7–7.7)
Neutrophils Relative %: 63 %
Platelets: 221 K/uL (ref 150–400)
RBC: 2.83 MIL/uL — ABNORMAL LOW (ref 4.22–5.81)
RDW: 17.4 % — ABNORMAL HIGH (ref 11.5–15.5)
WBC: 6.5 K/uL (ref 4.0–10.5)
nRBC: 0 % (ref 0.0–0.2)

## 2024-03-05 MED ORDER — CHLORHEXIDINE GLUCONATE CLOTH 2 % EX PADS: 6.0000 | MEDICATED_PAD | Freq: Every day | CUTANEOUS | Status: DC

## 2024-03-05 MED ORDER — HEPARIN SODIUM (PORCINE) 1000 UNIT/ML IJ SOLN
INTRAMUSCULAR | Status: AC
Start: 2024-03-05 — End: 2024-03-05
  Filled 2024-03-05: qty 2

## 2024-03-05 NOTE — Procedures (Signed)
 Asked to see this patient for hospital dialysis. Pt was discharged from his outpatient unit for behavior issues. The plan will be for ED HD. Pt is not to be admitted at this time. Pt will go to the dialysis unit when they are ready for the patient. When dialysis is completed pt will be sent back to ED for reassessment.   Vitals:   03/05/24 0652 03/05/24 1138 03/05/24 1418 03/05/24 1423  BP: (!) 162/91 132/84 137/89 (!) 141/89  Pulse: 89 79 79 76  Resp: 15 16 15 15   Temp: 97.8 F (36.6 C) 97.8 F (36.6 C) (!) 97.5 F (36.4 C)   TempSrc:  Oral    SpO2: 98% 100% 100% 100%  Weight:   86.1 kg    Pt seen in HD Unit. No c/o's today. Tolerating UF goal of 1.5 L well at this time.     I was present at the procedure, reviewed the HD regimen and made appropriate changes.   Myer Fret MD  [facility] 03/05/2024, 3:09 PM    Recent Labs  Lab 03/03/24 1430  HGB 8.8*  ALBUMIN 3.2*  CALCIUM  7.1*  PHOS 11.3*  CREATININE 12.24*  K 4.4   No results for input(s): IRON , TIBC, FERRITIN in the last 168 hours. Inpatient medications:  Chlorhexidine  Gluconate Cloth  6 each Topical Q0600

## 2024-03-05 NOTE — ED Notes (Signed)
Consent at bedside.  

## 2024-03-05 NOTE — ED Notes (Signed)
 ED Provider at bedside.

## 2024-03-05 NOTE — ED Notes (Signed)
 Dialysis reports they should be ready for him within the hour.

## 2024-03-05 NOTE — Progress Notes (Signed)
 Hemodialysis completed,1.5 liters ultrafiltration, condition stable post treatment, and this patient was taken by stretcher to the bridge area of the emergency department as instructed by the Emergency room supervisor who was unable to talk on the phone at the time.

## 2024-03-05 NOTE — ED Provider Notes (Signed)
 [place] EMERGENCY DEPARTMENT AT [facility] Provider Note   CSN: [id number] Arrival date & time: 03/05/24  9352     Patient presents with: ESRD   Paul [name] is a 58 y.o. male.   HPI   58 year old male presents emergency department with request of routine dialysis.  Most recently received full dialysis session 03/03/2024.  No additional complaints at this time.  Denies any fevers, chills, chest pain, shortness of breath, abdominal pain, nausea and vomiting.  Patient was discharged from outpatient unit due to behavioral issues with plans for routine dialysis in the ED at this time per nephrology.  Past medical history significant for hypertension, schizophrenia, ESRD on hemodialysis, CHF, bipolar disorder  Prior to Admission medications   Medication Sig Start Date End Date Taking? Authorizing Provider  ARIPiprazole  ER (ABILIFY  MAINTENA) 400 MG PRSY prefilled syringe Inject 400 mg into the muscle every 28 (twenty-eight) days. 02/24/24   Plovsky, Elna, MD  ARIPiprazole  ER (ABILIFY  MAINTENA) 400 MG PRSY prefilled syringe Inject 400 mg into the muscle once for 1 dose. 02/24/24 02/24/24  Plovsky, Elna, MD  carvedilol  (COREG ) 6.25 MG tablet Take 1 tablet (6.25 mg total) by mouth 2 (two) times daily with a meal. 01/13/24   Delbert Clam, MD  isosorbide  mononitrate (IMDUR ) 30 MG 24 hr tablet Take 1 tablet (30 mg total) by mouth daily. 10/25/23   [name], MD  OVER THE COUNTER MEDICATION Take 1 capsule by mouth daily. Lion[employer] Mane for memory    [provider]  rosuvastatin  (CRESTOR ) 5 MG tablet Take 1 tablet (5 mg total) by mouth daily. 03/24/23     sucroferric oxyhydroxide (VELPHORO ) 500 MG chewable tablet Chew 3 tablets (1,500 mg total) by mouth 3 (three) times daily with meals. Patient not taking: Reported on 02/24/2024 07/29/23   Tawkaliyar, Roya, DO    Allergies: Penicillin g    Review of Systems  All other systems reviewed and are  negative.   Updated Vital Signs BP (!) 162/91 (BP Location: Left Arm)   Pulse 89   Temp 97.8 F (36.6 C)   Resp 15   SpO2 98%   Physical Exam Vitals and nursing note reviewed.  Constitutional:      General: He is not in acute distress.    Appearance: He is well-developed.  HENT:     Head: Normocephalic and atraumatic.  Eyes:     Conjunctiva/sclera: Conjunctivae normal.  Cardiovascular:     Rate and Rhythm: Normal rate and regular rhythm.     Heart sounds: No murmur heard. Pulmonary:     Effort: Pulmonary effort is normal. No respiratory distress.     Comments: Faint wheeze bilaterally. Abdominal:     Palpations: Abdomen is soft.     Tenderness: There is no abdominal tenderness.  Musculoskeletal:        General: No swelling.     Cervical back: Neck supple.     Right lower leg: No edema.     Left lower leg: No edema.  Skin:    General: Skin is warm and dry.     Capillary Refill: Capillary refill takes less than 2 seconds.  Neurological:     Mental Status: He is alert.  Psychiatric:        Mood and Affect: Mood normal.     (all labs ordered are listed, but only abnormal results are displayed) Labs Reviewed - No data to display  EKG: None  Radiology: No results found.   Procedures  Medications Ordered in the ED - No data to display  Clinical Course as of 03/05/24 1031  Fri Mar 05, 2024  9146 Consulted to nephrology Dr. Geralynn regarding the patient.  Will plan for dialysis and discharge after. [CR]    Clinical Course User Index [CR] Silver Wonda LABOR, PA                                 Medical Decision Making  This patient presents to the ED for concern of hemodialysis, this involves an extensive number of treatment options, and is a complaint that carries with it a high risk of complications and morbidity.  The differential diagnosis includes ESRD, volume overload, electrolyte derangement, other   Co morbidities that complicate the patient  evaluation  See HPI   Additional history obtained:  Additional history obtained from EMR External records from outside source obtained and reviewed including hospital records   Lab Tests:  N/a   Imaging Studies ordered:  N/a   Cardiac Monitoring: / EKG:  The patient was maintained on a cardiac monitor.  I personally viewed and interpreted the cardiac monitored which showed an underlying rhythm of: Rhythm   Consultations Obtained:  See ED course  Problem List / ED Course / Critical interventions / Medication management  ESRD on dialysis Reevaluation of the patient showed that the patient stayed the same I have reviewed the patients home medicines and have made adjustments as needed   Social Determinants of Health:  Former cigarette use.  Denies illicit drug use.   Test / Admission - Considered:  ESRD on dialysis Vitals signs significant for hypertension blood pressure 162/91. Otherwise within normal range and stable throughout visit. 58 year old male presents emergency department with request of routine dialysis.  Most recently received full dialysis session 03/03/2024.  No additional complaints at this time.  Denies any fevers, chills, chest pain, shortness of breath, abdominal pain, nausea and vomiting.  Patient was discharged from outpatient unit due to behavioral issues with plans for routine dialysis in the ED at this time per nephrology. On exam, faint expiratory wheeze appreciated bilateral lung fields.  No signs of overt volume overload.  Patient without any complaints of symptoms at this time only here for routine dialysis.  Consulted nephrology who agreed with admission.  Plan to discharge after dialysis session.      Final diagnoses:  None    ED Discharge Orders     None          Silver Wonda LABOR, [place] 03/05/24 9144    Dasie Faden, MD 03/06/24 [phone]

## 2024-03-05 NOTE — ED Notes (Signed)
Pt given a sandwich and soda.  

## 2024-03-05 NOTE — ED Triage Notes (Signed)
 Patient is here for his routine hemodialysis treatment .   No other complaints   Last time received dialysis 03/03/24 here as [facility].

## 2024-03-06 NOTE — ED Notes (Signed)
 Pt noted to not be in bed or in department.  Per Consulting Civil Engineer, the Pt has not been in the department all night.  It is assumed the Pt left shortly after returning from dialysis.

## 2024-03-08 ENCOUNTER — Encounter (HOSPITAL_COMMUNITY): Payer: Self-pay

## 2024-03-08 ENCOUNTER — Emergency Department (HOSPITAL_COMMUNITY)
Admission: EM | Admit: 2024-03-08 | Discharge: 2024-03-08 | Discharge status: Against Medical Advice | Attending: Emergency Medicine | Admitting: Emergency Medicine

## 2024-03-08 ENCOUNTER — Other Ambulatory Visit: Payer: Self-pay

## 2024-03-08 DIAGNOSIS — Z5321 Procedure and treatment not carried out due to patient leaving prior to being seen by health care provider: Secondary | ICD-10-CM | POA: Insufficient documentation

## 2024-03-08 DIAGNOSIS — Z992 Dependence on renal dialysis: Secondary | ICD-10-CM | POA: Insufficient documentation

## 2024-03-08 DIAGNOSIS — N186 End stage renal disease: Secondary | ICD-10-CM | POA: Insufficient documentation

## 2024-03-08 DIAGNOSIS — I132 Hypertensive heart and chronic kidney disease with heart failure and with stage 5 chronic kidney disease, or end stage renal disease: Secondary | ICD-10-CM | POA: Insufficient documentation

## 2024-03-08 DIAGNOSIS — I120 Hypertensive chronic kidney disease with stage 5 chronic kidney disease or end stage renal disease: Secondary | ICD-10-CM | POA: Diagnosis not present

## 2024-03-08 DIAGNOSIS — I509 Heart failure, unspecified: Secondary | ICD-10-CM | POA: Diagnosis not present

## 2024-03-08 DIAGNOSIS — R0602 Shortness of breath: Secondary | ICD-10-CM | POA: Diagnosis not present

## 2024-03-08 LAB — CBC WITH DIFFERENTIAL/PLATELET
Abs Immature Granulocytes: 0.03 K/uL (ref 0.00–0.07)
Basophils Absolute: 0 K/uL (ref 0.0–0.1)
Basophils Relative: 1 %
Eosinophils Absolute: 0.3 K/uL (ref 0.0–0.5)
Eosinophils Relative: 4 %
HCT: 26.4 % — ABNORMAL LOW (ref 39.0–52.0)
Hemoglobin: 8.7 g/dL — ABNORMAL LOW (ref 13.0–17.0)
Immature Granulocytes: 0 %
Lymphocytes Relative: 15 %
Lymphs Abs: 1.1 K/uL (ref 0.7–4.0)
MCH: 30.2 pg (ref 26.0–34.0)
MCHC: 33 g/dL (ref 30.0–36.0)
MCV: 91.7 fL (ref 80.0–100.0)
Monocytes Absolute: 0.9 K/uL (ref 0.1–1.0)
Monocytes Relative: 12 %
Neutro Abs: 5.1 K/uL (ref 1.7–7.7)
Neutrophils Relative %: 68 %
Platelets: 220 K/uL (ref 150–400)
RBC: 2.88 MIL/uL — ABNORMAL LOW (ref 4.22–5.81)
RDW: 17.7 % — ABNORMAL HIGH (ref 11.5–15.5)
WBC: 7.5 K/uL (ref 4.0–10.5)
nRBC: 0 % (ref 0.0–0.2)

## 2024-03-08 LAB — RENAL FUNCTION PANEL
Albumin: 3.4 g/dL — ABNORMAL LOW (ref 3.5–5.0)
Anion gap: 18 — ABNORMAL HIGH (ref 5–15)
BUN: 85 mg/dL — ABNORMAL HIGH (ref 6–20)
CO2: 22 mmol/L (ref 22–32)
Calcium: 7.4 mg/dL — ABNORMAL LOW (ref 8.9–10.3)
Chloride: 97 mmol/L — ABNORMAL LOW (ref 98–111)
Creatinine, Ser: 12.59 mg/dL — ABNORMAL HIGH (ref 0.61–1.24)
GFR, Estimated: 4 mL/min — ABNORMAL LOW (ref >60)
Glucose, Bld: 92 mg/dL (ref 70–99)
Phosphorus: 9.4 mg/dL — ABNORMAL HIGH (ref 2.5–4.6)
Potassium: 4.6 mmol/L (ref 3.5–5.1)
Sodium: 137 mmol/L (ref 135–145)

## 2024-03-08 LAB — HEPATITIS B SURFACE ANTIGEN: Hepatitis B Surface Ag: NONREACTIVE

## 2024-03-08 MED ORDER — DARBEPOETIN ALFA 100 MCG/0.5ML IJ SOSY: 100.0000 ug | PREFILLED_SYRINGE | Freq: Once | INTRAMUSCULAR | Status: DC

## 2024-03-08 MED ORDER — CHLORHEXIDINE GLUCONATE CLOTH 2 % EX PADS
6.0000 | MEDICATED_PAD | Freq: Every day | CUTANEOUS | Status: DC
Start: 2024-03-08 — End: 2024-03-08

## 2024-03-08 NOTE — ED Notes (Signed)
Consent at bedside.  

## 2024-03-08 NOTE — ED Notes (Signed)
 Pt has no medical complaints.  Pt is only here for routine dialysis.

## 2024-03-08 NOTE — ED Triage Notes (Signed)
 Pt here for routine dialysis. Pt last treatment on Friday, no complaints at present.

## 2024-03-08 NOTE — ED Provider Notes (Signed)
 [place] EMERGENCY DEPARTMENT AT [facility] Provider Note   CSN: [id number] Arrival date & time: 03/08/24  9355     Patient presents with: Needs Dialysis   Paul [name] is a 58 y.o. male.   Patient is a 58 year old male who has a history of end-stage renal disease and is on dialysis.  He presents here for his routine dialysis.  He complains of some mild shortness of breath which says this somewhat typical for him.  No cough or cold symptoms.  No leg swelling.  No associated chest pain.  No fevers.  His last dialysis session was 3 days ago on Friday.       Prior to Admission medications   Medication Sig Start Date End Date Taking? Authorizing Provider  ARIPiprazole  ER (ABILIFY  MAINTENA) 400 MG PRSY prefilled syringe Inject 400 mg into the muscle every 28 (twenty-eight) days. 02/24/24   Plovsky, Elna, MD  ARIPiprazole  ER (ABILIFY  MAINTENA) 400 MG PRSY prefilled syringe Inject 400 mg into the muscle once for 1 dose. 02/24/24 02/24/24  Plovsky, Elna, MD  carvedilol  (COREG ) 6.25 MG tablet Take 1 tablet (6.25 mg total) by mouth 2 (two) times daily with a meal. 01/13/24   Delbert Clam, MD  isosorbide  mononitrate (IMDUR ) 30 MG 24 hr tablet Take 1 tablet (30 mg total) by mouth daily. 10/25/23   [name], MD  OVER THE COUNTER MEDICATION Take 1 capsule by mouth daily. Lion[employer]ane for memory    [provider]  rosuvastatin  (CRESTOR ) 5 MG tablet Take 1 tablet (5 mg total) by mouth daily. 03/24/23     sucroferric oxyhydroxide (VELPHORO ) 500 MG chewable tablet Chew 3 tablets (1,500 mg total) by mouth 3 (three) times daily with meals. Patient not taking: Reported on 02/24/2024 07/29/23   Tawkaliyar, Roya, DO    Allergies: Penicillin g    Review of Systems  Constitutional:  Negative for fatigue and fever.  HENT:  Negative for congestion and rhinorrhea.   Respiratory:  Positive for shortness of breath. Negative for cough.   Cardiovascular:  Negative for  chest pain and leg swelling.  Gastrointestinal:  Negative for abdominal pain, nausea and vomiting.  Skin:  Negative for wound.  Neurological:  Negative for headaches.    Updated Vital Signs BP (!) 140/88 (BP Location: Right Arm)   Pulse 82   Temp 97.8 F (36.6 C)   Resp 16   SpO2 96%   Physical Exam Constitutional:      Appearance: He is well-developed.  HENT:     Head: Normocephalic and atraumatic.  Eyes:     Pupils: Pupils are equal, round, and reactive to light.  Cardiovascular:     Rate and Rhythm: Normal rate and regular rhythm.     Heart sounds: Normal heart sounds.  Pulmonary:     Effort: Pulmonary effort is normal. No respiratory distress.     Breath sounds: Rales present. No wheezing.     Comments: Few crackles in the bases, otherwise clear Chest:     Chest wall: No tenderness.  Abdominal:     General: Bowel sounds are normal.     Palpations: Abdomen is soft.     Tenderness: There is no abdominal tenderness. There is no guarding or rebound.  Musculoskeletal:        General: Normal range of motion.     Cervical back: Normal range of motion and neck supple.     Comments: No edema or calf tenderness  Lymphadenopathy:  Cervical: No cervical adenopathy.  Skin:    General: Skin is warm and dry.     Findings: No rash.  Neurological:     Mental Status: He is alert and oriented to person, place, and time.     (all labs ordered are listed, but only abnormal results are displayed) Labs Reviewed - No data to display  EKG: None  Radiology: No results found.   Procedures   Medications Ordered in the ED - No data to display                                  Medical Decision Making Risk Decision regarding hospitalization.   This patient presents to the ED for concern of needs dialysis, this involves an extensive number of treatment options, and is a complaint that carries with it a high risk of complications and morbidity.  I considered the following  differential and admission for this acute, potentially life threatening condition.  The differential diagnosis includes fluid overload, hyperkalemia, URI, pneumonia  MDM:    Patient is a 58 year old who presents for dialysis.  His last dialysis was 3 days ago.  He says he has a little bit of shortness of breath which is not atypical for him.  He does not have any hypoxia or increased work of breathing.  His lungs show some few crackles but otherwise clear.  No other signs of fluid overload.  No fevers or other symptoms that would be more concerning for pneumonia or viral URI.  Discussed with Dr. Jerrye with nephrology who will arrange dialysis for the patient.  Will likely be discharged following this.  (Labs, imaging, consults)  Labs: I Ordered, and personally interpreted labs.  The pertinent results include:  none  Imaging Studies ordered: I ordered imaging studies including none I independently visualized and interpreted imaging. I agree with the radiologist interpretation  Additional history obtained from chart.  External records from outside source obtained and reviewed including prior notes  Cardiac Monitoring: The patient was not maintained on a cardiac monitor.  If on the cardiac monitor, I personally viewed and interpreted the cardiac monitored which showed an underlying rhythm of:    Reevaluation: After the interventions noted above, I reevaluated the patient and found that they have :stayed the same  Social Determinants of Health: psychiatric dz  Disposition:  transfer to dialysis unit.  Will likely be discharged following this.  Co morbidities that complicate the patient evaluation  Past Medical History:  Diagnosis Date   Bipolar disorder (HCC)    Central retinal artery occlusion    with macular infarction of the right eye. status post posterior vitrectomy and photocoagulation with insertion of a shunt in 2006.   CHF (congestive heart failure) (HCC)    chronic mixed  syst/diast. 02/2009 echo: Systolic function was mildly reduced. The estimated ejection fraction was in 45%, global HK, Grade I Diast dysfxn.   Chronic kidney disease    ESRD   Heart failure    Obesity    Osteoarthritis    s/p right hip replacement in 2001   Schizophrenia (HCC)    Severe uncontrolled hypertension      Medicines No orders of the defined types were placed in this encounter.   I have reviewed the patients home medicines and have made adjustments as needed  Problem List / ED Course: Problem List Items Addressed This Visit   None Visit Diagnoses  ESRD (end stage renal disease) (HCC)    -  Primary                Final diagnoses:  ESRD (end stage renal disease) [facility])    ED Discharge Orders     None          Lenor Hollering, MD 03/08/24 [phone]

## 2024-03-08 NOTE — ED Notes (Signed)
 Transport took Pt w/o Arboriculturist or NT.  ED staff unable to get updated vitals prior to transfer.

## 2024-03-09 LAB — HEPATITIS B SURFACE ANTIBODY, QUANTITATIVE: Hep B S AB Quant (Post): 6204 m[IU]/mL

## 2024-03-10 ENCOUNTER — Emergency Department (HOSPITAL_COMMUNITY): Admission: EM | Admit: 2024-03-10 | Discharge: 2024-03-10 | Discharge status: Against Medical Advice

## 2024-03-10 ENCOUNTER — Other Ambulatory Visit: Payer: Self-pay

## 2024-03-10 ENCOUNTER — Other Ambulatory Visit (HOSPITAL_COMMUNITY): Payer: Self-pay

## 2024-03-10 DIAGNOSIS — N186 End stage renal disease: Secondary | ICD-10-CM | POA: Diagnosis not present

## 2024-03-10 DIAGNOSIS — Z992 Dependence on renal dialysis: Secondary | ICD-10-CM | POA: Diagnosis not present

## 2024-03-10 DIAGNOSIS — D631 Anemia in chronic kidney disease: Secondary | ICD-10-CM | POA: Diagnosis not present

## 2024-03-10 DIAGNOSIS — Z5329 Procedure and treatment not carried out because of patient's decision for other reasons: Secondary | ICD-10-CM | POA: Insufficient documentation

## 2024-03-10 DIAGNOSIS — I120 Hypertensive chronic kidney disease with stage 5 chronic kidney disease or end stage renal disease: Secondary | ICD-10-CM | POA: Diagnosis not present

## 2024-03-10 LAB — RENAL FUNCTION PANEL
Albumin: 3.6 g/dL (ref 3.5–5.0)
Anion gap: 11 (ref 5–15)
BUN: 58 mg/dL — ABNORMAL HIGH (ref 6–20)
CO2: 25 mmol/L (ref 22–32)
Calcium: 7.4 mg/dL — ABNORMAL LOW (ref 8.9–10.3)
Chloride: 102 mmol/L (ref 98–111)
Creatinine, Ser: 11.27 mg/dL — ABNORMAL HIGH (ref 0.61–1.24)
GFR, Estimated: 5 mL/min — ABNORMAL LOW (ref >60)
Glucose, Bld: 84 mg/dL (ref 70–99)
Phosphorus: 9 mg/dL — ABNORMAL HIGH (ref 2.5–4.6)
Potassium: 4.7 mmol/L (ref 3.5–5.1)
Sodium: 138 mmol/L (ref 135–145)

## 2024-03-10 LAB — CBC
HCT: 29 % — ABNORMAL LOW (ref 39.0–52.0)
Hemoglobin: 9.2 g/dL — ABNORMAL LOW (ref 13.0–17.0)
MCH: 30.1 pg (ref 26.0–34.0)
MCHC: 31.7 g/dL (ref 30.0–36.0)
MCV: 94.8 fL (ref 80.0–100.0)
Platelets: 217 K/uL (ref 150–400)
RBC: 3.06 MIL/uL — ABNORMAL LOW (ref 4.22–5.81)
RDW: 18.2 % — ABNORMAL HIGH (ref 11.5–15.5)
WBC: 6.8 K/uL (ref 4.0–10.5)
nRBC: 0 % (ref 0.0–0.2)

## 2024-03-10 LAB — HEPATITIS B SURFACE ANTIGEN: Hepatitis B Surface Ag: NONREACTIVE

## 2024-03-10 MED ORDER — LIDOCAINE HCL (PF) 1 % IJ SOLN
5.0000 mL | INTRAMUSCULAR | Status: DC | PRN
Start: 2024-03-10 — End: 2024-03-11

## 2024-03-10 MED ORDER — NEPRO/CARBSTEADY PO LIQD
237.0000 mL | ORAL | Status: DC | PRN
Start: 2024-03-10 — End: 2024-03-11

## 2024-03-10 MED ORDER — LIDOCAINE-PRILOCAINE 2.5-2.5 % EX CREA: 1.0000 | TOPICAL_CREAM | CUTANEOUS | Status: DC | PRN

## 2024-03-10 MED ORDER — ALTEPLASE 2 MG IJ SOLR: 2.0000 mg | Freq: Once | INTRAMUSCULAR | Status: DC | PRN

## 2024-03-10 MED ORDER — HEPARIN SODIUM (PORCINE) 1000 UNIT/ML DIALYSIS
2400.0000 [IU] | Freq: Once | INTRAMUSCULAR | Status: DC
  Filled 2024-03-10: qty 3

## 2024-03-10 MED ORDER — ANTICOAGULANT SODIUM CITRATE 4% (200MG/5ML) IV SOLN
5.0000 mL | Status: DC | PRN
Start: 2024-03-10 — End: 2024-03-11

## 2024-03-10 MED ORDER — CHLORHEXIDINE GLUCONATE CLOTH 2 % EX PADS: 6.0000 | MEDICATED_PAD | Freq: Every day | CUTANEOUS | Status: DC

## 2024-03-10 MED ORDER — HEPARIN SODIUM (PORCINE) 1000 UNIT/ML DIALYSIS: 1000.0000 [IU] | INTRAMUSCULAR | Status: DC | PRN

## 2024-03-10 MED ORDER — DARBEPOETIN ALFA 150 MCG/0.3ML IJ SOSY
150.0000 ug | PREFILLED_SYRINGE | Freq: Once | INTRAMUSCULAR | Status: AC
  Administered 2024-03-10: 150 ug via SUBCUTANEOUS
  Filled 2024-03-10: qty 0.3

## 2024-03-10 MED ORDER — LIDOCAINE HCL (PF) 1 % IJ SOLN: 5.0000 mL | INTRAMUSCULAR | Status: DC | PRN

## 2024-03-10 MED ORDER — PENTAFLUOROPROP-TETRAFLUOROETH EX AERO: 1.0000 | INHALATION_SPRAY | CUTANEOUS | Status: DC | PRN

## 2024-03-10 MED ORDER — ALTEPLASE 2 MG IJ SOLR
2.0000 mg | Freq: Once | INTRAMUSCULAR | Status: DC | PRN
Start: 2024-03-10 — End: 2024-03-11

## 2024-03-10 MED ORDER — CALCIUM GLUCONATE-NACL 1-0.675 GM/50ML-% IV SOLN
1.0000 g | Freq: Once | INTRAVENOUS | Status: AC
  Administered 2024-03-10: 1000 mg via INTRAVENOUS
  Filled 2024-03-10: qty 50

## 2024-03-10 NOTE — ED Triage Notes (Signed)
 Pt. Here for dialysis . Completed full treatment on Wednesday

## 2024-03-10 NOTE — ED Provider Notes (Signed)
 [place] EMERGENCY DEPARTMENT AT [facility] Provider Note   CSN: [id number] Arrival date & time: 03/10/24  0700     Patient presents with: Vascular Access Problem   Paul [name] is a 58 y.o. male.   This is a 58 year old male ESRD on dialysis presenting to the emergency department for his routine dialysis.  He last had full session on Monday.  He has no complaints currently.  No chest pain or shortness of breath        Prior to Admission medications   Medication Sig Start Date End Date Taking? Authorizing Provider  ARIPiprazole  ER (ABILIFY  MAINTENA) 400 MG PRSY prefilled syringe Inject 400 mg into the muscle every 28 (twenty-eight) days. 02/24/24   Plovsky, Elna, MD  ARIPiprazole  ER (ABILIFY  MAINTENA) 400 MG PRSY prefilled syringe Inject 400 mg into the muscle once for 1 dose. 02/24/24 02/24/24  Plovsky, Elna, MD  carvedilol  (COREG ) 6.25 MG tablet Take 1 tablet (6.25 mg total) by mouth 2 (two) times daily with a meal. 01/13/24   Delbert Clam, MD  isosorbide  mononitrate (IMDUR ) 30 MG 24 hr tablet Take 1 tablet (30 mg total) by mouth daily. 10/25/23   [name], MD  OVER THE COUNTER MEDICATION Take 1 capsule by mouth daily. [employer]ane for memory    [provider]  rosuvastatin  (CRESTOR ) 5 MG tablet Take 1 tablet (5 mg total) by mouth daily. 03/24/23     sucroferric oxyhydroxide (VELPHORO ) 500 MG chewable tablet Chew 3 tablets (1,500 mg total) by mouth 3 (three) times daily with meals. Patient not taking: Reported on 02/24/2024 07/29/23   Tawkaliyar, Roya, DO    Allergies: Penicillin g    Review of Systems  Updated Vital Signs BP 130/80 (BP Location: Left Arm)   Pulse 80   Temp 98 F (36.7 C) (Oral)   Resp 18   Wt 83.2 kg   SpO2 100%   BMI 32.49 kg/m   Physical Exam Vitals and nursing note reviewed.  HENT:     Head: Normocephalic.     Nose: Nose normal.     Mouth/Throat:     Mouth: Mucous membranes are moist.  Eyes:      Conjunctiva/sclera: Conjunctivae normal.  Cardiovascular:     Rate and Rhythm: Normal rate.  Pulmonary:     Effort: Pulmonary effort is normal.  Abdominal:     General: There is no distension.  Skin:    General: Skin is warm and dry.     Capillary Refill: Capillary refill takes less than 2 seconds.  Neurological:     Mental Status: He is alert and oriented to person, place, and time.  Psychiatric:        Mood and Affect: Mood normal.        Behavior: Behavior normal.     (all labs ordered are listed, but only abnormal results are displayed) Labs Reviewed  RENAL FUNCTION PANEL - Abnormal; Notable for the following components:      Result Value   BUN 58 (*)    Creatinine, Ser 11.27 (*)    Calcium  7.4 (*)    Phosphorus 9.0 (*)    GFR, Estimated 5 (*)    All other components within normal limits  CBC - Abnormal; Notable for the following components:   RBC 3.06 (*)    Hemoglobin 9.2 (*)    HCT 29.0 (*)    RDW 18.2 (*)    All other components within normal limits  HEPATITIS B  SURFACE ANTIGEN  HEPATITIS B SURFACE ANTIBODY, QUANTITATIVE  CBC  RENAL FUNCTION PANEL  RENAL FUNCTION PANEL    EKG: None  Radiology: No results found.   Procedures   Medications Ordered in the ED  Chlorhexidine  Gluconate Cloth 2 % PADS 6 each (has no administration in time range)  pentafluoroprop-tetrafluoroeth (GEBAUERS) aerosol 1 Application (has no administration in time range)  lidocaine  (PF) (XYLOCAINE ) 1 % injection 5 mL (has no administration in time range)  lidocaine -prilocaine  (EMLA ) cream 1 Application (has no administration in time range)  heparin  injection 1,000 Units (has no administration in time range)  anticoagulant sodium citrate  solution 5 mL (has no administration in time range)  alteplase  (CATHFLO ACTIVASE ) injection 2 mg (has no administration in time range)  pentafluoroprop-tetrafluoroeth (GEBAUERS) aerosol 1 Application (has no administration in time range)  lidocaine   (PF) (XYLOCAINE ) 1 % injection 5 mL (has no administration in time range)  lidocaine -prilocaine  (EMLA ) cream 1 Application (has no administration in time range)  feeding supplement (NEPRO CARB STEADY) liquid 237 mL (has no administration in time range)  heparin  injection 1,000 Units (has no administration in time range)  anticoagulant sodium citrate  solution 5 mL (has no administration in time range)  alteplase  (CATHFLO ACTIVASE ) injection 2 mg (has no administration in time range)  heparin  injection 2,400 Units (has no administration in time range)  pentafluoroprop-tetrafluoroeth (GEBAUERS) aerosol 1 Application (has no administration in time range)  lidocaine  (PF) (XYLOCAINE ) 1 % injection 5 mL (has no administration in time range)  lidocaine -prilocaine  (EMLA ) cream 1 Application (has no administration in time range)  heparin  injection 1,000 Units (has no administration in time range)  anticoagulant sodium citrate  solution 5 mL (has no administration in time range)  alteplase  (CATHFLO ACTIVASE ) injection 2 mg (has no administration in time range)  Darbepoetin Alfa  (ARANESP ) injection 150 mcg (150 mcg Subcutaneous Given 03/10/24 1205)  calcium  gluconate 1 g/ 50 mL sodium chloride  IVPB (1,000 mg Intravenous New Bag/Given 03/10/24 1203)    Clinical Course as of 03/10/24 1543  Wed Mar 10, 2024  0730 Spoke with nephrology will put in orders for dialysis. [TY]  1543 Patient eloped after dialysis before I could reassess. [TY]    Clinical Course User Index [TY] [name], DO                                 Medical Decision Making Is a 58 year old male presenting emergency department for dialysis.  Past medical history includes bipolar/schizophrenia, ESRD on dialysis.  He has no physical complaints today.  Blood pressure mildly elevated, vitals otherwise reassuring.  He is not in any distress on exam.  Nephrology consulted for dialysis.  Plan to dialyze and discharge.       Final  diagnoses:  ESRD (end stage renal disease) [facility])    ED Discharge Orders     None          [name], DO 03/10/24 1543

## 2024-03-10 NOTE — Progress Notes (Signed)
 The patient has completed dialysis treatment without issue. Meds given during HD: Aranesp  150 mcg IV. Calcium  Gluconate 1 g IV.  03/10/24 1337  Vitals  Temp 98 F (36.7 C)  Temp Source Oral  BP 130/80  BP Location Left Arm  BP Method Automatic  Patient Position (if appropriate) Lying  Pulse Rate 80  Resp 18  Oxygen Therapy  SpO2 100 %  O2 Device Room Air  During Treatment Monitoring  HD Safety Checks Performed Yes  Intra-Hemodialysis Comments Tx completed  Post Treatment  Dialyzer Clearance Lightly streaked  Hemodialysis Intake (mL) 0 mL  Liters Processed 80  Fluid Removed (mL) 2400 mL  Tolerated HD Treatment Yes  Post-Hemodialysis Comments see notes.  AVG/AVF Arterial Site Held (minutes) 7 minutes  AVG/AVF Venous Site Held (minutes) 7 minutes  Fistula / Graft Right Upper arm Arteriovenous fistula  No placement date or time found.   Placed prior to admission: Yes  Orientation: Right  Access Location: Upper arm  Access Type: Arteriovenous fistula  Site Condition No complications  Fistula / Graft Assessment Present;Thrill;Bruit  Status Deaccessed  Needle Size 15  Drainage Description None

## 2024-03-11 LAB — HEPATITIS B SURFACE ANTIBODY, QUANTITATIVE: Hep B S AB Quant (Post): 6760 m[IU]/mL

## 2024-03-12 ENCOUNTER — Other Ambulatory Visit: Payer: Self-pay

## 2024-03-12 ENCOUNTER — Encounter (HOSPITAL_COMMUNITY): Payer: Self-pay | Admitting: *Deleted

## 2024-03-12 ENCOUNTER — Emergency Department (HOSPITAL_COMMUNITY)
Admission: EM | Admit: 2024-03-12 | Discharge: 2024-03-13 | Disposition: A | Discharge status: Home / Self Care | Attending: Emergency Medicine | Admitting: Emergency Medicine

## 2024-03-12 DIAGNOSIS — Z79899 Other long term (current) drug therapy: Secondary | ICD-10-CM | POA: Insufficient documentation

## 2024-03-12 DIAGNOSIS — Z992 Dependence on renal dialysis: Secondary | ICD-10-CM | POA: Insufficient documentation

## 2024-03-12 DIAGNOSIS — I509 Heart failure, unspecified: Secondary | ICD-10-CM | POA: Insufficient documentation

## 2024-03-12 DIAGNOSIS — I132 Hypertensive heart and chronic kidney disease with heart failure and with stage 5 chronic kidney disease, or end stage renal disease: Secondary | ICD-10-CM | POA: Diagnosis not present

## 2024-03-12 DIAGNOSIS — N186 End stage renal disease: Secondary | ICD-10-CM | POA: Diagnosis not present

## 2024-03-12 DIAGNOSIS — I120 Hypertensive chronic kidney disease with stage 5 chronic kidney disease or end stage renal disease: Secondary | ICD-10-CM | POA: Diagnosis not present

## 2024-03-12 LAB — BASIC METABOLIC PANEL WITH GFR
Anion gap: 17 — ABNORMAL HIGH (ref 5–15)
BUN: 58 mg/dL — ABNORMAL HIGH (ref 6–20)
CO2: 21 mmol/L — ABNORMAL LOW (ref 22–32)
Calcium: 7.4 mg/dL — ABNORMAL LOW (ref 8.9–10.3)
Chloride: 100 mmol/L (ref 98–111)
Creatinine, Ser: 11.11 mg/dL — ABNORMAL HIGH (ref 0.61–1.24)
GFR, Estimated: 5 mL/min — ABNORMAL LOW (ref >60)
Glucose, Bld: 88 mg/dL (ref 70–99)
Potassium: 4.5 mmol/L (ref 3.5–5.1)
Sodium: 138 mmol/L (ref 135–145)

## 2024-03-12 LAB — CBC WITH DIFFERENTIAL/PLATELET
Abs Immature Granulocytes: 0.05 K/uL (ref 0.00–0.07)
Basophils Absolute: 0 K/uL (ref 0.0–0.1)
Basophils Relative: 1 %
Eosinophils Absolute: 0.4 K/uL (ref 0.0–0.5)
Eosinophils Relative: 6 %
HCT: 27 % — ABNORMAL LOW (ref 39.0–52.0)
Hemoglobin: 9.1 g/dL — ABNORMAL LOW (ref 13.0–17.0)
Immature Granulocytes: 1 %
Lymphocytes Relative: 21 %
Lymphs Abs: 1.4 K/uL (ref 0.7–4.0)
MCH: 30.7 pg (ref 26.0–34.0)
MCHC: 33.7 g/dL (ref 30.0–36.0)
MCV: 91.2 fL (ref 80.0–100.0)
Monocytes Absolute: 0.7 K/uL (ref 0.1–1.0)
Monocytes Relative: 11 %
Neutro Abs: 4.2 K/uL (ref 1.7–7.7)
Neutrophils Relative %: 60 %
Platelets: 204 K/uL (ref 150–400)
RBC: 2.96 MIL/uL — ABNORMAL LOW (ref 4.22–5.81)
RDW: 18.2 % — ABNORMAL HIGH (ref 11.5–15.5)
WBC: 6.8 K/uL (ref 4.0–10.5)
nRBC: 0.3 % — ABNORMAL HIGH (ref 0.0–0.2)

## 2024-03-12 MED ORDER — LIDOCAINE HCL (PF) 1 % IJ SOLN: 5.0000 mL | INTRAMUSCULAR | Status: DC | PRN

## 2024-03-12 MED ORDER — PENTAFLUOROPROP-TETRAFLUOROETH EX AERO: 1.0000 | INHALATION_SPRAY | CUTANEOUS | Status: DC | PRN

## 2024-03-12 MED ORDER — ALTEPLASE 2 MG IJ SOLR: 2.0000 mg | Freq: Once | INTRAMUSCULAR | Status: DC | PRN

## 2024-03-12 MED ORDER — HEPARIN SODIUM (PORCINE) 1000 UNIT/ML DIALYSIS: 1000.0000 [IU] | INTRAMUSCULAR | Status: DC | PRN

## 2024-03-12 MED ORDER — ANTICOAGULANT SODIUM CITRATE 4% (200MG/5ML) IV SOLN: 5.0000 mL | Status: DC | PRN

## 2024-03-12 MED ORDER — CHLORHEXIDINE GLUCONATE CLOTH 2 % EX PADS: 6.0000 | MEDICATED_PAD | Freq: Every day | CUTANEOUS | Status: DC

## 2024-03-12 MED ORDER — LIDOCAINE-PRILOCAINE 2.5-2.5 % EX CREA: 1.0000 | TOPICAL_CREAM | CUTANEOUS | Status: DC | PRN

## 2024-03-12 NOTE — ED Triage Notes (Signed)
 No complaints , patient is here for his dialysis

## 2024-03-12 NOTE — Progress Notes (Signed)
   03/12/24 1756  Vitals  Temp 98.3 F (36.8 C)  Pulse Rate 75  Resp 15  BP 117/65  SpO2 98 %  O2 Device Room Air  Weight 83.9 kg  Oxygen Therapy  Patient Activity (if Appropriate) In bed  Pulse Oximetry Type Continuous  Post Treatment  Dialyzer Clearance Lightly streaked  Liters Processed 84  Fluid Removed (mL) 2500 mL  Tolerated HD Treatment Yes  Post-Hemodialysis Comments Pt. tolerated tx well

## 2024-03-12 NOTE — Discharge Instructions (Addendum)
 It was a pleasure caring for you today in the emergency department.  Please return to the emergency department for any worsening or worrisome symptoms.
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
## 2024-03-12 NOTE — Progress Notes (Signed)
 Received report from the floor nurse. Pt. Came in with stretcher, assisted by porter. Pt. Awake and responsive with no complaints. Consent signed and on file.  Patient started with no complaints.  Tx duration: 3.5 hours UF goal:  Access used: Right AVF Access issue: None  Pt. Completed and tolerated tx. Pressure dressing applied. Endorsed to floor nurse. Transported pt. Back to ED.   Paul Blaustein Rubi Story Vanvranken, RN Kidney Dialysis Unit

## 2024-03-15 ENCOUNTER — Other Ambulatory Visit: Payer: Self-pay

## 2024-03-15 ENCOUNTER — Emergency Department (HOSPITAL_COMMUNITY)
Admission: EM | Admit: 2024-03-15 | Discharge: 2024-03-15 | Discharge status: Against Medical Advice | Attending: Emergency Medicine | Admitting: Emergency Medicine

## 2024-03-15 ENCOUNTER — Encounter (HOSPITAL_COMMUNITY): Payer: Self-pay

## 2024-03-15 DIAGNOSIS — I132 Hypertensive heart and chronic kidney disease with heart failure and with stage 5 chronic kidney disease, or end stage renal disease: Secondary | ICD-10-CM | POA: Insufficient documentation

## 2024-03-15 DIAGNOSIS — I509 Heart failure, unspecified: Secondary | ICD-10-CM | POA: Insufficient documentation

## 2024-03-15 DIAGNOSIS — I120 Hypertensive chronic kidney disease with stage 5 chronic kidney disease or end stage renal disease: Secondary | ICD-10-CM | POA: Diagnosis not present

## 2024-03-15 DIAGNOSIS — N186 End stage renal disease: Secondary | ICD-10-CM | POA: Diagnosis not present

## 2024-03-15 DIAGNOSIS — Z5329 Procedure and treatment not carried out because of patient's decision for other reasons: Secondary | ICD-10-CM | POA: Diagnosis not present

## 2024-03-15 DIAGNOSIS — Z992 Dependence on renal dialysis: Secondary | ICD-10-CM | POA: Diagnosis not present

## 2024-03-15 LAB — RENAL FUNCTION PANEL
Albumin: 3.4 g/dL — ABNORMAL LOW (ref 3.5–5.0)
Anion gap: 22 — ABNORMAL HIGH (ref 5–15)
BUN: 97 mg/dL — ABNORMAL HIGH (ref 6–20)
CO2: 18 mmol/L — ABNORMAL LOW (ref 22–32)
Calcium: 7.5 mg/dL — ABNORMAL LOW (ref 8.9–10.3)
Chloride: 96 mmol/L — ABNORMAL LOW (ref 98–111)
Creatinine, Ser: 12.97 mg/dL — ABNORMAL HIGH (ref 0.61–1.24)
GFR, Estimated: 4 mL/min — ABNORMAL LOW (ref >60)
Glucose, Bld: 98 mg/dL (ref 70–99)
Phosphorus: 10.3 mg/dL — ABNORMAL HIGH (ref 2.5–4.6)
Potassium: 5.4 mmol/L — ABNORMAL HIGH (ref 3.5–5.1)
Sodium: 136 mmol/L (ref 135–145)

## 2024-03-15 LAB — CBC WITH DIFFERENTIAL/PLATELET
Abs Immature Granulocytes: 0.02 K/uL (ref 0.00–0.07)
Basophils Absolute: 0 K/uL (ref 0.0–0.1)
Basophils Relative: 1 %
Eosinophils Absolute: 0.3 K/uL (ref 0.0–0.5)
Eosinophils Relative: 5 %
HCT: 26.8 % — ABNORMAL LOW (ref 39.0–52.0)
Hemoglobin: 9 g/dL — ABNORMAL LOW (ref 13.0–17.0)
Immature Granulocytes: 0 %
Lymphocytes Relative: 19 %
Lymphs Abs: 1.1 K/uL (ref 0.7–4.0)
MCH: 30.2 pg (ref 26.0–34.0)
MCHC: 33.6 g/dL (ref 30.0–36.0)
MCV: 89.9 fL (ref 80.0–100.0)
Monocytes Absolute: 0.6 K/uL (ref 0.1–1.0)
Monocytes Relative: 11 %
Neutro Abs: 3.9 K/uL (ref 1.7–7.7)
Neutrophils Relative %: 64 %
Platelets: 209 K/uL (ref 150–400)
RBC: 2.98 MIL/uL — ABNORMAL LOW (ref 4.22–5.81)
RDW: 18.4 % — ABNORMAL HIGH (ref 11.5–15.5)
WBC: 6 K/uL (ref 4.0–10.5)
nRBC: 0 % (ref 0.0–0.2)

## 2024-03-15 MED ORDER — PENTAFLUOROPROP-TETRAFLUOROETH EX AERO: 1.0000 | INHALATION_SPRAY | CUTANEOUS | Status: DC | PRN

## 2024-03-15 MED ORDER — HEPARIN SODIUM (PORCINE) 1000 UNIT/ML IJ SOLN
INTRAMUSCULAR | Status: AC
  Filled 2024-03-15: qty 6

## 2024-03-15 MED ORDER — LIDOCAINE HCL (PF) 1 % IJ SOLN: 5.0000 mL | INTRAMUSCULAR | Status: DC | PRN

## 2024-03-15 MED ORDER — LIDOCAINE-PRILOCAINE 2.5-2.5 % EX CREA
1.0000 | TOPICAL_CREAM | CUTANEOUS | Status: DC | PRN
Start: 2024-03-15 — End: 2024-03-15

## 2024-03-15 MED ORDER — HEPARIN SODIUM (PORCINE) 1000 UNIT/ML DIALYSIS
1000.0000 [IU] | INTRAMUSCULAR | Status: DC | PRN
Start: 2024-03-15 — End: 2024-03-15

## 2024-03-15 MED ORDER — NEPRO/CARBSTEADY PO LIQD: 237.0000 mL | ORAL | Status: DC | PRN

## 2024-03-15 MED ORDER — HEPARIN SODIUM (PORCINE) 1000 UNIT/ML DIALYSIS
2400.0000 [IU] | Freq: Once | INTRAMUSCULAR | Status: AC
  Administered 2024-03-15: 2400 [IU] via INTRAVENOUS_CENTRAL

## 2024-03-15 MED ORDER — CHLORHEXIDINE GLUCONATE CLOTH 2 % EX PADS
6.0000 | MEDICATED_PAD | Freq: Every day | CUTANEOUS | Status: DC
  Administered 2024-03-15: 6 via TOPICAL

## 2024-03-15 MED ORDER — HEPARIN SODIUM (PORCINE) 1000 UNIT/ML DIALYSIS: 1000.0000 [IU] | INTRAMUSCULAR | Status: DC | PRN

## 2024-03-15 MED ORDER — ALTEPLASE 2 MG IJ SOLR: 2.0000 mg | Freq: Once | INTRAMUSCULAR | Status: DC | PRN

## 2024-03-15 MED ORDER — ANTICOAGULANT SODIUM CITRATE 4% (200MG/5ML) IV SOLN: 5.0000 mL | Status: DC | PRN

## 2024-03-15 NOTE — Procedures (Deleted)
 Asked to see this patient for hospital dialysis. Pt was discharged from his outpatient unit for behavior issues. The plan will be for ED HD. Pt is not to be admitted at this time. Pt will go to the dialysis unit when they are ready for the patient. When dialysis is completed pt will be sent back to ED for reassessment.   Vitals:   03/15/24 0708 03/15/24 0718  BP: (!) 193/95   Pulse: 78   Resp: 18   Temp: (!) 97.5 F (36.4 C)   SpO2: 95%   Weight:  84 kg  Height:  5' 3 (1.6 m)   Last OP HD unit info from 07/2023 -->  4h  B400 76.5kg  2K bath RUA AVF Heparin  2400    Clinical issues other than volume/ K+: 1) Anemia of esrd:  - pt dose not have a nephrologist or HD unit so does not have access to ESA or IV iron , other than when here for hospital HD sessions - last tsat 24% on 12/12/23             - got IV venofer  200mg  10/05  - get another tsat today   - recent Hb's around 9- 10 - last 3 doses of darbepoeitin here -->   - 200 mcg 10/15 - 150 mcg 10/29 - 150 mcg 11/05 - due for esa later this week or next week   I was present at the procedure, reviewed the HD regimen and made appropriate changes.   Myer Fret MD  [facility] 03/15/2024, 8:06 AM    Recent Labs  Lab 03/08/24 0934 03/10/24 0925 03/12/24 1014  HGB 8.7* 9.2* 9.1*  ALBUMIN 3.4* 3.6  --   CALCIUM  7.4* 7.4* 7.4*  PHOS 9.4* 9.0*  --   CREATININE 12.59* 11.27* 11.11*  K 4.6 4.7 4.5   No results for input(s): IRON , TIBC, FERRITIN in the last 168 hours. Inpatient medications:  ARIPiprazole  ER  400 mg Intramuscular Q28 days

## 2024-03-15 NOTE — Progress Notes (Signed)
 1.5 liters ultrafiltration, condition stable, and report was given to the ED charge RN.  This patient was then transported to hall bed 13 in stable condition via stretcher.

## 2024-03-15 NOTE — ED Provider Notes (Signed)
 [place] [facility] AT [facility] Provider Note   CSN: [id number] Arrival date & time: 03/15/24  9294     Patient presents with: No chief complaint on file.   Paul [name] is a 58 y.o. male.   Pt is a 58 yo male with pmhx significant for HTN, CRAO, CHF, Bipolar d/o, schizophrenia, and ESRD on HD.  Pt presents to the ED requesting dialysis.  He feels ok.  His last dialysis was on 11/7.         Prior to Admission medications   Medication Sig Start Date End Date Taking? Authorizing Provider  ARIPiprazole  ER (ABILIFY  MAINTENA) 400 MG PRSY prefilled syringe Inject 400 mg into the muscle every 28 (twenty-eight) days. 02/24/24   Plovsky, Elna, MD  ARIPiprazole  ER (ABILIFY  MAINTENA) 400 MG PRSY prefilled syringe Inject 400 mg into the muscle once for 1 dose. 02/24/24 02/24/24  Plovsky, Elna, MD  carvedilol  (COREG ) 6.25 MG tablet Take 1 tablet (6.25 mg total) by mouth 2 (two) times daily with a meal. 01/13/24   Delbert Clam, MD  isosorbide  mononitrate (IMDUR ) 30 MG 24 hr tablet Take 1 tablet (30 mg total) by mouth daily. 10/25/23   [name], MD  OVER THE COUNTER MEDICATION Take 1 capsule by mouth daily. Lion[employer] Mane for memory    [provider]  rosuvastatin  (CRESTOR ) 5 MG tablet Take 1 tablet (5 mg total) by mouth daily. 03/24/23     sucroferric oxyhydroxide (VELPHORO ) 500 MG chewable tablet Chew 3 tablets (1,500 mg total) by mouth 3 (three) times daily with meals. Patient not taking: Reported on 02/24/2024 07/29/23   Tawkaliyar, Roya, DO    Allergies: Penicillin g    Review of Systems  All other systems reviewed and are negative.   Updated Vital Signs BP (!) 193/95 (BP Location: Left Arm)   Pulse 78   Temp (!) 97.5 F (36.4 C)   Resp 18   Ht 5' 3 (1.6 m)   Wt 84 kg   SpO2 95%   BMI 32.80 kg/m   Physical Exam Vitals and nursing note reviewed.  Constitutional:      Appearance: Normal appearance.  HENT:     Head:  Normocephalic and atraumatic.     Right Ear: External ear normal.     Left Ear: External ear normal.     Nose: Nose normal.     Mouth/Throat:     Mouth: Mucous membranes are moist.     Pharynx: Oropharynx is clear.  Eyes:     Comments: Right eye blind  Cardiovascular:     Rate and Rhythm: Normal rate and regular rhythm.     Pulses: Normal pulses.     Heart sounds: Normal heart sounds.  Pulmonary:     Effort: Pulmonary effort is normal.     Breath sounds: Normal breath sounds.  Abdominal:     General: Abdomen is flat. Bowel sounds are normal.     Palpations: Abdomen is soft.  Musculoskeletal:        General: Normal range of motion.     Cervical back: Normal range of motion and neck supple.  Skin:    General: Skin is warm.     Capillary Refill: Capillary refill takes less than 2 seconds.  Neurological:     General: No focal deficit present.     Mental Status: He is alert and oriented to person, place, and time.  Psychiatric:        Mood and  Affect: Mood normal.        Behavior: Behavior normal.     (all labs ordered are listed, but only abnormal results are displayed) Labs Reviewed - No data to display  EKG: None  Radiology: No results found.   Procedures   Medications Ordered in the ED  Chlorhexidine  Gluconate Cloth 2 % PADS 6 each (has no administration in time range)                                    Medical Decision Making  This patient presents to the ED for concern of needing dialysis, this involves an extensive number of treatment options, and is a complaint that carries with it a high risk of complications and morbidity.  The differential diagnosis includes dialysis   Co morbidities that complicate the patient evaluation  HTN, CRAO, CHF, Bipolar d/o, schizophrenia, and ESRD on HD   Additional history obtained:  Additional history obtained from epic chart review    Medicines ordered and prescription drug management:   I have reviewed the  patients home medicines and have made adjustments as needed   Critical Interventions:  dialysis   Consultations Obtained:  I requested consultation with the nephrologist (Dr. Geralynn),  and discussed lab and imaging findings as well as pertinent plan -he will put him on the list for dialysis   Problem List / ED Course:  ESRD on HD:  pt will need dialysis today   Reevaluation:  After the interventions noted above, I reevaluated the patient and found that they have :improved   Social Determinants of Health:  Lives at home   Dispostion:  Likely d/c after dialysis     Final diagnoses:  ESRD on hemodialysis [facility])    ED Discharge Orders     None          Dean Clarity, MD 03/15/24 [phone]

## 2024-03-15 NOTE — Procedures (Signed)
 Asked to see this patient for hospital dialysis. Pt was discharged from his outpatient unit for behavior issues. The plan will be for ED HD. Pt is not to be admitted at this time. Pt will go to the dialysis unit when they are ready for the patient. When dialysis is completed pt will be sent back to ED for reassessment.        Vitals:    03/15/24 0708 03/15/24 0718  BP: (!) 193/95    Pulse: 78    Resp: 18    Temp: (!) 97.5 F (36.4 C)    SpO2: 95%    Weight:   84 kg  Height:   5' 3 (1.6 m)    Last OP HD unit info from 07/2023 -->  4h  B400 76.5kg  2K bath RUA AVF Heparin  2400    Clinical issues other than volume/ K+: 1) Anemia of esrd:  - pt dose not have a nephrologist or HD unit so does not have access to ESA or IV iron , other than when here for hospital HD sessions - last tsat 24% on 12/12/23             - got IV venofer  200mg  10/05             - get another tsat today   - recent Hb's around 9- 10 - last 3 doses of darbepoeitin here -->   - 200 mcg 10/15 - 150 mcg 10/29 - 150 mcg 11/05 - due for esa later this week or next week   I was present at the procedure, reviewed the HD regimen and made appropriate changes.   Myer Fret MD  [facility] 03/15/2024, 8:06 AM

## 2024-03-15 NOTE — ED Triage Notes (Signed)
Pt here for Dialysis.

## 2024-03-17 ENCOUNTER — Emergency Department (HOSPITAL_COMMUNITY): Admission: EM | Admit: 2024-03-17 | Discharge: 2024-03-18 | Disposition: A | Discharge status: Home / Self Care

## 2024-03-17 ENCOUNTER — Other Ambulatory Visit: Payer: Self-pay

## 2024-03-17 DIAGNOSIS — I120 Hypertensive chronic kidney disease with stage 5 chronic kidney disease or end stage renal disease: Secondary | ICD-10-CM | POA: Diagnosis not present

## 2024-03-17 DIAGNOSIS — Z992 Dependence on renal dialysis: Secondary | ICD-10-CM | POA: Insufficient documentation

## 2024-03-17 DIAGNOSIS — Z5329 Procedure and treatment not carried out because of patient's decision for other reasons: Secondary | ICD-10-CM | POA: Insufficient documentation

## 2024-03-17 DIAGNOSIS — N186 End stage renal disease: Secondary | ICD-10-CM | POA: Diagnosis not present

## 2024-03-17 MED ORDER — CHLORHEXIDINE GLUCONATE CLOTH 2 % EX PADS: 6.0000 | MEDICATED_PAD | Freq: Every day | CUTANEOUS | Status: DC

## 2024-03-17 MED ORDER — HEPARIN SODIUM (PORCINE) 1000 UNIT/ML IJ SOLN
INTRAMUSCULAR | Status: AC
  Filled 2024-03-17: qty 3

## 2024-03-17 NOTE — ED Notes (Signed)
 Patient refused labs in the ED

## 2024-03-17 NOTE — Progress Notes (Signed)
 1.5 liters ultrafiltration, condition stable post hemodialysis and report was given to the ED charge RN, M. Valera.

## 2024-03-17 NOTE — Procedures (Signed)
 Asked to see this patient for hospital dialysis. Pt was discharged from his outpatient unit for behavior issues. The plan will be for ED HD. Pt is not to be admitted at this time. Pt will go to the dialysis unit when they are ready for the patient. When dialysis is completed pt will be sent back to ED for reassessment.   Vitals:   03/17/24 1330 03/17/24 1400 03/17/24 1430 03/17/24 1500  BP: (!) 154/85 (!) 154/80 (!) 144/85 134/82  Pulse: 71 64 68 68  Resp: 15 (!) 22 13 14   Temp:      TempSrc:      SpO2: 100% 99% 100% 100%  Weight:           I was present at the procedure, reviewed the HD regimen and made appropriate changes.   Myer Fret MD  [facility] 03/17/2024, 3:30 PM    Recent Labs  Lab 03/12/24 1014 03/15/24 1400  HGB 9.1* 9.0*  ALBUMIN  --  3.4*  CALCIUM  7.4* 7.5*  PHOS  --  10.3*  CREATININE 11.11* 12.97*  K 4.5 5.4*   No results for input(s): IRON , TIBC, FERRITIN in the last 168 hours. Inpatient medications:  Chlorhexidine  Gluconate Cloth  6 each Topical Q0600

## 2024-03-17 NOTE — ED Provider Notes (Signed)
 [place] EMERGENCY DEPARTMENT AT [facility] Provider Note   CSN: [id number] Arrival date & time: 03/17/24  9288     Patient presents with: Vascular Access Problem   Paul [name] is a 58 y.o. male.   58 year old male with ESRD on HD here for dialysis. Last dialysis was 03/15/2024. No other complaints at this time. Nephrology paged.         Prior to Admission medications   Medication Sig Start Date End Date Taking? Authorizing Provider  ARIPiprazole  ER (ABILIFY  MAINTENA) 400 MG PRSY prefilled syringe Inject 400 mg into the muscle every 28 (twenty-eight) days. 02/24/24   Plovsky, Elna, MD  ARIPiprazole  ER (ABILIFY  MAINTENA) 400 MG PRSY prefilled syringe Inject 400 mg into the muscle once for 1 dose. 02/24/24 02/24/24  Plovsky, Elna, MD  carvedilol  (COREG ) 6.25 MG tablet Take 1 tablet (6.25 mg total) by mouth 2 (two) times daily with a meal. 01/13/24   Delbert Clam, MD  isosorbide  mononitrate (IMDUR ) 30 MG 24 hr tablet Take 1 tablet (30 mg total) by mouth daily. 10/25/23   [name], MD  OVER THE COUNTER MEDICATION Take 1 capsule by mouth daily. Lion[employer]ane for memory    [provider]  rosuvastatin  (CRESTOR ) 5 MG tablet Take 1 tablet (5 mg total) by mouth daily. 03/24/23     sucroferric oxyhydroxide (VELPHORO ) 500 MG chewable tablet Chew 3 tablets (1,500 mg total) by mouth 3 (three) times daily with meals. Patient not taking: Reported on 02/24/2024 07/29/23   Tawkaliyar, Roya, DO    Allergies: Penicillin g    Review of Systems  Constitutional:  Negative for chills and fever.  HENT:  Negative for ear pain and sore throat.   Eyes:  Negative for pain and visual disturbance.  Respiratory:  Negative for cough and shortness of breath.   Cardiovascular:  Negative for chest pain and palpitations.  Gastrointestinal:  Negative for abdominal pain and vomiting.  Genitourinary:  Negative for dysuria and hematuria.  Musculoskeletal:  Negative for  arthralgias and back pain.  Skin:  Negative for color change and rash.  Neurological:  Negative for seizures and syncope.  All other systems reviewed and are negative.   Updated Vital Signs BP (!) 171/96 (BP Location: Left Arm)   Pulse 79   Temp 98 F (36.7 C)   Resp 17   SpO2 100%   Physical Exam Vitals and nursing note reviewed.  Constitutional:      General: He is not in acute distress.    Appearance: Normal appearance. He is well-developed. He is not ill-appearing.  HENT:     Head: Normocephalic and atraumatic.  Eyes:     Conjunctiva/sclera: Conjunctivae normal.  Cardiovascular:     Rate and Rhythm: Normal rate and regular rhythm.     Heart sounds: No murmur heard. Pulmonary:     Effort: Pulmonary effort is normal. No respiratory distress.     Breath sounds: Normal breath sounds.  Abdominal:     Palpations: Abdomen is soft.     Tenderness: There is no abdominal tenderness.  Musculoskeletal:        General: No swelling.     Cervical back: Neck supple.  Skin:    General: Skin is warm and dry.     Capillary Refill: Capillary refill takes less than 2 seconds.  Neurological:     Mental Status: He is alert.  Psychiatric:        Mood and Affect: Mood normal.     (  all labs ordered are listed, but only abnormal results are displayed) Labs Reviewed - No data to display  EKG: None  Radiology: No results found.   Procedures   Medications Ordered in the ED  Chlorhexidine  Gluconate Cloth 2 % PADS 6 each (has no administration in time range)                                    Medical Decision Making Social determinants of health: schizophrenia, patient comes to the ER three times a week for dialysis  Patient here for dialysis. He comes in the ER every other day for dialysis. Nephrology paged and Dr. Geralynn will arrange dialysis. Patient agreeable with the plan.   Problems Addressed: ESRD on dialysis [facility]): chronic illness or injury with exacerbation,  progression, or side effects of treatment  Amount and/or Complexity of Data Reviewed External Data Reviewed: notes.    Details: Prior ED visits from two days ago reviewed and patient was here for dialysis  Discussion of management or test interpretation with external provider(s): Dr. Geralynn - nephrology- he will arrange dialysis for the patient.   Risk OTC drugs. Prescription drug management. Diagnosis or treatment significantly limited by social determinants of health.     Final diagnoses:  ESRD on dialysis [facility])    ED Discharge Orders     None          Gennaro Duwaine CROME, DO 03/17/24 [phone]

## 2024-03-17 NOTE — ED Triage Notes (Signed)
 Pt here for dialysis

## 2024-03-18 ENCOUNTER — Other Ambulatory Visit: Payer: Self-pay

## 2024-03-18 ENCOUNTER — Other Ambulatory Visit (HOSPITAL_COMMUNITY): Payer: Self-pay

## 2024-03-18 DIAGNOSIS — B351 Tinea unguium: Secondary | ICD-10-CM | POA: Diagnosis not present

## 2024-03-18 DIAGNOSIS — H548 Legal blindness, as defined in USA: Secondary | ICD-10-CM | POA: Diagnosis not present

## 2024-03-18 MED ORDER — CICLOPIROX 8 % EX SOLN
1.0000 | Freq: Every day | CUTANEOUS | 11 refills | Status: DC
  Filled 2024-03-18: qty 6.6, 30d supply, fill #0

## 2024-03-19 ENCOUNTER — Emergency Department (HOSPITAL_COMMUNITY)
Admission: EM | Admit: 2024-03-19 | Discharge: 2024-03-19 | Discharge status: Against Medical Advice | Attending: Emergency Medicine | Admitting: Emergency Medicine

## 2024-03-19 ENCOUNTER — Other Ambulatory Visit: Payer: Self-pay

## 2024-03-19 ENCOUNTER — Encounter (HOSPITAL_COMMUNITY): Payer: Self-pay | Admitting: Emergency Medicine

## 2024-03-19 DIAGNOSIS — Z992 Dependence on renal dialysis: Secondary | ICD-10-CM | POA: Diagnosis not present

## 2024-03-19 DIAGNOSIS — I509 Heart failure, unspecified: Secondary | ICD-10-CM | POA: Diagnosis not present

## 2024-03-19 DIAGNOSIS — N186 End stage renal disease: Secondary | ICD-10-CM | POA: Insufficient documentation

## 2024-03-19 DIAGNOSIS — I120 Hypertensive chronic kidney disease with stage 5 chronic kidney disease or end stage renal disease: Secondary | ICD-10-CM | POA: Diagnosis not present

## 2024-03-19 LAB — RENAL FUNCTION PANEL
Albumin: 3.8 g/dL (ref 3.5–5.0)
Anion gap: 17 — ABNORMAL HIGH (ref 5–15)
BUN: 69 mg/dL — ABNORMAL HIGH (ref 6–20)
CO2: 24 mmol/L (ref 22–32)
Calcium: 7.8 mg/dL — ABNORMAL LOW (ref 8.9–10.3)
Chloride: 98 mmol/L (ref 98–111)
Creatinine, Ser: 11.26 mg/dL — ABNORMAL HIGH (ref 0.61–1.24)
GFR, Estimated: 5 mL/min — ABNORMAL LOW (ref >60)
Glucose, Bld: 87 mg/dL (ref 70–99)
Phosphorus: 10.1 mg/dL — ABNORMAL HIGH (ref 2.5–4.6)
Potassium: 4.6 mmol/L (ref 3.5–5.1)
Sodium: 139 mmol/L (ref 135–145)

## 2024-03-19 LAB — CBC WITH DIFFERENTIAL/PLATELET
Abs Immature Granulocytes: 0.01 K/uL (ref 0.00–0.07)
Basophils Absolute: 0.1 K/uL (ref 0.0–0.1)
Basophils Relative: 1 %
Eosinophils Absolute: 0.4 K/uL (ref 0.0–0.5)
Eosinophils Relative: 7 %
HCT: 33 % — ABNORMAL LOW (ref 39.0–52.0)
Hemoglobin: 10.6 g/dL — ABNORMAL LOW (ref 13.0–17.0)
Immature Granulocytes: 0 %
Lymphocytes Relative: 20 %
Lymphs Abs: 1.2 K/uL (ref 0.7–4.0)
MCH: 30.1 pg (ref 26.0–34.0)
MCHC: 32.1 g/dL (ref 30.0–36.0)
MCV: 93.8 fL (ref 80.0–100.0)
Monocytes Absolute: 0.7 K/uL (ref 0.1–1.0)
Monocytes Relative: 11 %
Neutro Abs: 3.8 K/uL (ref 1.7–7.7)
Neutrophils Relative %: 61 %
Platelets: 245 K/uL (ref 150–400)
RBC: 3.52 MIL/uL — ABNORMAL LOW (ref 4.22–5.81)
RDW: 18.6 % — ABNORMAL HIGH (ref 11.5–15.5)
WBC: 6.1 K/uL (ref 4.0–10.5)
nRBC: 0 % (ref 0.0–0.2)

## 2024-03-19 MED ORDER — FENTANYL CITRATE (PF) 50 MCG/ML IJ SOSY: 50.0000 ug | PREFILLED_SYRINGE | Freq: Once | INTRAMUSCULAR | Status: DC

## 2024-03-19 NOTE — ED Provider Notes (Signed)
 [place] EMERGENCY DEPARTMENT AT [facility] Provider Note   CSN: [id number] Arrival date & time: 03/19/24  0700     Patient presents with: No chief complaint on file.   Paul [name] is a 58 y.o. male.   The history is provided by the patient and medical records. No language interpreter was used.  Illness Location:  Here for dialysis Quality:  No complaints Severity:  Unable to specify Onset quality:  Unable to specify Timing:  Unable to specify Progression:  Unable to specify Associated symptoms: no abdominal pain, no chest pain, no congestion, no cough, no diarrhea, no ear pain, no fatigue, no fever, no headaches, no loss of consciousness, no nausea, no rash, no shortness of breath, no vomiting and no wheezing        Prior to Admission medications   Medication Sig Start Date End Date Taking? Authorizing Provider  ARIPiprazole  ER (ABILIFY  MAINTENA) 400 MG PRSY prefilled syringe Inject 400 mg into the muscle every 28 (twenty-eight) days. 02/24/24   Plovsky, Elna, MD  ARIPiprazole  ER (ABILIFY  MAINTENA) 400 MG PRSY prefilled syringe Inject 400 mg into the muscle once for 1 dose. 02/24/24 02/24/24  Plovsky, Elna, MD  carvedilol  (COREG ) 6.25 MG tablet Take 1 tablet (6.25 mg total) by mouth 2 (two) times daily with a meal. 01/13/24   Delbert Clam, MD  ciclopirox (PENLAC) 8 % solution Apply topically at bedtime. Apply over nail and surrounding skin. Apply daily over previous coat. After seven (7) days, may remove with alcohol and continue cycle. 03/18/24     isosorbide  mononitrate (IMDUR ) 30 MG 24 hr tablet Take 1 tablet (30 mg total) by mouth daily. 10/25/23   [name], MD  OVER THE COUNTER MEDICATION Take 1 capsule by mouth daily. [employer]ane for memory    [provider]  rosuvastatin  (CRESTOR ) 5 MG tablet Take 1 tablet (5 mg total) by mouth daily. 03/24/23     sucroferric oxyhydroxide (VELPHORO ) 500 MG chewable tablet Chew 3 tablets (1,500 mg  total) by mouth 3 (three) times daily with meals. Patient not taking: Reported on 02/24/2024 07/29/23   Tawkaliyar, Roya, DO    Allergies: Penicillin g    Review of Systems  Constitutional:  Negative for chills, fatigue and fever.  HENT:  Negative for congestion and ear pain.   Respiratory:  Negative for cough, chest tightness, shortness of breath and wheezing.   Cardiovascular:  Negative for chest pain.  Gastrointestinal:  Negative for abdominal pain, diarrhea, nausea and vomiting.  Genitourinary:  Negative for dysuria and flank pain.  Musculoskeletal:  Negative for back pain, neck pain and neck stiffness.  Skin:  Negative for rash and wound.  Neurological:  Negative for loss of consciousness, light-headedness and headaches.  Psychiatric/Behavioral:  Negative for agitation and confusion.   All other systems reviewed and are negative.   Updated Vital Signs BP (!) 167/88 (BP Location: Left Arm)   Temp 97.8 F (36.6 C) (Oral)   Resp 16   Physical Exam Vitals and nursing note reviewed.  Constitutional:      General: He is not in acute distress.    Appearance: He is well-developed.  HENT:     Head: Normocephalic and atraumatic.     Nose: No congestion or rhinorrhea.     Mouth/Throat:     Mouth: Mucous membranes are moist.     Pharynx: No oropharyngeal exudate or posterior oropharyngeal erythema.  Eyes:     Extraocular Movements: Extraocular movements intact.  Conjunctiva/sclera: Conjunctivae normal.     Pupils: Pupils are equal, round, and reactive to light.  Cardiovascular:     Rate and Rhythm: Normal rate and regular rhythm.     Heart sounds: No murmur heard. Pulmonary:     Effort: Pulmonary effort is normal. No respiratory distress.     Breath sounds: Normal breath sounds. No wheezing, rhonchi or rales.  Abdominal:     General: Abdomen is flat.  Musculoskeletal:        General: No swelling or tenderness.     Cervical back: Neck supple.  Skin:    General: Skin is  warm and dry.     Capillary Refill: Capillary refill takes less than 2 seconds.     Findings: No erythema or rash.  Neurological:     General: No focal deficit present.     Mental Status: He is alert.  Psychiatric:        Mood and Affect: Mood normal.     (all labs ordered are listed, but only abnormal results are displayed) Labs Reviewed  RENAL FUNCTION PANEL - Abnormal; Notable for the following components:      Result Value   BUN 69 (*)    Creatinine, Ser 11.26 (*)    Calcium  7.8 (*)    Phosphorus 10.1 (*)    GFR, Estimated 5 (*)    Anion gap 17 (*)    All other components within normal limits  CBC WITH DIFFERENTIAL/PLATELET - Abnormal; Notable for the following components:   RBC 3.52 (*)    Hemoglobin 10.6 (*)    HCT 33.0 (*)    RDW 18.6 (*)    All other components within normal limits    EKG: None  Radiology: No results found.   Procedures   Medications Ordered in the ED - No data to display                                  Medical Decision Making Amount and/or Complexity of Data Reviewed Labs: ordered.    Paul [name] is a 58 y.o. male with a past medical history significant for CHF, ESRD on dialysis MWF, schizophrenia, and bipolar disorder who presents for his dialysis.  He denies any complaints today.  Denies any chest pain short of breath or other complaints to me.  On exam, lungs clear.  Patient in no distress.  He is resting comfortably and sleeping.  Will order the renal function panel and CBC as he normally gets and he will go to dialysis.  Given his lack of complaints anticipate discharge when he returns.  Patient was awaiting to go to dialysis but decided he wanted to leave.  Nursing tells me he eloped and will come back tomorrow for dialysis.     Clinical Impression: 1. Dialysis patient     Disposition: Eloped  Condition: stable  I have discussed the results, Dx and Tx plan with the pt(& family if present). He/she/they expressed  understanding and agree(s) with the plan. Discharge instructions discussed at great length. Strict return precautions discussed and pt &/or family have verbalized understanding of the instructions. No further questions at time of discharge.    Discharge Medication List as of 03/19/2024  2:57 PM      Follow Up: No follow-up provider specified.       Brandi Tomlinson, Lonni PARAS, MD 03/19/24 [phone]

## 2024-03-19 NOTE — ED Triage Notes (Signed)
 Pt is here for Dialysis.  No other complaints.

## 2024-03-19 NOTE — ED Notes (Signed)
 Patient called dialysis and was told they weren't aware he was here , I had called and documented [phone] that patient was here in the ED.

## 2024-03-19 NOTE — ED Notes (Signed)
 Dialysis unit aware patient is here.

## 2024-03-19 NOTE — ED Notes (Signed)
 2 extra SST drawn

## 2024-03-20 ENCOUNTER — Encounter (HOSPITAL_COMMUNITY): Payer: Self-pay

## 2024-03-20 ENCOUNTER — Other Ambulatory Visit: Payer: Self-pay

## 2024-03-20 ENCOUNTER — Other Ambulatory Visit (HOSPITAL_COMMUNITY): Payer: Self-pay

## 2024-03-20 ENCOUNTER — Emergency Department (HOSPITAL_COMMUNITY)
Admission: EM | Admit: 2024-03-20 | Discharge: 2024-03-20 | Discharge status: Against Medical Advice | Attending: Emergency Medicine | Admitting: Emergency Medicine

## 2024-03-20 DIAGNOSIS — Z992 Dependence on renal dialysis: Secondary | ICD-10-CM | POA: Diagnosis not present

## 2024-03-20 DIAGNOSIS — I509 Heart failure, unspecified: Secondary | ICD-10-CM | POA: Insufficient documentation

## 2024-03-20 DIAGNOSIS — Z5329 Procedure and treatment not carried out because of patient's decision for other reasons: Secondary | ICD-10-CM | POA: Insufficient documentation

## 2024-03-20 DIAGNOSIS — N186 End stage renal disease: Secondary | ICD-10-CM | POA: Diagnosis not present

## 2024-03-20 LAB — IRON AND TIBC
Iron: 37 ug/dL — ABNORMAL LOW (ref 45–182)
Saturation Ratios: 15 % — ABNORMAL LOW (ref 17.9–39.5)
TIBC: 248 ug/dL — ABNORMAL LOW (ref 250–450)
UIBC: 211 ug/dL

## 2024-03-20 LAB — FERRITIN: Ferritin: 509 ng/mL — ABNORMAL HIGH (ref 24–336)

## 2024-03-20 MED ORDER — LIDOCAINE HCL (PF) 1 % IJ SOLN: 5.0000 mL | INTRAMUSCULAR | Status: DC | PRN

## 2024-03-20 MED ORDER — PENTAFLUOROPROP-TETRAFLUOROETH EX AERO: 1.0000 | INHALATION_SPRAY | CUTANEOUS | Status: DC | PRN

## 2024-03-20 MED ORDER — ALTEPLASE 2 MG IJ SOLR: 2.0000 mg | Freq: Once | INTRAMUSCULAR | Status: DC | PRN

## 2024-03-20 MED ORDER — NEPRO/CARBSTEADY PO LIQD: 237.0000 mL | ORAL | Status: DC | PRN

## 2024-03-20 MED ORDER — LIDOCAINE-PRILOCAINE 2.5-2.5 % EX CREA
1.0000 | TOPICAL_CREAM | CUTANEOUS | Status: DC | PRN
Start: 2024-03-20 — End: 2024-03-20

## 2024-03-20 MED ORDER — CHLORHEXIDINE GLUCONATE CLOTH 2 % EX PADS: 6.0000 | MEDICATED_PAD | Freq: Every day | CUTANEOUS | Status: DC

## 2024-03-20 MED ORDER — HEPARIN SODIUM (PORCINE) 1000 UNIT/ML DIALYSIS: 2400.0000 [IU] | Freq: Once | INTRAMUSCULAR | Status: DC

## 2024-03-20 MED ORDER — LIDOCAINE-PRILOCAINE 2.5-2.5 % EX CREA: 1.0000 | TOPICAL_CREAM | CUTANEOUS | Status: DC | PRN

## 2024-03-20 MED ORDER — NEPRO/CARBSTEADY PO LIQD
237.0000 mL | ORAL | Status: DC | PRN
  Filled 2024-03-20: qty 237

## 2024-03-20 MED ORDER — HEPARIN SODIUM (PORCINE) 1000 UNIT/ML DIALYSIS: 1000.0000 [IU] | INTRAMUSCULAR | Status: DC | PRN

## 2024-03-20 MED ORDER — ANTICOAGULANT SODIUM CITRATE 4% (200MG/5ML) IV SOLN: 5.0000 mL | Status: DC | PRN

## 2024-03-20 MED ORDER — HEPARIN SODIUM (PORCINE) 1000 UNIT/ML DIALYSIS
2400.0000 [IU] | Freq: Once | INTRAMUSCULAR | Status: AC
  Administered 2024-03-20: 2400 [IU] via INTRAVENOUS_CENTRAL

## 2024-03-20 NOTE — Progress Notes (Signed)
   03/20/24 1300  Vitals  Temp (!) 97.5 F (36.4 C)  Pulse Rate 80  Resp 17  BP 135/80  SpO2 100 %  O2 Device Room Air  Weight 71 kg  Type of Weight Post-Dialysis  Oxygen Therapy  Patient Activity (if Appropriate) In bed  Pulse Oximetry Type Continuous  Oximetry Probe Site Changed No  Post Treatment  Dialyzer Clearance Lightly streaked  Liters Processed 84  Fluid Removed (mL) 1500 mL  Tolerated HD Treatment Yes  AVG/AVF Arterial Site Held (minutes) 10 minutes  AVG/AVF Venous Site Held (minutes) 10 minutes   Received patient in bed to unit.  Alert and oriented.  Informed consent signed and in chart.   TX duration:3.5  Patient tolerated well.  Transported back to the room  Alert, without acute distress.  Hand-off given to patient's nurse.   Access used: RUAF Access issues: no complications  Total UF removed: 1500 Medication(s) given: none   [name]

## 2024-03-20 NOTE — ED Notes (Signed)
 Pt stated he did not want labs drawn in triage since they were done yesterday.

## 2024-03-20 NOTE — Progress Notes (Signed)
 1.5 liters ultrafiltration, condition stable , to be transported to the ED once contact can be made with the RN assuming care or the ED charge RN.

## 2024-03-20 NOTE — ED Notes (Signed)
 Called Dialysis to make them aware that pt is here. Dialysis acknowledged they are aware.

## 2024-03-20 NOTE — ED Provider Notes (Signed)
 [place] EMERGENCY DEPARTMENT AT [facility] Provider Note   CSN: [id number] Arrival date & time: 03/20/24  9283     Patient presents with: Dialysis  HPI LEBERT LOVERN is a 58 y.o. male with ESRD and CHF presenting for dialysis.  He states last dialysis was here this past Wednesday.  He denies chest pain shortness of breath or any other medical complaint at this time.  He is requesting dialysis today.   HPI     Prior to Admission medications   Medication Sig Start Date End Date Taking? Authorizing Provider  ARIPiprazole  ER (ABILIFY  MAINTENA) 400 MG PRSY prefilled syringe Inject 400 mg into the muscle every 28 (twenty-eight) days. 02/24/24  Yes Plovsky, Elna, MD  carvedilol  (COREG ) 6.25 MG tablet Take 1 tablet (6.25 mg total) by mouth 2 (two) times daily with a meal. 01/13/24  Yes Newlin, Enobong, MD  isosorbide  mononitrate (IMDUR ) 30 MG 24 hr tablet Take 1 tablet (30 mg total) by mouth daily. 10/25/23  Yes [name], MD  OVER THE COUNTER MEDICATION Take 1 capsule by mouth daily. [employer] for memory   Yes [provider]  rosuvastatin  (CRESTOR ) 5 MG tablet Take 1 tablet (5 mg total) by mouth daily. 03/24/23  Yes   sucroferric oxyhydroxide (VELPHORO ) 500 MG chewable tablet Chew 3 tablets (1,500 mg total) by mouth 3 (three) times daily with meals. Patient not taking: No sig reported 07/29/23   Heddy Barren, DO    Allergies: Penicillin g    Review of Systems See HPI  Updated Vital Signs BP 110/79   Pulse 78   Temp (!) 97.5 F (36.4 C)   Resp 14   Wt 83.9 kg   SpO2 99%   BMI 32.77 kg/m   Physical Exam Vitals and nursing note reviewed.  HENT:     Head: Normocephalic and atraumatic.     Mouth/Throat:     Mouth: Mucous membranes are moist.  Eyes:     General:        Right eye: No discharge.        Left eye: No discharge.     Conjunctiva/sclera: Conjunctivae normal.  Cardiovascular:     Rate and Rhythm: Normal rate and regular  rhythm.     Pulses: Normal pulses.     Heart sounds: Normal heart sounds.  Pulmonary:     Effort: Pulmonary effort is normal.     Breath sounds: Normal breath sounds.  Abdominal:     General: Abdomen is flat.     Palpations: Abdomen is soft.  Skin:    General: Skin is warm and dry.  Neurological:     General: No focal deficit present.  Psychiatric:        Mood and Affect: Mood normal.     (all labs ordered are listed, but only abnormal results are displayed) Labs Reviewed  IRON  AND TIBC - Abnormal; Notable for the following components:      Result Value   Iron  37 (*)    TIBC 248 (*)    Saturation Ratios 15 (*)    All other components within normal limits  FERRITIN - Abnormal; Notable for the following components:   Ferritin 509 (*)    All other components within normal limits  RENAL FUNCTION PANEL  CBC WITH DIFFERENTIAL/PLATELET    EKG: None  Radiology: No results found.   Procedures   Medications Ordered in the ED  Chlorhexidine  Gluconate Cloth 2 % PADS 6 each (has no  administration in time range)  heparin  injection 2,400 Units (2,400 Units Dialysis Given 03/20/24 1031)                                    Medical Decision Making  58 year old well-appearing male who is well-known to the department here for dialysis.  He has no other medical complaints at this time.  Exam is unremarkable.  Reached out to Dr. Geralynn of nephrology who agreed to list him for dialysis later today.  On reassessment he remained well-appearing and hemodynamically stable.  Now has been transferred to hemodialysis for treatment.  Plan will be to return here to the ED for reevaluation and likely discharge after dialysis     Final diagnoses:  Encounter for dialysis    ED Discharge Orders     None          Lang Norleen POUR, PA-C 03/20/24 1557    Yolande Lamar BROCKS, MD 03/23/24 1246

## 2024-03-20 NOTE — ED Triage Notes (Signed)
 Pt came in via POV requesting dialysis, denies any CP/SOB or any other acute pain.

## 2024-03-20 NOTE — Procedures (Addendum)
 Asked to see this patient for hospital dialysis. Pt was discharged from his outpatient unit for behavior issues. The plan will be for ED HD. Pt is not to be admitted at this time. Pt will go to the dialysis unit when they are ready for the patient. When dialysis is completed pt will be sent back to ED for reassessment.   Vitals:   03/20/24 0722  BP: (!) 162/97  Pulse: 86  Resp: 18  Temp: 97.8 F (36.6 C)  SpO2: 100%   Last OP HD unit info from 07/2023 -->  4h  B400 76.5kg  RUA AVF Heparin  2400    Clinical issues other than volume: 1) Anemia of esrd:  - pt dose not have a nephrologist or HD unit so does not have access to ESA or IV iron , other than when here for hospital HD sessions - last tsat 24% on 12/12/23             - IV venofer  200mg  10/15  - check tsat/ ferritin today   - recent Hb's 9.0- 10.6 - last 3 doses of darbepoeitin here -->   - 150 mcg 10/13 - 150 mcg 10/29 - 150 mcg 11/05 - no esa needs today  I was present at the procedure, reviewed the HD regimen and made appropriate changes.   Myer Fret MD  [facility] 03/20/2024, 7:52 AM    Recent Labs  Lab 03/15/24 1400 03/19/24 1101  HGB 9.0* 10.6*  ALBUMIN 3.4* 3.8  CALCIUM  7.5* 7.8*  PHOS 10.3* 10.1*  CREATININE 12.97* 11.26*  K 5.4* 4.6   No results for input(s): IRON , TIBC, FERRITIN in the last 168 hours. Inpatient medications:  ARIPiprazole  ER  400 mg Intramuscular Q28 days

## 2024-03-22 ENCOUNTER — Other Ambulatory Visit: Payer: Self-pay

## 2024-03-22 ENCOUNTER — Emergency Department (HOSPITAL_COMMUNITY)
Admission: EM | Admit: 2024-03-22 | Discharge: 2024-03-22 | Discharge status: Against Medical Advice | Attending: Emergency Medicine | Admitting: Emergency Medicine

## 2024-03-22 ENCOUNTER — Encounter (HOSPITAL_COMMUNITY): Payer: Self-pay | Admitting: *Deleted

## 2024-03-22 ENCOUNTER — Other Ambulatory Visit (HOSPITAL_COMMUNITY): Payer: Self-pay

## 2024-03-22 DIAGNOSIS — I120 Hypertensive chronic kidney disease with stage 5 chronic kidney disease or end stage renal disease: Secondary | ICD-10-CM | POA: Diagnosis not present

## 2024-03-22 DIAGNOSIS — Z992 Dependence on renal dialysis: Secondary | ICD-10-CM | POA: Diagnosis not present

## 2024-03-22 DIAGNOSIS — N186 End stage renal disease: Secondary | ICD-10-CM | POA: Diagnosis not present

## 2024-03-22 DIAGNOSIS — Z5329 Procedure and treatment not carried out because of patient's decision for other reasons: Secondary | ICD-10-CM | POA: Diagnosis not present

## 2024-03-22 LAB — RENAL FUNCTION PANEL
Albumin: 3.5 g/dL (ref 3.5–5.0)
Anion gap: 16 — ABNORMAL HIGH (ref 5–15)
BUN: 69 mg/dL — ABNORMAL HIGH (ref 6–20)
CO2: 21 mmol/L — ABNORMAL LOW (ref 22–32)
Calcium: 7.2 mg/dL — ABNORMAL LOW (ref 8.9–10.3)
Chloride: 100 mmol/L (ref 98–111)
Creatinine, Ser: 11.89 mg/dL — ABNORMAL HIGH (ref 0.61–1.24)
GFR, Estimated: 4 mL/min — ABNORMAL LOW (ref >60)
Glucose, Bld: 93 mg/dL (ref 70–99)
Phosphorus: 9.8 mg/dL — ABNORMAL HIGH (ref 2.5–4.6)
Potassium: 4.3 mmol/L (ref 3.5–5.1)
Sodium: 137 mmol/L (ref 135–145)

## 2024-03-22 LAB — CBC WITH DIFFERENTIAL/PLATELET
Abs Immature Granulocytes: 0.01 K/uL (ref 0.00–0.07)
Basophils Absolute: 0 K/uL (ref 0.0–0.1)
Basophils Relative: 1 %
Eosinophils Absolute: 0.3 K/uL (ref 0.0–0.5)
Eosinophils Relative: 5 %
HCT: 28.2 % — ABNORMAL LOW (ref 39.0–52.0)
Hemoglobin: 9.3 g/dL — ABNORMAL LOW (ref 13.0–17.0)
Immature Granulocytes: 0 %
Lymphocytes Relative: 20 %
Lymphs Abs: 1.2 K/uL (ref 0.7–4.0)
MCH: 30.2 pg (ref 26.0–34.0)
MCHC: 33 g/dL (ref 30.0–36.0)
MCV: 91.6 fL (ref 80.0–100.0)
Monocytes Absolute: 0.7 K/uL (ref 0.1–1.0)
Monocytes Relative: 12 %
Neutro Abs: 3.8 K/uL (ref 1.7–7.7)
Neutrophils Relative %: 62 %
Platelets: 238 K/uL (ref 150–400)
RBC: 3.08 MIL/uL — ABNORMAL LOW (ref 4.22–5.81)
RDW: 18.1 % — ABNORMAL HIGH (ref 11.5–15.5)
WBC: 6.1 K/uL (ref 4.0–10.5)
nRBC: 0 % (ref 0.0–0.2)

## 2024-03-22 LAB — I-STAT CHEM 8, ED
BUN: 66 mg/dL — ABNORMAL HIGH (ref 6–20)
Calcium, Ion: 0.88 mmol/L — CL (ref 1.15–1.40)
Chloride: 100 mmol/L (ref 98–111)
Creatinine, Ser: 11.9 mg/dL — ABNORMAL HIGH (ref 0.61–1.24)
Glucose, Bld: 93 mg/dL (ref 70–99)
HCT: 30 % — ABNORMAL LOW (ref 39.0–52.0)
Hemoglobin: 10.2 g/dL — ABNORMAL LOW (ref 13.0–17.0)
Potassium: 4.4 mmol/L (ref 3.5–5.1)
Sodium: 137 mmol/L (ref 135–145)
TCO2: 23 mmol/L (ref 22–32)

## 2024-03-22 MED ORDER — ANTICOAGULANT SODIUM CITRATE 4% (200MG/5ML) IV SOLN: 5.0000 mL | Status: DC | PRN

## 2024-03-22 MED ORDER — CHLORHEXIDINE GLUCONATE CLOTH 2 % EX PADS: 6.0000 | MEDICATED_PAD | Freq: Every day | CUTANEOUS | Status: DC

## 2024-03-22 MED ORDER — HEPARIN SODIUM (PORCINE) 1000 UNIT/ML IJ SOLN
INTRAMUSCULAR | Status: AC
  Filled 2024-03-22: qty 3

## 2024-03-22 MED ORDER — HEPARIN SODIUM (PORCINE) 1000 UNIT/ML DIALYSIS
2400.0000 [IU] | Freq: Once | INTRAMUSCULAR | Status: AC
  Administered 2024-03-22: 2400 [IU] via INTRAVENOUS_CENTRAL
  Filled 2024-03-22: qty 3

## 2024-03-22 MED ORDER — HEPARIN SODIUM (PORCINE) 1000 UNIT/ML DIALYSIS: 1000.0000 [IU] | INTRAMUSCULAR | Status: DC | PRN

## 2024-03-22 MED ORDER — PENTAFLUOROPROP-TETRAFLUOROETH EX AERO
1.0000 | INHALATION_SPRAY | CUTANEOUS | Status: DC | PRN
Start: 2024-03-22 — End: 2024-03-22

## 2024-03-22 MED ORDER — LIDOCAINE-PRILOCAINE 2.5-2.5 % EX CREA
1.0000 | TOPICAL_CREAM | CUTANEOUS | Status: DC | PRN
Start: 2024-03-22 — End: 2024-03-22

## 2024-03-22 MED ORDER — LIDOCAINE HCL (PF) 1 % IJ SOLN: 5.0000 mL | INTRAMUSCULAR | Status: DC | PRN

## 2024-03-22 MED ORDER — ALTEPLASE 2 MG IJ SOLR: 2.0000 mg | Freq: Once | INTRAMUSCULAR | Status: DC | PRN

## 2024-03-22 MED ORDER — LIDOCAINE-PRILOCAINE 2.5-2.5 % EX CREA: 1.0000 | TOPICAL_CREAM | CUTANEOUS | Status: DC | PRN

## 2024-03-22 MED ORDER — NEPRO/CARBSTEADY PO LIQD: 237.0000 mL | ORAL | Status: DC | PRN

## 2024-03-22 NOTE — ED Notes (Signed)
 Patient refused lab draw, stated, they drew it earlier. Other phlebotomist witnessed, RN notified. KIT

## 2024-03-22 NOTE — ED Provider Notes (Signed)
 [place] EMERGENCY DEPARTMENT AT [facility] Provider Note   CSN: [id number] Arrival date & time: 03/22/24  9374     Patient presents with: No chief complaint on file.  HPI Paul [name] is a 58 y.o. male with ESRD presenting for dialysis.  Has no medical plaints at this time.  Reports that he was here for dialysis 2 days ago.  {Add pertinent medical, surgical, social history, OB history to HPI:32947} HPI     Prior to Admission medications   Medication Sig Start Date End Date Taking? Authorizing Provider  ARIPiprazole  ER (ABILIFY  MAINTENA) 400 MG PRSY prefilled syringe Inject 400 mg into the muscle every 28 (twenty-eight) days. 02/24/24   Plovsky, Elna, MD  carvedilol  (COREG ) 6.25 MG tablet Take 1 tablet (6.25 mg total) by mouth 2 (two) times daily with a meal. 01/13/24   Delbert Clam, MD  isosorbide  mononitrate (IMDUR ) 30 MG 24 hr tablet Take 1 tablet (30 mg total) by mouth daily. 10/25/23   [name], MD  OVER THE COUNTER MEDICATION Take 1 capsule by mouth daily. [employer]ane for memory    [provider]  rosuvastatin  (CRESTOR ) 5 MG tablet Take 1 tablet (5 mg total) by mouth daily. 03/24/23     sucroferric oxyhydroxide (VELPHORO ) 500 MG chewable tablet Chew 3 tablets (1,500 mg total) by mouth 3 (three) times daily with meals. Patient not taking: No sig reported 07/29/23   Heddy Barren, DO    Allergies: Penicillin g    Review of Systems See HPI  Updated Vital Signs BP (!) 150/83 (BP Location: Left Arm)   Pulse 78   Temp 97.7 F (36.5 C) (Oral)   Resp 16   SpO2 99%   Physical Exam Vitals and nursing note reviewed.  HENT:     Head: Normocephalic and atraumatic.     Mouth/Throat:     Mouth: Mucous membranes are moist.  Eyes:     General:        Right eye: No discharge.        Left eye: No discharge.     Conjunctiva/sclera: Conjunctivae normal.  Cardiovascular:     Rate and Rhythm: Normal rate and regular rhythm.     Pulses:  Normal pulses.     Heart sounds: Normal heart sounds.  Pulmonary:     Effort: Pulmonary effort is normal.     Breath sounds: Normal breath sounds.  Abdominal:     General: Abdomen is flat.     Palpations: Abdomen is soft.  Skin:    General: Skin is warm and dry.  Neurological:     General: No focal deficit present.  Psychiatric:        Mood and Affect: Mood normal.     (all labs ordered are listed, but only abnormal results are displayed) Labs Reviewed  I-STAT CHEM 8, ED - Abnormal; Notable for the following components:      Result Value   BUN 66 (*)    Creatinine, Ser 11.90 (*)    Calcium , Ion 0.88 (*)    Hemoglobin 10.2 (*)    HCT 30.0 (*)    All other components within normal limits    EKG: None  Radiology: No results found.  {Document cardiac monitor, telemetry assessment procedure when appropriate:32947} Procedures   Medications Ordered in the ED  Chlorhexidine  Gluconate Cloth 2 % PADS 6 each (has no administration in time range)  pentafluoroprop-tetrafluoroeth (GEBAUERS) aerosol 1 Application (has no administration in time range)  lidocaine  (PF) (XYLOCAINE ) 1 % injection 5 mL (has no administration in time range)  lidocaine -prilocaine  (EMLA ) cream 1 Application (has no administration in time range)  feeding supplement (NEPRO CARB STEADY) liquid 237 mL (has no administration in time range)  heparin  injection 1,000 Units (has no administration in time range)  anticoagulant sodium citrate  solution 5 mL (has no administration in time range)  alteplase  (CATHFLO ACTIVASE ) injection 2 mg (has no administration in time range)  heparin  injection 2,400 Units (has no administration in time range)  Chlorhexidine  Gluconate Cloth 2 % PADS 6 each (has no administration in time range)  pentafluoroprop-tetrafluoroeth (GEBAUERS) aerosol 1 Application (has no administration in time range)  lidocaine  (PF) (XYLOCAINE ) 1 % injection 5 mL (has no administration in time range)   lidocaine -prilocaine  (EMLA ) cream 1 Application (has no administration in time range)  heparin  injection 1,000 Units (has no administration in time range)  anticoagulant sodium citrate  solution 5 mL (has no administration in time range)  heparin  injection 2,400 Units (has no administration in time range)      {Click here for ABCD2, HEART and other calculators REFRESH Note before signing:1}                              Medical Decision Making  58 year old well appearing male who is well-known to the department presenting for hemodialysis.  Exam is unremarkable he has no other medical complaints.  Discussed patient with Dr. Geralynn of nephrology who is also well acquainted with the patient and agreed to list him for dialysis later today.  At this time he is well-appearing and hemodynamically stable in no acute distress on room air awaiting transfer to the dialysis unit.  {Document critical care time when appropriate  Document review of labs and clinical decision tools ie CHADS2VASC2, etc  Document your independent review of radiology images and any outside records  Document your discussion with family members, caretakers and with consultants  Document social determinants of health affecting pt's care  Document your decision making why or why not admission, treatments were needed:32947:::1}   Final diagnoses:  Encounter for dialysis    ED Discharge Orders     None

## 2024-03-22 NOTE — ED Triage Notes (Signed)
 Pt arrives requesting dialysis, denies any other symptoms.

## 2024-03-22 NOTE — Procedures (Signed)
 S: seen in [place]. No c/o's today. Tolerating 1.5 L goal very well.   Vitals:   03/22/24 1352 03/22/24 1353 03/22/24 1400 03/22/24 1430  BP:  137/87 (!) 143/94 (!) 156/96  Pulse:  69 68 62  Resp:  16 16 14   Temp:      TempSrc:      SpO2: 100% 99% 99% 100%  Weight:        Recent Labs  Lab 03/19/24 1101 03/22/24 0702 03/22/24 1427  HGB 10.6* 10.2* 9.3*  ALBUMIN 3.8  --   --   CALCIUM  7.8*  --   --   PHOS 10.1*  --   --   CREATININE 11.26* 11.90*  --   K 4.6 4.4  --     Inpatient medications:  Chlorhexidine  Gluconate Cloth  6 each Topical Q0600   Chlorhexidine  Gluconate Cloth  6 each Topical Q0600   heparin   2,400 Units Dialysis Once in dialysis    anticoagulant sodium citrate      anticoagulant sodium citrate      alteplase , anticoagulant sodium citrate , anticoagulant sodium citrate , feeding supplement (NEPRO CARB STEADY), heparin , heparin , lidocaine  (PF), lidocaine  (PF), lidocaine -prilocaine , lidocaine -prilocaine , pentafluoroprop-tetrafluoroeth, pentafluoroprop-tetrafluoroeth  I was present at the procedure, reviewed the HD regimen and made appropriate changes.   Myer Fret MD  [facility] 03/22/2024, 3:00 PM

## 2024-03-22 NOTE — Progress Notes (Signed)
   03/22/24 1729  Vitals  Temp 98.5 F (36.9 C)  Pulse Rate 78  Resp 16  BP (!) 153/89  SpO2 100 %  O2 Device Room Air  Weight 84.6 kg  Type of Weight Post-Dialysis  Oxygen Therapy  Patient Activity (if Appropriate) In bed  Pulse Oximetry Type Continuous  Oximetry Probe Site Changed No  Post Treatment  Dialyzer Clearance Lightly streaked  Hemodialysis Intake (mL) 0 mL  Liters Processed 84  Fluid Removed (mL) 1500 mL  Tolerated HD Treatment Yes  AVG/AVF Arterial Site Held (minutes) 10 minutes  AVG/AVF Venous Site Held (minutes) 10 minutes   Received patient in bed to unit.  Alert and oriented.  Informed consent signed and in chart.   TX duration:3.5  Patient tolerated well.  Transported back to the room  Alert, without acute distress.  Hand-off given to patient's nurse.   Access used: RUAF Access issues: no complications  Total UF removed: 1500 Medication(s) given: none   Delon LITTIE [facility]

## 2024-03-24 ENCOUNTER — Other Ambulatory Visit: Payer: Self-pay

## 2024-03-24 ENCOUNTER — Ambulatory Visit (HOSPITAL_COMMUNITY): Admitting: *Deleted

## 2024-03-24 ENCOUNTER — Encounter (HOSPITAL_COMMUNITY): Payer: Self-pay | Admitting: Psychiatry

## 2024-03-24 ENCOUNTER — Encounter (HOSPITAL_COMMUNITY): Payer: Self-pay

## 2024-03-24 ENCOUNTER — Ambulatory Visit (HOSPITAL_COMMUNITY): Admitting: Psychiatry

## 2024-03-24 ENCOUNTER — Other Ambulatory Visit (HOSPITAL_COMMUNITY): Payer: Self-pay

## 2024-03-24 VITALS — BP 190/108 | HR 94 | Resp 20 | Ht 63.0 in | Wt 190.0 lb

## 2024-03-24 DIAGNOSIS — F22 Delusional disorders: Secondary | ICD-10-CM

## 2024-03-24 DIAGNOSIS — F250 Schizoaffective disorder, bipolar type: Secondary | ICD-10-CM

## 2024-03-24 MED ORDER — ARIPIPRAZOLE ER 400 MG IM PRSY
400.0000 mg | PREFILLED_SYRINGE | INTRAMUSCULAR | 11 refills | Status: AC
  Filled 2024-03-24 – 2024-04-14 (×3): qty 1, 28d supply, fill #0
  Filled 2024-05-12 – 2024-05-13 (×2): qty 1, 28d supply, fill #1
  Filled 2024-06-08: qty 1, 28d supply, fill #2

## 2024-03-24 NOTE — Progress Notes (Unsigned)
 Psychiatric Initial Adult Assessment   Patient Identification: Paul [name] MRN:  [id number] Date of Evaluation:  03/24/2024 Referral Source: Barnie Louder Chief Complaint: I cannot think straight Chief Complaint  Patient presents with   Injections   Visit Diagnosis: Schizoaffective disorder  History of Present Illness:       Today the patient is seen in the office.  Patient is stable.  He is calm he is appropriate.  He started his Abilify  injection approximately a month ago and today he had a second injection.  He has been completely compliant.  We talked about the issue of his attitude towards women which seems to have changed to some degree.  He ultimately is interested in having an interpersonal relationship with a woman but that will take time.  He continues in his men support group but looks forward to going to a different dialysis unit that is more efficient.  Right now the unit he goes to is very time-consuming and he does not have a lot of time to look for a part-time job.  He like to contribute to society in [place] and work.  He says he is sleeping and eating well.  He avoids any alcohol or drugs.  Noted that he has been in a psychiatric hospital 2 times in his life 1 recently and 1 when he was a teenager.  He demonstrates no agitation or no psychosis at this time.  He has a reasonably good professional relationship with Randine Mungo who is like his case production designer, theatre/television/film.  Noted is the patient has been out of prison since 2021. Depression Symptoms:  depressed mood, (Hypo) Manic Symptoms:   Anxiety Symptoms:   Psychotic Symptoms:   PTSD Symptoms: Negative Past Psychiatric History: 3 psychiatric hospitalizations  Previous Psychotropic Medications: Yes   Substance Abuse History in the last 12 months:  Yes.    Consequences of Substance Abuse: NA  Past Medical History:  Past Medical History:  Diagnosis Date   Bipolar disorder (HCC)    Central retinal artery occlusion     with macular infarction of the right eye. status post posterior vitrectomy and photocoagulation with insertion of a shunt in 2006.   CHF (congestive heart failure) (HCC)    chronic mixed syst/diast. 02/2009 echo: Systolic function was mildly reduced. The estimated ejection fraction was in 45%, global HK, Grade I Diast dysfxn.   Chronic kidney disease    ESRD   Heart failure    Obesity    Osteoarthritis    s/p right hip replacement in 2001   Schizophrenia (HCC)    Severe uncontrolled hypertension     Past Surgical History:  Procedure Laterality Date   A/V FISTULAGRAM Right 02/05/2024   Procedure: A/V Fistulagram;  Surgeon: Serene Gaile ORN, MD;  Location: [facility];  Service: Cardiovascular;  Laterality: Right;   A/V SHUNT INTERVENTION N/A 10/08/2023   Procedure: A/V SHUNT INTERVENTION;  Surgeon: Sheree Penne Bruckner, MD;  Location: [facility];  Service: Cardiovascular;  Laterality: N/A;   AV FISTULA PLACEMENT Right 07/08/2022   Procedure: RIGHT RADIOCEPHALIC ARTERIOVENOUS (AV) FISTULA CREATION;  Surgeon: Lanis Fonda BRAVO, MD;  Location: [facility] OR;  Service: Vascular;  Laterality: Right;   IR FLUORO GUIDE CV LINE RIGHT  05/13/2022   IR US  GUIDE VASC ACCESS RIGHT  05/13/2022   JOINT REPLACEMENT     right hip replacement   right eye surgery     for glaucoma   TOTAL HIP ARTHROPLASTY Bilateral 1997, 2001   UPPER EXTREMITY  INTERVENTION  10/08/2023   Procedure: UPPER EXTREMITY INTERVENTION;  Surgeon: Sheree Penne Bruckner, MD;  Location: [facility];  Service: Cardiovascular;;  Stent   VENOUS ANGIOPLASTY  10/08/2023   Procedure: VENOUS ANGIOPLASTY;  Surgeon: Sheree Penne Bruckner, MD;  Location: [facility];  Service: Cardiovascular;;   VENOUS ANGIOPLASTY Right 02/05/2024   Procedure: VENOUS ANGIOPLASTY;  Surgeon: Serene Gaile ORN, MD;  Location: [facility];  Service: Cardiovascular;  Laterality: Right;  Cephalic Vein; Cephalic Arch    Family Psychiatric History:   Family History:  Family  History  Problem Relation Age of Onset   Hypertension Father    Stroke Father     Social History:   Social History   Socioeconomic History   Marital status: Legally Separated    Spouse name: Not on file   Number of children: Not on file   Years of education: Not on file   Highest education level: Not on file  Occupational History   Occupation: chartered certified accountant  Tobacco Use   Smoking status: Former   Smokeless tobacco: Never   Tobacco comments:    Quit smoking 20-30 years ago, smoked for 1 year.  Vaping Use   Vaping status: Never Used  Substance and Sexual Activity   Alcohol use: No   Drug use: No   Sexual activity: Yes    Birth control/protection: None  Other Topics Concern   Not on file  Social History Narrative   Not on file   Social Drivers of Health   Financial Resource Strain: High Risk (09/15/2023)   Overall Financial Resource Strain (CARDIA)    Difficulty of Paying Living Expenses: Very hard  Food Insecurity: No Food Insecurity (09/15/2023)   Hunger Vital Sign    Worried About Running Out of Food in the Last Year: Never true    Ran Out of Food in the Last Year: Never true  Transportation Needs: Unmet Transportation Needs (09/15/2023)   PRAPARE - Transportation    Lack of Transportation (Medical): Yes    Lack of Transportation (Non-Medical): Yes  Physical Activity: Not on file  Stress: No Stress Concern Present (09/15/2023)   [employer] of Occupational Health - Occupational Stress Questionnaire    Feeling of Stress : Only a little  Social Connections: Moderately Integrated (07/01/2023)   Social Connection and Isolation Panel    Frequency of Communication with Friends and Family: Three times a week    Frequency of Social Gatherings with Friends and Family: Never    Attends Religious Services: More than 4 times per year    Active Member of [employer] or Organizations: Yes    Attends Engineer, Structural: More than 4 times per year    Marital Status:  Divorced    Additional Social History:   Allergies:   Allergies  Allergen Reactions   Penicillin G Nausea And Vomiting    Metabolic Disorder Labs: Lab Results  Component Value Date   HGBA1C 5.5 06/04/2021   MPG 111.15 06/04/2021   No results found for: PROLACTIN Lab Results  Component Value Date   CHOL 163 06/05/2021   TRIG 230 (H) 06/05/2021   HDL 37 (L) 06/05/2021   CHOLHDL 4.4 06/05/2021   VLDL 46 (H) 06/05/2021   LDLCALC 80 06/05/2021   LDLCALC  12/16/2008    86        Total Cholesterol/HDL:CHD Risk Coronary Heart Disease Risk Table  Men   Women  1/2 Average Risk   3.4   3.3  Average Risk       5.0   4.4  2 X Average Risk   9.6   7.1  3 X Average Risk  23.4   11.0        Use the calculated Patient Ratio above and the CHD Risk Table to determine the patient's CHD Risk.        ATP III CLASSIFICATION (LDL):  <100     mg/dL   Optimal  899-870  mg/dL   Near or Above                    Optimal  130-159  mg/dL   Borderline  839-810  mg/dL   High  >809     mg/dL   Very High     Therapeutic Level Labs: No results found for: LITHIUM No results found for: CBMZ No results found for: VALPROATE  Current Medications: Current Outpatient Medications  Medication Sig Dispense Refill   ARIPiprazole  ER (ABILIFY  MAINTENA) 400 MG PRSY prefilled syringe Inject 400 mg into the muscle every 28 (twenty-eight) days. 1 each 11   carvedilol  (COREG ) 6.25 MG tablet Take 1 tablet (6.25 mg total) by mouth 2 (two) times daily with a meal. 180 tablet 1   isosorbide  mononitrate (IMDUR ) 30 MG 24 hr tablet Take 1 tablet (30 mg total) by mouth daily. 30 tablet 6   OVER THE COUNTER MEDICATION Take 1 capsule by mouth daily. [place] for memory     rosuvastatin  (CRESTOR ) 5 MG tablet Take 1 tablet (5 mg total) by mouth daily. 90 tablet 3   sucroferric oxyhydroxide (VELPHORO ) 500 MG chewable tablet Chew 3 tablets (1,500 mg total) by mouth 3 (three) times daily with  meals. (Patient not taking: No sig reported) 90 tablet 0   Current Facility-Administered Medications  Medication Dose Route Frequency Provider Last Rate Last Admin   ARIPiprazole  ER (ABILIFY  MAINTENA) 400 MG prefilled syringe 400 mg  400 mg Intramuscular Q28 days Tasia Lung, MD   400 mg at 03/24/24 1455    Musculoskeletal: Strength & Muscle Tone: abnormal Gait & Station: ataxic Patient leans: Right  Psychiatric Specialty Exam: Review of Systems  Blood pressure (!) 190/108, pulse 94, resp. rate 20, height 5' 3 (1.6 m), weight 190 lb (86.2 kg).Body mass index is 33.66 kg/m.  General Appearance: Casual  Eye Contact:  Fair  Speech:  Clear and Coherent  Volume:  Normal  Mood:  NA  Affect:  Appropriate  Thought Process:  Coherent  Orientation:  Full (Time, Place, and Person)  Thought Content:  Delusions  Suicidal Thoughts:  No  Homicidal Thoughts:  No  Memory:  Negative  Judgement:  Poor  Insight:  Lacking  Psychomotor Activity:  Normal  Concentration:    Recall:  Fair  Fund of Knowledge:Fair  Language: Good  Akathisia:  No  Handed:  Right  AIMS (if indicated  Assets:  Desire for Improvement  ADL's:  Intact  Cognition: WNL  Sleep:  Good   Screenings: CAGE-AID    Flowsheet Row ED to Hosp-Admission (Discharged) from 07/24/2023 in [facility]  CAGE-AID Score 0   GAD-7    Flowsheet Row Office Visit from 09/15/2023 in [facility] [place]. [facility]  Total GAD-7 Score 1   PHQ2-9    Flowsheet Row Office Visit from 01/13/2024 in [facility] Of [place]. [facility] Office Visit from 09/15/2023 in [facility] - A Dept Of [place]. [facility] Office Visit from 06/07/2021 in [facility] - A Dept Of [place] [facility]. [facility] Office Visit from 11/03/2019 in [facility] - A Dept Of [place]. [facility]  PHQ-2 Total Score 2 0 0 0  PHQ-9 Total Score 7 -- -- 3   Flowsheet Row ED from 03/22/2024 in [facility] Emergency Department at [facility] ED from 03/20/2024 in [facility] Emergency Department at [facility] ED from 03/19/2024 in [facility] Emergency Department at [facility]  C-SSRS RISK CATEGORY No Risk No Risk No Risk    Assessment and Plan:   This patient's diagnosis is delusional disorder.  I believe it significantly improved.  At this time I am going to write him a letter of support to help him get into a new more efficient dialysis unit giving him more time to look for a job and do other things.  The patient will continue in his support group and continue to see me.  His next appointment will be in 6 to 7 weeks.  Will get another injection of his Abilify  in 1 month.  I believe the patient is stable. Collaboration of Care:   Patient/Guardian was advised Release of Information must be obtained prior to any record release in order to collaborate their care with an outside provider. Patient/Guardian was advised if they have not already done so to contact the registration department to sign all necessary forms in order for us  to release information regarding their care.   Consent: Patient/Guardian gives verbal consent for treatment and assignment of benefits for services provided during this visit. Patient/Guardian expressed understanding and agreed to proceed.   Elna LILLETTE Lo, MD 11/19/20253:26 PM

## 2024-03-25 ENCOUNTER — Telehealth (HOSPITAL_COMMUNITY): Payer: Self-pay | Admitting: *Deleted

## 2024-03-25 NOTE — Telephone Encounter (Signed)
       Pt presents to clinic today for due Abilify  Maintena 400 mg injection. Pt is cooperative on approach. Mood guarded, affect constricted. Pt states that he hasn't noticed any difference since last (first) injection last month. Pt says that he is in a support group due to having some anger issues. BP elevated today (211/121. Retake 190/108) as pt admits to not taking antihypertensive today. Denies headache or any other symptoms r/t HTN. Pt denies SI,HI, AVH. Injection prepared as ordered and given in RUOQ without complaint. Pt to return in 28 days for next due injection. Pt has visit with Dr. Tasia after injection. Support and encouragement provided.

## 2024-03-26 ENCOUNTER — Other Ambulatory Visit: Payer: Self-pay

## 2024-03-26 ENCOUNTER — Emergency Department (HOSPITAL_COMMUNITY)

## 2024-03-26 ENCOUNTER — Emergency Department (HOSPITAL_COMMUNITY)
Admission: EM | Admit: 2024-03-26 | Discharge: 2024-03-26 | Disposition: A | Discharge status: Home / Self Care | Attending: Emergency Medicine | Admitting: Emergency Medicine

## 2024-03-26 DIAGNOSIS — I509 Heart failure, unspecified: Secondary | ICD-10-CM | POA: Diagnosis not present

## 2024-03-26 DIAGNOSIS — E875 Hyperkalemia: Secondary | ICD-10-CM | POA: Insufficient documentation

## 2024-03-26 DIAGNOSIS — J810 Acute pulmonary edema: Secondary | ICD-10-CM | POA: Diagnosis not present

## 2024-03-26 DIAGNOSIS — R0602 Shortness of breath: Secondary | ICD-10-CM | POA: Diagnosis not present

## 2024-03-26 DIAGNOSIS — N186 End stage renal disease: Secondary | ICD-10-CM | POA: Diagnosis not present

## 2024-03-26 DIAGNOSIS — I132 Hypertensive heart and chronic kidney disease with heart failure and with stage 5 chronic kidney disease, or end stage renal disease: Secondary | ICD-10-CM | POA: Diagnosis not present

## 2024-03-26 DIAGNOSIS — Z992 Dependence on renal dialysis: Secondary | ICD-10-CM | POA: Diagnosis not present

## 2024-03-26 DIAGNOSIS — J811 Chronic pulmonary edema: Secondary | ICD-10-CM | POA: Diagnosis not present

## 2024-03-26 DIAGNOSIS — Z79899 Other long term (current) drug therapy: Secondary | ICD-10-CM | POA: Insufficient documentation

## 2024-03-26 DIAGNOSIS — I517 Cardiomegaly: Secondary | ICD-10-CM | POA: Diagnosis not present

## 2024-03-26 LAB — BASIC METABOLIC PANEL WITH GFR
Anion gap: 17 — ABNORMAL HIGH (ref 5–15)
BUN: 78 mg/dL — ABNORMAL HIGH (ref 6–20)
CO2: 20 mmol/L — ABNORMAL LOW (ref 22–32)
Calcium: 7.4 mg/dL — ABNORMAL LOW (ref 8.9–10.3)
Chloride: 102 mmol/L (ref 98–111)
Creatinine, Ser: 14.18 mg/dL — ABNORMAL HIGH (ref 0.61–1.24)
GFR, Estimated: 4 mL/min — ABNORMAL LOW (ref >60)
Glucose, Bld: 89 mg/dL (ref 70–99)
Potassium: 6.2 mmol/L — ABNORMAL HIGH (ref 3.5–5.1)
Sodium: 139 mmol/L (ref 135–145)

## 2024-03-26 LAB — I-STAT CHEM 8, ED
BUN: 83 mg/dL — ABNORMAL HIGH (ref 6–20)
Calcium, Ion: 0.9 mmol/L — ABNORMAL LOW (ref 1.15–1.40)
Chloride: 107 mmol/L (ref 98–111)
Creatinine, Ser: 13.9 mg/dL — ABNORMAL HIGH (ref 0.61–1.24)
Glucose, Bld: 81 mg/dL (ref 70–99)
HCT: 27 % — ABNORMAL LOW (ref 39.0–52.0)
Hemoglobin: 9.2 g/dL — ABNORMAL LOW (ref 13.0–17.0)
Potassium: 6.9 mmol/L (ref 3.5–5.1)
Sodium: 138 mmol/L (ref 135–145)
TCO2: 21 mmol/L — ABNORMAL LOW (ref 22–32)

## 2024-03-26 LAB — CG4 I-STAT (LACTIC ACID): Lactic Acid, Venous: 0.5 mmol/L (ref 0.5–1.9)

## 2024-03-26 MED ORDER — PENTAFLUOROPROP-TETRAFLUOROETH EX AERO: 1.0000 | INHALATION_SPRAY | CUTANEOUS | Status: DC | PRN

## 2024-03-26 MED ORDER — CALCIUM GLUCONATE-NACL 1-0.675 GM/50ML-% IV SOLN
1.0000 g | Freq: Once | INTRAVENOUS | Status: AC
  Administered 2024-03-26: 1000 mg via INTRAVENOUS
  Filled 2024-03-26: qty 50

## 2024-03-26 MED ORDER — HEPARIN SODIUM (PORCINE) 1000 UNIT/ML DIALYSIS
2400.0000 [IU] | Freq: Once | INTRAMUSCULAR | Status: AC
  Administered 2024-03-26: 2400 [IU] via INTRAVENOUS_CENTRAL

## 2024-03-26 MED ORDER — CHLORHEXIDINE GLUCONATE CLOTH 2 % EX PADS: 6.0000 | MEDICATED_PAD | Freq: Every day | CUTANEOUS | Status: DC

## 2024-03-26 MED ORDER — SODIUM BICARBONATE 8.4 % IV SOLN
50.0000 meq | Freq: Once | INTRAVENOUS | Status: AC
Start: 2024-03-26 — End: 2024-03-26
  Administered 2024-03-26: 50 meq via INTRAVENOUS
  Filled 2024-03-26: qty 50

## 2024-03-26 MED ORDER — HEPARIN SODIUM (PORCINE) 1000 UNIT/ML IJ SOLN
INTRAMUSCULAR | Status: AC
Start: 2024-03-26 — End: 2024-03-26
  Filled 2024-03-26: qty 3

## 2024-03-26 MED ORDER — SODIUM ZIRCONIUM CYCLOSILICATE 10 G PO PACK: 10.0000 g | PACK | Freq: Three times a day (TID) | ORAL | Status: DC

## 2024-03-26 NOTE — ED Notes (Signed)
 Called Dialysis aware patient is here spoke with Jen

## 2024-03-26 NOTE — ED Notes (Signed)
 3 Gold tops sent if needed

## 2024-03-26 NOTE — Procedures (Signed)
 HD Note:  Some information was entered later than the data was gathered due to patient care needs. The stated time with the data is accurate.  Received patient in bed to unit.   Alert and oriented.   Informed consent signed and in chart.   Access used: Upper left fistula Access issues: None  Dr. Geralynn stated patient should have 75 min of 1K acid bath, the rest 2K acid bath. Patient tolerated treatment well.  No issues with low BP.  TX duration: 3 hours and15  Alert, without acute distress.  Total UF removed: 1500 ml  Hand-off given to patient's nurse.   Transported back to the room   Eldrige Pitkin L. Lenon, RN [facility].

## 2024-03-26 NOTE — Discharge Instructions (Signed)
 I discussed the plan for discharge with the patient and/or their surrogate at bedside prior to discharge and they were in agreement with the plan and verbalized understanding of the return precautions provided. All questions answered to the best of my ability. Ultimately, the patient was discharged in stable condition with stable vital signs. I am reassured that they are capable of close follow up and good social support at home.

## 2024-03-26 NOTE — ED Triage Notes (Signed)
 Patient is here for his routine hemodialysis treatment , last treated Monday this week .

## 2024-03-26 NOTE — ED Provider Notes (Signed)
  Physical Exam  BP (!) 182/89   Pulse 77   Temp (!) 97.5 F (36.4 C)   Resp 16   Wt 86.2 kg   SpO2 98%   BMI 33.66 kg/m   Physical Exam Vitals and nursing note reviewed.  Constitutional:      General: He is not in acute distress.    Appearance: He is well-developed.  HENT:     Head: Normocephalic and atraumatic.  Eyes:     Conjunctiva/sclera: Conjunctivae normal.  Cardiovascular:     Rate and Rhythm: Normal rate and regular rhythm.     Heart sounds: No murmur heard. Pulmonary:     Effort: Pulmonary effort is normal. No respiratory distress.     Breath sounds: Normal breath sounds.  Abdominal:     Palpations: Abdomen is soft.     Tenderness: There is no abdominal tenderness.  Musculoskeletal:        General: No swelling.     Cervical back: Neck supple.     Comments: Palpable thrill in the right upper extremity, nontender to palpation, no active bleeding.  Skin:    General: Skin is warm and dry.     Capillary Refill: Capillary refill takes less than 2 seconds.  Neurological:     Mental Status: He is alert.     Gait: Gait normal.  Psychiatric:        Mood and Affect: Mood normal.     Procedures  Procedures  ED Course / MDM    Medical Decision Making Amount and/or Complexity of Data Reviewed Labs: ordered. Radiology: ordered.  Risk Prescription drug management.   Patient overall very well-appearing after returning from dialysis.  Patient states that he feels much more comfortable, denies shortness of breath at this time.  Denies chest pain.  Overall very well-appearing, actively walking around.  Talk with patient about discharge, patient very agreeable at this time and requesting able to go home.  He states that he is able to take the bus, and denies that he wants a bus pass at this time.  Overall with reassuring physical exam, and completion of full dialysis session, patient is stable for discharge at this time.  Strict return precautions discussed to the  ED.       Arlee Katz, MD 03/27/24 [id number]    Patt Alm Macho, MD 03/27/24 [phone]

## 2024-03-26 NOTE — ED Provider Notes (Signed)
 [place] EMERGENCY DEPARTMENT AT [facility] Provider Note   CSN: [id number] Arrival date & time: 03/26/24  9382     Patient presents with: Hemodialysis   VAL SCHIAVO is a 58 y.o. male with past medical history of hypertension, CHF, CKD on dialysis, schizoaffective disorder, hyperlipidemia who presents emergency department for routine hemodialysis.  Patient reports he missed his dialysis on Wednesday, because he was being evaluated by his psychiatrist.  Patient is well-known to the department and typically receives his dialysis through the emergency department.  He does report some increased shortness of breath at this time.  He denies any chest pain.  No nausea, vomiting, abdominal pain.  HPI     Prior to Admission medications   Medication Sig Start Date End Date Taking? Authorizing Provider  ARIPiprazole  ER (ABILIFY  MAINTENA) 400 MG PRSY prefilled syringe Inject 400 mg into the muscle every 28 (twenty-eight) days. 03/24/24   Plovsky, Elna, MD  carvedilol  (COREG ) 6.25 MG tablet Take 1 tablet (6.25 mg total) by mouth 2 (two) times daily with a meal. 01/13/24   Delbert Clam, MD  isosorbide  mononitrate (IMDUR ) 30 MG 24 hr tablet Take 1 tablet (30 mg total) by mouth daily. 10/25/23   [name], MD  OVER THE COUNTER MEDICATION Take 1 capsule by mouth daily. Lion's Mane for memory    [provider]  rosuvastatin  (CRESTOR ) 5 MG tablet Take 1 tablet (5 mg total) by mouth daily. 03/24/23     sucroferric oxyhydroxide (VELPHORO ) 500 MG chewable tablet Chew 3 tablets (1,500 mg total) by mouth 3 (three) times daily with meals. Patient not taking: No sig reported 07/29/23   Tawkaliyar, Roya, DO    Allergies: Penicillin g    Review of Systems  Respiratory:  Positive for shortness of breath.     Updated Vital Signs BP (!) 168/90   Pulse 88   Temp 97.9 F (36.6 C)   Resp 16   SpO2 96%   Physical Exam Vitals and nursing note reviewed.  Constitutional:       Appearance: Normal appearance.  HENT:     Head: Normocephalic and atraumatic.     Mouth/Throat:     Mouth: Mucous membranes are moist.  Eyes:     General: No scleral icterus.       Right eye: No discharge.        Left eye: No discharge.     Conjunctiva/sclera: Conjunctivae normal.  Cardiovascular:     Rate and Rhythm: Normal rate and regular rhythm.     Pulses: Normal pulses.  Pulmonary:     Effort: Pulmonary effort is normal.     Breath sounds: Rhonchi present.     Comments: Scattered rhonchi noted on expiration Abdominal:     General: There is no distension.     Tenderness: There is no abdominal tenderness.  Musculoskeletal:        General: No deformity.     Cervical back: Normal range of motion.  Skin:    General: Skin is warm and dry.     Capillary Refill: Capillary refill takes less than 2 seconds.  Neurological:     Mental Status: He is alert.     Motor: No weakness.  Psychiatric:        Mood and Affect: Mood normal.     (all labs ordered are listed, but only abnormal results are displayed) Labs Reviewed  I-STAT CHEM 8, ED - Abnormal; Notable for the following components:  Result Value   Potassium 6.9 (*)    BUN 83 (*)    Creatinine, Ser 13.90 (*)    Calcium , Ion 0.90 (*)    TCO2 21 (*)    Hemoglobin 9.2 (*)    HCT 27.0 (*)    All other components within normal limits  BASIC METABOLIC PANEL WITH GFR    EKG: EKG Interpretation Date/Time:  Friday March 26 2024 10:35:07 EST Ventricular Rate:  69 PR Interval:  200 QRS Duration:  90 QT Interval:  454 QTC Calculation: 486 R Axis:   -14  Text Interpretation: Normal sinus rhythm Nonspecific T wave abnormality Prolonged QT Abnormal ECG When compared with ECG of 16-Feb-2024 11:15, PREVIOUS ECG IS PRESENT Confirmed by Cleotilde Rogue ([id number]) on 03/26/2024 12:14:48 PM  Radiology: [facility] Chest 2 View Result Date: 03/26/2024 EXAM: 2 VIEW(S) XRAY OF THE CHEST 03/26/2024 09:49:00 AM COMPARISON: 02/09/2024  CLINICAL HISTORY: SHOB FINDINGS: LINES, TUBES AND DEVICES: Vascular stent in right subclavian region noted. LUNGS AND PLEURA: Mild pulmonary edema. No pleural effusion. No pneumothorax. HEART AND MEDIASTINUM: Cardiomegaly. BONES AND SOFT TISSUES: Polyarticular degenerative change. Chronic compression deformities at thoracolumbar junction. IMPRESSION: 1. Cardiomegaly with mild pulmonary edema. Electronically signed by: Franky Stanford MD 03/26/2024 11:21 AM EST RP Workstation: [id number]    Procedures   Medications Ordered in the ED  Chlorhexidine  Gluconate Cloth 2 % PADS 6 each (6 each Topical Not Given 03/26/24 1222)  calcium  gluconate 1 g/ 50 mL sodium chloride  IVPB (1,000 mg Intravenous New Bag/Given 03/26/24 1325)  sodium bicarbonate  injection 50 mEq (50 mEq Intravenous Given 03/26/24 1320)                                 Medical Decision Making Amount and/or Complexity of Data Reviewed Labs: ordered. Radiology: ordered.  Risk Prescription drug management.   This patient presents to the ED for concern of routine hemodialysis, this involves an extensive number of treatment options, and is a complaint that carries with it a high risk of complications and morbidity.   Differential diagnosis includes: ESRD, AKI, sepsis, nephrotoxic drug induced AKI, rhabdo  Co morbidities:  ESRD on dialysis   Additional history: Patient receives routine hemodialysis via emergency department.  Patient was last seen in the emergency department 4 days ago.  Lab Tests:  I Ordered, and personally interpreted labs.  The pertinent results include:    - Potassium: 6.9  Imaging Studies:  I ordered imaging studies including chest x-ray I independently visualized and interpreted imaging which showed cardiomegaly and mild pulmonary edema I agree with the radiologist interpretation  Cardiac Monitoring/ECG:  The patient was maintained on a cardiac monitor.  I personally viewed and interpreted the cardiac  monitored which showed an underlying rhythm of: Normal sinus rhythm  Medicines ordered and prescription drug management:  I ordered medication including  Medications  Chlorhexidine  Gluconate Cloth 2 % PADS 6 each (6 each Topical Not Given 03/26/24 1222)  calcium  gluconate 1 g/ 50 mL sodium chloride  IVPB (1,000 mg Intravenous New Bag/Given 03/26/24 1325)  sodium bicarbonate  injection 50 mEq (50 mEq Intravenous Given 03/26/24 1320)   for hyperkalemia management Reevaluation of the patient after these medicines showed that the patient improved I have reviewed the patients home medicines and have made adjustments as needed  Test Considered:   none  Critical Interventions:   none  Consultations Obtained: -Nephrology  Problem List / ED Course:     ICD-10-CM  1. Encounter for dialysis  Z99.2     2. Hyperkalemia  E87.5     3. Acute pulmonary edema (HCC)  J81.0       MDM: 58 year old male who presents emergency department for routine hemodialysis.  Patient typically receives dialysis Monday, Wednesday, Friday.  However, patient missed his dialysis on Wednesday due to conflicting doctors appointments.  At this time, patient reports increased shortness of breath.  Chest x-ray shows cardiomegaly and mild pulmonary edema.  I suspect the pulmonary edema is likely secondary to the patient's missed dialysis on Wednesday.  I-STAT Chem-8 obtained to evaluate patient's potassium.  Patient's potassium is 6.9 on i-STAT.  I ordered a formal BMP for potassium reevaluation.  EKG ordered and shows normal sinus rhythm without peaked T waves.  Additionally, due to patient's hyperkalemia, I have ordered sodium bicarb and calcium .  Will plan to consult nephrology for dialysis and likely return of patient to emergency department.  I spoke with nephrology, who put in hemodialysis orders for this patient.  Plan for this patient to receive his dialysis and then return to the emergency department.  After  dialysis, anticipate likely patient discharge.   Dispostion:  After consideration of the diagnostic results and the patients response to treatment, I feel that the patient would benefit from dialysis and likely discharge.   Final diagnoses:  Encounter for dialysis  Hyperkalemia  Acute pulmonary edema [facility])    ED Discharge Orders     None          Torrence Marry GORMAN DEVONNA 03/26/24 1353    Cleotilde Rogue, MD 03/29/24 [phone]

## 2024-03-26 NOTE — ED Notes (Signed)
 Pt left without discharge paperwork.

## 2024-03-26 NOTE — Procedures (Signed)
 Asked to see this patient for hospital dialysis. The plan will be for ED HD. Pt is not to be admitted at this time. Pt will go to the dialysis unit when they are ready for the patient. When dialysis is completed pt will be sent back to ED for reassessment.   Vitals:   03/26/24 1344 03/26/24 1346 03/26/24 1355 03/26/24 1600  BP: (!) 178/102 (!) 154/95 (!) 185/107 (!) 152/78  Pulse: 71 70 78 78  Resp: 15 16 16 16   Temp: 98.6 F (37 C)     SpO2: 100% 99% 100% 100%       I was present at the procedure, reviewed the HD regimen and made appropriate changes.   Myer Fret MD  [facility] 03/26/2024, 4:32 PM    Recent Labs  Lab 03/22/24 1427 03/26/24 1137 03/26/24 1309  HGB 9.3* 9.2*  --   ALBUMIN 3.5  --   --   CALCIUM  7.2*  --  7.4*  PHOS 9.8*  --   --   CREATININE 11.89* 13.90* 14.18*  K 4.3 6.9* 6.2*   Recent Labs  Lab 03/20/24 0933  IRON  37*  TIBC 248*  FERRITIN 509*   Inpatient medications:  Chlorhexidine  Gluconate Cloth  6 each Topical Q0600    pentafluoroprop-tetrafluoroeth

## 2024-03-29 ENCOUNTER — Emergency Department (HOSPITAL_COMMUNITY)
Admission: EM | Admit: 2024-03-29 | Discharge: 2024-03-29 | Discharge status: Against Medical Advice | Attending: Emergency Medicine | Admitting: Emergency Medicine

## 2024-03-29 ENCOUNTER — Other Ambulatory Visit: Payer: Self-pay

## 2024-03-29 ENCOUNTER — Encounter (HOSPITAL_COMMUNITY): Payer: Self-pay | Admitting: *Deleted

## 2024-03-29 DIAGNOSIS — N186 End stage renal disease: Secondary | ICD-10-CM | POA: Insufficient documentation

## 2024-03-29 DIAGNOSIS — Z992 Dependence on renal dialysis: Secondary | ICD-10-CM | POA: Insufficient documentation

## 2024-03-29 DIAGNOSIS — R0602 Shortness of breath: Secondary | ICD-10-CM | POA: Diagnosis not present

## 2024-03-29 DIAGNOSIS — I120 Hypertensive chronic kidney disease with stage 5 chronic kidney disease or end stage renal disease: Secondary | ICD-10-CM | POA: Diagnosis not present

## 2024-03-29 LAB — CBC
HCT: 27.5 % — ABNORMAL LOW (ref 39.0–52.0)
Hemoglobin: 9.1 g/dL — ABNORMAL LOW (ref 13.0–17.0)
MCH: 29.9 pg (ref 26.0–34.0)
MCHC: 33.1 g/dL (ref 30.0–36.0)
MCV: 90.5 fL (ref 80.0–100.0)
Platelets: 235 K/uL (ref 150–400)
RBC: 3.04 MIL/uL — ABNORMAL LOW (ref 4.22–5.81)
RDW: 17.5 % — ABNORMAL HIGH (ref 11.5–15.5)
WBC: 6 K/uL (ref 4.0–10.5)
nRBC: 0 % (ref 0.0–0.2)

## 2024-03-29 LAB — RENAL FUNCTION PANEL
Albumin: 3.5 g/dL (ref 3.5–5.0)
Anion gap: 18 — ABNORMAL HIGH (ref 5–15)
BUN: 74 mg/dL — ABNORMAL HIGH (ref 6–20)
CO2: 21 mmol/L — ABNORMAL LOW (ref 22–32)
Calcium: 7.1 mg/dL — ABNORMAL LOW (ref 8.9–10.3)
Chloride: 102 mmol/L (ref 98–111)
Creatinine, Ser: 14.1 mg/dL — ABNORMAL HIGH (ref 0.61–1.24)
GFR, Estimated: 4 mL/min — ABNORMAL LOW (ref >60)
Glucose, Bld: 102 mg/dL — ABNORMAL HIGH (ref 70–99)
Phosphorus: 9.8 mg/dL — ABNORMAL HIGH (ref 2.5–4.6)
Potassium: 5.8 mmol/L — ABNORMAL HIGH (ref 3.5–5.1)
Sodium: 141 mmol/L (ref 135–145)

## 2024-03-29 LAB — HEPATITIS B SURFACE ANTIGEN: Hepatitis B Surface Ag: NONREACTIVE

## 2024-03-29 MED ORDER — ANTICOAGULANT SODIUM CITRATE 4% (200MG/5ML) IV SOLN: 5.0000 mL | Status: DC | PRN

## 2024-03-29 MED ORDER — HEPARIN SODIUM (PORCINE) 1000 UNIT/ML IJ SOLN
INTRAMUSCULAR | Status: AC
  Filled 2024-03-29: qty 2

## 2024-03-29 MED ORDER — LIDOCAINE HCL (PF) 1 % IJ SOLN: 5.0000 mL | INTRAMUSCULAR | Status: DC | PRN

## 2024-03-29 MED ORDER — ALTEPLASE 2 MG IJ SOLR: 2.0000 mg | Freq: Once | INTRAMUSCULAR | Status: DC | PRN

## 2024-03-29 MED ORDER — HEPARIN SODIUM (PORCINE) 1000 UNIT/ML DIALYSIS: 1000.0000 [IU] | INTRAMUSCULAR | Status: DC | PRN

## 2024-03-29 MED ORDER — LIDOCAINE-PRILOCAINE 2.5-2.5 % EX CREA: 1.0000 | TOPICAL_CREAM | CUTANEOUS | Status: DC | PRN

## 2024-03-29 MED ORDER — NEPRO/CARBSTEADY PO LIQD: 237.0000 mL | ORAL | Status: DC | PRN

## 2024-03-29 MED ORDER — CHLORHEXIDINE GLUCONATE CLOTH 2 % EX PADS: 6.0000 | MEDICATED_PAD | Freq: Every day | CUTANEOUS | Status: DC

## 2024-03-29 MED ORDER — HEPARIN SODIUM (PORCINE) 1000 UNIT/ML DIALYSIS
20.0000 [IU]/kg | INTRAMUSCULAR | Status: DC | PRN
  Administered 2024-03-29: 1700 [IU] via INTRAVENOUS_CENTRAL
  Filled 2024-03-29: qty 2

## 2024-03-29 MED ORDER — PENTAFLUOROPROP-TETRAFLUOROETH EX AERO: 1.0000 | INHALATION_SPRAY | CUTANEOUS | Status: DC | PRN

## 2024-03-29 NOTE — ED Provider Notes (Cosign Needed)
 [place] EMERGENCY DEPARTMENT AT [facility] Provider Note   CSN: [id number] Arrival date & time: 03/29/24  9295     Patient presents with: here for dialysis   Paul [name] is a 58 y.o. male who presents to the ED today for dialysis treatment.  Normally on a Monday Wednesday Friday treatment schedule, most recently here on Friday, 26 March 2024 for same.  Received dialysis without complication.  He does have some shortness of breath today, denies having any cough, and upon presentation is resting comfortably and a chair.  States that this began this morning but he does not endorse any cough, does not endorse any pain, and is not in any apparent distress.   HPI     Prior to Admission medications   Medication Sig Start Date End Date Taking? Authorizing Provider  ARIPiprazole  ER (ABILIFY  MAINTENA) 400 MG PRSY prefilled syringe Inject 400 mg into the muscle every 28 (twenty-eight) days. 03/24/24   Plovsky, Elna, MD  carvedilol  (COREG ) 6.25 MG tablet Take 1 tablet (6.25 mg total) by mouth 2 (two) times daily with a meal. 01/13/24   Delbert Clam, MD  isosorbide  mononitrate (IMDUR ) 30 MG 24 hr tablet Take 1 tablet (30 mg total) by mouth daily. 10/25/23   [name], MD  OVER THE COUNTER MEDICATION Take 1 capsule by mouth daily. Lion[employer]ane for memory    [provider]  rosuvastatin  (CRESTOR ) 5 MG tablet Take 1 tablet (5 mg total) by mouth daily. 03/24/23     sucroferric oxyhydroxide (VELPHORO ) 500 MG chewable tablet Chew 3 tablets (1,500 mg total) by mouth 3 (three) times daily with meals. Patient not taking: No sig reported 07/29/23   Tawkaliyar, Roya, DO    Allergies: Penicillin g    Review of Systems  Respiratory:  Positive for shortness of breath.   All other systems reviewed and are negative.   Updated Vital Signs BP (!) 159/96 (BP Location: Right Arm)   Pulse 67   Temp (!) 97.5 F (36.4 C) (Oral)   Resp 16   Ht 5' 3 (1.6 m)   Wt 86.2  kg   SpO2 100%   BMI 33.66 kg/m   Physical Exam Vitals and nursing note reviewed.  Constitutional:      General: He is awake. He is not in acute distress.    Appearance: Normal appearance. He is well-developed, well-groomed and overweight.  HENT:     Head: Normocephalic and atraumatic.  Eyes:     Conjunctiva/sclera: Conjunctivae normal.     Comments: Noted nonfunctional right eye.  Cardiovascular:     Rate and Rhythm: Normal rate and regular rhythm.     Heart sounds: No murmur heard. Pulmonary:     Effort: Pulmonary effort is normal. No respiratory distress.     Breath sounds: Normal breath sounds and air entry.     Comments: Good air entry at all fields with no adventitious sounds appreciated. Abdominal:     Palpations: Abdomen is soft.     Tenderness: There is no abdominal tenderness.  Musculoskeletal:        General: No swelling.     Cervical back: Neck supple.  Skin:    General: Skin is warm and dry.     Capillary Refill: Capillary refill takes less than 2 seconds.  Neurological:     Mental Status: He is alert.  Psychiatric:        Mood and Affect: Mood normal.  Behavior: Behavior is cooperative.     (all labs ordered are listed, but only abnormal results are displayed) Labs Reviewed - No data to display  EKG: None  Radiology: No results found.   Procedures   Medications Ordered in the ED  Chlorhexidine  Gluconate Cloth 2 % PADS 6 each (has no administration in time range)                                    Medical Decision Making  Consultation to urology for dialysis treatment was placed.  After assessment of the patient, he has good air entry to all lung fields with no adventitious sounds appreciated believe his shortness of breath is coming from likely fluid overload secondary to his chronic kidney disease.  Will monitor his pulse oximetry while here in the ED, and await transfer for his regular scheduled dialysis treatment.  Vital signs are  otherwise stable.  Consulted with nephrology who will bring patient in for his regular dialysis treatment.     Final diagnoses:  Encounter for dialysis    ED Discharge Orders     None          Myriam Dorn BROCKS, [place] 03/29/24 [phone]

## 2024-03-29 NOTE — ED Provider Notes (Signed)
 I provided a substantive portion of the care of this patient.  I personally made/approved the management plan for this patient and take responsibility for the patient management.     Is here for dialysis.  He reports he is has been getting slightly short of breath but is feeling okay at this time.  Patient is alert.  Clear mental status.  Supine in the stretcher at rest does not have significant shortness of breath.  Agree plan of management.   Armenta Canning, MD 03/29/24 [phone]

## 2024-03-29 NOTE — Progress Notes (Signed)
 Referral submitted to [facility] admissions today with request for [facility]. Referral also submitted to [facility] for clinics in [place], [place], and [place]. Met with pt at bedside while receiving HD. Pt aware that referrals have been submitted to [facility] and [facility] for review. Pt also advised that letter from psychiatrist was provided to dialysis providers when referrals were submitted. Pt agreeable to this plan. Pt states he can arrange transportation to HD clinic outside of [facility] if pt is approved for clinic outside of [facility]. Will await determination from out-pt HD providers.   Randine Mungo Dialysis Navigator [phone]

## 2024-03-29 NOTE — Progress Notes (Signed)
 Received patient in bed to unit.  Alert and oriented.  Informed consent signed and in chart.   TX duration:3.5 hours...patient rhonchi still there at end of dialysis but improved.    Patient tolerated well.  Transported back to the room  Alert, without acute distress.  Hand-off given to patient's nurse.   Access used: Right Upper arm graft Access issues: BFR to 350 due to high venous pressures in last hour.  Total UF removed: 2.5L   03/29/24 1725  Vitals  Temp (!) 97.4 F (36.3 C)  Temp Source Oral  BP (!) 168/87  MAP (mmHg) 112  Pulse Rate 72  ECG Heart Rate 73  Resp 14  Weight 82.2 kg  Type of Weight Post-Dialysis  Oxygen Therapy  SpO2 100 %  O2 Device Room Air  During Treatment Monitoring  Duration of HD Treatment -hour(s) 3.5 hour(s)  HD Safety Checks Performed Yes  Intra-Hemodialysis Comments Tx completed  Post Treatment  Dialyzer Clearance Lightly streaked  Liters Processed 81  Fluid Removed (mL) 2500 mL  Tolerated HD Treatment Yes  Post-Hemodialysis Comments Heparin  used for patency, patient almost clotted off at the end, BFR to 350  AVG/AVF Arterial Site Held (minutes) 7 minutes  AVG/AVF Venous Site Held (minutes) 7 minutes  Fistula / Graft Right Upper arm Arteriovenous fistula  No placement date or time found.   Placed prior to admission: Yes  Orientation: Right  Access Location: Upper arm  Access Type: Arteriovenous fistula  Site Condition No complications  Fistula / Graft Assessment Present;Thrill;Bruit  Status Patent;Deaccessed  Drainage Description None     Camellia Brasil LPN [facility]

## 2024-03-29 NOTE — Procedures (Signed)
 I was present at this dialysis session. I have reviewed the session itself and made appropriate changes.  K has been elevated so will use 1K bath for 30 minutes and recheck K levels. jac  Vital signs in last 24 hours:  Temp:  [97.4 F (36.3 C)-97.8 F (36.6 C)] 97.4 F (36.3 C) (11/24 1327) Pulse Rate:  [64-74] 74 (11/24 1402) Resp:  [16-19] 17 (11/24 1402) BP: (144-170)/(92-100) 144/100 (11/24 1402) SpO2:  [96 %-100 %] 100 % (11/24 1402) Weight:  [86.2 kg-87.7 kg] 87.7 kg (11/24 1327) Weight change:  Filed Weights   03/29/24 0710 03/29/24 1327  Weight: 86.2 kg 87.7 kg    Recent Labs  Lab 03/22/24 1427 03/26/24 1137 03/26/24 1309  NA 137   < > 139  K 4.3   < > 6.2*  CL 100   < > 102  CO2 21*  --  20*  GLUCOSE 93   < > 89  BUN 69*   < > 78*  CREATININE 11.89*   < > 14.18*  CALCIUM  7.2*  --  7.4*  PHOS 9.8*  --   --    < > = values in this interval not displayed.    Recent Labs  Lab 03/22/24 1427 03/26/24 1137 03/29/24 1330  WBC 6.1  --  6.0  NEUTROABS 3.8  --   --   HGB 9.3* 9.2* 9.1*  HCT 28.2* 27.0* 27.5*  MCV 91.6  --  90.5  PLT 238  --  235    Scheduled Meds:  Chlorhexidine  Gluconate Cloth  6 each Topical Q0600   Continuous Infusions:  anticoagulant sodium citrate      PRN Meds:.alteplase , anticoagulant sodium citrate , feeding supplement (NEPRO CARB STEADY), heparin , heparin , lidocaine  (PF), lidocaine -prilocaine , pentafluoroprop-tetrafluoroeth   Fairy DELENA Sellar,  MD 03/29/2024, 2:03 PM

## 2024-03-29 NOTE — ED Notes (Signed)
 To dialysis NOW

## 2024-03-29 NOTE — ED Triage Notes (Signed)
 Here for dailysis , last dialysis was friday

## 2024-03-30 LAB — HEPATITIS B SURFACE ANTIBODY, QUANTITATIVE: Hep B S AB Quant (Post): 6393 m[IU]/mL

## 2024-03-31 ENCOUNTER — Other Ambulatory Visit: Payer: Self-pay

## 2024-03-31 ENCOUNTER — Emergency Department (HOSPITAL_COMMUNITY)
Admission: EM | Admit: 2024-03-31 | Discharge: 2024-03-31 | Disposition: A | Discharge status: Home / Self Care | Attending: Emergency Medicine | Admitting: Emergency Medicine

## 2024-03-31 ENCOUNTER — Encounter (HOSPITAL_COMMUNITY): Payer: Self-pay | Admitting: Emergency Medicine

## 2024-03-31 DIAGNOSIS — Z79899 Other long term (current) drug therapy: Secondary | ICD-10-CM | POA: Diagnosis not present

## 2024-03-31 DIAGNOSIS — I132 Hypertensive heart and chronic kidney disease with heart failure and with stage 5 chronic kidney disease, or end stage renal disease: Secondary | ICD-10-CM | POA: Insufficient documentation

## 2024-03-31 DIAGNOSIS — Z992 Dependence on renal dialysis: Secondary | ICD-10-CM | POA: Diagnosis not present

## 2024-03-31 DIAGNOSIS — I120 Hypertensive chronic kidney disease with stage 5 chronic kidney disease or end stage renal disease: Secondary | ICD-10-CM | POA: Diagnosis not present

## 2024-03-31 DIAGNOSIS — I509 Heart failure, unspecified: Secondary | ICD-10-CM | POA: Diagnosis not present

## 2024-03-31 DIAGNOSIS — N186 End stage renal disease: Secondary | ICD-10-CM | POA: Diagnosis not present

## 2024-03-31 DIAGNOSIS — R0602 Shortness of breath: Secondary | ICD-10-CM | POA: Diagnosis not present

## 2024-03-31 LAB — RENAL FUNCTION PANEL
Albumin: 3.4 g/dL — ABNORMAL LOW (ref 3.5–5.0)
Anion gap: 16 — ABNORMAL HIGH (ref 5–15)
BUN: 57 mg/dL — ABNORMAL HIGH (ref 6–20)
CO2: 22 mmol/L (ref 22–32)
Calcium: 7.7 mg/dL — ABNORMAL LOW (ref 8.9–10.3)
Chloride: 99 mmol/L (ref 98–111)
Creatinine, Ser: 12.4 mg/dL — ABNORMAL HIGH (ref 0.61–1.24)
GFR, Estimated: 4 mL/min — ABNORMAL LOW (ref >60)
Glucose, Bld: 91 mg/dL (ref 70–99)
Phosphorus: 9.2 mg/dL — ABNORMAL HIGH (ref 2.5–4.6)
Potassium: 4.4 mmol/L (ref 3.5–5.1)
Sodium: 137 mmol/L (ref 135–145)

## 2024-03-31 LAB — CBC
HCT: 28.7 % — ABNORMAL LOW (ref 39.0–52.0)
Hemoglobin: 9.6 g/dL — ABNORMAL LOW (ref 13.0–17.0)
MCH: 30.1 pg (ref 26.0–34.0)
MCHC: 33.4 g/dL (ref 30.0–36.0)
MCV: 90 fL (ref 80.0–100.0)
Platelets: 225 K/uL (ref 150–400)
RBC: 3.19 MIL/uL — ABNORMAL LOW (ref 4.22–5.81)
RDW: 17.2 % — ABNORMAL HIGH (ref 11.5–15.5)
WBC: 5.8 K/uL (ref 4.0–10.5)
nRBC: 0 % (ref 0.0–0.2)

## 2024-03-31 MED ORDER — HEPARIN SODIUM (PORCINE) 1000 UNIT/ML DIALYSIS
2400.0000 [IU] | Freq: Once | INTRAMUSCULAR | Status: DC
  Filled 2024-03-31: qty 3

## 2024-03-31 MED ORDER — HEPARIN SODIUM (PORCINE) 1000 UNIT/ML IJ SOLN
INTRAMUSCULAR | Status: AC
  Filled 2024-03-31: qty 3

## 2024-03-31 MED ORDER — CHLORHEXIDINE GLUCONATE CLOTH 2 % EX PADS: 6.0000 | MEDICATED_PAD | Freq: Every day | CUTANEOUS | Status: DC

## 2024-03-31 MED ORDER — PENTAFLUOROPROP-TETRAFLUOROETH EX AERO: 1.0000 | INHALATION_SPRAY | CUTANEOUS | Status: DC | PRN

## 2024-03-31 NOTE — ED Provider Notes (Signed)
 The patient come back from dialysis and left prior to reevaluation.   Paul Prentice SAUNDERS, MD 03/31/24 [phone]

## 2024-03-31 NOTE — ED Provider Notes (Signed)
 [place] EMERGENCY DEPARTMENT AT [facility] Provider Note   CSN: [id number] Arrival date & time: 03/31/24  9341     Patient presents with: No chief complaint on file.   Paul [name] is a 58 y.o. male with PMHx CHF, ESRD on dialysis MWF, HTN who presents to ED for dialysis. Patient is well known to ED. Last dialysis session was 2 days ago. Patient endorsing SOB but states that it is chronic for him. Denies fever, chest pain, cough, nausea, vomiting, diarrhea.   HPI     Prior to Admission medications   Medication Sig Start Date End Date Taking? Authorizing Provider  ARIPiprazole  ER (ABILIFY  MAINTENA) 400 MG PRSY prefilled syringe Inject 400 mg into the muscle every 28 (twenty-eight) days. 03/24/24   Plovsky, Elna, MD  carvedilol  (COREG ) 6.25 MG tablet Take 1 tablet (6.25 mg total) by mouth 2 (two) times daily with a meal. 01/13/24   Delbert Clam, MD  isosorbide  mononitrate (IMDUR ) 30 MG 24 hr tablet Take 1 tablet (30 mg total) by mouth daily. 10/25/23   [name], MD  OVER THE COUNTER MEDICATION Take 1 capsule by mouth daily. Lion[employer] Mane for memory    [provider]  rosuvastatin  (CRESTOR ) 5 MG tablet Take 1 tablet (5 mg total) by mouth daily. 03/24/23     sucroferric oxyhydroxide (VELPHORO ) 500 MG chewable tablet Chew 3 tablets (1,500 mg total) by mouth 3 (three) times daily with meals. Patient not taking: No sig reported 07/29/23   Heddy Barren, DO    Allergies: Penicillin g    Review of Systems  Constitutional:        Dialysis    Updated Vital Signs BP (!) 171/98 (BP Location: Left Arm)   Pulse 77   Temp 97.6 F (36.4 C)   Resp 16   Ht 5' 3 (1.6 m)   Wt 82.2 kg   SpO2 100%   BMI 32.10 kg/m   Physical Exam Vitals and nursing note reviewed.  Constitutional:      General: He is not in acute distress.    Appearance: He is not ill-appearing or toxic-appearing.  HENT:     Head: Normocephalic and atraumatic.      Mouth/Throat:     Mouth: Mucous membranes are moist.  Eyes:     General: No scleral icterus.       Right eye: No discharge.        Left eye: No discharge.     Conjunctiva/sclera: Conjunctivae normal.  Cardiovascular:     Rate and Rhythm: Normal rate and regular rhythm.     Pulses: Normal pulses.     Heart sounds: Normal heart sounds. No murmur heard. Pulmonary:     Effort: Pulmonary effort is normal. No respiratory distress.     Breath sounds: Normal breath sounds. No wheezing, rhonchi or rales.  Abdominal:     General: Abdomen is flat.  Musculoskeletal:     Right lower leg: No edema.     Left lower leg: No edema.  Skin:    General: Skin is warm and dry.     Findings: No rash.  Neurological:     General: No focal deficit present.     Mental Status: He is alert and oriented to person, place, and time. Mental status is at baseline.  Psychiatric:        Mood and Affect: Mood normal.        Behavior: Behavior normal.     (all labs  ordered are listed, but only abnormal results are displayed) Labs Reviewed - No data to display  EKG: None  Radiology: No results found.   Procedures   Medications Ordered in the ED - No data to display                                  Medical Decision Making  This patient presents to the ED for concern of dialysis, this involves an extensive number of treatment options, and is a complaint that carries with it a high risk of complications and morbidity.  The differential diagnosis includes ESRD on dialysis, fluid overload, etc.   Co morbidities that complicate the patient evaluation  CHF, ESRD on dialysis MWF, HTN    Additional history obtained:  Additional history obtained from 11/24 ED note: patient presenting for dialysis.    Problem List / ED Course / Critical interventions / Medication management  Patient presents to ED requesting his Wednesday session of dialysis.  Denies any acute complaints today.  Physical exam  reassuring.  Patient afebrile with stable vitals. I requested consultation with the neurologist on-call Dr. Rayburn,  and discussed lab and imaging findings as well as pertinent plan - they agreed to take patient in for dialysis. Patient will come back to ED after dialysis session. At this time, patient is well-appearing and in stable condition.  I expect discharge if dialysis is uncomplicated today. I have reviewed the patients home medicines and have made adjustments as needed   Social Determinants of Health:  dialysis      Final diagnoses:  ESRD on dialysis [facility])    ED Discharge Orders     None          Hoy Nidia FALCON, [place] 03/31/24 9192    Francesca Elsie CROME, MD 03/31/24 1330

## 2024-03-31 NOTE — Procedures (Signed)
 I was present at this dialysis session. I have reviewed the session itself and made appropriate changes.   Vital signs in last 24 hours:  Temp:  [97.6 F (36.4 C)-98.6 F (37 C)] 98.6 F (37 C) (11/26 1200) Pulse Rate:  [72-84] 84 (11/26 1300) Resp:  [12-17] 16 (11/26 1300) BP: (104-171)/(59-98) 117/86 (11/26 1300) SpO2:  [95 %-100 %] 97 % (11/26 1300) Weight:  [82.2 kg-85.6 kg] 85.6 kg (11/26 1002) Weight change:  Filed Weights   03/31/24 0709 03/31/24 1002  Weight: 82.2 kg 85.6 kg    Recent Labs  Lab 03/31/24 1003  NA 137  K 4.4  CL 99  CO2 22  GLUCOSE 91  BUN 57*  CREATININE 12.40*  CALCIUM  7.7*  PHOS 9.2*    Recent Labs  Lab 03/26/24 1137 03/29/24 1330 03/31/24 1003  WBC  --  6.0 5.8  HGB 9.2* 9.1* 9.6*  HCT 27.0* 27.5* 28.7*  MCV  --  90.5 90.0  PLT  --  235 225    Scheduled Meds:  Chlorhexidine  Gluconate Cloth  6 each Topical Q0600   heparin   2,400 Units Dialysis Once in dialysis   Continuous Infusions: PRN Meds:.pentafluoroprop-tetrafluoroeth   Paul DELENA Sellar,  MD 03/31/2024, 1:29 PM

## 2024-03-31 NOTE — ED Triage Notes (Signed)
 Pt here for routine dialysis. Last tx Monday. Denies any complaints.

## 2024-03-31 NOTE — Procedures (Signed)
 HD Note:  Some information was entered later than the data was gathered due to patient care needs. The stated time with the data is accurate.  Received patient on stretcher from the ED to unit.  Patient orders read to be returned to the ED post treatment for discharge  Alert and oriented.   Informed consent signed and in chart.   Access used: Upper right arm fistula Access issues: None  Patient tolerated treatment well.   TX duration: 3.5 hours  Alert, without acute distress.  Total UF removed: 2000 ml  Hand-off given to patient's nurse.   Transported back to the room   Thamar Holik L. Lenon, RN Kid[facility] Dialysis Unit.

## 2024-03-31 NOTE — Progress Notes (Addendum)
 Pt has been denied by [facility] for any area clinic and [facility] has cancelled pt's referral. Referral for [facility] is currently pending. Will await determination.   Randine Mungo Dialysis Navigator [phone]  Addendum at 2:21 pm: Contacted by [facility] admissions to say that pt was an area denial due to no provider being willing to accept pt. [facility] cancelled referral. Navigator was advised that new [facility] clinic in [place] may be a possible option when clinic is able to accept pt's insurance. Will plan to submit another referral for that clinic once clinic can take pt's insurance. Met with pt at bedside while on HD unit. Pt made aware of the above info. Encouraged pt to continue doing what he is doing- going to appts, taking meds, coming to HD here,and going to support group meetings.

## 2024-04-02 ENCOUNTER — Encounter (HOSPITAL_COMMUNITY): Payer: Self-pay

## 2024-04-02 ENCOUNTER — Other Ambulatory Visit: Payer: Self-pay

## 2024-04-02 ENCOUNTER — Emergency Department (HOSPITAL_COMMUNITY)
Admission: EM | Admit: 2024-04-02 | Discharge: 2024-04-02 | Discharge status: Against Medical Advice | Attending: Emergency Medicine | Admitting: Emergency Medicine

## 2024-04-02 DIAGNOSIS — Z992 Dependence on renal dialysis: Secondary | ICD-10-CM | POA: Diagnosis not present

## 2024-04-02 DIAGNOSIS — I120 Hypertensive chronic kidney disease with stage 5 chronic kidney disease or end stage renal disease: Secondary | ICD-10-CM | POA: Diagnosis not present

## 2024-04-02 DIAGNOSIS — Z79899 Other long term (current) drug therapy: Secondary | ICD-10-CM | POA: Insufficient documentation

## 2024-04-02 DIAGNOSIS — N186 End stage renal disease: Secondary | ICD-10-CM | POA: Insufficient documentation

## 2024-04-02 DIAGNOSIS — I509 Heart failure, unspecified: Secondary | ICD-10-CM | POA: Insufficient documentation

## 2024-04-02 DIAGNOSIS — I132 Hypertensive heart and chronic kidney disease with heart failure and with stage 5 chronic kidney disease, or end stage renal disease: Secondary | ICD-10-CM | POA: Diagnosis not present

## 2024-04-02 LAB — RENAL FUNCTION PANEL
Albumin: 3.4 g/dL — ABNORMAL LOW (ref 3.5–5.0)
Anion gap: 19 — ABNORMAL HIGH (ref 5–15)
BUN: 67 mg/dL — ABNORMAL HIGH (ref 6–20)
CO2: 20 mmol/L — ABNORMAL LOW (ref 22–32)
Calcium: 7.4 mg/dL — ABNORMAL LOW (ref 8.9–10.3)
Chloride: 101 mmol/L (ref 98–111)
Creatinine, Ser: 11.9 mg/dL — ABNORMAL HIGH (ref 0.61–1.24)
GFR, Estimated: 4 mL/min — ABNORMAL LOW (ref >60)
Glucose, Bld: 87 mg/dL (ref 70–99)
Phosphorus: 9 mg/dL — ABNORMAL HIGH (ref 2.5–4.6)
Potassium: 4.5 mmol/L (ref 3.5–5.1)
Sodium: 140 mmol/L (ref 135–145)

## 2024-04-02 LAB — CBC WITH DIFFERENTIAL/PLATELET
Abs Immature Granulocytes: 0.04 K/uL (ref 0.00–0.07)
Basophils Absolute: 0 K/uL (ref 0.0–0.1)
Basophils Relative: 1 %
Eosinophils Absolute: 0.3 K/uL (ref 0.0–0.5)
Eosinophils Relative: 4 %
HCT: 28.2 % — ABNORMAL LOW (ref 39.0–52.0)
Hemoglobin: 9.1 g/dL — ABNORMAL LOW (ref 13.0–17.0)
Immature Granulocytes: 1 %
Lymphocytes Relative: 20 %
Lymphs Abs: 1.4 K/uL (ref 0.7–4.0)
MCH: 29.6 pg (ref 26.0–34.0)
MCHC: 32.3 g/dL (ref 30.0–36.0)
MCV: 91.9 fL (ref 80.0–100.0)
Monocytes Absolute: 0.8 K/uL (ref 0.1–1.0)
Monocytes Relative: 11 %
Neutro Abs: 4.5 K/uL (ref 1.7–7.7)
Neutrophils Relative %: 63 %
Platelets: 219 K/uL (ref 150–400)
RBC: 3.07 MIL/uL — ABNORMAL LOW (ref 4.22–5.81)
RDW: 17.3 % — ABNORMAL HIGH (ref 11.5–15.5)
WBC: 7 K/uL (ref 4.0–10.5)
nRBC: 0 % (ref 0.0–0.2)

## 2024-04-02 MED ORDER — CHLORHEXIDINE GLUCONATE CLOTH 2 % EX PADS: 6.0000 | MEDICATED_PAD | Freq: Every day | CUTANEOUS | Status: DC

## 2024-04-02 MED ORDER — ALBUTEROL SULFATE (2.5 MG/3ML) 0.083% IN NEBU
INHALATION_SOLUTION | RESPIRATORY_TRACT | Status: AC
Start: 2024-04-02 — End: 2024-04-02
  Filled 2024-04-02: qty 3

## 2024-04-02 MED ORDER — ALBUTEROL SULFATE (2.5 MG/3ML) 0.083% IN NEBU
2.5000 mg | INHALATION_SOLUTION | Freq: Once | RESPIRATORY_TRACT | Status: AC
  Administered 2024-04-02: 2.5 mg via RESPIRATORY_TRACT

## 2024-04-02 NOTE — ED Notes (Signed)
 Patient transported to dialysis. This RN spoke to dialysis RN, who had no questions or concerns prior to transfer. Care transferred at this time.

## 2024-04-02 NOTE — ED Provider Notes (Signed)
 [place] EMERGENCY DEPARTMENT AT [facility] Provider Note   CSN: [id number] Arrival date & time: 04/02/24  9374     Patient presents with: Vascular Access Problem   Paul [name] is a 58 y.o. male PMHx CHF, ESRD on dialysis MWF, HTN who presents to ED for dialysis.  Last dialysis session was on 03-31-2024.  Denies any acute changes for himself.  Denies any chest pain, fever, or cough.  Reports he has some shortness of breath but this is chronic for him and unchanged.    HPI     Prior to Admission medications   Medication Sig Start Date End Date Taking? Authorizing Provider  ARIPiprazole  ER (ABILIFY  MAINTENA) 400 MG PRSY prefilled syringe Inject 400 mg into the muscle every 28 (twenty-eight) days. 03/24/24   Plovsky, Elna, MD  carvedilol  (COREG ) 6.25 MG tablet Take 1 tablet (6.25 mg total) by mouth 2 (two) times daily with a meal. 01/13/24   Delbert Clam, MD  isosorbide  mononitrate (IMDUR ) 30 MG 24 hr tablet Take 1 tablet (30 mg total) by mouth daily. 10/25/23   [name], MD  OVER THE COUNTER MEDICATION Take 1 capsule by mouth daily. Lion's Mane for memory    [provider]  rosuvastatin  (CRESTOR ) 5 MG tablet Take 1 tablet (5 mg total) by mouth daily. 03/24/23     sucroferric oxyhydroxide (VELPHORO ) 500 MG chewable tablet Chew 3 tablets (1,500 mg total) by mouth 3 (three) times daily with meals. Patient not taking: No sig reported 07/29/23   Tawkaliyar, Roya, DO    Allergies: Penicillin g    Review of Systems  Constitutional:  Negative for fever.  Respiratory:  Negative for cough.   Cardiovascular:  Negative for chest pain.    Updated Vital Signs BP (!) 144/91 (BP Location: Right Arm)   Pulse 81   Temp 97.7 F (36.5 C)   Resp 20   Ht 5' 3 (1.6 m)   Wt 85.6 kg   SpO2 93%   BMI 33.43 kg/m   Physical Exam Vitals and nursing note reviewed.  Constitutional:      General: He is not in acute distress.    Appearance: He is not  ill-appearing or toxic-appearing.  HENT:     Mouth/Throat:     Mouth: Mucous membranes are moist.  Pulmonary:     Effort: Pulmonary effort is normal. No respiratory distress.  Skin:    General: Skin is dry.  Neurological:     Mental Status: He is alert.  Psychiatric:        Mood and Affect: Mood normal.     (all labs ordered are listed, but only abnormal results are displayed) Labs Reviewed - No data to display  EKG: None  Radiology: No results found.  Procedures   Medications Ordered in the ED - No data to display    Medical Decision Making  58 y.o. male presents to the ER for dialysis.  Patient is well-known to the emergency room and multiple visits for previous presentations.  This is his established dialysis center while getting him placed.  Vital signs mildly elevated blood pressure otherwise unremarkable. Physical exam as noted above.   Nephrology consulted.  Spoke with Dr. Marlee who will arrange dialysis for the patient.  9:13 AM Patient to dialysis.   Portions of this report may have been transcribed using voice recognition software. Every effort was made to ensure accuracy; however, inadvertent computerized transcription errors may be present.    Final  diagnoses:  ESRD (end stage renal disease) [facility])    ED Discharge Orders     None          Bernis Ernst, PA-C 04/02/24 1628    Yolande Lamar BROCKS, MD 04/03/24 [phone]

## 2024-04-02 NOTE — ED Notes (Addendum)
 Patient resting comfortably on stretcher with eyes closed at this time. Respirations even and unlabored, no acute distress.

## 2024-04-02 NOTE — ED Triage Notes (Signed)
 Pt bib pov c/o dialysis tx. Pt denies any pain at this time.

## 2024-04-02 NOTE — Procedures (Signed)
 I was present at this dialysis session. I have reviewed the session itself and made appropriate changes.   Vital signs in last 24 hours:  Temp:  [97 F (36.1 C)-97.7 F (36.5 C)] 97 F (36.1 C) (11/28 0927) Pulse Rate:  [68-81] 81 (11/28 0931) Resp:  [14-20] 14 (11/28 0931) BP: (127-144)/(81-91) 132/81 (11/28 0931) SpO2:  [93 %-100 %] 100 % (11/28 0931) Weight:  [85.6 kg-86.1 kg] 86.1 kg (11/28 0927) Weight change:  Filed Weights   04/02/24 0634 04/02/24 0927  Weight: 85.6 kg 86.1 kg    Recent Labs  Lab 03/31/24 1003  NA 137  K 4.4  CL 99  CO2 22  GLUCOSE 91  BUN 57*  CREATININE 12.40*  CALCIUM  7.7*  PHOS 9.2*    Recent Labs  Lab 03/26/24 1137 03/29/24 1330 03/31/24 1003  WBC  --  6.0 5.8  HGB 9.2* 9.1* 9.6*  HCT 27.0* 27.5* 28.7*  MCV  --  90.5 90.0  PLT  --  235 225    Scheduled Meds:  Chlorhexidine  Gluconate Cloth  6 each Topical Q0600   Continuous Infusions: PRN Meds:.   Fairy DELENA Sellar,  MD 04/02/2024, 9:53 AM

## 2024-04-02 NOTE — ED Provider Notes (Signed)
 Patient reportedly left after dialysis.  He has not been here in the emergency department recently but I was not told there was any problem or distress.  Suspect he eloped after his treatment today.   Avari Gelles, Lonni PARAS, MD 04/02/24 [phone]

## 2024-04-02 NOTE — Progress Notes (Signed)
 2 liters ultrafiltration, condition stable post hemodialysis, and report was given to the ED RN Burnard Feeling and this patient is to be transported to [place] bed 13 Post hemodialysis.

## 2024-04-07 ENCOUNTER — Emergency Department (HOSPITAL_COMMUNITY)

## 2024-04-07 ENCOUNTER — Other Ambulatory Visit: Payer: Self-pay

## 2024-04-07 ENCOUNTER — Emergency Department (HOSPITAL_COMMUNITY): Admission: EM | Admit: 2024-04-07 | Discharge: 2024-04-07 | Disposition: A | Discharge status: Home / Self Care

## 2024-04-07 DIAGNOSIS — R0602 Shortness of breath: Secondary | ICD-10-CM | POA: Insufficient documentation

## 2024-04-07 DIAGNOSIS — Z940 Kidney transplant status: Secondary | ICD-10-CM | POA: Insufficient documentation

## 2024-04-07 DIAGNOSIS — I517 Cardiomegaly: Secondary | ICD-10-CM | POA: Diagnosis not present

## 2024-04-07 DIAGNOSIS — N189 Chronic kidney disease, unspecified: Secondary | ICD-10-CM | POA: Insufficient documentation

## 2024-04-07 DIAGNOSIS — I129 Hypertensive chronic kidney disease with stage 1 through stage 4 chronic kidney disease, or unspecified chronic kidney disease: Secondary | ICD-10-CM | POA: Insufficient documentation

## 2024-04-07 LAB — CBC WITH DIFFERENTIAL/PLATELET
Abs Immature Granulocytes: 0.05 K/uL (ref 0.00–0.07)
Basophils Absolute: 0 K/uL (ref 0.0–0.1)
Basophils Relative: 1 %
Eosinophils Absolute: 0.3 K/uL (ref 0.0–0.5)
Eosinophils Relative: 4 %
HCT: 25.3 % — ABNORMAL LOW (ref 39.0–52.0)
Hemoglobin: 8.4 g/dL — ABNORMAL LOW (ref 13.0–17.0)
Immature Granulocytes: 1 %
Lymphocytes Relative: 15 %
Lymphs Abs: 1.2 K/uL (ref 0.7–4.0)
MCH: 29.7 pg (ref 26.0–34.0)
MCHC: 33.2 g/dL (ref 30.0–36.0)
MCV: 89.4 fL (ref 80.0–100.0)
Monocytes Absolute: 0.7 K/uL (ref 0.1–1.0)
Monocytes Relative: 9 %
Neutro Abs: 5.5 K/uL (ref 1.7–7.7)
Neutrophils Relative %: 70 %
Platelets: 205 K/uL (ref 150–400)
RBC: 2.83 MIL/uL — ABNORMAL LOW (ref 4.22–5.81)
RDW: 17.6 % — ABNORMAL HIGH (ref 11.5–15.5)
WBC: 7.8 K/uL (ref 4.0–10.5)
nRBC: 0 % (ref 0.0–0.2)

## 2024-04-07 LAB — I-STAT CHEM 8, ED
BUN: 91 mg/dL — ABNORMAL HIGH (ref 6–20)
Calcium, Ion: 0.75 mmol/L — CL (ref 1.15–1.40)
Chloride: 106 mmol/L (ref 98–111)
Creatinine, Ser: 17.8 mg/dL — ABNORMAL HIGH (ref 0.61–1.24)
Glucose, Bld: 81 mg/dL (ref 70–99)
HCT: 24 % — ABNORMAL LOW (ref 39.0–52.0)
Hemoglobin: 8.2 g/dL — ABNORMAL LOW (ref 13.0–17.0)
Potassium: 6 mmol/L — ABNORMAL HIGH (ref 3.5–5.1)
Sodium: 139 mmol/L (ref 135–145)
TCO2: 17 mmol/L — ABNORMAL LOW (ref 22–32)

## 2024-04-07 LAB — BASIC METABOLIC PANEL WITH GFR
Anion gap: 22 — ABNORMAL HIGH (ref 5–15)
BUN: 90 mg/dL — ABNORMAL HIGH (ref 6–20)
CO2: 16 mmol/L — ABNORMAL LOW (ref 22–32)
Calcium: 6.2 mg/dL — CL (ref 8.9–10.3)
Chloride: 104 mmol/L (ref 98–111)
Creatinine, Ser: 16.33 mg/dL — ABNORMAL HIGH (ref 0.61–1.24)
GFR, Estimated: 3 mL/min — ABNORMAL LOW (ref >60)
Glucose, Bld: 105 mg/dL — ABNORMAL HIGH (ref 70–99)
Potassium: 5.1 mmol/L (ref 3.5–5.1)
Sodium: 142 mmol/L (ref 135–145)

## 2024-04-07 LAB — CBC
HCT: 25.8 % — ABNORMAL LOW (ref 39.0–52.0)
Hemoglobin: 8.7 g/dL — ABNORMAL LOW (ref 13.0–17.0)
MCH: 30.3 pg (ref 26.0–34.0)
MCHC: 33.7 g/dL (ref 30.0–36.0)
MCV: 89.9 fL (ref 80.0–100.0)
Platelets: 199 K/uL (ref 150–400)
RBC: 2.87 MIL/uL — ABNORMAL LOW (ref 4.22–5.81)
RDW: 17.6 % — ABNORMAL HIGH (ref 11.5–15.5)
WBC: 7 K/uL (ref 4.0–10.5)
nRBC: 0 % (ref 0.0–0.2)

## 2024-04-07 LAB — HEPATITIS B SURFACE ANTIGEN: Hepatitis B Surface Ag: NONREACTIVE

## 2024-04-07 MED ORDER — CALCIUM GLUCONATE-NACL 1-0.675 GM/50ML-% IV SOLN
1.0000 g | Freq: Once | INTRAVENOUS | Status: AC
  Administered 2024-04-07: 1000 mg via INTRAVENOUS
  Filled 2024-04-07: qty 50

## 2024-04-07 MED ORDER — CHLORHEXIDINE GLUCONATE CLOTH 2 % EX PADS: 6.0000 | MEDICATED_PAD | Freq: Every day | CUTANEOUS | Status: DC

## 2024-04-07 MED ORDER — ALBUTEROL SULFATE HFA 108 (90 BASE) MCG/ACT IN AERS
2.0000 | INHALATION_SPRAY | Freq: Once | RESPIRATORY_TRACT | Status: AC
  Administered 2024-04-07: 2 via RESPIRATORY_TRACT
  Filled 2024-04-07: qty 6.7

## 2024-04-07 NOTE — Discharge Instructions (Signed)
Follow up with your doctor in clinic

## 2024-04-07 NOTE — ED Provider Notes (Signed)
 [place] EMERGENCY DEPARTMENT AT [facility] Provider Note   CSN: [id number] Arrival date & time: 04/07/24  9356     Patient presents with: No chief complaint on file.   Paul [name] is a 58 y.o. male with past medical history significant for obesity, hypertension, ESRD on dialysis, schizophrenia who is here for routine dialysis. Endorses some wheezing / shortness of breath. Missed 12/1. Feels like he might have some extra fluid on.    HPI     Prior to Admission medications   Medication Sig Start Date End Date Taking? Authorizing Provider  ARIPiprazole  ER (ABILIFY  MAINTENA) 400 MG PRSY prefilled syringe Inject 400 mg into the muscle every 28 (twenty-eight) days. 03/24/24   Plovsky, Elna, MD  carvedilol  (COREG ) 6.25 MG tablet Take 1 tablet (6.25 mg total) by mouth 2 (two) times daily with a meal. 01/13/24   Delbert Clam, MD  isosorbide  mononitrate (IMDUR ) 30 MG 24 hr tablet Take 1 tablet (30 mg total) by mouth daily. 10/25/23   [name], MD  OVER THE COUNTER MEDICATION Take 1 capsule by mouth daily. Lion's Mane for memory    [provider]  rosuvastatin  (CRESTOR ) 5 MG tablet Take 1 tablet (5 mg total) by mouth daily. 03/24/23     sucroferric oxyhydroxide (VELPHORO ) 500 MG chewable tablet Chew 3 tablets (1,500 mg total) by mouth 3 (three) times daily with meals. Patient not taking: No sig reported 07/29/23   Heddy Barren, DO    Allergies: Penicillin g    Review of Systems  All other systems reviewed and are negative.   Updated Vital Signs BP (!) 151/91 (BP Location: Left Arm)   Pulse 73   Temp 97.6 F (36.4 C)   Resp 20   SpO2 99%   Physical Exam Vitals and nursing note reviewed.  Constitutional:      General: He is not in acute distress.    Appearance: Normal appearance.  HENT:     Head: Normocephalic and atraumatic.  Eyes:     General:        Right eye: No discharge.        Left eye: No discharge.  Cardiovascular:      Rate and Rhythm: Normal rate and regular rhythm.  Pulmonary:     Effort: Pulmonary effort is normal. No respiratory distress.     Comments: Mild wheezing, crackles at lung bases, somewhat increased work of breathing.  No stridor, no rales Musculoskeletal:        General: No deformity.  Skin:    General: Skin is warm and dry.  Neurological:     Mental Status: He is alert and oriented to person, place, and time.  Psychiatric:        Mood and Affect: Mood normal.        Behavior: Behavior normal.     (all labs ordered are listed, but only abnormal results are displayed) Labs Reviewed - No data to display  EKG: None  Radiology: No results found.   Procedures   Medications Ordered in the ED - No data to display                                  Medical Decision Making Amount and/or Complexity of Data Reviewed Labs: ordered. Radiology: ordered.  Risk Prescription drug management.   Well-known patient to the department here for routine dialysis, he is on a Monday Wednesday  Friday schedule. Will check some basic lab work, chest x-ray, EKG. Initially with some shortness of breath. I spoke with Dr. Norine with nephrology who accepts patient for dialysis.  Stable vital signs other than some hypertension, blood pressure 151/91. Suspect shob secondary to fluid overload.  Independently interpreted lab work, imaging CBC shows: Mild anemia, hgb 8.7, otherwise unremarkable.  BMP shows: Significantly elevated BUN, creatinine, 90, 16.33.  Fairly severe hypocalcemia, calcium  6.2, will check an ionized calcium .  Anion gap of 22 likely secondary to uremia.  Ionized calcium  0.75.  Will administer 1 g IV calcium  gluconate. DG chest portable with pulmonary edema, similar appearing cardiomegaly  Albuterol  for mild wheeze, although again suspect the patient's symptoms are more related to fluid overload in context of his known ESRD, and missed dialysis session.  Will go to dialysis and  discharge after the fact.   Final diagnoses:  None    ED Discharge Orders     None          Rosan Sherlean DEL, PA-C 04/07/24 [address], Roland L, DO 04/08/24 [phone]

## 2024-04-07 NOTE — ED Provider Notes (Signed)
 Patient returned from dialysis.  He says he still feels terrible.  No difficulty breathing on my objective exam.  Clear lung sounds for me.  Will discharge home.  PCP follow-up.   [name], DO 04/07/24 [phone]

## 2024-04-07 NOTE — Progress Notes (Signed)
 2.3 liters ultrafiltration, condition stable post hemodialysis, and report was given to the ED charge RN.

## 2024-04-07 NOTE — ED Triage Notes (Signed)
 C/o SHOB xfew days. Pt reports he missed dialysis MWF, missed Monday and most recent dialysis completed Friday. Audible wheezing, pt denies any pain. A&Ox4, 100% RA.

## 2024-04-07 NOTE — ED Notes (Signed)
 Pt transported to dialysis with dialysis RN

## 2024-04-07 NOTE — ED Notes (Signed)
 ED Provider at bedside.

## 2024-04-08 LAB — HEPATITIS B SURFACE ANTIBODY, QUANTITATIVE: Hep B S AB Quant (Post): 5929 m[IU]/mL

## 2024-04-09 ENCOUNTER — Other Ambulatory Visit: Payer: Self-pay

## 2024-04-09 ENCOUNTER — Encounter (HOSPITAL_COMMUNITY): Payer: Self-pay

## 2024-04-09 ENCOUNTER — Emergency Department (HOSPITAL_COMMUNITY)
Admission: EM | Admit: 2024-04-09 | Discharge: 2024-04-09 | Discharge status: Against Medical Advice | Attending: Emergency Medicine | Admitting: Emergency Medicine

## 2024-04-09 DIAGNOSIS — N186 End stage renal disease: Secondary | ICD-10-CM | POA: Insufficient documentation

## 2024-04-09 DIAGNOSIS — Z992 Dependence on renal dialysis: Secondary | ICD-10-CM | POA: Diagnosis not present

## 2024-04-09 DIAGNOSIS — I120 Hypertensive chronic kidney disease with stage 5 chronic kidney disease or end stage renal disease: Secondary | ICD-10-CM | POA: Diagnosis not present

## 2024-04-09 DIAGNOSIS — Z5329 Procedure and treatment not carried out because of patient's decision for other reasons: Secondary | ICD-10-CM | POA: Insufficient documentation

## 2024-04-09 LAB — RENAL FUNCTION PANEL
Albumin: 3.6 g/dL (ref 3.5–5.0)
Anion gap: 19 — ABNORMAL HIGH (ref 5–15)
BUN: 65 mg/dL — ABNORMAL HIGH (ref 6–20)
CO2: 20 mmol/L — ABNORMAL LOW (ref 22–32)
Calcium: 6.7 mg/dL — ABNORMAL LOW (ref 8.9–10.3)
Chloride: 100 mmol/L (ref 98–111)
Creatinine, Ser: 13.89 mg/dL — ABNORMAL HIGH (ref 0.61–1.24)
GFR, Estimated: 4 mL/min — ABNORMAL LOW (ref >60)
Glucose, Bld: 83 mg/dL (ref 70–99)
Phosphorus: 11.7 mg/dL — ABNORMAL HIGH (ref 2.5–4.6)
Potassium: 4.9 mmol/L (ref 3.5–5.1)
Sodium: 139 mmol/L (ref 135–145)

## 2024-04-09 LAB — CBC
HCT: 29.6 % — ABNORMAL LOW (ref 39.0–52.0)
Hemoglobin: 9.6 g/dL — ABNORMAL LOW (ref 13.0–17.0)
MCH: 29.6 pg (ref 26.0–34.0)
MCHC: 32.4 g/dL (ref 30.0–36.0)
MCV: 91.4 fL (ref 80.0–100.0)
Platelets: 221 K/uL (ref 150–400)
RBC: 3.24 MIL/uL — ABNORMAL LOW (ref 4.22–5.81)
RDW: 17.3 % — ABNORMAL HIGH (ref 11.5–15.5)
WBC: 6.7 K/uL (ref 4.0–10.5)
nRBC: 0 % (ref 0.0–0.2)

## 2024-04-09 MED ORDER — CHLORHEXIDINE GLUCONATE CLOTH 2 % EX PADS: 6.0000 | MEDICATED_PAD | Freq: Every day | CUTANEOUS | Status: DC

## 2024-04-09 NOTE — Progress Notes (Signed)
 2.5 liters ultrafiltration, condition stable , and patient transported to [address].

## 2024-04-09 NOTE — ED Provider Notes (Signed)
 [place] EMERGENCY DEPARTMENT AT [facility] Provider Note   CSN: [id number] Arrival date & time: 04/09/24  9295     Patient presents with: Dialysis   Paul [name] is a 58 y.o. male.  With a history of ESRD on dialysis MWF who presents to the ED requesting dialysis.  Patient here for scheduled dialysis.  Feels well otherwise.   HPI     Prior to Admission medications   Medication Sig Start Date End Date Taking? Authorizing Provider  ARIPiprazole  ER (ABILIFY  MAINTENA) 400 MG PRSY prefilled syringe Inject 400 mg into the muscle every 28 (twenty-eight) days. 03/24/24   Plovsky, Elna, MD  carvedilol  (COREG ) 6.25 MG tablet Take 1 tablet (6.25 mg total) by mouth 2 (two) times daily with a meal. 01/13/24   Delbert Clam, MD  isosorbide  mononitrate (IMDUR ) 30 MG 24 hr tablet Take 1 tablet (30 mg total) by mouth daily. 10/25/23   [name], MD  OVER THE COUNTER MEDICATION Take 1 capsule by mouth daily. Lion's Mane for memory    [provider]  rosuvastatin  (CRESTOR ) 5 MG tablet Take 1 tablet (5 mg total) by mouth daily. 03/24/23     sucroferric oxyhydroxide (VELPHORO ) 500 MG chewable tablet Chew 3 tablets (1,500 mg total) by mouth 3 (three) times daily with meals. Patient not taking: No sig reported 07/29/23   Tawkaliyar, Roya, DO    Allergies: Penicillin g    Review of Systems  Updated Vital Signs BP (!) 137/90   Pulse 71   Temp (!) 97 F (36.1 C)   Resp 14   Wt 84.3 kg   SpO2 98%   BMI 32.92 kg/m   Physical Exam Vitals and nursing note reviewed.  HENT:     Head: Normocephalic and atraumatic.  Eyes:     Pupils: Pupils are equal, round, and reactive to light.  Cardiovascular:     Rate and Rhythm: Normal rate and regular rhythm.  Pulmonary:     Effort: Pulmonary effort is normal.     Breath sounds: Normal breath sounds.  Abdominal:     Palpations: Abdomen is soft.     Tenderness: There is no abdominal tenderness.  Skin:    General:  Skin is warm and dry.  Neurological:     Mental Status: He is alert.  Psychiatric:        Mood and Affect: Mood normal.     (all labs ordered are listed, but only abnormal results are displayed) Labs Reviewed  RENAL FUNCTION PANEL - Abnormal; Notable for the following components:      Result Value   CO2 20 (*)    BUN 65 (*)    Creatinine, Ser 13.89 (*)    Calcium  6.7 (*)    Phosphorus 11.7 (*)    GFR, Estimated 4 (*)    Anion gap 19 (*)    All other components within normal limits  CBC - Abnormal; Notable for the following components:   RBC 3.24 (*)    Hemoglobin 9.6 (*)    HCT 29.6 (*)    RDW 17.3 (*)    All other components within normal limits    EKG: None  Radiology: No results found.   Procedures   Medications Ordered in the ED  Chlorhexidine  Gluconate Cloth 2 % PADS 6 each (0 each Topical Hold 04/09/24 0921)    Clinical Course as of 04/09/24 1534  Fri Apr 09, 2024  0830 Spoke with nephrology team.  Patient will  be dialyzed during second shift this afternoon [MP]  1510 Patient has been transported from the department to dialysis [MP]    Clinical Course User Index [MP] Pamella Ozell LABOR, DO                                 Medical Decision Making 58 year old male ESRD on dialysis presenting for dialysis as scheduled.  Feels well otherwise.  We will arrange for dialysis from the ED again        Final diagnoses:  ESRD on dialysis [facility])    ED Discharge Orders     None          Pamella Ozell LABOR, DO 04/09/24 1534

## 2024-04-09 NOTE — ED Triage Notes (Signed)
 Pt came in via POV requesting dialysis, has not missed any Tx, is on a MWF schedule. A/Ox4, denies any acute pain, SOB or CP.

## 2024-04-09 NOTE — ED Notes (Signed)
 Pt to dialysis.

## 2024-04-12 ENCOUNTER — Emergency Department (HOSPITAL_COMMUNITY)
Admission: EM | Admit: 2024-04-12 | Discharge: 2024-04-12 | Disposition: A | Discharge status: Home / Self Care | Attending: Emergency Medicine | Admitting: Emergency Medicine

## 2024-04-12 ENCOUNTER — Other Ambulatory Visit: Payer: Self-pay

## 2024-04-12 ENCOUNTER — Encounter (HOSPITAL_COMMUNITY): Payer: Self-pay | Admitting: *Deleted

## 2024-04-12 DIAGNOSIS — N186 End stage renal disease: Secondary | ICD-10-CM | POA: Diagnosis not present

## 2024-04-12 DIAGNOSIS — Z992 Dependence on renal dialysis: Secondary | ICD-10-CM | POA: Diagnosis not present

## 2024-04-12 DIAGNOSIS — I120 Hypertensive chronic kidney disease with stage 5 chronic kidney disease or end stage renal disease: Secondary | ICD-10-CM | POA: Diagnosis not present

## 2024-04-12 LAB — RENAL FUNCTION PANEL
Albumin: 3.3 g/dL — ABNORMAL LOW (ref 3.5–5.0)
Anion gap: 15 (ref 5–15)
BUN: 67 mg/dL — ABNORMAL HIGH (ref 6–20)
CO2: 24 mmol/L (ref 22–32)
Calcium: 6.5 mg/dL — ABNORMAL LOW (ref 8.9–10.3)
Chloride: 103 mmol/L (ref 98–111)
Creatinine, Ser: 14.42 mg/dL — ABNORMAL HIGH (ref 0.61–1.24)
GFR, Estimated: 4 mL/min — ABNORMAL LOW (ref >60)
Glucose, Bld: 100 mg/dL — ABNORMAL HIGH (ref 70–99)
Phosphorus: 11.2 mg/dL — ABNORMAL HIGH (ref 2.5–4.6)
Potassium: 4.4 mmol/L (ref 3.5–5.1)
Sodium: 142 mmol/L (ref 135–145)

## 2024-04-12 LAB — CBC
HCT: 25.1 % — ABNORMAL LOW (ref 39.0–52.0)
Hemoglobin: 8.4 g/dL — ABNORMAL LOW (ref 13.0–17.0)
MCH: 30 pg (ref 26.0–34.0)
MCHC: 33.5 g/dL (ref 30.0–36.0)
MCV: 89.6 fL (ref 80.0–100.0)
Platelets: 203 K/uL (ref 150–400)
RBC: 2.8 MIL/uL — ABNORMAL LOW (ref 4.22–5.81)
RDW: 17.6 % — ABNORMAL HIGH (ref 11.5–15.5)
WBC: 7.3 K/uL (ref 4.0–10.5)
nRBC: 0 % (ref 0.0–0.2)

## 2024-04-12 MED ORDER — CHLORHEXIDINE GLUCONATE CLOTH 2 % EX PADS: 6.0000 | MEDICATED_PAD | Freq: Every day | CUTANEOUS | Status: DC

## 2024-04-12 MED ORDER — DARBEPOETIN ALFA 150 MCG/0.3ML IJ SOSY
150.0000 ug | PREFILLED_SYRINGE | Freq: Once | INTRAMUSCULAR | Status: AC
  Administered 2024-04-12: 150 ug via SUBCUTANEOUS
  Filled 2024-04-12 (×2): qty 0.3

## 2024-04-12 MED ORDER — IRON SUCROSE 300 MG IVPB - SIMPLE MED
300.0000 mg | Freq: Once | Status: AC
  Administered 2024-04-12: 300 mg via INTRAVENOUS
  Filled 2024-04-12: qty 300

## 2024-04-12 MED ORDER — PENTAFLUOROPROP-TETRAFLUOROETH EX AERO: 1.0000 | INHALATION_SPRAY | CUTANEOUS | Status: DC | PRN

## 2024-04-12 MED ORDER — LIDOCAINE HCL (PF) 1 % IJ SOLN: 5.0000 mL | INTRAMUSCULAR | Status: DC | PRN

## 2024-04-12 MED ORDER — ANTICOAGULANT SODIUM CITRATE 4% (200MG/5ML) IV SOLN: 5.0000 mL | Status: DC | PRN

## 2024-04-12 MED ORDER — HEPARIN SODIUM (PORCINE) 1000 UNIT/ML DIALYSIS: 1000.0000 [IU] | INTRAMUSCULAR | Status: DC | PRN

## 2024-04-12 MED ORDER — ALTEPLASE 2 MG IJ SOLR: 2.0000 mg | Freq: Once | INTRAMUSCULAR | Status: DC | PRN

## 2024-04-12 MED ORDER — LIDOCAINE-PRILOCAINE 2.5-2.5 % EX CREA: 1.0000 | TOPICAL_CREAM | CUTANEOUS | Status: DC | PRN

## 2024-04-12 MED ORDER — HEPARIN SODIUM (PORCINE) 1000 UNIT/ML IJ SOLN
INTRAMUSCULAR | Status: AC
  Filled 2024-04-12: qty 3

## 2024-04-12 MED ORDER — HEPARIN SODIUM (PORCINE) 1000 UNIT/ML DIALYSIS
2400.0000 [IU] | Freq: Once | INTRAMUSCULAR | Status: AC
  Administered 2024-04-12: 2400 [IU] via INTRAVENOUS_CENTRAL
  Filled 2024-04-12: qty 3

## 2024-04-12 NOTE — ED Provider Notes (Signed)
 [place] EMERGENCY DEPARTMENT AT [facility] Provider Note   CSN: [id number] Arrival date & time: 04/12/24  9366     Patient presents with: Vascular Access Problem   Paul TUNNEY is a 58 y.o. male.   HPI 58 year old male history of end-stage renal disease on dialysis with last dialysis on Friday presents today for dialysis.  No complaints    Prior to Admission medications   Medication Sig Start Date End Date Taking? Authorizing Provider  ARIPiprazole  ER (ABILIFY  MAINTENA) 400 MG PRSY prefilled syringe Inject 400 mg into the muscle every 28 (twenty-eight) days. 03/24/24   Plovsky, Elna, MD  carvedilol  (COREG ) 6.25 MG tablet Take 1 tablet (6.25 mg total) by mouth 2 (two) times daily with a meal. 01/13/24   Delbert Clam, MD  isosorbide  mononitrate (IMDUR ) 30 MG 24 hr tablet Take 1 tablet (30 mg total) by mouth daily. 10/25/23   [name], MD  OVER THE COUNTER MEDICATION Take 1 capsule by mouth daily. Lion[employer] Mane for memory    [provider]  rosuvastatin  (CRESTOR ) 5 MG tablet Take 1 tablet (5 mg total) by mouth daily. 03/24/23     sucroferric oxyhydroxide (VELPHORO ) 500 MG chewable tablet Chew 3 tablets (1,500 mg total) by mouth 3 (three) times daily with meals. Patient not taking: No sig reported 07/29/23   Heddy Barren, DO    Allergies: Penicillin g    Review of Systems  Updated Vital Signs BP (!) 159/79 (BP Location: Left Arm)   Pulse 83   Temp 97.6 F (36.4 C)   Resp 17   Ht 1.6 m (5' 3)   Wt 84.3 kg   SpO2 100%   BMI 32.92 kg/m   Physical Exam Vitals and nursing note reviewed.  Constitutional:      Appearance: He is well-developed.  HENT:     Head: Normocephalic and atraumatic.     Right Ear: External ear normal.     Left Ear: External ear normal.     Nose: Nose normal.  Eyes:     Extraocular Movements: Extraocular movements intact.  Neck:     Trachea: No tracheal deviation.  Pulmonary:     Effort: Pulmonary effort  is normal.  Musculoskeletal:        General: Normal range of motion.  Skin:    General: Skin is warm and dry.  Neurological:     Mental Status: He is alert and oriented to person, place, and time.  Psychiatric:        Mood and Affect: Mood normal.        Behavior: Behavior normal.     (all labs ordered are listed, but only abnormal results are displayed) Labs Reviewed - No data to display  EKG: None  Radiology: No results found.   Procedures   Medications Ordered in the ED - No data to display                                  Medical Decision Making 58 year old male end-stage renal disease who does not have dialysis at home presents today for dialysis.  He was last dialyzed on Friday.  He is states he has some wheezing but otherwise does not feel like he is significantly short of breath or having other problems. Care discussed with Dr. Geralynn.     Final diagnoses:  ESRD (end stage renal disease) [facility])    ED Discharge  Orders     None          Levander Houston, MD 04/12/24 [phone]

## 2024-04-12 NOTE — Procedures (Signed)
 Asked to see this patient for hospital dialysis. Pt was discharged from his outpatient unit for behavior issues. The plan will be for ED HD. Pt is not to be admitted at this time. Pt will go to the dialysis unit when they are ready for the patient. When dialysis is completed pt will be sent back to ED for reassessment.   Vitals:   04/12/24 0640 04/12/24 0645  BP: (!) 159/79   Pulse: 83   Resp: 17   Temp: 97.6 F (36.4 C)   SpO2: 100%   Weight:  84.3 kg  Height:  5' 3 (1.6 m)   Last OP HD unit info from 07/2023 -->  4h  B400 76.5kg  2K bath RUA AVF Heparin  2400    Clinical issues other than volume/ K+: 1) Anemia of esrd:  - pt dose not have a nephrologist or HD unit so does not have access to ESA's or IV iron , other than when here for hospital HD sessions - last tsat 15% on 03/20/24             - last IV venofer  200mg  on 10/05   - recent Hb's around 9- 10 - last 3 doses of darbepoeitin/ aranesp  were -->   - 200 mcg 10/15 - 150 mcg 10/29 - 150 mcg 11/05  P: - HD today      - give sq aranesp  150 mcg w/ HD      - give 300mg  IV venofer  w/ HD    I was present at the procedure, reviewed the HD regimen and made appropriate changes.   Myer Fret MD  [facility] 04/12/2024, 7:39 AM    Recent Labs  Lab 04/07/24 0706 04/07/24 0824 04/07/24 1329 04/09/24 0943  HGB 8.7* 8.2* 8.4* 9.6*  ALBUMIN  --   --   --  3.6  CALCIUM  6.2*  --   --  6.7*  PHOS  --   --   --  11.7*  CREATININE 16.33* 17.80*  --  13.89*  K 5.1 6.0*  --  4.9   No results for input(s): IRON , TIBC, FERRITIN in the last 168 hours. Inpatient medications:  ARIPiprazole  ER  400 mg Intramuscular Q28 days

## 2024-04-12 NOTE — Progress Notes (Signed)
 Received patient in bed to unit.  Alert and oriented.  Informed consent signed and in chart.   TX duration: 3 hours and 26 minutes.  Patient want3ed to come offf with 4 minutes left Patient tolerated well.  Transported back to the room  Alert, without acute distress.  Hand-off given to patient's nurse.   Access used: R Upper arm fistula Access issues: none  Total UF removed: 1.4L Medication(s) given: Arenesp, Venfor IV   04/12/24 1312  Vitals  Temp 98.3 F (36.8 C)  BP (!) 173/93  Pulse Rate 72  Resp 16  Weight 87.5 kg  Oxygen Therapy  SpO2 100 %  O2 Device Room Air  Patient Activity (if Appropriate) In bed  Pulse Oximetry Type Continuous  Oximetry Probe Site Changed No  During Treatment Monitoring  Dialysate Potassium Concentration 2  Dialysate Calcium  Concentration 2.5  Duration of HD Treatment -hour(s) 3.43 hour(s)  HD Safety Checks Performed Yes  Intra-Hemodialysis Comments Tolerated well;Tx completed  Post Treatment  Dialyzer Clearance Lightly streaked  Liters Processed 75.2  Fluid Removed (mL) 1400 mL  Tolerated HD Treatment No (Comment)  Post-Hemodialysis Comments Patient gcame off with 3 minutes left due to preference  AVG/AVF Arterial Site Held (minutes) 6 minutes  AVG/AVF Venous Site Held (minutes) 6 minutes  Fistula / Graft Right Upper arm Arteriovenous fistula  No placement date or time found.   Placed prior to admission: Yes  Orientation: Right  Access Location: Upper arm  Access Type: Arteriovenous fistula  Site Condition No complications  Fistula / Graft Assessment Present;Thrill;Bruit  Status Patent;Deaccessed  Drainage Description None     Camellia Brasil LPN Kidney Dialysis Unit

## 2024-04-12 NOTE — ED Triage Notes (Signed)
 No complaints here for routine dialysis

## 2024-04-13 ENCOUNTER — Ambulatory Visit: Admitting: Internal Medicine

## 2024-04-13 ENCOUNTER — Other Ambulatory Visit (HOSPITAL_COMMUNITY): Payer: Self-pay

## 2024-04-14 ENCOUNTER — Other Ambulatory Visit: Payer: Self-pay

## 2024-04-14 ENCOUNTER — Other Ambulatory Visit (HOSPITAL_COMMUNITY): Payer: Self-pay

## 2024-04-14 ENCOUNTER — Encounter (HOSPITAL_COMMUNITY): Payer: Self-pay | Admitting: Emergency Medicine

## 2024-04-14 ENCOUNTER — Emergency Department (HOSPITAL_COMMUNITY)
Admission: EM | Admit: 2024-04-14 | Discharge: 2024-04-15 | Discharge status: Against Medical Advice | Attending: Emergency Medicine | Admitting: Emergency Medicine

## 2024-04-14 DIAGNOSIS — N186 End stage renal disease: Secondary | ICD-10-CM | POA: Diagnosis not present

## 2024-04-14 DIAGNOSIS — R0602 Shortness of breath: Secondary | ICD-10-CM | POA: Insufficient documentation

## 2024-04-14 DIAGNOSIS — Z5329 Procedure and treatment not carried out because of patient's decision for other reasons: Secondary | ICD-10-CM | POA: Insufficient documentation

## 2024-04-14 MED ORDER — CHLORHEXIDINE GLUCONATE CLOTH 2 % EX PADS: 6.0000 | MEDICATED_PAD | Freq: Every day | CUTANEOUS | Status: DC

## 2024-04-14 MED ORDER — HEPARIN SODIUM (PORCINE) 1000 UNIT/ML IJ SOLN
INTRAMUSCULAR | Status: AC
  Filled 2024-04-14: qty 3

## 2024-04-14 MED ORDER — HEPARIN SODIUM (PORCINE) 1000 UNIT/ML IJ SOLN
3800.0000 [IU] | Freq: Once | INTRAMUSCULAR | Status: AC
  Administered 2024-04-14: 3500 [IU]

## 2024-04-14 NOTE — Progress Notes (Signed)
 Received patient in ED stretcher bed.Alert and oriented x 4.He signed his hd treatment consent.  Access used:Right arm avf that worked  Duration of treatment: 3.5 hours.  Uf goal : Met 2.5 L as per patient requested.He tolerated his treatment.Renal MD aware.   Hand off to the patient's nurse,back to ED for discharge.

## 2024-04-14 NOTE — ED Triage Notes (Signed)
 Patient is here for his routine dialysis this morning.

## 2024-04-14 NOTE — ED Provider Notes (Signed)
  EMERGENCY DEPARTMENT AT [facility] Provider Note   CSN: [id number] Arrival date & time: 04/14/24  9367     Patient presents with: Vascular Access Problem   Paul [name] is a 58 y.o. male.  {Add pertinent medical, surgical, social history, OB history to YEP:67052} HPI  Patient presents 2 days after most recent dialysis session now with need for dialysis. Patient notes dyspnea, with exertion in particular, though no chest pain, new since most recent dialysis session. Patient has suspicion fluid balance is not optimized. No interval fever, fall, trauma or other complaints.   Prior to Admission medications   Medication Sig Start Date End Date Taking? Authorizing Provider  ARIPiprazole  ER (ABILIFY  MAINTENA) 400 MG PRSY prefilled syringe Inject 400 mg into the muscle every 28 (twenty-eight) days. 03/24/24   Plovsky, Elna, MD  carvedilol  (COREG ) 6.25 MG tablet Take 1 tablet (6.25 mg total) by mouth 2 (two) times daily with a meal. 01/13/24   Delbert Clam, MD  isosorbide  mononitrate (IMDUR ) 30 MG 24 hr tablet Take 1 tablet (30 mg total) by mouth daily. 10/25/23   [name], MD  OVER THE COUNTER MEDICATION Take 1 capsule by mouth daily. [place]ane for memory    [provider]  rosuvastatin  (CRESTOR ) 5 MG tablet Take 1 tablet (5 mg total) by mouth daily. 03/24/23     sucroferric oxyhydroxide (VELPHORO ) 500 MG chewable tablet Chew 3 tablets (1,500 mg total) by mouth 3 (three) times daily with meals. Patient not taking: No sig reported 07/29/23   Heddy Barren, DO    Allergies: Penicillin g    Review of Systems  Updated Vital Signs BP (!) 163/83 (BP Location: Left Arm)   Pulse 92   Temp 97.7 F (36.5 C) (Oral)   Resp (!) 24   Ht 1.6 m (5' 3)   Wt 87 kg   SpO2 97%   BMI 33.98 kg/m   Physical Exam Vitals and nursing note reviewed.  Constitutional:      General: He is not in acute distress.    Appearance: He is well-developed.   HENT:     Head: Normocephalic and atraumatic.  Eyes:     Conjunctiva/sclera: Conjunctivae normal.  Cardiovascular:     Rate and Rhythm: Regular rhythm.  Pulmonary:     Effort: Pulmonary effort is normal. No respiratory distress.     Breath sounds: No stridor.  Abdominal:     General: There is no distension.  Skin:    General: Skin is warm and dry.  Neurological:     Mental Status: He is alert and oriented to person, place, and time.     (all labs ordered are listed, but only abnormal results are displayed) Labs Reviewed - No data to display  EKG: None  Radiology: No results found.  {Document cardiac monitor, telemetry assessment procedure when appropriate:32947} Procedures   Medications Ordered in the ED - No data to display    {Click here for ABCD2, HEART and other calculators REFRESH Note before signing:1}                              Medical Decision Making Adult male, end-stage renal disease presents with dyspnea, worse with exertion he is awake, alert, at rest has minimal tachypnea, but no hypoxia, fever, hypotension suggesting infection.  Case discussed with her nephrology team to facilitate dialysis, given suspicion for fluid overload status.  Amount and/or Complexity  of Data Reviewed External Data Reviewed: notes.    Details: Last ED note 2 days ago reviewed Discussion of management or test interpretation with external provider(s): Case discussed with our nephrology team  Risk Decision regarding hospitalization. Diagnosis or treatment significantly limited by social determinants of health.   Final diagnoses:  SOB (shortness of breath)

## 2024-04-14 NOTE — Procedures (Signed)
 Asked to see this patient for hospital dialysis. Pt was discharged from his outpatient unit for behavior issues. The plan will be for ED HD. Pt is not to be admitted at this time. Pt will go to the dialysis unit when they are ready for the patient. When dialysis is completed pt will be sent back to ED for reassessment.   Vitals:   04/14/24 1223 04/14/24 1404 04/14/24 1410 04/14/24 1430  BP:  (!) 155/95 (!) 150/96 (!) 175/99  Pulse:  73 74 76  Resp:  12 12 12   Temp: 97.8 F (36.6 C) (!) 97.4 F (36.3 C)    TempSrc: Oral     SpO2:  100% 100% 100%  Weight:      Height:           I was present at the procedure, reviewed the HD regimen and made appropriate changes.   Myer Fret MD  [facility] 04/14/2024, 2:38 PM    Recent Labs  Lab 04/09/24 0943 04/12/24 0915  HGB 9.6* 8.4*  ALBUMIN 3.6 3.3*  CALCIUM  6.7* 6.5*  PHOS 11.7* 11.2*  CREATININE 13.89* 14.42*  K 4.9 4.4   No results for input(s): IRON , TIBC, FERRITIN in the last 168 hours. Inpatient medications:  Chlorhexidine  Gluconate Cloth  6 each Topical Q0600

## 2024-04-16 ENCOUNTER — Emergency Department (HOSPITAL_COMMUNITY)
Admission: EM | Admit: 2024-04-16 | Discharge: 2024-04-16 | Disposition: A | Discharge status: Home / Self Care | Attending: Emergency Medicine | Admitting: Emergency Medicine

## 2024-04-16 ENCOUNTER — Encounter (HOSPITAL_COMMUNITY): Payer: Self-pay | Admitting: Emergency Medicine

## 2024-04-16 ENCOUNTER — Other Ambulatory Visit: Payer: Self-pay | Admitting: Pharmacy Technician

## 2024-04-16 ENCOUNTER — Other Ambulatory Visit: Payer: Self-pay

## 2024-04-16 DIAGNOSIS — Z992 Dependence on renal dialysis: Secondary | ICD-10-CM | POA: Insufficient documentation

## 2024-04-16 DIAGNOSIS — I509 Heart failure, unspecified: Secondary | ICD-10-CM | POA: Insufficient documentation

## 2024-04-16 DIAGNOSIS — I120 Hypertensive chronic kidney disease with stage 5 chronic kidney disease or end stage renal disease: Secondary | ICD-10-CM | POA: Diagnosis not present

## 2024-04-16 DIAGNOSIS — I132 Hypertensive heart and chronic kidney disease with heart failure and with stage 5 chronic kidney disease, or end stage renal disease: Secondary | ICD-10-CM | POA: Diagnosis not present

## 2024-04-16 DIAGNOSIS — N186 End stage renal disease: Secondary | ICD-10-CM | POA: Insufficient documentation

## 2024-04-16 LAB — RENAL FUNCTION PANEL
Albumin: 3.5 g/dL (ref 3.5–5.0)
Anion gap: 15 (ref 5–15)
BUN: 49 mg/dL — ABNORMAL HIGH (ref 6–20)
CO2: 23 mmol/L (ref 22–32)
Calcium: 7.1 mg/dL — ABNORMAL LOW (ref 8.9–10.3)
Chloride: 100 mmol/L (ref 98–111)
Creatinine, Ser: 11.04 mg/dL — ABNORMAL HIGH (ref 0.61–1.24)
GFR, Estimated: 5 mL/min — ABNORMAL LOW (ref >60)
Glucose, Bld: 97 mg/dL (ref 70–99)
Phosphorus: 8.6 mg/dL — ABNORMAL HIGH (ref 2.5–4.6)
Potassium: 4.5 mmol/L (ref 3.5–5.1)
Sodium: 138 mmol/L (ref 135–145)

## 2024-04-16 LAB — CBC
HCT: 26.8 % — ABNORMAL LOW (ref 39.0–52.0)
Hemoglobin: 9.1 g/dL — ABNORMAL LOW (ref 13.0–17.0)
MCH: 31.1 pg (ref 26.0–34.0)
MCHC: 34 g/dL (ref 30.0–36.0)
MCV: 91.5 fL (ref 80.0–100.0)
Platelets: 230 K/uL (ref 150–400)
RBC: 2.93 MIL/uL — ABNORMAL LOW (ref 4.22–5.81)
RDW: 18 % — ABNORMAL HIGH (ref 11.5–15.5)
WBC: 7.6 K/uL (ref 4.0–10.5)
nRBC: 0.3 % — ABNORMAL HIGH (ref 0.0–0.2)

## 2024-04-16 MED ORDER — HEPARIN SODIUM (PORCINE) 1000 UNIT/ML DIALYSIS
2500.0000 [IU] | Freq: Once | INTRAMUSCULAR | Status: AC
  Administered 2024-04-16: 2500 [IU] via INTRAVENOUS_CENTRAL

## 2024-04-16 MED ORDER — HEPARIN SODIUM (PORCINE) 1000 UNIT/ML DIALYSIS: 1000.0000 [IU] | INTRAMUSCULAR | Status: DC | PRN

## 2024-04-16 MED ORDER — LIDOCAINE HCL (PF) 1 % IJ SOLN: 5.0000 mL | INTRAMUSCULAR | Status: DC | PRN

## 2024-04-16 MED ORDER — CHLORHEXIDINE GLUCONATE CLOTH 2 % EX PADS: 6.0000 | MEDICATED_PAD | Freq: Every day | CUTANEOUS | Status: DC

## 2024-04-16 MED ORDER — ALTEPLASE 2 MG IJ SOLR: 2.0000 mg | Freq: Once | INTRAMUSCULAR | Status: DC | PRN

## 2024-04-16 MED ORDER — HEPARIN SODIUM (PORCINE) 1000 UNIT/ML IJ SOLN
INTRAMUSCULAR | Status: AC
  Filled 2024-04-16: qty 3

## 2024-04-16 MED ORDER — LIDOCAINE-PRILOCAINE 2.5-2.5 % EX CREA: 1.0000 | TOPICAL_CREAM | CUTANEOUS | Status: DC | PRN

## 2024-04-16 MED ORDER — ANTICOAGULANT SODIUM CITRATE 4% (200MG/5ML) IV SOLN: 5.0000 mL | Status: DC | PRN

## 2024-04-16 MED ORDER — PENTAFLUOROPROP-TETRAFLUOROETH EX AERO: 1.0000 | INHALATION_SPRAY | CUTANEOUS | Status: DC | PRN

## 2024-04-16 NOTE — Progress Notes (Signed)
 Specialty Pharmacy Refill Coordination Note  Paul [name] is a 58 y.o. male contacted today regarding refills of specialty medication(s) ARIPiprazole  (ABILIFY  MAINTENA)   Patient requested Courier to Provider Office   Delivery date: 04/19/24   Verified address: [facility] [place]   Medication will be filled on: 04/16/24

## 2024-04-16 NOTE — ED Triage Notes (Signed)
 Pt here for routine dialysis this morning. No signs of distress.

## 2024-04-16 NOTE — Progress Notes (Signed)
 Received patient in bed to unit.  Alert and oriented.  Informed consent signed and in chart.   TX duration:3.5  Patient tolerated well.  Transported back to the room  Alert, without acute distress.  Hand-off given to patient's nurse.   Access used: Right Upper Arm Fistula Access issues: none  Total UF removed: 3L   04/16/24 1708  Vitals  Temp 98 F (36.7 C)  Temp Source Oral  BP 128/81  Pulse Rate 86  ECG Heart Rate 86  Resp 16  Weight 85.7 kg  Type of Weight Post-Dialysis  Oxygen Therapy  SpO2 98 %  O2 Device Room Air  During Treatment Monitoring  Duration of HD Treatment -hour(s) 3.5 hour(s)  HD Safety Checks Performed Yes  Intra-Hemodialysis Comments Tx initiated  Post Treatment  Dialyzer Clearance Clear  Liters Processed 83.9  Fluid Removed (mL) 3000 mL  Tolerated HD Treatment Yes  Post-Hemodialysis Comments tolerated well, 2500UNITS of Heparin  predialysis used  AVG/AVF Arterial Site Held (minutes) 7 minutes  AVG/AVF Venous Site Held (minutes) 7 minutes  Fistula / Graft Right Upper arm Arteriovenous fistula  No placement date or time found.   Placed prior to admission: Yes  Orientation: Right  Access Location: Upper arm  Access Type: Arteriovenous fistula  Site Condition No complications  Fistula / Graft Assessment Thrill;Present;Bruit  Status Patent;Deaccessed  Drainage Description None     Camellia Brasil LPN Kidney Dialysis Unit

## 2024-04-16 NOTE — ED Notes (Signed)
 Pt ambulatory to waiting room. Pt verbalized understanding of discharge instructions.

## 2024-04-16 NOTE — ED Notes (Signed)
 RN called dialysis and notified them that patient was here.

## 2024-04-16 NOTE — ED Provider Notes (Signed)
 Emergency Department Provider Note   I have reviewed the triage vital signs and the nursing notes.   HISTORY  Chief Complaint Vascular Access Problem   HPI Paul [name] is a 58 y.o. male with past history of ESRD presents to the emergency department for evaluation of routine dialysis.  No complaints today.  He was sent to HD from triage and returns now feeling well.  No complaints.  Past Medical History:  Diagnosis Date   Bipolar disorder (HCC)    Central retinal artery occlusion    with macular infarction of the right eye. status post posterior vitrectomy and photocoagulation with insertion of a shunt in 2006.   CHF (congestive heart failure) (HCC)    chronic mixed syst/diast. 02/2009 echo: Systolic function was mildly reduced. The estimated ejection fraction was in 45%, global HK, Grade I Diast dysfxn.   Chronic kidney disease    ESRD   Heart failure    Obesity    Osteoarthritis    s/p right hip replacement in 2001   Schizophrenia (HCC)    Severe uncontrolled hypertension     Review of Systems  Constitutional: No fever/chills Cardiovascular: Denies chest pain. Respiratory: Denies shortness of breath. Gastrointestinal: No abdominal pain.   Neurological: Negative for headaches. ____________________________________________   PHYSICAL EXAM:  VITAL SIGNS: ED Triage Vitals  Encounter Vitals Group     BP 04/16/24 0643 (!) 171/87     Pulse Rate 04/16/24 0643 79     Resp 04/16/24 0643 16     Temp 04/16/24 0643 97.7 F (36.5 C)     Temp Source 04/16/24 1313 Oral     SpO2 04/16/24 0643 100 %     Weight 04/16/24 1313 195 lb 8.8 oz (88.7 kg)   Constitutional: Alert and oriented. Well appearing and in no acute distress. Eyes: Conjunctivae are normal.  Head: Atraumatic. Nose: No congestion/rhinnorhea. Mouth/Throat: Mucous membranes are moist.   Neck: No stridor.   Cardiovascular: Good peripheral circulation.  Respiratory: Normal respiratory effort.    Gastrointestinal: No distention.  Musculoskeletal: No gross deformities of extremities. Neurologic:  Normal speech and language.  Skin:  Skin is warm, dry and intact. No rash noted. ____________________________________________   LABS (all labs ordered are listed, but only abnormal results are displayed)  Labs Reviewed  CBC - Abnormal; Notable for the following components:      Result Value   RBC 2.93 (*)    Hemoglobin 9.1 (*)    HCT 26.8 (*)    RDW 18.0 (*)    nRBC 0.3 (*)    All other components within normal limits  RENAL FUNCTION PANEL - Abnormal; Notable for the following components:   BUN 49 (*)    Creatinine, Ser 11.04 (*)    Calcium  7.1 (*)    Phosphorus 8.6 (*)    GFR, Estimated 5 (*)    All other components within normal limits   ____________________________________________   PROCEDURES  Procedure(s) performed:   Procedures  None  ____________________________________________   INITIAL IMPRESSION / ASSESSMENT AND PLAN / ED COURSE  Pertinent labs & imaging results that were available during my care of the patient were reviewed by me and considered in my medical decision making (see chart for details).   This patient is Presenting for Evaluation of routine HD, which does require a range of treatment options, and is a complaint that involves a high risk of morbidity and mortality.  The Differential Diagnoses include AKI, electrolyte disturbance, fluid overload, uremia, etc.  Critical Interventions-    Medications  Chlorhexidine  Gluconate Cloth 2 % PADS 6 each (has no administration in time range)  heparin  injection 2,500 Units (2,500 Units Dialysis Given 04/16/24 1334)     Clinical Laboratory Tests Ordered, included potassium 4.5.  Mild anemia to 9.1.  Consult complete with Nephrology. Plan for HD.   Medical Decision Making: Summary:  The patient presents emergency department with need for routine hemodialysis.  He was sent to dialysis after MSE and  returns with no symptoms.  He is feeling well.  Stable for discharge.   Patient's presentation is most consistent with exacerbation of chronic illness.   Disposition: discharge  ____________________________________________  FINAL CLINICAL IMPRESSION(S) / ED DIAGNOSES  Final diagnoses:  ESRD (end stage renal disease) (HCC)    Note:  This document was prepared using Dragon voice recognition software and may include unintentional dictation errors.  Fonda Law, MD, [facility]    Jaena Brocato, Fonda MATSU, MD 04/16/24 1739

## 2024-04-21 ENCOUNTER — Ambulatory Visit (HOSPITAL_COMMUNITY)

## 2024-04-21 ENCOUNTER — Emergency Department (HOSPITAL_COMMUNITY)

## 2024-04-21 ENCOUNTER — Emergency Department (HOSPITAL_COMMUNITY)
Admission: EM | Admit: 2024-04-21 | Discharge: 2024-04-21 | Disposition: A | Discharge status: Home / Self Care | Source: Home / Self Care | Attending: Emergency Medicine | Admitting: Emergency Medicine

## 2024-04-21 ENCOUNTER — Encounter (HOSPITAL_COMMUNITY): Payer: Self-pay | Admitting: Emergency Medicine

## 2024-04-21 DIAGNOSIS — I509 Heart failure, unspecified: Secondary | ICD-10-CM | POA: Diagnosis not present

## 2024-04-21 DIAGNOSIS — R509 Fever, unspecified: Secondary | ICD-10-CM | POA: Diagnosis present

## 2024-04-21 DIAGNOSIS — Z79899 Other long term (current) drug therapy: Secondary | ICD-10-CM | POA: Insufficient documentation

## 2024-04-21 DIAGNOSIS — N186 End stage renal disease: Secondary | ICD-10-CM | POA: Insufficient documentation

## 2024-04-21 DIAGNOSIS — J101 Influenza due to other identified influenza virus with other respiratory manifestations: Secondary | ICD-10-CM | POA: Insufficient documentation

## 2024-04-21 DIAGNOSIS — Z992 Dependence on renal dialysis: Secondary | ICD-10-CM | POA: Diagnosis not present

## 2024-04-21 DIAGNOSIS — R000 Tachycardia, unspecified: Secondary | ICD-10-CM | POA: Insufficient documentation

## 2024-04-21 DIAGNOSIS — Z20822 Contact with and (suspected) exposure to covid-19: Secondary | ICD-10-CM | POA: Insufficient documentation

## 2024-04-21 DIAGNOSIS — I132 Hypertensive heart and chronic kidney disease with heart failure and with stage 5 chronic kidney disease, or end stage renal disease: Secondary | ICD-10-CM | POA: Diagnosis not present

## 2024-04-21 DIAGNOSIS — Z0389 Encounter for observation for other suspected diseases and conditions ruled out: Secondary | ICD-10-CM | POA: Diagnosis not present

## 2024-04-21 DIAGNOSIS — I10 Essential (primary) hypertension: Secondary | ICD-10-CM | POA: Diagnosis not present

## 2024-04-21 LAB — COMPREHENSIVE METABOLIC PANEL WITH GFR
ALT: 18 U/L (ref 0–44)
AST: 13 U/L — ABNORMAL LOW (ref 15–41)
Albumin: 4.3 g/dL (ref 3.5–5.0)
Alkaline Phosphatase: 78 U/L (ref 38–126)
Anion gap: 24 — ABNORMAL HIGH (ref 5–15)
BUN: 85 mg/dL — ABNORMAL HIGH (ref 6–20)
CO2: 17 mmol/L — ABNORMAL LOW (ref 22–32)
Calcium: 7.5 mg/dL — ABNORMAL LOW (ref 8.9–10.3)
Chloride: 99 mmol/L (ref 98–111)
Creatinine, Ser: 15.7 mg/dL — ABNORMAL HIGH (ref 0.61–1.24)
GFR, Estimated: 3 mL/min — ABNORMAL LOW (ref >60)
Glucose, Bld: 103 mg/dL — ABNORMAL HIGH (ref 70–99)
Potassium: 5.7 mmol/L — ABNORMAL HIGH (ref 3.5–5.1)
Sodium: 140 mmol/L (ref 135–145)
Total Bilirubin: 0.7 mg/dL (ref 0.0–1.2)
Total Protein: 7.6 g/dL (ref 6.5–8.1)

## 2024-04-21 LAB — CBC WITH DIFFERENTIAL/PLATELET
Abs Immature Granulocytes: 0.04 K/uL (ref 0.00–0.07)
Abs Immature Granulocytes: 0.04 K/uL (ref 0.00–0.07)
Basophils Absolute: 0 K/uL (ref 0.0–0.1)
Basophils Absolute: 0 K/uL (ref 0.0–0.1)
Basophils Relative: 1 %
Basophils Relative: 0 %
Eosinophils Absolute: 0.3 K/uL (ref 0.0–0.5)
Eosinophils Absolute: 0.1 K/uL (ref 0.0–0.5)
Eosinophils Relative: 4 %
Eosinophils Relative: 1 %
HCT: 27.5 % — ABNORMAL LOW (ref 39.0–52.0)
HCT: 29.3 % — ABNORMAL LOW (ref 39.0–52.0)
Hemoglobin: 9 g/dL — ABNORMAL LOW (ref 13.0–17.0)
Hemoglobin: 9.8 g/dL — ABNORMAL LOW (ref 13.0–17.0)
Immature Granulocytes: 1 %
Immature Granulocytes: 1 %
Lymphocytes Relative: 13 %
Lymphocytes Relative: 5 %
Lymphs Abs: 1 K/uL (ref 0.7–4.0)
Lymphs Abs: 0.3 K/uL — ABNORMAL LOW (ref 0.7–4.0)
MCH: 29.9 pg (ref 26.0–34.0)
MCH: 30 pg (ref 26.0–34.0)
MCHC: 32.7 g/dL (ref 30.0–36.0)
MCHC: 33.4 g/dL (ref 30.0–36.0)
MCV: 91.4 fL (ref 80.0–100.0)
MCV: 89.6 fL (ref 80.0–100.0)
Monocytes Absolute: 1.2 K/uL — ABNORMAL HIGH (ref 0.1–1.0)
Monocytes Absolute: 1.1 K/uL — ABNORMAL HIGH (ref 0.1–1.0)
Monocytes Relative: 16 %
Monocytes Relative: 15 %
Neutro Abs: 5.1 K/uL (ref 1.7–7.7)
Neutro Abs: 5.7 K/uL (ref 1.7–7.7)
Neutrophils Relative %: 65 %
Neutrophils Relative %: 78 %
Platelets: 196 K/uL (ref 150–400)
Platelets: 223 K/uL (ref 150–400)
RBC: 3.01 MIL/uL — ABNORMAL LOW (ref 4.22–5.81)
RBC: 3.27 MIL/uL — ABNORMAL LOW (ref 4.22–5.81)
RDW: 19.1 % — ABNORMAL HIGH (ref 11.5–15.5)
RDW: 19.1 % — ABNORMAL HIGH (ref 11.5–15.5)
WBC: 7.8 K/uL (ref 4.0–10.5)
WBC: 7.2 K/uL (ref 4.0–10.5)
nRBC: 0 % (ref 0.0–0.2)
nRBC: 0 % (ref 0.0–0.2)

## 2024-04-21 LAB — PROTIME-INR
INR: 1 (ref 0.8–1.2)
Prothrombin Time: 13.6 s (ref 11.4–15.2)

## 2024-04-21 LAB — GLUCOSE, CAPILLARY
Glucose-Capillary: 31 mg/dL — CL (ref 70–99)
Glucose-Capillary: 231 mg/dL — ABNORMAL HIGH (ref 70–99)

## 2024-04-21 LAB — RESP PANEL BY RT-PCR (RSV, FLU A&B, COVID)  RVPGX2
Influenza A by PCR: POSITIVE — AB
Influenza B by PCR: NEGATIVE
Resp Syncytial Virus by PCR: NEGATIVE
SARS Coronavirus 2 by RT PCR: NEGATIVE

## 2024-04-21 LAB — I-STAT CG4 LACTIC ACID, ED: Lactic Acid, Venous: 1.2 mmol/L (ref 0.5–1.9)

## 2024-04-21 LAB — CBG MONITORING, ED
Glucose-Capillary: 109 mg/dL — ABNORMAL HIGH (ref 70–99)
Glucose-Capillary: 121 mg/dL — ABNORMAL HIGH (ref 70–99)

## 2024-04-21 MED ORDER — HEPARIN SODIUM (PORCINE) 1000 UNIT/ML DIALYSIS: 1000.0000 [IU] | INTRAMUSCULAR | Status: DC | PRN

## 2024-04-21 MED ORDER — ANTICOAGULANT SODIUM CITRATE 4% (200MG/5ML) IV SOLN: 5.0000 mL | Status: DC | PRN

## 2024-04-21 MED ORDER — CHLORHEXIDINE GLUCONATE CLOTH 2 % EX PADS: 6.0000 | MEDICATED_PAD | Freq: Every day | CUTANEOUS | Status: DC

## 2024-04-21 MED ORDER — LIDOCAINE-PRILOCAINE 2.5-2.5 % EX CREA: 1.0000 | TOPICAL_CREAM | CUTANEOUS | Status: DC | PRN

## 2024-04-21 MED ORDER — DEXTROSE 50 % IV SOLN
1.0000 | Freq: Once | INTRAVENOUS | Status: AC
  Administered 2024-04-21: 16:00:00 50 mL via INTRAVENOUS

## 2024-04-21 MED ORDER — PENTAFLUOROPROP-TETRAFLUOROETH EX AERO: 1.0000 | INHALATION_SPRAY | CUTANEOUS | Status: DC | PRN

## 2024-04-21 MED ORDER — OSELTAMIVIR PHOSPHATE 30 MG PO CAPS
ORAL_CAPSULE | ORAL | 0 refills | Status: AC
  Filled 2024-04-21: qty 2, fill #0

## 2024-04-21 MED ORDER — CARVEDILOL 3.125 MG PO TABS
6.2500 mg | ORAL_TABLET | Freq: Once | ORAL | Status: AC
  Administered 2024-04-21: 18:00:00 6.25 mg via ORAL
  Filled 2024-04-21: qty 2

## 2024-04-21 MED ORDER — HEPARIN SODIUM (PORCINE) 1000 UNIT/ML DIALYSIS
1000.0000 [IU] | INTRAMUSCULAR | Status: AC | PRN
  Administered 2024-04-21: 15:00:00 1000 [IU] via INTRAVENOUS_CENTRAL

## 2024-04-21 MED ORDER — HEPARIN SODIUM (PORCINE) 1000 UNIT/ML IJ SOLN
INTRAMUSCULAR | Status: AC
  Filled 2024-04-21: qty 4

## 2024-04-21 MED ORDER — OSELTAMIVIR PHOSPHATE 30 MG PO CAPS
30.0000 mg | ORAL_CAPSULE | Freq: Once | ORAL | Status: AC
  Administered 2024-04-21: 30 mg via ORAL
  Filled 2024-04-21: qty 1

## 2024-04-21 MED ORDER — LIDOCAINE HCL (PF) 1 % IJ SOLN: 5.0000 mL | INTRAMUSCULAR | Status: DC | PRN

## 2024-04-21 MED ORDER — ALTEPLASE 2 MG IJ SOLR: 2.0000 mg | Freq: Once | INTRAMUSCULAR | Status: DC | PRN

## 2024-04-21 MED ORDER — HEPARIN SODIUM (PORCINE) 1000 UNIT/ML DIALYSIS
2500.0000 [IU] | Freq: Once | INTRAMUSCULAR | Status: AC
  Administered 2024-04-21: 13:00:00 2500 [IU] via INTRAVENOUS_CENTRAL

## 2024-04-21 MED ORDER — DEXTROSE 50 % IV SOLN
INTRAVENOUS | Status: AC
  Filled 2024-04-21: qty 50

## 2024-04-21 NOTE — ED Notes (Signed)
 [place] RN report given

## 2024-04-21 NOTE — ED Notes (Signed)
 Dialysis notified pt is here

## 2024-04-21 NOTE — Progress Notes (Addendum)
 Pt. Awake and oriented with no complaints Consent signed and on file Started with no complications  UF removal: Tx duration: 2.59 hours  Access used: Right AVF Access issue: None  Ended tx with 16 minutes HD tx time remaining with UF removal of 2.3L due to pt.blanked out and had hypotension episode. Blood returned and gave extra 200cc of PNSS. Pt. Is awake and oriented but jitterring. Hooked to 2LPM of 02 via nasal cannula. Checked CBG with 31mg /dL result. Gave sandwich and juice and some snacks. Informed Dr. Lamar Fret and gave 50% dextrose . See MAR. Allowed pt to rest. Rechecked CBG with 231mg /dL.  Pressure dressing applied. Pt stated that he is feeling better now but still shaking.   Endorsed to ED CN nurse that pt. Needed to be evaluated further. Transported to ED.  Karina Lenderman Rubi Bradlee Bridgers, RN Kidney Care Unit

## 2024-04-21 NOTE — Discharge Instructions (Addendum)
 Start taking the tamiflu  medication.  You take it after your dialysis session

## 2024-04-21 NOTE — ED Provider Notes (Signed)
 Patient presented to the ED earlier today for routine dialysis.  Patient is not able to go to an outpatient dialysis center so has been coming to the ED.  Patient has been to the ED 76 times in the last 6 months primarily to receive his dialysis.  Patient was seen by my colleague earlier today and went up for dialysis.  Patient returned down to the ED.  Patient reportedly had an episode of hypoglycemia while he was in dialysis.  Patient states since coming down from dialysis he has noticed that he has been feeling somewhat chilled.  He has been coughing recently.  Patient states someone he lives with has been diagnosed with the flu Physical Exam  BP (!) 163/105   Pulse (!) 118   Temp 98.6 F (37 C) (Oral)   Resp 20   Wt 86.2 kg   SpO2 100%   BMI 33.66 kg/m   Physical Exam Vitals and nursing note reviewed.  Constitutional:      Appearance: He is well-developed. He is ill-appearing.  HENT:     Head: Normocephalic and atraumatic.     Right Ear: External ear normal.     Left Ear: External ear normal.  Eyes:     General: No scleral icterus.       Right eye: No discharge.        Left eye: No discharge.     Conjunctiva/sclera: Conjunctivae normal.  Neck:     Trachea: No tracheal deviation.  Cardiovascular:     Rate and Rhythm: Tachycardia present.  Pulmonary:     Effort: Pulmonary effort is normal. No respiratory distress.     Breath sounds: No stridor. Rhonchi present.  Abdominal:     General: There is no distension.  Musculoskeletal:        General: No swelling or deformity.     Cervical back: Neck supple.  Skin:    General: Skin is warm and dry.     Findings: No rash.  Neurological:     Mental Status: He is alert. Mental status is at baseline.     Cranial Nerves: No dysarthria or facial asymmetry.     Motor: No seizure activity.     Procedures  Procedures  ED Course / MDM   Clinical Course as of 04/21/24 2311  Wed Apr 21, 2024  2128 CBC with Differential(!) CBC  does not show any leukocytosis. [JK]  2128 DG Chest Port 1 View Chest x-ray without acute finding [JK]  2302 I-Stat Lactic Acid, ED Actiq  acid level normal.  Influenza positive [JK]    Clinical Course User Index [JK] Randol Simmonds, MD   Medical Decision Making Amount and/or Complexity of Data Reviewed Labs: ordered. Decision-making details documented in ED Course. Radiology: ordered. Decision-making details documented in ED Course.  Risk Prescription drug management.   Presented back down to the ED for reevaluation after his dialysis today.  While he was here he started experiencing low-grade fever.  Patient does report being exposed to ill contacts at home.  Patient's ED workup did not show any signs of pneumonia.  Lactic acid levels normal.  No findings to suggest evolving sepsis.  Patient's influenza test however is positive.  Will plan to discharge home with oral treatment       Randol Simmonds, MD 04/21/24 2312

## 2024-04-21 NOTE — ED Triage Notes (Signed)
 Pt here for routine dialysis this morning. No signs of distress.

## 2024-04-21 NOTE — ED Provider Triage Note (Signed)
 Emergency Medicine Provider Triage Evaluation Note  Paul [name] , a 58 y.o. male  was evaluated in triage.  Pt complains of needing dialysis. Also states his BP is high and he didn't take his BP meds this AM. He states he is supposed to take them on the mornings of dialysis. Missed his Monday dialysis, last dialysis was Friday, got a full session.  Review of Systems  Positive: +high BP Negative: SOB, increased fluid overload, CP  Physical Exam  BP (!) 206/103 (BP Location: Left Arm)   Pulse (!) 106   Temp 97.7 F (36.5 C)   Resp 18   SpO2 100%  Gen:   Awake, no distress  3 Resp:  Normal effort  MSK:   Moves extremities without difficulty  Other:  No significant LEE  Medical Decision Making  Medically screening exam initiated at 8:24 AM.  Appropriate orders placed.  Willard Farquharson Kling was informed that the remainder of the evaluation will be completed by another provider, this initial triage assessment does not replace that evaluation, and the importance of remaining in the ED until their evaluation is complete.  Patient will be moved to treatment space. Nephrology consulted.    Franklyn Sid SAILOR, MD 04/22/24 [phone]

## 2024-04-21 NOTE — ED Notes (Signed)
Transported to Dialysis

## 2024-04-21 NOTE — Progress Notes (Signed)
°   04/21/24 1632  Vitals  Temp 97.6 F (36.4 C)  Pulse Rate (!) 110  Resp (!) 21  BP (!) 158/98  SpO2 100 %  O2 Device Nasal Cannula  Weight 86.2 kg  Oxygen Therapy  O2 Flow Rate (L/min) 2 L/min  Patient Activity (if Appropriate) In bed  Pulse Oximetry Type Continuous  Oximetry Probe Site Changed No  Post Treatment  Dialyzer Clearance Lightly streaked  Hemodialysis Intake (mL) 0 mL  Liters Processed 62.9  Fluid Removed (mL) 2300 mL  Tolerated HD Treatment Yes  AVG/AVF Arterial Site Held (minutes) 6 minutes  AVG/AVF Venous Site Held (minutes) 6 minutes

## 2024-04-21 NOTE — ED Provider Notes (Signed)
 [place] EMERGENCY DEPARTMENT AT [facility] Provider Note   CSN: [id number] Arrival date & time: 04/21/24  9360     History {Add pertinent medical, surgical, social history, OB history to HPI:1} Chief Complaint  Patient presents with   Vascular Access Problem    PAVLE WILER is a 58 y.o. male with PMH as listed below who presents with ***.    Past Medical History:  Diagnosis Date   Bipolar disorder (HCC)    Central retinal artery occlusion    with macular infarction of the right eye. status post posterior vitrectomy and photocoagulation with insertion of a shunt in 2006.   CHF (congestive heart failure) (HCC)    chronic mixed syst/diast. 02/2009 echo: Systolic function was mildly reduced. The estimated ejection fraction was in 45%, global HK, Grade I Diast dysfxn.   Chronic kidney disease    ESRD   Heart failure    Obesity    Osteoarthritis    s/p right hip replacement in 2001   Schizophrenia (HCC)    Severe uncontrolled hypertension        Home Medications Prior to Admission medications  Medication Sig Start Date End Date Taking? Authorizing Provider  ARIPiprazole  ER (ABILIFY  MAINTENA) 400 MG PRSY prefilled syringe Inject 400 mg into the muscle every 28 (twenty-eight) days. 03/24/24   Plovsky, Elna, MD  carvedilol  (COREG ) 6.25 MG tablet Take 1 tablet (6.25 mg total) by mouth 2 (two) times daily with a meal. 01/13/24   Delbert Clam, MD  isosorbide  mononitrate (IMDUR ) 30 MG 24 hr tablet Take 1 tablet (30 mg total) by mouth daily. 10/25/23   [name], MD  OVER THE COUNTER MEDICATION Take 1 capsule by mouth daily. [place] for memory    [provider]  rosuvastatin  (CRESTOR ) 5 MG tablet Take 1 tablet (5 mg total) by mouth daily. 03/24/23     sucroferric oxyhydroxide (VELPHORO ) 500 MG chewable tablet Chew 3 tablets (1,500 mg total) by mouth 3 (three) times daily with meals. Patient not taking: No sig reported 07/29/23   Heddy Barren, DO      Allergies    Penicillin g    Review of Systems   Review of Systems A 10 point review of systems was performed and is negative unless otherwise reported in HPI.  Physical Exam Updated Vital Signs BP (!) 206/103 (BP Location: Left Arm)   Pulse (!) 106   Temp 97.7 F (36.5 C)   Resp 18   SpO2 100%  Physical Exam General: Normal appearing {Desc; male/male:11659}, lying in bed.  HEENT: PERRLA, Sclera anicteric, MMM, trachea midline.  Cardiology: RRR, no murmurs/rubs/gallops. BL radial and DP pulses equal bilaterally.  Resp: Normal respiratory rate and effort. CTAB, no wheezes, rhonchi, crackles.  Abd: Soft, non-tender, non-distended. No rebound tenderness or guarding.  GU: Deferred. MSK: No peripheral edema or signs of trauma. Extremities without deformity or TTP. No cyanosis or clubbing. Skin: warm, dry. No rashes or lesions. Back: No CVA tenderness Neuro: A&Ox4, CNs II-XII grossly intact. MAEs. Sensation grossly intact.  Psych: Normal mood and affect.   ED Results / Procedures / Treatments   Labs (all labs ordered are listed, but only abnormal results are displayed) Labs Reviewed - No data to display  EKG None  Radiology No results found.  Procedures Procedures  {Document cardiac monitor, telemetry assessment procedure when appropriate:1}  Medications Ordered in ED Medications  Chlorhexidine  Gluconate Cloth 2 % PADS 6 each (has no administration in time  range)    ED Course/ Medical Decision Making/ A&P                          Medical Decision Making   This patient presents to the ED for concern of ***, this involves an extensive number of treatment options, and is a complaint that carries with it a high risk of complications and morbidity.  I considered the following differential and admission for this acute, potentially life threatening condition.   MDM:    ***     Labs: I Ordered, and personally interpreted labs.  The pertinent results  include:  ***  Imaging Studies ordered: I ordered imaging studies including *** I independently visualized and interpreted imaging. I agree with the radiologist interpretation  Additional history obtained from ***.  External records from outside source obtained and reviewed including ***  Cardiac Monitoring: The patient was maintained on a cardiac monitor.  I personally viewed and interpreted the cardiac monitored which showed an underlying rhythm of: ***  Reevaluation: After the interventions noted above, I reevaluated the patient and found that they have :{resolved/improved/worsened:23923::improved}  Social Determinants of Health: ***  Disposition:  ***  Co morbidities that complicate the patient evaluation  Past Medical History:  Diagnosis Date   Bipolar disorder (HCC)    Central retinal artery occlusion    with macular infarction of the right eye. status post posterior vitrectomy and photocoagulation with insertion of a shunt in 2006.   CHF (congestive heart failure) (HCC)    chronic mixed syst/diast. 02/2009 echo: Systolic function was mildly reduced. The estimated ejection fraction was in 45%, global HK, Grade I Diast dysfxn.   Chronic kidney disease    ESRD   Heart failure    Obesity    Osteoarthritis    s/p right hip replacement in 2001   Schizophrenia (HCC)    Severe uncontrolled hypertension      Medicines Meds ordered this encounter  Medications   Chlorhexidine  Gluconate Cloth 2 % PADS 6 each    I have reviewed the patients home medicines and have made adjustments as needed  Problem List / ED Course: Problem List Items Addressed This Visit   None        {Document critical care time when appropriate:1} {Document review of labs and clinical decision tools ie heart score, Chads2Vasc2 etc:1}  {Document your independent review of radiology images, and any outside records:1} {Document your discussion with family members, caretakers, and with  consultants:1} {Document social determinants of health affecting pt's care:1} {Document your decision making why or why not admission, treatments were needed:1}  This note was created using dictation software, which may contain spelling or grammatical errors.

## 2024-04-21 NOTE — Progress Notes (Signed)
 Spoke with Dr. Geralynn in regards to a blood sugar that was completed approx 15 minutes ago---numbers are 31---pt is diaphoretic but alert and eating a snack---1 ampule of Dextrose  50% iv ordered x 1---then to repeat the blood sugar 15 minutes after---Krizzelle RN is completing this now

## 2024-04-22 ENCOUNTER — Ambulatory Visit (HOSPITAL_COMMUNITY)

## 2024-04-22 ENCOUNTER — Other Ambulatory Visit (HOSPITAL_COMMUNITY): Payer: Self-pay

## 2024-04-22 ENCOUNTER — Ambulatory Visit: Payer: Self-pay

## 2024-04-22 VITALS — BP 154/78 | HR 102 | Ht 63.0 in | Wt 194.0 lb

## 2024-04-22 DIAGNOSIS — F250 Schizoaffective disorder, bipolar type: Secondary | ICD-10-CM | POA: Diagnosis not present

## 2024-04-22 NOTE — Telephone Encounter (Signed)
 Can you reach out to patient and schedule with Ronal Caldron

## 2024-04-22 NOTE — Progress Notes (Cosign Needed)
 Patient arrives today for his due injection of Abilify  Maintena 400 mg. Patient presents well groomed with a flat affect - he was diagnosed with the flu yesterday. Patient denies any SI/HI or AVH. He is still unable to go to a dialysis center and he said this is really affecting his quality of life. Injection was prepared as ordered and administered in patients LUOQ. Patient tolerated well and without complaint, he will return in 28 days for the next due injection.      NDC: 40851-927-19 LOT: [id number] EXP: APR 2028

## 2024-04-22 NOTE — Telephone Encounter (Signed)
 FYI Only or Action Required?: FYI only for provider: Unable to set up appt for patient d/t patient disconnecting before end of call.  Patient was last seen in primary care on 02/17/2024 by Tanda Bleacher, MD.  Called Nurse Triage reporting Hypertension.  Symptoms began yesterday.  Interventions attempted: Prescription medications: Carvedilol .  Symptoms are: unchanged.  Triage Disposition: See HCP Within 4 Hours (Or PCP Triage)  Patient/caregiver understands and will follow disposition?: No, wishes to speak with PCP  Reason for Disposition  [1] Systolic BP >= 200 OR Diastolic >= 120 AND [2] having NO cardiac or neurologic symptoms  Answer Assessment - Initial Assessment Questions Patient calls in stating that he would like to see his PCP to adjust BP medications. He was at dialysis yesterday and his BP was 200/113. He was held at appt for a bit, but unsure of any other readings. He has not checked his BP since this time. He denies any symptoms, but does report recent cough and mild SOB from the flu. He is currently taking Carvedilol  and reports that he took it this morning.Triage completed with patient, but unable to provide disposition and care advice to patient as he disconnected abruptly before end of call.   1. BLOOD PRESSURE: What is your blood pressure? Did you take at least two measurements 5 minutes apart?     200/113 yesterday while at dialysis, has not checked BP since this value  2. ONSET: When did you take your blood pressure?     Yesterday  3. HOW: How did you take your blood pressure? (e.g., automatic home BP monitor, visiting nurse)     Dialysis appt  4. HISTORY: Do you have a history of high blood pressure?     Yes  5. MEDICINES: Are you taking any medicines for blood pressure? Have you missed any doses recently?     Carvedilol  6.25mg ; has not missed dose  6. OTHER SYMPTOMS: Do you have any symptoms? (e.g., blurred vision, chest pain, difficulty  breathing, headache, weakness)     Denies any other symptoms; states he has had cough and mild SOB from the flu  7. PREGNANCY: Is there any chance you are pregnant? When was your last menstrual period?     NA  Protocols used: Blood Pressure - High-A-AH  Copied from CRM #[id number]. Topic: Clinical - Red Word Triage >> Apr 22, 2024  9:58 AM Emylou G wrote: [facility] that prompted transfer to Nurse Triage:  Needs to discuss high bp.. seems as if his current meds - would like sooner appt w/PCP or maybe a change in meds.  He tried to leave dialysis appt and they wouldn't let him leave at first due to his high bp ( yesterday at the appt it was 200/113 )

## 2024-04-23 ENCOUNTER — Other Ambulatory Visit: Payer: Self-pay

## 2024-04-23 ENCOUNTER — Emergency Department (HOSPITAL_COMMUNITY)
Admission: EM | Admit: 2024-04-23 | Discharge: 2024-04-23 | Disposition: A | Discharge status: Home / Self Care | Source: Home / Self Care | Attending: Emergency Medicine | Admitting: Emergency Medicine

## 2024-04-23 DIAGNOSIS — Z79899 Other long term (current) drug therapy: Secondary | ICD-10-CM | POA: Insufficient documentation

## 2024-04-23 DIAGNOSIS — I509 Heart failure, unspecified: Secondary | ICD-10-CM | POA: Insufficient documentation

## 2024-04-23 DIAGNOSIS — I132 Hypertensive heart and chronic kidney disease with heart failure and with stage 5 chronic kidney disease, or end stage renal disease: Secondary | ICD-10-CM | POA: Diagnosis not present

## 2024-04-23 DIAGNOSIS — I120 Hypertensive chronic kidney disease with stage 5 chronic kidney disease or end stage renal disease: Secondary | ICD-10-CM | POA: Diagnosis not present

## 2024-04-23 DIAGNOSIS — Z992 Dependence on renal dialysis: Secondary | ICD-10-CM | POA: Insufficient documentation

## 2024-04-23 DIAGNOSIS — N186 End stage renal disease: Secondary | ICD-10-CM | POA: Diagnosis not present

## 2024-04-23 MED ORDER — HEPARIN SODIUM (PORCINE) 1000 UNIT/ML IJ SOLN
INTRAMUSCULAR | Status: AC
  Filled 2024-04-23: qty 4

## 2024-04-23 MED ORDER — ALTEPLASE 2 MG IJ SOLR: 2.0000 mg | Freq: Once | INTRAMUSCULAR | Status: DC | PRN

## 2024-04-23 MED ORDER — CHLORHEXIDINE GLUCONATE CLOTH 2 % EX PADS: 6.0000 | MEDICATED_PAD | Freq: Every day | CUTANEOUS | Status: DC

## 2024-04-23 MED ORDER — ANTICOAGULANT SODIUM CITRATE 4% (200MG/5ML) IV SOLN: 5.0000 mL | Status: DC | PRN

## 2024-04-23 MED ORDER — LIDOCAINE-PRILOCAINE 2.5-2.5 % EX CREA: 1.0000 | TOPICAL_CREAM | CUTANEOUS | Status: DC | PRN

## 2024-04-23 MED ORDER — HEPARIN SODIUM (PORCINE) 1000 UNIT/ML DIALYSIS: 1000.0000 [IU] | INTRAMUSCULAR | Status: DC | PRN

## 2024-04-23 MED ORDER — PENTAFLUOROPROP-TETRAFLUOROETH EX AERO: 1.0000 | INHALATION_SPRAY | CUTANEOUS | Status: DC | PRN

## 2024-04-23 MED ORDER — LIDOCAINE HCL (PF) 1 % IJ SOLN: 5.0000 mL | INTRAMUSCULAR | Status: DC | PRN

## 2024-04-23 MED ORDER — HEPARIN SODIUM (PORCINE) 1000 UNIT/ML DIALYSIS
4000.0000 [IU] | Freq: Once | INTRAMUSCULAR | Status: AC
  Administered 2024-04-23: 4000 [IU] via INTRAVENOUS_CENTRAL

## 2024-04-23 NOTE — ED Triage Notes (Signed)
 Patient is here for his routine hemodialysis treatment , no other complaints / no distress.

## 2024-04-23 NOTE — Progress Notes (Signed)
 Aware that pt is in ED -> HD orders placed in Epic.  Izetta Boehringer, PA-C [employer] Pager [phone]

## 2024-04-23 NOTE — Progress Notes (Signed)
 Received patient in bed to unit.  Alert and oriented.  Informed consent signed and in chart.   TX duration:3.5  Patient tolerated well.  Transported back to the room  Alert, without acute distress.  Hand-off given to patient's nurse.   Access used: AVF Access issues: NA  Total UF removed: 2L Medication(s) given: NA Post HD weight: 84.8KG   04/23/24 1355  Vitals  Temp (!) 97.5 F (36.4 C)  Temp Source Axillary  BP 115/68  MAP (mmHg) 84  Pulse Rate 85  ECG Heart Rate 85  Resp 16  Weight 84.8 kg  Type of Weight Post-Dialysis  Oxygen Therapy  SpO2 98 %  O2 Device Room Air  During Treatment Monitoring  Blood Flow Rate (mL/min) 399 mL/min  Arterial Pressure (mmHg) -191.71 mmHg  Venous Pressure (mmHg) 250.09 mmHg  TMP (mmHg) 10.3 mmHg  Ultrafiltration Rate (mL/min) 842 mL/min  Dialysate Flow Rate (mL/min) 300 ml/min  Dialysate Potassium Concentration 2  Dialysate Calcium  Concentration 2.5  Duration of HD Treatment -hour(s) 3.47 hour(s)  Cumulative Fluid Removed (mL) per Treatment  1977.19  HD Safety Checks Performed Yes  Intra-Hemodialysis Comments Tx completed  Post Treatment  Dialyzer Clearance Lightly streaked  Hemodialysis Intake (mL) 120 mL  Liters Processed 84  Fluid Removed (mL) 2000 mL  Tolerated HD Treatment Yes  AVG/AVF Arterial Site Held (minutes) 5 minutes  AVG/AVF Venous Site Held (minutes) 5 minutes  Fistula / Graft Right Upper arm Arteriovenous fistula  No placement date or time found.   Placed prior to admission: Yes  Orientation: Right  Access Location: Upper arm  Access Type: Arteriovenous fistula  Site Condition No complications  Fistula / Graft Assessment Present;Thrill  Status Deaccessed  Needle Size 15  Drainage Description None    [facility]

## 2024-04-23 NOTE — Procedures (Signed)
 Asked to see this patient for hospital dialysis. Pt was discharged from his outpatient unit for behavior issues. The plan will be for ED HD. Pt is not to be admitted at this time. Pt will go to the dialysis unit when they are ready for the patient. When dialysis is completed pt will be sent back to ED for reassessment.   Vitals:   04/23/24 1230 04/23/24 1241 04/23/24 1300 04/23/24 1315  BP: 124/69 (!) 109/52 120/75 116/75  Pulse: 87 85 80 84  Resp: 15 13 17 15   Temp:      TempSrc:      SpO2: 98% 98% 97% 97%  Weight:       Pt seen in [facility], no c/o's today. 2 L UF goal (passed out last HD here w/ 3 L goal).     I was present at the procedure, reviewed the HD regimen and made appropriate changes.   Myer Fret MD  [facility] 04/23/2024, 1:21 PM    Recent Labs  Lab 04/21/24 0835 04/21/24 1906  HGB 9.0* 9.8*  ALBUMIN 4.3  --   CALCIUM  7.5*  --   CREATININE 15.70*  --   K 5.7*  --    No results for input(s): IRON , TIBC, FERRITIN in the last 168 hours. Inpatient medications:  Chlorhexidine  Gluconate Cloth  6 each Topical Q0600    anticoagulant sodium citrate      alteplase , anticoagulant sodium citrate , heparin , lidocaine  (PF), lidocaine -prilocaine , pentafluoroprop-tetrafluoroeth

## 2024-04-23 NOTE — ED Provider Notes (Signed)
 " [place] [facility] AT [facility] Provider Note   CSN: [id number] Arrival date & time: 04/23/24  9362     Patient presents with: Hemodialysis   Paul [name] is a 58 y.o. male.  With past medical history of hypertension, congestive heart failure, end-stage renal disease on dialysis, alcohol use disorder in remission presents to emergency room with complaint of dialysis need.  Patient presents to the ED for routine dialysis.  He has no acute complaints.  Was recently diagnosed with influenza and reports his symptoms have improved since last seen here. Well know to department.    HPI     Prior to Admission medications  Medication Sig Start Date End Date Taking? Authorizing Provider  ARIPiprazole  ER (ABILIFY  MAINTENA) 400 MG PRSY prefilled syringe Inject 400 mg into the muscle every 28 (twenty-eight) days. 03/24/24   Plovsky, Elna, MD  carvedilol  (COREG ) 6.25 MG tablet Take 1 tablet (6.25 mg total) by mouth 2 (two) times daily with a meal. 01/13/24   Delbert Clam, MD  isosorbide  mononitrate (IMDUR ) 30 MG 24 hr tablet Take 1 tablet (30 mg total) by mouth daily. 10/25/23   [name], MD  oseltamivir  (TAMIFLU ) 30 MG capsule Take 1 capsule by mouth after your next two dialysis sessions 04/21/24   Randol Simmonds, MD  OVER THE COUNTER MEDICATION Take 1 capsule by mouth daily. [employer] for memory    [provider]  rosuvastatin  (CRESTOR ) 5 MG tablet Take 1 tablet (5 mg total) by mouth daily. 03/24/23     sucroferric oxyhydroxide (VELPHORO ) 500 MG chewable tablet Chew 3 tablets (1,500 mg total) by mouth 3 (three) times daily with meals. Patient not taking: No sig reported 07/29/23   Heddy Barren, DO    Allergies: Penicillin g    Review of Systems  Constitutional:  Positive for activity change.    Updated Vital Signs BP (!) 174/93 (BP Location: Right Arm)   Pulse 100   Temp 97.9 F (36.6 C)   Resp 18   SpO2 99%   Physical Exam Vitals  and nursing note reviewed.  Constitutional:      General: He is not in acute distress.    Appearance: He is not toxic-appearing.  HENT:     Head: Normocephalic and atraumatic.  Eyes:     General: No scleral icterus.    Conjunctiva/sclera: Conjunctivae normal.  Cardiovascular:     Rate and Rhythm: Normal rate and regular rhythm.     Pulses: Normal pulses.     Heart sounds: Normal heart sounds.  Pulmonary:     Effort: Pulmonary effort is normal. No respiratory distress.     Breath sounds: Normal breath sounds.  Abdominal:     General: Abdomen is flat. Bowel sounds are normal.     Palpations: Abdomen is soft.     Tenderness: There is no abdominal tenderness.  Skin:    General: Skin is warm and dry.     Findings: No lesion.  Neurological:     General: No focal deficit present.     Mental Status: He is alert and oriented to person, place, and time. Mental status is at baseline.     (all labs ordered are listed, but only abnormal results are displayed) Labs Reviewed - No data to display  EKG: None  Radiology: [facility] Chest Port 1 View Result Date: 04/21/2024 EXAM: 1 VIEW(S) XRAY OF THE CHEST 04/21/2024 07:14:00 PM COMPARISON: Comparison with 04/07/2024. CLINICAL HISTORY: Questionable sepsis - evaluate  for abnormality. FINDINGS: LINES, TUBES AND DEVICES: Vascular graft in the right subclavian region. LUNGS AND PLEURA: Shallow inspiration. Lungs are clear. HEART AND MEDIASTINUM: Heart size and pulmonary vascularity are normal. BONES AND SOFT TISSUES: Degenerative changes in the spine and shoulders. IMPRESSION: 1. No acute cardiopulmonary process. Electronically signed by: Elsie Gravely MD 04/21/2024 07:18 PM EST RP Workstation: [id number]     Procedures   Medications Ordered in the ED - No data to display  Clinical Course as of 04/23/24 0810  Fri Apr 23, 2024  0809 Dr Susannah with nephrology is aware of patient and dialysis orders have been placed.  [JB]    Clinical Course User  Index [JB] Kathia Covington, Warren SAILOR, PA-C                                 Medical Decision Making  This patient presents to the ED for concern of dialysis, this involves an extensive number of treatment options, and is a complaint that carries with it a high risk of complications and morbidity.  The differential diagnosis includes need for emergent dialysis, need for routine dialysis, CHF exacerbation, viral illness, pneumonia   Cardiac Monitoring: / EKG:  The patient was maintained on a cardiac monitor.     Problem List / ED Course / Critical interventions / Medication management  Presents stable well-appearing he is here for routine dialysis with no other additional complaints.  He is well-known to department and is seen Monday Wednesday Friday for routine dialysis out of the ED.  I have contacted nephrology for this.  Patient has left the Ed for dialysis treatment. Suspect stable for d/c upon return.           Final diagnoses:  ESRD (end stage renal disease) on dialysis [facility])    ED Discharge Orders     None          Shermon Warren SAILOR, PA-C 04/23/24 9047  "

## 2024-04-25 ENCOUNTER — Emergency Department (HOSPITAL_COMMUNITY)
Admission: EM | Admit: 2024-04-25 | Discharge: 2024-04-25 | Discharge status: Against Medical Advice | Attending: Emergency Medicine | Admitting: Emergency Medicine

## 2024-04-25 ENCOUNTER — Encounter (HOSPITAL_COMMUNITY): Payer: Self-pay

## 2024-04-25 ENCOUNTER — Other Ambulatory Visit: Payer: Self-pay

## 2024-04-25 DIAGNOSIS — Z5329 Procedure and treatment not carried out because of patient's decision for other reasons: Secondary | ICD-10-CM | POA: Insufficient documentation

## 2024-04-25 DIAGNOSIS — N186 End stage renal disease: Secondary | ICD-10-CM | POA: Insufficient documentation

## 2024-04-25 DIAGNOSIS — Z79899 Other long term (current) drug therapy: Secondary | ICD-10-CM | POA: Insufficient documentation

## 2024-04-25 DIAGNOSIS — Z79890 Hormone replacement therapy (postmenopausal): Secondary | ICD-10-CM | POA: Insufficient documentation

## 2024-04-25 DIAGNOSIS — N185 Chronic kidney disease, stage 5: Secondary | ICD-10-CM | POA: Insufficient documentation

## 2024-04-25 DIAGNOSIS — I509 Heart failure, unspecified: Secondary | ICD-10-CM | POA: Diagnosis not present

## 2024-04-25 DIAGNOSIS — Z992 Dependence on renal dialysis: Secondary | ICD-10-CM | POA: Insufficient documentation

## 2024-04-25 DIAGNOSIS — I132 Hypertensive heart and chronic kidney disease with heart failure and with stage 5 chronic kidney disease, or end stage renal disease: Secondary | ICD-10-CM | POA: Insufficient documentation

## 2024-04-25 DIAGNOSIS — D631 Anemia in chronic kidney disease: Secondary | ICD-10-CM | POA: Diagnosis not present

## 2024-04-25 DIAGNOSIS — I120 Hypertensive chronic kidney disease with stage 5 chronic kidney disease or end stage renal disease: Secondary | ICD-10-CM | POA: Diagnosis not present

## 2024-04-25 MED ORDER — CHLORHEXIDINE GLUCONATE CLOTH 2 % EX PADS
6.0000 | MEDICATED_PAD | Freq: Every day | CUTANEOUS | Status: DC
  Administered 2024-04-25: 6 via TOPICAL

## 2024-04-25 MED ORDER — DARBEPOETIN ALFA 150 MCG/0.3ML IJ SOSY
150.0000 ug | PREFILLED_SYRINGE | Freq: Once | INTRAMUSCULAR | Status: AC
  Administered 2024-04-25: 150 ug via SUBCUTANEOUS
  Filled 2024-04-25 (×2): qty 0.3

## 2024-04-25 MED ORDER — HEPARIN SODIUM (PORCINE) 1000 UNIT/ML DIALYSIS
2400.0000 [IU] | Freq: Once | INTRAMUSCULAR | Status: AC
  Administered 2024-04-25: 2400 [IU] via INTRAVENOUS_CENTRAL

## 2024-04-25 MED ORDER — ALTEPLASE 2 MG IJ SOLR: 2.0000 mg | Freq: Once | INTRAMUSCULAR | Status: DC | PRN

## 2024-04-25 MED ORDER — HEPARIN SODIUM (PORCINE) 1000 UNIT/ML DIALYSIS: 1000.0000 [IU] | INTRAMUSCULAR | Status: DC | PRN

## 2024-04-25 MED ORDER — SODIUM CHLORIDE 0.9 % IV SOLN
300.0000 mg | Freq: Once | INTRAVENOUS | Status: AC
  Administered 2024-04-25: 300 mg via INTRAVENOUS
  Filled 2024-04-25: qty 15

## 2024-04-25 MED ORDER — ANTICOAGULANT SODIUM CITRATE 4% (200MG/5ML) IV SOLN: 5.0000 mL | Status: DC | PRN

## 2024-04-25 MED ORDER — PENTAFLUOROPROP-TETRAFLUOROETH EX AERO: 1.0000 | INHALATION_SPRAY | CUTANEOUS | Status: DC | PRN

## 2024-04-25 MED ORDER — LIDOCAINE HCL (PF) 1 % IJ SOLN: 5.0000 mL | INTRAMUSCULAR | Status: DC | PRN

## 2024-04-25 MED ORDER — IRON SUCROSE 300 MG IVPB - SIMPLE MED
300.0000 mg | Freq: Once | Status: DC
  Filled 2024-04-25: qty 265

## 2024-04-25 MED ORDER — LIDOCAINE-PRILOCAINE 2.5-2.5 % EX CREA: 1.0000 | TOPICAL_CREAM | CUTANEOUS | Status: DC | PRN

## 2024-04-25 NOTE — ED Triage Notes (Signed)
 Pt is here for his routine hemodialysis treatment, no other complaints/ no distress

## 2024-04-25 NOTE — ED Provider Notes (Signed)
 "  EMERGENCY DEPARTMENT AT [facility] Provider Note   CSN: [id number] Arrival date & time: 04/25/24  9350     Patient presents with: needs dialysis   Paul [name] is a 58 y.o. male.   HPI   Patient has a history of hypertension congestive heart failure bipolar disorder, schizophrenia, chronic kidney disease on dialysis.  Patient presents to the ED for routine dialysis.  Patient is not able to receive dialysis at a dialysis center any longer.  He has to come to the ED.  This is the patient's 76 visit in the last 6 months.  Patient denies any complaints.  I did recently see him myself on the 17th after his dialysis session.  Patient started developing fevers.  He was diagnosed with influenza at that time.  Patient states he feels like that is getting better and he denies any complaints  Prior to Admission medications  Medication Sig Start Date End Date Taking? Authorizing Provider  ARIPiprazole  ER (ABILIFY  MAINTENA) 400 MG PRSY prefilled syringe Inject 400 mg into the muscle every 28 (twenty-eight) days. 03/24/24   Plovsky, Elna, MD  carvedilol  (COREG ) 6.25 MG tablet Take 1 tablet (6.25 mg total) by mouth 2 (two) times daily with a meal. 01/13/24   Delbert Clam, MD  isosorbide  mononitrate (IMDUR ) 30 MG 24 hr tablet Take 1 tablet (30 mg total) by mouth daily. 10/25/23   [name], MD  oseltamivir  (TAMIFLU ) 30 MG capsule Take 1 capsule by mouth after your next two dialysis sessions 04/21/24   Randol Simmonds, MD  OVER THE COUNTER MEDICATION Take 1 capsule by mouth daily. [employer] for memory    [provider]  rosuvastatin  (CRESTOR ) 5 MG tablet Take 1 tablet (5 mg total) by mouth daily. 03/24/23     sucroferric oxyhydroxide (VELPHORO ) 500 MG chewable tablet Chew 3 tablets (1,500 mg total) by mouth 3 (three) times daily with meals. Patient not taking: No sig reported 07/29/23   Heddy Barren, DO    Allergies: Penicillin g    Review of  Systems  Updated Vital Signs BP (!) 157/90 (BP Location: Left Arm)   Pulse 76   Temp 97.8 F (36.6 C) (Oral)   Resp 18   Ht 1.6 m (5' 3)   Wt 84.4 kg   SpO2 100%   BMI 32.95 kg/m   Physical Exam Vitals and nursing note reviewed.  Constitutional:      General: He is not in acute distress.    Appearance: He is well-developed.  HENT:     Head: Normocephalic and atraumatic.     Right Ear: External ear normal.     Left Ear: External ear normal.  Eyes:     General: No scleral icterus.       Right eye: No discharge.        Left eye: No discharge.     Conjunctiva/sclera: Conjunctivae normal.  Neck:     Trachea: No tracheal deviation.  Cardiovascular:     Rate and Rhythm: Normal rate.  Pulmonary:     Effort: Pulmonary effort is normal. No respiratory distress.     Breath sounds: No stridor.  Abdominal:     General: There is no distension.  Musculoskeletal:        General: No swelling or deformity.     Cervical back: Neck supple.  Skin:    General: Skin is warm and dry.     Findings: No rash.  Neurological:  Mental Status: He is alert. Mental status is at baseline.     Cranial Nerves: No dysarthria or facial asymmetry.     Motor: No seizure activity.     (all labs ordered are listed, but only abnormal results are displayed) Labs Reviewed - No data to display  EKG: None  Radiology: No results found.   Procedures   Medications Ordered in the ED  iron  sucrose (VENOFER ) 300 mg in sodium chloride  0.9 % 250 mL IVPB (has no administration in time range)  Darbepoetin Alfa  (ARANESP ) injection 150 mcg (has no administration in time range)  Chlorhexidine  Gluconate Cloth 2 % PADS 6 each (has no administration in time range)    Clinical Course as of 04/25/24 0946  Sun Apr 25, 2024  0944 Case discussed with Dr Geralynn.  Plan on dialysis today, anticipate dc after [JK]    Clinical Course User Index [JK] Randol Simmonds, MD                                 Medical  Decision Making Problems Addressed: Stage 5 chronic kidney disease on chronic dialysis [facility]): chronic illness or injury  Risk Decision regarding hospitalization.   Patient presents for routine dialysis.  Patient without complaints today.  He was recently diagnosed with influenza but feels like his symptoms are improving.  Plan will be for patient to receive dialysis today.  Anticipate discharge after that.     Final diagnoses:  Stage 5 chronic kidney disease on chronic dialysis [facility])    ED Discharge Orders     None          Randol Simmonds, MD 04/25/24 [phone]  "

## 2024-04-25 NOTE — Procedures (Addendum)
 Asked to see this patient for hospital dialysis. Pt was discharged from his outpatient unit for behavior issues. The plan will be for ED HD. Pt is not to be admitted at this time. Pt will go to the dialysis unit when they are ready for the patient. When dialysis is completed pt will be sent back to ED for reassessment.   Vitals:   04/25/24 0654 04/25/24 0655 04/25/24 0658  BP:   (!) 157/90  Pulse:   76  Resp:   18  Temp:   97.8 F (36.6 C)  TempSrc:   Oral  SpO2: 99%  100%  Weight:  84.4 kg   Height:  5' 3 (1.6 m)      Last OP HD unit info from 07/2023 -->  4h  B400 76.5kg  2K bath RUA AVF Heparin  2400    Clinical issues other than volume/ K+: 1) Anemia of esrd:  - pt dose not have a nephrologist or HD unit so does not have access to ESA's or IV iron , other than when here for hospital HD sessions - last tsat 15% on 03/20/24 - last venofer  doses were -->              - venofer  200mg  10/15  - venofer  300mg  12/08   - recent Hb's around 9- 10 - last 3 doses of darbepoeitin/ aranesp  were -->   - 150 mcg 10/29 - 150 mcg 11/05 - 150 mcg 04/12/24  Plan for today -->   - IV darbe and IV venofer  ordered to be given in HD today  I was present at the procedure, reviewed the HD regimen and made appropriate changes.   Myer Fret MD  [facility] 04/25/2024, 9:12 AM    Recent Labs  Lab 04/21/24 0835 04/21/24 1906  HGB 9.0* 9.8*  ALBUMIN 4.3  --   CALCIUM  7.5*  --   CREATININE 15.70*  --   K 5.7*  --    No results for input(s): IRON , TIBC, FERRITIN in the last 168 hours. Inpatient medications:  ARIPiprazole  ER  400 mg Intramuscular Q28 days

## 2024-04-25 NOTE — ED Notes (Signed)
 Transported to dialysis.

## 2024-04-25 NOTE — Progress Notes (Signed)
 2 Liters ultrafiltration, post hemodialysis condition stable, an hall assignment received to transport this patient post treatment.

## 2024-04-26 LAB — CULTURE, BLOOD (ROUTINE X 2)
Culture: NO GROWTH
Culture: NO GROWTH
Special Requests: ADEQUATE
Special Requests: ADEQUATE

## 2024-04-27 ENCOUNTER — Emergency Department (HOSPITAL_COMMUNITY)
Admission: EM | Admit: 2024-04-27 | Discharge: 2024-04-27 | Discharge status: Against Medical Advice | Attending: Nephrology | Admitting: Nephrology

## 2024-04-27 ENCOUNTER — Other Ambulatory Visit: Payer: Self-pay

## 2024-04-27 ENCOUNTER — Encounter (HOSPITAL_COMMUNITY): Payer: Self-pay

## 2024-04-27 DIAGNOSIS — N186 End stage renal disease: Secondary | ICD-10-CM | POA: Diagnosis not present

## 2024-04-27 DIAGNOSIS — Z5321 Procedure and treatment not carried out due to patient leaving prior to being seen by health care provider: Secondary | ICD-10-CM | POA: Insufficient documentation

## 2024-04-27 DIAGNOSIS — Z4931 Encounter for adequacy testing for hemodialysis: Secondary | ICD-10-CM | POA: Insufficient documentation

## 2024-04-27 DIAGNOSIS — Z992 Dependence on renal dialysis: Secondary | ICD-10-CM | POA: Insufficient documentation

## 2024-04-27 LAB — CBC
HCT: 26.4 % — ABNORMAL LOW (ref 39.0–52.0)
Hemoglobin: 8.6 g/dL — ABNORMAL LOW (ref 13.0–17.0)
MCH: 29.6 pg (ref 26.0–34.0)
MCHC: 32.6 g/dL (ref 30.0–36.0)
MCV: 90.7 fL (ref 80.0–100.0)
Platelets: 219 K/uL (ref 150–400)
RBC: 2.91 MIL/uL — ABNORMAL LOW (ref 4.22–5.81)
RDW: 18.3 % — ABNORMAL HIGH (ref 11.5–15.5)
WBC: 5.8 K/uL (ref 4.0–10.5)
nRBC: 0 % (ref 0.0–0.2)

## 2024-04-27 LAB — RENAL FUNCTION PANEL
Albumin: 4 g/dL (ref 3.5–5.0)
Anion gap: 19 — ABNORMAL HIGH (ref 5–15)
BUN: 75 mg/dL — ABNORMAL HIGH (ref 6–20)
CO2: 20 mmol/L — ABNORMAL LOW (ref 22–32)
Calcium: 7.1 mg/dL — ABNORMAL LOW (ref 8.9–10.3)
Chloride: 98 mmol/L (ref 98–111)
Creatinine, Ser: 10.1 mg/dL — ABNORMAL HIGH (ref 0.61–1.24)
GFR, Estimated: 5 mL/min — ABNORMAL LOW
Glucose, Bld: 86 mg/dL (ref 70–99)
Phosphorus: 8.6 mg/dL — ABNORMAL HIGH (ref 2.5–4.6)
Potassium: 4.8 mmol/L (ref 3.5–5.1)
Sodium: 137 mmol/L (ref 135–145)

## 2024-04-27 MED ORDER — CHLORHEXIDINE GLUCONATE CLOTH 2 % EX PADS: 6.0000 | MEDICATED_PAD | Freq: Every day | CUTANEOUS | Status: DC

## 2024-04-27 MED ORDER — HEPARIN SODIUM (PORCINE) 1000 UNIT/ML DIALYSIS: 1000.0000 [IU] | INTRAMUSCULAR | Status: DC | PRN

## 2024-04-27 MED ORDER — ALBUTEROL SULFATE (2.5 MG/3ML) 0.083% IN NEBU
INHALATION_SOLUTION | RESPIRATORY_TRACT | Status: AC
  Filled 2024-04-27: qty 3

## 2024-04-27 MED ORDER — LIDOCAINE-PRILOCAINE 2.5-2.5 % EX CREA: 1.0000 | TOPICAL_CREAM | CUTANEOUS | Status: DC | PRN

## 2024-04-27 MED ORDER — ANTICOAGULANT SODIUM CITRATE 4% (200MG/5ML) IV SOLN
5.0000 mL | Status: DC | PRN
  Filled 2024-04-27: qty 5

## 2024-04-27 MED ORDER — ALBUTEROL SULFATE (2.5 MG/3ML) 0.083% IN NEBU
2.5000 mg | INHALATION_SOLUTION | Freq: Once | RESPIRATORY_TRACT | Status: AC
  Administered 2024-04-27: 2.5 mg via RESPIRATORY_TRACT

## 2024-04-27 MED ORDER — ALTEPLASE 2 MG IJ SOLR: 2.0000 mg | Freq: Once | INTRAMUSCULAR | Status: DC | PRN

## 2024-04-27 MED ORDER — LIDOCAINE HCL (PF) 1 % IJ SOLN: 5.0000 mL | INTRAMUSCULAR | Status: DC | PRN

## 2024-04-27 MED ORDER — PENTAFLUOROPROP-TETRAFLUOROETH EX AERO: 1.0000 | INHALATION_SPRAY | CUTANEOUS | Status: DC | PRN

## 2024-04-27 MED ORDER — HEPARIN SODIUM (PORCINE) 1000 UNIT/ML IJ SOLN
INTRAMUSCULAR | Status: AC
  Filled 2024-04-27: qty 12

## 2024-04-27 MED ORDER — HEPARIN SODIUM (PORCINE) 1000 UNIT/ML DIALYSIS: 20.0000 [IU]/kg | INTRAMUSCULAR | Status: DC | PRN

## 2024-04-27 NOTE — Procedures (Signed)
 Patient was seen on dialysis and the procedure was supervised.  BFR 400  Via AVF BP is  126/83.  Received ESA on 12/21. Monitor labs.  Clinically stable.  UF around 2 L.  Discussed with the patient and dialysis nurse.   Patient appears to be tolerating treatment well.  Manson Luckadoo [name] 04/27/2024

## 2024-04-27 NOTE — ED Notes (Signed)
Transported to Dialysis

## 2024-04-27 NOTE — ED Triage Notes (Signed)
 Pt is here for his routine hemodialysis treatment. No other complaints/ no distress

## 2024-04-27 NOTE — Progress Notes (Signed)
 2.2 liters ultrafiltration, condition stable and report given and received from the ED charge rn to transport this patient to [facility] bed #9

## 2024-04-28 ENCOUNTER — Other Ambulatory Visit (HOSPITAL_COMMUNITY): Payer: Self-pay

## 2024-04-28 ENCOUNTER — Ambulatory Visit: Payer: Self-pay | Attending: *Deleted | Admitting: *Deleted

## 2024-04-28 ENCOUNTER — Other Ambulatory Visit: Payer: Self-pay

## 2024-04-28 VITALS — BP 111/73 | HR 82 | Temp 97.8°F | Ht 63.0 in | Wt 187.8 lb

## 2024-04-28 DIAGNOSIS — I151 Hypertension secondary to other renal disorders: Secondary | ICD-10-CM

## 2024-04-28 DIAGNOSIS — I120 Hypertensive chronic kidney disease with stage 5 chronic kidney disease or end stage renal disease: Secondary | ICD-10-CM

## 2024-04-28 DIAGNOSIS — N186 End stage renal disease: Secondary | ICD-10-CM

## 2024-04-28 MED ORDER — ISOSORBIDE MONONITRATE ER 30 MG PO TB24
30.0000 mg | ORAL_TABLET | Freq: Every day | ORAL | 0 refills | Status: AC
  Filled 2024-04-28: qty 90, 90d supply, fill #0
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
## 2024-04-28 NOTE — Patient Instructions (Signed)
 We discussed your blood pressure variability today. Your blood pressure is good today while in the clinic most likely because you took your medications as previously prescribed. You did discuss the problem with remembering to take your medications and will consider getting a med box. I did refill your Imdur  for 90 days. Also mention talking to the clinical pharmacist about labile blood pressure. I will have them call you to discuss.

## 2024-04-30 ENCOUNTER — Other Ambulatory Visit: Payer: Self-pay

## 2024-04-30 ENCOUNTER — Emergency Department (HOSPITAL_COMMUNITY): Admission: EM | Admit: 2024-04-30 | Discharge: 2024-04-30 | Disposition: A | Discharge status: Home / Self Care

## 2024-04-30 DIAGNOSIS — Z992 Dependence on renal dialysis: Secondary | ICD-10-CM | POA: Diagnosis present

## 2024-04-30 DIAGNOSIS — I509 Heart failure, unspecified: Secondary | ICD-10-CM | POA: Diagnosis not present

## 2024-04-30 LAB — BASIC METABOLIC PANEL WITH GFR
Anion gap: 23 — ABNORMAL HIGH (ref 5–15)
BUN: 82 mg/dL — ABNORMAL HIGH (ref 6–20)
CO2: 20 mmol/L — ABNORMAL LOW (ref 22–32)
Calcium: 7.1 mg/dL — ABNORMAL LOW (ref 8.9–10.3)
Chloride: 95 mmol/L — ABNORMAL LOW (ref 98–111)
Creatinine, Ser: 12 mg/dL — ABNORMAL HIGH (ref 0.61–1.24)
GFR, Estimated: 4 mL/min — ABNORMAL LOW
Glucose, Bld: 95 mg/dL (ref 70–99)
Potassium: 4.6 mmol/L (ref 3.5–5.1)
Sodium: 138 mmol/L (ref 135–145)

## 2024-04-30 LAB — CBC WITH DIFFERENTIAL/PLATELET
Abs Immature Granulocytes: 0.05 K/uL (ref 0.00–0.07)
Basophils Absolute: 0.1 K/uL (ref 0.0–0.1)
Basophils Relative: 1 %
Eosinophils Absolute: 0.6 K/uL — ABNORMAL HIGH (ref 0.0–0.5)
Eosinophils Relative: 7 %
HCT: 28.3 % — ABNORMAL LOW (ref 39.0–52.0)
Hemoglobin: 9.3 g/dL — ABNORMAL LOW (ref 13.0–17.0)
Immature Granulocytes: 1 %
Lymphocytes Relative: 16 %
Lymphs Abs: 1.3 K/uL (ref 0.7–4.0)
MCH: 30.3 pg (ref 26.0–34.0)
MCHC: 32.9 g/dL (ref 30.0–36.0)
MCV: 92.2 fL (ref 80.0–100.0)
Monocytes Absolute: 0.8 K/uL (ref 0.1–1.0)
Monocytes Relative: 10 %
Neutro Abs: 5.2 K/uL (ref 1.7–7.7)
Neutrophils Relative %: 65 %
Platelets: 302 K/uL (ref 150–400)
RBC: 3.07 MIL/uL — ABNORMAL LOW (ref 4.22–5.81)
RDW: 18.7 % — ABNORMAL HIGH (ref 11.5–15.5)
WBC: 7.9 K/uL (ref 4.0–10.5)
nRBC: 0.3 % — ABNORMAL HIGH (ref 0.0–0.2)

## 2024-04-30 MED ORDER — CHLORHEXIDINE GLUCONATE CLOTH 2 % EX PADS: 6.0000 | MEDICATED_PAD | Freq: Every day | CUTANEOUS | Status: DC

## 2024-04-30 MED ORDER — HEPARIN SODIUM (PORCINE) 1000 UNIT/ML IJ SOLN
INTRAMUSCULAR | Status: AC
  Filled 2024-04-30: qty 2

## 2024-04-30 MED ORDER — HEPARIN SODIUM (PORCINE) 1000 UNIT/ML DIALYSIS: 20.0000 [IU]/kg | INTRAMUSCULAR | Status: DC | PRN

## 2024-04-30 NOTE — Procedures (Addendum)
 Patient was seen on dialysis and the procedure was supervised.  BFR 400  Via AVF BP is  143/105.   Patient appears to be tolerating treatment well. UF as tolerated.  Hb stable.  Discussed with the renal navigator.  Paul [name] [name] 04/30/2024

## 2024-04-30 NOTE — ED Triage Notes (Signed)
 Patient is here for his routine hemodialysis treatment . No other complaints . Ambulatory/respirations unlabored .

## 2024-04-30 NOTE — ED Notes (Signed)
 Pt refused collection of outstanding labs

## 2024-04-30 NOTE — ED Provider Notes (Signed)
 Handoff from --  Here for dialysis - denies missed dialysis appointments - last 12/23 Pending labs - nephrology consult - dialysis then discharge   Physical Exam  BP (!) 165/90 (BP Location: Left Arm)   Pulse 82   Temp 97.7 F (36.5 C)   Resp 18   SpO2 98%   Physical Exam  Procedures  Procedures  ED Course / MDM   Medical Decision Making Amount and/or Complexity of Data Reviewed Labs: ordered. Decision-making details documented in ED Course. ECG/medicine tests:  Decision-making details documented in ED Course.   Patient presents to the ED for: Dialysis  Clinical Course as of 04/30/24 1640  Fri Apr 30, 2024  0657 Temp: 97.7 F (36.5 C) Afebrile, vital stable, patient in no acute distress [ML]  [phone] ED EKG Sinus rhythm [ML]  0702 CBC with Differential(!) Per patient baseline [ML]  0743 Basic metabolic panel(!) Per patient baseline [ML]  1037 Temp(!): 97.4 F (36.3 C) [ML]    Clinical Course User Index [ML] Willma Duwaine CROME, PA    Data Reviewed / Actions Taken: Labs ordered/reviewed with my independent interpretation in ED course above.  ED Course / Reassessments: Problem List: Dialysis 58 year old male presented for dialysis. Initial assessment included history, physical exam, and review of prior medical records.  Patient taken by nephrology for dialysis and pending discharge after returning.   Consultations:  Nephrology - Dolan, MD Consult recommendations incorporated into plan: patient will receive dialysis with service.  Disposition: Disposition: Discharge pending - handoff to provider GLENWOOD Hamilton, MD  Rationale for disposition: Patient was taken by nephrology team for dialysis - to return to ED after for discharge.   This note was produced using Electronics Engineer. While I have reviewed and verified all clinical information, transcription errors may remain.        Willma Duwaine CROME, [place] 04/30/24 1647    [name], DO 04/30/24  [phone]

## 2024-04-30 NOTE — ED Notes (Signed)
Transported to Dialysis

## 2024-04-30 NOTE — ED Provider Notes (Signed)
 " [place] [facility] Provider Note   CSN: [id number] Arrival date & time: 04/30/24  9451     Patient presents with: Hemodialysis   Paul [name] is a 58 y.o. male patient is here with greater than 70 ED visits in the last 6 months, presents for routine hemodialysis as he is no longer able to receive dialysis in the outpatient setting .  Last dialysis was Tuesday 12/23.  History of CHF, schizophrenia.  Recently recovered from influenza.  No shortness of breath or acute symptoms.   HPI     Prior to Admission medications  Medication Sig Start Date End Date Taking? Authorizing Provider  ARIPiprazole  ER (ABILIFY  MAINTENA) 400 MG PRSY prefilled syringe Inject 400 mg into the muscle every 28 (twenty-eight) days. 03/24/24   Plovsky, Elna, MD  carvedilol  (COREG ) 6.25 MG tablet Take 1 tablet (6.25 mg total) by mouth 2 (two) times daily with a meal. 01/13/24   Delbert Clam, MD  isosorbide  mononitrate (IMDUR ) 30 MG 24 hr tablet Take 1 tablet (30 mg total) by mouth daily. 04/28/24   Placey, Ronal Caldron, NP  oseltamivir  (TAMIFLU ) 30 MG capsule Take 1 capsule by mouth after your next two dialysis sessions Patient not taking: Reported on 04/28/2024 04/21/24   Randol Simmonds, MD  OVER THE COUNTER MEDICATION Take 1 capsule by mouth daily. Lion's Mane for memory    [provider]  rosuvastatin  (CRESTOR ) 5 MG tablet Take 1 tablet (5 mg total) by mouth daily. 03/24/23     sucroferric oxyhydroxide (VELPHORO ) 500 MG chewable tablet Chew 3 tablets (1,500 mg total) by mouth 3 (three) times daily with meals. Patient not taking: Reported on 04/28/2024 07/29/23   Tawkaliyar, Roya, DO    Allergies: Penicillin g    Review of Systems  All other systems reviewed and are negative.   Updated Vital Signs BP (!) 165/90 (BP Location: Left Arm)   Pulse 82   Temp 97.7 F (36.5 C)   Resp 18   SpO2 98%   Physical Exam Vitals and nursing note reviewed.   Constitutional:      Appearance: He is not ill-appearing or toxic-appearing.  HENT:     Head: Normocephalic and atraumatic.  Eyes:     General: No scleral icterus.       Right eye: No discharge.        Left eye: No discharge.     Conjunctiva/sclera: Conjunctivae normal.  Cardiovascular:     Pulses: Normal pulses.  Pulmonary:     Effort: Pulmonary effort is normal.     Breath sounds: Normal breath sounds.  Abdominal:     Palpations: Abdomen is soft.  Musculoskeletal:     Right lower leg: No edema.     Left lower leg: No edema.  Skin:    General: Skin is warm and dry.  Neurological:     General: No focal deficit present.     Mental Status: He is alert.  Psychiatric:        Mood and Affect: Mood normal.     (all labs ordered are listed, but only abnormal results are displayed) Labs Reviewed  CBC WITH DIFFERENTIAL/PLATELET  BASIC METABOLIC PANEL WITH GFR    EKG: None  Radiology: No results found.   Procedures   Medications Ordered in the ED - No data to display  Clinical Course as of 04/30/24 0645  Fri Apr 30, 2024  9365 Handoff - Labs + nephro  [ML]  Clinical Course User Index [ML] Willma Duwaine CROME, [place]                                 Medical Decision Making 58 year old male presents with concern for needing dialysis  Hypertensive on intake vital signs normal.  Cardiopulmonary abdominal exams are benign.  Patient well-appearing.  Amount and/or Complexity of Data Reviewed Labs: ordered.   Basic labs ordered, patient will require nephrology consult following labs and EKG this morning. Care of this patient signed out to oncoming ED provider M. Lloyd, PA-C at time of shift change. All pertinent HPI, physical exam, and laboratory findings were discussed with them prior to my departure. Disposition of patient pending completion of workup, reevaluation, and clinical judgement of oncoming ED provider.    This chart was dictated using voice recognition  software, Dragon. Despite the best efforts of this provider to proofread and correct errors, errors may still occur which can change documentation meaning.      Final diagnoses:  None    ED Discharge Orders     None          Bobette Pleasant JONELLE DEVONNA 04/30/24 0645    Bari Charmaine FALCON, MD 05/02/24 2311  "

## 2024-05-03 ENCOUNTER — Other Ambulatory Visit: Payer: Self-pay

## 2024-05-03 ENCOUNTER — Emergency Department (HOSPITAL_COMMUNITY)
Admission: EM | Admit: 2024-05-03 | Discharge: 2024-05-03 | Discharge status: Against Medical Advice | Attending: Nephrology | Admitting: Nephrology

## 2024-05-03 ENCOUNTER — Encounter (HOSPITAL_COMMUNITY): Payer: Self-pay | Admitting: *Deleted

## 2024-05-03 ENCOUNTER — Other Ambulatory Visit (HOSPITAL_COMMUNITY): Payer: Self-pay

## 2024-05-03 DIAGNOSIS — Z452 Encounter for adjustment and management of vascular access device: Secondary | ICD-10-CM | POA: Diagnosis present

## 2024-05-03 DIAGNOSIS — Z5321 Procedure and treatment not carried out due to patient leaving prior to being seen by health care provider: Secondary | ICD-10-CM | POA: Insufficient documentation

## 2024-05-03 LAB — HEPATITIS B SURFACE ANTIGEN: Hepatitis B Surface Ag: NONREACTIVE

## 2024-05-03 MED ORDER — ALTEPLASE 2 MG IJ SOLR: 2.0000 mg | Freq: Once | INTRAMUSCULAR | Status: DC | PRN

## 2024-05-03 MED ORDER — LIDOCAINE HCL (PF) 1 % IJ SOLN: 5.0000 mL | INTRAMUSCULAR | Status: DC | PRN

## 2024-05-03 MED ORDER — HEPARIN SODIUM (PORCINE) 1000 UNIT/ML DIALYSIS: 1000.0000 [IU] | INTRAMUSCULAR | Status: DC | PRN

## 2024-05-03 MED ORDER — PENTAFLUOROPROP-TETRAFLUOROETH EX AERO: 1.0000 | INHALATION_SPRAY | CUTANEOUS | Status: DC | PRN

## 2024-05-03 MED ORDER — ANTICOAGULANT SODIUM CITRATE 4% (200MG/5ML) IV SOLN: 5.0000 mL | Status: DC | PRN

## 2024-05-03 MED ORDER — CHLORHEXIDINE GLUCONATE CLOTH 2 % EX PADS: 6.0000 | MEDICATED_PAD | Freq: Every day | CUTANEOUS | Status: DC

## 2024-05-03 MED ORDER — LIDOCAINE-PRILOCAINE 2.5-2.5 % EX CREA: 1.0000 | TOPICAL_CREAM | CUTANEOUS | Status: DC | PRN

## 2024-05-03 NOTE — ED Provider Triage Note (Signed)
 Emergency Medicine Provider Triage Evaluation Note  Paul [name] , a 58 y.o. male  was evaluated in triage.  Pt complains of here for routine dialysis.  No new complaints.  Well-known to this department..  Review of Systems  Positive: Dialysis need Negative: Fever  Physical Exam  BP (!) 134/91 (BP Location: Left Arm)   Pulse 71   Temp 98 F (36.7 C) (Oral)   Resp 16   Ht 5' 3 (1.6 m)   Wt 78.7 kg   SpO2 100%   BMI 30.73 kg/m  Gen:   Awake, no distress   Resp:  Normal effort  MSK:   Moves extremities without difficulty  Other:    Medical Decision Making  Medically screening exam initiated at 1:02 PM.  Appropriate orders placed.  Paul [name] was informed that the remainder of the evaluation will be completed by another provider, this initial triage assessment does not replace that evaluation, and the importance of remaining in the ED until their evaluation is complete.     Paul Warren SAILOR, PA-C 05/03/24 8696

## 2024-05-03 NOTE — ED Notes (Signed)
Dialysis aware patient is here.

## 2024-05-03 NOTE — ED Triage Notes (Signed)
 Patient presents to ed for routine  dialysis

## 2024-05-03 NOTE — Progress Notes (Signed)
" °   05/03/24 1654  Vitals  Temp 97.6 F (36.4 C)  BP (!) 128/92  Pulse Rate 86  Resp 12  Weight 76.4 kg  Oxygen Therapy  SpO2 97 %  O2 Device Room Air  Patient Activity (if Appropriate) In bed  Pulse Oximetry Type Continuous  Oximetry Probe Site Changed No  Post Treatment  Dialyzer Clearance Heavily streaked  Hemodialysis Intake (mL) 0 mL  Liters Processed 74.9  Fluid Removed (mL) 2300 mL  Tolerated HD Treatment Yes  AVG/AVF Arterial Site Held (minutes) 6 minutes  AVG/AVF Venous Site Held (minutes) 6 minutes    "

## 2024-05-04 ENCOUNTER — Other Ambulatory Visit: Payer: Self-pay

## 2024-05-04 LAB — HEPATITIS B SURFACE ANTIBODY, QUANTITATIVE: Hep B S AB Quant (Post): 5380 m[IU]/mL

## 2024-05-05 ENCOUNTER — Emergency Department (HOSPITAL_COMMUNITY)
Admission: EM | Admit: 2024-05-05 | Discharge: 2024-05-05 | Discharge status: Against Medical Advice | Attending: Emergency Medicine | Admitting: Emergency Medicine

## 2024-05-05 ENCOUNTER — Ambulatory Visit: Payer: Self-pay | Admitting: Pharmacist

## 2024-05-05 ENCOUNTER — Other Ambulatory Visit: Payer: Self-pay

## 2024-05-05 DIAGNOSIS — Z5329 Procedure and treatment not carried out because of patient's decision for other reasons: Secondary | ICD-10-CM | POA: Diagnosis not present

## 2024-05-05 DIAGNOSIS — Z992 Dependence on renal dialysis: Secondary | ICD-10-CM | POA: Diagnosis not present

## 2024-05-05 DIAGNOSIS — N186 End stage renal disease: Secondary | ICD-10-CM | POA: Insufficient documentation

## 2024-05-05 DIAGNOSIS — D631 Anemia in chronic kidney disease: Secondary | ICD-10-CM | POA: Insufficient documentation

## 2024-05-05 LAB — CBC
HCT: 29.2 % — ABNORMAL LOW (ref 39.0–52.0)
Hemoglobin: 9.8 g/dL — ABNORMAL LOW (ref 13.0–17.0)
MCH: 30.5 pg (ref 26.0–34.0)
MCHC: 33.6 g/dL (ref 30.0–36.0)
MCV: 91 fL (ref 80.0–100.0)
Platelets: 307 K/uL (ref 150–400)
RBC: 3.21 MIL/uL — ABNORMAL LOW (ref 4.22–5.81)
RDW: 19.6 % — ABNORMAL HIGH (ref 11.5–15.5)
WBC: 7.6 K/uL (ref 4.0–10.5)
nRBC: 0 % (ref 0.0–0.2)

## 2024-05-05 LAB — RENAL FUNCTION PANEL
Albumin: 4.4 g/dL (ref 3.5–5.0)
Anion gap: 23 — ABNORMAL HIGH (ref 5–15)
BUN: 65 mg/dL — ABNORMAL HIGH (ref 6–20)
CO2: 22 mmol/L (ref 22–32)
Calcium: 7.9 mg/dL — ABNORMAL LOW (ref 8.9–10.3)
Chloride: 95 mmol/L — ABNORMAL LOW (ref 98–111)
Creatinine, Ser: 11.8 mg/dL — ABNORMAL HIGH (ref 0.61–1.24)
GFR, Estimated: 4 mL/min — ABNORMAL LOW
Glucose, Bld: 133 mg/dL — ABNORMAL HIGH (ref 70–99)
Phosphorus: 11 mg/dL — ABNORMAL HIGH (ref 2.5–4.6)
Potassium: 4.5 mmol/L (ref 3.5–5.1)
Sodium: 139 mmol/L (ref 135–145)

## 2024-05-05 MED ORDER — DARBEPOETIN ALFA 100 MCG/0.5ML IJ SOSY
100.0000 ug | PREFILLED_SYRINGE | Freq: Once | INTRAMUSCULAR | Status: AC
  Administered 2024-05-05: 100 ug via SUBCUTANEOUS
  Filled 2024-05-05: qty 0.5

## 2024-05-05 MED ORDER — PENTAFLUOROPROP-TETRAFLUOROETH EX AERO: 1.0000 | INHALATION_SPRAY | CUTANEOUS | Status: DC | PRN

## 2024-05-05 MED ORDER — CHLORHEXIDINE GLUCONATE CLOTH 2 % EX PADS: 6.0000 | MEDICATED_PAD | Freq: Every day | CUTANEOUS | Status: DC

## 2024-05-05 MED ORDER — ALTEPLASE 2 MG IJ SOLR: 2.0000 mg | Freq: Once | INTRAMUSCULAR | Status: DC | PRN

## 2024-05-05 MED ORDER — LIDOCAINE-PRILOCAINE 2.5-2.5 % EX CREA: 1.0000 | TOPICAL_CREAM | CUTANEOUS | Status: DC | PRN

## 2024-05-05 MED ORDER — ANTICOAGULANT SODIUM CITRATE 4% (200MG/5ML) IV SOLN: 5.0000 mL | Status: DC | PRN

## 2024-05-05 MED ORDER — HEPARIN SODIUM (PORCINE) 1000 UNIT/ML DIALYSIS: 20.0000 [IU]/kg | INTRAMUSCULAR | Status: DC | PRN

## 2024-05-05 MED ORDER — HEPARIN SODIUM (PORCINE) 1000 UNIT/ML DIALYSIS: 1000.0000 [IU] | INTRAMUSCULAR | Status: DC | PRN

## 2024-05-05 MED ORDER — HEPARIN SODIUM (PORCINE) 1000 UNIT/ML IJ SOLN
INTRAMUSCULAR | Status: AC
  Filled 2024-05-05: qty 4

## 2024-05-05 MED ORDER — LIDOCAINE HCL (PF) 1 % IJ SOLN: 5.0000 mL | INTRAMUSCULAR | Status: DC | PRN

## 2024-05-05 NOTE — ED Provider Notes (Signed)
 " [name] Provider Note   CSN: [id number] Arrival date & time: 05/05/24  0800     Patient presents with: Vascular Access Problem and need dialysis   Paul [name] is a 58 y.o. male.   Patient is here for dialysis.  Patient comes to the emergency department for dialysis on Monday Wednesday and Fridays.  Patient reports he completed his dialysis on Monday.  Patient states he is here to have his dialysis he denies any current problems.  He is not running a fever or chills he has not had any cough or congestion.  Patient denies any fluid overload  The history is provided by the patient. No language interpreter was used.       Prior to Admission medications  Medication Sig Start Date End Date Taking? Authorizing Provider  ARIPiprazole  ER (ABILIFY  MAINTENA) 400 MG PRSY prefilled syringe Inject 400 mg into the muscle every 28 (twenty-eight) days. 03/24/24   Plovsky, Elna, MD  carvedilol  (COREG ) 6.25 MG tablet Take 1 tablet (6.25 mg total) by mouth 2 (two) times daily with a meal. 01/13/24   Delbert Clam, MD  isosorbide  mononitrate (IMDUR ) 30 MG 24 hr tablet Take 1 tablet (30 mg total) by mouth daily. 04/28/24   Placey, Ronal Caldron, NP  oseltamivir  (TAMIFLU ) 30 MG capsule Take 1 capsule by mouth after your next two dialysis sessions Patient not taking: Reported on 04/28/2024 04/21/24   Randol Simmonds, MD  OVER THE COUNTER MEDICATION Take 1 capsule by mouth daily. Lion's Mane for memory    [provider]  rosuvastatin  (CRESTOR ) 5 MG tablet Take 1 tablet (5 mg total) by mouth daily. 03/24/23     sucroferric oxyhydroxide (VELPHORO ) 500 MG chewable tablet Chew 3 tablets (1,500 mg total) by mouth 3 (three) times daily with meals. Patient not taking: Reported on 04/28/2024 07/29/23   Tawkaliyar, Roya, DO    Allergies: Penicillin g    Review of Systems  All other systems reviewed and are negative.   Updated Vital Signs BP 116/72 (BP  Location: Left Arm)   Pulse 82   Temp 97.9 F (36.6 C) (Oral)   Resp 18   SpO2 97%   Physical Exam Vitals and nursing note reviewed.  Constitutional:      Appearance: He is well-developed.  HENT:     Head: Normocephalic.  Cardiovascular:     Rate and Rhythm: Normal rate.  Pulmonary:     Effort: Pulmonary effort is normal.  Abdominal:     General: There is no distension.  Musculoskeletal:        General: Normal range of motion.  Skin:    General: Skin is warm.  Neurological:     General: No focal deficit present.     Mental Status: He is alert and oriented to person, place, and time.     (all labs ordered are listed, but only abnormal results are displayed) Labs Reviewed - No data to display  EKG: None  Radiology: No results found.   Procedures   Medications Ordered in the ED - No data to display                                  Medical Decision Making Patient is here for dialysis he receives his dialysis here Monday Wednesday Friday he had normal dialysis on Friday  Amount and/or Complexity of Data Reviewed Discussion of management  or test interpretation with external provider(s): I spoke with Dr. Jerrye nephrologist who will set patient up for dialysis.        Final diagnoses:  ESRD (end stage renal disease) on dialysis [facility])    ED Discharge Orders     None          Flint Sonny POUR, PA-C 05/05/24 9176    Bernard Drivers, MD 05/05/24 [phone]  "

## 2024-05-05 NOTE — Progress Notes (Signed)
" °   05/05/24 1338  Vitals  Temp (!) 97.5 F (36.4 C)  Temp Source Oral  BP (!) 99/56  MAP (mmHg) 71  BP Location Left Arm  BP Method Automatic  Patient Position (if appropriate) Sitting  Pulse Rate 73  Pulse Rate Source Monitor  ECG Heart Rate 77  Resp 15  Weight 83.9 kg  Type of Weight Post-Dialysis  Oxygen Therapy  SpO2 100 %  O2 Device Room Air  Pulse Oximetry Type Continuous  During Treatment Monitoring  Blood Flow Rate (mL/min) 399 mL/min  Arterial Pressure (mmHg) -198.17 mmHg  Venous Pressure (mmHg) 233.52 mmHg  TMP (mmHg) 6.26 mmHg  Ultrafiltration Rate (mL/min) 0 mL/min  Dialysate Flow Rate (mL/min) 299 ml/min  Duration of HD Treatment -hour(s) 3.47 hour(s)  Cumulative Fluid Removed (mL) per Treatment  652.34  HD Safety Checks Performed Yes  Intra-Hemodialysis Comments Tolerated well;Tx completed  Post Treatment  Dialyzer Clearance Clear  Liters Processed 84  Fluid Removed (mL) 700 mL  Tolerated HD Treatment Yes  AVG/AVF Arterial Site Held (minutes) 8 minutes  AVG/AVF Venous Site Held (minutes) 8 minutes  Fistula / Graft Right Upper arm Arteriovenous fistula  No placement date or time found.   Placed prior to admission: Yes  Orientation: Right  Access Location: Upper arm  Access Type: Arteriovenous fistula  Site Condition No complications  Fistula / Graft Assessment Present;Thrill;Bruit  Status Accessed  Drainage Description None    "

## 2024-05-05 NOTE — ED Triage Notes (Signed)
 Pt. Stated, I need dialysis. Last treatment Monday .completed

## 2024-05-05 NOTE — ED Notes (Signed)
Pt returned from dialysis and walked out.

## 2024-05-07 ENCOUNTER — Encounter (HOSPITAL_COMMUNITY): Payer: Self-pay

## 2024-05-07 ENCOUNTER — Other Ambulatory Visit: Payer: Self-pay

## 2024-05-07 ENCOUNTER — Emergency Department (HOSPITAL_COMMUNITY)
Admission: EM | Admit: 2024-05-07 | Discharge: 2024-05-07 | Discharge status: Against Medical Advice | Attending: Emergency Medicine | Admitting: Emergency Medicine

## 2024-05-07 DIAGNOSIS — Z992 Dependence on renal dialysis: Secondary | ICD-10-CM | POA: Insufficient documentation

## 2024-05-07 MED ORDER — HEPARIN SODIUM (PORCINE) 1000 UNIT/ML DIALYSIS: 1000.0000 [IU] | INTRAMUSCULAR | Status: DC | PRN

## 2024-05-07 MED ORDER — PENTAFLUOROPROP-TETRAFLUOROETH EX AERO: 1.0000 | INHALATION_SPRAY | CUTANEOUS | Status: DC | PRN

## 2024-05-07 MED ORDER — ALTEPLASE 2 MG IJ SOLR: 2.0000 mg | Freq: Once | INTRAMUSCULAR | Status: DC | PRN

## 2024-05-07 MED ORDER — CHLORHEXIDINE GLUCONATE CLOTH 2 % EX PADS: 6.0000 | MEDICATED_PAD | Freq: Every day | CUTANEOUS | Status: DC

## 2024-05-07 MED ORDER — LIDOCAINE HCL (PF) 1 % IJ SOLN: 5.0000 mL | INTRAMUSCULAR | Status: DC | PRN

## 2024-05-07 MED ORDER — LIDOCAINE-PRILOCAINE 2.5-2.5 % EX CREA: 1.0000 | TOPICAL_CREAM | CUTANEOUS | Status: DC | PRN

## 2024-05-07 MED ORDER — HEPARIN SODIUM (PORCINE) 1000 UNIT/ML IJ SOLN
INTRAMUSCULAR | Status: AC
  Filled 2024-05-07: qty 5

## 2024-05-07 MED ORDER — ANTICOAGULANT SODIUM CITRATE 4% (200MG/5ML) IV SOLN: 5.0000 mL | Status: DC | PRN

## 2024-05-07 NOTE — ED Triage Notes (Signed)
 Here for his regular dialysis MWF last treatment Wednesday. No distress noted.

## 2024-05-07 NOTE — Progress Notes (Signed)
" °   05/07/24 1639  Vitals  Temp 97.8 F (36.6 C)  Temp Source Oral  BP 116/75  MAP (mmHg) 87  BP Location Left Arm  BP Method Automatic  Patient Position (if appropriate) Sitting  Pulse Rate 83  ECG Heart Rate 82  Resp 15  Weight 84.7 kg  Type of Weight Post-Dialysis  Oxygen Therapy  SpO2 100 %  O2 Device Room Air  During Treatment Monitoring  Blood Flow Rate (mL/min) 349 mL/min  Arterial Pressure (mmHg) -113.33 mmHg  Venous Pressure (mmHg) 233.73 mmHg  TMP (mmHg) 26.66 mmHg  Ultrafiltration Rate (mL/min) 937 mL/min  Dialysate Flow Rate (mL/min) 300 ml/min  Duration of HD Treatment -hour(s) 3.44 hour(s)  Cumulative Fluid Removed (mL) per Treatment  2442.2  HD Safety Checks Performed Yes  Intra-Hemodialysis Comments Tx completed;Tolerated well  Post Treatment  Dialyzer Clearance Clear  Liters Processed 77.5  Fluid Removed (mL) 2500 mL  Tolerated HD Treatment Yes  Fistula / Graft Right Upper arm Arteriovenous fistula  No placement date or time found.   Placed prior to admission: Yes  Orientation: Right  Access Location: Upper arm  Access Type: Arteriovenous fistula  Site Condition No complications  Fistula / Graft Assessment Present;Thrill;Bruit  Status Deaccessed  Drainage Description None    "

## 2024-05-07 NOTE — ED Provider Notes (Signed)
 " [place] EMERGENCY DEPARTMENT AT [facility] Provider Note   CSN: [id number] Arrival date & time: 05/07/24  9368     Patient presents with: Need for regular dialysis  Paul [name] is a 59 y.o. male who presents to the ED today asymptomatic for his regular dialysis.  On normal Monday Wednesday Friday scheduling for dialysis and presents for his dialysis treatment today.   HPI     Prior to Admission medications  Medication Sig Start Date End Date Taking? Authorizing Provider  ARIPiprazole  ER (ABILIFY  MAINTENA) 400 MG PRSY prefilled syringe Inject 400 mg into the muscle every 28 (twenty-eight) days. 03/24/24   Plovsky, Elna, MD  carvedilol  (COREG ) 6.25 MG tablet Take 1 tablet (6.25 mg total) by mouth 2 (two) times daily with a meal. 01/13/24   Delbert Clam, MD  isosorbide  mononitrate (IMDUR ) 30 MG 24 hr tablet Take 1 tablet (30 mg total) by mouth daily. 04/28/24   Placey, Ronal Caldron, NP  oseltamivir  (TAMIFLU ) 30 MG capsule Take 1 capsule by mouth after your next two dialysis sessions Patient not taking: Reported on 04/28/2024 04/21/24   Randol Simmonds, MD  OVER THE COUNTER MEDICATION Take 1 capsule by mouth daily. Lion's Mane for memory    [provider]  rosuvastatin  (CRESTOR ) 5 MG tablet Take 1 tablet (5 mg total) by mouth daily. 03/24/23     sucroferric oxyhydroxide (VELPHORO ) 500 MG chewable tablet Chew 3 tablets (1,500 mg total) by mouth 3 (three) times daily with meals. Patient not taking: Reported on 04/28/2024 07/29/23   Tawkaliyar, Roya, DO    Allergies: Penicillin g    Review of Systems  Constitutional:  Negative for chills and fever.  HENT:  Negative for sore throat.   Eyes:  Negative for visual disturbance.  Respiratory:  Negative for cough and shortness of breath.   Cardiovascular:  Negative for chest pain.  Gastrointestinal:  Negative for abdominal pain, diarrhea, nausea and vomiting.  Genitourinary:  Negative for dysuria and frequency.   Musculoskeletal:  Negative for back pain and neck pain.  Skin:  Negative for rash.  Neurological:  Negative for weakness, numbness and headaches.  Hematological:  Negative for adenopathy.  Psychiatric/Behavioral:  Negative for behavioral problems.     Updated Vital Signs BP 121/78 (BP Location: Left Arm)   Pulse 84   Temp 98.5 F (36.9 C) (Oral)   Resp 18   Ht 5' 3 (1.6 m)   Wt 83.9 kg   SpO2 96%   BMI 32.77 kg/m   Physical Exam Vitals and nursing note reviewed.  Constitutional:      General: He is not in acute distress.    Appearance: Normal appearance. He is well-developed.  HENT:     Head: Normocephalic and atraumatic.  Eyes:     Extraocular Movements: Extraocular movements intact.     Conjunctiva/sclera: Conjunctivae normal.     Pupils: Pupils are equal, round, and reactive to light.  Cardiovascular:     Rate and Rhythm: Normal rate and regular rhythm.     Heart sounds: No murmur heard. Pulmonary:     Effort: Pulmonary effort is normal. No respiratory distress.     Breath sounds: Normal breath sounds.  Abdominal:     Palpations: Abdomen is soft.     Tenderness: There is no abdominal tenderness.  Musculoskeletal:        General: No swelling. Normal range of motion.     Cervical back: Neck supple.  Skin:    General:  Skin is warm and dry.     Capillary Refill: Capillary refill takes less than 2 seconds.  Neurological:     General: No focal deficit present.     Mental Status: He is alert and oriented to person, place, and time. Mental status is at baseline.  Psychiatric:        Mood and Affect: Mood normal.        Behavior: Behavior normal.     (all labs ordered are listed, but only abnormal results are displayed) Labs Reviewed  HEPATITIS B SURFACE ANTIGEN  HEPATITIS B SURFACE ANTIBODY, QUANTITATIVE    EKG: None  Radiology: No results found.   Procedures   Medications Ordered in the ED  Chlorhexidine  Gluconate Cloth 2 % PADS 6 each (has no  administration in time range)                                    Medical Decision Making  Secondary to request for regular dialysis treatment, orders placed for nephrology consultation for day admission for treatment.  Consulted with nephrology who will accept this patient for dialysis treatment.  Question have with Dr. Jerrye with nephrology.     Final diagnoses:  Encounter for dialysis    ED Discharge Orders     None          Myriam Dorn BROCKS, [place] 05/07/24 1034    Mesner, Selinda, MD 05/08/24 [phone]  "

## 2024-05-07 NOTE — ED Notes (Signed)
 Pt returned from dialysis and left prior to Ed Provider reasessment

## 2024-05-07 NOTE — ED Provider Notes (Signed)
 Spoke w/ Dr Jerrye who will place orders for HD later today     Elnor Jayson LABOR, DO 05/07/24 9083

## 2024-05-10 ENCOUNTER — Encounter (HOSPITAL_COMMUNITY): Payer: Self-pay | Admitting: *Deleted

## 2024-05-10 ENCOUNTER — Emergency Department (HOSPITAL_COMMUNITY)
Admission: EM | Admit: 2024-05-10 | Discharge: 2024-05-10 | Disposition: A | Discharge status: Home / Self Care | Attending: Emergency Medicine | Admitting: Emergency Medicine

## 2024-05-10 ENCOUNTER — Other Ambulatory Visit: Payer: Self-pay

## 2024-05-10 DIAGNOSIS — Z992 Dependence on renal dialysis: Secondary | ICD-10-CM | POA: Insufficient documentation

## 2024-05-10 DIAGNOSIS — Z5329 Procedure and treatment not carried out because of patient's decision for other reasons: Secondary | ICD-10-CM | POA: Diagnosis not present

## 2024-05-10 DIAGNOSIS — N186 End stage renal disease: Secondary | ICD-10-CM | POA: Insufficient documentation

## 2024-05-10 MED ORDER — CHLORHEXIDINE GLUCONATE CLOTH 2 % EX PADS: 6.0000 | MEDICATED_PAD | Freq: Every day | CUTANEOUS | Status: DC

## 2024-05-10 NOTE — ED Provider Notes (Addendum)
" °  [place] EMERGENCY DEPARTMENT AT [facility] Provider Note   CSN: [id number] Arrival date & time: 05/10/24  9366     Patient presents with: Vascular Access Problem   Paul [name] is a 59 y.o. male.   HPI 59 year old male here for chronic dialysis.  He denies any acute complaints.  Prior to Admission medications  Medication Sig Start Date End Date Taking? Authorizing Provider  ARIPiprazole  ER (ABILIFY  MAINTENA) 400 MG PRSY prefilled syringe Inject 400 mg into the muscle every 28 (twenty-eight) days. 03/24/24   Plovsky, Elna, MD  carvedilol  (COREG ) 6.25 MG tablet Take 1 tablet (6.25 mg total) by mouth 2 (two) times daily with a meal. 01/13/24   Delbert Clam, MD  isosorbide  mononitrate (IMDUR ) 30 MG 24 hr tablet Take 1 tablet (30 mg total) by mouth daily. 04/28/24   Placey, Ronal Caldron, NP  oseltamivir  (TAMIFLU ) 30 MG capsule Take 1 capsule by mouth after your next two dialysis sessions Patient not taking: Reported on 04/28/2024 04/21/24   Randol Simmonds, MD  OVER THE COUNTER MEDICATION Take 1 capsule by mouth daily. Lion's Mane for memory    [provider]  rosuvastatin  (CRESTOR ) 5 MG tablet Take 1 tablet (5 mg total) by mouth daily. 03/24/23     sucroferric oxyhydroxide (VELPHORO ) 500 MG chewable tablet Chew 3 tablets (1,500 mg total) by mouth 3 (three) times daily with meals. Patient not taking: Reported on 04/28/2024 07/29/23   Tawkaliyar, Roya, DO    Allergies: Penicillin g    Review of Systems  Updated Vital Signs BP 139/88   Pulse 70   Temp 98 F (36.7 C)   Resp 18   Ht 5' 3 (1.6 m)   Wt 84 kg   SpO2 100%   BMI 32.80 kg/m   Physical Exam Vitals and nursing note reviewed.  Constitutional:      Appearance: He is well-developed.  HENT:     Head: Normocephalic and atraumatic.  Cardiovascular:     Rate and Rhythm: Normal rate.  Pulmonary:     Effort: Pulmonary effort is normal.  Abdominal:     General: There is no distension.  Skin:     General: Skin is warm and dry.  Neurological:     Mental Status: He is alert.     (all labs ordered are listed, but only abnormal results are displayed) Labs Reviewed - No data to display  EKG: None  Radiology: No results found.   Procedures   Medications Ordered in the ED  Chlorhexidine  Gluconate Cloth 2 % PADS 6 each (has no administration in time range)                                    Medical Decision Making Risk Decision regarding hospitalization.   I spoke with Dr. Geralynn, who will dialyze. He is otherwise well appearing, has normal vitals. Stable for dialysis.  Addendum: I was informed by the nurse that the patient eloped due to the wait.  Final diagnoses:  ESRD (end stage renal disease) [facility])    ED Discharge Orders     None          Freddi Hamilton, MD 05/10/24 1038    Freddi Hamilton, MD 05/10/24 1237  "

## 2024-05-10 NOTE — ED Notes (Signed)
 Dr. Geralynn was at bedside and will put in orders.

## 2024-05-10 NOTE — ED Notes (Signed)
 Pt called dialysis unit and was told that he would likely not be up there until tonight or tomorrow morning so he elected to leave.

## 2024-05-10 NOTE — ED Triage Notes (Signed)
No complaints; here for dialysis.

## 2024-05-11 NOTE — Progress Notes (Signed)
 Late Note Entry- May 11, 2024  Contacted local [facility] yesterday to inquire if they are able to accept pt's insuranc. Navigator advised that clinic cannot accept pt's insurance at this time. Referral will have to be submitted once clinic is able to accept pt's insurance. Will assist as needed.   Randine Mungo Dialysis Navigator [phone]

## 2024-05-12 ENCOUNTER — Encounter (HOSPITAL_COMMUNITY): Payer: Self-pay

## 2024-05-12 ENCOUNTER — Emergency Department (HOSPITAL_COMMUNITY)
Admission: EM | Admit: 2024-05-12 | Discharge: 2024-05-14 | Disposition: A | Discharge status: Home / Self Care | Attending: Nephrology | Admitting: Nephrology

## 2024-05-12 ENCOUNTER — Other Ambulatory Visit: Payer: Self-pay

## 2024-05-12 DIAGNOSIS — Z992 Dependence on renal dialysis: Secondary | ICD-10-CM | POA: Diagnosis present

## 2024-05-12 DIAGNOSIS — Z5321 Procedure and treatment not carried out due to patient leaving prior to being seen by health care provider: Secondary | ICD-10-CM | POA: Diagnosis not present

## 2024-05-12 DIAGNOSIS — I120 Hypertensive chronic kidney disease with stage 5 chronic kidney disease or end stage renal disease: Secondary | ICD-10-CM | POA: Diagnosis not present

## 2024-05-12 DIAGNOSIS — Z79899 Other long term (current) drug therapy: Secondary | ICD-10-CM | POA: Diagnosis not present

## 2024-05-12 DIAGNOSIS — N186 End stage renal disease: Secondary | ICD-10-CM | POA: Diagnosis not present

## 2024-05-12 MED ORDER — CHLORHEXIDINE GLUCONATE CLOTH 2 % EX PADS: 6.0000 | MEDICATED_PAD | Freq: Every day | CUTANEOUS | Status: DC

## 2024-05-12 MED ORDER — HEPARIN SODIUM (PORCINE) 1000 UNIT/ML IJ SOLN
INTRAMUSCULAR | Status: AC
  Filled 2024-05-12: qty 4

## 2024-05-12 NOTE — Procedures (Signed)
 Asked to see this patient for hospital dialysis. Pt was discharged from his outpatient unit for behavior issues. The plan will be for ED HD. Pt is not to be admitted at this time. Pt will go to the dialysis unit when they are ready for the patient. When dialysis is completed pt will be sent back to ED for reassessment.   Vitals:   05/12/24 0630 05/12/24 1018  BP: 133/78 (!) 147/91  Pulse: 64 68  Resp: 19 17  Temp: (!) 97.5 F (36.4 C) 97.7 F (36.5 C)  SpO2: 100% 94%       I was present at the procedure, reviewed the HD regimen and made appropriate changes.   Myer Fret MD  [facility] 05/12/2024, 12:40 PM    No results for input(s): HGB, ALBUMIN, CALCIUM , PHOS, CREATININE, K in the last 168 hours. No results for input(s): IRON , TIBC, FERRITIN in the last 168 hours. Inpatient medications:  ARIPiprazole  ER  400 mg Intramuscular Q28 days   Chlorhexidine  Gluconate Cloth  6 each Topical Q0600

## 2024-05-12 NOTE — ED Triage Notes (Signed)
 Patient states he needs dialysis this morning.

## 2024-05-13 ENCOUNTER — Ambulatory Visit: Payer: Self-pay | Admitting: Pharmacist

## 2024-05-13 ENCOUNTER — Other Ambulatory Visit: Payer: Self-pay

## 2024-05-13 NOTE — Progress Notes (Signed)
 Specialty Pharmacy Refill Coordination Note  Paul [name] is a 59 y.o. male contacted today regarding refills of specialty medication(s) ARIPiprazole  (ABILIFY  MAINTENA)   Patient requested Courier to Provider Office   Delivery date: 05/17/24   Verified address: [facility] [place]   Medication will be filled on: 05/14/24  Copay: $4.90, patient aware and provided payment information.   Appointment: 1.15.26

## 2024-05-14 ENCOUNTER — Other Ambulatory Visit: Payer: Self-pay

## 2024-05-14 ENCOUNTER — Encounter (HOSPITAL_COMMUNITY): Payer: Self-pay | Admitting: Emergency Medicine

## 2024-05-14 ENCOUNTER — Emergency Department (HOSPITAL_COMMUNITY)
Admission: EM | Admit: 2024-05-14 | Discharge: 2024-05-14 | Disposition: A | Discharge status: Home / Self Care | Attending: Emergency Medicine | Admitting: Emergency Medicine

## 2024-05-14 DIAGNOSIS — Z992 Dependence on renal dialysis: Secondary | ICD-10-CM | POA: Diagnosis not present

## 2024-05-14 DIAGNOSIS — Z79899 Other long term (current) drug therapy: Secondary | ICD-10-CM | POA: Insufficient documentation

## 2024-05-14 DIAGNOSIS — N186 End stage renal disease: Secondary | ICD-10-CM | POA: Insufficient documentation

## 2024-05-14 DIAGNOSIS — I120 Hypertensive chronic kidney disease with stage 5 chronic kidney disease or end stage renal disease: Secondary | ICD-10-CM | POA: Insufficient documentation

## 2024-05-14 LAB — CBC WITH DIFFERENTIAL/PLATELET
Abs Immature Granulocytes: 0.02 K/uL (ref 0.00–0.07)
Basophils Absolute: 0.1 K/uL (ref 0.0–0.1)
Basophils Relative: 1 %
Eosinophils Absolute: 0.3 K/uL (ref 0.0–0.5)
Eosinophils Relative: 5 %
HCT: 29 % — ABNORMAL LOW (ref 39.0–52.0)
Hemoglobin: 9.5 g/dL — ABNORMAL LOW (ref 13.0–17.0)
Immature Granulocytes: 0 %
Lymphocytes Relative: 20 %
Lymphs Abs: 1.3 K/uL (ref 0.7–4.0)
MCH: 30.2 pg (ref 26.0–34.0)
MCHC: 32.8 g/dL (ref 30.0–36.0)
MCV: 92.1 fL (ref 80.0–100.0)
Monocytes Absolute: 0.6 K/uL (ref 0.1–1.0)
Monocytes Relative: 10 %
Neutro Abs: 4 K/uL (ref 1.7–7.7)
Neutrophils Relative %: 64 %
Platelets: 238 K/uL (ref 150–400)
RBC: 3.15 MIL/uL — ABNORMAL LOW (ref 4.22–5.81)
RDW: 18.6 % — ABNORMAL HIGH (ref 11.5–15.5)
WBC: 6.3 K/uL (ref 4.0–10.5)
nRBC: 0 % (ref 0.0–0.2)

## 2024-05-14 LAB — RENAL FUNCTION PANEL
Albumin: 4.1 g/dL (ref 3.5–5.0)
Anion gap: 22 — ABNORMAL HIGH (ref 5–15)
BUN: 72 mg/dL — ABNORMAL HIGH (ref 6–20)
CO2: 21 mmol/L — ABNORMAL LOW (ref 22–32)
Calcium: 7.1 mg/dL — ABNORMAL LOW (ref 8.9–10.3)
Chloride: 96 mmol/L — ABNORMAL LOW (ref 98–111)
Creatinine, Ser: 12.1 mg/dL — ABNORMAL HIGH (ref 0.61–1.24)
GFR, Estimated: 4 mL/min — ABNORMAL LOW
Glucose, Bld: 107 mg/dL — ABNORMAL HIGH (ref 70–99)
Phosphorus: 12.7 mg/dL — ABNORMAL HIGH (ref 2.5–4.6)
Potassium: 4.9 mmol/L (ref 3.5–5.1)
Sodium: 139 mmol/L (ref 135–145)

## 2024-05-14 MED ORDER — HEPARIN SODIUM (PORCINE) 1000 UNIT/ML DIALYSIS: 1000.0000 [IU] | INTRAMUSCULAR | Status: DC | PRN

## 2024-05-14 MED ORDER — ALTEPLASE 2 MG IJ SOLR: 2.0000 mg | Freq: Once | INTRAMUSCULAR | Status: DC | PRN

## 2024-05-14 MED ORDER — LIDOCAINE-PRILOCAINE 2.5-2.5 % EX CREA: 1.0000 | TOPICAL_CREAM | CUTANEOUS | Status: DC | PRN

## 2024-05-14 MED ORDER — HEPARIN SODIUM (PORCINE) 1000 UNIT/ML IJ SOLN
INTRAMUSCULAR | Status: AC
  Filled 2024-05-14: qty 3

## 2024-05-14 MED ORDER — ANTICOAGULANT SODIUM CITRATE 4% (200MG/5ML) IV SOLN: 5.0000 mL | Status: DC | PRN

## 2024-05-14 MED ORDER — PENTAFLUOROPROP-TETRAFLUOROETH EX AERO: 1.0000 | INHALATION_SPRAY | CUTANEOUS | Status: DC | PRN

## 2024-05-14 MED ORDER — CHLORHEXIDINE GLUCONATE CLOTH 2 % EX PADS: 6.0000 | MEDICATED_PAD | Freq: Every day | CUTANEOUS | Status: DC

## 2024-05-14 MED ORDER — LIDOCAINE HCL (PF) 1 % IJ SOLN: 5.0000 mL | INTRAMUSCULAR | Status: DC | PRN

## 2024-05-14 MED ORDER — HEPARIN SODIUM (PORCINE) 1000 UNIT/ML DIALYSIS
2500.0000 [IU] | Freq: Once | INTRAMUSCULAR | Status: AC
  Administered 2024-05-14: 2500 [IU] via INTRAVENOUS_CENTRAL
  Filled 2024-05-14: qty 3

## 2024-05-14 NOTE — ED Provider Notes (Signed)
 Patient left the ED before I was able to reassess him after his dialysis.  Patient reportedly felt well   Randol Simmonds, MD 05/14/24 1819

## 2024-05-14 NOTE — Progress Notes (Signed)
" °   05/14/24 1720  Vitals  Temp 97.8 F (36.6 C)  Pulse Rate 77  Resp 13  BP (!) 146/93  SpO2 97 %  O2 Device Room Air  Weight 82.5 kg  Type of Weight Post-Dialysis  Oxygen Therapy  Patient Activity (if Appropriate) In bed  Pulse Oximetry Type Continuous  Oximetry Probe Site Changed No  Post Treatment  Dialyzer Clearance Lightly streaked  Hemodialysis Intake (mL) 0 mL  Liters Processed 84  Fluid Removed (mL) 1500 mL  Tolerated HD Treatment Yes  AVG/AVF Arterial Site Held (minutes) 10 minutes  AVG/AVF Venous Site Held (minutes) 10 minutes   Received patient in bed to unit.  Alert and oriented.  Informed consent signed and in chart.   TX duration:3.5  Patient tolerated well.  Transported back to the room  Alert, without acute distress.  Hand-off given to patient's nurse.   Access used: RUAG Access issues: no complications  Total UF removed: 1500 Medication(s) given: none    Paul [name] "

## 2024-05-14 NOTE — ED Provider Notes (Signed)
 " [place] EMERGENCY DEPARTMENT AT [facility] Provider Note   CSN: [id number] Arrival date & time: 05/14/24  9348     Patient presents with: Vascular Access Problem   Paul [name] is a 59 y.o. male.  Patient is presenting to ED for routine dialysis. Gets dialysis MWF. Vitals stable, mild hypertension, no acute complaints.    HPI     Prior to Admission medications  Medication Sig Start Date End Date Taking? Authorizing Provider  ARIPiprazole  ER (ABILIFY  MAINTENA) 400 MG PRSY prefilled syringe Inject 400 mg into the muscle every 28 (twenty-eight) days. 03/24/24   Plovsky, Elna, MD  carvedilol  (COREG ) 6.25 MG tablet Take 1 tablet (6.25 mg total) by mouth 2 (two) times daily with a meal. 01/13/24   Delbert Clam, MD  isosorbide  mononitrate (IMDUR ) 30 MG 24 hr tablet Take 1 tablet (30 mg total) by mouth daily. 04/28/24   Placey, Ronal Caldron, NP  oseltamivir  (TAMIFLU ) 30 MG capsule Take 1 capsule by mouth after your next two dialysis sessions Patient not taking: Reported on 04/28/2024 04/21/24   Randol Simmonds, MD  OVER THE COUNTER MEDICATION Take 1 capsule by mouth daily. Lion's Mane for memory    [provider]  rosuvastatin  (CRESTOR ) 5 MG tablet Take 1 tablet (5 mg total) by mouth daily. 03/24/23     sucroferric oxyhydroxide (VELPHORO ) 500 MG chewable tablet Chew 3 tablets (1,500 mg total) by mouth 3 (three) times daily with meals. Patient not taking: Reported on 04/28/2024 07/29/23   Tawkaliyar, Roya, DO    Allergies: Penicillin g    Review of Systems  Constitutional:  Positive for activity change.    Updated Vital Signs BP (!) 158/91 (BP Location: Right Arm)   Pulse 83   Temp (!) 97.5 F (36.4 C)   Resp 18   Ht 5' 3 (1.6 m)   Wt 84 kg   SpO2 98%   BMI 32.80 kg/m   Physical Exam Vitals and nursing note reviewed.  Constitutional:      General: He is not in acute distress.    Appearance: He is not toxic-appearing.  HENT:     Head: Normocephalic  and atraumatic.  Eyes:     General: No scleral icterus.    Conjunctiva/sclera: Conjunctivae normal.  Cardiovascular:     Rate and Rhythm: Normal rate and regular rhythm.     Pulses: Normal pulses.     Heart sounds: Normal heart sounds.  Pulmonary:     Effort: Pulmonary effort is normal. No respiratory distress.     Breath sounds: Normal breath sounds.  Abdominal:     General: Abdomen is flat. Bowel sounds are normal.     Palpations: Abdomen is soft.     Tenderness: There is no abdominal tenderness.  Skin:    General: Skin is warm and dry.     Findings: No lesion.  Neurological:     General: No focal deficit present.     Mental Status: He is alert and oriented to person, place, and time. Mental status is at baseline.     (all labs ordered are listed, but only abnormal results are displayed) Labs Reviewed - No data to display  EKG: None  Radiology: No results found.   Procedures   Medications Ordered in the ED - No data to display  Medical Decision Making  This patient presents to the ED for concern of ESRD, this involves an extensive number of treatment options, and is a complaint that carries with it a high risk of complications and morbidity.  The differential diagnosis includes ESRD    Problem List / ED Course / Critical interventions / Medication management  Patient presenting to ED for dialysis, he is well known to this department.  He has end-stage renal disease and comes to the emergency room for his routine dialysis treatment.  His v/s are stable. He denies acute complaint.  I have reached out to Dr Geralynn with nephrology for dialysis. The plan is for the patient to receive dialysis today, anticipate likely stable for discharge after.         Final diagnoses:  ESRD (end stage renal disease) [facility])    ED Discharge Orders     None          Shermon Warren SAILOR, PA-C 05/14/24 0845    Francesca Elsie CROME,  MD 05/14/24 1403  "

## 2024-05-14 NOTE — ED Triage Notes (Signed)
 Pt states he need dialysis this morning (M,W,F).  No other issues at this time

## 2024-05-17 ENCOUNTER — Other Ambulatory Visit: Payer: Self-pay

## 2024-05-17 ENCOUNTER — Encounter (HOSPITAL_COMMUNITY): Payer: Self-pay

## 2024-05-17 ENCOUNTER — Emergency Department (HOSPITAL_COMMUNITY)
Admission: EM | Admit: 2024-05-17 | Discharge: 2024-05-18 | Attending: Emergency Medicine | Admitting: Emergency Medicine

## 2024-05-17 DIAGNOSIS — Z992 Dependence on renal dialysis: Secondary | ICD-10-CM | POA: Diagnosis not present

## 2024-05-17 DIAGNOSIS — Z87891 Personal history of nicotine dependence: Secondary | ICD-10-CM | POA: Diagnosis not present

## 2024-05-17 DIAGNOSIS — I509 Heart failure, unspecified: Secondary | ICD-10-CM | POA: Insufficient documentation

## 2024-05-17 DIAGNOSIS — N186 End stage renal disease: Secondary | ICD-10-CM | POA: Insufficient documentation

## 2024-05-17 MED ORDER — LIDOCAINE HCL (PF) 1 % IJ SOLN: 5.0000 mL | INTRAMUSCULAR | Status: DC | PRN

## 2024-05-17 MED ORDER — HEPARIN SODIUM (PORCINE) 1000 UNIT/ML DIALYSIS: 1000.0000 [IU] | INTRAMUSCULAR | Status: DC | PRN

## 2024-05-17 MED ORDER — PENTAFLUOROPROP-TETRAFLUOROETH EX AERO: 1.0000 | INHALATION_SPRAY | CUTANEOUS | Status: DC | PRN

## 2024-05-17 MED ORDER — LIDOCAINE-PRILOCAINE 2.5-2.5 % EX CREA: 1.0000 | TOPICAL_CREAM | CUTANEOUS | Status: DC | PRN

## 2024-05-17 MED ORDER — ALTEPLASE 2 MG IJ SOLR: 2.0000 mg | Freq: Once | INTRAMUSCULAR | Status: DC | PRN

## 2024-05-17 MED ORDER — CHLORHEXIDINE GLUCONATE CLOTH 2 % EX PADS: 6.0000 | MEDICATED_PAD | Freq: Every day | CUTANEOUS | Status: DC

## 2024-05-17 MED ORDER — ANTICOAGULANT SODIUM CITRATE 4% (200MG/5ML) IV SOLN: 5.0000 mL | Status: DC | PRN

## 2024-05-17 MED ORDER — HEPARIN SODIUM (PORCINE) 1000 UNIT/ML DIALYSIS
2400.0000 [IU] | Freq: Once | INTRAMUSCULAR | Status: AC
  Administered 2024-05-17: 2400 [IU] via INTRAVENOUS_CENTRAL
  Filled 2024-05-17: qty 3

## 2024-05-17 MED ORDER — HEPARIN SODIUM (PORCINE) 1000 UNIT/ML IJ SOLN
INTRAMUSCULAR | Status: AC
  Filled 2024-05-17: qty 3

## 2024-05-17 MED ORDER — HEPARIN SODIUM (PORCINE) 1000 UNIT/ML DIALYSIS
2500.0000 [IU] | Freq: Once | INTRAMUSCULAR | Status: DC
  Filled 2024-05-17: qty 3

## 2024-05-17 NOTE — Progress Notes (Signed)
 Pt. Came in awake and oriented with no complaints Consent on verified and on file  Started with no complaints  UF removal: Tx duration: 3.5 Hours  Access used: Left AVG Access issue: None  Tx completed and tolerated Pressure dressing applied. Endorsed to floor nurse Transported back to ED  Estanislao Morley Jovonte Commins,RN Kidney Care Unit

## 2024-05-17 NOTE — ED Notes (Signed)
 Patient stated that he took his blood pressure meds this morning but concerned of the BP

## 2024-05-17 NOTE — Discharge Instructions (Signed)
 It was a pleasure caring for you today in the emergency department.  Please return to the emergency department for any worsening or worrisome symptoms.    RESOURCE GUIDE  Chronic Pain Problems: Contact [facility]  [phone] Patients need to be referred by their primary care doctor.  Insufficient Money for Medicine: Contact [employer]:  call [phone]  No Primary Care Doctor: Call [facility]  [phone] - can help you locate a primary care doctor that  accepts your insurance, provides certain services, etc. Physician Referral Service- [phone]  Agencies that provide inexpensive medical care: [facility]  [phone] [facility]  [phone] [facility]  [phone] [facility]  [phone] [facility]  [phone] [facility]  [phone]  Medicaid-accepting [facility] Providers: [facility]- [address]  [phone], Mon-Fri 9am-7pm, Sat 9am-1pm [facility]- [address] [place], [address] [place]  [phone] [facility]- [address] [place]  [phone] [facility]- [address]  [phone] Jonathon Neighbors- [address] [place], [address], [phone]  Only accepts [place] patients after they have their name  applied to their card  Self Pay (no insurance) in [facility]: Sickle Cell Patients - [facility] Internal Medicine  [address] [place], [phone] [facility]- [address] [place]  [phone]       [facility] [place]- [address]       -     [facility]- see information above (Speak to [employer] if you do not have insurance)       -  [facility]- [address],  [phone]       -  [facility]- [address] [phone]       -  Dr Sherlene Diss-  [address], [place], [phone]       -   [facility]gent [facility] and [facility] - [address] [phone]       -  [facility]- [address], [phone], also [address] [phone]       -     [facility]- [address] [place], [phone], 1st & 3rd Saturday         every month, 10am-1pm  -     [facility] and [facility]   [address] [place], [place].   Phone:  [phone], Fax:  [phone]. Hours of Operation:  9 am - 6 pm, M-F.  -     [facility]   [address], [place]   Phone: [phone], Fax: [phone]. Hours of Operation:  8:30 am - 5:30 pm, M-F.    Dental Assistance If unable to pay or uninsured, contact:  [facility]. to become qualified for the adult dental clinic.  Patients with Medicaid: [facility] [phone] [address], [phone] [address] [phone]  If unable to pay, or uninsured, contact [facility] [phone] in [place], [phone] in [facility]) to become qualified for the adult dental clinic  [facility] [address] [place], [place] [address] [phone] [email or web address]  Other Proofreader Services: [facility]- [address] [place], [place], [address], [phone], Ext. [phone], 2nd  and 4th Thursday of the month at 6:30am.  10 clients each day by appointment, can sometimes see walk-in patients if someone does not show for an appointment. [facility]- [address] [place], [place], [address], [phone] [facility] [address] [place], [place], [address], [phone] [facility] Health Department- [phone] [facility] Health Department- [phone] [facility] [phone]

## 2024-05-17 NOTE — ED Triage Notes (Signed)
 Pt came in via POV requesting dialysis, is a M/W/F schedule, has not missed a Tx. Denies any CP does have some wheezing he reports, no difficulty breathing.

## 2024-05-17 NOTE — ED Notes (Signed)
 Patient transported to dialysis.

## 2024-05-17 NOTE — Progress Notes (Signed)
" °   05/17/24 1719  Vitals  Temp 97.6 F (36.4 C)  Pulse Rate 72  Resp 13  BP 133/78  SpO2 100 %  O2 Device Room Air  Weight 87 kg  Type of Weight Post-Dialysis  Oxygen Therapy  Patient Activity (if Appropriate) In bed  Pulse Oximetry Type Continuous  Post Treatment  Dialyzer Clearance Clear  Liters Processed 84  Fluid Removed (mL) 2000 mL  Tolerated HD Treatment Yes  AVG/AVF Arterial Site Held (minutes) 6 minutes  AVG/AVF Venous Site Held (minutes) 6 minutes    "

## 2024-05-17 NOTE — Progress Notes (Signed)
 Navigator contacted local [facility] last week to inquire if they are accepting pt's insurance at this time. Navigator advised that clinic cannot accept pt's insurance. Will continue to f/u with clinic as to if/when they can accept pt's insurance so a referral can be made. Spoke to pt via phone to make pt aware of the above info. Pt voiced understanding. Will assist as able.   Randine Mungo  Dialysis Navigator [phone]
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
## 2024-05-18 ENCOUNTER — Ambulatory Visit (HOSPITAL_COMMUNITY): Admitting: Psychiatry

## 2024-05-19 ENCOUNTER — Encounter (HOSPITAL_COMMUNITY): Payer: Self-pay | Admitting: Emergency Medicine

## 2024-05-19 ENCOUNTER — Other Ambulatory Visit: Payer: Self-pay

## 2024-05-19 ENCOUNTER — Emergency Department (HOSPITAL_COMMUNITY)
Admission: EM | Admit: 2024-05-19 | Discharge: 2024-05-19 | Disposition: A | Discharge status: Home / Self Care | Attending: Emergency Medicine | Admitting: Emergency Medicine

## 2024-05-19 DIAGNOSIS — I132 Hypertensive heart and chronic kidney disease with heart failure and with stage 5 chronic kidney disease, or end stage renal disease: Secondary | ICD-10-CM | POA: Diagnosis not present

## 2024-05-19 DIAGNOSIS — N186 End stage renal disease: Secondary | ICD-10-CM | POA: Insufficient documentation

## 2024-05-19 DIAGNOSIS — R062 Wheezing: Secondary | ICD-10-CM | POA: Diagnosis present

## 2024-05-19 DIAGNOSIS — I509 Heart failure, unspecified: Secondary | ICD-10-CM | POA: Insufficient documentation

## 2024-05-19 DIAGNOSIS — Z992 Dependence on renal dialysis: Secondary | ICD-10-CM | POA: Insufficient documentation

## 2024-05-19 DIAGNOSIS — R600 Localized edema: Secondary | ICD-10-CM | POA: Diagnosis not present

## 2024-05-19 LAB — COMPREHENSIVE METABOLIC PANEL WITH GFR
ALT: 17 U/L (ref 0–44)
AST: 15 U/L (ref 15–41)
Albumin: 4.3 g/dL (ref 3.5–5.0)
Alkaline Phosphatase: 73 U/L (ref 38–126)
Anion gap: 19 — ABNORMAL HIGH (ref 5–15)
BUN: 56 mg/dL — ABNORMAL HIGH (ref 6–20)
CO2: 23 mmol/L (ref 22–32)
Calcium: 7.6 mg/dL — ABNORMAL LOW (ref 8.9–10.3)
Chloride: 95 mmol/L — ABNORMAL LOW (ref 98–111)
Creatinine, Ser: 9.9 mg/dL — ABNORMAL HIGH (ref 0.61–1.24)
GFR, Estimated: 6 mL/min — ABNORMAL LOW
Glucose, Bld: 81 mg/dL (ref 70–99)
Potassium: 4.6 mmol/L (ref 3.5–5.1)
Sodium: 137 mmol/L (ref 135–145)
Total Bilirubin: 0.8 mg/dL (ref 0.0–1.2)
Total Protein: 7.7 g/dL (ref 6.5–8.1)

## 2024-05-19 LAB — CBC WITH DIFFERENTIAL/PLATELET
Abs Immature Granulocytes: 0.03 K/uL (ref 0.00–0.07)
Basophils Absolute: 0 K/uL (ref 0.0–0.1)
Basophils Relative: 1 %
Eosinophils Absolute: 0.2 K/uL (ref 0.0–0.5)
Eosinophils Relative: 3 %
HCT: 29.2 % — ABNORMAL LOW (ref 39.0–52.0)
Hemoglobin: 9.6 g/dL — ABNORMAL LOW (ref 13.0–17.0)
Immature Granulocytes: 1 %
Lymphocytes Relative: 19 %
Lymphs Abs: 1.2 K/uL (ref 0.7–4.0)
MCH: 30.4 pg (ref 26.0–34.0)
MCHC: 32.9 g/dL (ref 30.0–36.0)
MCV: 92.4 fL (ref 80.0–100.0)
Monocytes Absolute: 0.7 K/uL (ref 0.1–1.0)
Monocytes Relative: 11 %
Neutro Abs: 4.3 K/uL (ref 1.7–7.7)
Neutrophils Relative %: 65 %
Platelets: 214 K/uL (ref 150–400)
RBC: 3.16 MIL/uL — ABNORMAL LOW (ref 4.22–5.81)
RDW: 18.1 % — ABNORMAL HIGH (ref 11.5–15.5)
WBC: 6.5 K/uL (ref 4.0–10.5)
nRBC: 0 % (ref 0.0–0.2)

## 2024-05-19 MED ORDER — ANTICOAGULANT SODIUM CITRATE 4% (200MG/5ML) IV SOLN: 5.0000 mL | Status: DC | PRN

## 2024-05-19 MED ORDER — PENTAFLUOROPROP-TETRAFLUOROETH EX AERO: 1.0000 | INHALATION_SPRAY | CUTANEOUS | Status: DC | PRN

## 2024-05-19 MED ORDER — CHLORHEXIDINE GLUCONATE CLOTH 2 % EX PADS: 6.0000 | MEDICATED_PAD | Freq: Every day | CUTANEOUS | Status: DC

## 2024-05-19 MED ORDER — HEPARIN SODIUM (PORCINE) 1000 UNIT/ML DIALYSIS: 1000.0000 [IU] | INTRAMUSCULAR | Status: DC | PRN

## 2024-05-19 MED ORDER — LIDOCAINE-PRILOCAINE 2.5-2.5 % EX CREA: 1.0000 | TOPICAL_CREAM | CUTANEOUS | Status: DC | PRN

## 2024-05-19 MED ORDER — ALTEPLASE 2 MG IJ SOLR: 2.0000 mg | Freq: Once | INTRAMUSCULAR | Status: DC | PRN

## 2024-05-19 MED ORDER — LIDOCAINE HCL (PF) 1 % IJ SOLN: 5.0000 mL | INTRAMUSCULAR | Status: DC | PRN

## 2024-05-19 NOTE — ED Provider Notes (Signed)
 " [place] EMERGENCY DEPARTMENT AT [facility] Provider Note   CSN: [id number] Arrival date & time: 05/19/24  9365     Patient presents with: Vascular Access Problem   Paul [name] is a 59 y.o. male.   HPI       59 year old male with history of schizophrenia, hypertension, CHF, bipolar disorder, central retinal artery occlusion, ESRD who has been receiving dialysis through the emergency department who presents for his regular dialysis.  Denies any significant other concerns. Reports occasional wheezing over the past few days, mild increased sock lines/edema.  Denies significant dyspnea, denies cough/chest pain/fever. No n/v other concerns. Sees Dr. Vicci as PCP.   Past Medical History:  Diagnosis Date   Bipolar disorder (HCC)    Central retinal artery occlusion    with macular infarction of the right eye. status post posterior vitrectomy and photocoagulation with insertion of a shunt in 2006.   CHF (congestive heart failure) (HCC)    chronic mixed syst/diast. 02/2009 echo: Systolic function was mildly reduced. The estimated ejection fraction was in 45%, global HK, Grade I Diast dysfxn.   Chronic kidney disease    ESRD   Heart failure    Obesity    Osteoarthritis    s/p right hip replacement in 2001   Schizophrenia (HCC)    Severe uncontrolled hypertension      Prior to Admission medications  Medication Sig Start Date End Date Taking? Authorizing Provider  ARIPiprazole  ER (ABILIFY  MAINTENA) 400 MG PRSY prefilled syringe Inject 400 mg into the muscle every 28 (twenty-eight) days. 03/24/24   Plovsky, Elna, MD  carvedilol  (COREG ) 6.25 MG tablet Take 1 tablet (6.25 mg total) by mouth 2 (two) times daily with a meal. 01/13/24   Delbert Clam, MD  isosorbide  mononitrate (IMDUR ) 30 MG 24 hr tablet Take 1 tablet (30 mg total) by mouth daily. 04/28/24   Placey, Ronal Caldron, NP  oseltamivir  (TAMIFLU ) 30 MG capsule Take 1 capsule by mouth after your next two dialysis  sessions Patient not taking: Reported on 04/28/2024 04/21/24   Randol Simmonds, MD  OVER THE COUNTER MEDICATION Take 1 capsule by mouth daily. [employer]ane for memory    [provider]  rosuvastatin  (CRESTOR ) 5 MG tablet Take 1 tablet (5 mg total) by mouth daily. 03/24/23     sucroferric oxyhydroxide (VELPHORO ) 500 MG chewable tablet Chew 3 tablets (1,500 mg total) by mouth 3 (three) times daily with meals. Patient not taking: Reported on 04/28/2024 07/29/23   Tawkaliyar, Roya, DO    Allergies: Penicillin g    Review of Systems  Updated Vital Signs BP (!) 158/100   Pulse 77   Temp 97.6 F (36.4 C)   Resp 16   SpO2 100%   Physical Exam Vitals and nursing note reviewed.  Constitutional:      General: He is not in acute distress.    Appearance: He is well-developed. He is not diaphoretic.  HENT:     Head: Normocephalic and atraumatic.  Eyes:     Conjunctiva/sclera: Conjunctivae normal.  Cardiovascular:     Rate and Rhythm: Normal rate and regular rhythm.     Heart sounds: Normal heart sounds. No murmur heard.    No friction rub. No gallop.  Pulmonary:     Effort: Pulmonary effort is normal. No respiratory distress.     Breath sounds: Normal breath sounds. No wheezing or rales.  Abdominal:     General: There is no distension.     Palpations:  Abdomen is soft.     Tenderness: There is no abdominal tenderness. There is no guarding.  Musculoskeletal:     Cervical back: Normal range of motion.     Right lower leg: Right lower leg edema: mild.     Left lower leg: Left lower leg edema: mild.  Skin:    General: Skin is warm and dry.  Neurological:     Mental Status: He is alert and oriented to person, place, and time.     (all labs ordered are listed, but only abnormal results are displayed) Labs Reviewed  CBC WITH DIFFERENTIAL/PLATELET - Abnormal; Notable for the following components:      Result Value   RBC 3.16 (*)    Hemoglobin 9.6 (*)    HCT 29.2 (*)    RDW 18.1  (*)    All other components within normal limits  COMPREHENSIVE METABOLIC PANEL WITH GFR - Abnormal; Notable for the following components:   Chloride 95 (*)    BUN 56 (*)    Creatinine, Ser 9.90 (*)    Calcium  7.6 (*)    GFR, Estimated 6 (*)    Anion gap 19 (*)    All other components within normal limits    EKG: None  Radiology: No results found.   Procedures   Medications Ordered in the ED - No data to display                                  Medical Decision Making Amount and/or Complexity of Data Reviewed Labs: ordered.    59 year old male with history of schizophrenia, hypertension, CHF, bipolar disorder, central retinal artery occlusion, ESRD who has been receiving dialysis through the emergency department who presents for his regular dialysis.  Labs completed show stable anemia, no leukocytosis, no hyperkalemia. \  Denies any significant symptoms, does report lateral lower extremity edema and occasional wheezing but does not have any dyspnea at this time, no chest pain, no fever, and have low suspicion for ACS, PE, aortic dissection, acute CHF exacerbation by his current history and exam.  Will continue with plan for dialysis.  The team monitoring of his symptoms and return if he develops cp/dyspnea/other concerns.   Discussed with Nephrology team who is aware of his dialysis needs today.      Final diagnoses:  ESRD (end stage renal disease) [facility])    ED Discharge Orders     None          Dreama Longs, MD 05/19/24 [phone]  "

## 2024-05-19 NOTE — ED Notes (Signed)
 Pt arrived back from dialysis and then ambulated out of the ED independently not waiting on VS or DC papers.

## 2024-05-19 NOTE — ED Triage Notes (Signed)
 Pt arrive POV requesting HD, is a MWF schedule HD treatment last treatment 05/17/24. Pt denies any CP or SOB.

## 2024-05-20 ENCOUNTER — Ambulatory Visit (HOSPITAL_COMMUNITY)

## 2024-05-20 NOTE — Progress Notes (Signed)
 Received patient in bed to unit.  Alert and oriented.  Informed consent signed and in chart.   TX duration:  Patient tolerated well.  Transported back to the room  Alert, without acute distress.  Hand-off given to patient's nurse.   Access used: AVF Access issues: Staff had problems with the site bleeding. Staff used Surgi foam and held site for 7 minutes and the site wouldn't clott off. Eventually site did clott off. Staff have been reporting this is occurring with the last couple of treatments.   Total UF removed: 1L Post HD weight: 87.6kg   05/19/24 1550  Vitals  Temp 97.6 F (36.4 C)  Temp Source Oral  BP (!) 174/104  MAP (mmHg) 118  Pulse Rate 70  ECG Heart Rate 71  Resp 13  Weight 87.6 kg  Type of Weight Post-Dialysis  Oxygen Therapy  SpO2 99 %  O2 Device Room Air  During Treatment Monitoring  Blood Flow Rate (mL/min) 19 mL/min  Arterial Pressure (mmHg) -48.68 mmHg  Venous Pressure (mmHg) 233.12 mmHg  TMP (mmHg) 20.2 mmHg  Ultrafiltration Rate (mL/min) 819 mL/min  Dialysate Flow Rate (mL/min) 300 ml/min  Dialysate Potassium Concentration 2  Dialysate Calcium  Concentration 2.5  Duration of HD Treatment -hour(s) 3.08 hour(s)  Cumulative Fluid Removed (mL) per Treatment  1158.82  HD Safety Checks Performed Yes  Intra-Hemodialysis Comments Tx completed  Post Treatment  Dialyzer Clearance Lightly streaked  Hemodialysis Intake (mL) 120 mL  Liters Processed 73.9  Fluid Removed (mL) 1200 mL  Tolerated HD Treatment Yes  AVG/AVF Arterial Site Held (minutes) 7 minutes  AVG/AVF Venous Site Held (minutes) 7 minutes  Fistula / Graft Right Upper arm Arteriovenous fistula  No placement date or time found.   Placed prior to admission: Yes  Orientation: Right  Access Location: Upper arm  Access Type: Arteriovenous fistula  Site Condition No complications  Fistula / Graft Assessment Present;Thrill;Bruit  Status Deaccessed  Needle Size 15  Drainage Description None       [facility]

## 2024-05-21 ENCOUNTER — Encounter (HOSPITAL_COMMUNITY): Payer: Self-pay

## 2024-05-21 ENCOUNTER — Emergency Department (HOSPITAL_COMMUNITY)
Admission: EM | Admit: 2024-05-21 | Discharge: 2024-05-21 | Discharge status: Against Medical Advice | Attending: Emergency Medicine | Admitting: Emergency Medicine

## 2024-05-21 DIAGNOSIS — Z992 Dependence on renal dialysis: Secondary | ICD-10-CM | POA: Diagnosis present

## 2024-05-21 DIAGNOSIS — N186 End stage renal disease: Secondary | ICD-10-CM | POA: Insufficient documentation

## 2024-05-21 MED ORDER — ALTEPLASE 2 MG IJ SOLR: 2.0000 mg | Freq: Once | INTRAMUSCULAR | Status: DC | PRN

## 2024-05-21 MED ORDER — LIDOCAINE-PRILOCAINE 2.5-2.5 % EX CREA: 1.0000 | TOPICAL_CREAM | CUTANEOUS | Status: DC | PRN

## 2024-05-21 MED ORDER — CHLORHEXIDINE GLUCONATE CLOTH 2 % EX PADS: 6.0000 | MEDICATED_PAD | Freq: Every day | CUTANEOUS | Status: DC

## 2024-05-21 MED ORDER — LIDOCAINE HCL (PF) 1 % IJ SOLN: 5.0000 mL | INTRAMUSCULAR | Status: DC | PRN

## 2024-05-21 MED ORDER — HEPARIN SODIUM (PORCINE) 1000 UNIT/ML DIALYSIS: 1000.0000 [IU] | INTRAMUSCULAR | Status: DC | PRN

## 2024-05-21 MED ORDER — PENTAFLUOROPROP-TETRAFLUOROETH EX AERO: 1.0000 | INHALATION_SPRAY | CUTANEOUS | Status: DC | PRN

## 2024-05-21 NOTE — ED Provider Notes (Signed)
" °  [place] EMERGENCY DEPARTMENT AT [facility] Provider Note   CSN: [id number] Arrival date & time: 05/21/24  9290     Patient presents with: No chief complaint on file.   Paul [name] is a 59 y.o. male.   59 year old male with ESRD here today for his dialysis.  He has no other complaints at this time.  He has not missed any sessions recently.        Prior to Admission medications  Medication Sig Start Date End Date Taking? Authorizing Provider  ARIPiprazole  ER (ABILIFY  MAINTENA) 400 MG PRSY prefilled syringe Inject 400 mg into the muscle every 28 (twenty-eight) days. 03/24/24   Plovsky, Elna, MD  carvedilol  (COREG ) 6.25 MG tablet Take 1 tablet (6.25 mg total) by mouth 2 (two) times daily with a meal. 01/13/24   Delbert Clam, MD  isosorbide  mononitrate (IMDUR ) 30 MG 24 hr tablet Take 1 tablet (30 mg total) by mouth daily. 04/28/24   Placey, Ronal Caldron, NP  oseltamivir  (TAMIFLU ) 30 MG capsule Take 1 capsule by mouth after your next two dialysis sessions Patient not taking: Reported on 04/28/2024 04/21/24   Randol Simmonds, MD  OVER THE COUNTER MEDICATION Take 1 capsule by mouth daily. Lion[employer]ane for memory    [provider]  rosuvastatin  (CRESTOR ) 5 MG tablet Take 1 tablet (5 mg total) by mouth daily. 03/24/23     sucroferric oxyhydroxide (VELPHORO ) 500 MG chewable tablet Chew 3 tablets (1,500 mg total) by mouth 3 (three) times daily with meals. Patient not taking: Reported on 04/28/2024 07/29/23   Tawkaliyar, Roya, DO    Allergies: Penicillin g    Review of Systems  Updated Vital Signs BP (!) 147/84 (BP Location: Left Arm)   Pulse 74   Temp 97.6 F (36.4 C)   Resp 18   SpO2 97%   Physical Exam Vitals and nursing note reviewed.  Constitutional:      Appearance: He is not toxic-appearing.  Cardiovascular:     Rate and Rhythm: Normal rate.  Pulmonary:     Effort: Pulmonary effort is normal. No respiratory distress.     Breath sounds: No  wheezing.  Neurological:     Mental Status: He is alert.     (all labs ordered are listed, but only abnormal results are displayed) Labs Reviewed - No data to display  EKG: None  Radiology: No results found.   Procedures   Medications Ordered in the ED - No data to display                                  Medical Decision Making -year-old male here today for dialysis.  Plan -Dr. Dennise reached out to me and was able to arrange the patient to be taken upstairs for dialysis.  Presuming no adverse events occurring during dialysis, patient is appropriate for discharge when he returns from dialysis.        Final diagnoses:  Dialysis patient    ED Discharge Orders     None          Mannie Fairy DASEN, DO 05/21/24 0800  "

## 2024-05-21 NOTE — Procedures (Signed)
 I was present at this dialysis session. I have reviewed the session itself and made appropriate changes.  Fistulogram planned for this coming Monday. Confirmed with patient and in Epic.  Filed Weights   05/21/24 1002  Weight: 87.5 kg    Recent Labs  Lab 05/19/24 0653  NA 137  K 4.6  CL 95*  CO2 23  GLUCOSE 81  BUN 56*  CREATININE 9.90*  CALCIUM  7.6*    Recent Labs  Lab 05/19/24 0653  WBC 6.5  NEUTROABS 4.3  HGB 9.6*  HCT 29.2*  MCV 92.4  PLT 214    Scheduled Meds:  Chlorhexidine  Gluconate Cloth  6 each Topical Q0600   Continuous Infusions: PRN Meds:.alteplase , heparin , lidocaine  (PF), lidocaine -prilocaine , pentafluoroprop-tetrafluoroeth   Ephriam Stank, MD [facility] 05/21/2024, 11:45 AM

## 2024-05-21 NOTE — ED Triage Notes (Signed)
 Pt here requesting dialysis, MWF schedule, last treatment 05/19/24.

## 2024-05-21 NOTE — Progress Notes (Signed)
 Mr. Damon presents to the ED for dialysis needs. Pt was discharged from his outpatient unit for behavior issues. Plan for dialysis (ED HD) and then he can return back to the ED for re-assessment. I was informed of ongoing bleeding from AVF lately. I referred him to [facility] today for Fistulogram in outpatient.  Charmaine Piety, NP [facility]

## 2024-05-24 ENCOUNTER — Other Ambulatory Visit: Payer: Self-pay

## 2024-05-24 ENCOUNTER — Ambulatory Visit (HOSPITAL_COMMUNITY)
Admission: RE | Admit: 2024-05-24 | Discharge: 2024-05-24 | Disposition: A | Discharge status: Home / Self Care | Attending: Vascular Surgery | Admitting: Vascular Surgery

## 2024-05-24 ENCOUNTER — Emergency Department (HOSPITAL_COMMUNITY)
Admission: EM | Admit: 2024-05-24 | Discharge: 2024-05-24 | Discharge status: Against Medical Advice | Attending: Emergency Medicine | Admitting: Emergency Medicine

## 2024-05-24 ENCOUNTER — Encounter (HOSPITAL_COMMUNITY)
Admission: RE | Disposition: A | Discharge status: Home / Self Care | Payer: Self-pay | Source: Home / Self Care | Attending: Vascular Surgery

## 2024-05-24 ENCOUNTER — Encounter (HOSPITAL_COMMUNITY): Payer: Self-pay

## 2024-05-24 DIAGNOSIS — R062 Wheezing: Secondary | ICD-10-CM | POA: Insufficient documentation

## 2024-05-24 DIAGNOSIS — I132 Hypertensive heart and chronic kidney disease with heart failure and with stage 5 chronic kidney disease, or end stage renal disease: Secondary | ICD-10-CM | POA: Diagnosis not present

## 2024-05-24 DIAGNOSIS — I5042 Chronic combined systolic (congestive) and diastolic (congestive) heart failure: Secondary | ICD-10-CM | POA: Diagnosis not present

## 2024-05-24 DIAGNOSIS — Z992 Dependence on renal dialysis: Secondary | ICD-10-CM | POA: Insufficient documentation

## 2024-05-24 DIAGNOSIS — T82858A Stenosis of vascular prosthetic devices, implants and grafts, initial encounter: Secondary | ICD-10-CM | POA: Insufficient documentation

## 2024-05-24 DIAGNOSIS — N186 End stage renal disease: Secondary | ICD-10-CM | POA: Insufficient documentation

## 2024-05-24 DIAGNOSIS — Y832 Surgical operation with anastomosis, bypass or graft as the cause of abnormal reaction of the patient, or of later complication, without mention of misadventure at the time of the procedure: Secondary | ICD-10-CM | POA: Insufficient documentation

## 2024-05-24 DIAGNOSIS — Z87891 Personal history of nicotine dependence: Secondary | ICD-10-CM | POA: Insufficient documentation

## 2024-05-24 HISTORY — PX: VENOUS ANGIOPLASTY: CATH118376

## 2024-05-24 HISTORY — PX: A/V FISTULAGRAM: CATH118298

## 2024-05-24 HISTORY — PX: VENOUS STENT: CATH118377

## 2024-05-24 LAB — I-STAT CHEM 8, ED
BUN: 75 mg/dL — ABNORMAL HIGH (ref 6–20)
Calcium, Ion: 0.81 mmol/L — CL (ref 1.15–1.40)
Chloride: 100 mmol/L (ref 98–111)
Creatinine, Ser: 13.6 mg/dL — ABNORMAL HIGH (ref 0.61–1.24)
Glucose, Bld: 92 mg/dL (ref 70–99)
HCT: 28 % — ABNORMAL LOW (ref 39.0–52.0)
Hemoglobin: 9.5 g/dL — ABNORMAL LOW (ref 13.0–17.0)
Potassium: 4.5 mmol/L (ref 3.5–5.1)
Sodium: 139 mmol/L (ref 135–145)
TCO2: 21 mmol/L — ABNORMAL LOW (ref 22–32)

## 2024-05-24 MED ORDER — HEPARIN SODIUM (PORCINE) 1000 UNIT/ML DIALYSIS
2500.0000 [IU] | Freq: Once | INTRAMUSCULAR | Status: AC
  Administered 2024-05-24: 2500 [IU] via INTRAVENOUS_CENTRAL

## 2024-05-24 MED ORDER — LIDOCAINE-PRILOCAINE 2.5-2.5 % EX CREA: 1.0000 | TOPICAL_CREAM | CUTANEOUS | Status: DC | PRN

## 2024-05-24 MED ORDER — ALBUTEROL SULFATE HFA 108 (90 BASE) MCG/ACT IN AERS: 2.0000 | INHALATION_SPRAY | RESPIRATORY_TRACT | Status: DC | PRN

## 2024-05-24 MED ORDER — ANTICOAGULANT SODIUM CITRATE 4% (200MG/5ML) IV SOLN
5.0000 mL | Status: DC | PRN
  Filled 2024-05-24: qty 5

## 2024-05-24 MED ORDER — CHLORHEXIDINE GLUCONATE CLOTH 2 % EX PADS: 6.0000 | MEDICATED_PAD | Freq: Every day | CUTANEOUS | Status: DC

## 2024-05-24 MED ORDER — LIDOCAINE HCL (PF) 1 % IJ SOLN
INTRAMUSCULAR | Status: DC | PRN
  Administered 2024-05-24: 2 mL via SUBCUTANEOUS

## 2024-05-24 MED ORDER — ALTEPLASE 2 MG IJ SOLR: 2.0000 mg | Freq: Once | INTRAMUSCULAR | Status: DC | PRN

## 2024-05-24 MED ORDER — ALBUTEROL SULFATE (2.5 MG/3ML) 0.083% IN NEBU: 2.5000 mg | INHALATION_SOLUTION | RESPIRATORY_TRACT | Status: DC | PRN

## 2024-05-24 MED ORDER — HEPARIN SODIUM (PORCINE) 1000 UNIT/ML IJ SOLN
INTRAMUSCULAR | Status: AC
  Filled 2024-05-24: qty 3

## 2024-05-24 MED ORDER — HEPARIN SODIUM (PORCINE) 1000 UNIT/ML DIALYSIS: 1000.0000 [IU] | INTRAMUSCULAR | Status: DC | PRN

## 2024-05-24 MED ORDER — LIDOCAINE HCL (PF) 1 % IJ SOLN: 5.0000 mL | INTRAMUSCULAR | Status: DC | PRN

## 2024-05-24 MED ORDER — LIDOCAINE HCL (PF) 1 % IJ SOLN
INTRAMUSCULAR | Status: AC
  Filled 2024-05-24: qty 30

## 2024-05-24 MED ORDER — IODIXANOL 320 MG/ML IV SOLN
INTRAVENOUS | Status: DC | PRN
  Administered 2024-05-24: 35 mL via INTRAVENOUS

## 2024-05-24 MED ORDER — PENTAFLUOROPROP-TETRAFLUOROETH EX AERO: 1.0000 | INHALATION_SPRAY | CUTANEOUS | Status: DC | PRN

## 2024-05-24 MED ORDER — HEPARIN (PORCINE) IN NACL 1000-0.9 UT/500ML-% IV SOLN
INTRAVENOUS | Status: DC | PRN
  Administered 2024-05-24: 500 mL

## 2024-05-24 NOTE — ED Triage Notes (Signed)
 Pt came in via POV requesting dialysis, denies missing any recent Tx & endorses he has been wheezing for a couple of weeks.

## 2024-05-24 NOTE — H&P (Signed)
 " HD ACCESS CENTER H&P   Patient ID: Paul [name], male   DOB: 13-Oct-1965, 59 y.o.   MRN: [id number]  Subjective:     HPI Paul [name] is a 59 y.o. male with ESRD presenting for evaluation of HD access and consideration of intervention. Referred by: Emergency Department Having issues with Prolonged bleeding after decannulation   Past Medical History:  Diagnosis Date   Bipolar disorder (HCC)    Central retinal artery occlusion    with macular infarction of the right eye. status post posterior vitrectomy and photocoagulation with insertion of a shunt in 2006.   CHF (congestive heart failure) (HCC)    chronic mixed syst/diast. 02/2009 echo: Systolic function was mildly reduced. The estimated ejection fraction was in 45%, global HK, Grade I Diast dysfxn.   Chronic kidney disease    ESRD   Heart failure    Obesity    Osteoarthritis    s/p right hip replacement in 2001   Schizophrenia (HCC)    Severe uncontrolled hypertension    Family History  Problem Relation Age of Onset   Hypertension Father    Stroke Father    Past Surgical History:  Procedure Laterality Date   A/V FISTULAGRAM Right 02/05/2024   Procedure: A/V Fistulagram;  Surgeon: Serene Gaile ORN, MD;  Location: [facility];  Service: Cardiovascular;  Laterality: Right;   A/V SHUNT INTERVENTION N/A 10/08/2023   Procedure: A/V SHUNT INTERVENTION;  Surgeon: Sheree Penne Bruckner, MD;  Location: [facility];  Service: Cardiovascular;  Laterality: N/A;   AV FISTULA PLACEMENT Right 07/08/2022   Procedure: RIGHT RADIOCEPHALIC ARTERIOVENOUS (AV) FISTULA CREATION;  Surgeon: Lanis Fonda BRAVO, MD;  Location: [facility];  Service: Vascular;  Laterality: Right;   IR FLUORO GUIDE CV LINE RIGHT  05/13/2022   IR US  GUIDE VASC ACCESS RIGHT  05/13/2022   JOINT REPLACEMENT     right hip replacement   right eye surgery     for glaucoma   TOTAL HIP ARTHROPLASTY Bilateral 1997, 2001   UPPER EXTREMITY INTERVENTION  10/08/2023   Procedure:  UPPER EXTREMITY INTERVENTION;  Surgeon: Sheree Penne Bruckner, MD;  Location: [facility];  Service: Cardiovascular;;  Stent   VENOUS ANGIOPLASTY  10/08/2023   Procedure: VENOUS ANGIOPLASTY;  Surgeon: Sheree Penne Bruckner, MD;  Location: [facility];  Service: Cardiovascular;;   VENOUS ANGIOPLASTY Right 02/05/2024   Procedure: VENOUS ANGIOPLASTY;  Surgeon: Serene Gaile ORN, MD;  Location: [facility];  Service: Cardiovascular;  Laterality: Right;  Cephalic Vein; Cephalic Arch    Short Social History:  Social History   Tobacco Use   Smoking status: Former   Smokeless tobacco: Never   Tobacco comments:    Quit smoking 20-30 years ago, smoked for 1 year.  Substance Use Topics   Alcohol use: No    Allergies[1]  No current facility-administered medications for this encounter.    REVIEW OF SYSTEMS All other systems were reviewed and are negative     Objective:   Objective   Vitals:   05/24/24 0723  BP: (!) 144/93  Pulse: 72  Resp: 16  SpO2: 97%   There is no height or weight on file to calculate BMI.  Physical Exam General: no acute distress Cardiac: hemodynamically stable Extremities: Palpable pulse and thrill in right arm AVF  Data: Reviewed fistulogram by Dr. Serene from October 2025     Assessment/Plan:   Paul [name] is a 59 y.o. male with ESRD and a poorly functioning  right arm AVF. Having issues with prolonged bleeding after decannulation. Last HD session yesterday.  We discussed first-line option of fistulogram to better evaluate flow as well as potentially treat flow-limiting stenoses that could put this access at risk of future thrombosis. Risks and benefits were reviewed and the patient elected to proceed.   Norman Serve, MD [facility]      [1]  Allergies Allergen Reactions   Penicillin G Nausea And Vomiting   "

## 2024-05-24 NOTE — Progress Notes (Signed)
" °   05/24/24 1713  Vitals  Temp Source Oral  BP (!) 152/64  BP Location Left Arm  BP Method Automatic  Patient Position (if appropriate) Lying  Pulse Rate 77  Pulse Rate Source Monitor  ECG Heart Rate 78  Resp 13  Type of Weight Post-Dialysis  Oxygen Therapy  SpO2 99 %  O2 Device Room Air  During Treatment Monitoring  HD Safety Checks Performed Yes  Intra-Hemodialysis Comments Tx completed;Tolerated well  Post Treatment  Dialyzer Clearance Lightly streaked  Liters Processed 69.9  Fluid Removed (mL) 1500 mL  Tolerated HD Treatment Yes  AVG/AVF Arterial Site Held (minutes) 10 minutes  AVG/AVF Venous Site Held (minutes) 6 minutes  Fistula / Graft Right Upper arm Arteriovenous fistula  No placement date or time found.   Placed prior to admission: Yes  Orientation: Right  Access Location: Upper arm  Access Type: Arteriovenous fistula  Site Condition No complications  Fistula / Graft Assessment Present;Thrill;Bruit  Status Deaccessed  Drainage Description None    "

## 2024-05-24 NOTE — Op Note (Signed)
" ° ° °  Patient name: Paul [name] MRN: [id number] DOB: 1965-06-22 Sex: male  05/24/2024 Pre-operative Diagnosis: ESRD on HD Post-operative diagnosis:  Same Surgeon:  Norman GORMAN Serve, MD Procedure Performed:  Outpatient evaluation, level 3 Ultrasound-guided access of dialysis, fistulogram and central venogram, peripheral balloon angioplasty and stenting.  [address]  Indications: [name] is a 59-year-old male with ESRD on HD and poorly functioning right arm AV fistula.  He has been having prolonged bleeding after decannulation.  His last HD session was yesterday.  We discussed there is and benefits of fistulogram and he elected to proceed.  Findings:  Widely patent central venous system.  Patent cephalic arch stent, 70% stenosis on the peripheral edge of the cephalic arch stent.  Multifocal diffuse disease throughout the fistula approximately 60% at worst area.  Patent anastomosis   Procedure:  The patient was identified in the holding area and taken to the cath lab  The patient was then placed supine on the table and prepped and draped in the usual sterile fashion.  A time out was called.  Ultrasound was used to evaluate the right arm AV access. This was accessed under u/s guidance. An 018 wire was advanced without resistance, a micropuncture sheath was placed and fistulagram obtained which demonstrated the above findings.  This access was then upsized to a 29F short sheath.  A Bentson wire was used to cross the lesion and this was first treated with an 8 mm Mustang balloon.  The entirety of the fistula was ballooned with an 8 mm x 100 mm Mustang balloon.  There continued to be a severe stenosis on the peripheral edge of the cephalic arch stent and therefore I elected to extend the stent further into the cephalic vein.  The 6 French sheath was exchanged for an 8 French sheath over the Bentson wire and an 8 mm x 5 cm Viabahn stent was placed across the lesion and postdilated with an 8 mm Mustang  balloon.  There was minimal residual stenosis and improvement of the thrill.  The wire and sheath were removed and the access was managed with a 4 Monocryl figure-of-eight suture for hemostasis.  Contrast: 35 cc Sedation: None  Impression: Successful stenting upper arm cephalic vein, 8 mm x 5 cm Viabahn stent.   Norman GORMAN Serve MD [facility] [place] Office: [phone]  "

## 2024-05-24 NOTE — ED Notes (Signed)
 EDP notified of pt c/o shob, wheezing.

## 2024-05-24 NOTE — ED Provider Notes (Signed)
 " [facility] Provider Note   CSN: [id number] Arrival date & time: 05/24/24  9098     Patient presents with: No chief complaint on file.   Eliaz Fout Burpee is a 59 y.o. male.   HPI 59 year old male history of end-stage renal disease on dialysis last dialysis Friday here for dialysis.  Reports he has had some ongoing wheezing but unchanged from prior.  He is not having any fever, chills, productive cough.  Denies any chest pain.  He reports taking his home medications as prescribed.    Prior to Admission medications  Medication Sig Start Date End Date Taking? Authorizing Provider  ARIPiprazole  ER (ABILIFY  MAINTENA) 400 MG PRSY prefilled syringe Inject 400 mg into the muscle every 28 (twenty-eight) days. 03/24/24   Plovsky, Elna, MD  carvedilol  (COREG ) 6.25 MG tablet Take 1 tablet (6.25 mg total) by mouth 2 (two) times daily with a meal. 01/13/24   Delbert Clam, MD  isosorbide  mononitrate (IMDUR ) 30 MG 24 hr tablet Take 1 tablet (30 mg total) by mouth daily. 04/28/24   Placey, Ronal Caldron, NP  oseltamivir  (TAMIFLU ) 30 MG capsule Take 1 capsule by mouth after your next two dialysis sessions Patient not taking: Reported on 04/28/2024 04/21/24   Randol Simmonds, MD  OVER THE COUNTER MEDICATION Take 1 capsule by mouth daily. [place] for memory    [provider]  rosuvastatin  (CRESTOR ) 5 MG tablet Take 1 tablet (5 mg total) by mouth daily. 03/24/23     sucroferric oxyhydroxide (VELPHORO ) 500 MG chewable tablet Chew 3 tablets (1,500 mg total) by mouth 3 (three) times daily with meals. Patient not taking: Reported on 04/28/2024 07/29/23   Tawkaliyar, Roya, DO    Allergies: Penicillin g    Review of Systems  Updated Vital Signs BP (!) 158/89   Pulse 81   Temp (!) 97.4 F (36.3 C)   Resp 20   Ht 1.6 m (5' 3)   Wt 84.8 kg   SpO2 95%   BMI 33.13 kg/m   Physical Exam Vitals and nursing note reviewed.  Constitutional:       Appearance: He is well-developed.  HENT:     Head: Normocephalic and atraumatic.     Right Ear: External ear normal.     Left Ear: External ear normal.     Nose: Nose normal.  Eyes:     Extraocular Movements: Extraocular movements intact.  Neck:     Trachea: No tracheal deviation.  Pulmonary:     Effort: Pulmonary effort is normal.  Musculoskeletal:        General: Normal range of motion.  Skin:    General: Skin is warm and dry.  Neurological:     Mental Status: He is alert and oriented to person, place, and time.  Psychiatric:        Mood and Affect: Mood normal.        Behavior: Behavior normal.     (all labs ordered are listed, but only abnormal results are displayed) Labs Reviewed  I-STAT CHEM 8, ED - Abnormal; Notable for the following components:      Result Value   BUN 75 (*)    Creatinine, Ser 13.60 (*)    Calcium , Ion 0.81 (*)    TCO2 21 (*)    Hemoglobin 9.5 (*)    HCT 28.0 (*)    All other components within normal limits    EKG: None  Radiology: PERIPHERAL VASCULAR CATHETERIZATION Result  Date: 05/24/2024 Images from the original result were not included. Patient name: BENGIE KAUCHER MRN: [id number] DOB: 11/21/1965 Sex: male 05/24/2024 Pre-operative Diagnosis: ESRD on HD Post-operative diagnosis:  Same Surgeon:  Norman GORMAN Serve, MD Procedure Performed: Outpatient evaluation, level 3 Ultrasound-guided access of dialysis, fistulogram and central venogram, peripheral balloon angioplasty and stenting.  [address]96 Indications: Mr. Alperin is a 29-year-old male with ESRD on HD and poorly functioning right arm AV fistula.  He has been having prolonged bleeding after decannulation.  His last HD session was yesterday.  We discussed there is and benefits of fistulogram and he elected to proceed. Findings: Widely patent central venous system.  Patent cephalic arch stent, 70% stenosis on the peripheral edge of the cephalic arch stent.  Multifocal diffuse disease throughout the  fistula approximately 60% at worst area.  Patent anastomosis  Procedure:  The patient was identified in the holding area and taken to the cath lab  The patient was then placed supine on the table and prepped and draped in the usual sterile fashion.  A time out was called.  Ultrasound was used to evaluate the right arm AV access. This was accessed under u/s guidance. An 018 wire was advanced without resistance, a micropuncture sheath was placed and fistulagram obtained which demonstrated the above findings.  This access was then upsized to a 33F short sheath.  A Bentson wire was used to cross the lesion and this was first treated with an 8 mm Mustang balloon.  The entirety of the fistula was ballooned with an 8 mm x 100 mm Mustang balloon.  There continued to be a severe stenosis on the peripheral edge of the cephalic arch stent and therefore I elected to extend the stent further into the cephalic vein.  The 6 French sheath was exchanged for an 8 French sheath over the Bentson wire and an 8 mm x 5 cm Viabahn stent was placed across the lesion and postdilated with an 8 mm Mustang balloon.  There was minimal residual stenosis and improvement of the thrill.  The wire and sheath were removed and the access was managed with a 4 Monocryl figure-of-eight suture for hemostasis. Contrast: 35 cc Sedation: None Impression: Successful stenting upper arm cephalic vein, 8 mm x 5 cm Viabahn stent. Norman GORMAN Serve MD [facility] [place] Office: [phone]    Procedures   Medications Ordered in the ED - No data to display                                  Medical Decision Making  59 year old male well-known to us  presents today for dialysis.  Patient is alert and oriented with mild hypertension.  He appears to be in no distress.  Chest x-Marlaysia Lenig was obtained for same complaints on 117 and showed no acute cardiopulmonary process.  Do not feel that there is an indication to repeat today. Plan  consultation to nephrology for dialysis I-STAT reviewed hemoglobin stable at 9.5 Electrolytes normal Message Dr. Geralynn with nephrology-care discussed with Dr. Geralynn who will oversee dialysis    Final diagnoses:  ESRD (end stage renal disease) [facility])  Dialysis patient    ED Discharge Orders     None          Levander Houston, MD 05/24/24 1013  "

## 2024-05-24 NOTE — Procedures (Signed)
 HD Note:  Some information was entered later than the data was gathered due to patient care needs. The stated time with the data is accurate.  Received patient in bed to unit.   Alert and oriented.   Informed consent signed and in chart.   Access used: Upper right arm fistula Access issues: None  Patient had no issues during this staff members observation.  Patient handed over to oncoming dialysis nurse for treatment completion  Jabarie Pop L. Lenon, RN [facility] Dialysis Unit.

## 2024-05-24 NOTE — Procedures (Addendum)
 Asked to see this patient for hospital dialysis. The plan will be for ED HD. Pt is not to be admitted at this time. Pt will go to the dialysis unit when they are ready for the patient. When dialysis is completed pt will be sent back to ED for reassessment.   Vitals:   05/24/24 0913 05/24/24 0914 05/24/24 0916  BP:  (!) 158/89   Pulse: 81    Resp: 20    Temp: (!) 97.4 F (36.3 C)    SpO2: 95%    Weight:   84.8 kg  Height:   5' 3 (1.6 m)    Usual HD: 2- 3 x per week  hospital HD  3-3.5h    UF 1.5L  Hep  2500   AVF     I was present at the procedure, reviewed the HD regimen and made appropriate changes.   Myer Fret MD  [facility] 05/24/2024, 9:42 AM    Recent Labs  Lab 05/19/24 0653 05/24/24 0928  HGB 9.6* 9.5*  ALBUMIN 4.3  --   CALCIUM  7.6*  --   CREATININE 9.90* 13.60*  K 4.6 4.5   No results for input(s): IRON , TIBC, FERRITIN in the last 168 hours. Inpatient medications:  ARIPiprazole  ER  400 mg Intramuscular Q28 days   Chlorhexidine  Gluconate Cloth  6 each Topical Q0600

## 2024-05-25 ENCOUNTER — Ambulatory Visit (HOSPITAL_COMMUNITY): Admitting: *Deleted

## 2024-05-25 ENCOUNTER — Ambulatory Visit (HOSPITAL_COMMUNITY): Admitting: Psychiatry

## 2024-05-25 VITALS — BP 167/93 | Resp 20

## 2024-05-25 VITALS — BP 167/93 | HR 84 | Resp 20 | Ht 63.0 in | Wt 187.0 lb

## 2024-05-25 DIAGNOSIS — F250 Schizoaffective disorder, bipolar type: Secondary | ICD-10-CM

## 2024-05-25 DIAGNOSIS — F22 Delusional disorders: Secondary | ICD-10-CM

## 2024-05-25 DIAGNOSIS — F259 Schizoaffective disorder, unspecified: Secondary | ICD-10-CM | POA: Diagnosis not present

## 2024-05-25 NOTE — Patient Instructions (Addendum)
 Pt in clinic today for due Abilify  Maintena 400 mg injection and has a visit with Dr. Tasia after injection. Pt presents with flat affect. He is cooperative on approach. Pt says he has not had any mental health issues since last injection despite having to reschedule a couple times due to transportation and dialysis schedule. Pt would like to return to dialysis center if possible (instead of presenting to ED), but believes he was banned because he is a man.  Maribel is also interested in dating he says. Pt has audible wheezing upon inspiration but pt says he has not had increased SOB or cough and recently had the flu. Injection was prepared as ordered and given in RUOQ without complaint. Pt to return in 28 days for next due injection. Support and encouragement provided to pt.

## 2024-05-25 NOTE — Progress Notes (Signed)
 " Psychiatric Initial Adult Assessment   Patient Identification: TOME WILSON MRN:  [id number] Date of Evaluation:  05/25/2024 Referral Source: Barnie Louder Chief Complaint: I cannot think straight Chief Complaint  Patient presents with   Injections   Visit Diagnosis: Schizoaffective disorder  History of Present Illness:      Today the patient is seen in the office.  Today he got his Abilify  IM injection.  He has been very compliant with this injection.  The patient is very interested in wishing to get into a dialysis clinic where he can have more time to look for a job and do other things.  We wrote a letter of support but today we spoke to his navigator Randine who says the dialysis clinics are still hesitant to have him.  There is a new dialysis clinic that is opening soon and they are working on insurance issues with this patient to be there.  Meanwhile the patient is living independently at a sober house.  He is free of drugs and alcohol by his report.  His mood is good.  He is calm.  He feels much less angry.  He goes to a men support group.  His primary care physician is Dr. Barnie Louder.  Presently I believe he goes to dialysis clinic at the hospital.  The patient denies any psychotic symptoms at all.  He denies any mildly agitated states.   Noted is the patient has been out of prison since 2021. Depression Symptoms:  depressed mood, (Hypo) Manic Symptoms:   Anxiety Symptoms:   Psychotic Symptoms:   PTSD Symptoms: Negative Past Psychiatric History: 3 psychiatric hospitalizations  Previous Psychotropic Medications: Yes   Substance Abuse History in the last 12 months:  Yes.    Consequences of Substance Abuse: NA  Past Medical History:  Past Medical History:  Diagnosis Date   Bipolar disorder (HCC)    Central retinal artery occlusion    with macular infarction of the right eye. status post posterior vitrectomy and photocoagulation with insertion of a shunt in 2006.    CHF (congestive heart failure) (HCC)    chronic mixed syst/diast. 02/2009 echo: Systolic function was mildly reduced. The estimated ejection fraction was in 45%, global HK, Grade I Diast dysfxn.   Chronic kidney disease    ESRD   Heart failure    Obesity    Osteoarthritis    s/p right hip replacement in 2001   Schizophrenia (HCC)    Severe uncontrolled hypertension     Past Surgical History:  Procedure Laterality Date   A/V FISTULAGRAM Right 02/05/2024   Procedure: A/V Fistulagram;  Surgeon: Serene Gaile ORN, MD;  Location: [facility];  Service: Cardiovascular;  Laterality: Right;   A/V FISTULAGRAM Right 05/24/2024   Procedure: A/V Fistulagram;  Surgeon: Pearline Norman RAMAN, MD;  Location: [facility];  Service: Cardiovascular;  Laterality: Right;   A/V SHUNT INTERVENTION N/A 10/08/2023   Procedure: A/V SHUNT INTERVENTION;  Surgeon: Sheree Penne Bruckner, MD;  Location: [facility];  Service: Cardiovascular;  Laterality: N/A;   AV FISTULA PLACEMENT Right 07/08/2022   Procedure: RIGHT RADIOCEPHALIC ARTERIOVENOUS (AV) FISTULA CREATION;  Surgeon: Lanis Fonda BRAVO, MD;  Location: [facility] [place] OR;  Service: Vascular;  Laterality: Right;   IR FLUORO GUIDE CV LINE RIGHT  05/13/2022   IR US  GUIDE VASC ACCESS RIGHT  05/13/2022   JOINT REPLACEMENT     right hip replacement   right eye surgery     for glaucoma   TOTAL  HIP ARTHROPLASTY Bilateral 1997, 2001   UPPER EXTREMITY INTERVENTION  10/08/2023   Procedure: UPPER EXTREMITY INTERVENTION;  Surgeon: Sheree Penne Bruckner, MD;  Location: [facility];  Service: Cardiovascular;;  Stent   VENOUS ANGIOPLASTY  10/08/2023   Procedure: VENOUS ANGIOPLASTY;  Surgeon: Sheree Penne Bruckner, MD;  Location: [facility];  Service: Cardiovascular;;   VENOUS ANGIOPLASTY Right 02/05/2024   Procedure: VENOUS ANGIOPLASTY;  Surgeon: Serene Gaile ORN, MD;  Location: [facility];  Service: Cardiovascular;  Laterality: Right;  Cephalic Vein; Cephalic Arch   VENOUS ANGIOPLASTY   05/24/2024   Procedure: VENOUS ANGIOPLASTY;  Surgeon: Pearline Norman RAMAN, MD;  Location: [facility];  Service: Cardiovascular;;  Cephalic Arch/Stent; Outflow Cephalic Vein   VENOUS STENT  8/80/7973   Procedure: VENOUS STENT;  Surgeon: Pearline Norman RAMAN, MD;  Location: [facility];  Service: Cardiovascular;;  Cephalic Arch - 8x5 [place]ahn    Family Psychiatric History:   Family History:  Family History  Problem Relation Age of Onset   Hypertension Father    Stroke Father     Social History:   Social History   Socioeconomic History   Marital status: Legally Separated    Spouse name: Not on file   Number of children: Not on file   Years of education: Not on file   Highest education level: Not on file  Occupational History   Occupation: chartered certified accountant  Tobacco Use   Smoking status: Former   Smokeless tobacco: Never   Tobacco comments:    Quit smoking 20-30 years ago, smoked for 1 year.  Vaping Use   Vaping status: Never Used  Substance and Sexual Activity   Alcohol use: No   Drug use: No   Sexual activity: Yes    Birth control/protection: None  Other Topics Concern   Not on file  Social History Narrative   Not on file   Social Drivers of Health   Tobacco Use: Medium Risk (05/24/2024)   Patient History    Smoking Tobacco Use: Former    Smokeless Tobacco Use: Never    Passive Exposure: Not on file  Financial Resource Strain: High Risk (09/15/2023)   Overall Financial Resource Strain (CARDIA)    Difficulty of Paying Living Expenses: Very hard  Food Insecurity: No Food Insecurity (09/15/2023)   Hunger Vital Sign    Worried About Running Out of Food in the Last Year: Never true    Ran Out of Food in the Last Year: Never true  Transportation Needs: Unmet Transportation Needs (09/15/2023)   PRAPARE - Transportation    Lack of Transportation (Medical): Yes    Lack of Transportation (Non-Medical): Yes  Physical Activity: Not on file  Stress: No Stress Concern Present (09/15/2023)    [employer] of Occupational Health - Occupational Stress Questionnaire    Feeling of Stress : Only a little  Social Connections: Moderately Integrated (07/01/2023)   Social Connection and Isolation Panel    Frequency of Communication with Friends and Family: Three times a week    Frequency of Social Gatherings with Friends and Family: Never    Attends Religious Services: More than 4 times per year    Active Member of Clubs or Organizations: Yes    Attends Banker Meetings: More than 4 times per year    Marital Status: Divorced  Depression (PHQ2-9): Medium Risk (01/13/2024)   Depression (PHQ2-9)    PHQ-2 Score: 7  Alcohol Screen: Low Risk (07/01/2023)   Alcohol Screen  Last Alcohol Screening Score (AUDIT): 0  Housing: Low Risk (09/15/2023)   Housing Stability Vital Sign    Unable to Pay for Housing in the Last Year: No    Number of Times Moved in the Last Year: 0    Homeless in the Last Year: No  Utilities: Not At Risk (09/15/2023)   AHC Utilities    Threatened with loss of utilities: No  Health Literacy: Inadequate Health Literacy (09/15/2023)   B1300 Health Literacy    Frequency of need for help with medical instructions: Sometimes    Additional Social History:   Allergies:   Allergies  Allergen Reactions   Penicillin G Nausea And Vomiting    Metabolic Disorder Labs: Lab Results  Component Value Date   HGBA1C 5.5 06/04/2021   MPG 111.15 06/04/2021   No results found for: PROLACTIN Lab Results  Component Value Date   CHOL 163 06/05/2021   TRIG 230 (H) 06/05/2021   HDL 37 (L) 06/05/2021   CHOLHDL 4.4 06/05/2021   VLDL 46 (H) 06/05/2021   LDLCALC 80 06/05/2021   LDLCALC  12/16/2008    86        Total Cholesterol/HDL:CHD Risk Coronary Heart Disease Risk Table                     Men   Women  1/2 Average Risk   3.4   3.3  Average Risk       5.0   4.4  2 X Average Risk   9.6   7.1  3 X Average Risk  23.4   11.0        Use the calculated Patient  Ratio above and the CHD Risk Table to determine the patient's CHD Risk.        ATP III CLASSIFICATION (LDL):  <100     mg/dL   Optimal  899-870  mg/dL   Near or Above                    Optimal  130-159  mg/dL   Borderline  839-810  mg/dL   High  >809     mg/dL   Very High     Therapeutic Level Labs: No results found for: LITHIUM No results found for: CBMZ No results found for: VALPROATE  Current Medications: Current Outpatient Medications  Medication Sig Dispense Refill   ARIPiprazole  ER (ABILIFY  MAINTENA) 400 MG PRSY prefilled syringe Inject 400 mg into the muscle every 28 (twenty-eight) days. 1 each 11   carvedilol  (COREG ) 6.25 MG tablet Take 1 tablet (6.25 mg total) by mouth 2 (two) times daily with a meal. 180 tablet 1   isosorbide  mononitrate (IMDUR ) 30 MG 24 hr tablet Take 1 tablet (30 mg total) by mouth daily. 90 tablet 0   oseltamivir  (TAMIFLU ) 30 MG capsule Take 1 capsule by mouth after your next two dialysis sessions (Patient not taking: Reported on 04/28/2024) 2 capsule 0   OVER THE COUNTER MEDICATION Take 1 capsule by mouth daily. Lion's Mane for memory     rosuvastatin  (CRESTOR ) 5 MG tablet Take 1 tablet (5 mg total) by mouth daily. 90 tablet 3   sucroferric oxyhydroxide (VELPHORO ) 500 MG chewable tablet Chew 3 tablets (1,500 mg total) by mouth 3 (three) times daily with meals. (Patient not taking: Reported on 04/28/2024) 90 tablet 0   Current Facility-Administered Medications  Medication Dose Route Frequency Provider Last Rate Last Admin   ARIPiprazole  ER (ABILIFY  MAINTENA) 400 MG prefilled  syringe 400 mg  400 mg Intramuscular Q28 days Tasia Lung, MD   400 mg at 05/25/24 1600    Musculoskeletal: Strength & Muscle Tone: abnormal Gait & Station: ataxic Patient leans: Right  Psychiatric Specialty Exam: Review of Systems  Blood pressure (!) 167/93, pulse 84, resp. rate 20, height 5' 3 (1.6 m), weight 187 lb (84.8 kg).Body mass index is 33.13 kg/m.   General Appearance: Casual  Eye Contact:  Fair  Speech:  Clear and Coherent  Volume:  Normal  Mood:  NA  Affect:  Appropriate  Thought Process:  Coherent  Orientation:  Full (Time, Place, and Person)  Thought Content:  Delusions  Suicidal Thoughts:  No  Homicidal Thoughts:  No  Memory:  Negative  Judgement:  Poor  Insight:  Lacking  Psychomotor Activity:  Normal  Concentration:    Recall:  Fair  Fund of Knowledge:Fair  Language: Good  Akathisia:  No  Handed:  Right  AIMS (if indicated  Assets:  Desire for Improvement  ADL's:  Intact  Cognition: WNL  Sleep:  Good   Screenings: CAGE-AID    Flowsheet Row ED to Hosp-Admission (Discharged) from 07/24/2023 in [facility]  CAGE-AID Score 0   GAD-7    Flowsheet Row Office Visit from 09/15/2023 in [facility] [place] - A Dept Of [place]. [facility]  Total GAD-7 Score 1   PHQ2-9    Flowsheet Row Office Visit from 01/13/2024 in [facility] [place] - A Dept Of [place]. [facility] Office Visit from 09/15/2023 in [facility] - A Dept Of [place] [facility]. [facility] Office Visit from 06[facility]07/2021 in [facility] - A Dept Of [place] [facility]. [facility] Office Visit from 11/03/2019 in [facility] - A Dept Of [employer]. [facility]  PHQ-2 Total Score 2 0 0 0  PHQ-9 Total Score 7 -- -- 3   Flowsheet Row ED from 05/24/2024 in [facility] Department at [facility] ED from 05/21/2024 in [facility] Department at [facility] ED from 05/19/2024 in [facility] Emergency Department at [facility]  C-SSRS RISK CATEGORY No Risk No Risk No Risk    A   This patient's diagnosis is delusional disorder.  He takes Abilify  for this problem.  He has had issues with mood stability which is better now.  Patient return to see me in 6 to 7 weeks.  He will continue getting his  injection as prescribed.  I believe he is stable I do not believe that is dangerous to himself or to others. Collaboration of Care:   Patient/Guardian was advised Release of Information must be obtained prior to any record release in order to collaborate their care with an outside provider. Patient/Guardian was advised if they have not already done so to contact the registration department to sign all necessary forms in order for us  to release information regarding their care.   Consent: Patient/Guardian gives verbal consent for treatment and assignment of benefits for services provided during this visit. Patient/Guardian expressed understanding and agreed to proceed.   Lung LILLETTE Tasia, MD 1/20/20264:36 PM  "

## 2024-05-25 NOTE — Patient Instructions (Signed)
 Pt

## 2024-05-26 ENCOUNTER — Encounter (HOSPITAL_COMMUNITY): Payer: Self-pay

## 2024-05-26 ENCOUNTER — Telehealth: Payer: Self-pay | Admitting: Internal Medicine

## 2024-05-26 ENCOUNTER — Emergency Department (HOSPITAL_COMMUNITY)
Admission: EM | Admit: 2024-05-26 | Discharge: 2024-05-26 | Discharge status: Against Medical Advice | Attending: Emergency Medicine | Admitting: Emergency Medicine

## 2024-05-26 ENCOUNTER — Other Ambulatory Visit: Payer: Self-pay

## 2024-05-26 DIAGNOSIS — N186 End stage renal disease: Secondary | ICD-10-CM | POA: Insufficient documentation

## 2024-05-26 DIAGNOSIS — I132 Hypertensive heart and chronic kidney disease with heart failure and with stage 5 chronic kidney disease, or end stage renal disease: Secondary | ICD-10-CM | POA: Insufficient documentation

## 2024-05-26 DIAGNOSIS — Z5321 Procedure and treatment not carried out due to patient leaving prior to being seen by health care provider: Secondary | ICD-10-CM | POA: Diagnosis not present

## 2024-05-26 DIAGNOSIS — I509 Heart failure, unspecified: Secondary | ICD-10-CM | POA: Diagnosis not present

## 2024-05-26 DIAGNOSIS — Z992 Dependence on renal dialysis: Secondary | ICD-10-CM | POA: Insufficient documentation

## 2024-05-26 MED ORDER — ANTICOAGULANT SODIUM CITRATE 4% (200MG/5ML) IV SOLN: 5.0000 mL | Status: DC | PRN

## 2024-05-26 MED ORDER — LIDOCAINE HCL (PF) 1 % IJ SOLN: 5.0000 mL | INTRAMUSCULAR | Status: DC | PRN

## 2024-05-26 MED ORDER — HEPARIN SODIUM (PORCINE) 1000 UNIT/ML DIALYSIS: 1000.0000 [IU] | INTRAMUSCULAR | Status: DC | PRN

## 2024-05-26 MED ORDER — ALTEPLASE 2 MG IJ SOLR: 2.0000 mg | Freq: Once | INTRAMUSCULAR | Status: DC | PRN

## 2024-05-26 MED ORDER — HEPARIN SODIUM (PORCINE) 1000 UNIT/ML DIALYSIS
2500.0000 [IU] | Freq: Once | INTRAMUSCULAR | Status: AC
  Administered 2024-05-26: 2500 [IU] via INTRAVENOUS_CENTRAL

## 2024-05-26 MED ORDER — CHLORHEXIDINE GLUCONATE CLOTH 2 % EX PADS: 6.0000 | MEDICATED_PAD | Freq: Every day | CUTANEOUS | Status: DC

## 2024-05-26 MED ORDER — HEPARIN SODIUM (PORCINE) 1000 UNIT/ML IJ SOLN
INTRAMUSCULAR | Status: AC
  Filled 2024-05-26: qty 3

## 2024-05-26 MED ORDER — LIDOCAINE-PRILOCAINE 2.5-2.5 % EX CREA: 1.0000 | TOPICAL_CREAM | CUTANEOUS | Status: DC | PRN

## 2024-05-26 MED ORDER — PENTAFLUOROPROP-TETRAFLUOROETH EX AERO: 1.0000 | INHALATION_SPRAY | CUTANEOUS | Status: DC | PRN

## 2024-05-26 NOTE — Progress Notes (Addendum)
 Received patient in bed to unit.  Alert and oriented.  Informed consent signed and in chart.   TX duration:3:30  Patient tolerated well.  Transported back to the room  Alert, without acute distress.  Hand-off given to patient's nurse.   Access used: AVF Access issues: NA  Total UF removed: 1.5L Post HD weight: 87.6KG     05/26/24 1808  Vitals  Temp 98.4 F (36.9 C)  Temp Source Oral  BP (!) 166/97  MAP (mmHg) 115  Pulse Rate 80  ECG Heart Rate 78  Resp 14  Oxygen Therapy  SpO2 98 %  O2 Device Room Air  During Treatment Monitoring  Blood Flow Rate (mL/min) 399 mL/min  Arterial Pressure (mmHg) -84.44 mmHg  Venous Pressure (mmHg) 295.74 mmHg  TMP (mmHg) 14.95 mmHg  Ultrafiltration Rate (mL/min) 740 mL/min  Dialysate Flow Rate (mL/min) 300 ml/min  Duration of HD Treatment -hour(s) 3.46 hour(s)  Cumulative Fluid Removed (mL) per Treatment  1471.43  HD Safety Checks Performed Yes  Intra-Hemodialysis Comments Tx completed  Post Treatment  Dialyzer Clearance Lightly streaked  Hemodialysis Intake (mL) 120 mL  Liters Processed 84  Fluid Removed (mL) 1500 mL  Tolerated HD Treatment Yes  AVG/AVF Arterial Site Held (minutes) 5 minutes  AVG/AVF Venous Site Held (minutes) 5 minutes  Fistula / Graft Right Upper arm Arteriovenous fistula  No placement date or time found.   Placed prior to admission: Yes  Orientation: Right  Access Location: Upper arm  Access Type: Arteriovenous fistula  Site Condition No complications  Fistula / Graft Assessment Present;Thrill;Bruit  Status Deaccessed  Needle Size 15    O[facility]en [facility]

## 2024-05-26 NOTE — ED Provider Notes (Signed)
" °  [place] EMERGENCY DEPARTMENT AT [facility] Provider Note   CSN: [id number] Arrival date & time: 05/26/24  9462     Patient presents with: No chief complaint on file.   Paul [name] is a 59 y.o. male.   The history is provided by the patient.   He has history of hypertension, heart failure, end-stage renal disease on hemodialysis and comes to the emergency department because of need for dialysis.  He has no complaints today.    Prior to Admission medications  Medication Sig Start Date End Date Taking? Authorizing Provider  ARIPiprazole  ER (ABILIFY  MAINTENA) 400 MG PRSY prefilled syringe Inject 400 mg into the muscle every 28 (twenty-eight) days. 03/24/24   Plovsky, Elna, MD  carvedilol  (COREG ) 6.25 MG tablet Take 1 tablet (6.25 mg total) by mouth 2 (two) times daily with a meal. 01/13/24   Delbert Clam, MD  isosorbide  mononitrate (IMDUR ) 30 MG 24 hr tablet Take 1 tablet (30 mg total) by mouth daily. 04/28/24   Placey, Ronal Caldron, NP  oseltamivir  (TAMIFLU ) 30 MG capsule Take 1 capsule by mouth after your next two dialysis sessions Patient not taking: Reported on 04/28/2024 04/21/24   Randol Simmonds, MD  OVER THE COUNTER MEDICATION Take 1 capsule by mouth daily. Lion[place] Mane for memory    [provider]  rosuvastatin  (CRESTOR ) 5 MG tablet Take 1 tablet (5 mg total) by mouth daily. 03/24/23     sucroferric oxyhydroxide (VELPHORO ) 500 MG chewable tablet Chew 3 tablets (1,500 mg total) by mouth 3 (three) times daily with meals. Patient not taking: Reported on 04/28/2024 07/29/23   Tawkaliyar, Roya, DO    Allergies: Penicillin g    Review of Systems  All other systems reviewed and are negative.   Updated Vital Signs BP (!) 164/99   Pulse 75   Temp 97.8 F (36.6 C)   Resp 17   SpO2 99%   Physical Exam Vitals and nursing note reviewed.   59 year old male, resting comfortably and in no acute distress. Vital signs are significant for elevated blood  pressure. Oxygen saturation is 99%, which is normal. Head is normocephalic and atraumatic. PERRLA, EOMI.  Lungs are clear without rales, wheezes, or rhonchi. Heart has regular rate and rhythm without murmur. Abdomen is soft, flat, nontender. Extremities: AV fistula present right arm with thrill present. Skin is warm and dry without rash. Neurologic: Awake and alert, moves all extremities equally.   Procedures   Medications Ordered in the ED - No data to display                                  Medical Decision Making  Patient with end-stage renal disease presents for routine dialysis.  I discussed case with Dr. Macel of nephrology service who agrees to take the patient for dialysis.     Final diagnoses:  None    ED Discharge Orders     None          Raford Lenis, MD 05/26/24 [phone]  "

## 2024-05-26 NOTE — Progress Notes (Signed)
 Navigator received a call from pt's behavioral health provider late yesterday afternoon (Dr Tasia). Pt was with provider in the office. Discussed that pt is unable to be accepted at local [facility] clinics due to there is no accepting nephrologist. Explained that navigator is staying in contact with new local [facility] clinic in [place] with plans to submit a referral for that clinic once clinic is able to accept pt's insurance. Navigator continues to follow with plans to attempt referral to [facility] [place] once clinic able to accept pt's insurance.   Paul [name] Dialysis Navigator [phone]

## 2024-05-26 NOTE — ED Triage Notes (Signed)
 Pt came in POV for dialysis treatment. Has not missed any treatments. Wheezing x multiple weeks. Denies any complaints.

## 2024-05-26 NOTE — Telephone Encounter (Signed)
 Noted! Thank you

## 2024-05-26 NOTE — ED Notes (Signed)
 Pt returned from dialysis and ambulated to the lobby; no IV in place; unable to obtain VS prior to discharge

## 2024-05-26 NOTE — Progress Notes (Deleted)
 Asked to see this patient for hospital dialysis. The plan will be for ED HD. Pt is not to be admitted at this time. Pt will go to the dialysis unit when they are ready for the patient. When dialysis is completed pt will be sent back to ED for reassessment.   Vitals:   05/26/24 1256 05/26/24 1411 05/26/24 1418 05/26/24 1432  BP: 136/68  (!) 187/110 (!) 168/102  Pulse: 74  83 77  Resp: 18  20 14   Temp: 97.7 F (36.5 C) (!) 97.5 F (36.4 C) (!) 97.5 F (36.4 C)   TempSrc: Oral Axillary    SpO2: 98%  100% 99%  Weight:   88.8 kg   Height:        Usual HD: 2- 3 x per week  hospital HD  3-3.5h    UF 1.5L  Hep  2500   AVF     I was present at the procedure, reviewed the HD regimen and made appropriate changes.   Myer Fret MD  [facility] 05/26/2024, 2:56 PM    Recent Labs  Lab 05/24/24 0928  HGB 9.5*  CREATININE 13.60*  K 4.5   No results for input(s): IRON , TIBC, FERRITIN in the last 168 hours. Inpatient medications:  Chlorhexidine  Gluconate Cloth  6 each Topical Q0600   heparin   2,500 Units Dialysis Once in dialysis    anticoagulant sodium citrate      alteplase , anticoagulant sodium citrate , heparin , lidocaine  (PF), lidocaine -prilocaine , pentafluoroprop-tetrafluoroeth

## 2024-05-26 NOTE — Procedures (Signed)
 Asked to see this patient for hospital dialysis. The plan will be for ED HD. Pt is not to be admitted at this time. Pt will go to the dialysis unit when they are ready for the patient. When dialysis is completed pt will be sent back to ED for reassessment.   Vitals:   05/26/24 1256 05/26/24 1411 05/26/24 1418 05/26/24 1432  BP: 136/68  (!) 187/110 (!) 168/102  Pulse: 74  83 77  Resp: 18  20 14   Temp: 97.7 F (36.5 C) (!) 97.5 F (36.4 C) (!) 97.5 F (36.4 C)   TempSrc: Oral Axillary    SpO2: 98%  100% 99%  Weight:   88.8 kg   Height:         Usual HD: 2- 3 x per week  hospital HD  3-3.5h    UF 1.5L  Hep  2500   AVF    I was present at the procedure, reviewed the HD regimen and made appropriate changes.   Myer Fret MD  [facility] 05/26/2024, 2:57 PM    Recent Labs  Lab 05/24/24 0928  HGB 9.5*  CREATININE 13.60*  K 4.5   No results for input(s): IRON , TIBC, FERRITIN in the last 168 hours. Inpatient medications:  Chlorhexidine  Gluconate Cloth  6 each Topical Q0600   heparin   2,500 Units Dialysis Once in dialysis    anticoagulant sodium citrate      alteplase , anticoagulant sodium citrate , heparin , lidocaine  (PF), lidocaine -prilocaine , pentafluoroprop-tetrafluoroeth

## 2024-05-26 NOTE — Telephone Encounter (Signed)
 Thanks. Will send Dr. Tasia an inbox message.

## 2024-05-26 NOTE — Telephone Encounter (Signed)
 Copied from CRM #[id number]. Topic: General - Other >> May 25, 2024  4:51 PM   Victoria B wrote:  Reason for CRM: Dr Armando patient's other Dr, wants to speak to Dr Vicci, about patient's psychiatric condition and dialyisis treatment

## 2024-05-28 ENCOUNTER — Emergency Department (HOSPITAL_COMMUNITY): Admission: EM | Admit: 2024-05-28 | Discharge: 2024-05-28 | Disposition: A | Discharge status: Home / Self Care

## 2024-05-28 ENCOUNTER — Encounter (HOSPITAL_COMMUNITY): Payer: Self-pay | Admitting: *Deleted

## 2024-05-28 ENCOUNTER — Other Ambulatory Visit: Payer: Self-pay

## 2024-05-28 DIAGNOSIS — Z5329 Procedure and treatment not carried out because of patient's decision for other reasons: Secondary | ICD-10-CM | POA: Diagnosis not present

## 2024-05-28 DIAGNOSIS — N186 End stage renal disease: Secondary | ICD-10-CM | POA: Diagnosis present

## 2024-05-28 DIAGNOSIS — Z992 Dependence on renal dialysis: Secondary | ICD-10-CM | POA: Insufficient documentation

## 2024-05-28 MED ORDER — CHLORHEXIDINE GLUCONATE CLOTH 2 % EX PADS: 6.0000 | MEDICATED_PAD | Freq: Every day | CUTANEOUS | Status: DC

## 2024-05-28 NOTE — ED Provider Notes (Signed)
 " [place] EMERGENCY DEPARTMENT AT [facility] Provider Note   CSN: [id number] Arrival date & time: 05/28/24  9372     Patient presents with: Vascular Access Problem   Paul [name] is a 59 y.o. male.   59 year old male ESRD on dialysis comes to the emergency department for his dialysis as he does not have outpatient chair.  Last dialysis 1/21.  Reports no complaints.        Prior to Admission medications  Medication Sig Start Date End Date Taking? Authorizing Provider  ARIPiprazole  ER (ABILIFY  MAINTENA) 400 MG PRSY prefilled syringe Inject 400 mg into the muscle every 28 (twenty-eight) days. 03/24/24   Plovsky, Elna, MD  carvedilol  (COREG ) 6.25 MG tablet Take 1 tablet (6.25 mg total) by mouth 2 (two) times daily with a meal. 01/13/24   Delbert Clam, MD  isosorbide  mononitrate (IMDUR ) 30 MG 24 hr tablet Take 1 tablet (30 mg total) by mouth daily. 04/28/24   Placey, Ronal Caldron, NP  oseltamivir  (TAMIFLU ) 30 MG capsule Take 1 capsule by mouth after your next two dialysis sessions Patient not taking: No sig reported 04/21/24   Randol Simmonds, MD  OVER THE COUNTER MEDICATION Take 1 capsule by mouth daily. [employer] for memory    [provider]  rosuvastatin  (CRESTOR ) 5 MG tablet Take 1 tablet (5 mg total) by mouth daily. 03/24/23     sucroferric oxyhydroxide (VELPHORO ) 500 MG chewable tablet Chew 3 tablets (1,500 mg total) by mouth 3 (three) times daily with meals. Patient not taking: No sig reported 07/29/23   Heddy Barren, DO    Allergies: Penicillin g    Review of Systems  Updated Vital Signs BP (!) 144/73   Pulse 81   Temp 98.3 F (36.8 C)   Resp 17   Ht 5' 3 (1.6 m)   Wt 89.1 kg   SpO2 100%   BMI 34.80 kg/m   Physical Exam Vitals and nursing note reviewed.  Constitutional:      General: He is not in acute distress.    Appearance: He is not toxic-appearing.  HENT:     Head: Normocephalic.     Nose: Nose normal.     Mouth/Throat:      Mouth: Mucous membranes are moist.  Eyes:     Conjunctiva/sclera: Conjunctivae normal.  Cardiovascular:     Rate and Rhythm: Normal rate and regular rhythm.  Pulmonary:     Effort: Pulmonary effort is normal. No respiratory distress.     Breath sounds: No wheezing, rhonchi or rales.  Abdominal:     General: Abdomen is flat.  Musculoskeletal:     Right lower leg: No edema.     Left lower leg: No edema.  Skin:    General: Skin is warm.     Capillary Refill: Capillary refill takes less than 2 seconds.  Neurological:     Mental Status: He is alert and oriented to person, place, and time.  Psychiatric:        Mood and Affect: Mood normal.        Behavior: Behavior normal.     (all labs ordered are listed, but only abnormal results are displayed) Labs Reviewed - No data to display  EKG: None  Radiology: No results found.   Procedures   Medications Ordered in the ED  Chlorhexidine  Gluconate Cloth 2 % PADS 6 each (has no administration in time range)    Clinical Course as of 05/28/24 1614  Fri May 28, 2024  1016 Discussed case with nephrology, patient is added to the list.  Unsure when he will actually go for dialysis however as there are panel/census is quite full. [TY]    Clinical Course User Index [TY] [name], DO                                 Medical Decision Making 59 year old male presenting emergency department for dialysis.  Afebrile vital signs reassuring.  Initial blood pressure 210/109, improved to 167/94.  He has no specific complaint currently.  Lungs are clear.  Nephrology consulted for dialysis treatment.  See course for further MDM and final disposition.  Care signed out to afternoon team.  Dispo pending reevaluation after dialysis.      Final diagnoses:  None    ED Discharge Orders     None          [name], DO 05/28/24 1614  "

## 2024-05-28 NOTE — ED Triage Notes (Signed)
Patient is here for his dialysis treatment

## 2024-05-28 NOTE — ED Provider Notes (Signed)
 This patient was not seen by me.  Discharged after dialysis   Cottie Donnice PARAS, MD 05/28/24 [phone]

## 2024-05-28 NOTE — ED Notes (Signed)
 Dialysis unit called and aware patient is here.

## 2024-05-28 NOTE — Progress Notes (Signed)
 Received patient in bed to unit.  Alert and oriented.  Informed consent signed and in chart.   TX duration:3 minutes and 15 minutes  Patient tolerated well.  Transported back to the room  Alert, without acute distress.  Hand-off given to patient's nurse.   Access used: Right upper arm fistula Access issues: none  Total UF removed: 1.5L   05/28/24 1744  Vitals  Temp 97.6 F (36.4 C)  Temp Source Oral  BP (!) 163/86  MAP (mmHg) 106  Pulse Rate 74  ECG Heart Rate 73  Resp 17  Weight 87.6 kg  Type of Weight Pre-Dialysis  Oxygen Therapy  SpO2 98 %  O2 Device Room Air  During Treatment Monitoring  Duration of HD Treatment -hour(s) 3.25 hour(s)  HD Safety Checks Performed Yes  Intra-Hemodialysis Comments Tx completed  Post Treatment  Dialyzer Clearance Lightly streaked  Liters Processed 75.9  Fluid Removed (mL) 1500 mL  Tolerated HD Treatment Yes  AVG/AVF Arterial Site Held (minutes) 7 minutes  AVG/AVF Venous Site Held (minutes) 7 minutes  Fistula / Graft Right Upper arm Arteriovenous fistula  No placement date or time found.   Placed prior to admission: Yes  Orientation: Right  Access Location: Upper arm  Access Type: Arteriovenous fistula  Site Condition No complications  Fistula / Graft Assessment Present;Thrill;Bruit  Status Patent;Deaccessed  Drainage Description None     Camellia Brasil LPN [facility]

## 2024-06-02 ENCOUNTER — Encounter (HOSPITAL_COMMUNITY): Payer: Self-pay

## 2024-06-02 ENCOUNTER — Other Ambulatory Visit: Payer: Self-pay

## 2024-06-02 ENCOUNTER — Emergency Department (HOSPITAL_COMMUNITY)

## 2024-06-02 ENCOUNTER — Emergency Department (HOSPITAL_COMMUNITY): Admission: EM | Admit: 2024-06-02 | Discharge: 2024-06-02 | Disposition: A | Discharge status: Home / Self Care

## 2024-06-02 DIAGNOSIS — K529 Noninfective gastroenteritis and colitis, unspecified: Secondary | ICD-10-CM | POA: Diagnosis not present

## 2024-06-02 DIAGNOSIS — T829XXA Unspecified complication of cardiac and vascular prosthetic device, implant and graft, initial encounter: Secondary | ICD-10-CM | POA: Insufficient documentation

## 2024-06-02 DIAGNOSIS — R0602 Shortness of breath: Secondary | ICD-10-CM | POA: Diagnosis present

## 2024-06-02 LAB — BASIC METABOLIC PANEL WITH GFR
Anion gap: 25 — ABNORMAL HIGH (ref 5–15)
BUN: 86 mg/dL — ABNORMAL HIGH (ref 6–20)
CO2: 16 mmol/L — ABNORMAL LOW (ref 22–32)
Calcium: 7.1 mg/dL — ABNORMAL LOW (ref 8.9–10.3)
Chloride: 99 mmol/L (ref 98–111)
Creatinine, Ser: 14.8 mg/dL — ABNORMAL HIGH (ref 0.61–1.24)
GFR, Estimated: 3 mL/min — ABNORMAL LOW
Glucose, Bld: 93 mg/dL (ref 70–99)
Potassium: 5.2 mmol/L — ABNORMAL HIGH (ref 3.5–5.1)
Sodium: 140 mmol/L (ref 135–145)

## 2024-06-02 LAB — CBC
HCT: 24.8 % — ABNORMAL LOW (ref 39.0–52.0)
Hemoglobin: 8.2 g/dL — ABNORMAL LOW (ref 13.0–17.0)
MCH: 30.1 pg (ref 26.0–34.0)
MCHC: 33.1 g/dL (ref 30.0–36.0)
MCV: 91.2 fL (ref 80.0–100.0)
Platelets: 217 10*3/uL (ref 150–400)
RBC: 2.72 MIL/uL — ABNORMAL LOW (ref 4.22–5.81)
RDW: 17.5 % — ABNORMAL HIGH (ref 11.5–15.5)
WBC: 10.9 10*3/uL — ABNORMAL HIGH (ref 4.0–10.5)
nRBC: 0 % (ref 0.0–0.2)

## 2024-06-02 MED ORDER — LIDOCAINE-PRILOCAINE 2.5-2.5 % EX CREA: 1.0000 | TOPICAL_CREAM | CUTANEOUS | Status: DC | PRN

## 2024-06-02 MED ORDER — LIDOCAINE HCL (PF) 1 % IJ SOLN: 5.0000 mL | INTRAMUSCULAR | Status: DC | PRN

## 2024-06-02 MED ORDER — PENTAFLUOROPROP-TETRAFLUOROETH EX AERO: 1.0000 | INHALATION_SPRAY | CUTANEOUS | Status: DC | PRN

## 2024-06-02 MED ORDER — HEPARIN SODIUM (PORCINE) 1000 UNIT/ML DIALYSIS: 1000.0000 [IU] | INTRAMUSCULAR | Status: DC | PRN

## 2024-06-02 MED ORDER — ANTICOAGULANT SODIUM CITRATE 4% (200MG/5ML) IV SOLN: 5.0000 mL | Status: DC | PRN

## 2024-06-02 MED ORDER — HEPARIN SODIUM (PORCINE) 1000 UNIT/ML DIALYSIS: 1500.0000 [IU] | Freq: Once | INTRAMUSCULAR | Status: DC

## 2024-06-02 MED ORDER — ALTEPLASE 2 MG IJ SOLR: 2.0000 mg | Freq: Once | INTRAMUSCULAR | Status: DC | PRN

## 2024-06-02 MED ORDER — HEPARIN SODIUM (PORCINE) 1000 UNIT/ML DIALYSIS: 2500.0000 [IU] | Freq: Once | INTRAMUSCULAR | Status: DC

## 2024-06-02 MED ORDER — CHLORHEXIDINE GLUCONATE CLOTH 2 % EX PADS: 6.0000 | MEDICATED_PAD | Freq: Every day | CUTANEOUS | Status: DC

## 2024-06-02 NOTE — ED Provider Notes (Signed)
 " [place] EMERGENCY DEPARTMENT AT [facility] Provider Note   CSN: [id number] Arrival date & time: 06/02/24  9358     Patient presents with: Respiratory Distress   Paul [name] is a 59 y.o. male.   HPI    Patient states he missed dialysis on Monday because of the weather.  Subsequently some short shortness of breath.  No fever no chills.  No chest pain.  Patient states he has chronic diarrhea.  No bright red blood per rectum.  No dark stools.  No obvious sick contacts he is aware of.  No falls.  Otherwise denies all complaints.   Previous medical history reviewed : Patient last seen in ED in January 2026.  Seen because of vascular access problem.  Really does need to dialysis.   Prior to Admission medications  Medication Sig Start Date End Date Taking? Authorizing Provider  ARIPiprazole  ER (ABILIFY  MAINTENA) 400 MG PRSY prefilled syringe Inject 400 mg into the muscle every 28 (twenty-eight) days. 03/24/24   Plovsky, Elna, MD  carvedilol  (COREG ) 6.25 MG tablet Take 1 tablet (6.25 mg total) by mouth 2 (two) times daily with a meal. 01/13/24   Delbert Clam, MD  isosorbide  mononitrate (IMDUR ) 30 MG 24 hr tablet Take 1 tablet (30 mg total) by mouth daily. 04/28/24   Placey, Ronal Caldron, NP  oseltamivir  (TAMIFLU ) 30 MG capsule Take 1 capsule by mouth after your next two dialysis sessions Patient not taking: No sig reported 04/21/24   Randol Simmonds, MD  OVER THE COUNTER MEDICATION Take 1 capsule by mouth daily. [place] for memory    [provider]  rosuvastatin  (CRESTOR ) 5 MG tablet Take 1 tablet (5 mg total) by mouth daily. 03/24/23     sucroferric oxyhydroxide (VELPHORO ) 500 MG chewable tablet Chew 3 tablets (1,500 mg total) by mouth 3 (three) times daily with meals. Patient not taking: No sig reported 07/29/23   Tawkaliyar, Roya, DO    Allergies: Penicillin g    Review of Systems  Constitutional:  Negative for chills and fever.  HENT:  Negative for ear  pain and sore throat.   Eyes:  Negative for pain and visual disturbance.  Respiratory:  Negative for cough and shortness of breath.   Cardiovascular:  Negative for chest pain and palpitations.  Gastrointestinal:  Negative for abdominal pain and vomiting.  Genitourinary:  Negative for dysuria and hematuria.  Musculoskeletal:  Negative for arthralgias and back pain.  Skin:  Negative for color change and rash.  Neurological:  Negative for seizures and syncope.  All other systems reviewed and are negative.   Updated Vital Signs BP (!) 180/118   Pulse 81   Temp 98 F (36.7 C) (Oral)   Resp 17   Ht 5' 3 (1.6 m)   Wt 89.7 kg   SpO2 97%   BMI 35.03 kg/m   Physical Exam Vitals and nursing note reviewed.  Constitutional:      General: He is not in acute distress.    Appearance: He is well-developed.  HENT:     Head: Normocephalic and atraumatic.  Eyes:     Conjunctiva/sclera: Conjunctivae normal.  Cardiovascular:     Rate and Rhythm: Normal rate and regular rhythm.     Heart sounds: No murmur heard. Pulmonary:     Effort: Pulmonary effort is normal. No respiratory distress.     Breath sounds: Normal breath sounds.  Abdominal:     Palpations: Abdomen is soft.     Tenderness:  There is no abdominal tenderness.  Musculoskeletal:        General: No swelling.     Cervical back: Neck supple.  Skin:    General: Skin is warm and dry.     Capillary Refill: Capillary refill takes less than 2 seconds.  Neurological:     Mental Status: He is alert.  Psychiatric:        Mood and Affect: Mood normal.     (all labs ordered are listed, but only abnormal results are displayed) Labs Reviewed  BASIC METABOLIC PANEL WITH GFR - Abnormal; Notable for the following components:      Result Value   Potassium 5.2 (*)    CO2 16 (*)    BUN 86 (*)    Creatinine, Ser 14.80 (*)    Calcium  7.1 (*)    GFR, Estimated 3 (*)    Anion gap 25 (*)    All other components within normal limits  CBC -  Abnormal; Notable for the following components:   WBC 10.9 (*)    RBC 2.72 (*)    Hemoglobin 8.2 (*)    HCT 24.8 (*)    RDW 17.5 (*)    All other components within normal limits  HEPATITIS B SURFACE ANTIGEN  HEPATITIS B SURFACE ANTIBODY, QUANTITATIVE    EKG: EKG Interpretation Date/Time:  Wednesday June 02 2024 07:41:42 EST Ventricular Rate:  77 PR Interval:  188 QRS Duration:  92 QT Interval:  450 QTC Calculation: 509 R Axis:   -8  Text Interpretation: Normal sinus rhythm Septal infarct , age undetermined Prolonged QT Abnormal ECG When compared with ECG of 30-Apr-2024 06:54, PREVIOUS ECG IS PRESENT Confirmed by Simon Rea [phone]) on 06/02/2024 7:53:51 AM  Radiology: [facility] Chest 2 View Result Date: 06/02/2024 EXAM: 2 VIEW(S) XRAY OF THE CHEST 06/02/2024 07:15:00 AM COMPARISON: 04/21/2024 CLINICAL HISTORY: Shortness of breath. FINDINGS: LINES, TUBES AND DEVICES: Right subclavian stent stable in place. LUNGS AND PLEURA: Cephalization and blood flow favoring pulmonary venous hypertension but without overt edema. No focal pulmonary opacity. No pleural effusion. No pneumothorax. HEART AND MEDIASTINUM: Mild cardiomegaly. BONES AND SOFT TISSUES: Moderate thoracic spondylosis. IMPRESSION: 1. Mild cardiomegaly with cephalization and blood flow favoring pulmonary venous hypertension, without overt edema. 2. Right subclavian stent in place. 3. Moderate thoracic spondylosis. Electronically signed by: Ryan Salvage MD 06/02/2024 07:29 AM EST RP Workstation: [id number]     Procedures   Medications Ordered in the ED  Chlorhexidine  Gluconate Cloth 2 % PADS 6 each (0 each Topical Hold 06/02/24 0837)  pentafluoroprop-tetrafluoroeth (GEBAUERS) aerosol 1 Application (has no administration in time range)  lidocaine  (PF) (XYLOCAINE ) 1 % injection 5 mL (has no administration in time range)  lidocaine -prilocaine  (EMLA ) cream 1 Application (has no administration in time range)  heparin  injection 1,000  Units (has no administration in time range)  anticoagulant sodium citrate  solution 5 mL (has no administration in time range)  alteplase  (CATHFLO ACTIVASE ) injection 2 mg (has no administration in time range)  heparin  injection 1,500 Units (has no administration in time range)  heparin  injection 2,500 Units (has no administration in time range)                                    Medical Decision Making Amount and/or Complexity of Data Reviewed Labs: ordered. Radiology: ordered.     HPI:     Patient states he missed dialysis on Monday because of  the weather.  Subsequently some short shortness of breath.  No fever no chills.  No chest pain.  Patient states he has chronic diarrhea.  No bright red blood per rectum.  No dark stools.  No obvious sick contacts he is aware of.  No falls.  Otherwise denies all complaints.   Previous medical history reviewed : Patient last seen in ED in January 2026.  Seen because of vascular access problem.  Really does need to dialysis.  MDM:   Upon examination, patient hemodynamically stable. A&O x 3 with GCS 15.  O2 saturation 89%.  On arrival in the room, O2 saturation 99% on room air.  He has some very minimal expiratory wheezing.  No increased work of breathing whenever I was in the room.   This is likely because of missed dialysis.  No concerns any kind of cardiac cause at this point time.  Will obtain chest x-ray.  Rule out pneumonia.  Basic labs.  Electrolyte workup.  EKG.  Denies all chest pain.  No concerns for ACS component.   Reevaluation:   Upon reexamination, patient hemodynamically stable.  Remains A&O x 3 with GCS 15.   Patient's hemoglobin 8.2.  His hemoglobin varies between 8.2 and 9.8.  No bright red blood per rectum.  No melena stool.  Likely chronic anemia in setting of CKD.  No concerns for GI bleed at this time.  Denies all abdominal pain or any kind of signs or symptoms of GI bleed.  Blood blood cell count is minimally  elevated.  No evidence of pneumonia on chest x-ray.  O2 100% on room air.  Afebrile.  No obvious infectious etiology at this time.  Given strict return precautions for any kind of fever chills concerning for pneumonia otherwise no clear source of small leukocytosis.  No abdominal pain.  Chronic diarrhea but soft and benign abdomen.  No rebound guarding or tenderness.  Will need to be monitored outpatient.  Somewhat acidotic as well as uremic in the setting of missed dialysis.  Spoke to nephrology.  Appreciate their help.  Patient will receive dialysis here the hospital.  Plan is for dialysis and subsequently discharged home as long as hemodynamically stable.       Final diagnoses:  Dialysis complication, initial encounter    ED Discharge Orders     None          Simon Lavonia SAILOR, MD 06/02/24 1639  "

## 2024-06-02 NOTE — Progress Notes (Signed)
" °   06/02/24 1712  Vitals  Temp 97.8 F (36.6 C)  Temp Source Oral  BP (!) 178/97  BP Location Left Arm  BP Method Automatic  Patient Position (if appropriate) Lying  Pulse Rate 84  ECG Heart Rate 76  Resp 17  Oxygen Therapy  SpO2 95 %  O2 Device Room Air  Patient Activity (if Appropriate) In bed  Pulse Oximetry Type Continuous  Oximetry Probe Site Changed Yes  During Treatment Monitoring  HD Safety Checks Performed Yes  Intra-Hemodialysis Comments Tolerated well;Tx completed  Post Treatment  Dialyzer Clearance Clear  Liters Processed 82.5  Fluid Removed (mL) 2000 mL  Tolerated HD Treatment Yes  AVG/AVF Arterial Site Held (minutes) 10 minutes  AVG/AVF Venous Site Held (minutes) 10 minutes  Fistula / Graft Right Upper arm Arteriovenous fistula  No placement date or time found.   Placed prior to admission: Yes  Orientation: Right  Access Location: Upper arm  Access Type: Arteriovenous fistula  Site Condition No complications  Fistula / Graft Assessment Present;Thrill;Bruit  Status Deaccessed  Drainage Description None    "

## 2024-06-02 NOTE — ED Triage Notes (Signed)
 Pt coming in reporting URI symptoms and reporting needing dialysis. Pt is has wheezes bilaterally.

## 2024-06-02 NOTE — ED Notes (Signed)
 Pt missed Monday dialysis

## 2024-06-02 NOTE — ED Notes (Signed)
 Patient not in room

## 2024-06-02 NOTE — ED Notes (Signed)
 Pt seen walking down the hall with steady gait. Pt did not wait on d/c vitals.

## 2024-06-02 NOTE — ED Provider Notes (Signed)
 Patient reportedly returned from dialysis and was feeling well and walked out of the emergency department after completion.  Plan was for likely discharge if he completed dialysis which he did.  Will flip over to discharge.   Raeonna Milo, Lonni PARAS, MD 06/02/24 [phone]

## 2024-06-04 ENCOUNTER — Encounter (HOSPITAL_COMMUNITY): Payer: Self-pay

## 2024-06-04 ENCOUNTER — Other Ambulatory Visit: Payer: Self-pay

## 2024-06-04 ENCOUNTER — Emergency Department (HOSPITAL_COMMUNITY)
Admission: EM | Admit: 2024-06-04 | Discharge: 2024-06-04 | Disposition: A | Discharge status: Home / Self Care | Attending: Emergency Medicine | Admitting: Emergency Medicine

## 2024-06-04 DIAGNOSIS — I509 Heart failure, unspecified: Secondary | ICD-10-CM | POA: Insufficient documentation

## 2024-06-04 DIAGNOSIS — R062 Wheezing: Secondary | ICD-10-CM | POA: Diagnosis present

## 2024-06-04 DIAGNOSIS — N186 End stage renal disease: Secondary | ICD-10-CM | POA: Diagnosis not present

## 2024-06-04 DIAGNOSIS — E8770 Fluid overload, unspecified: Secondary | ICD-10-CM | POA: Diagnosis not present

## 2024-06-04 DIAGNOSIS — Z992 Dependence on renal dialysis: Secondary | ICD-10-CM | POA: Diagnosis not present

## 2024-06-04 MED ORDER — ANTICOAGULANT SODIUM CITRATE 4% (200MG/5ML) IV SOLN: 5.0000 mL | Status: DC | PRN

## 2024-06-04 MED ORDER — HEPARIN SODIUM (PORCINE) 1000 UNIT/ML DIALYSIS
2500.0000 [IU] | Freq: Once | INTRAMUSCULAR | Status: AC
  Administered 2024-06-04: 2500 [IU] via INTRAVENOUS_CENTRAL

## 2024-06-04 MED ORDER — ALTEPLASE 2 MG IJ SOLR: 2.0000 mg | Freq: Once | INTRAMUSCULAR | Status: DC | PRN

## 2024-06-04 MED ORDER — LIDOCAINE HCL (PF) 1 % IJ SOLN: 5.0000 mL | INTRAMUSCULAR | Status: DC | PRN

## 2024-06-04 MED ORDER — LIDOCAINE-PRILOCAINE 2.5-2.5 % EX CREA: 1.0000 | TOPICAL_CREAM | CUTANEOUS | Status: DC | PRN

## 2024-06-04 MED ORDER — CHLORHEXIDINE GLUCONATE CLOTH 2 % EX PADS: 6.0000 | MEDICATED_PAD | Freq: Every day | CUTANEOUS | Status: DC

## 2024-06-04 MED ORDER — PENTAFLUOROPROP-TETRAFLUOROETH EX AERO: 1.0000 | INHALATION_SPRAY | CUTANEOUS | Status: DC | PRN

## 2024-06-04 MED ORDER — HEPARIN SODIUM (PORCINE) 1000 UNIT/ML IJ SOLN
INTRAMUSCULAR | Status: AC
  Filled 2024-06-04: qty 3

## 2024-06-04 MED ORDER — HEPARIN SODIUM (PORCINE) 1000 UNIT/ML DIALYSIS: 1000.0000 [IU] | INTRAMUSCULAR | Status: DC | PRN

## 2024-06-04 NOTE — ED Notes (Signed)
Transported to Dialysis

## 2024-06-04 NOTE — Progress Notes (Signed)
" °   06/04/24 1800  Vitals  Temp 98.7 F (37.1 C)  Temp Source Oral  BP (!) 156/96  MAP (mmHg) 110  Pulse Rate 76  ECG Heart Rate 77  Oxygen Therapy  SpO2 99 %  During Treatment Monitoring  Blood Flow Rate (mL/min) 0 mL/min  Arterial Pressure (mmHg) 18.58 mmHg  Venous Pressure (mmHg) -26.66 mmHg  TMP (mmHg) 23.43 mmHg  Ultrafiltration Rate (mL/min) 660 mL/min  Dialysate Flow Rate (mL/min) 299 ml/min  Duration of HD Treatment -hour(s) 3.5 hour(s)  Cumulative Fluid Removed (mL) per Treatment  2000.1  Post Treatment  Dialyzer Clearance Clear  Hemodialysis Intake (mL) 0 mL  Liters Processed 83.9  Fluid Removed (mL) 2000 mL  Tolerated HD Treatment Yes  AVG/AVF Arterial Site Held (minutes) 10 minutes  AVG/AVF Venous Site Held (minutes) 10 minutes  Fistula / Graft Right Upper arm Arteriovenous fistula  No placement date or time found.   Placed prior to admission: Yes  Orientation: Right  Access Location: Upper arm  Access Type: Arteriovenous fistula  Site Condition No complications  Fistula / Graft Assessment Present;Thrill;Bruit  Status Deaccessed  Drainage Description None    "

## 2024-06-04 NOTE — ED Provider Notes (Signed)
 " [place] EMERGENCY DEPARTMENT AT [facility] Provider Note   CSN: [id number] Arrival date & time: 06/04/24  9362     Patient presents with: Needs Dialysis   Paul [name] is a 59 y.o. male.   HPI    59 year old male comes in with chief complaint of needing dialysis.  Patient's last hemodialysis was on Wednesday.  Currently he has no shortness of breath, palpitations.  He does indicate that he feels slightly wheezing, but does not want x-rays.  Prior to Admission medications  Medication Sig Start Date End Date Taking? Authorizing Provider  ARIPiprazole  ER (ABILIFY  MAINTENA) 400 MG PRSY prefilled syringe Inject 400 mg into the muscle every 28 (twenty-eight) days. 03/24/24   Plovsky, Elna, MD  carvedilol  (COREG ) 6.25 MG tablet Take 1 tablet (6.25 mg total) by mouth 2 (two) times daily with a meal. 01/13/24   Delbert Clam, MD  isosorbide  mononitrate (IMDUR ) 30 MG 24 hr tablet Take 1 tablet (30 mg total) by mouth daily. 04/28/24   Placey, Ronal Caldron, NP  oseltamivir  (TAMIFLU ) 30 MG capsule Take 1 capsule by mouth after your next two dialysis sessions Patient not taking: No sig reported 04/21/24   Randol Simmonds, MD  OVER THE COUNTER MEDICATION Take 1 capsule by mouth daily. [place] for memory    [provider]  rosuvastatin  (CRESTOR ) 5 MG tablet Take 1 tablet (5 mg total) by mouth daily. 03/24/23     sucroferric oxyhydroxide (VELPHORO ) 500 MG chewable tablet Chew 3 tablets (1,500 mg total) by mouth 3 (three) times daily with meals. Patient not taking: No sig reported 07/29/23   Heddy Barren, DO    Allergies: Penicillin g    Review of Systems  All other systems reviewed and are negative.   Updated Vital Signs BP (!) 189/107 (BP Location: Left Arm)   Pulse 78   Temp 97.7 F (36.5 C) (Oral)   Resp 19   Ht 5' 3 (1.6 m)   Wt 87.7 kg   SpO2 100%   BMI 34.25 kg/m   Physical Exam Vitals and nursing note reviewed.  Constitutional:      Appearance:  He is well-developed.  HENT:     Head: Atraumatic.  Cardiovascular:     Rate and Rhythm: Normal rate.  Pulmonary:     Effort: Pulmonary effort is normal.     Breath sounds: Rales present.  Musculoskeletal:     Cervical back: Neck supple.  Skin:    General: Skin is warm.  Neurological:     Mental Status: He is alert and oriented to person, place, and time.     (all labs ordered are listed, but only abnormal results are displayed) Labs Reviewed  HEPATITIS B SURFACE ANTIGEN  HEPATITIS B SURFACE ANTIBODY, QUANTITATIVE    EKG: None  Radiology: No results found.   Procedures   Medications Ordered in the ED  Chlorhexidine  Gluconate Cloth 2 % PADS 6 each (has no administration in time range)                                    Medical Decision Making  59 year old male with history of ESRD, CHF comes in with chief complaint of missed dialysis.  His last dialysis was just 2 days ago.  He is here for routine dialysis.  May be slightly volume overloaded, as he reports mild wheezing.  He has no respiratory illness.  There  is some bibasilar rales.  Patient has no hypoxia.  I have consulted Dr. Melia, nephrology.  Final diagnoses:  Hypervolemia, unspecified hypervolemia type  ESRD (end stage renal disease) on dialysis [facility])    ED Discharge Orders     None          Charlyn Sora, MD 06/04/24 [phone]  "

## 2024-06-04 NOTE — ED Provider Notes (Signed)
 Patient left prior to being reevaluated after returning from dialysis.   Freddi Hamilton, MD 06/04/24 [phone]

## 2024-06-04 NOTE — ED Notes (Signed)
 Reports had dialysis on Wednesday no symptonms.  Reports feeling fine

## 2024-06-04 NOTE — ED Triage Notes (Signed)
 Pt is coming in for dialysis today

## 2024-06-04 NOTE — ED Notes (Signed)
 Nephro saw and placing dialysis orders

## 2024-06-07 ENCOUNTER — Emergency Department (HOSPITAL_COMMUNITY)
Admission: EM | Admit: 2024-06-07 | Discharge: 2024-06-07 | Disposition: A | Attending: Emergency Medicine | Admitting: Emergency Medicine

## 2024-06-07 ENCOUNTER — Telehealth: Payer: Self-pay | Admitting: Internal Medicine

## 2024-06-07 ENCOUNTER — Encounter (HOSPITAL_COMMUNITY): Payer: Self-pay | Admitting: Emergency Medicine

## 2024-06-07 ENCOUNTER — Other Ambulatory Visit: Payer: Self-pay

## 2024-06-07 DIAGNOSIS — I120 Hypertensive chronic kidney disease with stage 5 chronic kidney disease or end stage renal disease: Secondary | ICD-10-CM | POA: Insufficient documentation

## 2024-06-07 DIAGNOSIS — Z992 Dependence on renal dialysis: Secondary | ICD-10-CM | POA: Insufficient documentation

## 2024-06-07 DIAGNOSIS — Z79899 Other long term (current) drug therapy: Secondary | ICD-10-CM | POA: Insufficient documentation

## 2024-06-07 DIAGNOSIS — N186 End stage renal disease: Secondary | ICD-10-CM | POA: Insufficient documentation

## 2024-06-07 LAB — HEPATITIS B SURFACE ANTIGEN: Hepatitis B Surface Ag: NONREACTIVE

## 2024-06-07 MED ORDER — HEPARIN SODIUM (PORCINE) 1000 UNIT/ML DIALYSIS: 1000.0000 [IU] | INTRAMUSCULAR | Status: DC | PRN

## 2024-06-07 MED ORDER — ANTICOAGULANT SODIUM CITRATE 4% (200MG/5ML) IV SOLN
5.0000 mL | Status: DC | PRN
  Filled 2024-06-07: qty 5

## 2024-06-07 MED ORDER — CARVEDILOL 6.25 MG PO TABS
6.2500 mg | ORAL_TABLET | Freq: Two times a day (BID) | ORAL | Status: DC
  Administered 2024-06-07: 6.25 mg via ORAL
  Filled 2024-06-07: qty 1

## 2024-06-07 MED ORDER — ALTEPLASE 2 MG IJ SOLR: 2.0000 mg | Freq: Once | INTRAMUSCULAR | Status: DC | PRN

## 2024-06-07 MED ORDER — LIDOCAINE HCL (PF) 1 % IJ SOLN: 5.0000 mL | INTRAMUSCULAR | Status: DC | PRN

## 2024-06-07 MED ORDER — CHLORHEXIDINE GLUCONATE CLOTH 2 % EX PADS: 6.0000 | MEDICATED_PAD | Freq: Every day | CUTANEOUS | Status: DC

## 2024-06-07 MED ORDER — HEPARIN SODIUM (PORCINE) 1000 UNIT/ML DIALYSIS
2400.0000 [IU] | Freq: Once | INTRAMUSCULAR | Status: AC
  Administered 2024-06-07: 2400 [IU] via INTRAVENOUS_CENTRAL

## 2024-06-07 MED ORDER — LIDOCAINE-PRILOCAINE 2.5-2.5 % EX CREA: 1.0000 | TOPICAL_CREAM | CUTANEOUS | Status: DC | PRN

## 2024-06-07 MED ORDER — ALBUTEROL SULFATE HFA 108 (90 BASE) MCG/ACT IN AERS
2.0000 | INHALATION_SPRAY | Freq: Once | RESPIRATORY_TRACT | Status: AC
  Administered 2024-06-07: 2 via RESPIRATORY_TRACT
  Filled 2024-06-07: qty 6.7

## 2024-06-07 MED ORDER — PENTAFLUOROPROP-TETRAFLUOROETH EX AERO: 1.0000 | INHALATION_SPRAY | CUTANEOUS | Status: DC | PRN

## 2024-06-07 NOTE — Progress Notes (Signed)
" °   06/07/24 1724  Vitals  Temp 97.9 F (36.6 C)  Pulse Rate 80  Resp 15  BP (!) 177/86  SpO2 98 %  O2 Device Room Air  Weight 87.4 kg  Type of Weight Post-Dialysis  Oxygen Therapy  Patient Activity (if Appropriate) In bed  Pulse Oximetry Type Continuous  Oximetry Probe Site Changed No  Post Treatment  Dialyzer Clearance Lightly streaked  Hemodialysis Intake (mL) 0 mL  Liters Processed 84  Fluid Removed (mL) 1500 mL  Tolerated HD Treatment Yes  AVG/AVF Arterial Site Held (minutes) 15 minutes  AVG/AVF Venous Site Held (minutes) 15 minutes   Received patient in bed to unit.  Alert and oriented.  Informed consent signed and in chart.   TX duration:3.0  Patient tolerated well.  Transported back to the room  Alert, without acute distress.  Hand-off given to patient's nurse.   Access used: RUAF Access issues: increased pressures to both access sites---Dr. Geralynn is aware  Total UF removed: 1500 Medication(s) given: Coreg  6.25mg  po x1   Delon LITTIE Engel [facility] "

## 2024-06-07 NOTE — ED Triage Notes (Signed)
Pt here for dialysis. No complaints.

## 2024-06-07 NOTE — Telephone Encounter (Signed)
 Contacted pt resch appt!

## 2024-06-08 ENCOUNTER — Ambulatory Visit: Admitting: Internal Medicine

## 2024-06-08 ENCOUNTER — Other Ambulatory Visit: Payer: Self-pay

## 2024-06-08 LAB — HEPATITIS B SURFACE ANTIBODY, QUANTITATIVE: Hep B S AB Quant (Post): 5731 m[IU]/mL

## 2024-06-08 NOTE — Progress Notes (Signed)
 Specialty Pharmacy Refill Coordination Note  Paul [name] is a 59 y.o. male assessed today regarding refills of clinic administered specialty medication(s) ARIPiprazole  (ABILIFY  MAINTENA)   Clinic requested Courier to Provider Office   Delivery date: 06/17/24   Verified address: [facility] [place]   Medication will be filled on: 06/16/24  Appointment 06/22/24.

## 2024-06-09 ENCOUNTER — Emergency Department (HOSPITAL_COMMUNITY)
Admission: EM | Admit: 2024-06-09 | Discharge: 2024-06-09 | Disposition: A | Discharge status: Home / Self Care | Source: Home / Self Care

## 2024-06-09 ENCOUNTER — Other Ambulatory Visit: Payer: Self-pay

## 2024-06-09 DIAGNOSIS — Z992 Dependence on renal dialysis: Secondary | ICD-10-CM

## 2024-06-09 LAB — BASIC METABOLIC PANEL WITH GFR
Anion gap: 19 — ABNORMAL HIGH (ref 5–15)
BUN: 59 mg/dL — ABNORMAL HIGH (ref 6–20)
CO2: 23 mmol/L (ref 22–32)
Calcium: 7.1 mg/dL — ABNORMAL LOW (ref 8.9–10.3)
Chloride: 100 mmol/L (ref 98–111)
Creatinine, Ser: 9.87 mg/dL — ABNORMAL HIGH (ref 0.61–1.24)
GFR, Estimated: 6 mL/min — ABNORMAL LOW
Glucose, Bld: 82 mg/dL (ref 70–99)
Potassium: 5 mmol/L (ref 3.5–5.1)
Sodium: 141 mmol/L (ref 135–145)

## 2024-06-09 LAB — CBC WITH DIFFERENTIAL/PLATELET
Abs Immature Granulocytes: 0.03 10*3/uL (ref 0.00–0.07)
Basophils Absolute: 0 10*3/uL (ref 0.0–0.1)
Basophils Relative: 1 %
Eosinophils Absolute: 0.2 10*3/uL (ref 0.0–0.5)
Eosinophils Relative: 3 %
HCT: 26.4 % — ABNORMAL LOW (ref 39.0–52.0)
Hemoglobin: 8.6 g/dL — ABNORMAL LOW (ref 13.0–17.0)
Immature Granulocytes: 0 %
Lymphocytes Relative: 16 %
Lymphs Abs: 1.2 10*3/uL (ref 0.7–4.0)
MCH: 30.1 pg (ref 26.0–34.0)
MCHC: 32.6 g/dL (ref 30.0–36.0)
MCV: 92.3 fL (ref 80.0–100.0)
Monocytes Absolute: 0.8 10*3/uL (ref 0.1–1.0)
Monocytes Relative: 10 %
Neutro Abs: 5.5 10*3/uL (ref 1.7–7.7)
Neutrophils Relative %: 70 %
Platelets: 259 10*3/uL (ref 150–400)
RBC: 2.86 MIL/uL — ABNORMAL LOW (ref 4.22–5.81)
RDW: 17.2 % — ABNORMAL HIGH (ref 11.5–15.5)
WBC: 7.7 10*3/uL (ref 4.0–10.5)
nRBC: 0 % (ref 0.0–0.2)

## 2024-06-09 MED ORDER — PENTAFLUOROPROP-TETRAFLUOROETH EX AERO: 1.0000 | INHALATION_SPRAY | CUTANEOUS | Status: DC | PRN

## 2024-06-09 MED ORDER — ALTEPLASE 2 MG IJ SOLR: 2.0000 mg | Freq: Once | INTRAMUSCULAR | Status: DC | PRN

## 2024-06-09 MED ORDER — ANTICOAGULANT SODIUM CITRATE 4% (200MG/5ML) IV SOLN
5.0000 mL | Status: DC | PRN
  Filled 2024-06-09: qty 5

## 2024-06-09 MED ORDER — HEPARIN SODIUM (PORCINE) 1000 UNIT/ML DIALYSIS
2400.0000 [IU] | Freq: Once | INTRAMUSCULAR | Status: AC
  Administered 2024-06-09: 2400 [IU] via INTRAVENOUS_CENTRAL

## 2024-06-09 MED ORDER — LIDOCAINE HCL (PF) 1 % IJ SOLN: 5.0000 mL | INTRAMUSCULAR | Status: DC | PRN

## 2024-06-09 MED ORDER — HEPARIN SODIUM (PORCINE) 1000 UNIT/ML DIALYSIS: 1000.0000 [IU] | INTRAMUSCULAR | Status: DC | PRN

## 2024-06-09 MED ORDER — CHLORHEXIDINE GLUCONATE CLOTH 2 % EX PADS: 6.0000 | MEDICATED_PAD | Freq: Every day | CUTANEOUS | Status: DC

## 2024-06-09 MED ORDER — LIDOCAINE-PRILOCAINE 2.5-2.5 % EX CREA: 1.0000 | TOPICAL_CREAM | CUTANEOUS | Status: DC | PRN

## 2024-06-09 NOTE — Discharge Instructions (Signed)
 Return if any problems.

## 2024-06-09 NOTE — ED Provider Notes (Cosign Needed Addendum)
 Patient's care assumed at 3:30 PM.  Patient is waiting for dialysis. Physical Exam  BP (!) 160/102   Pulse 79   Temp 97.7 F (36.5 C)   Resp 16   Wt 88.5 kg   SpO2 99%   BMI 34.56 kg/m   Physical Exam  Procedures  Procedures  ED Course / MDM    Medical Decision Making  Patient taken to dialysis.  Patient returned during my shift.  Patient states he feels better he wants to go home.  He denies any complaints.  Patient is discharged in stable condition      Xzavien Harada K, PA-C 06/09/24 1633    Patsey Lot, MD 06/09/24 2106    Flint Sonny POUR, PA-C 06/09/24 2130    Patsey Lot, MD 06/10/24 1452

## 2024-06-09 NOTE — Progress Notes (Signed)
" °   06/09/24 1853  Vital Signs  Pulse Rate 78  Resp 15  BP (!) 197/104  Oxygen Therapy  SpO2 98 %  During Treatment Monitoring  Blood Flow Rate (mL/min) 399 mL/min  Arterial Pressure (mmHg) -112.52 mmHg  Venous Pressure (mmHg) 300.59 mmHg  TMP (mmHg) 12.32 mmHg  Ultrafiltration Rate (mL/min) 633 mL/min  Dialysate Flow Rate (mL/min) 299 ml/min  Duration of HD Treatment -hour(s) 2.09 hour(s)  Cumulative Fluid Removed (mL) per Treatment  864.86  Note  Patient Observations Repoted off to [place], CN taking over pt care.    "

## 2024-06-09 NOTE — Procedures (Signed)
 Asked to see this patient for hospital dialysis. The plan will be for ED HD. Pt is not to be admitted at this time. Pt will go to the dialysis unit when they are ready for the patient. When dialysis is completed pt will be sent back to ED for reassessment.   Vitals:   06/09/24 1629 06/09/24 1645 06/09/24 1649 06/09/24 1700  BP: (!) 170/99 (!) 170/99    Pulse:  82 80 84  Resp:  19 16 15   Temp: (!) 97.5 F (36.4 C)     TempSrc: Oral     SpO2: 98% 100% 100% 100%  Weight: 88.5 kg          I was present at the procedure, reviewed the HD regimen and made appropriate changes.   Myer Fret MD  [facility] 06/09/2024, 5:12 PM    Recent Labs  Lab 06/09/24 0920  HGB 8.6*  CALCIUM  7.1*  CREATININE 9.87*  K 5.0   No results for input(s): IRON , TIBC, FERRITIN in the last 168 hours. Inpatient medications:  [START ON 06/10/2024] Chlorhexidine  Gluconate Cloth  6 each Topical Q0600    anticoagulant sodium citrate      alteplase , anticoagulant sodium citrate , heparin , lidocaine  (PF), lidocaine -prilocaine , pentafluoroprop-tetrafluoroeth

## 2024-06-09 NOTE — ED Triage Notes (Signed)
 Patient is here for hemodialysis treatment . No other complaints .

## 2024-06-09 NOTE — ED Provider Notes (Cosign Needed)
 " [place] EMERGENCY DEPARTMENT AT [facility] Provider Note   CSN: [id number] Arrival date & time: 06/09/24  9362     Patient presents with: Hemodialysis   Paul [name] is a 59 y.o. male.   59 y.o male with PMH of HTN, Central retinal artery occlusion presents to the ED with a chief complaint of needing dialysis.  According to records patient's last dialysis was 2 days ago.  He is telling me that he is having some symptoms such as wheezing.  He denies any other symptoms at this time.  The history is provided by the patient.       Prior to Admission medications  Medication Sig Start Date End Date Taking? Authorizing Provider  ARIPiprazole  ER (ABILIFY  MAINTENA) 400 MG PRSY prefilled syringe Inject 400 mg into the muscle every 28 (twenty-eight) days. 03/24/24   Plovsky, Elna, MD  carvedilol  (COREG ) 6.25 MG tablet Take 1 tablet (6.25 mg total) by mouth 2 (two) times daily with a meal. 01/13/24   Delbert Clam, MD  isosorbide  mononitrate (IMDUR ) 30 MG 24 hr tablet Take 1 tablet (30 mg total) by mouth daily. 04/28/24   Placey, Ronal Caldron, NP  oseltamivir  (TAMIFLU ) 30 MG capsule Take 1 capsule by mouth after your next two dialysis sessions Patient not taking: No sig reported 04/21/24   Randol Simmonds, MD  OVER THE COUNTER MEDICATION Take 1 capsule by mouth daily. [place] for memory    [provider]  rosuvastatin  (CRESTOR ) 5 MG tablet Take 1 tablet (5 mg total) by mouth daily. 03/24/23     sucroferric oxyhydroxide (VELPHORO ) 500 MG chewable tablet Chew 3 tablets (1,500 mg total) by mouth 3 (three) times daily with meals. Patient not taking: No sig reported 07/29/23   Tawkaliyar, Roya, DO    Allergies: Penicillin g    Review of Systems  Constitutional:  Negative for fever.  Respiratory:  Positive for shortness of breath.   Cardiovascular:  Negative for chest pain.  Gastrointestinal:  Negative for abdominal pain, nausea and vomiting.  Genitourinary:  Negative  for flank pain.  Musculoskeletal:  Negative for back pain.  All other systems reviewed and are negative.   Updated Vital Signs BP (!) 166/98 (BP Location: Left Arm)   Pulse 76   Temp 97.7 F (36.5 C)   Resp 18   SpO2 97%   Physical Exam Vitals and nursing note reviewed.  Constitutional:      Appearance: He is well-developed.  HENT:     Head: Normocephalic and atraumatic.  Eyes:     General: No scleral icterus.    Pupils: Pupils are equal, round, and reactive to light.  Cardiovascular:     Heart sounds: Normal heart sounds.  Pulmonary:     Effort: Pulmonary effort is normal.     Breath sounds: Normal breath sounds. No wheezing.  Chest:     Chest wall: No tenderness.  Abdominal:     General: Bowel sounds are normal. There is no distension.     Palpations: Abdomen is soft.     Tenderness: There is no abdominal tenderness.  Musculoskeletal:        General: No tenderness or deformity.     Cervical back: Normal range of motion.  Skin:    General: Skin is warm and dry.  Neurological:     Mental Status: He is alert and oriented to person, place, and time.     (all labs ordered are listed, but only abnormal results are  displayed) Labs Reviewed  BASIC METABOLIC PANEL WITH GFR - Abnormal; Notable for the following components:      Result Value   BUN 59 (*)    Creatinine, Ser 9.87 (*)    Calcium  7.1 (*)    GFR, Estimated 6 (*)    Anion gap 19 (*)    All other components within normal limits  CBC WITH DIFFERENTIAL/PLATELET - Abnormal; Notable for the following components:   RBC 2.86 (*)    Hemoglobin 8.6 (*)    HCT 26.4 (*)    RDW 17.2 (*)    All other components within normal limits    EKG: None  Radiology: No results found.   Procedures   Medications Ordered in the ED - No data to display                                  Medical Decision Making   This patient presents to the ED for concern of hemodialysis, this involves a number of treatment options,  and is a complaint that carries with it a high risk of complications and morbidity.  The differential diagnosis includes electrolyte derangement versus fluid overload.    Co morbidities: Discussed in HPI   Brief History:  See HPI  EMR reviewed including pt PMHx, past surgical history and past visits to ER.   See HPI for more details   Lab Tests:  I ordered and independently interpreted labs.  The pertinent results include:    BMP with a creatinine is actually improved from last time.  CBC with no leukocytosis, hemoglobin is at his baseline.  Imaging Studies:  No imaging studies ordered for this patient  Medicines ordered:  N.A  Consults:  10:12 AM Spoke to Dr. Geralynn, nephrology who will place orders for patient to receive dialysis at this time.  Social Determinants of Health:  The patient's social determinants of health were a factor in the care of this patient  Problem List / ED Course:  Well-known patient to the emergency department here for dialysis, last dialyzed on Monday, he was seen in the ED then in order to have dialysis.  Blood work here is improved than his last visit.  I did contact nephrology Dr. Conda who will put in orders for patient to have dialysis at this time.  Patient did complain of wheezing however exam was normal.  Did not obtain any x-ray.  No signs of hypoxia or tachycardia.  The rest of his vitals are within normal limits.  Patient will need dialysis and likely disposition home.   Dispostion:  Patient will need dialysis and likely disposition home.  Portions of this note were generated with Scientist, clinical (histocompatibility and immunogenetics). Dictation errors may occur despite best attempts at proofreading.   Final diagnoses:  Encounter for hemodialysis    ED Discharge Orders     None          Pius Byrom, PA-C 06/09/24 1019  "

## 2024-06-11 ENCOUNTER — Other Ambulatory Visit: Payer: Self-pay

## 2024-06-11 ENCOUNTER — Encounter (HOSPITAL_COMMUNITY): Payer: Self-pay | Admitting: Emergency Medicine

## 2024-06-11 ENCOUNTER — Emergency Department (HOSPITAL_COMMUNITY)
Admission: EM | Admit: 2024-06-11 | Discharge: 2024-06-11 | Disposition: A | Discharge status: Home / Self Care | Source: Home / Self Care | Attending: Emergency Medicine | Admitting: Emergency Medicine

## 2024-06-11 DIAGNOSIS — N186 End stage renal disease: Secondary | ICD-10-CM

## 2024-06-11 LAB — COMPREHENSIVE METABOLIC PANEL WITH GFR
ALT: 16 U/L (ref 0–44)
AST: 15 U/L (ref 15–41)
Albumin: 4.4 g/dL (ref 3.5–5.0)
Alkaline Phosphatase: 87 U/L (ref 38–126)
Anion gap: 19 — ABNORMAL HIGH (ref 5–15)
BUN: 48 mg/dL — ABNORMAL HIGH (ref 6–20)
CO2: 23 mmol/L (ref 22–32)
Calcium: 7.3 mg/dL — ABNORMAL LOW (ref 8.9–10.3)
Chloride: 98 mmol/L (ref 98–111)
Creatinine, Ser: 9.5 mg/dL — ABNORMAL HIGH (ref 0.61–1.24)
GFR, Estimated: 6 mL/min — ABNORMAL LOW
Glucose, Bld: 84 mg/dL (ref 70–99)
Potassium: 4.2 mmol/L (ref 3.5–5.1)
Sodium: 139 mmol/L (ref 135–145)
Total Bilirubin: 0.5 mg/dL (ref 0.0–1.2)
Total Protein: 7.9 g/dL (ref 6.5–8.1)

## 2024-06-11 LAB — CBC WITH DIFFERENTIAL/PLATELET
Abs Immature Granulocytes: 0.05 10*3/uL (ref 0.00–0.07)
Basophils Absolute: 0.1 10*3/uL (ref 0.0–0.1)
Basophils Relative: 1 %
Eosinophils Absolute: 0.2 10*3/uL (ref 0.0–0.5)
Eosinophils Relative: 2 %
HCT: 27.3 % — ABNORMAL LOW (ref 39.0–52.0)
Hemoglobin: 8.8 g/dL — ABNORMAL LOW (ref 13.0–17.0)
Immature Granulocytes: 1 %
Lymphocytes Relative: 15 %
Lymphs Abs: 1.2 10*3/uL (ref 0.7–4.0)
MCH: 29.9 pg (ref 26.0–34.0)
MCHC: 32.2 g/dL (ref 30.0–36.0)
MCV: 92.9 fL (ref 80.0–100.0)
Monocytes Absolute: 0.7 10*3/uL (ref 0.1–1.0)
Monocytes Relative: 9 %
Neutro Abs: 5.9 10*3/uL (ref 1.7–7.7)
Neutrophils Relative %: 72 %
Platelets: 271 10*3/uL (ref 150–400)
RBC: 2.94 MIL/uL — ABNORMAL LOW (ref 4.22–5.81)
RDW: 17.2 % — ABNORMAL HIGH (ref 11.5–15.5)
WBC: 8.1 10*3/uL (ref 4.0–10.5)
nRBC: 0 % (ref 0.0–0.2)

## 2024-06-11 MED ORDER — CHLORHEXIDINE GLUCONATE CLOTH 2 % EX PADS: 6.0000 | MEDICATED_PAD | Freq: Every day | CUTANEOUS | Status: DC

## 2024-06-11 MED ORDER — AEROCHAMBER PLUS FLO-VU MEDIUM MISC
1.0000 | Freq: Once | Status: AC
  Administered 2024-06-11: 1

## 2024-06-11 MED ORDER — HEPARIN SODIUM (PORCINE) 1000 UNIT/ML IJ SOLN
INTRAMUSCULAR | Status: AC
  Filled 2024-06-11: qty 3

## 2024-06-11 MED ORDER — ALBUTEROL SULFATE HFA 108 (90 BASE) MCG/ACT IN AERS
1.0000 | INHALATION_SPRAY | RESPIRATORY_TRACT | Status: DC | PRN
  Administered 2024-06-11: 2 via RESPIRATORY_TRACT
  Filled 2024-06-11: qty 6.7

## 2024-06-11 MED ORDER — HEPARIN SODIUM (PORCINE) 1000 UNIT/ML DIALYSIS: 1000.0000 [IU] | INTRAMUSCULAR | Status: DC | PRN

## 2024-06-11 NOTE — Discharge Instructions (Signed)
 Please take your meds as prescribed.   Please go to dialysis as scheduled   Return to ER if you have chest pain or shortness of breath

## 2024-06-11 NOTE — ED Triage Notes (Signed)
 Patient here for dialysis.  Last had dialysis on Wednesday.  Patient also requesting another inhaler because he left his the other day.

## 2024-06-11 NOTE — Progress Notes (Addendum)
 Met with pt while pt receiving HD. Pt provided navigator a letter that he would like provided to [facility] provider. Nephrologist and renal NP that is here today made aware that navigator has this letter and pt's request for letter to be given to [facility] provider. Pt also provided information on how social security can obtain a copy of pt's 2728 form from pt's previous clinic. Pt continues to not have an out-pt clinic. Navigator to make referral to [facility] once clinic is able to take pt's insurance. Will assist as needed.   Randine Mungo Dialysis Navigator [phone]  Addendum at 3:52 pm: Per pt's request, letter from pt provided to [facility] provider.

## 2024-06-11 NOTE — ED Provider Notes (Signed)
 " [place] EMERGENCY DEPARTMENT AT [facility] Provider Note   CSN: [id number] Arrival date & time: 06/11/24  9373     Patient presents with: Needs Dialysis   Paul [name] is a 59 y.o. male.   Pt is a 59 yo male with pmhx significant for ESRD on HD, HTN, schizophrenia, CHF, OA, CRAO, and bipolar d/o.  He comes to the ED for his HD sessions as he does not currently have an outpatient center.  He said he's been feeling ok, but has some wheezing.  He lost his albuterol  inhaler and asks for another one.       Prior to Admission medications  Medication Sig Start Date End Date Taking? Authorizing Provider  ARIPiprazole  ER (ABILIFY  MAINTENA) 400 MG PRSY prefilled syringe Inject 400 mg into the muscle every 28 (twenty-eight) days. 03/24/24   Plovsky, Elna, MD  carvedilol  (COREG ) 6.25 MG tablet Take 1 tablet (6.25 mg total) by mouth 2 (two) times daily with a meal. 01/13/24   Delbert Clam, MD  isosorbide  mononitrate (IMDUR ) 30 MG 24 hr tablet Take 1 tablet (30 mg total) by mouth daily. 04/28/24   Placey, Ronal Caldron, NP  oseltamivir  (TAMIFLU ) 30 MG capsule Take 1 capsule by mouth after your next two dialysis sessions Patient not taking: No sig reported 04/21/24   Randol Simmonds, MD  OVER THE COUNTER MEDICATION Take 1 capsule by mouth daily. [place] for memory    [provider]  rosuvastatin  (CRESTOR ) 5 MG tablet Take 1 tablet (5 mg total) by mouth daily. 03/24/23     sucroferric oxyhydroxide (VELPHORO ) 500 MG chewable tablet Chew 3 tablets (1,500 mg total) by mouth 3 (three) times daily with meals. Patient not taking: No sig reported 07/29/23   Tawkaliyar, Roya, DO    Allergies: Penicillin g    Review of Systems  Respiratory:  Positive for wheezing.   All other systems reviewed and are negative.   Updated Vital Signs BP (!) 149/94 (BP Location: Left Arm)   Pulse 79   Temp 97.7 F (36.5 C) (Oral)   Resp 16   Wt 88.5 kg   SpO2 100%   BMI 34.54 kg/m    Physical Exam Vitals and nursing note reviewed.  Constitutional:      Appearance: Normal appearance.  HENT:     Head: Normocephalic and atraumatic.     Right Ear: External ear normal.     Left Ear: External ear normal.     Nose: Nose normal.     Mouth/Throat:     Mouth: Mucous membranes are moist.  Eyes:     Comments: R eye blind  Cardiovascular:     Rate and Rhythm: Normal rate and regular rhythm.     Pulses: Normal pulses.     Heart sounds: Normal heart sounds.  Pulmonary:     Effort: Pulmonary effort is normal.     Breath sounds: Normal breath sounds.  Abdominal:     General: Abdomen is flat. Bowel sounds are normal.     Palpations: Abdomen is soft.  Musculoskeletal:        General: Normal range of motion.     Cervical back: Normal range of motion and neck supple.  Skin:    General: Skin is warm.     Capillary Refill: Capillary refill takes less than 2 seconds.  Neurological:     General: No focal deficit present.     Mental Status: He is alert and oriented to person, place,  and time.  Psychiatric:        Mood and Affect: Mood normal.        Behavior: Behavior normal.     (all labs ordered are listed, but only abnormal results are displayed) Labs Reviewed  CBC WITH DIFFERENTIAL/PLATELET - Abnormal; Notable for the following components:      Result Value   RBC 2.94 (*)    Hemoglobin 8.8 (*)    HCT 27.3 (*)    RDW 17.2 (*)    All other components within normal limits  COMPREHENSIVE METABOLIC PANEL WITH GFR - Abnormal; Notable for the following components:   BUN 48 (*)    Creatinine, Ser 9.50 (*)    Calcium  7.3 (*)    GFR, Estimated 6 (*)    Anion gap 19 (*)    All other components within normal limits    EKG: None  Radiology: No results found.   Procedures   Medications Ordered in the ED  albuterol  (VENTOLIN  HFA) 108 (90 Base) MCG/ACT inhaler 1-2 puff (has no administration in time range)  AeroChamber Plus Flo-Vu Medium MISC 1 each (has no  administration in time range)                                    Medical Decision Making Amount and/or Complexity of Data Reviewed Labs: ordered.  Risk Prescription drug management.   This patient presents to the ED for concern of needing dialysis, this involves an extensive number of treatment options, and is a complaint that carries with it a high risk of complications and morbidity.  The differential diagnosis includes dialysis   Co morbidities that complicate the patient evaluation  ESRD on HD, HTN, schizophrenia, CHF, OA, CRAO, and bipolar d/o   Additional history obtained:  Additional history obtained from epic chart review   Lab Tests:  I Ordered, and personally interpreted labs.  The pertinent results include:  cbc with hgb low at 8.8 (chronic) and cmp with nl k, bun 48 and cr 9.5 (chronic)  Medicines ordered and prescription drug management:  I have reviewed the patients home medicines and have made adjustments as needed  Consultations Obtained:  I requested consultation with the nephrologist (Dr. Geralynn),  and discussed lab and imaging findings as well as pertinent plan - he will take him to dialysis   Problem List / ED Course:  ESRD on HD:  pt will go to dialysis today.  He does not need admission, so can be d/c after dialysis. Wheezing:  pt given an inhaler  Social Determinants of Health:  Lives at home   Dispostion:  After consideration of the diagnostic results and the patients response to treatment, I feel that the patent would benefit from dialysis, then likely d/c.       Final diagnoses:  ESRD on hemodialysis [facility])    ED Discharge Orders     None          Dean Clarity, MD 06/11/24 [phone]  "

## 2024-06-11 NOTE — ED Notes (Signed)
 Transported to dialysis.

## 2024-06-22 ENCOUNTER — Ambulatory Visit (HOSPITAL_COMMUNITY)

## 2024-06-24 ENCOUNTER — Ambulatory Visit: Admitting: Internal Medicine

## 2024-07-06 ENCOUNTER — Ambulatory Visit (HOSPITAL_COMMUNITY): Admitting: Psychiatry

## 2024-07-27 ENCOUNTER — Ambulatory Visit: Payer: Self-pay | Admitting: Internal Medicine
